# Patient Record
Sex: Female | Born: 1940
Health system: Southern US, Community
[De-identification: ages and names within clinical notes are randomized; demographics above are authoritative.]

## PROBLEM LIST (undated history)

## (undated) DIAGNOSIS — C4490 Unspecified malignant neoplasm of skin, unspecified: Secondary | ICD-10-CM

## (undated) DIAGNOSIS — M899 Disorder of bone, unspecified: Secondary | ICD-10-CM

## (undated) DIAGNOSIS — E669 Obesity, unspecified: Secondary | ICD-10-CM

## (undated) DIAGNOSIS — G459 Transient cerebral ischemic attack, unspecified: Secondary | ICD-10-CM

## (undated) DIAGNOSIS — E785 Hyperlipidemia, unspecified: Secondary | ICD-10-CM

## (undated) DIAGNOSIS — K5730 Diverticulosis of large intestine without perforation or abscess without bleeding: Secondary | ICD-10-CM

## (undated) DIAGNOSIS — K760 Fatty (change of) liver, not elsewhere classified: Secondary | ICD-10-CM

## (undated) DIAGNOSIS — K7689 Other specified diseases of liver: Secondary | ICD-10-CM

## (undated) DIAGNOSIS — F411 Generalized anxiety disorder: Secondary | ICD-10-CM

## (undated) DIAGNOSIS — I639 Cerebral infarction, unspecified: Secondary | ICD-10-CM

## (undated) DIAGNOSIS — R42 Dizziness and giddiness: Secondary | ICD-10-CM

## (undated) DIAGNOSIS — M949 Disorder of cartilage, unspecified: Secondary | ICD-10-CM

## (undated) DIAGNOSIS — M797 Fibromyalgia: Secondary | ICD-10-CM

## (undated) DIAGNOSIS — I2510 Atherosclerotic heart disease of native coronary artery without angina pectoris: Secondary | ICD-10-CM

## (undated) DIAGNOSIS — I10 Essential (primary) hypertension: Secondary | ICD-10-CM

## (undated) HISTORY — DX: Obesity, unspecified: E66.9

## (undated) HISTORY — DX: Hyperlipidemia, unspecified: E78.5

## (undated) HISTORY — DX: Atherosclerotic heart disease of native coronary artery without angina pectoris: I25.10

## (undated) HISTORY — PX: TOTAL ABDOMINAL HYSTERECTOMY W/ BILATERAL SALPINGOOPHORECTOMY: SHX83

## (undated) HISTORY — PX: BACK SURGERY: SHX140

## (undated) HISTORY — DX: Disorder of cartilage, unspecified: M94.9

## (undated) HISTORY — DX: Dizziness and giddiness: R42

## (undated) HISTORY — PX: MOHS SURGERY: SUR867

## (undated) HISTORY — DX: Unspecified malignant neoplasm of skin, unspecified: C44.90

## (undated) HISTORY — DX: Other specified diseases of liver: K76.89

## (undated) HISTORY — DX: Diverticulosis of large intestine without perforation or abscess without bleeding: K57.30

## (undated) HISTORY — DX: Generalized anxiety disorder: F41.1

## (undated) HISTORY — DX: Cerebral infarction, unspecified: I63.9

## (undated) HISTORY — DX: Fatty (change of) liver, not elsewhere classified: K76.0

## (undated) HISTORY — DX: Essential (primary) hypertension: I10

## (undated) HISTORY — DX: Disorder of bone, unspecified: M89.9

## (undated) HISTORY — DX: Fibromyalgia: M79.7

## (undated) HISTORY — DX: Transient cerebral ischemic attack, unspecified: G45.9

## (undated) HISTORY — PX: TONSILLECTOMY AND ADENOIDECTOMY: SUR1326

## (undated) HISTORY — PX: CHOLECYSTECTOMY: SHX55

---

## 1986-02-13 ENCOUNTER — Encounter: Payer: Self-pay | Admitting: Internal Medicine

## 1989-09-25 ENCOUNTER — Encounter: Payer: Self-pay | Admitting: Internal Medicine

## 1999-03-11 ENCOUNTER — Emergency Department (HOSPITAL_COMMUNITY): Admission: EM | Admit: 1999-03-11 | Discharge: 1999-03-11 | Payer: Self-pay | Admitting: Emergency Medicine

## 2003-07-31 ENCOUNTER — Ambulatory Visit (HOSPITAL_COMMUNITY): Admission: RE | Admit: 2003-07-31 | Discharge: 2003-07-31 | Payer: Self-pay | Admitting: *Deleted

## 2003-08-21 ENCOUNTER — Other Ambulatory Visit: Admission: RE | Admit: 2003-08-21 | Discharge: 2003-08-21 | Payer: Self-pay | Admitting: *Deleted

## 2004-06-09 ENCOUNTER — Ambulatory Visit: Payer: Self-pay | Admitting: Pulmonary Disease

## 2005-03-11 ENCOUNTER — Ambulatory Visit: Payer: Self-pay | Admitting: Pulmonary Disease

## 2005-03-30 ENCOUNTER — Ambulatory Visit: Payer: Self-pay | Admitting: Pulmonary Disease

## 2005-06-02 ENCOUNTER — Ambulatory Visit: Payer: Self-pay | Admitting: Pulmonary Disease

## 2006-01-25 ENCOUNTER — Ambulatory Visit: Payer: Self-pay | Admitting: Pulmonary Disease

## 2006-04-07 ENCOUNTER — Encounter: Payer: Self-pay | Admitting: Internal Medicine

## 2006-04-07 ENCOUNTER — Ambulatory Visit: Payer: Self-pay | Admitting: Internal Medicine

## 2006-04-07 ENCOUNTER — Observation Stay (HOSPITAL_COMMUNITY): Admission: EM | Admit: 2006-04-07 | Discharge: 2006-04-09 | Payer: Self-pay | Admitting: *Deleted

## 2006-04-08 ENCOUNTER — Encounter: Payer: Self-pay | Admitting: Vascular Surgery

## 2006-04-10 ENCOUNTER — Ambulatory Visit: Payer: Self-pay

## 2006-04-25 ENCOUNTER — Ambulatory Visit: Payer: Self-pay | Admitting: *Deleted

## 2006-05-03 ENCOUNTER — Ambulatory Visit: Payer: Self-pay | Admitting: Internal Medicine

## 2006-05-08 ENCOUNTER — Ambulatory Visit (HOSPITAL_COMMUNITY): Admission: RE | Admit: 2006-05-08 | Discharge: 2006-05-09 | Payer: Self-pay | Admitting: Internal Medicine

## 2006-05-08 ENCOUNTER — Ambulatory Visit: Payer: Self-pay | Admitting: Internal Medicine

## 2006-05-18 ENCOUNTER — Ambulatory Visit: Payer: Self-pay | Admitting: Internal Medicine

## 2006-06-14 ENCOUNTER — Ambulatory Visit: Payer: Self-pay | Admitting: Cardiology

## 2006-06-14 ENCOUNTER — Observation Stay (HOSPITAL_COMMUNITY): Admission: EM | Admit: 2006-06-14 | Discharge: 2006-06-15 | Payer: Self-pay | Admitting: Emergency Medicine

## 2006-06-21 ENCOUNTER — Ambulatory Visit: Payer: Self-pay | Admitting: Pulmonary Disease

## 2006-07-16 ENCOUNTER — Encounter: Admission: RE | Admit: 2006-07-16 | Discharge: 2006-07-16 | Payer: Self-pay | Admitting: Ophthalmology

## 2006-07-31 ENCOUNTER — Ambulatory Visit: Payer: Self-pay | Admitting: Internal Medicine

## 2006-09-25 ENCOUNTER — Observation Stay (HOSPITAL_COMMUNITY): Admission: EM | Admit: 2006-09-25 | Discharge: 2006-09-27 | Payer: Self-pay | Admitting: Emergency Medicine

## 2006-09-25 ENCOUNTER — Ambulatory Visit: Payer: Self-pay | Admitting: Cardiology

## 2006-09-27 ENCOUNTER — Emergency Department (HOSPITAL_COMMUNITY): Admission: EM | Admit: 2006-09-27 | Discharge: 2006-09-28 | Payer: Self-pay | Admitting: Emergency Medicine

## 2006-09-28 ENCOUNTER — Ambulatory Visit: Payer: Self-pay | Admitting: Pulmonary Disease

## 2006-11-27 ENCOUNTER — Ambulatory Visit: Payer: Self-pay | Admitting: Pulmonary Disease

## 2007-07-30 ENCOUNTER — Ambulatory Visit: Payer: Self-pay | Admitting: Pulmonary Disease

## 2007-07-30 DIAGNOSIS — F411 Generalized anxiety disorder: Secondary | ICD-10-CM | POA: Insufficient documentation

## 2007-07-30 DIAGNOSIS — E785 Hyperlipidemia, unspecified: Secondary | ICD-10-CM | POA: Insufficient documentation

## 2007-09-07 ENCOUNTER — Ambulatory Visit: Payer: Self-pay | Admitting: Pulmonary Disease

## 2007-09-07 DIAGNOSIS — M899 Disorder of bone, unspecified: Secondary | ICD-10-CM | POA: Insufficient documentation

## 2007-09-07 DIAGNOSIS — M949 Disorder of cartilage, unspecified: Secondary | ICD-10-CM

## 2007-09-08 DIAGNOSIS — R55 Syncope and collapse: Secondary | ICD-10-CM | POA: Insufficient documentation

## 2007-09-08 DIAGNOSIS — E669 Obesity, unspecified: Secondary | ICD-10-CM | POA: Insufficient documentation

## 2007-09-08 LAB — CONVERTED CEMR LAB
ALT: 52 units/L — ABNORMAL HIGH (ref 0–35)
Bilirubin, Direct: 0.1 mg/dL (ref 0.0–0.3)
Calcium: 9.3 mg/dL (ref 8.4–10.5)
Eosinophils Absolute: 0.1 10*3/uL (ref 0.0–0.6)
Eosinophils Relative: 2 % (ref 0.0–5.0)
GFR calc Af Amer: 92 mL/min
GFR calc non Af Amer: 76 mL/min
Glucose, Bld: 116 mg/dL — ABNORMAL HIGH (ref 70–99)
HDL: 41.1 mg/dL (ref >39.0)
Lymphocytes Relative: 27.5 % (ref 12.0–46.0)
MCV: 86.1 fL (ref 78.0–100.0)
Monocytes Relative: 7 % (ref 3.0–11.0)
Neutro Abs: 3.3 10*3/uL (ref 1.4–7.7)
Platelets: 211 10*3/uL (ref 150–400)
Potassium: 3.8 meq/L (ref 3.5–5.1)
TSH: 3.01 microintl units/mL (ref 0.35–5.50)
Triglycerides: 74 mg/dL (ref 0–149)
WBC: 5.3 10*3/uL (ref 4.5–10.5)

## 2007-09-10 ENCOUNTER — Encounter: Payer: Self-pay | Admitting: Pulmonary Disease

## 2007-09-10 ENCOUNTER — Ambulatory Visit: Payer: Self-pay | Admitting: Internal Medicine

## 2007-09-18 ENCOUNTER — Telehealth (INDEPENDENT_AMBULATORY_CARE_PROVIDER_SITE_OTHER): Payer: Self-pay | Admitting: *Deleted

## 2007-10-22 ENCOUNTER — Ambulatory Visit: Payer: Self-pay | Admitting: Internal Medicine

## 2007-11-05 ENCOUNTER — Ambulatory Visit: Payer: Self-pay | Admitting: Internal Medicine

## 2007-12-27 ENCOUNTER — Ambulatory Visit: Payer: Self-pay | Admitting: Internal Medicine

## 2008-03-03 ENCOUNTER — Telehealth (INDEPENDENT_AMBULATORY_CARE_PROVIDER_SITE_OTHER): Payer: Self-pay | Admitting: *Deleted

## 2008-03-19 ENCOUNTER — Ambulatory Visit: Payer: Self-pay | Admitting: Pulmonary Disease

## 2008-03-19 ENCOUNTER — Ambulatory Visit (HOSPITAL_COMMUNITY): Admission: RE | Admit: 2008-03-19 | Discharge: 2008-03-19 | Payer: Self-pay | Admitting: Pulmonary Disease

## 2008-03-19 ENCOUNTER — Telehealth: Payer: Self-pay | Admitting: Pulmonary Disease

## 2008-03-19 ENCOUNTER — Encounter: Payer: Self-pay | Admitting: Pulmonary Disease

## 2008-03-19 ENCOUNTER — Ambulatory Visit: Payer: Self-pay | Admitting: Vascular Surgery

## 2008-03-26 ENCOUNTER — Ambulatory Visit: Payer: Self-pay | Admitting: Pulmonary Disease

## 2008-04-28 ENCOUNTER — Ambulatory Visit: Payer: Self-pay | Admitting: Internal Medicine

## 2008-04-28 LAB — CONVERTED CEMR LAB
ALT: 42 units/L — ABNORMAL HIGH (ref 0–35)
Albumin: 3.8 g/dL (ref 3.5–5.2)
BUN: 13 mg/dL (ref 6–23)
Basophils Absolute: 0 10*3/uL (ref 0.0–0.1)
Basophils Relative: 0.3 % (ref 0.0–3.0)
Calcium: 8.9 mg/dL (ref 8.4–10.5)
Cholesterol: 99 mg/dL (ref 0–200)
Creatinine, Ser: 0.7 mg/dL (ref 0.4–1.2)
Eosinophils Absolute: 0.1 10*3/uL (ref 0.0–0.7)
Eosinophils Relative: 1.8 % (ref 0.0–5.0)
GFR calc non Af Amer: 89 mL/min
HCT: 41 % (ref 36.0–46.0)
HDL: 30.3 mg/dL — ABNORMAL LOW (ref >39.0)
Hemoglobin: 14 g/dL (ref 12.0–15.0)
MCHC: 34.2 g/dL (ref 30.0–36.0)
MCV: 85.7 fL (ref 78.0–100.0)
Neutro Abs: 2.9 10*3/uL (ref 1.4–7.7)
RBC: 4.78 M/uL (ref 3.87–5.11)
Total Bilirubin: 0.9 mg/dL (ref 0.3–1.2)
Triglycerides: 74 mg/dL (ref 0–149)
aPTT: 30.1 s — ABNORMAL HIGH (ref 21.7–29.8)

## 2008-04-29 ENCOUNTER — Ambulatory Visit (HOSPITAL_COMMUNITY): Admission: RE | Admit: 2008-04-29 | Discharge: 2008-04-29 | Payer: Self-pay | Admitting: Internal Medicine

## 2008-04-29 ENCOUNTER — Ambulatory Visit: Payer: Self-pay | Admitting: Internal Medicine

## 2008-05-21 ENCOUNTER — Ambulatory Visit: Payer: Self-pay | Admitting: Internal Medicine

## 2008-05-21 ENCOUNTER — Observation Stay (HOSPITAL_COMMUNITY): Admission: AD | Admit: 2008-05-21 | Discharge: 2008-05-22 | Payer: Self-pay | Admitting: Internal Medicine

## 2008-05-21 ENCOUNTER — Ambulatory Visit: Payer: Self-pay

## 2008-05-26 ENCOUNTER — Telehealth: Payer: Self-pay | Admitting: Pulmonary Disease

## 2008-05-30 ENCOUNTER — Telehealth: Payer: Self-pay | Admitting: Pulmonary Disease

## 2008-06-10 ENCOUNTER — Ambulatory Visit: Payer: Self-pay | Admitting: Internal Medicine

## 2008-06-10 ENCOUNTER — Telehealth (INDEPENDENT_AMBULATORY_CARE_PROVIDER_SITE_OTHER): Payer: Self-pay | Admitting: *Deleted

## 2008-06-18 ENCOUNTER — Ambulatory Visit: Payer: Self-pay | Admitting: Pulmonary Disease

## 2008-06-18 ENCOUNTER — Ambulatory Visit: Payer: Self-pay | Admitting: Internal Medicine

## 2008-07-22 ENCOUNTER — Telehealth (INDEPENDENT_AMBULATORY_CARE_PROVIDER_SITE_OTHER): Payer: Self-pay | Admitting: *Deleted

## 2008-07-24 ENCOUNTER — Encounter: Payer: Self-pay | Admitting: Adult Health

## 2008-07-24 ENCOUNTER — Ambulatory Visit: Payer: Self-pay | Admitting: Internal Medicine

## 2008-07-30 ENCOUNTER — Encounter: Payer: Self-pay | Admitting: Pulmonary Disease

## 2008-08-20 ENCOUNTER — Telehealth: Payer: Self-pay | Admitting: Pulmonary Disease

## 2008-11-06 ENCOUNTER — Ambulatory Visit: Payer: Self-pay | Admitting: Pulmonary Disease

## 2008-11-10 ENCOUNTER — Telehealth (INDEPENDENT_AMBULATORY_CARE_PROVIDER_SITE_OTHER): Payer: Self-pay | Admitting: *Deleted

## 2008-11-12 ENCOUNTER — Encounter: Admission: RE | Admit: 2008-11-12 | Discharge: 2008-11-12 | Payer: Self-pay | Admitting: Pulmonary Disease

## 2008-11-26 ENCOUNTER — Encounter (INDEPENDENT_AMBULATORY_CARE_PROVIDER_SITE_OTHER): Payer: Self-pay | Admitting: *Deleted

## 2008-12-15 ENCOUNTER — Ambulatory Visit: Payer: Self-pay | Admitting: Pulmonary Disease

## 2008-12-15 LAB — CONVERTED CEMR LAB
Albumin: 3.9 g/dL (ref 3.5–5.2)
BUN: 12 mg/dL (ref 6–23)
Basophils Absolute: 0 10*3/uL (ref 0.0–0.1)
CO2: 32 meq/L (ref 19–32)
Cholesterol: 141 mg/dL (ref 0–200)
Eosinophils Absolute: 0.1 10*3/uL (ref 0.0–0.7)
GFR calc non Af Amer: 75.85 mL/min (ref >60)
Glucose, Bld: 98 mg/dL (ref 70–99)
HCT: 41.9 % (ref 36.0–46.0)
HDL: 33.7 mg/dL — ABNORMAL LOW (ref >39.00)
Hemoglobin: 14.5 g/dL (ref 12.0–15.0)
Lymphs Abs: 1.3 10*3/uL (ref 0.7–4.0)
MCHC: 34.6 g/dL (ref 30.0–36.0)
Monocytes Absolute: 0.3 10*3/uL (ref 0.1–1.0)
Monocytes Relative: 6.7 % (ref 3.0–12.0)
Neutro Abs: 2.5 10*3/uL (ref 1.4–7.7)
Platelets: 177 10*3/uL (ref 150.0–400.0)
Potassium: 3.9 meq/L (ref 3.5–5.1)
Pro B Natriuretic peptide (BNP): 22 pg/mL (ref 0.0–100.0)
RDW: 12.9 % (ref 11.5–14.6)
Sodium: 144 meq/L (ref 135–145)
TSH: 3.71 microintl units/mL (ref 0.35–5.50)
Total Bilirubin: 1 mg/dL (ref 0.3–1.2)
VLDL: 18 mg/dL (ref 0.0–40.0)

## 2008-12-18 ENCOUNTER — Ambulatory Visit: Payer: Self-pay | Admitting: Internal Medicine

## 2009-01-05 ENCOUNTER — Encounter: Admission: RE | Admit: 2009-01-05 | Discharge: 2009-01-05 | Payer: Self-pay | Admitting: Otolaryngology

## 2009-01-06 LAB — CONVERTED CEMR LAB: Vit D, 25-Hydroxy: 24 ng/mL — ABNORMAL LOW (ref 30–89)

## 2009-02-16 ENCOUNTER — Ambulatory Visit: Payer: Self-pay | Admitting: Pulmonary Disease

## 2009-02-16 LAB — CONVERTED CEMR LAB
Bilirubin Urine: NEGATIVE
Hemoglobin, Urine: NEGATIVE
Nitrite: NEGATIVE
Total Protein, Urine: NEGATIVE mg/dL
Urobilinogen, UA: 0.2 (ref 0.0–1.0)

## 2009-02-17 ENCOUNTER — Encounter: Payer: Self-pay | Admitting: Adult Health

## 2009-02-18 ENCOUNTER — Telehealth: Payer: Self-pay | Admitting: Adult Health

## 2009-02-19 ENCOUNTER — Ambulatory Visit: Payer: Self-pay | Admitting: Adult Health

## 2009-02-19 ENCOUNTER — Ambulatory Visit (HOSPITAL_BASED_OUTPATIENT_CLINIC_OR_DEPARTMENT_OTHER): Admission: RE | Admit: 2009-02-19 | Discharge: 2009-02-19 | Payer: Self-pay | Admitting: Pulmonary Disease

## 2009-02-19 ENCOUNTER — Ambulatory Visit: Payer: Self-pay | Admitting: Diagnostic Radiology

## 2009-02-20 ENCOUNTER — Telehealth: Payer: Self-pay | Admitting: Adult Health

## 2009-02-20 LAB — CONVERTED CEMR LAB
ALT: 56 U/L — ABNORMAL HIGH
AST: 91 U/L — ABNORMAL HIGH
Albumin: 4.3 g/dL
Alkaline Phosphatase: 130 U/L — ABNORMAL HIGH
BUN: 11 mg/dL
Basophils Absolute: 0 K/uL
Basophils Relative: 0 %
Bilirubin, Direct: 0.1 mg/dL
CO2: 26 meq/L
Calcium: 9 mg/dL
Chloride: 107 meq/L
Creatinine, Ser: 0.75 mg/dL
Eosinophils Absolute: 0.1 K/uL
Eosinophils Relative: 2 %
Glucose, Bld: 88 mg/dL
HCT: 43.5 %
Hemoglobin: 14.2 g/dL
Indirect Bilirubin: 0.5 mg/dL
Lymphocytes Relative: 30 %
Lymphs Abs: 1.8 K/uL
MCHC: 32.6 g/dL
MCV: 90.2 fL
Monocytes Absolute: 0.4 K/uL
Monocytes Relative: 7 %
Neutro Abs: 3.5 K/uL
Neutrophils Relative %: 60 %
Platelets: 219 K/uL
Potassium: 4.3 meq/L
RBC: 4.82 M/uL
RDW: 13.6 %
Sed Rate: 22 mm/h
Sodium: 144 meq/L
Total Bilirubin: 0.6 mg/dL
Total Protein: 7 g/dL
WBC: 5.9 10*3/microliter

## 2009-02-24 ENCOUNTER — Ambulatory Visit (HOSPITAL_COMMUNITY): Admission: RE | Admit: 2009-02-24 | Discharge: 2009-02-24 | Payer: Self-pay | Admitting: Pulmonary Disease

## 2009-04-06 ENCOUNTER — Encounter: Payer: Self-pay | Admitting: Pulmonary Disease

## 2009-04-20 ENCOUNTER — Encounter: Payer: Self-pay | Admitting: Adult Health

## 2009-04-20 ENCOUNTER — Ambulatory Visit: Payer: Self-pay

## 2009-04-20 ENCOUNTER — Ambulatory Visit: Payer: Self-pay | Admitting: Pulmonary Disease

## 2009-04-21 ENCOUNTER — Encounter: Payer: Self-pay | Admitting: Adult Health

## 2009-06-17 ENCOUNTER — Encounter: Payer: Self-pay | Admitting: Pulmonary Disease

## 2009-06-17 ENCOUNTER — Ambulatory Visit: Payer: Self-pay | Admitting: Pulmonary Disease

## 2009-06-20 DIAGNOSIS — K7581 Nonalcoholic steatohepatitis (NASH): Secondary | ICD-10-CM | POA: Insufficient documentation

## 2009-06-20 DIAGNOSIS — K5730 Diverticulosis of large intestine without perforation or abscess without bleeding: Secondary | ICD-10-CM | POA: Insufficient documentation

## 2009-06-20 DIAGNOSIS — K7689 Other specified diseases of liver: Secondary | ICD-10-CM

## 2009-06-23 LAB — CONVERTED CEMR LAB
Albumin: 4 g/dL (ref 3.5–5.2)
BUN: 19 mg/dL (ref 6–23)
CO2: 31 meq/L (ref 19–32)
Calcium: 9.5 mg/dL (ref 8.4–10.5)
Cholesterol: 279 mg/dL — ABNORMAL HIGH (ref 0–200)
Creatinine, Ser: 0.8 mg/dL (ref 0.4–1.2)
Direct LDL: 208 mg/dL
Glucose, Bld: 118 mg/dL — ABNORMAL HIGH (ref 70–99)
HDL: 32.9 mg/dL — ABNORMAL LOW (ref >39.00)
Total Protein: 7.5 g/dL (ref 6.0–8.3)
Triglycerides: 178 mg/dL — ABNORMAL HIGH (ref 0.0–149.0)

## 2009-08-12 ENCOUNTER — Telehealth (INDEPENDENT_AMBULATORY_CARE_PROVIDER_SITE_OTHER): Payer: Self-pay | Admitting: *Deleted

## 2009-12-14 ENCOUNTER — Ambulatory Visit: Payer: Self-pay | Admitting: Pulmonary Disease

## 2009-12-16 ENCOUNTER — Ambulatory Visit: Payer: Self-pay | Admitting: Internal Medicine

## 2009-12-16 ENCOUNTER — Encounter: Payer: Self-pay | Admitting: Pulmonary Disease

## 2009-12-30 LAB — CONVERTED CEMR LAB
AST: 119 units/L — ABNORMAL HIGH (ref 0–37)
Alkaline Phosphatase: 120 units/L — ABNORMAL HIGH (ref 39–117)
BUN: 17 mg/dL (ref 6–23)
Basophils Relative: 0.3 % (ref 0.0–3.0)
Bilirubin, Direct: 0.2 mg/dL (ref 0.0–0.3)
Cholesterol: 154 mg/dL (ref 0–200)
Eosinophils Relative: 2.2 % (ref 0.0–5.0)
GFR calc non Af Amer: 70.51 mL/min (ref >60)
HCT: 43.3 % (ref 36.0–46.0)
LDL Cholesterol: 96 mg/dL (ref 0–99)
Lymphs Abs: 1.5 10*3/uL (ref 0.7–4.0)
MCV: 92.8 fL (ref 78.0–100.0)
Monocytes Absolute: 0.3 10*3/uL (ref 0.1–1.0)
Platelets: 186 10*3/uL (ref 150.0–400.0)
Potassium: 4.6 meq/L (ref 3.5–5.1)
Sodium: 142 meq/L (ref 135–145)
Total Bilirubin: 0.9 mg/dL (ref 0.3–1.2)
Total CHOL/HDL Ratio: 4
WBC: 5.1 10*3/uL (ref 4.5–10.5)

## 2009-12-31 ENCOUNTER — Telehealth: Payer: Self-pay | Admitting: Pulmonary Disease

## 2010-01-07 ENCOUNTER — Ambulatory Visit: Payer: Self-pay | Admitting: Cardiology

## 2010-03-24 ENCOUNTER — Encounter: Payer: Self-pay | Admitting: Pulmonary Disease

## 2010-06-15 ENCOUNTER — Ambulatory Visit: Payer: Self-pay | Admitting: Pulmonary Disease

## 2010-06-22 LAB — CONVERTED CEMR LAB
AST: 53 units/L — ABNORMAL HIGH (ref 0–37)
Alkaline Phosphatase: 115 units/L (ref 39–117)
Calcium: 9 mg/dL (ref 8.4–10.5)
Cholesterol: 270 mg/dL — ABNORMAL HIGH (ref 0–200)
GFR calc non Af Amer: 78.92 mL/min (ref >60.00)
HDL: 38.5 mg/dL — ABNORMAL LOW (ref >39.00)
Potassium: 4.3 meq/L (ref 3.5–5.1)
Sodium: 142 meq/L (ref 135–145)
Total Bilirubin: 0.8 mg/dL (ref 0.3–1.2)
Total CHOL/HDL Ratio: 7
VLDL: 29 mg/dL (ref 0.0–40.0)

## 2010-06-24 ENCOUNTER — Ambulatory Visit: Admission: RE | Admit: 2010-06-24 | Discharge: 2010-06-24 | Payer: Self-pay | Source: Home / Self Care

## 2010-06-29 ENCOUNTER — Telehealth (INDEPENDENT_AMBULATORY_CARE_PROVIDER_SITE_OTHER): Payer: Self-pay | Admitting: *Deleted

## 2010-07-12 ENCOUNTER — Encounter: Payer: Self-pay | Admitting: Pulmonary Disease

## 2010-07-20 ENCOUNTER — Other Ambulatory Visit: Payer: Self-pay | Admitting: Pulmonary Disease

## 2010-07-20 ENCOUNTER — Ambulatory Visit: Admission: RE | Admit: 2010-07-20 | Discharge: 2010-07-20 | Payer: Self-pay | Source: Home / Self Care

## 2010-07-20 LAB — HEPATIC FUNCTION PANEL
Albumin: 3.8 g/dL (ref 3.5–5.2)
Total Protein: 6.6 g/dL (ref 6.0–8.3)

## 2010-07-20 LAB — LIPID PANEL
Cholesterol: 210 mg/dL — ABNORMAL HIGH (ref 0–200)
HDL: 37.1 mg/dL — ABNORMAL LOW (ref >39.00)
Triglycerides: 190 mg/dL — ABNORMAL HIGH (ref 0.0–149.0)
VLDL: 38 mg/dL (ref 0.0–40.0)

## 2010-07-20 LAB — LDL CHOLESTEROL, DIRECT: Direct LDL: 152.4 mg/dL

## 2010-07-20 NOTE — Progress Notes (Signed)
Summary: [facility]   Imported By: Sherian Rein 03/01/2010 09:48:16  _____________________________________________________________________  External Attachment:    Type:   Image     Comment:   External Document

## 2010-07-20 NOTE — Miscellaneous (Signed)
Summary: BONE DENSITY  Clinical Lists Changes  Orders: Added new Test order of T-Bone Densitometry (77080) - Signed Added new Test order of T-Lumbar Vertebral Assessment (77082) - Signed 

## 2010-07-20 NOTE — Miscellaneous (Signed)
Summary: Fluv[facility]in/[facility]  [facility]   Imported By: Lester [place] 03/30/2010 08:50:08  _____________________________________________________________________  External Attachment:    Type:   Image     Comment:   External Document

## 2010-07-20 NOTE — Progress Notes (Signed)
Summary: lab work  Phone Note Call from Patient Call back at [employer] Daughter of pt   Caller: Daughter-[name] Call For: Dr. Kriste Basque Summary of Call: [phone] [name]-pt's daughter Called pt to give her appt for lipid clinic of 02/10/10 at 12:00. Daughter got on the phone wanting to know pt's lab information and had questions about her moms diagnois of Fatty Liver Disease and reason for lipid referral. Daughter is requesting a call from nurse.  Initial call taken by: Alfonso Ramus,  December 31, 2009 1:12 PM  Follow-up for Phone Call        called and spoke with Tanya--pts daughter---she had questions about pts lab tests and pt had told her that they were going to do a liver biopsy.  SN did a phonetree message and this is what they pt told her daughter they wanted to do.  SN did not say anything to me about this when he asked me to call pt to discuss the lipid clinic referral.  Tanya is aware that pt is to follow up with GI and the lipid clinic for dosing of her simvastatin 80mg   1/2 tab at bedtime. Randell Loop CMA  December 31, 2009 2:19 PM

## 2010-07-20 NOTE — Assessment & Plan Note (Signed)
Summary: rov 6 months///kp   CC:  6 month ROV & review of mult medical problems....  History of Present Illness: 70 y/o WF here for a follow up visit... she has mult med problems as noted below... Followed for general medical purposes w/ hx HBP, CAD & hx Syncope followed by Tanya, Hyperlipidemia, Obesity, Fatty Liver disease, LBP/ FM/ Osteopenia, & anxiety...   ~  June 17, 2009:  c/o scratchy throat & wants ZPak,  nose bleeds intermittently & we discussed nasal saline lavage, some left heel pain- needs heel cup, warm soaks, etc... tripped in Nov w/ scrape & mild cellulitis Rx'd w/ Keflex, ven dopplers were neg... she stopped her Simva80 in Sep due to ?abd pain & due for f/u FLP today- may need Lipid Clinic referral for Rx.   ~  December 14, 2009:  she reports a good 6 months- no new complaints or concerns... BP controlled on Lasix & she denies CP, palpit, SOB, edema, etc... she notes dizziness if she tilts her head back "it's cardio-depressor dizziness" she says- no syncope... she decided to re-start her Simva80 & never went to the Lipid Clinic> FLP improved on this but LFT's elevated (FLD) & she will need assessment in the clinic + f/u w/ GI...    Current Problems:   HYPERTENSION (ICD-401.9) - off Norvasc & Diltiazem, on LASIX 20mg /d, K55mEqBid...BP=140/80 today and she denies HA, fatigue, visual changes, CP, dyspnea, edema, etc... she notes occas palpitations...   CORONARY ARTERY DISEASE (ICD-414.00) - hx non-obstructive CAD followed by [name]... she denies CP, palpit, ch in SOB, edema, etc...  ~  cath 1996 showed 20-30% lesions in the Circ & RCA, EF=60%...  ~  NuclearStressTest 10/07 showed no scar or ischemia, ?EF=37%?...  ~  2DEcho 10/07 showed no regional wall motion changes, EF=60-65%... valves OK, some DD noted.  ~  repeat cath 12/09 showed similar findings w/ nonobstructive plaque, EF= 65%...  Hx of SYNCOPE (ICD-780.2) - hosp in 2007 for syncope and no etiology  discovered... implanted loop recorder placed for 18 months w/o events, then 2 spells after the recorder was removed!!!... rehosp 4/08 w/ neg studies at that time too... repeat eval 11/09 & nothing found...   ~  CDopplers 10/07 showed mild plaque, 0-39% bilat ICA...  ~  neg TiltTable study 4/08...  ~  6/10: notes occas palpit, dizzy- uses Meclizine Prn... saw Tanya 7/10- rec diet + exerc.  HYPERLIPIDEMIA (ICD-272.4) - on SIMVASTATIN 80mg /d- she takes it on & off> asked to stop Simva80 & f/u w/ Lipid Clinic... we discussed diet + exercise as well...  ~  FLP 3/09 showed TChol 139, TG 74, HDL 41, LDL 83... continue med + diet therapy...   ~  FLP 11/09 showed TChol 99, TG 74, HDL 30, LDL 54  ~  FLP 6/10 on Simva80 showed TChol 141, TG 90, HDL 34, LDL 89... note: LFT's sl elevated...  ~  FLP 12/10 off Simva showed TChol 279, TG 178, HDL 33, LDL 208... rec> refer to Lipid Clinic (she never went).  ~  FLP 6/11 back on Simva80 showed TChol 154, TG 107, HDL 37, LDL 96... needs LC eval to ch med.  OBESITY (ICD-278.00) - We discussed diet + exercise program... she states that [name] says you can gain weight while hardly eating any food & she thinks she isn't eating enough food...   ~  weight 12/09 = 212#  ~  weight 6/10 = 219#  ~  weight 12/10 = 217#  ~  weight 6/11 = 215#  DIVERTICULOSIS OF COLON (ICD-562.10) - she needs f/u eval [name]- ?when was last colonoscopy?  ~  CT Abd 9/10  done for Abd pain- showed divertics & hep steatosis, retained stool & DJD sp...  FATTY LIVER DISEASE (ICD-571.8) - hx elevated LFT's x yrs and eval [name] in past w/ rec to diet/ exerc/ weight reduction...  ~  labs 6/10 showed SGOT= 89, SGPT= 65  ~  labs 12/10 showed SGOT= 65, SGPT= 54  ~  labs 6/11 showed SGOT= 119, SGPT= 76... needs re-eval by GI  LOW BACK PAIN, CHRONIC (ICD-724.2) - s/p lumbar surg in 1989 by [name]...  FIBROMYALGIA (ICD-729.1)  OSTEOPENIA (ICD-733.90) - ?prev BMD from GYN... on Calcium,  MVI, VitD...  ~  BMD here 3/09 showed TScores -0.7 in Spine, and -1.6 in left FemNeck  ~  6/11:  f/u BMD due & we will sched.  DIZZINESS (ICD-780.4) - she takes MECLIZINE 25mg  Bid...  ~  s/p ENT eval by [name] 2/08 felt to be benign positional vertigo, Rx w/ meclizine...   ~  she cancelled a planned neuro evaluation 4/08...  ANXIETY (ICD-300.00) - on XANAX 0.5mg Tid (takes 1AM & 2HS)...  SKIN CANCER - s/o Moh's surg for BCCa left cheek...  HEALTH MAINT--- GYN= prev [name] and needs new Gyn now... mammograms at [name]... had neg FlexSig 1991 DrBrodie and needs routine colonoscopy scheduled... states she uses olive oil on her dry skin per [name]... had Pneumovax 2008, & 2010 Flu shot in Oct.   Preventive Screening-Counseling & Management  Alcohol-Tobacco     Smoking Status: never  Current Medications (verified): 1)  Simply Saline 0.9 % Aers (Saline) .... Spray in Nose Every 1-2 Hours As Directed... 2)  Mucinex Maximum Strength 1200 Mg Xr12h-Tab (Guaifenesin) .... Take 1 Tab By Mouth Two Times A Day W/ Plenty of Fluids.Marland KitchenMarland Kitchen 3)  Bayer Aspirin 325 Mg  Tabs (Aspirin) .... Take 1 Tablet By Mouth Once A Day 4)  Lasix 20 Mg  Tabs (Furosemide) .... Take 1 Tablet By Mouth Once A Day 5)  Klor-Con M20 20 Meq Cr-Tabs (Potassium Chloride Crys Cr) .... Take One Tablet By Mouth Two Times A Day 6)  Zocor 80 Mg  Tabs (Simvastatin) .... Take 1 Tablet By Mouth At Bedtime.Marland KitchenMarland Kitchen 7)  Fish Oil 1000 Mg Caps (Omega-3 Fatty Acids) .... Take 1 Cap By Mouth Once Daily.Marland KitchenMarland Kitchen 8)  Levsin 0.125 Mg Tabs (Hyoscyamine Sulfate) .Marland Kitchen.. 1 Three Times A Day As Needed Abdominal Cramping 9)  Caltrate 600+d Plus 600-400 Mg-Unit Tabs (Calcium Carbonate-Vit D-Min) .... Take 1 Tab By Mouth Once Daily... 10)  Womens Multivitamin Plus  Tabs (Multiple Vitamins-Minerals) .... Take 1 Tab Daily... 11)  Vitamin D 1000 Unit Tabs (Cholecalciferol) .... Take 1 Tablet By Mouth Once A Day 12)  Alprazolam 0.5 Mg  Tabs (Alprazolam) .... Take 1/2 To 1  Tablet By Mouth Three Times A Day As Needed For Nerves... Not To Exceed 3 Tabs Per Day.  Allergies: 1)  ! Sulfa 2)  ! Codeine 3)  ! Cardizem Cd (Diltiazem Hcl Coated Beads)  Past History:  Past Medical History: HYPERTENSION (ICD-401.9) CORONARY ARTERY DISEASE (ICD-414.00) Hx of SYNCOPE (ICD-780.2) HYPERLIPIDEMIA (ICD-272.4) OBESITY (ICD-278.00) DIVERTICULOSIS OF COLON (ICD-562.10) FATTY LIVER DISEASE (ICD-571.8) LOW BACK PAIN, CHRONIC (ICD-724.2) FIBROMYALGIA (ICD-729.1) OSTEOPENIA (ICD-733.90) DIZZINESS (ICD-780.4) ANXIETY (ICD-300.00)  Past Surgical History: S/P T & A S/P hysterectomy & BSO S/P cholecystectomy - 4/91 by Hollace Kinnier S/P back surgery - L5 S1 diskectomy & foraminotomy 2/89 by [name] S/P  hand surgery for Dupytren's contracture S/P Moh's surg for basal cell on left cheek 2/10 DrLeschin  Family History: Reviewed history from 12/17/2008 and no changes required. mother deceased age 64 from stomach cancer father deceased from pneumonia--pt was only 3 when father passed--unsure of his age at the time Brother passed age 93 -Merkel cell carcinoma 1 sister alive hx of breast cancer, stomach cancer, uterine cancer 1 sister alive hx of melanoma  Social History: Reviewed history from 12/17/2008 and no changes required. never smoked never drinks exposed to second hand smoke exercise---walks 1 times per week caffenine use--1 cup per day married x 53 years 4 children  Review of Systems      See HPI       The patient complains of dyspnea on exertion and difficulty walking.  The patient denies anorexia, fever, weight loss, weight gain, vision loss, decreased hearing, hoarseness, chest pain, syncope, peripheral edema, prolonged cough, headaches, hemoptysis, abdominal pain, melena, hematochezia, severe indigestion/heartburn, hematuria, incontinence, muscle weakness, suspicious skin lesions, transient blindness, depression, unusual weight change, abnormal bleeding,  enlarged lymph nodes, and angioedema.    Vital Signs:  Patient profile:   70 year old female Height:      63 inches Weight:      215 pounds BMI:     38.22 Temp:     97.3 degrees F oral Pulse rate:   76 / minute BP sitting:   140 / 80  Vitals Entered By: Kandice Hams CMA (December 14, 2009 10:50 AM) CC: 6 month ROV & review of mult medical problems...   Physical Exam  Additional Exam:   WD, Obese, 70 y/o WF in NAD... GENERAL:  Alert & oriented; pleasant & cooperative... HEENT:  [place]/AT, EOM-wnl,  PERRLA, EACs- wax, TMs-wnl, NOSE-pale, THROAT-clear & wnl. NECK:  Supple w/ fairROM; no JVD; normal carotid impulses w/o bruits; no thyromegaly or nodules palpated; no lymphadenopathy. CHEST:  Clear BS bilaterally without wheezing, rales, or signs of consolidation... HEART:  Regular Rhythm; without murmurs/ rubs/ or gallops heard... ABDOMEN:  Soft & nontender; normal bowel sounds; no organomegaly or masses detected. EXT: without deformities, mild arthritic changes; no varicose veins/ +venous insuffic/ tr edema... NEURO:  CN's intact, no focal neuro deficits... DERM:  scar on left cheek, no lesions noted...    MISC. Report  Procedure date:  12/14/2009  Findings:      BMP (METABOL)   Sodium                    142 mEq/L                   135-145   Potassium                 4.6 mEq/L                   3.5-5.1   Chloride                  105 mEq/L                   96-112   Carbon Dioxide            29 mEq/L                    19-32   Glucose              [H]  103 mg/dL  70-99   BUN                       17 mg/dL                    1-30   Creatinine                0.9 mg/dL                   8.6-5.7   Calcium                   9.2 mg/dL                   8.4-69.6   GFR                       70.51 mL/min                >60  Hepatic/Liver Function Panel (HEPATIC)   Total Bilirubin           0.9 mg/dL                   2.9-5.2   Direct Bilirubin          0.2 mg/dL                    8.4-1.3   Alkaline Phosphatase [H]  120 U/L                     39-117   AST                  [H]  119 U/L                     0-37   ALT                  [H]  76 U/L                      0-35   Total Protein             7.1 g/dL                    2.4-4.0   Albumin                   4.2 g/dL                    1.0-2.7  CBC Platelet w/Diff (CBCD)   White Cell Count          5.1 K/uL                    4.5-10.5   Red Cell Count            4.66 Mil/uL                 3.87-5.11   Hemoglobin           [H]  15.1 g/dL                   25.3-66.4   Hematocrit                43.3 %  36.0-46.0   MCV                       92.8 fl                     78.0-100.0   Platelet Count            186.0 K/uL                  150.0-400.0  Comments:      Lipid Panel (LIPID)   Cholesterol               154 mg/dL                   0-102   Triglycerides             107.0 mg/dL                 7.2-536.6   HDL                  [L]  44.03 mg/dL                 >47.42   LDL Cholesterol           96 mg/dL                    5-95      TSH (TSH)   FastTSH                   3.27 uIU/mL                 0.35-5.50   Impression & Recommendations:  Problem # 1:  HYPERTENSION (ICD-401.9) Controlled on med>  continue same. Her updated medication list for this problem includes:    Lasix 20 Mg Tabs (Furosemide) .Marland Kitchen... Take 1 tablet by mouth once a day  Orders: TLB-BMP (Basic Metabolic Panel-BMET) (80048-METABOL) TLB-Hepatic/Liver Function Pnl (80076-HEPATIC) TLB-CBC Platelet - w/Differential (85025-CBCD) TLB-Lipid Panel (80061-LIPID) TLB-TSH (Thyroid Stimulating Hormone) (84443-TSH)  Problem # 2:  CORONARY ARTERY DISEASE (ICD-414.00) Followed for Cards by Tanya>  stable w/o angina... Her updated medication list for this problem includes:    Bayer Aspirin 325 Mg Tabs (Aspirin) .Marland Kitchen... Take 1 tablet by mouth once a day    Lasix 20 Mg Tabs (Furosemide) .Marland Kitchen... Take 1 tablet by mouth once a  day  Problem # 3:  Hx of SYNCOPE (ICD-780.2) No recurrent episodes but she describes dizzy w/ bending her head back...   Problem # 4:  HYPERLIPIDEMIA (ICD-272.4) Back on simva80 & Chol is improved... elev LFT's from fatty liver dis... needs eval Lipid Clinic for alternative Rx. Her updated medication list for this problem includes:    Zocor 80 Mg Tabs (Simvastatin) .Marland Kitchen... Take 1 tablet by mouth at bedtime...  Problem # 5:  OBESITY (ICD-278.00) We discussed diet exercise get weight down!!!  Problem # 6:  FATTY LIVER DISEASE (ICD-571.8) Her numbers are worse... hasn't lost any weight... needs GI f/u and discussion of prognosis if she doesn't lose weight w/ this condition.  Problem # 7:  ANXIETY (ICD-300.00) Stable on Alpraz... Her updated medication list for this problem includes:    Alprazolam 0.5 Mg Tabs (Alprazolam) .Marland Kitchen... Take 1/2 to 1 tablet by mouth three times a day as needed for nerves... not to exceed 3 tabs per day.  Problem # 8:  OTHER MEDICAL PROBLEMS AS NOTED>>>  Complete Medication  List: 1)  Simply Saline 0.9 % Aers (Saline) .... Spray in nose every 1-2 hours as directed... 2)  Mucinex Maximum Strength 1200 Mg Xr12h-tab (Guaifenesin) .... Take 1 tab by mouth two times a day w/ plenty of fluids.Marland KitchenMarland Kitchen 3)  Bayer Aspirin 325 Mg Tabs (Aspirin) .... Take 1 tablet by mouth once a day 4)  Lasix 20 Mg Tabs (Furosemide) .... Take 1 tablet by mouth once a day 5)  Klor-con M20 20 Meq Cr-tabs (Potassium chloride crys cr) .... Take one tablet by mouth two times a day 6)  Zocor 80 Mg Tabs (Simvastatin) .... Take 1 tablet by mouth at bedtime.Marland KitchenMarland Kitchen 7)  Fish Oil 1000 Mg Caps (Omega-3 fatty acids) .... Take 1 cap by mouth once daily.Marland KitchenMarland Kitchen 8)  Levsin 0.125 Mg Tabs (Hyoscyamine sulfate) .Marland Kitchen.. 1 three times a day as needed abdominal cramping 9)  Caltrate 600+d Plus 600-400 Mg-unit Tabs (Calcium carbonate-vit d-min) .... Take 1 tab by mouth once daily... 10)  Womens Multivitamin Plus Tabs (Multiple  vitamins-minerals) .... Take 1 tab daily... 11)  Vitamin D 1000 Unit Tabs (Cholecalciferol) .... Take 1 tablet by mouth once a day 12)  Alprazolam 0.5 Mg Tabs (Alprazolam) .... Take 1/2 to 1 tablet by mouth three times a day as needed for nerves... not to exceed 3 tabs per day.  Other Orders: Gastroenterology Referral (GI)  Patient Instructions: 1)  Today we updated your med list- see below.... 2)  Continue your current meds the same... 3)  Today we refilled your Xanax per request... 4)  We also did your follow up FASTING blood work... please call the "phone tree" in a few days for your lab results.Marland KitchenMarland Kitchen 5)  We will arrange for a follow up Bone Density test as well- & we will call you w/ these results when avail.Marland KitchenMarland Kitchen 6)  Let's get on track w/ our diet & exercise program... 7)  Call for any problems... 8)  Please schedule a follow-up appointment in 6 months. Prescriptions: ALPRAZOLAM 0.5 MG  TABS (ALPRAZOLAM) take 1/2 to 1 tablet by mouth three times a day as needed for nerves... not to exceed 3 tabs per day.  #100 x 6   Entered and Authorized by:   Michele Mcalpine MD   Signed by:   Michele Mcalpine MD on 12/14/2009   Method used:   Print then Give to Patient   RxID:   [id number]    Orders Added: 1)  Est. Patient Level IV [14782] 2)  TLB-BMP (Basic Metabolic Panel-BMET) [80048-METABOL] 3)  TLB-Hepatic/Liver Function Pnl [80076-HEPATIC] 4)  TLB-CBC Platelet - w/Differential [85025-CBCD] 5)  TLB-Lipid Panel [80061-LIPID] 6)  TLB-TSH (Thyroid Stimulating Hormone) [84443-TSH] 7)  Gastroenterology Referral [GI]   Current Allergies (reviewed today): ! SULFA ! CODEINE ! CARDIZEM CD (DILTIAZEM HCL COATED BEADS)

## 2010-07-20 NOTE — Progress Notes (Signed)
Summary: rx req/ sinus/ congestion  Phone Note Call from Patient Call back at [facility] Phone [phone]   Caller: Patient Call For: nadel Summary of Call: pt c/o drainage in throat/ hoarse x 1 day. temp today is 100. also has chills/ sinus congestion. had stomach virus/ diarrhea/ vomiting 2 wks ago but is over that now. using musinex and ibuprophen. wants rx called in to [facility].  Initial call taken by: Tivis Ringer, CNA,  August 12, 2009 11:29 AM  Follow-up for Phone Call        Pt c/o dry cough, PND, hoarseness, temp of 100.2, chills, nasal and chest cogestion all x 2 days. Pt has been using mucinex and ibuprofen. Please advise. Carron Curie CMA  August 12, 2009 11:35 AM allergies: sulfa, codeine, cardizem  Additional Follow-up for Phone Call Additional follow up Details #1::        per SN---ok for pt to have zpak #1  take as directed and mmw  #4oz  1 tsp gargle and swallow four times daily.  thanks Randell Loop CMA  August 12, 2009 3:31 PM   rx's sent to pt's pharmacy.  pt is aware, encouraged her to call if she does not improve.  pt verbalized her understanding. Boone Master CNA  August 12, 2009 3:43 PM     New/Updated Medications: ZITHROMAX Z-PAK 250 MG TABS (AZITHROMYCIN) take as directed * MAGIC MOUTHWASH swish and swallow 1tsp four times a day Prescriptions: MAGIC MOUTHWASH swish and swallow 1tsp four times a day  #4oz x 0   Entered by:   Boone Master CNA   Authorized by:   Michele Mcalpine MD   Signed by:   Boone Master CNA on 08/12/2009   Method used:   Faxed to ...       [facility]* (retail)       [address] [place], [place]  [address]       Ph: [phone]       Fax: [phone]   RxID:   [id number] Christena Deem Z-PAK 250 MG TABS (AZITHROMYCIN) take as directed  #1 x 0   Entered by:   Boone Master CNA   Authorized by:   Michele Mcalpine MD   Signed by:   Boone Master CNA on 08/12/2009   Method  used:   Electronically to        [facility]* (retail)       [address] [place], [place]  [address]       Ph: [phone]       Fax: [phone]   RxID:   [id number]

## 2010-07-20 NOTE — Progress Notes (Signed)
Summary: [facility]   Imported By: Sherian Rein 03/01/2010 09:46:51  _____________________________________________________________________  External Attachment:    Type:   Image     Comment:   External Document

## 2010-07-20 NOTE — Assessment & Plan Note (Signed)
Summary: new to lipid/hyperlipidemia   Visit Type:  Initial Consult Primary Provider:  Kriste Basque  CC:  dyslipidemia; increased LFTs .  History of Present Illness:  Lipid Clinic Visit      Mrs. Greenstein presents today for initial dyslipidemia visit for elevated LFTs.  Patient is currently taking Fish Oil 1000mg  daily and simvastatin 40mg .  She was previousy on simvastatin 80mg , however her LFTs were elevated in June 2011 and she was decreased to simvastatin 40mg  daily.  She has a history of fluctuations in her LFTs as well as a history of fatty liver disease.  In September 2010, her simvastatin was held due to increased LFTs, and her LDL increased to 208 in December 2010.  She was not taking simvastatin for 3 months at this time and her LFTs did improve.  She denies any muscle pains or aches associated with the simvastatin, however, she also has fibromyalgia.  She is mostly following a heart healthy diet.  For breakfast she typically has cheerios with 2% milk.  She eats a total of three eggs per week and she will have toast with them.  For lunch, she typically eats steamed vegetables or a salad.  For dinner, she typically eats vegetables, including starches.  She does not eat a lot of meat because her husband is a vegetarian.  He is also diabetic, so she tries to maintain a healthy diet for his benefit as well.  She drinks tea and water, and will occassionally have a sprite or ginger ale.  She snacks occassionally and will have a pack of crackers with cheese.  She denies tobacco or alcohol use.  Review of exercise habits reveals that the patient is walking 30 minutes per day 3 times a week. She is going to the beach next week, but has plans to begin participating in the [employer] program after she returns.   Patient states that she has struggled with her weight.  Last year, she lost 22 pounds, but has since gained it back.     Lipid Management Provider  Weston Brass, PharmD and Dillard Cannon, PharmD  candidate  Current Medications (verified): 1)  Simply Saline 0.9 % Aers (Saline) .... Spray in Nose Every 1-2 Hours As Directed... 2)  Bayer Aspirin 325 Mg  Tabs (Aspirin) .... Take 1 Tablet By Mouth Once A Day 3)  Lasix 20 Mg  Tabs (Furosemide) .... Take 1 Tablet By Mouth Once A Day 4)  Klor-Con M20 20 Meq Cr-Tabs (Potassium Chloride Crys Cr) .... Take One Tablet By Mouth Two Times A Day 5)  Zocor 80 Mg  Tabs (Simvastatin) .... Take 1/2  Tablet By Mouth At Bedtime.Marland KitchenMarland Kitchen 6)  Fish Oil 1000 Mg Caps (Omega-3 Fatty Acids) .... Take 1 Cap By Mouth Once Daily.Marland KitchenMarland Kitchen 7)  Caltrate 600+d Plus 600-400 Mg-Unit Tabs (Calcium Carbonate-Vit D-Min) .... Take 1 Tab By Mouth Once Daily.Marland KitchenMarland Kitchen 8)  Womens Multivitamin Plus  Tabs (Multiple Vitamins-Minerals) .... Take 1 Tab Daily.Marland KitchenMarland Kitchen 9)  Alprazolam 0.5 Mg  Tabs (Alprazolam) .... Take 1/2 To 1 Tablet By Mouth Three Times A Day As Needed For Nerves... Not To Exceed 3 Tabs Per Day.  Allergies (verified): 1)  ! Sulfa 2)  ! Codeine 3)  ! Cardizem Cd (Diltiazem Hcl Coated Beads)  Past History:  Past Medical History: Last updated: 12/14/2009 HYPERTENSION (ICD-401.9) CORONARY ARTERY DISEASE (ICD-414.00) Hx of SYNCOPE (ICD-780.2) HYPERLIPIDEMIA (ICD-272.4) OBESITY (ICD-278.00) DIVERTICULOSIS OF COLON (ICD-562.10) FATTY LIVER DISEASE (ICD-571.8) LOW BACK PAIN, CHRONIC (ICD-724.2) FIBROMYALGIA (ICD-729.1) OSTEOPENIA (ICD-733.90) DIZZINESS (  ICD-780.4) ANXIETY (ICD-300.00)  Family History: Last updated: 2010-01-17 mother deceased age 43 from stomach cancer father deceased from pneumonia--pt was only 3 when father passed--unsure of his age at the time Brother passed age 21 -Merkel cell carcinoma 1 sister alive hx of breast cancer, stomach cancer, uterine cancer 1 sister alive hx of melanoma  Family History: mother deceased age 59 from stomach cancer father deceased from pneumonia--pt was only 3 when father passed--unsure of his age at the time Brother passed age 64  -Merkel cell carcinoma 1 sister alive hx of breast cancer, stomach cancer, uterine cancer 1 sister alive hx of melanoma   Vital Signs:  Patient profile:   70 year old female Height:      63 inches Weight:      217 pounds BMI:     38.58 BP sitting:   128 / 80  (right arm)  Impression & Recommendations:  Problem # 1:  HYPERLIPIDEMIA (ICD-272.4) Patient's lipid levels are as follows: TC 154 (at goal<200) TG 107 (at goal<150) HDL 36.5 (below goal>40) LDL 96 (above goal <70)  AST 119 (above goal<37) ALT 76 (above goal<35)  Patient's TC and TG levels are at goal and her LDL is slightly elevated.  She appears to be following a heart healthy diet overall and was encouraged to continue this.  She expressed interest in the [employer] program and is to start this after she returns from the beach in approximately two weeks. Since LFTs are  ~3x WLN, we will hold simvastatin for 1 month and check her liver function panel.  If her LFTs have returned to normal or baseline for the pt, we will try Pravastatin 80mg  daily and re-check her liver function panel after one month of this therapy.  If LFTs are elevated after trial of second statin, will need to consider alternate drug class for treatment.  We will follow up with patient in 1 month to assess liver function panel.     Her updated medication list for this problem includes:    Zocor 80 Mg Tabs (Simvastatin) .Marland Kitchen... Take 1/2  tablet by mouth at bedtime...  Patient Instructions: 1)  Stop Simvastatin 2)  Try to keep eating a low-fat, low-cholesterol diet 3)  Start [employer] exercise program 4)  We will recheck your labs in 1 month 5)  Lab Appt: 02/08/10 at 8:45 am (fasting) 6)  Lipid Clinic Appt: 02/11/10 at 11:00

## 2010-07-22 ENCOUNTER — Ambulatory Visit: Admit: 2010-07-22 | Payer: Self-pay

## 2010-07-22 ENCOUNTER — Encounter: Payer: Self-pay | Admitting: Cardiology

## 2010-07-22 ENCOUNTER — Ambulatory Visit (INDEPENDENT_AMBULATORY_CARE_PROVIDER_SITE_OTHER): Payer: Medicare Other

## 2010-07-22 DIAGNOSIS — Z79899 Other long term (current) drug therapy: Secondary | ICD-10-CM

## 2010-07-22 DIAGNOSIS — E7800 Pure hypercholesterolemia, unspecified: Secondary | ICD-10-CM

## 2010-07-22 NOTE — Assessment & Plan Note (Signed)
Summary: follow-up per Dr Nadel/ewj       [name] presents today for dyslipidemia follow-up.  She was first seen last July after having elevated  LFTs with simvastatin.   We stopped the statin at that time and planned f/u for the next month.  Unfortunately, she did not return to clinic until today.  She has only been taking fish oil 1gm since last visit. She does state that she feels much better since stopping simvastatin.          She is mostly following a heart healthy diet.  For breakfast she typically has cheerios or frosted flakes with 2% milk or oatmeal.  Lunch is sometimes leftovers from the night before or egg sandwiches.  For dinner, she typically eats vegetables, including starches.  She does not eat a lot of meat because her husband is a vegetarian.  He is also diabetic, so she tries to maintain a healthy diet for his benefit as well.  They do eat fish once a week but it is fried flounder.  She snacks occassionally and will have a pack of crackers with cheese.   She has started incorporating more apples and oranges in as snacks.  She denies tobacco or alcohol use.          Review of exercise habits reveals that the patient is walking 3 [place] a [place] the mall.  She tries to do 3 loops every time she goes.  She has also started [employer] program but is not happy with it.  She states they will only let them do exercises with 1 lb weights while sitting in a chair.  She is looking for alternative programs to do with a few of her friends.   Lipid Management Provider  Weston Brass, PharmD   Current Medications (verified): 1)  Bayer Aspirin 325 Mg  Tabs (Aspirin) .... Take 1 Tablet By Mouth Once A Day 2)  Lasix 20 Mg  Tabs (Furosemide) .... Take 1 Tablet By Mouth Once A Day 3)  Klor-Con M20 20 Meq Cr-Tabs (Potassium Chloride Crys Cr) .... Take One Tablet By Mouth Two Times A Day 4)  Fish Oil 1000 Mg Caps (Omega-3 Fatty Acids) .... Take 1 Cap By Mouth Once Daily.Marland KitchenMarland Kitchen 5)  Caltrate 600+d Plus  600-400 Mg-Unit Tabs (Calcium Carbonate-Vit D-Min) .... Take 1 Tab By Mouth Once Daily.Marland KitchenMarland Kitchen 6)  Womens Multivitamin Plus  Tabs (Multiple Vitamins-Minerals) .... Take 1 Tab Daily.Marland KitchenMarland Kitchen 7)  Alprazolam 0.5 Mg  Tabs (Alprazolam) .... Take 1/2 To 1 Tablet By Mouth Three Times A Day As Needed For Nerves... Not To Exceed 3 Tabs Per Day. 8)  Meclizine Hcl 25 Mg Tabs (Meclizine Hcl) .... Take 1/2 Tablet Up To 4 Times A Day As Needed For Dizziness 9)  Crestor 10 Mg Tabs (Rosuvastatin Calcium) .... Take One Tablet By Mouth Daily.  Allergies: 1)  ! Sulfa 2)  ! Codeine 3)  Cardizem Cd (Diltiazem Hcl Coated Beads)   Vital Signs:  Patient profile:   70 year old female Pulse rate:   72 / minute BP sitting:   125 / 85  Impression & Recommendations:  Problem # 1:  HYPERLIPIDEMIA (ICD-272.4) Assessment Deteriorated Pt's cholesterol increased significantly without statin.  TC- 270 (goal<200), TG- 145 (goal<150), HDL-39 (goal>45), and LDL 045 (goal<100).  LFTs did return to normal since d/c simvastatin.  Discussed medication options with pt.  Will try Crestor 10mg  daily.  Pt was instructed to call with any signs of muscle pains or  aches.  Encouraged her to find a new source of exercise that will allow her to exert herself a little more.  Will recheck labs in 1 month.   Her updated medication list for this problem includes:    Crestor 10 Mg Tabs (Rosuvastatin calcium) .Marland Kitchen... Take one tablet by mouth daily.  Patient Instructions: 1)  Start new medication: Crestor 10mg  once daily 2)  Continue all other medications 3)  Continue to eat low-fat diet 4)  Try to work out with heavier weights as much as possible.  5)  Lab Appt 07/19/10 at [facility] office (fasting) 6)  Lipid Clinic Appt: 07/22/10 at 10am

## 2010-07-22 NOTE — Progress Notes (Signed)
Summary: prescription  Phone Note Call from Patient   Caller: Patient Call For: dr. Kriste Basque Summary of Call: Patient saw Dr. Kriste Basque in December and her prescriptions were faxed to [facility] but [facility] will not refill her xanax and she would like the xanax sent to [facility]. She went to pick it up and they stated that we sent the note back stating that the patient just had a refill for 220 but she never received them because [facility] will not send xanax through the mail. Patient would like these called in today if possible. Patient can be reached [phone] Initial call taken by: Vedia Coffer,  June 29, 2010 11:49 AM  Follow-up for Phone Call        Spoke with Velna Hatchet at [facility] and she verified that rx for Xanax was not sent to pt because her Medicare plan does not cover this drug.  Spoke with pt and she is aware that she weill have to pay for Xanax at pharmacy.  Refill was sent to [facility]. Abigail Miyamoto RN  June 29, 2010 12:18 PM     Prescriptions: ALPRAZOLAM 0.5 MG  TABS (ALPRAZOLAM) take 1/2 to 1 tablet by mouth three times a day as needed for nerves... not to exceed 3 tabs per day.  #100 x 5   Entered by:   Abigail Miyamoto RN   Authorized by:   Michele Mcalpine MD   Signed by:   Abigail Miyamoto RN on 06/29/2010   Method used:   Telephoned to ...       [facility]* (retail)       [address] [place], [place]  [address]       Ph: [phone]       Fax: [phone]   RxID:   [id number]

## 2010-07-22 NOTE — Assessment & Plan Note (Signed)
Summary: 6 months/apc   Primary Care Provider:  Kriste Basque  CC:  6 month ROV & review of mult medical problems....  History of Present Illness: 70 y/o [name] here for a follow up visit... she has mult med problems as noted below... Followed for general medical purposes w/ hx HBP, CAD & hx Syncope followed by DrTaylor, Hyperlipidemia, Obesity, Fatty Liver disease, LBP/ FM/ Osteopenia, & anxiety...   ~  June 17, 2009:  c/o scratchy throat & wants ZPak,  nose bleeds intermittently & we discussed nasal saline lavage, some left heel pain- needs heel cup, warm soaks, etc... tripped in Nov w/ scrape & mild cellulitis Rx'd w/ Keflex, ven dopplers were neg... she stopped her Simva80 in Sep due to ?abd pain & due for f/u FLP today- may need Lipid Clinic referral for Rx.   ~  December 14, 2009:  she reports a good 6 months- no new complaints or concerns... BP controlled on Lasix & she denies CP, palpit, SOB, edema, etc... she notes dizziness if she tilts her head back "it's cardio-depressor dizziness" she says- no syncope... she decided to re-start her Simva80 & never went to the Lipid Clinic> FLP improved on this but LFT's elevated (FLD) & she will need assessment in the clinic + f/u w/ GI.Marland Kitchen.   ~  June 15, 2010:  18mo ROV- she seems conflicted> told the nurse she has no energy & tired all the time; tells me she feels good "best in 21yrs"... she stopped her Simva80 on her own & went to [facility] 7/11 but didn't return 8/11 as requested> FLP today shows LDL=212 & she is encouraged to ret to the clinic for help... BP stable & she denies CP, palpit, edema, etc;  requesting diff nasal spray & we will try Omnaris.    Current Problems:   ALLERGIC RHINITIS - she states that saline nasal spray dries her eyes out & wants diff inhaler> rec OMNARIS 2sp Qhs...  HYPERTENSION (ICD-401.9) - off Norvasc & Diltiazem, on LASIX 20mg /d, K38mEqBid...BP=120/72 today and she denies HA, fatigue, visual changes, CP, dyspnea, edema, etc...  she notes occas palpitations...   CORONARY ARTERY DISEASE (ICD-414.00) - hx non-obstructive CAD followed by DrTaylor Antony Haste... she denies CP, palpit, ch in SOB, edema, etc...  ~  cath 1996 showed 20-30% lesions in the Circ & RCA, EF=60%...  ~  NuclearStressTest 10/07 showed no scar or ischemia, ?EF=37%?...  ~  2DEcho 10/07 showed no regional wall motion changes, EF=60-65%... valves OK, some DD noted.  ~  repeat cath 12/09 showed similar findings w/ nonobstructive plaque, EF= 65%...  Hx of SYNCOPE (ICD-780.2) - hosp in 2007 for syncope and no etiology discovered... implanted loop recorder placed for 18 months w/o events, then 2 spells after the recorder was removed!!!... rehosp 4/08 w/ neg studies at that time too... repeat eval 11/09 & nothing found...   ~  CDopplers 10/07 showed mild plaque, 0-39% bilat ICA...  ~  neg TiltTable study 4/08...  ~  6/10: notes occas palpit, dizzy- uses Meclizine Prn... saw DrTaylor 7/10- rec diet + exerc.  HYPERLIPIDEMIA (ICD-272.4) - on SIMVASTATIN 80mg /d- she takes it on & off> asked to stop Simva80 & f/u w/ Lipid Clinic... we discussed diet + exercise as well...  ~  FLP 3/09 showed TChol 139, TG 74, HDL 41, LDL 83... continue med + diet therapy...   ~  FLP 11/09 showed TChol 99, TG 74, HDL 30, LDL 54  ~  FLP 6/10 on Simva80 showed TChol 141, TG  90, HDL 34, LDL 89... note: LFT's sl elevated...  ~  FLP 12/10 off Simva showed TChol 279, TG 178, HDL 33, LDL 208... rec> refer to Lipid Clinic (she never went).  ~  FLP 6/11 back on Simva80 showed TChol 154, TG 107, HDL 37, LDL 96... needs LC eval to ch med.  ~  FLP 12/11 off meds showed TChol 270, TG 145, HDL 39, LDL 213... rec to return to [facility] for on-going care.  OBESITY (ICD-278.00) - We discussed diet + exercise program... she states that DrOz says you can gain weight while hardly eating any food & she thinks she isn't eating enough food...   ~  weight 12/09 = 212#  ~  weight 6/10 = 219#  ~  weight 12/10 = 217#  ~   weight 6/11 = 215#  ~  weight 12/11 = 216#.Marland Kitchen we discussed diet + exercise.  DIVERTICULOSIS OF COLON (ICD-562.10) - she needs f/u eval DrDBrodie- ?when was last colonoscopy?  ~  CT Abd 9/10  done for Abd pain- showed divertics & hep steatosis, retained stool & DJD sp...  FATTY LIVER DISEASE (ICD-571.8) - hx elevated LFT's x yrs and eval DrDBrodie in past w/ rec to diet/ exerc/ weight reduction...  ~  labs 6/10 showed SGOT= 89, SGPT= 65  ~  labs 12/10 showed SGOT= 65, SGPT= 54  ~  labs 6/11 showed SGOT= 119, SGPT= 76... needs re-eval by GI  ~  labs 12/11 showed SGOT= 53, SGPT= 44  LOW BACK PAIN, CHRONIC (ICD-724.2) - s/p lumbar surg in 1989 by DrBotero...  FIBROMYALGIA (ICD-729.1)  OSTEOPENIA (ICD-733.90) - ?prev BMD from GYN... on Calcium, MVI, VitD...  ~  BMD here 3/09 showed TScores -0.7 in Spine, and -1.6 in left FemNeck  ~  6/11:  f/u BMD due & we will sched.  DIZZINESS (ICD-780.4) - she takes MECLIZINE 25mg  Bid...  ~  s/p ENT eval by Gayla Medicus 2/08 felt to be benign positional vertigo, Rx w/ meclizine...   ~  she cancelled a planned neuro evaluation 4/08...  ANXIETY (ICD-300.00) - on XANAX 0.5mg Tid (takes 1AM & 2HS)...  SKIN CANCER - s/o Moh's surg for BCCa left cheek...  HEALTH MAINT--- GYN= prev DrFore and needs new Gyn now... [facility]... had neg FlexSig 1991 DrBrodie and needs routine colonoscopy scheduled... states she uses olive oil on her dry skin per DrOz... had Pneumovax 2008, & 2010 Flu shot in Oct.   Preventive Screening-Counseling & Management  Alcohol-Tobacco     Smoking Status: never  Allergies: 1)  ! Sulfa 2)  ! Codeine 3)  ! Cardizem Cd (Diltiazem Hcl Coated Beads)  Comments:  Nurse/Medical Assistant: The patient's medications and allergies were reviewed with the patient and were updated in the Medication and Allergy Lists.  Past History:  Past Medical History: HYPERTENSION (ICD-401.9) CORONARY ARTERY DISEASE (ICD-414.00) Hx of  SYNCOPE (ICD-780.2) HYPERLIPIDEMIA (ICD-272.4) OBESITY (ICD-278.00) DIVERTICULOSIS OF COLON (ICD-562.10) FATTY LIVER DISEASE (ICD-571.8) LOW BACK PAIN, CHRONIC (ICD-724.2) FIBROMYALGIA (ICD-729.1) OSTEOPENIA (ICD-733.90) DIZZINESS (ICD-780.4) ANXIETY (ICD-300.00)  Past Surgical History: S/P T & A S/P hysterectomy & BSO S/P cholecystectomy - 4/91 by Hollace Kinnier S/P back surgery - L5 S1 diskectomy & foraminotomy 2/89 by DrBotero S/P hand surgery for Dupytren's contracture S/P Moh's surg for basal cell on left cheek 2/10 DrLeschin Loop recorder implanted Loop recorder explanted (nov 2009)  Family History: Reviewed history from 02/26/2010 and no changes required. mother deceased age 10 from stomach cancer father deceased from pneumonia--pt was only 3 when  father passed--unsure of his age at the time Brother passed age 29 -Merkel cell carcinoma 1 sister alive hx of breast cancer, stomach cancer, uterine cancer 1 sister alive hx of melanoma Family History of Diabetes: mother No FH of Colon Cancer:  Social History: Reviewed history from 12/17/2008 and no changes required. never smoked never drinks exposed to second hand smoke exercise---walks 1 times per week caffenine use--1 cup per day married x 53 years 4 children  Review of Systems      See HPI       The patient complains of dyspnea on exertion.  The patient denies anorexia, fever, weight loss, weight gain, vision loss, decreased hearing, hoarseness, chest pain, syncope, peripheral edema, prolonged cough, headaches, hemoptysis, abdominal pain, melena, hematochezia, severe indigestion/heartburn, hematuria, incontinence, muscle weakness, suspicious skin lesions, transient blindness, difficulty walking, depression, unusual weight change, abnormal bleeding, enlarged lymph nodes, and angioedema.    Vital Signs:  Patient profile:   70 year old female Height:      63 inches Weight:      215.38 pounds BMI:     38.29 O2 Sat:       96 % on Room air Temp:     97.1 degrees F oral Pulse rate:   81 / minute BP sitting:   120 / 72  (left arm) Cuff size:   large  Vitals Entered By: Randell Loop CMA (June 15, 2010 10:07 AM)  O2 Sat at Rest %:  96 O2 Flow:  Room air CC: 6 month ROV & review of mult medical problems... Is Patient Diabetic? No Pain Assessment Patient in pain? no      Comments meds updated today with pt   Physical Exam  Additional Exam:   WD, Obese, 70 y/o [name] in NAD... GENERAL:  Alert & oriented; pleasant & cooperative... HEENT:  [place]/AT, EOM-wnl,  PERRLA, EACs- wax, TMs-wnl, NOSE-pale, THROAT-clear & wnl. NECK:  Supple w/ fairROM; no JVD; normal carotid impulses w/o bruits; no thyromegaly or nodules palpated; no lymphadenopathy. CHEST:  Clear BS bilaterally without wheezing, rales, or signs of consolidation... HEART:  Regular Rhythm; without murmurs/ rubs/ or gallops heard... ABDOMEN:  Soft & nontender; normal bowel sounds; no organomegaly or masses detected. EXT: without deformities, mild arthritic changes; no varicose veins/ +venous insuffic/ tr edema... NEURO:  CN's intact, no focal neuro deficits... DERM:  scar on left cheek, no lesions noted...    MISC. Report  Procedure date:  06/15/2010  Findings:      BMP (METABOL)   Sodium                    142 mEq/L                   135-145   Potassium                 4.3 mEq/L                   3.5-5.1   Chloride                  105 mEq/L                   96-112   Carbon Dioxide            30 mEq/L                    19-32   Glucose              [  H]  103 mg/dL                   19-14   BUN                       14 mg/dL                    7-82   Creatinine                0.8 mg/dL                   9.5-6.2   Calcium                   9.0 mg/dL                   1.3-08.6   GFR                       78.92 mL/min                >60.00  Hepatic/Liver Function Panel (HEPATIC)   Total Bilirubin           0.8 mg/dL                   5.7-8.4    Direct Bilirubin          0.2 mg/dL                   6.9-6.2   Alkaline Phosphatase      115 U/L                     39-117   AST                  [H]  53 U/L                      0-37   ALT                  [H]  44 U/L                      0-35   Total Protein             6.7 g/dL                    9.5-2.8   Albumin                   3.8 g/dL                    4.1-3.2  Lipid Panel (LIPID)   Cholesterol          [H]  270 mg/dL                   4-401   Triglycerides             145.0 mg/dL                 0.2-725.3   HDL                  [L]  66.44 mg/dL                 >03.47  Cholesterol LDL - Direct  212.5 mg/dL    Impression & Recommendations:  Problem # 1:  HYPERTENSION (ICD-401.9) BP controlled>  same meds. Her updated medication list for this problem includes:    Lasix 20 Mg Tabs (Furosemide) .Marland Kitchen... Take 1 tablet by mouth once a day  Orders: TLB-BMP (Basic Metabolic Panel-BMET) (80048-METABOL) TLB-Hepatic/Liver Function Pnl (80076-HEPATIC) TLB-Lipid Panel (80061-LIPID)  Problem # 2:  CORONARY ARTERY DISEASE (ICD-414.00) Followed for Le[facility] Cards by DrTaylor>  stable, no angina. Her updated medication list for this problem includes:    Bayer Aspirin 325 Mg Tabs (Aspirin) .Marland Kitchen... Take 1 tablet by mouth once a day    Lasix 20 Mg Tabs (Furosemide) .Marland Kitchen... Take 1 tablet by mouth once a day  Problem # 3:  HYPERLIPIDEMIA (ICD-272.4) She stopped the Simva80 & LDL incr to 12... rec f/u lipid clinic!!! The following medications were removed from the medication list:    Zocor 80 Mg Tabs (Simvastatin) .Marland Kitchen... Take 1/2  tablet by mouth at bedtime...  Problem # 4:  OBESITY (ICD-278.00) We discussed diet + exercise...  Problem # 5:  FATTY LIVER DISEASE (ICD-571.8) Weight reduction is the key... careful w/ statins & f/u in LC.  Problem # 6:  FIBROMYALGIA (ICD-729.1) She has FM physiology... Her updated medication list for this problem includes:    Bayer  Aspirin 325 Mg Tabs (Aspirin) .Marland Kitchen... Take 1 tablet by mouth once a day  Problem # 7:  OTHER MEDICAL PROBLEMS AS NOTED>>>  Complete Medication List: 1)  Bayer Aspirin 325 Mg Tabs (Aspirin) .... Take 1 tablet by mouth once a day 2)  Lasix 20 Mg Tabs (Furosemide) .... Take 1 tablet by mouth once a day 3)  Klor-con M20 20 Meq Cr-tabs (Potassium chloride crys cr) .... Take one tablet by mouth two times a day 4)  Fish Oil 1000 Mg Caps (Omega-3 fatty acids) .... Take 1 cap by mouth once daily.Marland KitchenMarland Kitchen 5)  Caltrate 600+d Plus 600-400 Mg-unit Tabs (Calcium carbonate-vit d-min) .... Take 1 tab by mouth once daily.Marland KitchenMarland Kitchen 6)  Womens Multivitamin Plus Tabs (Multiple vitamins-minerals) .... Take 1 tab daily.Marland KitchenMarland Kitchen 7)  Alprazolam 0.5 Mg Tabs (Alprazolam) .... Take 1/2 to 1 tablet by mouth three times a day as needed for nerves... not to exceed 3 tabs per day.  Patient Instructions: 1)  Today we updated your med list- see below.... 2)  We refilled your meds per request... 3)  Today we did your folow up blood chenistries & Lipids...  please call the "phone tree" in a few days for your lab results.Marland KitchenMarland Kitchen 4)  Let's get on track w/ out low chol/ low fat diet & exercise program... the goalis to lose 10-15 lbs... 5)  Call for any questions.Marland KitchenMarland Kitchen 6)  Please schedule a follow-up appointment in 6 months. Prescriptions: ALPRAZOLAM 0.5 MG  TABS (ALPRAZOLAM) take 1/2 to 1 tablet by mouth three times a day as needed for nerves... not to exceed 3 tabs per day.  #270 x 1   Entered and Authorized by:   Michele Mcalpine MD   Signed by:   Michele Mcalpine MD on 06/15/2010   Method used:   Print then Give to Patient   RxID:   [id number] KLOR-CON M20 20 MEQ CR-TABS (POTASSIUM CHLORIDE CRYS CR) take one tablet by mouth two times a day  #180 x 4   Entered and Authorized by:   Michele Mcalpine MD   Signed by:   Michele Mcalpine MD on 06/15/2010   Method used:   Print then Give  to Patient   RxID:   [id number] LASIX 20 MG  TABS (FUROSEMIDE) Take 1  tablet by mouth once a day  #90 x 4   Entered and Authorized by:   Michele Mcalpine MD   Signed by:   Michele Mcalpine MD on 06/15/2010   Method used:   Print then Give to Patient   RxID:   [id number]    Immunization History:  Influenza Immunization History:    Influenza:  historical (04/12/2010)

## 2010-08-11 NOTE — Assessment & Plan Note (Signed)
Summary: [facility]   Primary Provider:  Kriste Basque   History of Present Illness:   [name] presents today for dyslipidemia follow-up.  At last visit, we restarted her on Crestor 10mg  daily and continued fish oil 1 gm daily.  She does report feeling more sluggish and tired since restarting statin but not to the extent she had with simvastatin.         Her diet is relatively unchanged since last visit.   She has tried to decrease fried fish and snacks.  For breakfast she typically has cheerios or frosted flakes with 2% milk or oatmeal.  Lunch is sometimes leftovers from the night before or egg sandwiches.  For dinner, she typically eats vegetables, including green beans, carrots, cabbage, sweet potatoes, and peas.  She does not eat a lot of meat because her husband is a vegetarian.  He is also diabetic, so she tries to maintain a healthy diet for his benefit as well.  She has started incorporating more apples and oranges in as snacks.  She denies tobacco or alcohol use.  She drinks water most of the time with a little coffee.         Review of exercise habits reveals that the patient is walking 3 times a week at [employer] or the mall.  She and a friend will walk around [place] approximately 5 times.  She has also started [employer] program but is not participating anymore.   Current Medications (verified): 1)  Bayer Aspirin 325 Mg  Tabs (Aspirin) .... Take 1 Tablet By Mouth Once A Day 2)  Lasix 20 Mg  Tabs (Furosemide) .... Take 1 Tablet By Mouth Once A Day 3)  Klor-Con M20 20 Meq Cr-Tabs (Potassium Chloride Crys Cr) .... Take One Tablet By Mouth Two Times A Day 4)  Fish Oil 1000 Mg Caps (Omega-3 Fatty Acids) .... Take 2 Caps By Mouth Once Daily. 5)  Womens Multivitamin Plus  Tabs (Multiple Vitamins-Minerals) .... Take 1 Tab Daily.Marland KitchenMarland Kitchen 6)  Alprazolam 0.5 Mg  Tabs (Alprazolam) .... Take 1/2 To 1 Tablet By Mouth Three Times A Day As Needed For Nerves... Not To Exceed 3 Tabs Per Day. 7)  Meclizine  Hcl 25 Mg Tabs (Meclizine Hcl) .... Take 1/2 Tablet Up To 4 Times A Day As Needed For Dizziness 8)  Crestor 10 Mg Tabs (Rosuvastatin Calcium) .... Take One Tablet By Mouth Every Other Day.  Allergies: 1)  ! Sulfa 2)  ! Codeine 3)  Cardizem Cd (Diltiazem Hcl Coated Beads)  Vital Signs:  Patient profile:   70 year old female Height:      63 inches Weight:      215 pounds   Impression & Recommendations:  Problem # 1:  HYPERLIPIDEMIA (ICD-272.4) Pt's cholesterol improved since restarting statin.  TC- 210 (goal<200), TG- 190 (goal<150), HDL- 37.1 (goal>45), LDL- 454.0 (goal<70).  LFTs are WNL.  Unfortunately pt unable to tolerate statin every day and wants to make a change.  Wil try to decrease Crestor to every other day and increase fish oil, realizing that pt will most likely not reach goal with this combination and may need additional therapy in future.  Will ask pt to continue making healthy lifestyle choices and increase walking.  Will f/u in 1-2 months.   Her updated medication list for this problem includes:    Crestor 10 Mg Tabs (Rosuvastatin calcium) .Marland Kitchen... Take one tablet by mouth every other day.  Patient Instructions: 1)  Change Crestor to 10mg  every  other day 2)  Increase fish oil to 2 gm daily

## 2010-08-16 ENCOUNTER — Other Ambulatory Visit: Payer: Medicare Other

## 2010-08-19 ENCOUNTER — Ambulatory Visit: Payer: Medicare Other

## 2010-10-12 ENCOUNTER — Telehealth: Payer: Self-pay | Admitting: Pulmonary Disease

## 2010-10-12 MED ORDER — PREDNISONE (PAK) 5 MG PO TABS: ORAL_TABLET | ORAL | Status: DC

## 2010-10-12 NOTE — Telephone Encounter (Signed)
Spoke w/ pt and she states she has poison ivy on her right eye lid, right hand, on her chest, on her forehead x last night. Pt states she has been putting benadryl cream on it. Pt states the poison ivy is getting worse. Pt not sure how this has came about. Pt states last time this happened Dr. Kriste Basque gave her prednisone and some type of cream and that helped. Please advise Dr. Kriste Basque recs for pt. Thanks  Allergies  Allergen Reactions  . Codeine     REACTION: nausea  . Diltiazem Hcl     REACTION: pt states it made her dizzy...  . Sulfonamide Derivatives     REACTION: red/splotchy hives    Carver Fila, CMA

## 2010-10-12 NOTE — Telephone Encounter (Signed)
Advised pt of sn recs. Pt advised rx was sent to pharmacy. Pt aware to call back if poison ivy gets worse. Nothing further was needed

## 2010-10-12 NOTE — Telephone Encounter (Signed)
Per SN---please call in pred dosepak  5 mg   6 day pack with no refills, take as directed. thanks

## 2010-11-02 NOTE — Assessment & Plan Note (Signed)
[facility]                                 ON-CALL NOTE   FRAYDA, EGLEY                       MRN:          [id number]  DATE:08/29/2007                            DOB:          07/07/1940    PRIMARY CARDIOLOGIST:  Doylene Canning. Ladona Ridgel, M.D.   I received a call from Jannet Mantis this evening at 6:41 p.m..  I called  Ms. Estorga back.  She told me that she has been having back pain between  her shoulder blades since about noon today.  She had similar back pain 3  days ago that lasted a few hours then went away.  She denies any  associated symptoms like shortness of breath, nausea, vomiting, or  diaphoresis.  Ms. Auvil is asking what she should do.  I have recommend  that she come into the emergency room for evaluation by the ER  physicians.  Of note, she had a cardiac catheterization roughly 18  months ago that showed nonobstructive coronary artery disease.  Ms.  Caputi daughter, Fuller Song, then got on the phone and asked what  they should do.  I reaffirmed that as it is now after hours and she is  been having back pain for 7 hours, that they should come into the ED for  evaluation and pain management.  Ms. Linna Darner asked if we would be coming  down to see the patient in the emergency room, and I advised that it is  not clear to me based on the history that this is cardiac in origin, and  that the ER doctors would evaluated the patient and call us if need be.  They are coming to the emergency room tonight.      Nicolasa Ducking, ANP  Electronically Signed      Doylene Canning. Ladona Ridgel, MD  Electronically Signed   CB/MedQ  DD: 08/29/2007  DT: 08/31/2007  Job #: [id number]

## 2010-11-02 NOTE — Assessment & Plan Note (Signed)
[facility]                         ELECTROPHYSIOLOGY OFFICE NOTE   [name], [name]                       MRN:          [id number]  DATE:04/28/2008                            DOB:          July 31, 1940    [name] returns today for follow up.  She is a pleasant middle-aged  woman with unexplained syncope and hypertension who is status post  insertion of an implantable loop recorder.  She has had no arrhythmias  and no recurrent syncope since she has had her loop recorder.  She did  note that last week she developed a flu-like illness (questionable swine  flu) which is now resolved.  She has no more fevers or chills.   MEDICATIONS:  Her medicines today include:  1. Potassium 20 a day.  2. Amlodipine 5 a day.  3. Lasix 20 a day.  4. Zocor 80 a day.  5. Alprazolam t.i.d. p.r.n.   PHYSICAL EXAMINATION:  GENERAL:  She is a pleasant, well-appearing  middle-aged woman in no distress.  VITAL SIGNS:  Blood pressure 136/90, pulse 76 and regular, respirations  were 18.  The weight was 209 pounds.  NECK:  No jugular venous distention.  LUNGS:  Clear bilaterally to auscultation.  No wheezes, rales or rhonchi  were present.  CARDIOVASCULAR:  Regular rate and rhythm.  Normal S1-S2.  ABDOMEN:  Soft, nontender, nondistended.  There was no organomegaly.  EXTREMITIES:  No edema.   Interrogation of her loop recorder demonstrates no intercurrent  arrhythmias.   IMPRESSION:  1. Syncope.  2. Status post implantable loop recorder.  3. Hypertension.   DISCUSSION:  Ms. Mosher' loop recorder is at [facility].  Will plan on removing  it.  I have discussed this with her.  She wishes to proceed.     Doylene Canning. Ladona Ridgel, MD  Electronically Signed    GWT/MedQ  DD: 04/28/2008  DT: 04/28/2008  Job #: [id number]

## 2010-11-02 NOTE — Assessment & Plan Note (Signed)
[facility]                         ELECTROPHYSIOLOGY OFFICE NOTE   TYLER, ROBIDOUX                       MRN:          [id number]  DATE:12/27/2007                            DOB:          04-23-1941    Ms. Goffredo returns today for followup.  She is a very pleasant middle-aged  woman with a history of unexplained syncope and also with recurrent  dizzy spells who also has hypertension, dyslipidemia, and nonobstructive  coronary disease who returns today for followup.  She is status post  insertion of an implantable loop recorder back in April 2008.  Since  then she has had no recurrent syncope.  She notes that she does have  some weekdays and days where she feels some dizzy and lightheaded, but  has never had any frank syncope.  She denies chest pain.  She denies  shortness of breath.   MEDICATIONS:  1. Potassium 20 twice a day.  2. Amlodipine 5 a day.  3. Lasix 20 a day.  4. Zocor 80 a day.  5. Alprazolam t.i.d.   PHYSICAL EXAMINATION:  GENERAL:  She is a pleasant well-appearing middle-  aged woman in no distress.  VITAL SIGNS:  Blood pressure 138/100, the pulse 80 and regular, and  respirations were 18.  The weight was 211 pounds.  NECK:  No jugular venous distention.  LUNGS:  Clear bilateral auscultation.  No wheezes, rales, or rhonchi are  present.  CARDIOVASCULAR:  Reveals regular rate and rhythm.  Normal S1 and S2.  There are no murmurs, rubs or gallops.  ABDOMEN:  Soft and nontender.  EXTREMITIES:  No edema.   EKG demonstrates sinus rhythm with poor R-wave progression.  There is  left axis.   IMPRESSION:  1. Unexplained syncope.  2. Status post insertion of an implantable loop recorder with no      documented brady or tachy arrhythmia events.  3. Hypertension.   DISCUSSION:  Overall, Ms. Steven is stable.  Her loop recorder remains  working, but there has been no etiology for her syncope noted.  She has  had no brady or tachy  episodes documented.  We will plan on having her come back in 3-4 months for removal of her  loop recorder and she is instructed to call us, if she has recurrent  syncope.     Doylene Canning. Ladona Ridgel, MD  Electronically Signed    GWT/MedQ  DD: 12/27/2007  DT: 12/28/2007  Job #: [id number]

## 2010-11-02 NOTE — Discharge Summary (Signed)
NAMEVERONIQUE, [name]                ACCOUNT NO.:  [id number]   MEDICAL RECORD NO.:  [id number]          PATIENT TYPE:  OIB   LOCATION:  2899                         FACILITY:  [facility]   PHYSICIAN:  Doylene Canning. Ladona Ridgel, MD    DATE OF BIRTH:  Sep 13, 1940   DATE OF ADMISSION:  04/29/2008  DATE OF DISCHARGE:  04/29/2008                               DISCHARGE SUMMARY   FINAL DIAGNOSES:  1. Unexplained syncope.  2. Loop recorder placed with no documented brady or tachy arrhythmia      events.  3. Explantation of loop recorder on April 29, 2008, as it is at end-      of-life.   SECONDARY DIAGNOSES:  1. Hypertension.  2. Some dizziness and lightheadedness but no syncope.  3. Dyslipidemia.  4. Nonobstructive coronary artery disease.  5. Implantable loop recorder implanted in April 2008.   PROCEDURE:  On April 29, 2008, explantation of a loop recorder, Dr.  Lewayne Bunting.   BRIEF HISTORY:  [name] is a 70 year old female.  She has a history of  unexplained syncope.  She had a loop recorder implanted in April 2008.  She has had no recurrent syncope.  She does have some lightheadedness,  some dizziness.  The loop recorder has never recorded any tachy or brady  episodes.  The patient is having her loop recorder explanted secondary  to end-of-life.   Laboratory studies pertinent to this admission drawn on April 28, 2008, white cells 4.8, hemoglobin 14, hematocrit 41, platelets of 216.  Protime 12.2, INR is 1.  Sodium of 143, potassium 3.8, chloride 109,  carbonate 28, glucose 109, BUN is 13, creatinine is 0.7.      Maple Mirza, PA      Doylene Canning. Ladona Ridgel, MD  Electronically Signed    GM/MEDQ  D:  04/29/2008  T:  04/30/2008  Job:  [id number]

## 2010-11-02 NOTE — Assessment & Plan Note (Signed)
[facility]                            CARDIOLOGY OFFICE NOTE   [name], [name]                       MRN:          [id number]  DATE:05/21/2008                            DOB:          02/25/1941    [name] is seen in the office today following a loop recorder  explantation a couple of weeks ago.  Two days after going home she had a  syncopal episode.  This was quite stereotypical.  She was walking down  the hall.  She had very little warning.  She fell to the floor and hurt  her knee.  The recovery phase has a stereotypical presentation,  including significant residual fatigue, being very hot and clammy, being  described as pale.  She was able to get up off the ground in a couple of  minutes without significant residual.  She had had multiple episodes  prior to the loop recorder being implanted.  It was in for 18, had no  recurrences and then had the post-procedural event, as noted.   The other concern is that she developed chest pain last night.  This has  been unresponsive to Rolaids and antacids.  It has radiated into her  neck and into her shoulders and into her back.  It is accompanied by  some shortness of breath.  It typically has been coming in waxing and  waning fashion and lasting about 5 minutes and then abating.  She had a  Cardiolite evaluation in 2007 that was negative.   Her post-procedural time course has also been notable for significant  fatigue and lassitude.  She has not been sleeping well.   Before her explantation, she had a flu-like illness that was thought to  be possibly swine flu.  She, interestingly, got the swine flu vaccine,  notwithstanding.   CURRENT MEDICATIONS INCLUDE:  1. Lasix 20.  2. Amlodipine 5.  3. Potassium 20 b.i.d.  4. Zocor and alprazolam for sleep.   ALLERGIES:  She is allergic to SULFA and CODEINE.   SOCIAL HISTORY:  She is married.   PAST MEDICAL HISTORY:  In addition to the above is  notable for  fibromyalgia and hypertension, obesity, anxiety.   PAST SURGICAL HISTORY:  Notable for cholecystectomy, hysterectomy, back  surgery and right carpal tunnel surgery.   PHYSICAL EXAM:  On examination today, she was an older Caucasian female,  appearing her stated age of 66.  Her weight is 200 pounds and she is about 5 feet 2.  Her pulse was 93.  HEENT:  Demonstrated no xanthomata.  NECK:  Her neck veins were flat.  The carotids were brisk.  BACK:  Without kyphosis or scoliosis.  LUNGS:  Clear.  HEART:  Heart sounds were regular.  The loop explant site was well-  healed without erythema.  ABDOMEN:  Soft.  EXTREMITIES:  No edema.  NEUROLOGICAL:  Grossly normal.   Orthostatics obtained today demonstrated a blood pressure change of 126-  142 with standing, accompanied by a heart rate of 79-93, associated with  some dizziness.   IMPRESSION:  1. Recurrent syncope,  almost certainly neurally mediated by history,      according to the context of what was previously known to be a      normal heart by perfusion and echo imaging.  2. Status post loop implantation and subsequent explantation with      recurrence immediately thereafter.  3. Chest pain with typical and atypical features in a lady whose      cardiac risk factors include:      a.     Hypertension.      b.     Age.      c.     Her lipid status known for HDL of 113 in October of 2007.  4. Anxiety disorder.   With Mrs. Pla' chest pain syndrome, notwithstanding her  electrocardiogram being unchanged, I think that inpatient monitoring to  exclude myocardial infarction is appropriate with likely need for repeat  Myoview scan.   In addition, I think it may be worth trying to find alternative  antihypertensive therapy besides the diuretic, which may aggravate her  tendency towards neurally mediated syncope.     Duke Salvia, MD, [facility]  Electronically Signed    SCK/MedQ  DD: 05/21/2008  DT: 05/21/2008  Job #:  [id number]

## 2010-11-02 NOTE — Cardiovascular Report (Signed)
[name], [name]                ACCOUNT NO.:  [id number]   MEDICAL RECORD NO.:  [id number]          PATIENT TYPE:  INP   LOCATION:  3705                         FACILITY:  [facility]   PHYSICIAN:  Verne Carrow, MDDATE OF BIRTH:  [birth year]   DATE OF PROCEDURE:  05/22/2008  DATE OF DISCHARGE:  05/22/2008                            CARDIAC CATHETERIZATION   PRIMARY CARDIOLOGIST:  Doylene Canning. Ladona Ridgel, MD   PROCEDURE PERFORMED:  1. Left heart catheterization.  2. Selective coronary angiography.  3. Left ventricular angiogram.   OPERATOR:  Verne Carrow, MD   INDICATIONS:  Chest pain.   PROCEDURE IN DETAIL:  The patient was brought to the Cardiac  Catheterization Laboratory after signing informed consent for the  procedure.  The right groin was prepped and draped in the sterile  fashion.  Lidocaine 1% was used for local anesthesia.  A 5-French sheath  was inserted into the right femoral artery without difficulty.  A JL-4  diagnostic catheter was initially used, however, this catheter was  selective into the circumflex system.  I took several pictures  selectively of the circumflex system and then switched out for a JL-3.5  diagnostic catheter which was selective into the left main, but not into  the circumflex.  I then performed an angiography to include the left  anterior descending coronary artery.  A 5-French JR-4 catheter was then  used to selectively engage and inject the right coronary artery.  A 5-  French pigtail catheter was then inserted across the aortic valve into  the left ventricle.  Following the performance of a left ventricular  angiogram, the pigtail catheter was pulled back across the aortic valve  with no significant pressure gradient measured.  The patient tolerated  the procedure well and was taken to the holding area in stable  condition.   ANGIOGRAPHIC FINDINGS:  1. The left main coronary artery is short and has no evidence of      disease.  2.  The left anterior descending is a moderate-sized vessel that      courses to the apex and becomes rather small as it approaches the      apex.  It gives off several diagonal branches that are small in      caliber.  There is plaque disease noted in the mid and distal LAD.      I cannot totally exclude a small myocardial bridging segment in the      mid LAD.  3. The circumflex artery gives off an early obtuse marginal branch      that is free of disease.  There is a moderate-sized second obtuse      marginal branch that has plaque noted in the ostium.  4. The right coronary artery is a small nondominant artery that has      plaque noted in the midportion of the take off of a moderate-sized      right ventricular marginal branch.  5. The left ventricular angiogram shows normal systolic function with      no wall motion abnormalities.  Ejection fraction of 65%.   HEMODYNAMIC  DATA:  LV pressure 133/6, end-diastolic pressure 11, and  central aortic pressure 118/61.   IMPRESSION:  1. Mild nonobstructive coronary artery disease.  2. Normal left ventricular systolic function.   RECOMMENDATIONS:  Medical management and follow up with Dr. Lewayne Bunting  in the office in 1-2 weeks.  This case was reviewed with Dr. Ladona Ridgel  following the procedure.      Verne Carrow, MD  Electronically Signed     CM/MEDQ  D:  05/22/2008  T:  05/23/2008  Job:  [id number]   cc:   Doylene Canning. Ladona Ridgel, MD

## 2010-11-02 NOTE — Assessment & Plan Note (Signed)
[facility]                         ELECTROPHYSIOLOGY OFFICE NOTE   JAMICA, WOODYARD                       MRN:          [id number]  DATE:06/18/2008                            DOB:          04/20/1941    Ms. Champine returns today for followup.  She is a very pleasant middle-aged  woman with unexplained syncope and hypertension and chest pain.  She has  had catheterization demonstrating no obstructive coronary disease.  She  had syncope and wore a loop recorder for almost 2 years and had no  syncope, but has had two episodes since then, the first occurring  several days after her loop was removed.  The most recent one occurred  when she was having some work done on her garage and as she looked up  toward the ceiling she passed out.  She wonders whether there is  something to do with the way her head was moving had something to do  with her passing out.  She has had no episodes since.  She denies other  symptoms.   CURRENT MEDICATIONS:  1. Potassium 20 mEq twice daily.  2. Lasix 20 a day.  3. Zocor 80 a day.  4. Aspirin 325 a day.   PHYSICAL EXAMINATION:  GENERAL:  She is a pleasant middle-aged woman in  no acute distress.  VITAL SIGNS:  Blood pressure 114/70, the pulse 76 and regular, the  respirations were 18, weight was not recorded.  NECK:  No jugular venous distention.  LUNGS:  Clear bilaterally to auscultation.  No wheezes, rales, or  rhonchi are present.  No increased work of breathing.  CARDIOVASCULAR:  Regular rate and rhythm.  Normal S1 and S2.  ABDOMEN:  Soft, nontender.  EXTREMITIES:  No edema.   EKG demonstrates sinus rhythm with normal axis and intervals.   IMPRESSION:  1. Unexplained syncope.  2. Hypertension.  3. Chest pain without obstructive coronary disease.   DISCUSSION:  Ms. Morton' symptoms complex continued to be difficult to  understand.  It is unclear what the etiology of her syncope truly is.  Today, I tried carotid  sinus massage and this  did not result in bradycardia.  We will plan a period of watchful  waiting with Ms. Criswell and see how she does.     Doylene Canning. Ladona Ridgel, MD  Electronically Signed    GWT/MedQ  DD: 06/18/2008  DT: 06/18/2008  Job #: [id number]

## 2010-11-02 NOTE — Op Note (Signed)
[name], [name]                ACCOUNT NO.:  [id number]   MEDICAL RECORD NO.:  [id number]          PATIENT TYPE:  OIB   LOCATION:  2899                         FACILITY:  [facility]   PHYSICIAN:  Doylene Canning. Ladona Ridgel, MD    DATE OF BIRTH:  1941/05/04   DATE OF PROCEDURE:  04/29/2008  DATE OF DISCHARGE:                               OPERATIVE REPORT   PROCEDURE PERFORMED:  Removal of implantable loop recorder.   INDICATIONS:  Status post insertion of implantable loop recorder  secondary to syncope now with device at end-of-life.   INTRODUCTION:  The patient is a 70 year old woman with a history of  unexplained syncope who is admitted to the hospital for removal of  implantable loop recorder.  The patient's loop recorder has reached end-  of-life.   PROCEDURE:  After informed consent was obtained, the patient was taken  to diagnostic EP lab in a fasting state.  After usual preparation and  draping, she was sedated with fentanyl and Versed.  Lidocaine 30 mL was  infiltrated in the subcutaneous tissue.  A 3-cm incision was carried out  over this region.  Electrocautery utilized to dissect down to the loop  recorder insertion site.  The device was removed without difficulty and  with Kelly forceps.  The silk suture was also removed without difficulty  utilizing electrocautery.  Electrocautery was utilized to assure  hemostasis.  Gentamicin irrigation was utilized to irrigate the  incision.  Incision was closed with layer of 2-0 Vicryl followed by  layer of 3-0 Vicryl.  Benzoin was painted on the skin, Steri-Strips were  applied, and pressures was placed, and the patient was returned to her  room in satisfactory condition.   COMPLICATIONS:  There were no immediate procedure complications.   RESULTS:  Demonstrated successful explantation of a [employer]  implantable loop recorder without immediate procedure complications.      Doylene Canning. Ladona Ridgel, MD  Electronically Signed     GWT/MEDQ   D:  04/29/2008  T:  04/29/2008  Job:  [id number]

## 2010-11-02 NOTE — Discharge Summary (Signed)
[name], [name]                ACCOUNT NO.:  [id number]   MEDICAL RECORD NO.:  [id number]          PATIENT TYPE:  INP   LOCATION:  3705                         FACILITY:  [facility]   PHYSICIAN:  Duke Salvia, MD, FACCDATE OF BIRTH:  06-08-1941   DATE OF ADMISSION:  05/21/2008  DATE OF DISCHARGE:  05/22/2008                               DISCHARGE SUMMARY   ALLERGIES:  This patient has allergies to SULFA and CODEINE.   FINAL DIAGNOSES:  1. Admitted with chest pain which started the evening of May 21, 2008.      a.     Pain radiated to neck, shoulders, and back.      b.     The patient had some dyspnea.      c.     The pain waxes and wanes.  2. Troponin I studies 0.01 and 0.01.  3. Left heart catheterization, May 22, 2008:  Ejection fraction      preserved at 65%, no wall motion abnormalities, mild nonobstructive      coronary artery disease.  All of the plaques that are enumerated in      the drawing for the left heart catheterization have no attendant      percentages, they are that mild.  Certainly, this is a non-flow      limiting amount of coronary artery disease.   SECONDARY DIAGNOSES:  1. Echocardiogram, October 2007:  Ejection fraction 60-65%, no left      ventricular wall motion abnormalities, diastolic dysfunction.  2. History of syncope, unclear etiology.      a.     Loop recorder implanted 18 months ago.      b.     Loop explanted at end-of-life, April 29, 2008.  3. The patient had a recurrence of syncope, May 01, 2008 after 2      days after explantation and after 18 months without any episodes.  4. Hypertension.  5. Fibromyalgia.  6. Obesity.  7. Anxiety.  8. Status post cholecystectomy, hysterectomy, and back surgery.  9. Right carpal tunnel release.   PROCEDURES THIS ADMISSION:  Left heart catheterization.  This study as  dictated above showed mild nonobstructive coronary artery disease with  ejection fraction of 65%.  Dr. Clifton James was  the practitioner.  The  patient was discharged the same day.   BRIEF HISTORY:  [name] is a 70 year old female.  She presented to the  office on May 21, 2008.  She had her loop explanted on April 29, 2008.  This was quiescent for any syncope for an 89-month period and  then it expired.  Two days after that, she had a recurrent syncopal  event.  She is also complaining that she is developing chest pain and  that has been unresponsive to Rolaids and antacid.  It radiates into the  neck, shoulders, and back.  It is accompanied by some shortness of  breath.  It is typically waxing and waning.  It lasted about 5 minutes  and then abates.  A Cardiolite study in 2007 was negative.  With [name]' chest pain syndrome, it is possible that inpatient  monitoring to exclude myocardial infarctions appropriate.  The patient  may need a Myoview scan versus catheterization.  In addition, it may be  worse trying to find an alternative antihypertensive therapy besides  diuretic, which may aggravate her tendency towards an early mediated  syncope.   HOSPITAL COURSE:  The patient was admitted from the office to [facility] for telemetry study and also for cardiac enzymes cycling.  Her  troponin I studies have been negative, 0.01 and 0.01.  She has had no  recurrent chest pain while at [facility] nor has she had  syncope.  She was seen by Dr. Ladona Ridgel on May 22, 2008, and he thought  that it would be possibly best for her to have left heart  catheterization.  This was done on the same day, May 22, 2008.  The  study showed mild nonobstructive coronary artery disease with preserved  ejection fraction and no left ventricular wall motion abnormalities.  The patient discharging the same day.  In keeping with Dr. Odessa Fleming  thought about changing medications, Dr. Ladona Ridgel suggested Cardizem 180 mg  daily to start on May 22, 2008 and to hold back on Lasix 20 mg daily  and to  hold back on potassium chloride 20 mEq twice daily.  If she gains  weight during daily weight session, then we recommended that she take an  extra 20 mg daily of Lasix as a rescue and also a 20 mEq tablet of  potassium, but not unless she starts to gain weight.   DISCHARGE MEDICATIONS:  1. Alprazolam 0.5 mg t.i.d.  2. Amlodipine 5 mg daily.  3. Enteric-coated aspirin 325 mg daily.  4. Simvastatin 80 mg daily at bedtime.  5. Cardizem 180 mg daily.  A new medication.  6. Nitroglycerin 0.4 mg 1 tablet under the tongue every 5 minutes x3      doses as needed for chest pain.  7. Lasix, now as rescue 20 mg if the patient gains more than 3-4      pounds in a 36-hour period taken along with potassium chloride and      followed up with a call to [facility] at Dr. Odessa Fleming      office.   LABORATORY STUDIES THIS ADMISSION:  Once again, troponin 0.01 and 0.01.  Cholesterol 119, triglycerides 171, HDL cholesterol 29, and LDL  cholesterol 56.  Her white cells 5.2, hemoglobin 13.8, hematocrit 41.4,  and platelets of 190.  Sodium 142, potassium 4.3, chloride 108,  carbonate 27, BUN is 12, creatinine 0.7, and glucose 103.      [name], [place]      Duke Salvia, MD, [facility]  Electronically Signed    GM/MEDQ  D:  05/22/2008  T:  05/23/2008  Job:  [id number]   cc:   Lonzo Cloud. Kriste Basque, MD

## 2010-11-05 NOTE — Discharge Summary (Signed)
[name], [name]                ACCOUNT NO.:  [id number]   MEDICAL RECORD NO.:  [id number]          PATIENT TYPE:  OIB   LOCATION:  4705                         FACILITY:  [facility]   PHYSICIAN:  Doylene Canning. Ladona Ridgel, MD    DATE OF BIRTH:  [birth year]   DATE OF ADMISSION:  05/08/2006  DATE OF DISCHARGE:  05/09/2006                                 DISCHARGE SUMMARY   ALLERGIES:  THE PATIENT HAS ALLERGIES TO CODEINE AND SULFA.   PRINCIPAL DIAGNOSES:  1. Syncope of unexplained etiology.  2. Discharging day one status post implantation of loop recorder by Dr.      Ladona Ridgel.  3. Post loop  implant.  The patient had excruciating (greater than 10/10)      abdominal/chest space pain.      a.     Not responsive to nitroglycerin x2.      b.     Troponin I study x1 was 0.01.      c.     Morphine gave little help to her pain and dropped her blood       pressure.      d.     The patient received fluid resuscitation one liter of normal       saline IV.      e.     Hypokalemia with potassium 2.7 on May 08, 2006.  The       patient's potassium has been fully repleted discharge.      f.     D-dimer was less than 0.22.      g.     A chest x-ray shows no edema.  No pneumothorax.      h.     Electrocardiogram showed no ST elevations or ST depressions.  4. On telemetry this admission asymptomatic bradycardia with heart rates      in the 40s.  5. Workup prior to this admission from a cardiac standpoint.      a.     CardioNet monitor shows no episodes of bradycardia or       tachycardia.      b.     Carotid duplex ultrasound April 08, 2006; no internal carotid       artery stenosis bilaterally.      c.     Echocardiogram April 07, 2006; ejection fraction of 60-65%,       diastolic dysfunction.  No left ventricular wall motion abnormalities.      d.     Myoview study shows no ischemia.   SECONDARY DIAGNOSES:  1. Obesity.  2. Hypertension.  3. Dyslipidemia.  4. Status post cholecystectomy,  hysterectomy, back surgery, and carpal      tunnel release.   BRIEF HISTORY:  [name] is a 70 year old female.  She presented to  the hospital with syncope after a visit to her hairdresser.  She got up to  walk to another part of the beauty parlor and fell face forward without  warning.  In the emergency room she had a CT of the head and a spinal CT  both  of which were negative.  She then underwent carotid duplex ultrasound  which showed mild focal calcific plaque on the right and mixed plaque on the  left.  Echocardiogram showed normal left ventricular function.  She was sent  home for a stress Myoview and cardiac event monitor.   The Myoview study showed no evidence of ischemia and no significant scar.  Ejection fraction by echocardiogram was 60-65%.  She wore a CardioNet  monitor.  There appears to be SVT at a rate of 70 beats per minute.  She  also had baseline bradycardia.  Dr. Ladona Ridgel reviewed the CardioNet monitor  and especially the area that there was a question of supraventricular  tachycardia.  He suspected that this was in fact artifact based on the  bizarre morphology of the QRS than the irregularity and the noisy baseline.   The etiology of the syncopal symptoms is unclear.  Her abnormal cardiac  monitor represents noise artifact.  The patient is not having much in the  way of palpitations and it is recommended that she undergo implantation of a  loop recorder because of recurrent episodes of unexplained syncope.   HOSPITAL COURSE:  The patient presented May 08, 2006.  Electively she  underwent implantation of a loop recorder by Dr. Ladona Ridgel.  The procedure  itself was unremarkable and the incision is healing nicely without evidence  of hematoma.  However, back in the postoperative period and short stay at  [facility] the patient was having intractable, refractory and excruciating  chest pain which would migrate at times into the abdomen.  She felt weak in   both the arms and legs at times.  This was a waxing and waning kind of  feeling that made her extremities feel as if they were made of jelly and  heavy to left.  She had no radiation of the pain to the neck or arms.  No  diaphoresis or nausea.  Electrocardiogram showed no ST elevations or  depressions.  She was in sinus bradycardia.  She was given two nitroglycerin  at 5-minute intervals.  These help moderate the pain only a very minor  amount.  The patient was placed on oxygen.  Her saturation on oxygen was  98%.  She was given a fluid bolus with IV normal saline of 1000 mL after a  consultation with the doctor today, Dr. Daleen Squibb, and this allowed [place] to give  morphine since with her first a dose of morphine 3 mg her pressure dropped  into the 90s without much relief of her pain.  She was given an additional 4  mg of morphine and this calmed her, but she would still have these episodes  of racking pain.  The D-dimer was obtained.  It was less than 0.22, troponin  I study was obtained x1.  It was 0.01.  A follow-up electrocardiogram showed  no evidence of ST elevation or depression.  She did have a complete blood  count and a serum electrolyte study.  The potassium turned out to be 2.7.  Immediate plans were made to replete her potassium. She was also given 20 mL  of IV Pepcid and plans were made to admit the patient for overnight  observation.   On transmittal to the telemetry floor the patient did have emesis of bilious  fluid x2 and made her feel better.  She actually at the time of transfer to  telemetry had no chest pain or abdominal pain but felt washed out and she  did look pale on examination.  IV potassium replenishment continued through  the afternoon on May 08, 2006 and into the early evening with the  patient in n.p.o. status except for full liquids.  The patient actually did not feel much like eating at any rate.  On the morning of May 09, 2006  the patient's potassium  is 3.3.  More aggressive measures were taken to  replenish the potassium.  She has actually received 100 mEq of potassium  orally.  She received Reglan before each meal and actually at lunchtime had  a large helping.  Complaining somewhat of some diarrhea now, but no further  nausea and no further chest pain.  Her color is also much improved as is her  affect.  She is cheerful and ready to go home after lunch on May 09, 2006.  It is possible that in the setting of severe hypokalemia the patient  could have some esophageal spasm or muscle tetany which caused this  noncardiac chest pain.   MEDICATIONS:  1. She is on Maxzide at home and this has been discontinued.  The patient      states that she is on Maxzide because she has  swelling in the lower      extremities and to help with this.  2. The patient is asked to take Lasix 20 mg only if she sees swelling or      an increase in her weight, 20 mg 1 tablet daily.  For every Lasix she      takes she is to take potassium chloride 20 mEq.  Her other medications      are to continue as they were.  3. Zocor 80 mg daily at bedtime.  4. Norvasc 5 mg daily.  5. Tranxene 7.5 mg 3x daily.  6. Enteric-coated aspirin 325 mg daily.  7. Ibuprofen 2 mg at bedtime.   Once again she is to stop taking Maxzide and to stop taking her home dose of  potassium which is 99 mg per tablet.  She has a follow-up with [facility], [address], to see Dr. Ladona Ridgel Thursday, May 18, 2006 at 12:15.  she will have a B-met taken prior to that admission on that  day.   Laboratory studies on the morning of postprocedure day one after  implantation of loop recorder; potassium was 3.3 and the patient received  100 mEq supplementation and is not taking any diuretic.  Her sodium was 132,  chloride 98, carbonate 28, BUN is 6, creatinine 0.7, glucose is 103.  Complete blood count this admission; hemoglobin 12.6, hematocrit 37.1, white  cells  5.6, platelets 197.  Troponin I study was 0.01 in the setting of chest  pain.  The D-dimer was less than 0.22.      Maple Mirza, PA      Doylene Canning. Ladona Ridgel, MD  Electronically Signed    GM/MEDQ  D:  05/09/2006  T:  05/09/2006  Job:  [id number]   cc:   Doylene Canning. Ladona Ridgel, MD  Lonzo Cloud. Kriste Basque, MD

## 2010-11-05 NOTE — Op Note (Signed)
NAMESHARICKA, [name]                ACCOUNT NO.:  [id number]   MEDICAL RECORD NO.:  [id number]          PATIENT TYPE:  OIB   LOCATION:  2899                         FACILITY:  [facility]   PHYSICIAN:  Doylene Canning. Ladona Ridgel, MD    DATE OF BIRTH:  21-Sep-1940   DATE OF PROCEDURE:  05/08/2006  DATE OF DISCHARGE:                                 OPERATIVE REPORT   PROCEDURE PERFORMED:  Insertion of implantable loop recorder.   INDICATIONS:  Unexplained syncope.   INTRODUCTION:  The patient is a 70 year old woman with a history of  unexplained syncope with preserved LV function and nonobstructive coronary  disease and hypertension who is now referred for insertion of an implantable  loop recorder.  Her cardiac monitor demonstrated no obvious cause for  syncope and no obvious arrhythmias.   PROCEDURE:  After informed consent was obtained, the patient was taken to  the diagnostic EP lab in the fasting state.  After the usual preparation and  draping, 30 mL of lidocaine was infiltrated into the left pectoral region.  A 3 cm incision was carried out over this region.  Electrocautery utilized  to dissect down to the fascial plane.  A subcutaneous pocket was made with  electrocautery.  The electrocautery was utilized to assure hemostasis and  the pocket was irrigated with Kanamycin.  The Medtronic Reveal Plus model  9526 loop recorder, serial number [id number] was placed in the subcutaneous  pocket and secured with two silk sutures.  Additional kanamycin was utilized  to irrigate the pocket and the incision closed with a layer of 2-0 Vicryl,  followed by a layer of 3-0 Vicryl, followed by a layer of 4-0 Vicryl.  Benzoin was painted on the skin.  Steri-Strips were applied and pressure  dressing was placed.  The patient was returned to her room in satisfactory  condition.   COMPLICATIONS:  There were no immediate procedure complications.   RESULTS:  This demonstrates successful implantation of a  Medtronic  implantable loop recorder in a patient with unexplained syncope.      Doylene Canning. Ladona Ridgel, MD  Electronically Signed     GWT/MEDQ  D:  05/08/2006  T:  05/08/2006  Job:  [id number]   cc:   Bevelyn Buckles. Bensimhon, MD

## 2010-11-05 NOTE — Assessment & Plan Note (Signed)
[name]                         ELECTROPHYSIOLOGY OFFICE NOTE   [name], [name]                       MRN:          [id number]  DATE:05/18/2006                            DOB:          [birth year]    [name] returns today for followup.  She is a very pleasant woman with  a history of unexplained syncope who has a history of preserved LV  function by echo, though her EF by stress testing was 37%.  The patient  has hypertension and underwent an implantable loop recorder insertion.  She had chest pain following her procedure and was hospitalized over  night where her cardiac enzymes were negative and her symptoms resolved.  She returns today for followup. She has done well and she denies chest  pain.  She had 2 spells that she described as mild without syncope but  she did not correctly activate her loop recorder with.   On exam today, she is a pleasant, obese, 70 year old woman in no  distress.  Blood pressure was 134/79, the pulse 79 and regular.  The respirations  were 18 and the weight was 196 pounds.  NECK:  No jugular venous distention.  LUNGS:  Clear bilaterally to auscultation.  CARDIOVASCULAR:  Regular rate and rhythm with normal S1 and S2.  EXTREMITIES:  No cyanosis, clubbing, or edema.  The pulses were 2+ and  symmetric.  Her implantable loop recorder insertion site was healing nicely.   IMPRESSION:  1. Unexplained syncope.  2. Hypertension.  3. Obesity.  4. Diabetes.   DISCUSSION:  Overall, [name] is stable.  She has had no recurrent  syncope.  We will plan to see her back in the office in 4-6 months.     Doylene Canning. Ladona Ridgel, MD  Electronically Signed    GWT/MedQ  DD: 05/18/2006  DT: 05/19/2006  Job #: [id number]

## 2010-11-05 NOTE — H&P (Signed)
[name], [name]                ACCOUNT NO.:  [id number]   MEDICAL RECORD NO.:  [id number]          PATIENT TYPE:  OIB   LOCATION:  4705                         FACILITY:  [facility]   PHYSICIAN:  Maple Mirza, PA   DATE OF BIRTH:  May 27, 1941   DATE OF ADMISSION:  05/08/2006  DATE OF DISCHARGE:                                HISTORY & PHYSICAL   PRIMARY CAREGIVER:  Dr. Kriste Basque.   CARDIOLOGIST:  Dr. Lewayne Bunting.   PRESENTING CIRCUMSTANCE:  I'm here for a procedure, but I'm unsure exactly  what it's all about.   HISTORY OF PRESENT ILLNESS:  [name] is a 70 year old female with a history  of syncope at her hairdresser on April 07, 2006.  She has no prior cardiac  history, except she has known hypertension and dyslipidemia.  No significant  trauma was displayed at the time of the incident.  She had no warning that  this was about to occur and had no post-syncope fatigue, confusion, bowel or  bladder incontinence.  Patient was admitted to [facility] that same  day, had no further episodes, the monitor was nondiagnostic for cardiac  etiology.  Patient has had followup with [facility].  Electrocardiogram shows normal sinus rhythm.  She has worn a CardioNet  monitor showing no episodes of bradycardia/tachycardia.  There was some  evidence of artifact.  She had a duplex study April 08, 2006, which showed  no internal carotid artery stenosis.  Echocardiogram October 19th shows an  ejection fraction of 60% to 65% with no left ventricular wall motion  abnormalities.  There was evidence of diastolic dysfunction.  She apparently  had a Cardiolite study, which showed no ischemia.  Apparently, the patient  has had some episodes of syncope in the past.  They have typically no  warning.  She described these as a falling out.  Patient also describes  palpitations, especially when she exerts herself.  She has seen Dr. Ladona Ridgel  on November 14th.  He recommended that she undergo  implantation of a loop  recorder because of recurrent episodes of unexplained syncope.   ALLERGIES:  Patient has ALLERGIES to CODEINE and SULFA.   MEDICATIONS:  Include:  1. Zocor 80 mg daily.  2. Norvasc 5 mg daily.  3. Triamterene/hydrochlorothiazide 37.5/25 daily.  4. Tranxene 7.5 mg t.i.d.  5. Enteric-coated aspirin 325 mg daily.  6. Ibuprofen 200 mg at bedtime.  7. Potassium 99 mg daily.   REVIEWING SYSTEMS:  The patient does not have fevers, chills, night sweats.  She has not had any significant weight loss or gain uncontrollably.  She has  had no epistaxis, hoarseness, or vertigo.  She has no ulcerations or  nonhealing ulcers on the lower extremities or any rashes.  She is not having  chest pain, shortness of breath with exertion, orthopnea, paroxysmal  nocturnal dyspnea.  She does not relate lower extremity edema.  She has a  history of syncope as I have given in the history of present illness above  and palpitations which are occasional and mostly with exertion.  She has  no  urinary problems to speak of.  No gastrointestinal complaints, such as  bleeding internally or gastroesophageal reflux.  She does have anxiety, for  which she takes Tranxene.  No neurologic deficits noted.   EXAMINATION:  GENERAL:  The patient is alert and oriented x3.  VITAL SIGNS:  Temperature 97.0, pulse is 63, respirations 16, blood pressure  129/57, oxygen saturation is 96% on room air.  She is 5 feet 3 inches and  weighs about 165 pounds.  HEENT:  Eyes:  Pupils equal, round, reactive to light.  Extraocular  movements are intact.  NECK:  Supple.  No jugular venous distention.  No carotid bruits.  LUNGS:  Clear to auscultation bilaterally.  HEART:  Regular rate and rhythm.  ABDOMEN:  Mild obesity.  Bowel sounds are present.  Abdominal aorta and  internal organs not palpated.  EXTREMITIES:  Show no evidence of clubbing, cyanosis, or edema.  NEUROLOGIC:  Intact.   IMPRESSION:  Unexplained  syncope.   PLAN:  Implant loop recorder.      Maple Mirza, PA     GM/MEDQ  D:  05/08/2006  T:  05/08/2006  Job:  [id number]

## 2010-11-05 NOTE — Assessment & Plan Note (Signed)
[facility]                                 ON-CALL NOTE   [name], [name]                         MRN:          [id number]  DATE:06/14/2006                            DOB:          09-06-1940    I received a page through the answering service from [name]  concerning her mother, [name], who is a patient of Dr. Lubertha Basque.  The page came in June 14, 2006, at 2032.  I returned the call  promptly and spoke with [name].  She stated her mother is a patient  of Dr. Lubertha Basque and that she is not breathing right and having chest  pain.  They had already called EMS and that EMS had arrived at the house  and the patient was being prepared for transport to [facility] for  further evaluation.  I instructed [name] to notify the paramedics to  have [facility] called when the patient arrived.      Dorian Pod, ACNP  Electronically Signed      Luis Abed, MD, [facility]  Electronically Signed   MB/MedQ  DD: 06/14/2006  DT: 06/14/2006  Job #: [id number]

## 2010-11-05 NOTE — H&P (Signed)
NAMEREMMI, ARMENTEROS                ACCOUNT NO.:  [id number]   MEDICAL RECORD NO.:  [id number]          PATIENT TYPE:  INP   LOCATION:  3741                         FACILITY:  [facility]   PHYSICIAN:  Dorian Pod, ACNP  DATE OF BIRTH:  [birth year]   DATE OF ADMISSION:  06/14/2006  DATE OF DISCHARGE:  06/15/2006                              HISTORY & PHYSICAL   PRIMARY CARDIOLOGIST:  Lewayne Bunting, MD.   PRIMARY CARE:  Alroy Dust, MD.   SUBJECTIVE:  [name] is a 70 year old Caucasian female followed by Dr.  Lewayne Bunting for syncopal episodes.  [name] presents to [facility]  Emergency Room this evening complaining of chest discomfort and  shortness of breath.  [name] states she has felt fine over the last few  days.  She woke up this morning feeling short of breath.  She states it  just felt like it was hard to take a deep breath.  Had no chest  discomfort at that time.  Then this evening around 7:20 she was watching  TV.  She states she had some substernal chest pressure.  She described  it as an ache associated with the pressure of heaviness on her chest  radiating up into her jaw bilaterally.  She states it lasted around 5  minutes; however, she became diaphoretic and nauseated.  She thought she  was going to vomit but did not.  Husband called 911, and I spoke with  EMS.  The paramedic reports that when they arrived she checked a manual  blood pressure on patient and found her to have a systolic of 73.  She  laid the patient down, rechecked blood pressure.  Systolic had increased  to 120.  The patient states the chest discomfort was fleeting during  this time.  She states that she has just felt groggy throughout the  episode of chest discomfort.  She currently is rating her chest pain of  5 on a scale of 1 to 10 with no acute ST or T wave changes on EKG.   PAST MEDICAL HISTORY:  She has had approximately 3 episodes of syncope  over the last 6 months of unexplained etiology.  She  is status post  implantation of a loop recorder by Dr. Ladona Ridgel in November.  Apparently  patient had some episode of chest discomfort during that admission;  however, patient today denies that she had any chest pain then.  Apparently had a previous workup for her syncope, a CardioNet monitor  that showed no episodes of bradycardia or tachycardia, or carotid duplex  ultrasound in October that showed no internal carotid artery stenosis  bilaterally, and an echocardiogram in October that showed a normal EF  diastolic dysfunction, no left ventricular wall motion abnormalities,  and a Myoview study done in our office that apparently showed no  ischemia.   ALLERGIES:  Include CODEINE and SULFA.   MEDICATIONS:  Include:  1. Zocor 80.  2. Norvasc 5.  3. KCL 99 mg daily.  4. Aspirin 325.  5. Lasix 20 mg every other day.  6. Tranxene 7.5 mg  at bedtime.   PAST MEDICAL HISTORY:  Includes:  1. Syncope x3 episodes, status post implantation of loop recorder in      November.  2. Hypertension.  3. Dyslipidemia.  4. Remote history of coronary artery disease.  Patient states she      underwent a cardiac catheterization here in 1996, and was told that      she had nonobstructive disease at that time.  5. Fibromyalgia.  6. Anxiety.  7. Obesity.  8. Status post cholecystectomy, hysterectomy, back surgery, and carpal      tunnel release.   SOCIAL HISTORY:  She lives in [place] with her husband.  She has 4  adult children.  Denies any tobacco, EtOH, drug, or herbal medication  use.  No routine exercise.  Tries to follow a heart healthy diet.   FAMILY HISTORY:  Mother deceased in her 52s secondary to complications  of diabetes, father deceased secondary to TB, and siblings with no known  coronary artery disease.   REVIEW OF SYSTEMS:  Positive for sweats, headache, chest pain, shortness  of breath, dyspnea on exertion, some peripheral edema, and syncopal  episodes as stated in history of  present illness, nausea, and all other  systems negative per patient.   PHYSICAL EXAMINATION:  Patient is in no acute distress.  Pulse is 70.  Respirations 18 with a blood pressure 127/53.  She sating  95% on 2 liters.  Normocephalic, atraumatic.  Pupils equally round and reactive to light.  Sclerae are clear.  NECK:  Supple without lymphadenopathy.  Negative bruit.  Negative JVD.  CARDIOVASCULAR EXAM:  S1, S2.  Regular rate and rhythm.  Pulses are 2+  and equal without bruits.  Lungs are clear to auscultation bilaterally.  ABDOMEN:  Soft, nontender.  Positive bowel sounds.  Lower extremities without clubbing, cyanosis.  She has a trace of edema  in the lower extremities.  Neurologically she is alert and oriented x3.  Cranial nerves II-XII  grossly intact.  Chest x-ray is pending.  EKG showing sinus rhythm with no acute ST or T wave changes.  Lab work is pending.   Dr. Sherryll Burger in to exam and assess patient with complaints of chest  discomfort, questionable etiology at this time.  Also with history of  hypertension, dyslipidemia, and syncope of unknown etiology.   PLAN:  Plan is to admit patient to telemetry.  Cycle cardiac markers.  Continue heparin, nitroglycerin, aspirin.  Patient is already on a  Statin.  Will continue calcium channel blocker.  Hold on beta-blocker at  this time.  Also continue Lasix.  If patient has positive cardiac  markers, will plan on cardiac catheterization.  Also check a D-dimer and  a BMP level.      Dorian Pod, ACNP     MB/MEDQ  D:  06/14/2006  T:  06/15/2006  Job:  [id number]

## 2010-11-05 NOTE — Assessment & Plan Note (Signed)
[facility]                                 ON-CALL NOTE   ADEANA, GRILLIOT                         MRN:          [id number]  DATE:06/30/2006                            DOB:          December 21, 1940    SUBJECTIVE:  Ms. Faerber calls tonight complaining that she is feeling  bad.  She is unable to give me specifics in terms of whether she  hurts, or whether she has aches in any particular place.  She just  states that she feels bad.  Apparently, she recently got out of the  hospital where she had complete cardiac evaluation that she said was  unremarkable.  She has been feeling this way for quite some time, and  has had multiple evaluation with apparently nothing being found by her  history.  Patient denies any fevers, chills, sweats, chest pain, cough,  or increased shortness of breath.   IMPRESSION:  Malaise of unknown etiology.  The patient has had multiple  hospitalizations and workups recently with nothing, apparently, being  found.  However, I have told the patient that if she feels that she has  worsened, and that she must be seen by a physician tonight, that she is  to go to the emergency room immediately.  I have told Ms. Markell that I am  not able to offer a lot of advice to her over the telephone because I  have never seen her before, and obviously this has been going on for  some time, and has already been evaluated.  However, I have reiterated  to her again to go to the emergency room if she feels that she has  worsened from her prior baseline.     Barbaraann Share, MD,FCCP  Electronically Signed    [facility]/MedQ  DD: 06/30/2006  DT: 07/01/2006  Job #: [id number]   cc:   Lonzo Cloud. Kriste Basque, MD

## 2010-11-05 NOTE — H&P (Signed)
[name], [name]                ACCOUNT NO.:  [id number]   MEDICAL RECORD NO.:  [id number]          PATIENT TYPE:  INP   LOCATION:  2029                         FACILITY:  [facility]   PHYSICIAN:  Tanya Friends. Dietrich Pates, MD, FACCDATE OF BIRTH:  [birth year]   DATE OF ADMISSION:  09/25/2006  DATE OF DISCHARGE:                              HISTORY & PHYSICAL   PRIMARY CARE PHYSICIAN:  Lonzo Cloud. Kriste Basque, MD   PRIMARY CARDIOLOGIST:  Doylene Canning. Ladona Ridgel, MD   CHIEF COMPLAINT:  Chest pain/presyncope.   HISTORY OF PRESENT ILLNESS:  [name] is a 70 year old female with a  history of syncope x4.  She was playing cards today and felt sudden  onset of a flushing feeling over her entire body.  She also complained  of a headache she described as a pressure-type pain.  She complained of  nausea and diaphoresis as well.  Her symptoms began at approximately  1:30 p.m.  Her husband insisted her to lie down because she was very  weak.  She woke up with her daughter calling her name and her daughter  calling 9-1-1.  Her other syncopal episodes have not been associated  with any prodrome, and she does not feel that she passed out this time  but just feels that she fell asleep.  She had no palpitations.  She woke  up extremely weak and complained of nausea, shortness of breath and  diaphoresis and at that time also had chest pain at a 5/10.  He was in  her mid chest and radiated through to her back.  She has had chest pain  before and her previous chest pain episode in December2007 was  associated with an overnight admission, cardiac enzymes negative for MI.  Her last ischemic evaluation was a Myoview in November2007 that is  reportedly negative, and she has had an echocardiogram previously and  preserved left ventricular function.Marland Kitchen   PAST MEDICAL HISTORY:  She has a history of hypertension as well as  hyperlipidemia and obesity.  She has no history of diabetes, although  she reports a history of hypoglycemia.   There is no history of tobacco  use and no family history of premature coronary artery disease.  She is  status post cardiac catheterization reportedly in 1996 but no  percutaneous intervention.  The patient also reports a history of mitral  valve prolapse.  She has a long history of palpitations, and PVCs were  seen on a previous loop recorder interrogation.  She has a history of  fibromyalgia.  An adenosine Myoview was performed in November2007  that was without ischemia.   SURGICAL HISTORY:  She is status post cardiac catheterization as well as  tonsillectomy and adenoidectomy, cholecystectomy, hysterectomy, back  surgery and right hand surgery.   ALLERGIES:  She is allergic or intolerant to SULFA and CODEINE.   CURRENT MEDICATIONS:  1. Norvasc 5 mg a day.  2. 80 mg a day.  3. Aspirin 325 mg daily.  4. Lasix 20 mg a day.  5. Potassium 20 mEq a day.  6. Clorazepate 7.5 mg q.h.s.  SOCIAL HISTORY:  She lives in [place] with her husband and is retired  from Warehouse manager work at a [employer].  She has no history of  alcohol, tobacco or drug abuse.   FAMILY HISTORY:  Her mother died in her 61s without any history of  coronary artery disease.  Her father died in his 75s with a history of  tuberculosis and pneumonia after returning from [place] War [place].  He had no  history of coronary artery disease, and she has no siblings with any  history of coronary artery disease.   REVIEW OF SYSTEMS:  She denies any history of recent fevers, chills or  other illnesses.  She has not had any episodes of chest pain since  December.  She has chronic dyspnea on exertion that is not recently  changed.  She did not have palpitations today.  She has no urinary  symptoms.  She has chronic arthralgias because of the fibromyalgia.  She  has no reflux symptoms and no hematemesis, hemoptysis or melena.  Review  of systems is otherwise negative.   PHYSICAL EXAMINATION:  VITAL SIGNS: Temperature is  97.8, blood pressure  114/58, pulse 49, respiratory rate 18, O2 saturation 98% on 2 L.  GENERAL:  She is a well-developed, obese white female in no acute  distress.  HEENT:  Her head is normocephalic and atraumatic with  extraocular movements intact.  Sclerae clear.  Nares without discharge.  NECK:  There is no lymphadenopathy, thyromegaly, bruits or JVD noted.  CV:  Heart is regular in rate and rhythm with an S1 and S2 and no  significant murmur, rub or gallop is noted.  Distal pulses are 2+ in all  four extremities, and no femoral bruits are appreciated.  LUNGS:  Essentially clear to auscultation bilateral.  SKIN:  No rashes or lesions are noted.  ABDOMEN:  Soft and nontender with active bowel sounds.  EXTREMITIES:  There is no cyanosis, clubbing or edema noted.  MUSCULOSKELETAL:  There is no joint deformity or effusions and no spine  or CVA tenderness.  NEUROLOGIC:  She is alert and oriented with cranial nerves II-XII  grossly intact.   Chest x-ray is pending.   EKG is sinus bradycardia, rate 51, with no acute ischemic changes and is  unchanged from EKG dated September2007.   LABORATORY VALUES:  Hemoglobin 13.4, hematocrit 39.7, WBC 8.7, platelets  267.  Sodium 136, potassium 3.2, chloride 103, BUN 22, creatinine 1.1,  glucose 101.   IMPRESSION:  1. Chest pain:  She will be admitted.  Cardiac enzymes will be ruled      out.  Further evaluation and treatment will depend on the results      of her cardiac enzymes.  At this point no catheterization is      planned.  2. Presyncope:  [employer] has been contacted for a loop recorder      interrogation.  Per office notes, previous interrogation showed      only PVCs.  Orthostatic vital signs are scheduled and a tilt table      is scheduled as well.  3. Bradycardia:  Her heart rate has dropped into the 40s but is not      sustained there.  It does sustain in the low 50s.  She is not on     any rate-lowering medication.  This will be  followed.  4. Hypokalemia:  She received 40 mEq of potassium in the emergency      room, and her  home dose has been increased to 40 mEq a day.  5. [name] is otherwise stable and will be continued on her home      medications.      Theodore Demark, PA-C      Tanya Friends. Dietrich Pates, MD, [facility]  Electronically Signed    RB/MEDQ  D:  09/25/2006  T:  09/26/2006  Job:  [id number]   cc:   Lonzo Cloud. Kriste Basque, MD

## 2010-11-05 NOTE — Assessment & Plan Note (Signed)
[place] [facility]                           ELECTROPHYSIOLOGY OFFICE NOTE   [name], [name]                       MRN:          [id number]  DATE:05/03/2006                            DOB:          [birth year]    ELECTROPHYSIOLOGY CONSULTATION   REFERRING PHYSICIAN:  Dr. Corinda Gubler.   DATE OF CONSULTATION:  May 03, 2006.   INDICATIONS FOR CONSULTATION:  Evaluation of patient with history of  syncope.   HISTORY OF PRESENT ILLNESS:  The patient is a very pleasant 70 year old  woman who was hospitalized several weeks ago after going to her hair dresser  and passing out.  She had multiple ecchymotic areas over her face but  apparently had no significant damage.  The patient has preserved left  ventricular function by echocardiogram.  There is a Cardiolite that  demonstrates no obvious ischemia.  The patient was apparently well until  several years ago when she experienced an episode of syncope and  subsequently has had at least three episodes in the last four years.  There  is typically no warning and the patient describes these as falling out.  She does not have palpitations associated with her syncope.  She has been  wearing a cardiac monitor, which has demonstrated a questionable area of  supraventricular tachycardia, though on review of this, I suspect it is, in  fact artifact based on the bizarre morphology of the QRS signals, the  irregularity and the noisy baseline.   MEDICATIONS:  The patient's medications include:  1. Zocor.  2. Norvasc.  3. Triamterene/hydrochlorothiazide.  4. Clorazepate.  5. Aspirin.  6. Ibuprofen.  7. Potassium supplements.   FAMILY HISTORY:  Her family history is negative for premature coronary  disease.  Includes diabetes.  Her father died in his 8's of pneumonia.   SOCIAL HISTORY:  She denies tobacco or ethanal abuse.  She lives in  [place] and is married.  She is a housewife.   PAST SURGICAL HISTORY:   Notable for a cholecystectomy, hysterectomy, back  surgery and carpal tunnel release.   REVIEW OF SYSTEMS:  The patient has very mild problems with anxiety which is  controlled with clorazepate.  She has dyspnea on exertion.  She has very  minimal amounts of lower extremity edema controlled with he diuretic.  The  rest of her systems were reviewed and found to be negative except as noted  in the history of present illness.   PHYSICAL EXAMINATION:  GENERAL APPEARANCE:  She is a pleasant, well-  appearing middle-aged woman in no acute distress.  VITAL SIGNS:  Blood pressure was 144/82. Pulse 64 and regular.  Respirations  were 18.  Weight was 194 pounds.  HEENT:  Normal.  NECK:  Reveals no jugular venous distention.  There is no thyromegaly.  Trachea is midline.  The carotids are 2+ and symmetric.  LUNGS:  Clear bilaterally to auscultation.  There are no wheezes, rales or  rhonchi.  There is no increased work of breathing.  CARDIOVASCULAR:  Exam reveals a regular rate and rhythm with normal S1, S2.  The PMI was not enlarged  nor was it laterally displaced.  ABDOMEN:  Exam is obese, nontender, nondistended.  There is no organomegaly.  The bowel sounds are present.  There is no rebound or guarding.  EXTREMITIES:  Demonstrated no cyanosis, clubbing or edema.  The pulses were  2+ and symmetric.  NEUROLOGICAL:  Alert and oriented x3.  Cranial nerves II-XII are intact.  Strength is 5/5 and symmetric.   CLINICAL DATA:  Electrocardiogram demonstrates sinus rhythm with possible  left atrial enlargement.   IMPRESSION:  1. Recurrent syncope.  2. Probable noise artifact on cardiac monitor.  3. Hypertension.  4. Obesity.   DISCUSSION:  The etiology of Ms. Wulff's symptoms is unclear.  I think that  her abnormal cardiac monitor represents noise artifact.  Because she has had  really not much in the way of palpitations, I have recommended that she  undergo implantation of an implantable loop  recorder because of her current  episodes of unexplained syncope.  I will plan to see her back after this.     Doylene Canning. Ladona Ridgel, MD  Electronically Signed    GWT/MedQ  DD: 05/03/2006  DT: 05/03/2006  Job #: [id number]   cc:   Bevelyn Buckles. Bensimhon, MD

## 2010-11-05 NOTE — Discharge Summary (Signed)
NAMECORNELIOUS, DIVEN                ACCOUNT NO.:  [id number]   MEDICAL RECORD NO.:  [id number]          PATIENT TYPE:  OBV   LOCATION:  2029                         FACILITY:  [facility]   PHYSICIAN:  Doylene Canning. Ladona Ridgel, MD    DATE OF BIRTH:  August 25, 1940   DATE OF ADMISSION:  09/25/2006  DATE OF DISCHARGE:                               DISCHARGE SUMMARY   ADDENDUM DISCHARGE SUMMARY   ALLERGIES:  Patient has allergies to SULFA and CODEINE.   Basically, the patient came in with a potassium of 3.2.  She was on  Lasix 20 mg, potassium chloride 20 mEq.  She was given an extra 20 mEq  or 40 mEq at admission.  Her potassium on the day of discharge is 4.4.  She is going home on potassium chloride 30 mEq daily.  We will request a  BMET at the office of Dr. Ladona Ridgel on Oct 31, 2006.      Maple Mirza, PA      Doylene Canning. Ladona Ridgel, MD  Electronically Signed    GM/MEDQ  D:  09/27/2006  T:  09/27/2006  Job:  [id number]   cc:   Lonzo Cloud. Kriste Basque, MD  Doylene Canning. Ladona Ridgel, MD

## 2010-11-05 NOTE — Op Note (Signed)
[name], [name]                ACCOUNT NO.:  [id number]   MEDICAL RECORD NO.:  [id number]          PATIENT TYPE:  INP   LOCATION:  2029                         FACILITY:  [facility]   PHYSICIAN:  Doylene Canning. Ladona Ridgel, MD    DATE OF BIRTH:  1940/08/14   DATE OF PROCEDURE:  09/26/2006  DATE OF DISCHARGE:                               OPERATIVE REPORT   PROCEDURE PERFORMED:  Head-up tilt table testing.   INDICATIONS:  Unexplained syncope.   I. INTRODUCTION:  The patient is a very pleasant 70 year old woman with  a history of recurrent syncope, who was admitted to hospital with the  same problem.  The patient had underwent implantable loop recorder  insertion several months ago.  She presented again after having episode  of nausea and diaphoresis followed by a period of altered consciousness.  She notes that when she awoke the paramedics had arrived and she is not  sure how long she was unconscious or unresponsive.  She is now referred  for head-up tilt table testing.   II. PROCEDURE:  After informed consent was obtained, the patient was  taken to the diagnostic EP lab in the fasting state.  After the usual  preparation, she was placed in the supine position.  The initial blood  pressure was 140/80.  The pulse was in the high 60s.  After 5 minutes,  she was placed in the 70-degree head-up tilt table position and her  blood pressure decreased slightly over the next 10 minutes down to a low  107/65.  During this time, the heart rate increased up into the mid 70s.  During her head-up tilting, she became diaphoretic and felt hot and  nauseated; however, she maintained her blood pressure and heart rate  with very little fluctuation.  After 45 minutes of tilting, the patient,  whose blood pressure of at that time was still in the 120 range and her  heart rate in the mid 70s, was placed back in supine position, where her  heart rate went back to the 55-beat-per-minute range and there was no  significant change in her blood pressure.  She was subsequently returned  to her room in satisfactory condition.   III. COMPLICATIONS:  There were no immediate procedure complications.   IV. RESULTS:  This demonstrates a negative head-up tilt table test in a  patient with prior unexplained syncope, status post implantable loop  recorder insertion.      Doylene Canning. Ladona Ridgel, MD  Electronically Signed     GWT/MEDQ  D:  09/26/2006  T:  09/27/2006  Job:  [id number]   cc:   Lonzo Cloud. Kriste Basque, MD

## 2010-11-05 NOTE — Assessment & Plan Note (Signed)
[facility]                         ELECTROPHYSIOLOGY OFFICE NOTE   [name], [name]                       MRN:          [id number]  DATE:07/31/2006                            DOB:          [birth year]    [name] returns today for followup.  She is a very pleasant lady with  recurrent unexplained syncope who is status post insertion of an  implantable loop recorder.  She has moderate LV dysfunction by echo.  She has hypertension, obesity, and diabetes.  The patient returns for  followup.  She has had no intercurrent syncope.  She did have some  shortness of breath and activated her loop recorder but this  demonstrated only PVCs.   EXAM:  She is a pleasant, middle-aged woman in no distress.  Blood pressure 128/72.  The pulse 70 and regular.  The respirations were  18 and the weight was 194 pounds.  NECK:  No jugular venous distention.  LUNGS:  Clear bilaterally to auscultation.  CARDIOVASCULAR:  Regular rate and rhythm with a normal S1 and S2.  EXTREMITIES:  No edema.   Interrogation of her loop recorder demonstrates no brady or tachy  episodes.   IMPRESSION:  1. Unexplained syncope.  2. Status post insertion of an implantable loop recorder.   DISCUSSION:  Overall, Ms. Karpowicz is stable.  Her loop recorder is working  normally.  We will plan to see her back in 6 months, sooner should she  have a recurrent syncopal episode.     Doylene Canning. Ladona Ridgel, MD  Electronically Signed    GWT/MedQ  DD: 07/31/2006  DT: 07/31/2006  Job #: [id number]   cc:   Lonzo Cloud. Kriste Basque, MD

## 2010-11-05 NOTE — Discharge Summary (Signed)
[name], [name]                ACCOUNT NO.:  [id number]   MEDICAL RECORD NO.:  [id number]          PATIENT TYPE:  INP   LOCATION:  3741                         FACILITY:  [facility]   PHYSICIAN:  Lonzo Cloud. Kriste Basque, MD     DATE OF BIRTH:  10-06-1940   DATE OF ADMISSION:  06/14/2006  DATE OF DISCHARGE:  06/15/2006                               DISCHARGE SUMMARY   ADDENDUM:  This addendum concerns the patient's potassium level on  admission was 3.2.  The patient says that she takes Lasix 20 mg every  other day and potassium chloride 10 mEq daily and is somewhat concerned  about how she should go forward with this.  I have just reiterated that  she continue the 20 mg daily of the Lasix and 10 mEq of potassium and  that follow-up appointment with Dr. Kriste Basque on January 2 at 10:30, a basic  metabolic panel will be taken to the further image to see if her  potassium is indeed low on this dosage and if she needs to adjust  slightly. She says the Lasix does help a lot in keeping her swelling  down.  The patient overall was fairly anxious and has been hospitalized  for the last 36 hours with atypical chest pain with negative study for  pulmonary embolism, negative troponin I studies, electrocardiogram which  shows sinus rhythm with no ST elevations, and somewhat smoldering course  of chest pain that seems to involve more the upper left chest and  collarbone area. It is controlled with Percocet.  She will go home with  Percocet and hopefully this is a transient atypical chest pain.      Maple Mirza, PA      Scott M. Kriste Basque, MD  Electronically Signed    GM/MEDQ  D:  06/15/2006  T:  06/15/2006  Job:  [id number]   cc:   Lonzo Cloud. Kriste Basque, MD  Doylene Canning. Ladona Ridgel, MD

## 2010-11-05 NOTE — Assessment & Plan Note (Signed)
[facility]                                 ON-CALL NOTE   [name], [name]                       MRN:          [id number]  DATE:09/27/2006                            DOB:          12-20-1940    Primary cardiologist:  Doylene Canning. Ladona Ridgel, MD  Date of telephone contact:  September 27, 2006.   [name] is a 70 year old female who has a history of syncope.  She had  a presyncopal episode and was hospitalized from April 7-9, 2008, for  this.  During this admission she had a tilt table test as well as  orthostatic vital signs, which were all negative.  She was discharged  today.  Tonight she called stating that she was having the same  symptoms.  She stated that she was having weakness and that her speech  was a little slurred, as she felt she was having trouble forming words.  She felt the weakness was bilateral and her family was at the house with  her.  She stated that the symptoms had come on suddenly as they had the  other day and wanted to know what she should do.   I discussed the situation with [name] and her daughter.  I advised  them that the best possible course of action was to call 9-1-1 and come  to the emergency room to be evaluated.  I advised them that although Dr.  Ladona Ridgel had not found any cause from a cardiac standpoint for her  symptoms, she should be evaluated by internal medicine for other causes.  I advised that as she was having symptoms at this time, she should not  drive herself or have her family drive her but call EMS.  They stated  they would comply with this.      Theodore Demark, PA-C  Electronically Signed      Doylene Canning. Ladona Ridgel, MD  Electronically Signed   RB/MedQ  DD: 09/27/2006  DT: 09/28/2006  Job #: [id number]

## 2010-11-05 NOTE — Assessment & Plan Note (Signed)
[facility]                              CARDIOLOGY OFFICE NOTE   [name], [name]                       MRN:          [id number]  DATE:04/25/2006                            DOB:          23-Aug-1940    HISTORY OF PRESENT ILLNESS:  This is a pleasant 70 year old white female who  presented to the hospital with syncope after her hair was washed at her  hairdressers.  She got up to walk away and fell face forward without  warning.  In the ER she had a head CT and a spinal CT, both of which were  negative.  She then underwent carotid Dopplers which showed mild focal  calcific plaque on the right and mild mixed plaque on the left.  Echo showed  normal LV function and she was sent home for a stress Myoview and cardiac  event monitor.   Since she has been home she has not had anymore dizziness or syncope but she  has had episodes of palpitations, fast heart beats and skipping.  She says  sometimes when she is standing her legs get weak and she has to sit down but  it is not associated with her palpitations.  Dobutamine-Cardiolite showed no  significant scar or ischemia, inferior wall was difficult to evaluate due to  bowel artifact, LV function appears better than gated EF,  suggest echo for  further assessment.  EF by echo was 60-65%.  EF on gated was only 37%.   Her event recorder did not work for the first week and they had to send her  a new monitor.  The recordings show that she had an episode that appears to  be SVT at a rate of 270 beats per minute.  She has also had baseline  bradycardia and PVCs and PACs.  After reviewing these strips with Dr.  Gala Romney, he recommends adding low dose Toprol and having her see Dr.  Ladona Ridgel back within a week.   CURRENT MEDICATIONS:  1. Zocor 80 mg daily.  2. Norvasc 5 mg daily.  3. Triamterene/hydrochlorothiazide 37.5/25 daily.  4. Clorazepate 7.5 daily to t.i.d.  5. Aspirin 325 daily.  6. Ibuprofen  q.h.s.  7. Potassium 99 mg daily.   PHYSICAL EXAMINATION:  GENERAL:  This is a pleasant 70 year old white female  in no acute distress.  VITAL SIGNS:  Blood pressure 127/72, pulse 69, weight 194.  NECK:  Without JVD, HJR, bruit or thyroid enlargement.  LUNGS:  Clear anterior, posterior and lateral.  HEART:  Regular rate and rhythm at 70 beats per minute.  Normal S1 and S2.  No murmur, rub, bruit, thrill or heave noted.  ABDOMEN:  Soft without organomegaly, masses, lesions or abnormal tenderness.  EXTREMITIES:  Without cyanosis, clubbing, edema.  She has good distal  pulses.   IMPRESSION:  1. Syncope, rule out secondary to supraventricular tachycardia.  2. Supraventricular tachycardia with rapid ventricular rate.  3. Hypertension.  4. Hyperlipidemia.  5. Remote history of mitral valve prolapse.  6. History of fibromyalgia.  7. History of anxiety.   PLAN:  We have  scheduled her to see Dr. Ladona Ridgel back within the week.  I  cautioned her on driving and have given her a prescription for Toprol 25 mg  once a day.      Tanya Reedy, PA-C  Electronically Signed      Cecil Cranker, MD, [facility]  Electronically Signed   ML/MedQ  DD: 04/25/2006  DT: 04/25/2006  Job #: [id number]

## 2010-11-05 NOTE — Discharge Summary (Signed)
[name], STREAM                ACCOUNT NO.:  [id number]   MEDICAL RECORD NO.:  [id number]          PATIENT TYPE:  INP   LOCATION:  3730                         FACILITY:  [facility]   PHYSICIAN:  Tanya Canning. Ladona Ridgel, MD    DATE OF BIRTH:  July 08, 1940   DATE OF ADMISSION:  04/07/2006  DATE OF DISCHARGE:  04/09/2006                                 DISCHARGE SUMMARY   PRIMARY CARE PHYSICIAN:  [name].   CARDIOLOGIST:  The patient is new to [name].   REASON FOR ADMISSION:  Syncope.   DISCHARGE DIAGNOSES:  1. Syncope, etiology unclear.  2. Treated dyslipidemia.  3. Treated hypertension.  4. Obesity.  5. History of cardiac catheterization in 1996 without critical disease.  6. Good left ventricular function.   HISTORY OF PRESENT ILLNESS:  [name] is a 70 year old female patient  without history of coronary disease, who suffered a syncopal episode on the  date of admission.  She was at her hairdresser and had her hair washed.  She  got up to walk away and fell face forward without warning.  She was brought  to the emergency room for further evaluation.  She had a head CT and spinal  CT; they were both negative.  [name] was asked to see the patient to  further evaluate.   HOSPITAL COURSE:  The patient was admitted for further evaluation and  treatment.  She was monitored on telemetry.  This revealed sinus rhythm.  She had orthostatic vital signs done and these showed no postural changes.  The patient did undergo carotid Dopplers.  This showed mild focal calcific  plaque on the right at the ICA origin, left mild mixed plaque at the ICA  origin, no bilateral ICA stenosis, vertebral artery flow was antegrade.  An  echocardiogram was also performed.  This showed normal LV function.  The  patient was evaluated by Dr. Ladona [name] on the day of discharge.  He felt the  patient was stable enough for discharge home.  She can follow up as an  outpatient for stress Myoview  study as well as a cardiac event monitor.   LABS AND X-RAY DATA:  Echocardiogram done on April 07, 2006 revealing EF  of 60% to 65%, no regional wall motion abnormalities.  LV wall thickness was  at the upper limits of normal.  Mild mitral annular calcifications, small  pericardial effusion circumferential to the heart.   Left hip x-ray without acute abnormality.  Chest x-ray:  No acute  cardiopulmonary disease.  Head CT:  Negative noncontrast head CT.  Spinal  CT:  Degenerative spinal lytic changes.  Negative for fracture or  subluxation.   White count 6800, hemoglobin 13.6, hematocrit 41, platelet count 250,000.  INR of 0.23.  Sodium 139, potassium 3.6, chloride 103, CO2 of 24, glucose  99, BUN 12, creatinine 0.7, calcium 9.2, magnesium 2.2.  Cardiac markers  negative x4.  Total cholesterol 167, triglycerides 54, HDL of 43, LDL of  113.  TSH 2.257.   DISCHARGE MEDICATIONS:  1. Coated aspirin 81 mg daily.  2.  Zocor 80 mg q.h.s.  3. Clorazepate 15 mg half tab at q.h.s., as taken previously.  4. Norvasc 5 mg daily.  5. Dyazide 25/37.5 mg daily.  6. Potassium as taken previously.  7. Ibuprofen as taken previously.   DIET:  Low-fat, low-sodium diet.   ACTIVITY:  The patient is to increase her activity slowly.  She has been  advised to do no driving.  Wound care is not applicable.   FOLLOWUP APPOINTMENTS:  The patient will be set up for a followup stress  Myoview study this week.  She will also be set up for a followup with Dr.  Gala [name] this week.  Their office will contact her with that appointment.  She will be set up with an event monitor as an outpatient as well, and their  office will contact her about this.  She should follow up with [name] as  directed.   Total physician and PA time greater than 30 minutes on this discharge.     ______________________________  Tanya Newcomer, PA-C    ______________________________  Tanya Canning. Ladona Ridgel, MD    SW/MEDQ  D:   04/09/2006  T:  04/09/2006  Job:  [id number]   cc:   Tanya Cloud. Kriste Basque, MD

## 2010-11-05 NOTE — Letter (Signed)
September 28, 2006    C. Lesia Sago, M.D.  [address]  [address]  [place], [place] [address]   RE:  ESSANCE, GATTI  MRN:  [id number]  /  DOB:  [birth year]   Dear Mellody Dance:   Ms. Adriena Manfre is a 70 year old white female whom I follow for general  medical purposes.  She has had a history of recurrent syncope and dizzy  spells.  These have been quite distressing to her.  She has had an  extensive evaluation by our cardiology team and was recently  hospitalized after a spell.  I am including copies of their extensive  discharge summary which outlines the cardiac workup that has been done.  No cardiac source has been found, and they suggested that we send her to  you for a neurologic evaluation.   Ms. Grealish' medical problems include nonobstructive coronary artery  disease and hypertension.  She has hypercholesterolemia and moderate  obesity.  She has fibromyalgia and a history of back pain with surgery.  She has quite a bit of anxiety in her life, especially revolving around  her husband.  She has had previous cholecystectomy and hysterectomy.   CURRENT MEDICATIONS:  1. Xanax 0.5 mg one half to one tablet p.o. t.i.d.  2. Simvastatin 80 mg p.o. nightly.  3. Norvasc 5 mg p.o. daily.  4. K-20, one tablet p.o. b.i.d.  5. Lasix 20 mg p.o. every other day.  6. Ibuprofen p.r.n.  7. Meclizine 25 mg q. 4 hours p.r.n.   Thank you very much for your evaluation, and we look forward to hearing  your comments.    Sincerely,      Lonzo Cloud. Kriste Basque, MD  Electronically Signed    SMN/MedQ  DD: 09/28/2006  DT: 09/28/2006  Job #: [id number]

## 2010-11-05 NOTE — H&P (Signed)
[name], [name]                ACCOUNT NO.:  [id number]   MEDICAL RECORD NO.:  [id number]          PATIENT TYPE:  EMS   LOCATION:  MAJO                         FACILITY:  [facility]   PHYSICIAN:  Bevelyn Buckles. Bensimhon, MDDATE OF BIRTH:  1941-06-06   DATE OF ADMISSION:  04/07/2006  DATE OF DISCHARGE:                                HISTORY & PHYSICAL   PRIMARY CARE PHYSICIAN:  Dr. Alroy Dust.   PRIMARY CARDIOLOGIST:  Is new and will be Dr. Gala Romney.   CHIEF COMPLAINT:  Syncope.   HISTORY OF PRESENT ILLNESS:  [name] is a 70 year old female with no  previous history of coronary artery disease.  She had a catheterization  greater than 10 years ago which, according to old records, had no  significant disease but further details are not available.  Today, she was  at the hairdresser and had just had her hair washed.  She stood up to walk  to the styling chair and had a  syncopal episode falling face forward and  striking her face on the edge of the chair.  She sustained some minor  injuries.  She does not remember falling.  She had no prodrome or aura.  She  denies palpitations, chest pain, or dizziness.  When she woke up, she said  she saw stars.  She feels that she was out for only a few seconds.  She  could move all of her extremities and she had not been incontinent of bowel  or bladder.  She had left lower extremity pain, facial pain, and neck pain.  She had no chest pain at any point in time.  She does have a history of  palpitations but she had no palpitations today.  She gets occasional  tachypalpitations with elevated exertion such as vacuuming or raking.  The  last episode of palpitations was Wednesday when she was carrying some boxes.  The palpitations resolved when she stopped the exertion.   ALLERGIES:  CODEINE AND SULFA.   MEDICATIONS:  1. Clorazepate 15 mg 1/2 tablet nightly.  2. Norvasc 5 mg daily.  3. Dyazide 25/37.5 mg daily.  4. Zocor 80 mg daily.   PAST  MEDICAL HISTORY:  She has a history of hypertension as well as  hyperlipidemia.  She has no history of diabetes.  No history of tobacco use.  No premature history.  No family history of premature coronary artery  disease but she does have a history of obesity.  She has a history of  fibromyalgia and anxiety.   SURGICAL HISTORY:  She is status post cholecystectomy, hysterectomy, back  surgery and right carpal tunnel surgery.   SOCIAL HISTORY:  She is married and [place].  She is a housewife.  She has no history of alcohol, tobacco,  or drug abuse.   FAMILY HISTORY:  Her mother died in her 29s of multiple medical problems  including diabetes.  Her father died in his 61s of pneumonia after World War  II.  She no siblings with coronary artery disease.  She has no family  history of sudden cardiac death or syncope of which she is aware.   REVIEW OF SYSTEMS:  Her anxiety symptoms are well-controlled on the  clorazepate.  She has some chronic dyspnea on exertion.  She has lower  extremity edema which is fairly well controlled by the Dyazide.  The  palpitations are described above.  This is her only episode of syncope.  She  states that she snores badly but denies orthopnea or PND.  She denies  claudication symptoms, coughing, or wheezing.  She has chronic arthralgias  and back pain.  She has a lot of musculoskeletal stiffness right now.  She  denies hematemesis, hemoptysis, or melena.  She has some cold intolerance.  Review of systems is otherwise negative.   PHYSICAL EXAMINATION:  VITAL SIGNS:  Temperature is 97.1, blood pressure  153/80, pulse 70, respiratory rate 20, O2 saturation 99% on room air.  Her  heart rate on recheck is in the 50s and is sinus bradycardia.  GENERAL:  She is a well-developed obese white female in no acute distress.  HEENT:  Her head is normocephalic with some mild ecchymosis over her nose  and lower forehead.  Her  pupils are equal, round, and react to light and  accommodation.  Extraocular movements are intact.  Sclerae are clear.  Nares  without discharge.  NECK:  There is no lymphadenopathy, thyromegaly, bruit, or JVD noted, and  she has no carotid hypersensitivity noted.  CV:  Her heart is regular in  rate and rhythm with an S1 and S2 and no significant murmur, rub, or gallop  is noted.  Her distal pulses are 2+ in all four extremities and no femoral  bruits are appreciated.  LUNGS:  Clear to auscultation bilaterally.  SKIN:  No rashes or lesions are noted today.  ABDOMEN:  Soft and nontender with active bowel sounds and no  hepatosplenomegaly is noted.  EXTREMITIES:  There is no cyanosis, clubbing, or edema noted.  MUSCULOSKELETAL:  There is no joint deformity or effusions and no spine or  CVA tenderness.  NEUROLOGIC:  She is alert and oriented.  Cranial nerves II-XII are grossly  intact.   Chest x-ray shows cardiac enlargement with no acute disease.   Hip x-ray and pelvic x-ray shows no acute disease.   CT of the head shows no acute disease.  She has spondylosis in C5 and C6 but  no fracture, subluxation is noted.   EKG sinus rhythm rate 60 with a normal axis and nonspecific ST changes that  are not acute.  Her QTc is 446.  She has Q waves of questionable  significance in lead 3.  There is no old EKG available.   LABORATORY VALUES:  Her hemoglobin is 13.6, hematocrit 41, WBCs 6.8,  platelets 250.  Sodium 139, potassium 3.6, chloride 103, CO2 24, BUN 12,  creatinine 0.7, glucose 99.  Point of care marker is negative x1.   IMPRESSION:  Syncope:  It was abrupt and of unclear etiology.  It was  possibly secondary to bradycardia or vagal in origin.  It is doubtful that  this is ischemic.  She will be admitted and myocardial infarction will be  ruled out.  She will be monitored carefully on telemetry.  If her cardiac enzymes are negative, echocardiogram is within normal limits, EKG  remains  stable, no significant arrhythmia, and no further episode, it is possible  she could be discharged in the a.m. and follow up as an outpatient for  carotid Dopplers  and a stress Myoview.  She will also need a CardioNet  monitor as an outpatient.  If her cardiac enzymes are elevated or if she has  left ventricular dysfunction or wall motion abnormalities on her  echocardiogram, she will need a cardiac catheterization.  We will check a  TSH, a fasting lipid profile, and orthostatic vital signs as well.   Again, this is Theodore Demark, PA-C dictating for Dr. Nicholes Mango who saw  the patient and determined the plan of care prior to discharge.      Theodore Demark, PA-C      Bevelyn Buckles. Bensimhon, MD  Electronically Signed    RB/MEDQ  D:  04/07/2006  T:  04/09/2006  Job:  [id number]   cc:   Lonzo Cloud. Kriste Basque, MD

## 2010-11-05 NOTE — Discharge Summary (Signed)
[name], [name]                ACCOUNT NO.:  [id number]   MEDICAL RECORD NO.:  [id number]          PATIENT TYPE:  OBV   LOCATION:  2029                         FACILITY:  [facility]   PHYSICIAN:  Gerrit Friends. Dietrich Pates, MD, FACCDATE OF BIRTH:  04-20-1941   DATE OF ADMISSION:  09/25/2006  DATE OF DISCHARGE:  09/27/2006                               DISCHARGE SUMMARY   Greater than 35 minutes for this discharge.   PRIMARY CAREGIVER:  Lonzo Cloud. Kriste Basque, MD.   ALLERGIES:  She has allergies to SULFA and CODEINE.   DISCHARGE DIAGNOSES:  1. Unexplained recurrent syncope.      a.     Loop interrogation this admission, no episode recorded, no       auto-activated events.      b.     Negative tilt study April8,2008.      c.     Prodrome symptoms are flushing, headache, pressure and       nausea.  Her other syncopal episodes have had no prodrome.  2. Troponin I studies 0.01 then 0.01.   SECONDARY DIAGNOSES:  1. Syncope workup in the past.      a.     CardioNet study - no significant arrhythmia.      b.     Carotid ultrasound negative for internal carotid artery       stenosis.      c.     Echocardiogram October2007, normal ejection fraction,       diastolic dysfunction and no left ventricular wall motion       abnormalities.      d.     Loop implant November2007.  2. Hypertension.  3. Dyslipidemia.  4. Obesity.  5. Fibromyalgia.  6. Nonobstructive coronary artery disease by prior cath.  7. Anxiety.  8. Status post cholecystectomy, hysterectomy, back surgery, right      carpal tunnel release.   PROCEDURE:  April08,2008, negative tilt study.   BRIEF HISTORY:  [name] is a 70 year old female.  She is followed by  Dr. Lewayne Bunting for syncopal episodes.  She presents to [facility]  emergency room complaining of chest discomfort and shortness of breath.  She felt that she has been feeling fine over the last few days.  However, when she woke up in the morning, she fell little fatigue.   She  has had a history of syncope x4.   She was playing cards today, April07, 2008, when she felt a sudden  onset of flushing over her entire body.  She also complained of a  headache described as a pressure-type headache.  She complained of  nausea and diaphoresis as well.  Symptom began about 1:30 p.m.  Her  husband insisted she lie down because she was very weak.  She woke up  with her daughter calling her name.  Her daughter called 911.   Her other syncopal episodes have not been associated with any prodrome.  She does not feel that she passed out this time but feels that she fell  asleep.  She had no palpitations.  She woke up extremely  weak.  She  complained of nausea, shortness of breath, diaphoresis and also chest  pain 5/10.  The pain was located in the mid chest radiating through to  the back.  She has had chest pain before and her previous chest pain  episode 2007 was associated with an overnight admission.  Her cardiac  enzymes at that time were negative for myocardial infarction.  Her last  ischemic evaluation was a Myoview in November2007, reportedly  negative.  She has had an echocardiogram previously with preserved left  ventricular function. The patient will be admitted with a diagnosis of  chest pain.  Cardiac enzymes will be cycled.  Further evaluation depends  on results of cardiac enzymes.  At this point no catheterization is  planned.  [employer] has been contacted for a loop recorder  interrogation.  Previous interrogation shows only PVCs per office notes.  Orthostatic vital signs are scheduled and tilt table scheduled as well.  [name] is otherwise stable and will be continued on her home  medications.   HOSPITAL COURSE:  The patient presented with recurrent syncope, although  this had a prodrome and it was somewhat atypical from her prior episodes  of syncope x4.  Her cardiac enzymes were cycled and they were 0.01 then  0.01.  Orthostatic blood pressure  measurements have been taken and show  no evidence of orthostatic intolerance.  The patient had a tilt table  study April08, 2008, this was negative, although the patient did feel  sweaty and nauseated during the procedure and felt actually hard to  breathe.  However, there was no inducible syncope.  The patient is  discharging September 27, 2006, on her home medications.  They are as  follows.   DISCHARGE MEDICATIONS:  1. Enteric-coated aspirin 325 mg daily.  2. Norvasc 5 mg daily.  3. Zocor 80 mg daily at bedtime.  4. Lasix 20 mg daily.  5. K-Dur 40 mEq daily.  6. Clorazepate 7.5 mg at bedtime.   The patient will see Dr. Lewayne Bunting in follow-up, Tuesday May13,  2008, at 11:45.   LABORATORY STUDIES:  Troponin I studies was 0.01, then less than 0.01.  Complete blood count April07, 2008, hemoglobin 13.4, hematocrit 39.7,  white cells 8.7, platelets 267.  Serum electrolytes with sodium 138,  potassium 4.4, chloride 105, carbonate 31, BUN is 13, creatinine 0.83,  glucose is 95.  Protime 12.8, INR 1.  Liver function panel shows  alkaline phosphatase 84, SGOT is 33, SGPT is 32.  The TSH is 3.574,  hemoglobin A1c is 5.5.      Maple Mirza, PA      Gerrit Friends. Dietrich Pates, MD, [facility]  Electronically Signed   GM/MEDQ  D:  09/27/2006  T:  09/27/2006  Job:  [id number]   cc:   Lonzo Cloud. Kriste Basque, MD  Doylene Canning. Ladona Ridgel, MD

## 2010-11-05 NOTE — Discharge Summary (Signed)
NAMELATASHIA, [name]                ACCOUNT NO.:  [id number]   MEDICAL RECORD NO.:  [id number]          PATIENT TYPE:  INP   LOCATION:  3741                         FACILITY:  [facility]   PHYSICIAN:  Luis Abed, MD, FACCDATE OF BIRTH:  [birth year]   DATE OF ADMISSION:  06/14/2006  DATE OF DISCHARGE:  06/15/2006                               DISCHARGE SUMMARY   She has allergies to both CODEINE and SULFA.   PRINCIPAL DIAGNOSES:  1. Admitted June 14, 2006 with chest pain radiating to the jaw.      a.     Pain correlates with decreased blood pressure when blood       pressure gets better pain goes away.      b.     The patient called 9-1-1 after a day of dyspnea but the       deciding factor was an episode of  Flushing, nausea, presyncope.  c.  The patient had a loop recorder implanted November 2007 for syncope  of unclear delineation.  The patient activated the device several times  on June 14, 2006.  d.  Interrogation of loop recorder shows no tachy or brady events.  1. Troponin I studies negative this admission, less than 0.05 and      0.01.  2. D-dimer 0.29 negative finding for pulmonary embolism.      a.     B-type natriuretic peptide 57, no acute evidence either with       BNP or chest x-ray.  3. Exacerbation of congestive heart failure.  4. Potassium 3.2 on admission, replenished at admission to 3.6 from      June 15, 2006.   SECONDARY DIAGNOSES:  1. Hypertension.  2. Fibromyalgia.  3. Dyslipidemia.  4. Catheterization 1996 nonobstructive coronary artery disease.  5. Anxiety.  6. Ejection fraction 60-65% on echocardiogram October 2007.  No left      ventricular wall motion abnormalities, no mitral regurgitation.  No      procedures this admission.   BRIEF HISTORY:  [name] is a 70 year old female who had onset  about 7:20 p.m. June 14, 2006 of chest pain while watching TV.  She  had been short of breath all day, actually woke up short of  breath.  Her  substernal chest pain radiated to the jaw and was seen as a pressure on  the chest that lasted about 5 minutes.  She became diaphoretic and  nauseated.  She called 9-1-1.  When the [facility]  arrived, blood pressure was 73 systolic, chest pain returned, blood  pressure increased to 120 and chest pain had disappeared.  The patient  says she has felt very fatigued, diaphoretic, nauseated and blood  pressure lability.  In the emergency room, she reserved chest pain as a  5/10.  The plan is to admit the patient with possible acute coronary  syndrome.  Enzymes were cycled.  She was placed on IV heparin, IV  nitroglycerin and aspirin.   HOSPITAL COURSE:  The patient admitted to [facility] June 14, 2006 with chest pain which  waxed and waned, seem to correlate with  blood pressure.  Low blood pressure caused increase in chest pain,  higher blood pressure caused resolution of chest pain.  The patient was  placed on IV heparin and nitroglycerin.  She had recurrence of chest  pain in the overnight.  Her nitroglycerin was titrated upward with some  resolution to that.  Her D-dimer was negative for finding of pulmonary  embolism.  Troponin I studies were negative for myocardial ischemia  damage.  Her pain seems to be helped with both by anxiolytic and pain  medication.  The patient has also been started on proton pump inhibitor.  We will continue this at discharge.   She was seen hospital day #2 by Dr. Sherryl Manges.  He recommends she  follow up with Dr. Kriste Basque for an exercise Cardiolite study.  In addition,  she will go home on the following medications:  1. Zocor 80 mg daily at bedtime.  2. Norvasc 5 mg daily.  3. Enteric-coated aspirin 325 mg daily.  4. Lasix 20 mg daily.  5. Potassium chloride 10 mEq daily.  6. Tranxene 7.5 mg daily at bedtime.  7. Optional Prilosec OTC 20 mg tablets 2 tablets daily.  8. Percocet 5/325 1-2 tablets every 4-6 hours  as needed for chest      pain.  9. The patient will also get a prescription for nitroglycerin tablets      0.4 mg sublingually 1 tablet every 5 minutes x3 doses.   Once again loop recorder interrogation with the patient activating the  device showed no tachy or brady events.   LABORATORY STUDIES:  This admission complete blood count hemoglobin  11.7, hematocrit 34.9, white cells 5.3, platelets 176.  Serum  electrolytes after potassium supplementation sodium 140, potassium 3.6,  chloride 107, carbonate 20, BUN is 9, creatinine 0.9, glucose 108, pro  time 13.4, INR 1.0, troponin room I studies again less than 0.05 and  0.01, D-dimer 0.29.  BNP is 57, alkaline phosphatase is 70, SGOT is 24,  SGPT is 19.      Maple Mirza, [place]      Luis Abed, MD, [facility]  Electronically Signed    GM/MEDQ  D:  06/15/2006  T:  06/15/2006  Job:  [id number]   cc:   Doylene Canning. Ladona Ridgel, MD  Lonzo Cloud. Kriste Basque, MD

## 2010-12-10 ENCOUNTER — Encounter: Payer: Self-pay | Admitting: Pulmonary Disease

## 2010-12-13 ENCOUNTER — Ambulatory Visit: Payer: Self-pay | Admitting: Pulmonary Disease

## 2011-01-03 ENCOUNTER — Other Ambulatory Visit: Payer: Self-pay | Admitting: Pulmonary Disease

## 2011-01-06 ENCOUNTER — Other Ambulatory Visit: Payer: Self-pay | Admitting: Pulmonary Disease

## 2011-01-12 ENCOUNTER — Ambulatory Visit: Payer: Self-pay | Admitting: Pulmonary Disease

## 2011-02-02 ENCOUNTER — Ambulatory Visit (INDEPENDENT_AMBULATORY_CARE_PROVIDER_SITE_OTHER): Payer: Medicare Other | Admitting: Pulmonary Disease

## 2011-02-02 ENCOUNTER — Encounter: Payer: Self-pay | Admitting: Pulmonary Disease

## 2011-02-02 DIAGNOSIS — I2510 Atherosclerotic heart disease of native coronary artery without angina pectoris: Secondary | ICD-10-CM

## 2011-02-02 DIAGNOSIS — M545 Low back pain, unspecified: Secondary | ICD-10-CM

## 2011-02-02 DIAGNOSIS — E669 Obesity, unspecified: Secondary | ICD-10-CM

## 2011-02-02 DIAGNOSIS — M949 Disorder of cartilage, unspecified: Secondary | ICD-10-CM

## 2011-02-02 DIAGNOSIS — K7689 Other specified diseases of liver: Secondary | ICD-10-CM

## 2011-02-02 DIAGNOSIS — R55 Syncope and collapse: Secondary | ICD-10-CM

## 2011-02-02 DIAGNOSIS — F411 Generalized anxiety disorder: Secondary | ICD-10-CM

## 2011-02-02 DIAGNOSIS — I10 Essential (primary) hypertension: Secondary | ICD-10-CM

## 2011-02-02 DIAGNOSIS — E785 Hyperlipidemia, unspecified: Secondary | ICD-10-CM

## 2011-02-02 DIAGNOSIS — IMO0001 Reserved for inherently not codable concepts without codable children: Secondary | ICD-10-CM

## 2011-02-02 DIAGNOSIS — K5730 Diverticulosis of large intestine without perforation or abscess without bleeding: Secondary | ICD-10-CM

## 2011-02-02 DIAGNOSIS — M899 Disorder of bone, unspecified: Secondary | ICD-10-CM

## 2011-02-02 MED ORDER — TEMAZEPAM 30 MG PO CAPS: 30.0000 mg | ORAL_CAPSULE | Freq: Every evening | ORAL | Status: AC | PRN

## 2011-02-02 MED ORDER — EZETIMIBE 10 MG PO TABS: 10.0000 mg | ORAL_TABLET | Freq: Every day | ORAL | Status: DC

## 2011-02-02 NOTE — Progress Notes (Signed)
Subjective:    Patient ID: [name], female    DOB: 12/01/1940, 70 y.o.   MRN: [id number]  HPI 70 y/o WF here for a follow up visit... she has mult med problems as noted below... Followed for general medical purposes w/ hx HBP, CAD & hx Syncope followed by DrTaylor, Hyperlipidemia, Obesity, Fatty Liver disease, LBP/ FM/ Osteopenia, & anxiety...  ~  December 14, 2009:  she reports a good 6 months- no new complaints or concerns... BP controlled on Lasix & she denies CP, palpit, SOB, edema, etc... she notes dizziness if she tilts her head back "it's cardio-depressor dizziness" she says- no syncope... she decided to re-start her Simva80 & never went to the [facility]id Clinic> FLP improved on this but LFT's elevated (FLD) & she will need assessment in the clinic + f/u w/ GI.Marland Kitchen.  ~  June 15, 2010:  65mo ROV- she seems conflicted> told the nurse she has no energy & tired all the time; tells me she feels good "best in 48yrs"... she stopped her Simva80 on her own & went to [facility] 7/11 but didn't return 8/11 as requested> FLP today shows LDL=212 & she is encouraged to ret to the clinic for help... BP stable & she denies CP, palpit, edema, etc;  requesting diff nasal spray & we will try Omnaris.  ~  February 02, 2011:  68mo ROV & she continues to do well, feeling good she says w/o new complaints or concerns x heel pain (we discussed warm soaks & stretching exercises) & insomnia (taking 2 Xanax Qhs w/o benefit)- we discussed trial RESTORIL 30mg  Qhs...     She notes BP well controlled; denies CP, palpit, SOB, edema, or cerebral ischemic symptoms; and no further dizziness from her "carotid sinus sensitivity" per DrTaylor;  She did the the Pharmacists in the Lipid Clinic & was tried on Crestor 5mg  but INTOL she says & "they released me back to your care"> we discussed trial of non-statin rx ZETIA 10mg /d along w/ better diet, exercise & wt reduction...   Current Problems:   ALLERGIC RHINITIS - she states that saline nasal  spray dries her eyes out & wants diff inhaler> rec OMNARIS 2sp Qhs...  HYPERTENSION (ICD-401.9) - off Norvasc & Diltiazem, on LASIX 20mg /d, K34mEqBid...BP=136/92 ==> 130/80 after rest today and she denies HA, fatigue, visual changes, CP, dyspnea, edema, etc... she notes occas palpitations...   CORONARY ARTERY DISEASE (ICD-414.00) - hx non-obstructive CAD followed by DrTaylor Antony Haste... she denies CP, palpit, ch in SOB, edema, etc... ~  cath 1996 showed 20-30% lesions in the Circ & RCA, EF=60%... ~  NuclearStressTest 10/07 showed no scar or ischemia, ?EF=37%?... ~  2DEcho 10/07 showed no regional wall motion changes, EF=60-65%... valves OK, some DD noted. ~  repeat cath 12/09 showed similar findings w/ nonobstructive plaque, EF= 65%...  Hx of SYNCOPE (ICD-780.2) - hosp in 2007 for syncope and no etiology discovered... implanted loop recorder placed for 18 months w/o events, then 2 spells after the recorder was removed!!!... rehosp 4/08 w/ neg studies at that time too... repeat eval 11/09 & nothing found... Dx w/ Carotid Sinus Hypersensitivity by DrTaylor... ~  CDopplers 10/07 showed mild plaque, 0-39% bilat ICA... ~  neg TiltTable study 4/08... ~  6/10: notes occas palpit, dizzy- uses Meclizine Prn... saw DrTaylor 7/10- rec diet + exerc. ~  No further syncope or near-syncope by her report...  HYPERLIPIDEMIA (ICD-272.4) - on SIMVASTATIN 80mg /d- she takes it on & off> asked to stop Simva80 & f/u  w/ Lipid Clinic... we discussed diet + exercise as well... ~  FLP 3/09 showed TChol 139, TG 74, HDL 41, LDL 83... continue med + diet therapy...  ~  FLP 11/09 showed TChol 99, TG 74, HDL 30, LDL 54 ~  FLP 6/10 on Simva80 showed TChol 141, TG 90, HDL 34, LDL 89... note: LFT's sl elevated... ~  FLP 12/10 off Simva showed TChol 279, TG 178, HDL 33, LDL 208... rec> refer to Lipid Clinic (she never went). ~  FLP 6/11 back on Simva80 showed TChol 154, TG 107, HDL 37, LDL 96... needs LC eval to ch med. ~  FLP 12/11  off meds showed TChol 270, TG 145, HDL 39, LDL 213... rec to return to [facility] for on-going care. ~  Lipid Clinic tried Crestor 5mg  but she states INTOL & "they released me" according to the pt... ~  8/12:  Try Zetia 10mg /d along w/ better diet, exercise, wt reduction...  OBESITY (ICD-278.00) - We discussed diet + exercise program... she states that DrOz says you can gain weight while hardly eating any food & she thinks she isn't eating enough food...  ~  weight 12/09 = 212# ~  weight 6/10 = 219# ~  weight 12/10 = 217# ~  weight 6/11 = 215# ~  weight 12/11 = 216#.Marland Kitchen we discussed diet + exercise. ~  Weight 8/12 = 213#  DIVERTICULOSIS OF COLON (ICD-562.10) - she needs f/u eval DrDBrodie- ?when was last colonoscopy? ~  CT Abd 9/10  done for Abd pain- showed divertics & hep steatosis, retained stool & DJD sp...  FATTY LIVER DISEASE (ICD-571.8) - hx elevated LFT's x yrs and eval DrDBrodie in past w/ rec to diet/ exerc/ weight reduction... ~  labs 6/10 showed SGOT= 89, SGPT= 65 ~  labs 12/10 showed SGOT= 65, SGPT= 54 ~  labs 6/11 showed SGOT= 119, SGPT= 76... needs re-eval by GI ~  labs 12/11 showed SGOT= 53, SGPT= 44  LOW BACK PAIN, CHRONIC (ICD-724.2) - s/p lumbar surg in 1989 by DrBotero...  FIBROMYALGIA (ICD-729.1)  OSTEOPENIA (ICD-733.90) - ?prev BMD from GYN... on Calcium, MVI, VitD... ~  BMD here 3/09 showed TScores -0.7 in Spine, and -1.6 in left FemNeck ~  6/11:  f/u BMD showed TScores -0.7 in Spine, and -1.1 in right FemNeck  DIZZINESS (ICD-780.4) - she takes MECLIZINE 25mg  Bid... ~  s/p ENT eval by Gayla Medicus 2/08 felt to be benign positional vertigo, Rx w/ meclizine...  ~  she cancelled a planned neuro evaluation 4/08...  ANXIETY (ICD-300.00) - on XANAX 0.5mg Tid but her CC is insomnia so we will give her a trial of RESTORIL 30mg  Qhs...  SKIN CANCER - s/o Moh's surg for BCCa left cheek...  HEALTH MAINT--- GYN= prev DrFore and needs new Gyn now... [facility]... had neg  FlexSig 1991 DrBrodie and needs routine colonoscopy scheduled... states she uses olive oil on her dry skin per DrOz... had Pneumovax 2008, & 2010 Flu shot in Oct.   Current Medications, Allergies, Past Medical History, Past Surgical History, Family History, and Social History were reviewed in [employer] record.   Past Surgical History  Procedure Date  . Tonsillectomy and adenoidectomy   . Total abdominal hysterectomy w/ bilateral salpingoophorectomy   . Cholecystectomy   . Back surgery   . Mohs surgery     Outpatient Encounter Prescriptions as of 02/02/2011  Medication Sig Dispense Refill  . ALPRAZolam (XANAX) 0.5 MG tablet TAKE ONE-HALF TO ONE TABLET BY MOUTH THREE  TIMES DAILY AS NEEDED FOR  NERVES.(MAX OF 3 TABLETS PER DAY)  100 tablet  5  . aspirin 325 MG tablet Take 325 mg by mouth daily.        . Calcium Carbonate-Vitamin D (CALTRATE 600+D) 600-400 MG-UNIT per tablet Take 1 tablet by mouth daily.        . fish oil-omega-3 fatty acids 1000 MG capsule Take 2 g by mouth daily.        . furosemide (LASIX) 20 MG tablet Take 20 mg by mouth daily.        . meclizine (ANTIVERT) 25 MG tablet Take 1/2 up to four times a day for dizziness       . Multiple Vitamin (MULTIVITAMIN) capsule Take 1 capsule by mouth daily.        . potassium chloride (KLOR-CON) 20 MEQ packet Take 20 mEq by mouth 2 (two) times daily.        Marland Kitchen DISCONTD: predniSONE, Pak, (STERAPRED) 5 MG TABS 6 day pack take as directed  21 tablet  0  . DISCONTD: rosuvastatin (CRESTOR) 10 MG tablet Take 10 mg by mouth daily.          Allergies  Allergen Reactions  . Codeine     REACTION: nausea  . Diltiazem Hcl     REACTION: pt states it made her dizzy...  . Sulfonamide Derivatives     REACTION: red/splotchy hives    Review of Systems       See HPI - all other systems neg except as noted...       The patient complains of dyspnea on exertion.  The patient denies anorexia, fever, weight loss, weight  gain, vision loss, decreased hearing, hoarseness, chest pain, syncope, peripheral edema, prolonged cough, headaches, hemoptysis, abdominal pain, melena, hematochezia, severe indigestion/heartburn, hematuria, incontinence, muscle weakness, suspicious skin lesions, transient blindness, difficulty walking, depression, unusual weight change, abnormal bleeding, enlarged lymph nodes, and angioedema.     Objective:   Physical Exam     WD, Obese, 70 y/o WF in NAD... GENERAL:  Alert & oriented; pleasant & cooperative... HEENT:  [place]/AT, EOM-wnl,  PERRLA, EACs- wax, TMs-wnl, NOSE-pale, THROAT-clear & wnl. NECK:  Supple w/ fairROM; no JVD; normal carotid impulses w/o bruits; no thyromegaly or nodules palpated; no lymphadenopathy. CHEST:  Clear BS bilaterally without wheezing, rales, or signs of consolidation... HEART:  Regular Rhythm; without murmurs/ rubs/ or gallops heard... ABDOMEN:  Soft & nontender; normal bowel sounds; no organomegaly or masses detected. EXT: without deformities, mild arthritic changes; no varicose veins/ +venous insuffic/ tr edema... NEURO:  CN's intact, no focal neuro deficits... DERM:  scar on left cheek, no lesions noted...   Assessment & Plan:   HBP>  Controlled on LASIX + diet w/ weight reduction stressed to pt...  CAD>  Followed by DrTaylor for Cards on ASA alone; denies angina etc but she is too sedentary & needs incr exercise etc...  Hx Syncope>  Extensive eval by Cards, DrTaylor- dx w/ carotid sinus hypersensitivity; no recurrent episodes she says; she uses Meclizine prn dizziness...  HYPERLIPID>  A signif problem for her but INTOL to all statins and the Lipid Clinic has "released her" she says; we discussed trial of non-statin rx to check for efficacy: try ZETIA 10mg /d.  OBESITY>  We reviewed the needed diet, exercise & wt reduction programs...  GI> Divertics>  She needs f/u DrDBrodie & consideration of f/u colonoscopy...  Fatty Liver Disease>  Hx elev LFTs and CT  Abd 9/10 w/ hepatic  steatosis; we discussed the need for weight reduction...  ORTHO> LBP, FM, Osteopenia>  Prev lumbar surg from DrBotero, she uses OTC analgesics, calcium, MVI...  Anxiety>  On Xanax as needed & we will give her a trial of RESTORIL 30mg  Qhs for insomnia.Marland KitchenMarland Kitchen

## 2011-02-02 NOTE — Patient Instructions (Signed)
Today we updated your med list in EPIC...  We wrote a new prescription for ZETIA to try one daily for your cholesterol...  We wrote a new prescription for RESTORIL to try at bedtime for sleep...  For your HEEL pain:    Try hot soaks as needed...    Do the stretching exercise several times daily as demonstrated...    You may use Advil, Aleve, Tylenol in  Addition.  Once you have been on the Zetia for several months, plus your excellent low fat diet, call us for a follow up FLP in our lab...  Let's plan another follow up visit in  6 months.Marland KitchenMarland Kitchen

## 2011-02-04 ENCOUNTER — Encounter: Payer: Self-pay | Admitting: Pulmonary Disease

## 2011-02-07 ENCOUNTER — Encounter: Payer: Self-pay | Admitting: Pulmonary Disease

## 2011-02-07 ENCOUNTER — Other Ambulatory Visit: Payer: Self-pay | Admitting: Pulmonary Disease

## 2011-02-07 DIAGNOSIS — K5730 Diverticulosis of large intestine without perforation or abscess without bleeding: Secondary | ICD-10-CM

## 2011-03-25 LAB — CARDIAC PANEL(CRET KIN+CKTOT+MB+TROPI)
CK, MB: 1.3 ng/mL (ref 0.3–4.0)
Relative Index: INVALID (ref 0.0–2.5)
Relative Index: INVALID (ref 0.0–2.5)
Troponin I: 0.01 ng/mL (ref 0.00–0.06)
Troponin I: <0.01 ng/mL (ref 0.00–0.06)

## 2011-03-25 LAB — COMPREHENSIVE METABOLIC PANEL
ALT: 48 U/L — ABNORMAL HIGH (ref 0–35)
AST: 63 U/L — ABNORMAL HIGH (ref 0–37)
Alkaline Phosphatase: 115 U/L (ref 39–117)
CO2: 27 mEq/L (ref 19–32)
Calcium: 9.1 mg/dL (ref 8.4–10.5)
Chloride: 108 mEq/L (ref 96–112)
GFR calc Af Amer: >60 mL/min (ref >60)
GFR calc non Af Amer: >60 mL/min (ref >60)
Glucose, Bld: 103 mg/dL — ABNORMAL HIGH (ref 70–99)
Potassium: 4.3 mEq/L (ref 3.5–5.1)
Sodium: 142 mEq/L (ref 135–145)
Total Bilirubin: 0.9 mg/dL (ref 0.3–1.2)

## 2011-03-25 LAB — LIPID PANEL
Cholesterol: 119 mg/dL (ref 0–200)
HDL: 29 mg/dL — ABNORMAL LOW (ref >39)

## 2011-03-25 LAB — CBC
MCHC: 33.3 g/dL (ref 30.0–36.0)
MCV: 88.1 fL (ref 78.0–100.0)
Platelets: 190 10*3/uL (ref 150–400)
RBC: 4.71 MIL/uL (ref 3.87–5.11)

## 2011-03-25 LAB — MAGNESIUM: Magnesium: 2.3 mg/dL (ref 1.5–2.5)

## 2011-07-05 ENCOUNTER — Encounter: Payer: Self-pay | Admitting: Pulmonary Disease

## 2011-07-05 ENCOUNTER — Other Ambulatory Visit (INDEPENDENT_AMBULATORY_CARE_PROVIDER_SITE_OTHER): Payer: Medicare Other

## 2011-07-05 ENCOUNTER — Other Ambulatory Visit: Payer: Self-pay | Admitting: Pulmonary Disease

## 2011-07-05 ENCOUNTER — Ambulatory Visit (INDEPENDENT_AMBULATORY_CARE_PROVIDER_SITE_OTHER): Payer: Medicare Other | Admitting: Pulmonary Disease

## 2011-07-05 DIAGNOSIS — K7689 Other specified diseases of liver: Secondary | ICD-10-CM

## 2011-07-05 DIAGNOSIS — K5730 Diverticulosis of large intestine without perforation or abscess without bleeding: Secondary | ICD-10-CM

## 2011-07-05 DIAGNOSIS — F411 Generalized anxiety disorder: Secondary | ICD-10-CM

## 2011-07-05 DIAGNOSIS — I10 Essential (primary) hypertension: Secondary | ICD-10-CM

## 2011-07-05 DIAGNOSIS — R55 Syncope and collapse: Secondary | ICD-10-CM

## 2011-07-05 DIAGNOSIS — M949 Disorder of cartilage, unspecified: Secondary | ICD-10-CM

## 2011-07-05 DIAGNOSIS — E785 Hyperlipidemia, unspecified: Secondary | ICD-10-CM

## 2011-07-05 DIAGNOSIS — M545 Low back pain, unspecified: Secondary | ICD-10-CM

## 2011-07-05 DIAGNOSIS — I2510 Atherosclerotic heart disease of native coronary artery without angina pectoris: Secondary | ICD-10-CM

## 2011-07-05 DIAGNOSIS — M899 Disorder of bone, unspecified: Secondary | ICD-10-CM

## 2011-07-05 DIAGNOSIS — E669 Obesity, unspecified: Secondary | ICD-10-CM

## 2011-07-05 LAB — CBC WITH DIFFERENTIAL/PLATELET
Eosinophils Absolute: 0.1 10*3/uL (ref 0.0–0.7)
MCHC: 34.7 g/dL (ref 30.0–36.0)
MCV: 105.8 fl — ABNORMAL HIGH (ref 78.0–100.0)
Monocytes Absolute: 0.2 10*3/uL (ref 0.1–1.0)
Neutrophils Relative %: 57.8 % (ref 43.0–77.0)
Platelets: 143 10*3/uL — ABNORMAL LOW (ref 150.0–400.0)
WBC: 3.6 10*3/uL — ABNORMAL LOW (ref 4.5–10.5)

## 2011-07-05 LAB — HEPATIC FUNCTION PANEL
ALT: 41 U/L — ABNORMAL HIGH (ref 0–35)
AST: 53 U/L — ABNORMAL HIGH (ref 0–37)
Bilirubin, Direct: 0.2 mg/dL (ref 0.0–0.3)
Total Bilirubin: 1 mg/dL (ref 0.3–1.2)
Total Protein: 7.3 g/dL (ref 6.0–8.3)

## 2011-07-05 LAB — LIPID PANEL: VLDL: 29.4 mg/dL (ref 0.0–40.0)

## 2011-07-05 LAB — TSH: TSH: 2.79 u[IU]/mL (ref 0.35–5.50)

## 2011-07-05 LAB — BASIC METABOLIC PANEL
BUN: 14 mg/dL (ref 6–23)
CO2: 28 mEq/L (ref 19–32)
Chloride: 104 mEq/L (ref 96–112)
Potassium: 3.8 mEq/L (ref 3.5–5.1)

## 2011-07-05 MED ORDER — FIRST-DUKES MOUTHWASH MT SUSP: OROMUCOSAL | Status: DC

## 2011-07-05 NOTE — Progress Notes (Signed)
Subjective:    Patient ID: [name], female    DOB: [birth year], 71 y.o.   MRN: [id number]  HPI 71 y/o WF here for a follow up visit... she has mult med problems as noted below... Followed for general medical purposes w/ hx HBP, CAD & hx Syncope followed by DrTaylor, Hyperlipidemia, Obesity, Fatty Liver disease, LBP/ FM/ Osteopenia, & anxiety...  ~  December 14, 2009:  she reports a good 6 months- no new complaints or concerns... BP controlled on Lasix & she denies CP, palpit, SOB, edema, etc... she notes dizziness if she tilts her head back "it's cardio-depressor dizziness" she says- no syncope... she decided to re-start her Simva80 & never went to the [facility] Clinic> FLP improved on this but LFT's elevated (FLD) & she will need assessment in the clinic + f/u w/ GI.Marland Kitchen.  ~  June 15, 2010:  89mo ROV- she seems conflicted> told the nurse she has no energy & tired all the time; tells me she feels good "best in 69yrs"... she stopped her Simva80 on her own & went to [facility] 7/11 but didn't return 8/11 as requested> FLP today shows LDL=212 & she is encouraged to ret to the clinic for help... BP stable & she denies CP, palpit, edema, etc;  requesting diff nasal spray & we will try Omnaris.  ~  February 02, 2011:  88mo ROV & she continues to do well, feeling good she says w/o new complaints or concerns x heel pain (we discussed warm soaks & stretching exercises) & insomnia (taking 2 Xanax Qhs w/o benefit)- we discussed trial RESTORIL 30mg  Qhs...     She notes BP well controlled; denies CP, palpit, SOB, edema, or cerebral ischemic symptoms; and no further dizziness from her "carotid sinus sensitivity" per DrTaylor;  She did the the Pharmacists in the Lipid Clinic & was tried on Crestor 5mg  but INTOL she says & "they released me back to your care"> we discussed trial of non-statin rx ZETIA 10mg /d along w/ better diet, exercise & wt reduction...  ~  July 05, 2011:  58mo ROV & she is c/o dry cough, & a sl sore throat  x1d for which she would like a prescription for MMW; also notes several skin cancers removed by Derm & she has f/u appt soon...    >HBP controlled w/ Lasix20 + K20Bid; BP= 146/92 here now but better at home she says& she is reminded to elim sodium, get on diet & get wt down...     >Cards followed by DrTaylor for nonobstructive CAD, DD, Hx syncope & carotid sinus hypersensitivity per DrTaylor; on ASA325mg /d...    >She has severe hyperlipidemia & has been INTOL to all meds; last tried Zetia but she stopped this too; she notes that the Lipid clinic gave up on her and "they released me" so now she is on diet alone + fishOil "I'll do the best I can", and she notes "It's hereditary per DrTaylor"; see FLP resuts below>>    >Obesity is a constant battle; weight= 210#, 63" tall, BMI=37-38; we reviewed low carb low fat weight reducing diet & exercise program...    >Ortho> FM, LBP, Osteopenia, etc...    >Anxiety on Xanax prn...          Problem List:    ALLERGIC RHINITIS - she states that saline nasal spray dries her eyes out & wants diff inhaler> rec OMNARIS 2sp Qhs...  HYPERTENSION (ICD-401.9) - off Norvasc & Diltiazem, on LASIX 20mg /d, K17mEqBid... ~  8/12:  BP=136/92 =>130/80 after rest today and she denies HA, fatigue, visual changes, CP, dyspnea, edema, etc; she notes occas palpitations...  ~  1/13: BP= 146/92 but she notes better at home & she is reminded to elim sodium, get on diet & get weight down...  CORONARY ARTERY DISEASE (ICD-414.00) - hx non-obstructive CAD followed by DrTaylor Antony Haste... she denies CP, palpit, ch in SOB, edema, etc... ~  cath 1996 showed 20-30% lesions in the Circ & RCA, EF=60%... ~  NuclearStressTest 10/07 showed no scar or ischemia, ?EF=37%?... ~  2DEcho 10/07 showed no regional wall motion changes, EF=60-65%... valves OK, some DD noted. ~  repeat cath 12/09 showed similar findings w/ nonobstructive plaque, EF= 65%...  Hx of SYNCOPE (ICD-780.2) - hosp in 2007 for syncope  and no etiology discovered... implanted loop recorder placed for 18 months w/o events, then 2 spells after the recorder was removed!!!... rehosp 4/08 w/ neg studies at that time too... repeat eval 11/09 & nothing found... Dx w/ Carotid Sinus Hypersensitivity by DrTaylor... ~  CDopplers 10/07 showed mild plaque, 0-39% bilat ICA... ~  neg TiltTable study 4/08... ~  6/10: notes occas palpit, dizzy- uses Meclizine Prn... saw DrTaylor 7/10- rec diet + exerc. ~  No further syncope or near-syncope by her report...  HYPERLIPIDEMIA (ICD-272.4) - she has proven INTOL to all meds & was "released" from the [facility]inic> we discussed diet + exercise again... ~  FLP 3/09 showed TChol 139, TG 74, HDL 41, LDL 83... continue med + diet therapy...  ~  FLP 11/09 showed TChol 99, TG 74, HDL 30, LDL 54 ~  FLP 6/10 on Simva80 showed TChol 141, TG 90, HDL 34, LDL 89... note: LFT's sl elevated... ~  FLP 12/10 off Simva showed TChol 279, TG 178, HDL 33, LDL 208... rec> refer to Lipid Clinic (she never went). ~  FLP 6/11 back on Simva80 showed TChol 154, TG 107, HDL 37, LDL 96... needs LC eval to ch med. ~  FLP 12/11 off meds showed TChol 270, TG 145, HDL 39, LDL 213... rec to return to [facility] for on-going care. ~  Lipid Clinic tried Crestor 5mg  but she states INTOL & "they released me" according to the pt... ~  8/12:  Try Zetia 10mg /d along w/ better diet, exercise, wt reduction => she states INTOL to Zetia as well... ~  1/13: FLP on diet alone showed TChol 234, TG 147, HDL 37, LDL 163... She will continue to work on diet & exercise...  OBESITY (ICD-278.00) - We discussed diet + exercise program... she states that DrOz says you can gain weight while hardly eating any food & she thinks she isn't eating enough food...  ~  weight 12/09 = 212# ~  weight 6/10 = 219# ~  weight 12/10 = 217# ~  weight 6/11 = 215# ~  weight 12/11 = 216#.Marland Kitchen we discussed diet + exercise. ~  Weight 8/12 = 213# ~  Weight 1/13 = 210#  DIVERTICULOSIS  OF COLON (ICD-562.10) - she needs f/u eval DrDBrodie- ?when was last colonoscopy? ~  CT Abd 9/10  done for Abd pain- showed divertics & hep steatosis, retained stool & DJD sp...  FATTY LIVER DISEASE (ICD-571.8) - hx elevated LFT's x yrs and eval DrDBrodie in past w/ rec to diet/ exerc/ weight reduction... ~  labs 6/10 showed SGOT= 89, SGPT= 65 ~  labs 12/10 showed SGOT= 65, SGPT= 54 ~  labs 6/11 showed SGOT= 119, SGPT= 76... needs re-eval by GI ~  labs 12/11  showed SGOT= 53, SGPT= 44 ~  Labs 1/13 showed SGOT= 53, SGPT= 41  LOW BACK PAIN, CHRONIC (ICD-724.2) - s/p lumbar surg in 1989 by DrBotero...  FIBROMYALGIA (ICD-729.1)  OSTEOPENIA (ICD-733.90) - ?prev BMD from GYN... on Calcium, MVI, VitD... ~  BMD here 3/09 showed TScores -0.7 in Spine, and -1.6 in left FemNeck ~  6/11:  f/u BMD showed TScores -0.7 in Spine, and -1.1 in right FemNeck  DIZZINESS (ICD-780.4) - she takes MECLIZINE 25mg  Bid... ~  s/p ENT eval by Gayla Medicus 2/08 felt to be benign positional vertigo, Rx w/ meclizine...  ~  she cancelled a planned neuro evaluation 4/08...  ANXIETY (ICD-300.00) - on XANAX 0.5mg Tid prn... ~  8/12:  She is c/o insomnia & the Xanax doesn't help she said; rec trial Restoril 30mg Qhs prn...  SKIN CANCER - s/o Moh's surg for BCCa left cheek...  HEALTH MAINT--- GYN= prev DrFore and needs new Gyn now... mamm[facility]... had neg FlexSig 1991 DrBrodie and needs routine colonoscopy scheduled... states she uses olive oil on her dry skin per DrOz... had Pneumovax 2008, & 2010 Flu shot in Oct.   Current Medications, Allergies, Past Medical History, Past Surgical History, Family History, and Social History were reviewed in [employer] record.   Past Surgical History  Procedure Date  . Tonsillectomy and adenoidectomy   . Total abdominal hysterectomy w/ bilateral salpingoophorectomy   . Cholecystectomy   . Back surgery   . Mohs surgery     Outpatient Encounter  Prescriptions as of 07/05/2011  Medication Sig Dispense Refill  . ALPRAZolam (XANAX) 0.5 MG tablet TAKE ONE-HALF TO ONE TABLET BY MOUTH THREE TIMES DAILY AS NEEDED FOR  NERVES.(MAX OF 3 TABLETS PER DAY)  100 tablet  5  . aspirin 325 MG tablet Take 325 mg by mouth daily.        . Calcium Carbonate-Vitamin D (CALTRATE 600+D) 600-400 MG-UNIT per tablet Take 1 tablet by mouth daily.        . furosemide (LASIX) 20 MG tablet Take 20 mg by mouth daily.        . Multiple Vitamin (MULTIVITAMIN) capsule Take 1 capsule by mouth daily.        . potassium chloride (KLOR-CON) 20 MEQ packet Take 20 mEq by mouth 2 (two) times daily.        Marland Kitchen ezetimibe (ZETIA) 10 MG tablet Take 1 tablet (10 mg total) by mouth daily.  30 tablet  11  . fish oil-omega-3 fatty acids 1000 MG capsule Take 2 g by mouth daily.        . fluticasone (CUTIVATE) 0.05 % cream as needed.      . meclizine (ANTIVERT) 25 MG tablet Take 1/2 up to four times a day for dizziness       . DISCONTD: potassium chloride SA (K-DUR,KLOR-CON) 20 MEQ tablet         Allergies  Allergen Reactions  . Codeine     REACTION: nausea  . Diltiazem Hcl     REACTION: pt states it made her dizzy...  . Sulfonamide Derivatives     REACTION: red/splotchy hives    Current Medications, Allergies, Past Medical History, Past Surgical History, Family History, and Social History were reviewed in [employer] record.    Review of Systems       See HPI - all other systems neg except as noted...       The patient complains of dyspnea on exertion.  The patient denies  anorexia, fever, weight loss, weight gain, vision loss, decreased hearing, hoarseness, chest pain, syncope, peripheral edema, prolonged cough, headaches, hemoptysis, abdominal pain, melena, hematochezia, severe indigestion/heartburn, hematuria, incontinence, muscle weakness, suspicious skin lesions, transient blindness, difficulty walking, depression, unusual weight change, abnormal  bleeding, enlarged lymph nodes, and angioedema.     Objective:   Physical Exam     WD, Obese, 71 y/o WF in NAD... GENERAL:  Alert & oriented; pleasant & cooperative... HEENT:  [place]/AT, EOM-wnl,  PERRLA, EACs- wax, TMs-wnl, NOSE-pale, THROAT-clear & wnl. NECK:  Supple w/ fairROM; no JVD; normal carotid impulses w/o bruits; no thyromegaly or nodules palpated; no lymphadenopathy. CHEST:  Clear BS bilaterally without wheezing, rales, or signs of consolidation... HEART:  Regular Rhythm; without murmurs/ rubs/ or gallops heard... ABDOMEN:  Soft & nontender; normal bowel sounds; no organomegaly or masses detected. EXT: without deformities, mild arthritic changes; no varicose veins/ +venous insuffic/ tr edema... NEURO:  CN's intact, no focal neuro deficits... DERM:  scar on left cheek, no lesions noted...  RADIOLOGY DATA:  Reviewed in the EPIC EMR & discussed w/ the patient...  LABORATORY DATA:  Reviewed in the EPIC EMR & discussed w/ the patient...   Assessment & Plan:   HBP>  Control is fair on LASIX;  diet w/ weight reduction stressed to pt or we may need to add meds.  CAD>  Followed by DrTaylor for Cards on ASA alone; denies angina etc but she is too sedentary & needs incr exercise etc...  Hx Syncope>  Extensive eval by Cards, DrTaylor- dx w/ carotid sinus hypersensitivity; no recurrent episodes she says; she uses Meclizine prn dizziness...  HYPERLIPID>  A signif problem for her but INTOL to all statins and the Lipid Clinic has "released her" she says; she was also intol to Zetia & she has decided to forgo med rx & concentrate on diet alone...  OBESITY>  We reviewed the needed diet, exercise & wt reduction programs...  GI> Divertics>  She needs f/u DrDBrodie & consideration of f/u colonoscopy...  Fatty Liver Disease>  Hx elev LFTs and CT Abd 9/10 w/ hepatic steatosis; we discussed the need for weight reduction...  ORTHO> LBP, FM, Osteopenia>  Prev lumbar surg from DrBotero, she uses  OTC analgesics, calcium, MVI...  Anxiety>  On Xanax as needed & we will give her a trial of RESTORIL 30mg  Qhs for insomnia...   Patient's Medications  New Prescriptions   DIPHENHYD-HYDROCORT-NYSTATIN (FIRST-DUKES MOUTHWASH) SUSP    1 tsp gargle and swallow four times daily  Previous Medications   ALPRAZOLAM (XANAX) 0.5 MG TABLET    TAKE ONE-HALF TO ONE TABLET BY MOUTH THREE TIMES DAILY AS NEEDED FOR  NERVES.(MAX OF 3 TABLETS PER DAY)   ASPIRIN 325 MG TABLET    Take 325 mg by mouth daily.     CALCIUM CARBONATE-VITAMIN D (CALTRATE 600+D) 600-400 MG-UNIT PER TABLET    Take 1 tablet by mouth daily.     FISH OIL-OMEGA-3 FATTY ACIDS 1000 MG CAPSULE    Take 2 g by mouth daily.     FLUTICASONE (CUTIVATE) 0.05 % CREAM    as needed.   FUROSEMIDE (LASIX) 20 MG TABLET    Take 20 mg by mouth daily.     MECLIZINE (ANTIVERT) 25 MG TABLET    Take 1/2 up to four times a day for dizziness    MULTIPLE VITAMIN (MULTIVITAMIN) CAPSULE    Take 1 capsule by mouth daily.     POTASSIUM CHLORIDE (KLOR-CON) 20 MEQ PACKET  Take 20 mEq by mouth 2 (two) times daily.    Modified Medications   No medications on file  Discontinued Medications   EZETIMIBE (ZETIA) 10 MG TABLET    Take 1 tablet (10 mg total) by mouth daily.   POTASSIUM CHLORIDE SA (K-DUR,KLOR-CON) 20 MEQ TABLET

## 2011-07-05 NOTE — Patient Instructions (Signed)
Today we updated your med list in our EPIC system...    Continue your current medications the same...    We wrote a new prescription for Magic Mouthwash to use every 4-6 H as need for sore throat...  Today we did your follow up fasting blood work...    Please call the PHONE TREE in a few days for your results...    Dial [id number] & when prompted enter your patient number followed by the # symbol...    Your patient number is:  [id number]  Work on diet, exercise & weight reduction...    It would be nice to lose 10-15 lbs...  Call for any questions...  Let's plan a follow up visit in 6 months, sooner if needed for problems.Marland KitchenMarland Kitchen

## 2011-07-06 LAB — VITAMIN D 25 HYDROXY (VIT D DEFICIENCY, FRACTURES): Vit D, 25-Hydroxy: 25 ng/mL — ABNORMAL LOW (ref 30–89)

## 2011-07-10 ENCOUNTER — Encounter: Payer: Self-pay | Admitting: Pulmonary Disease

## 2011-07-13 ENCOUNTER — Telehealth: Payer: Self-pay | Admitting: Pulmonary Disease

## 2011-07-13 ENCOUNTER — Emergency Department (HOSPITAL_COMMUNITY): Payer: Medicare Other

## 2011-07-13 ENCOUNTER — Inpatient Hospital Stay (HOSPITAL_COMMUNITY)
Admission: EM | Admit: 2011-07-13 | Discharge: 2011-07-14 | DRG: 069 | Disposition: A | Discharge status: Home / Self Care | Payer: Medicare Other | Attending: Internal Medicine | Admitting: Internal Medicine

## 2011-07-13 ENCOUNTER — Encounter (HOSPITAL_COMMUNITY): Payer: Self-pay | Admitting: Emergency Medicine

## 2011-07-13 ENCOUNTER — Other Ambulatory Visit: Payer: Self-pay

## 2011-07-13 DIAGNOSIS — I2510 Atherosclerotic heart disease of native coronary artery without angina pectoris: Secondary | ICD-10-CM | POA: Diagnosis present

## 2011-07-13 DIAGNOSIS — IMO0001 Reserved for inherently not codable concepts without codable children: Secondary | ICD-10-CM | POA: Diagnosis present

## 2011-07-13 DIAGNOSIS — D72819 Decreased white blood cell count, unspecified: Secondary | ICD-10-CM | POA: Diagnosis present

## 2011-07-13 DIAGNOSIS — G459 Transient cerebral ischemic attack, unspecified: Principal | ICD-10-CM | POA: Diagnosis present

## 2011-07-13 DIAGNOSIS — Z6836 Body mass index (BMI) 36.0-36.9, adult: Secondary | ICD-10-CM

## 2011-07-13 DIAGNOSIS — Z882 Allergy status to sulfonamides status: Secondary | ICD-10-CM

## 2011-07-13 DIAGNOSIS — E785 Hyperlipidemia, unspecified: Secondary | ICD-10-CM | POA: Diagnosis present

## 2011-07-13 DIAGNOSIS — I10 Essential (primary) hypertension: Secondary | ICD-10-CM | POA: Diagnosis present

## 2011-07-13 DIAGNOSIS — F411 Generalized anxiety disorder: Secondary | ICD-10-CM | POA: Diagnosis present

## 2011-07-13 DIAGNOSIS — D696 Thrombocytopenia, unspecified: Secondary | ICD-10-CM | POA: Diagnosis present

## 2011-07-13 DIAGNOSIS — Z888 Allergy status to other drugs, medicaments and biological substances status: Secondary | ICD-10-CM

## 2011-07-13 DIAGNOSIS — Z79899 Other long term (current) drug therapy: Secondary | ICD-10-CM

## 2011-07-13 DIAGNOSIS — K769 Liver disease, unspecified: Secondary | ICD-10-CM | POA: Diagnosis present

## 2011-07-13 DIAGNOSIS — Z7902 Long term (current) use of antithrombotics/antiplatelets: Secondary | ICD-10-CM

## 2011-07-13 DIAGNOSIS — E669 Obesity, unspecified: Secondary | ICD-10-CM | POA: Diagnosis present

## 2011-07-13 LAB — CBC
HCT: 37.5 % (ref 36.0–46.0)
Hemoglobin: 13 g/dL (ref 12.0–15.0)
MCH: 35.2 pg — ABNORMAL HIGH (ref 26.0–34.0)
MCHC: 34.7 g/dL (ref 30.0–36.0)
MCV: 101.6 fL — ABNORMAL HIGH (ref 78.0–100.0)
Platelets: 120 10*3/uL — ABNORMAL LOW (ref 150–400)
RBC: 3.69 MIL/uL — ABNORMAL LOW (ref 3.87–5.11)
RDW: 15.1 % (ref 11.5–15.5)
WBC: 3.4 10*3/uL — ABNORMAL LOW (ref 4.0–10.5)

## 2011-07-13 LAB — POCT I-STAT TROPONIN I: Troponin i, poc: 0 ng/mL (ref 0.00–0.08)

## 2011-07-13 LAB — BASIC METABOLIC PANEL
BUN: 12 mg/dL (ref 6–23)
CO2: 25 mEq/L (ref 19–32)
Calcium: 9.1 mg/dL (ref 8.4–10.5)
Chloride: 107 mEq/L (ref 96–112)
Creatinine, Ser: 0.68 mg/dL (ref 0.50–1.10)
GFR calc Af Amer: >90 mL/min (ref >90)
GFR calc non Af Amer: 87 mL/min — ABNORMAL LOW (ref >90)
Glucose, Bld: 101 mg/dL — ABNORMAL HIGH (ref 70–99)
Potassium: 3.9 mEq/L (ref 3.5–5.1)
Sodium: 140 mEq/L (ref 135–145)

## 2011-07-13 LAB — URINALYSIS, ROUTINE W REFLEX MICROSCOPIC
Bilirubin Urine: NEGATIVE
Glucose, UA: NEGATIVE mg/dL
Hgb urine dipstick: NEGATIVE
Ketones, ur: NEGATIVE mg/dL
Leukocytes, UA: NEGATIVE
Nitrite: NEGATIVE
Protein, ur: NEGATIVE mg/dL
Specific Gravity, Urine: 1.014 (ref 1.005–1.030)
Urobilinogen, UA: 1 mg/dL (ref 0.0–1.0)
pH: 7.5 (ref 5.0–8.0)

## 2011-07-13 MED ORDER — FUROSEMIDE 20 MG PO TABS
20.0000 mg | ORAL_TABLET | Freq: Every day | ORAL | Status: DC
  Administered 2011-07-13 – 2011-07-14 (×2): 20 mg via ORAL
  Filled 2011-07-13 (×2): qty 1

## 2011-07-13 MED ORDER — MULTIVITAMINS PO CAPS
1.0000 | ORAL_CAPSULE | Freq: Every day | ORAL | Status: DC
  Administered 2011-07-13: 1 via ORAL
  Filled 2011-07-13 (×2): qty 1

## 2011-07-13 MED ORDER — ONDANSETRON HCL 4 MG PO TABS: 4.0000 mg | ORAL_TABLET | Freq: Four times a day (QID) | ORAL | Status: DC | PRN

## 2011-07-13 MED ORDER — ALPRAZOLAM 0.5 MG PO TABS
1.0000 mg | ORAL_TABLET | Freq: Every day | ORAL | Status: DC
Start: 2011-07-13 — End: 2011-07-14
  Administered 2011-07-13: 1 mg via ORAL
  Filled 2011-07-13: qty 2

## 2011-07-13 MED ORDER — ROSUVASTATIN CALCIUM 5 MG PO TABS: 5.0000 mg | ORAL_TABLET | Freq: Every day | ORAL | Status: DC

## 2011-07-13 MED ORDER — CLOPIDOGREL BISULFATE 75 MG PO TABS
75.0000 mg | ORAL_TABLET | Freq: Every day | ORAL | Status: DC
  Administered 2011-07-14: 75 mg via ORAL
  Filled 2011-07-13: qty 1

## 2011-07-13 MED ORDER — CALCIUM CARBONATE-VITAMIN D 500-200 MG-UNIT PO TABS
1.0000 | ORAL_TABLET | Freq: Every day | ORAL | Status: DC
  Administered 2011-07-13 – 2011-07-14 (×2): 1 via ORAL
  Filled 2011-07-13 (×2): qty 1

## 2011-07-13 MED ORDER — ACETAMINOPHEN 650 MG RE SUPP: 650.0000 mg | Freq: Four times a day (QID) | RECTAL | Status: DC | PRN

## 2011-07-13 MED ORDER — ACETAMINOPHEN 325 MG PO TABS
650.0000 mg | ORAL_TABLET | Freq: Four times a day (QID) | ORAL | Status: DC | PRN
  Administered 2011-07-14: 650 mg via ORAL
  Filled 2011-07-13: qty 2

## 2011-07-13 MED ORDER — POTASSIUM CHLORIDE CRYS ER 20 MEQ PO TBCR
20.0000 meq | EXTENDED_RELEASE_TABLET | Freq: Two times a day (BID) | ORAL | Status: DC
  Administered 2011-07-13 – 2011-07-14 (×2): 20 meq via ORAL
  Filled 2011-07-13 (×3): qty 1

## 2011-07-13 MED ORDER — ALPRAZOLAM 0.25 MG PO TABS: 0.2500 mg | ORAL_TABLET | Freq: Three times a day (TID) | ORAL | Status: DC | PRN

## 2011-07-13 MED ORDER — ONDANSETRON HCL 4 MG/2ML IJ SOLN: 4.0000 mg | Freq: Four times a day (QID) | INTRAMUSCULAR | Status: DC | PRN

## 2011-07-13 MED ORDER — FLUTICASONE PROPIONATE 0.05 % EX CREA
1.0000 "application " | TOPICAL_CREAM | CUTANEOUS | Status: DC | PRN
  Filled 2011-07-13: qty 15

## 2011-07-13 NOTE — ED Notes (Addendum)
Headache and paplatations started at 11:15am.  Pt was on phone and felt she couldn't get words out, also forgot what she wanted to say to daughter.  Pt just doesn't feel right and feels confused.  Felt like heart just wasn't beating right.  Family member gave her nitro SL which didn't change pain.  10/10  Per EMS right hand went numb during transport and rt jaw pain which also stopped.  Bandage to left of nose from biopsy yesterday at md office

## 2011-07-13 NOTE — H&P (Addendum)
PCP:  Michele Mcalpine, MD, MD   DOA:  07/13/2011 11:53 AM  Chief Complaint:  Left sided headache, right leg weakness and slurring of speech.  HPI: [name] is a 71 years old Caucasian woman with multiple comorbidities outlined below presented to the ER today with chief complaint of left-sided headache started around 10:30 AM while she was talking to her sister on the phone she also had trouble finding words and then started to have right lower extremity weakness. Patient's symptoms gradually improved while she was in the ER. CT scan of the head showed no acute events and we are asked to admit for further management  Allergies: Allergies  Allergen Reactions  . Codeine     REACTION: nausea  . Diltiazem Hcl     REACTION: pt states it made her dizzy...  . Sulfonamide Derivatives     REACTION: red/splotchy hives    Prior to Admission medications   Medication Sig Start Date End Date Taking? Authorizing Provider  ALPRAZolam Prudy Feeler) 0.5 MG tablet Take 0.25-0.5 mg by mouth 3 (three) times daily as needed. For anxiety   Yes Historical Provider, MD  aspirin 325 MG tablet Take 325 mg by mouth daily.     Yes Historical Provider, MD  Calcium Carbonate-Vitamin D (CALTRATE 600+D) 600-400 MG-UNIT per tablet Take 1 tablet by mouth daily.     Yes Historical Provider, MD  fluticasone (CUTIVATE) 0.05 % cream Apply 1 application topically as needed. Apply to toe 06/28/11  Yes Historical Provider, MD  furosemide (LASIX) 20 MG tablet Take 20 mg by mouth daily.     Yes Historical Provider, MD  Multiple Vitamin (MULTIVITAMIN) capsule Take 1 capsule by mouth daily.     Yes Historical Provider, MD  potassium chloride SA (K-DUR,KLOR-CON) 20 MEQ tablet Take 20 mEq by mouth 2 (two) times daily.   Yes Historical Provider, MD    Past Medical History  Diagnosis Date  . Unspecified essential hypertension   . CAD (coronary artery disease)   . Other and unspecified hyperlipidemia   . Obesity, unspecified   .  Diverticulosis of colon (without mention of hemorrhage)   . Other chronic nonalcoholic liver disease   . Myalgia and myositis, unspecified   . Disorder of bone and cartilage, unspecified   . Dizziness and giddiness   . Anxiety state, unspecified   History of fatty liver disease  Past Surgical History  Procedure Date  . Tonsillectomy and adenoidectomy   . Total abdominal hysterectomy w/ bilateral salpingoophorectomy   . Cholecystectomy   . Back surgery   . Mohs surgery     Social History:  Lives with husband, reports that she has never smoked. . She reports that she does not drink alcohol or use illicit drugs.  Family History  Problem Relation Age of Onset  . Stomach cancer Mother   . Cancer Brother   . Breast cancer Sister   . Diabetes      Review of Systems:  Constitutional: Denies fever, chills, diaphoresis, appetite change and fatigue.   Respiratory: Denies SOB, DOE, cough, chest tightness,  and wheezing.   Cardiovascular: Denies chest pain, palpitations and leg swelling.  Gastrointestinal: Denies nausea, vomiting, abdominal pain, diarrhea, constipation, blood in stool and abdominal distention.  Genitourinary: Denies dysuria, urgency, frequency,  Neurological: Denies dizziness, seizures, syncope, , light-headedness, numbnessC/O  Headaches, difficulty finding words and right lower extremity weakness.  Hematological: Denies adenopathy. Easy bruising, personal or family bleeding history     Physical Exam:  Ceasar Mons  Vitals:   07/13/11 1212 07/13/11 1409 07/13/11 1631 07/13/11 1819  BP: 143/73 123/98 148/67 152/69  Pulse: 75 66 66 69  Temp:  97.8 F (36.6 C)    TempSrc:  Oral    Resp:  18 16 18   SpO2: 95% 98% 96% 97%    Constitutional: Vital signs reviewed.  Patient is a well-developed and well-nourished in no acute distress and cooperative with exam. Alert and oriented x3.  Eyes: PERRL, EOMI, conjunctivae normal, No scleral icterus.  Neck: Supple, Trachea midline  normal ROM, No JVD, mass, thyromegaly, or carotid bruit present.  Cardiovascular: RRR, S1 normal, S2 normal, no MRG, pulses symmetric and intact bilaterally Pulmonary/Chest: CTAB, no wheezes, rales, or rhonchi Abdominal: Soft. Non-tender, non-distended, bowel sounds are normal, no masses, organomegaly, or guarding present.  Ext: no edema and no cyanosis, pulses palpable bilaterally  Hematology: no cervical, inginal, or axillary adenopathy.  Neurological: A&O x3, Strenght is normal and symmetric in upper extremities bilaterally and left lower extremity, right lower extremity  4+/5.cranial nerve II-XII are grossly intact, no focal motor deficit, sensory intact to light touch bilaterally.    Labs on Admission:  Results for orders placed during the hospital encounter of 07/13/11 (from the past 48 hour(s))  BASIC METABOLIC PANEL     Status: Abnormal   Collection Time   07/13/11  1:39 PM      Component Value Range Comment   Sodium 140  135 - 145 (mEq/L)    Potassium 3.9  3.5 - 5.1 (mEq/L)    Chloride 107  96 - 112 (mEq/L)    CO2 25  19 - 32 (mEq/L)    Glucose, Bld 101 (*) 70 - 99 (mg/dL)    BUN 12  6 - 23 (mg/dL)    Creatinine, Ser 1.61  0.50 - 1.10 (mg/dL)    Calcium 9.1  8.4 - 10.5 (mg/dL)    GFR calc non Af Amer 87 (*) >90 (mL/min)    GFR calc Af Amer >90  >90 (mL/min)   CBC     Status: Abnormal   Collection Time   07/13/11  1:39 PM      Component Value Range Comment   WBC 3.4 (*) 4.0 - 10.5 (K/uL)    RBC 3.69 (*) 3.87 - 5.11 (MIL/uL)    Hemoglobin 13.0  12.0 - 15.0 (g/dL)    HCT 09.6  04.5 - 40.9 (%)    MCV 101.6 (*) 78.0 - 100.0 (fL)    MCH 35.2 (*) 26.0 - 34.0 (pg)    MCHC 34.7  30.0 - 36.0 (g/dL)    RDW 81.1  91.4 - 78.2 (%)    Platelets 120 (*) 150 - 400 (K/uL)   POCT I-STAT TROPONIN I     Status: Normal   Collection Time   07/13/11  2:19 PM      Component Value Range Comment   Troponin i, poc 0.00  0.00 - 0.08 (ng/mL)    Comment 3            URINALYSIS, ROUTINE W REFLEX  MICROSCOPIC     Status: Normal   Collection Time   07/13/11  2:24 PM      Component Value Range Comment   Color, Urine YELLOW  YELLOW     APPearance CLEAR  CLEAR     Specific Gravity, Urine 1.014  1.005 - 1.030     pH 7.5  5.0 - 8.0     Glucose, UA NEGATIVE  NEGATIVE (mg/dL)  Hgb urine dipstick NEGATIVE  NEGATIVE     Bilirubin Urine NEGATIVE  NEGATIVE     Ketones, ur NEGATIVE  NEGATIVE (mg/dL)    Protein, ur NEGATIVE  NEGATIVE (mg/dL)    Urobilinogen, UA 1.0  0.0 - 1.0 (mg/dL)    Nitrite NEGATIVE  NEGATIVE     Leukocytes, UA NEGATIVE  NEGATIVE  MICROSCOPIC NOT DONE ON URINES WITH NEGATIVE PROTEIN, BLOOD, LEUKOCYTES, NITRITE, OR GLUCOSE <1000 mg/dL.    Radiological Exams on Admission: Dg Chest 2 View  07/13/2011  *RADIOLOGY REPORT*  Clinical Data: Chest pain, headache.  CHEST - 2 VIEW  Comparison: 09/25/2006  Findings: Heart and mediastinal contours are within normal limits. No focal opacities or effusions.  No acute bony abnormality.  IMPRESSION: No active cardiopulmonary disease.  Original Report Authenticated By: Cyndie Chime, M.D.   Ct Head Wo Contrast  07/13/2011  *RADIOLOGY REPORT*  Clinical Data: Sudden headache, blurred vision  CT HEAD WITHOUT CONTRAST  Technique:  Contiguous axial images were obtained from the base of the skull through the vertex without contrast.  Comparison: 04/07/2006  Findings: No evidence of parenchymal hemorrhage or extra-axial fluid collection. No mass lesion, mass effect, or midline shift.  No CT evidence of acute infarction.  Subcortical white matter and periventricular small vessel ischemic changes.  Cerebral volume is age appropriate.  No ventriculomegaly.  The visualized paranasal sinuses are essentially clear. The mastoid air cells are unopacified.  No evidence of calvarial fracture.  IMPRESSION: No evidence of acute intracranial abnormality.  Small vessel ischemic changes.  Original Report Authenticated By: Charline Bills, M.D.     Assessment/Plan Principal Problem:  *TIA (transient ischemic attack) Active Problems:  HYPERTENSION History of nonobstructive coronary artery disease Hyperlipidemia Chronic leukopenia Thrombocytopenia Plan She'll be admitted to telemetry Appreciate neurology input TIA workup including MRI/MRA brain, 2-D echocardiogram and carotid duplex. Start Plavix as per neurology recommendation Check hemoglobin A1c and lipid panel PT evaluation Noted to have mild leukopenia and thrombocytopenia, etiology unclear possibly secondary to liver disease. Further workup can probably be done as an outpatient. Full code   Time Spent on Admission: 50 minutes  [name] 07/13/2011, 7:21 PM

## 2011-07-13 NOTE — Consult Note (Signed)
Referring Physician: Cleotis Lema     Chief Complaint: Headache, difficulty with speech and right leg weakness  HPI: [name] is an 71 y.o. female who reports that she was talking with her sister on the phone this morning at about 1030 and had acute onset of a left temporal headache.  The patient dose not usually get headaches and soon noted that she knew what she wanted to say but was uable to get the words out.  Then noted RLE weakness.  Did not note any upper extremity abnormalities and noted no sensory problems.  Patient presented to ED at that time and reports that by 2pm her symptoms had resolved.  Patient feels she is back to baseline at this time.  LSN: 1030 am tPA Given: No: Resolution of symptoms mRankin: 0  Past Medical History  Diagnosis Date  . Unspecified essential hypertension   . CAD (coronary artery disease)   . Other and unspecified hyperlipidemia   . Obesity, unspecified   . Diverticulosis of colon (without mention of hemorrhage)   . Other chronic nonalcoholic liver disease   . Myalgia and myositis, unspecified   . Disorder of bone and cartilage, unspecified   . Dizziness and giddiness   . Anxiety state, unspecified     Past Surgical History  Procedure Date  . Tonsillectomy and adenoidectomy   . Total abdominal hysterectomy w/ bilateral salpingoophorectomy   . Cholecystectomy   . Back surgery   . Mohs surgery     Family History  Problem Relation Age of Onset  . Stomach cancer Mother   . Cancer Brother   . Breast cancer Sister   . Diabetes     Social History:  reports that she has never smoked. She does not have any smokeless tobacco history on file. She reports that she does not drink alcohol or use illicit drugs.  Allergies:  Allergies  Allergen Reactions  . Codeine     REACTION: nausea  . Diltiazem Hcl     REACTION: pt states it made her dizzy...  . Sulfonamide Derivatives     REACTION: red/splotchy hives    Medications: I have reviewed the  patient's current medications. Prior to Admission:  Xanax, ASA, Cutivate, Lasix, MVI, K-dur, Vitamin D  ROS: History obtained from the patient  General ROS: negative for - chills, fatigue, fever, night sweats, weight gain or weight loss Psychological ROS: negative for - behavioral disorder, hallucinations, memory difficulties, mood swings or suicidal ideation Ophthalmic ROS: negative for - blurry vision, double vision, eye pain or loss of vision ENT ROS: negative for - epistaxis, nasal discharge, oral lesions, sore throat, tinnitus or vertigo Allergy and Immunology ROS: negative for - hives or itchy/watery eyes Hematological and Lymphatic ROS: negative for - bleeding problems, bruising or swollen lymph nodes Endocrine ROS: hot neck Respiratory ROS: negative for - cough, hemoptysis, shortness of breath or wheezing Cardiovascular ROS: negative for - chest pain, dyspnea on exertion, edema or irregular heartbeat Gastrointestinal ROS: nausea Genito-Urinary ROS: negative for - dysuria, hematuria, incontinence or urinary frequency/urgency Musculoskeletal ROS: negative for - joint swelling or muscular weakness Neurological ROS: as noted in HPI Dermatological ROS: negative for rash and skin lesion changes  Physical Examination: Blood pressure 152/69, pulse 69, temperature 97.8 F (36.6 C), temperature source Oral, resp. rate 18, SpO2 97.00%.  Neurologic Examination: Mental Status: Alert, oriented, thought content appropriate.  Speech fluent without evidence of aphasia.  Able to follow 3 step commands without difficulty. Cranial Nerves: II: visual fields  grossly normal, pupils equal, round, reactive to light and accommodation III,IV, VI: ptosis not present, extra-ocular motions intact bilaterally V,VII: smile symmetric, facial light touch sensation normal bilaterally VIII: hearing normal bilaterally IX,X: gag reflex present XI: trapezius strength/neck flexion strength normal  bilaterally XII: tongue strength normal  Motor: Right : Upper extremity   5/5    Left:     Upper extremity   5/5  Lower extremity   5/5     Lower extremity   5/5 Tone and bulk:normal tone throughout; no atrophy noted Sensory: Pinprick and light touch intact throughout, bilaterally Deep Tendon Reflexes: 1+ and symmetric with absent AJ's bilaterally Plantars: Right: downgoing   Left: downgoing Cerebellar: normal finger-to-nose and normal heel-to-shin test   Results for orders placed during the hospital encounter of 07/13/11 (from the past 48 hour(s))  BASIC METABOLIC PANEL     Status: Abnormal   Collection Time   07/13/11  1:39 PM      Component Value Range Comment   Sodium 140  135 - 145 (mEq/L)    Potassium 3.9  3.5 - 5.1 (mEq/L)    Chloride 107  96 - 112 (mEq/L)    CO2 25  19 - 32 (mEq/L)    Glucose, Bld 101 (*) 70 - 99 (mg/dL)    BUN 12  6 - 23 (mg/dL)    Creatinine, Ser 1.47  0.50 - 1.10 (mg/dL)    Calcium 9.1  8.4 - 10.5 (mg/dL)    GFR calc non Af Amer 87 (*) >90 (mL/min)    GFR calc Af Amer >90  >90 (mL/min)   CBC     Status: Abnormal   Collection Time   07/13/11  1:39 PM      Component Value Range Comment   WBC 3.4 (*) 4.0 - 10.5 (K/uL)    RBC 3.69 (*) 3.87 - 5.11 (MIL/uL)    Hemoglobin 13.0  12.0 - 15.0 (g/dL)    HCT 82.9  56.2 - 13.0 (%)    MCV 101.6 (*) 78.0 - 100.0 (fL)    MCH 35.2 (*) 26.0 - 34.0 (pg)    MCHC 34.7  30.0 - 36.0 (g/dL)    RDW 86.5  78.4 - 69.6 (%)    Platelets 120 (*) 150 - 400 (K/uL)   POCT I-STAT TROPONIN I     Status: Normal   Collection Time   07/13/11  2:19 PM      Component Value Range Comment   Troponin i, poc 0.00  0.00 - 0.08 (ng/mL)    Comment 3            URINALYSIS, ROUTINE W REFLEX MICROSCOPIC     Status: Normal   Collection Time   07/13/11  2:24 PM      Component Value Range Comment   Color, Urine YELLOW  YELLOW     APPearance CLEAR  CLEAR     Specific Gravity, Urine 1.014  1.005 - 1.030     pH 7.5  5.0 - 8.0     Glucose, UA  NEGATIVE  NEGATIVE (mg/dL)    Hgb urine dipstick NEGATIVE  NEGATIVE     Bilirubin Urine NEGATIVE  NEGATIVE     Ketones, ur NEGATIVE  NEGATIVE (mg/dL)    Protein, ur NEGATIVE  NEGATIVE (mg/dL)    Urobilinogen, UA 1.0  0.0 - 1.0 (mg/dL)    Nitrite NEGATIVE  NEGATIVE     Leukocytes, UA NEGATIVE  NEGATIVE  MICROSCOPIC NOT DONE ON URINES WITH NEGATIVE  PROTEIN, BLOOD, LEUKOCYTES, NITRITE, OR GLUCOSE <1000 mg/dL.   Dg Chest 2 View  07/13/2011  *RADIOLOGY REPORT*  Clinical Data: Chest pain, headache.  CHEST - 2 VIEW  Comparison: 09/25/2006  Findings: Heart and mediastinal contours are within normal limits. No focal opacities or effusions.  No acute bony abnormality.  IMPRESSION: No active cardiopulmonary disease.  Original Report Authenticated By: Cyndie Chime, M.D.   Ct Head Wo Contrast  07/13/2011  *RADIOLOGY REPORT*  Clinical Data: Sudden headache, blurred vision  CT HEAD WITHOUT CONTRAST  Technique:  Contiguous axial images were obtained from the base of the skull through the vertex without contrast.  Comparison: 04/07/2006  Findings: No evidence of parenchymal hemorrhage or extra-axial fluid collection. No mass lesion, mass effect, or midline shift.  No CT evidence of acute infarction.  Subcortical white matter and periventricular small vessel ischemic changes.  Cerebral volume is age appropriate.  No ventriculomegaly.  The visualized paranasal sinuses are essentially clear. The mastoid air cells are unopacified.  No evidence of calvarial fracture.  IMPRESSION: No evidence of acute intracranial abnormality.  Small vessel ischemic changes.  Original Report Authenticated By: Charline Bills, M.D.    Assessment: 71 y.o. female presenting with headache, speech abnormalities and RLE weakness.  Patient now back to baseline.  CT shows no acute changes.  Left brain TIA remains on the differential.  Further work up recommended.  Stroke Risk Factors - hypertension, hyperlipidemia and CAD  Plan: 1. HgbA1c,  fasting lipid panel 2. MRI, MRA  of the brain without contrast 3. Echocardiogram 4. Carotid dopplers 5. Prophylactic therapy-Antiplatelet med: Plavix - dose 75mg  daily 6. Risk factor modification 7. Telemetry monitoring   Thana Farr, MD [facility] [phone] 07/13/2011, 6:38 PM

## 2011-07-13 NOTE — ED Notes (Signed)
EKG given to MD and signed off

## 2011-07-13 NOTE — Telephone Encounter (Signed)
I called and spoke with family member and EMS was at the house to take the pt to ER. Carron Curie, CMA

## 2011-07-13 NOTE — ED Notes (Signed)
Report called pt. Transferred to floor via stretcher with tele tech, NAD noted

## 2011-07-13 NOTE — ED Notes (Signed)
Admitting MD at bedside for consult.

## 2011-07-13 NOTE — ED Notes (Signed)
Pt. Alert and oriented, NAD noted, pt. Awaiting bed assignment

## 2011-07-13 NOTE — ED Notes (Signed)
Pt. Finished with meal tray and family at bedside.

## 2011-07-13 NOTE — ED Notes (Signed)
Pt states she was talking on phone and got a terrible headache in her temples.  She was unable to get her thoughts out at that time.  Then was reading a letter to her daughter and was unable to make out the words on the paper. She felt as if she would vomit but never did. Just doesn't feel right in her mind.  Vision was blurred and right leg felt as if it would not hold her weight.  Headache has since reduced to a 2/10 and vision is coming back to normal.  Still feels foggy.  Pt alert and oriented x4.  Answering questions appropriatly.

## 2011-07-13 NOTE — ED Provider Notes (Signed)
History    71 year old female with multiple complaints. Patient was on the phone with her daughter around 11:15 AM today when she felt like she couldn't speak normally. Her speech sounded slurred to her and also per her granddaughter. Also had some difficulty with word finding. Felt like she couldn't focus appropriately. When trying to ambulate just after this she had to be helped. Right leg felt heavy. Currently no complaints aside from a mild headache. Diffuse. Denies trauma. Denies use of blood thinning medications aside from a daily aspirin. Denies history of TIA or CVA. No visual complaints.  CSN: [id number]  Arrival date & time 07/13/11  1152   First MD Initiated Contact with Patient 07/13/11 1203      Chief Complaint  Patient presents with  . Headache    (Consider location/radiation/quality/duration/timing/severity/associated sxs/prior treatment) HPI  Past Medical History  Diagnosis Date  . Unspecified essential hypertension   . CAD (coronary artery disease)   . Other and unspecified hyperlipidemia   . Obesity, unspecified   . Diverticulosis of colon (without mention of hemorrhage)   . Other chronic nonalcoholic liver disease   . Myalgia and myositis, unspecified   . Disorder of bone and cartilage, unspecified   . Dizziness and giddiness   . Anxiety state, unspecified     Past Surgical History  Procedure Date  . Tonsillectomy and adenoidectomy   . Total abdominal hysterectomy w/ bilateral salpingoophorectomy   . Cholecystectomy   . Back surgery   . Mohs surgery     Family History  Problem Relation Age of Onset  . Stomach cancer Mother   . Cancer Brother   . Breast cancer Sister   . Diabetes      History  Substance Use Topics  . Smoking status: Never Smoker   . Smokeless tobacco: Not on file  . Alcohol Use: No    OB History    Grav Para Term Preterm Abortions TAB SAB Ect Mult Living                  Review of Systems   Review of symptoms negative  unless otherwise noted in HPI.   Allergies  Codeine; Diltiazem hcl; and Sulfonamide derivatives  Home Medications   Current Outpatient Rx  Name Route Sig Dispense Refill  . ALPRAZOLAM 0.5 MG PO TABS Oral Take 0.25-0.5 mg by mouth 3 (three) times daily as needed. For anxiety    . ASPIRIN 325 MG PO TABS Oral Take 325 mg by mouth daily.      Marland Kitchen CALCIUM CARBONATE-VITAMIN D 600-400 MG-UNIT PO TABS Oral Take 1 tablet by mouth daily.      Marland Kitchen FLUTICASONE PROPIONATE 0.05 % EX CREA Topical Apply 1 application topically as needed. Apply to toe    . FUROSEMIDE 20 MG PO TABS Oral Take 20 mg by mouth daily.      . MULTIVITAMINS PO CAPS Oral Take 1 capsule by mouth daily.      Marland Kitchen POTASSIUM CHLORIDE CRYS ER 20 MEQ PO TBCR Oral Take 20 mEq by mouth 2 (two) times daily.      BP 123/98  Pulse 66  Temp(Src) 97.8 F (36.6 C) (Oral)  Resp 18  SpO2 98%  Physical Exam  Nursing note and vitals reviewed. Constitutional: She is oriented to person, place, and time. No distress.       Leg in bed. No acute distress. Obese.  HENT:  Head: Normocephalic and atraumatic.  Eyes: Conjunctivae are normal. Right eye exhibits no  discharge. Left eye exhibits no discharge.  Neck: Normal range of motion. Neck supple.  Cardiovascular: Normal rate, regular rhythm and normal heart sounds.  Exam reveals no gallop and no friction rub.   No murmur heard. Pulmonary/Chest: Effort normal and breath sounds normal. No respiratory distress.  Abdominal: Soft. She exhibits no distension. There is no tenderness.  Musculoskeletal: She exhibits no edema and no tenderness.  Lymphadenopathy:    She has no cervical adenopathy.  Neurological: She is alert and oriented to person, place, and time. No cranial nerve deficit. She exhibits normal muscle tone. Coordination normal.       Good finger to nose testing bilaterally. Good heel-to-shin testing bilaterally. Strength 5 out of 5 upper and lower extremities. Sensation is grossly intact to  light touch.  Skin: Skin is warm and dry.  Psychiatric: She has a normal mood and affect. Her behavior is normal. Thought content normal.    ED Course  Procedures (including critical care time)  Labs Reviewed  BASIC METABOLIC PANEL - Abnormal; Notable for the following:    Glucose, Bld 101 (*)    GFR calc non Af Amer 87 (*)    All other components within normal limits  CBC - Abnormal; Notable for the following:    WBC 3.4 (*)    RBC 3.69 (*)    MCV 101.6 (*)    MCH 35.2 (*)    Platelets 120 (*)    All other components within normal limits  URINALYSIS, ROUTINE W REFLEX MICROSCOPIC  POCT I-STAT TROPONIN I  I-STAT TROPONIN I   Dg Chest 2 View  07/13/2011  *RADIOLOGY REPORT*  Clinical Data: Chest pain, headache.  CHEST - 2 VIEW  Comparison: 09/25/2006  Findings: Heart and mediastinal contours are within normal limits. No focal opacities or effusions.  No acute bony abnormality.  IMPRESSION: No active cardiopulmonary disease.  Original Report Authenticated By: Cyndie Chime, M.D.   Ct Head Wo Contrast  07/13/2011  *RADIOLOGY REPORT*  Clinical Data: Sudden headache, blurred vision  CT HEAD WITHOUT CONTRAST  Technique:  Contiguous axial images were obtained from the base of the skull through the vertex without contrast.  Comparison: 04/07/2006  Findings: No evidence of parenchymal hemorrhage or extra-axial fluid collection. No mass lesion, mass effect, or midline shift.  No CT evidence of acute infarction.  Subcortical white matter and periventricular small vessel ischemic changes.  Cerebral volume is age appropriate.  No ventriculomegaly.  The visualized paranasal sinuses are essentially clear. The mastoid air cells are unopacified.  No evidence of calvarial fracture.  IMPRESSION: No evidence of acute intracranial abnormality.  Small vessel ischemic changes.  Original Report Authenticated By: Charline Bills, M.D.     1. TIA (transient ischemic attack)       MDM  71 year old female  with some dysarthria and right lower extremity weakness. Patient has no objective neurological findings but her symptoms are concerning for a TIA. Patient not appropriate for her the emergency room TIA protocol given risk factors including age, hypertension among other things. Discussed with medicine and will admit for further evaluation. Mild leukopenia of uncertain significance. No infectious symptoms. Mild leukopenia was also noticed on review of records.       Raeford Razor, MD 07/13/11 1620

## 2011-07-13 NOTE — ED Notes (Signed)
Pt states her left arm hurts in the bone.  Her right leg feels weak.  Head is still hurting in her left eye.  Pain is 2-3/10 and is deep inside

## 2011-07-13 NOTE — ED Notes (Signed)
Pt denies any discomfort or pain.  Pt reports "soreness" to RLE, reports pain with palpation to R calf.  Pt is alert, oriented with family at bedside.

## 2011-07-14 ENCOUNTER — Inpatient Hospital Stay (HOSPITAL_COMMUNITY): Payer: Medicare Other

## 2011-07-14 DIAGNOSIS — I517 Cardiomegaly: Secondary | ICD-10-CM

## 2011-07-14 DIAGNOSIS — G459 Transient cerebral ischemic attack, unspecified: Secondary | ICD-10-CM

## 2011-07-14 LAB — CBC
MCH: 35.5 pg — ABNORMAL HIGH (ref 26.0–34.0)
MCHC: 34.9 g/dL (ref 30.0–36.0)
MCV: 101.7 fL — ABNORMAL HIGH (ref 78.0–100.0)
Platelets: 124 10*3/uL — ABNORMAL LOW (ref 150–400)

## 2011-07-14 LAB — LIPID PANEL
Cholesterol: 238 mg/dL — ABNORMAL HIGH (ref 0–200)
Total CHOL/HDL Ratio: 8.2 RATIO

## 2011-07-14 LAB — COMPREHENSIVE METABOLIC PANEL
ALT: 33 U/L (ref 0–35)
AST: 41 U/L — ABNORMAL HIGH (ref 0–37)
CO2: 28 mEq/L (ref 19–32)
Chloride: 105 mEq/L (ref 96–112)
GFR calc non Af Amer: 83 mL/min — ABNORMAL LOW (ref >90)
Potassium: 4 mEq/L (ref 3.5–5.1)
Sodium: 140 mEq/L (ref 135–145)
Total Bilirubin: 0.8 mg/dL (ref 0.3–1.2)

## 2011-07-14 LAB — HEMOGLOBIN A1C
Hgb A1c MFr Bld: 5.7 % — ABNORMAL HIGH (ref <5.7)
Mean Plasma Glucose: 126 mg/dL — ABNORMAL HIGH (ref <117)

## 2011-07-14 MED ORDER — CO Q 10 100 MG PO CAPS: 1.0000 | ORAL_CAPSULE | Freq: Every day | ORAL | Status: DC

## 2011-07-14 MED ORDER — ATORVASTATIN CALCIUM 20 MG PO TABS: 20.0000 mg | ORAL_TABLET | Freq: Every day | ORAL | Status: DC

## 2011-07-14 MED ORDER — ADULT MULTIVITAMIN W/MINERALS CH
1.0000 | ORAL_TABLET | Freq: Every day | ORAL | Status: DC
  Administered 2011-07-14: 1 via ORAL

## 2011-07-14 MED ORDER — CLOPIDOGREL BISULFATE 75 MG PO TABS: 75.0000 mg | ORAL_TABLET | Freq: Every day | ORAL | Status: DC

## 2011-07-14 NOTE — Discharge Summary (Signed)
Physician Discharge Summary  Patient ID: [name] MRN: [id number] DOB/AGE: Aug 27, 1940 71 y.o.  Admit date: 07/13/2011 Discharge date: 07/14/2011  Primary Care Physician:  Michele Mcalpine, MD, MD  Discharge Diagnoses:    .TIA (transient ischemic attack) Hyperlipidemia Hypertension CAD Anxiety History of chronic nonalcoholic liver disease   Consults:  Neurology, Dr Pearlean Brownie   Discharge Medications: Medication List  As of 07/14/2011  5:45 PM   STOP taking these medications         aspirin 325 MG tablet         TAKE these medications         ALPRAZolam 0.5 MG tablet   Commonly known as: XANAX   Take 0.25-0.5 mg by mouth 3 (three) times daily as needed. For anxiety      atorvastatin 20 MG tablet   Commonly known as: LIPITOR   Take 1 tablet (20 mg total) by mouth daily.      CALTRATE 600+D 600-400 MG-UNIT per tablet   Generic drug: Calcium Carbonate-Vitamin D   Take 1 tablet by mouth daily.      clopidogrel 75 MG tablet   Commonly known as: PLAVIX   Take 1 tablet (75 mg total) by mouth daily with breakfast.      Co Q 10 100 MG Caps   Take 1 capsule by mouth daily.      fluticasone 0.05 % cream   Commonly known as: CUTIVATE   Apply 1 application topically as needed. Apply to toe      furosemide 20 MG tablet   Commonly known as: LASIX   Take 20 mg by mouth daily.      multivitamin capsule   Take 1 capsule by mouth daily.      potassium chloride SA 20 MEQ tablet   Commonly known as: K-DUR,KLOR-CON   Take 20 mEq by mouth 2 (two) times daily.             Brief H and P: For complete details please refer to admission H and P, but in brief, patient is a 71 year old Caucasian woman  presented to the ER today with chief complaint of left-sided headache started around 10:30 AM while she was talking to her sister on the phone yesterday on the day of admission. she also had trouble finding words and then started to have right lower extremity weakness. Patient's  symptoms gradually improved while she was in the ER. CT scan of the head showed no acute events and patient was admitted for stroke workup   Hospital Course:  Patient is a 71 year old female with sudden onset of right hemiparesis with likely left brain TIA, secondary to unknown etiology, patient was on aspirin 325 mg daily at admission. Patient was admitted for TIA/stroke workup. 1. TIA: Patient was placed on Plavix for secondary stroke prevention. Full stroke workup was initiated, patient underwent MRI of the head which was negative for acute infarct and had mild chronic microvascular ischemic white matter. MRA showed no significant intracranial abnormality. Carotid Doppler showed no significant ICA stenosis bilaterally, patent vertebral arteries. 2-D echo showed EF of 60% with normal wall motion and no regional wall motion abnormalities, no cardiac source of embolism was identified. Patient was followed by stroke service, Dr. Pearlean Brownie. She did not receive TPA secondary to resolution of symptoms. Lipid panel showed elevated cholesterol, triglycerides and LDL. Patient has been intolerant to Zocor, Crestor, Zetia in the past. Low-dose Lipitor was added with Co-Q-10 to less of the side effects.  The patient was evaluated by physical therapy and no PT needs were identified as patient was back to baseline.  Day of Discharge BP 137/84  Pulse 67  Temp(Src) 97.9 F (36.6 C) (Oral)  Resp 18  Ht 5\' 3"  (1.6 m)  Wt 93.8 kg (206 lb 12.7 oz)  BMI 36.63 kg/m2  SpO2 92%  Physical Exam: General: Alert and awake oriented x3 not in any acute distress. HEENT: anicteric sclera, pupils reactive to light and accommodation CVS: S1-S2 clear no murmur rubs or gallops Chest: clear to auscultation bilaterally, no wheezing rales or rhonchi Abdomen: soft nontender, nondistended, normal bowel sounds, no organomegaly Extremities: no cyanosis, clubbing or edema noted bilaterally Neuro: Cranial nerves II-XII intact, no focal  neurological deficits   The results of significant diagnostics from this hospitalization (including imaging, microbiology, ancillary and laboratory) are listed below for reference.    LAB RESULTS: Basic Metabolic Panel:  Lab 07/14/11 1610 07/13/11 1339  NA 140 140  K 4.0 3.9  CL 105 107  CO2 28 25  GLUCOSE 110* 101*  BUN 13 12  CREATININE 0.78 0.68  CALCIUM 9.2 9.1  MG -- --  PHOS -- --   Liver Function Tests:  Lab 07/14/11 0532  AST 41*  ALT 33  ALKPHOS 117  BILITOT 0.8  PROT 6.5  ALBUMIN 3.5   CBC:  Lab 07/14/11 0532 07/13/11 1339  WBC 3.7* 3.4*  NEUTROABS -- --  HGB 12.8 13.0  HCT 36.7 37.5  MCV 101.7* --  PLT 124* 120*    Significant Diagnostic Studies:  Dg Chest 2 View  07/13/2011  *RADIOLOGY REPORT*  Clinical Data: Chest pain, headache.  CHEST - 2 VIEW  Comparison: 09/25/2006  Findings: Heart and mediastinal contours are within normal limits. No focal opacities or effusions.  No acute bony abnormality.  IMPRESSION: No active cardiopulmonary disease.  Original Report Authenticated By: Cyndie Chime, M.D.   Ct Head Wo Contrast  07/13/2011  *RADIOLOGY REPORT*  Clinical Data: Sudden headache, blurred vision  CT HEAD WITHOUT CONTRAST  Technique:  Contiguous axial images were obtained from the base of the skull through the vertex without contrast.  Comparison: 04/07/2006  Findings: No evidence of parenchymal hemorrhage or extra-axial fluid collection. No mass lesion, mass effect, or midline shift.  No CT evidence of acute infarction.  Subcortical white matter and periventricular small vessel ischemic changes.  Cerebral volume is age appropriate.  No ventriculomegaly.  The visualized paranasal sinuses are essentially clear. The mastoid air cells are unopacified.  No evidence of calvarial fracture.  IMPRESSION: No evidence of acute intracranial abnormality.  Small vessel ischemic changes.  Original Report Authenticated By: Charline Bills, M.D.   Mr [facility] Wo  Contrast  07/14/2011  *RADIOLOGY REPORT*  Clinical Data:  Right leg weakness.  Slurred speech.  MRI HEAD WITHOUT CONTRAST MRA HEAD WITHOUT CONTRAST  Technique:  Multiplanar, multiecho pulse sequences of the brain and surrounding structures were obtained without intravenous contrast. Angiographic images of the head were obtained using MRA technique without contrast.  Comparison:  CT 07/13/2011  MRI HEAD  Findings:  Negative for acute infarct.  Scattered hyperintensities in the deep cerebral white matter bilaterally.  This pattern is most compatible with chronic microvascular ischemia of a mild degree.  Brainstem and cerebellum are normal.  Ventricle size is normal.  Negative for hemorrhage or mass lesion. Paranasal sinuses are clear.  IMPRESSION: Mild chronic microvascular ischemia in the white matter.  No acute abnormality.  MRA HEAD  Findings:  Small vertebral basilar system due to fetal origin of the posterior cerebral artery bilaterally.  This is a normal variant.  Both vertebral arteries are patent to the basilar.  PICA, AICA, and superior cerebellar arteries are patent bilaterally.  Hypoplastic distal basilar artery.  Left P1 segment is not visualized.  Small right P1 segment is present.  Internal carotid artery is patent bilaterally through the cavernous segment without significant stenosis.  Anterior and middle cerebral arteries are patent bilaterally without stenosis.  Negative for cerebral aneurysm.  IMPRESSION: No significant intracranial abnormality.  Normal variant anatomy in the circle of Willis.  Original Report Authenticated By: Camelia Phenes, M.D.   Mr Brain Wo Contrast  07/14/2011  *RADIOLOGY REPORT*  Clinical Data:  Right leg weakness.  Slurred speech.  MRI HEAD WITHOUT CONTRAST MRA HEAD WITHOUT CONTRAST  Technique:  Multiplanar, multiecho pulse sequences of the brain and surrounding structures were obtained without intravenous contrast. Angiographic images of the head were obtained using MRA  technique without contrast.  Comparison:  CT 07/13/2011  MRI HEAD  Findings:  Negative for acute infarct.  Scattered hyperintensities in the deep cerebral white matter bilaterally.  This pattern is most compatible with chronic microvascular ischemia of a mild degree.  Brainstem and cerebellum are normal.  Ventricle size is normal.  Negative for hemorrhage or mass lesion. Paranasal sinuses are clear.  IMPRESSION: Mild chronic microvascular ischemia in the white matter.  No acute abnormality.  MRA HEAD  Findings: Small vertebral basilar system due to fetal origin of the posterior cerebral artery bilaterally.  This is a normal variant.  Both vertebral arteries are patent to the basilar.  PICA, AICA, and superior cerebellar arteries are patent bilaterally.  Hypoplastic distal basilar artery.  Left P1 segment is not visualized.  Small right P1 segment is present.  Internal carotid artery is patent bilaterally through the cavernous segment without significant stenosis.  Anterior and middle cerebral arteries are patent bilaterally without stenosis.  Negative for cerebral aneurysm.  IMPRESSION: No significant intracranial abnormality.  Normal variant anatomy in the circle of Willis.  Original Report Authenticated By: Camelia Phenes, M.D.     Disposition and Follow-up: Discharge Orders    Future Appointments: Provider: Department: Dept Phone: Center:   01/04/2012 10:30 AM Michele Mcalpine, MD [facility] [phone] None     Future Orders Please Complete By Expires   Diet - low sodium heart healthy      Increase activity slowly      Discharge instructions      Comments:   Please check with your PCP if you experience any side-effects from Lipitor. Taking with Co-Q 10 should lessen the side effects,       DISPOSITION: Home DIET: Heart healthy ACTIVITY: As tolerated   DISCHARGE FOLLOW-UP Follow-up Information    Follow up with NADEL,SCOTT M, MD. Schedule an appointment as soon as possible for a visit  in 10 days.      Follow up with Gates Rigg, MD. Schedule an appointment as soon as possible for a visit in 6 weeks.   Contact information:   [address], [address] [facility] [place] [address] [phone]       Follow up with [facility] (Office will call you to set up Holter Monitor)    Contact information:   [address], [address] [place] [address] [phone]         Time spent on Discharge: 45 minutes  Signed:  Thessaly Mccullers M.D. [facility] 07/14/2011, 5:45 PM

## 2011-07-14 NOTE — Progress Notes (Signed)
Stroke Team Progress Note  HISTORY [name] is an 71 y.o. female who reports that she was talking with her sister on the phone this morning at about 1030 and had acute onset of a left temporal headache. The patient dose not usually get headaches and soon noted that she knew what she wanted to say but was uable to get the words out. Then noted RLE weakness. Did not note any upper extremity abnormalities and noted no sensory problems. Patient presented to ED at that time and reports that by 2pm her symptoms had resolved. Patient feels she is back to baseline at this time. She did not receive TPA secondary to resolution of symptoms. She was admitted for further evaluation and treatment.  SUBJECTIVE No family is at the bedside. Overall she feels her condition is gradually improving. She had a severe headache after the symptoms yesterday, still present. No history of migraine.  OBJECTIVE Most recent Vital Signs: Temp: 97.8 F (36.6 C) (01/24 1035) Temp src: Oral (01/24 1035) BP: 124/70 mmHg (01/24 1035) Pulse Rate: 54  (01/24 1035) Respiratory Rate: 18 O2 Saturation: 94%  CBG (last 3) No results found for this basename: GLUCAP:3 in the last 72 hours Intake/Output from previous day:  IV Fluid Intake:    Medications    . ALPRAZolam  1 mg Oral QHS  . calcium-vitamin D  1 tablet Oral Daily  . clopidogrel  75 mg Oral Q breakfast  . furosemide  20 mg Oral Daily  . multivitamin  1 capsule Oral Daily  . potassium chloride SA  20 mEq Oral BID  . DISCONTD: rosuvastatin  5 mg Oral q1800  PRN acetaminophen, acetaminophen, ALPRAZolam, fluticasone, ondansetron (ZOFRAN) IV, ondansetron  Diet:  Cardiac thin liquids Activity:  Bathroom privileges DVT Prophylaxis:  SCDs   Significant Diagnostic Studies: CBC    Component Value Date/Time   WBC 3.7* 07/14/2011 0532   RBC 3.61* 07/14/2011 0532   HGB 12.8 07/14/2011 0532   HCT 36.7 07/14/2011 0532   PLT 124* 07/14/2011 0532   MCV 101.7* 07/14/2011  0532   MCH 35.5* 07/14/2011 0532   MCHC 34.9 07/14/2011 0532   RDW 14.9 07/14/2011 0532   LYMPHSABS 1.3 07/05/2011 1257   MONOABS 0.2 07/05/2011 1257   EOSABS 0.1 07/05/2011 1257   BASOSABS 0.0 07/05/2011 1257   CMP    Component Value Date/Time   NA 140 07/14/2011 0532   K 4.0 07/14/2011 0532   CL 105 07/14/2011 0532   CO2 28 07/14/2011 0532   GLUCOSE 110* 07/14/2011 0532   BUN 13 07/14/2011 0532   CREATININE 0.78 07/14/2011 0532   CALCIUM 9.2 07/14/2011 0532   PROT 6.5 07/14/2011 0532   ALBUMIN 3.5 07/14/2011 0532   AST 41* 07/14/2011 0532   ALT 33 07/14/2011 0532   ALKPHOS 117 07/14/2011 0532   BILITOT 0.8 07/14/2011 0532   GFRNONAA 83* 07/14/2011 0532   GFRAA >90 07/14/2011 0532   COAGS Lab Results  Component Value Date   INR 1.0 RATIO 04/28/2008   Lipid Panel    Component Value Date/Time   CHOL 238* 07/14/2011 0532   TRIG 320* 07/14/2011 0532   HDL 29* 07/14/2011 0532   CHOLHDL 8.2 07/14/2011 0532   VLDL 64* 07/14/2011 0532   LDLCALC 145* 07/14/2011 0532   HgbA1C  Lab Results  Component Value Date   HGBA1C 6.0* 07/13/2011   Urine Drug Screen  No results found for this basename: labopia, cocainscrnur, labbenz, amphetmu, thcu, labbarb  Alcohol Level No results found for this basename: eth   CT of the brain   No evidence of acute intracranial abnormality.  Small vessel ischemic changes.   MRI of the brain    Mild chronic microvascular ischemia in the white matter.  No acute abnormality.   MRA of the brain  No significant intracranial abnormality.  Normal variant anatomy in the circle of Willis.  2D Echocardiogram  ordered   Carotid Doppler  No internal carotid artery stenosis bilaterally. Vertebrals with antegrade flow bilaterally.   CXR   No active cardiopulmonary disease.   EKG  normal sinus rhythm, borderline left axis deviation.   Physical Exam   Pleasant middle-aged Caucasian lady currently not in distress. Afebrile. Head is nontraumatic. Neck is supple without bruit.  Cardiac exam the murmur or gallop. Lungs are clear to auscultation. Distal pulses are felt.  Neurological exam Awake  Alert oriented x 3. Normal speech and language.eye movements full without nystagmus. Face symmetric. Tongue midline. Normal strength, tone, reflexes and coordination. Normal sensation. Gait deferred.  ASSESSMENT [name] is a 71 y.o. female with sudden onset right hemiparesis wtih left brain TIA, secondary to unknown etiology. Severe headache following symptoms; no history of migraine. Likely, given symptoms, an embolic source. On aspirin 325 mg orally every day prior to admission, changed to plavix in hospital for secondary stroke prevention.  Fibromyalgia  Dyslipidemia. Has been intolerant to zocor, crestor, zetia in the past due to pain.  Hospital day # 1  TREATMENT/PLAN - Continue clopidogrel 75 mg orally every day for secondary stroke prevention.  - follow up 2D echo - Add lipitor 20 mg daily. Give Co-Q-10 in conjunction with lipitor at discharge to lessen side effects. - Please schedule outpatient telemetry monitoring to assess patient for atrial fibrillation as source of stroke. May be arranged with patient's cardiologist, or cardiologist of choice.    Joaquin Music, ANP-BC, GNP-BC [facility] Pager: [phone] 07/14/2011 10:51 AM  Dr. Delia Heady, Stroke Center Medical Director, has personally reviewed chart, pertinent data, examined the patient and developed the plan of care.

## 2011-07-14 NOTE — Progress Notes (Signed)
VASCULAR LAB PRELIMINARY  PRELIMINARY  PRELIMINARY  PRELIMINARY  No significant ICA stenosis bilaterally. Vertebral arteries are patent with antegrade.      [name], 07/14/2011, 9:46 AM

## 2011-07-14 NOTE — Progress Notes (Signed)
Utilization Review Completed.Katria Botts T12/09/2011   

## 2011-07-14 NOTE — Progress Notes (Signed)
Physical Therapy Evaluation Patient Details Name: [name] MRN: [id number] DOB: Dec 24, 1940 Today's Date: 07/14/2011  Problem List:  Patient Active Problem List  Diagnoses  . HYPERLIPIDEMIA  . OBESITY  . ANXIETY  . HYPERTENSION  . CORONARY ARTERY DISEASE  . DIVERTICULOSIS OF COLON  . FATTY LIVER DISEASE  . LOW BACK PAIN, CHRONIC  . FIBROMYALGIA  . OSTEOPENIA  . SYNCOPE  . DIZZINESS  . TIA (transient ischemic attack)    Past Medical History:  Past Medical History  Diagnosis Date  . Unspecified essential hypertension   . CAD (coronary artery disease)   . Other and unspecified hyperlipidemia   . Obesity, unspecified   . Diverticulosis of colon (without mention of hemorrhage)   . Other chronic nonalcoholic liver disease   . Myalgia and myositis, unspecified   . Disorder of bone and cartilage, unspecified   . Dizziness and giddiness   . Anxiety state, unspecified    Past Surgical History:  Past Surgical History  Procedure Date  . Tonsillectomy and adenoidectomy   . Total abdominal hysterectomy w/ bilateral salpingoophorectomy   . Cholecystectomy   . Back surgery   . Mohs surgery     PT Assessment/Plan/Recommendation PT Assessment Clinical Impression Statement: Pt presents with a medical diagnosis of TIA. Pt is at her baseline level for all functional mobility. Pt is safe to d/c home. Pt was educated on stroke warning signs and risk factors. Will not follow pt acutely. Pt encouraged to ask for assistance if needed PT Recommendation/Assessment: Patent does not need any further PT services No Skilled PT: All education completed;Patient at baseline level of functioning PT Recommendation Follow Up Recommendations: No PT follow up Equipment Recommended: None recommended by PT PT Goals     PT Evaluation Precautions/Restrictions  Precautions Precautions: Fall Restrictions Weight Bearing Restrictions: No Prior Functioning  Home Living Lives With:  Spouse Receives Help From: Family Type of Home: House Home Layout: One level Home Access: Stairs to enter Entrance Stairs-Rails: None Entrance Stairs-Number of Steps: 2 Bathroom Shower/Tub: Tub/shower unit;Walk-in shower;Door;Curtain (can use either) Bathroom Toilet: Handicapped height Bathroom Accessibility: Yes How Accessible: Accessible via walker Home Adaptive Equipment: Walker - rolling;Straight cane;Crutches Prior Function Level of Independence: Independent with basic ADLs;Independent with homemaking with ambulation;Independent with gait;Independent with transfers Able to Take Stairs?: Yes Driving: Yes Vocation: Retired Producer, television/film/video: Awake/alert Overall Cognitive Status: Appears within functional limits for tasks assessed Orientation Level: Oriented X4 Sensation/Coordination Sensation Light Touch: Appears Intact Extremity Assessment RLE Assessment RLE Assessment: Within Functional Limits (MMT 5/5) LLE Assessment LLE Assessment: Within Functional Limits (MMT 5/5) Mobility (including Balance) Bed Mobility Bed Mobility: Yes Supine to Sit: 6: Modified independent (Device/Increase time) Sitting - Scoot to Edge of Bed: 6: Modified independent (Device/Increase time) Transfers Transfers: Yes Sit to Stand: 5: Supervision Sit to Stand Details (indicate cue type and reason): VC for hand placement for safety. Supervision secondary to safety concerns Stand to Sit: 5: Supervision Stand to Sit Details: VC for hand placement.  Ambulation/Gait Ambulation/Gait: Yes Ambulation/Gait Assistance: 4: Min assist (Minguard assist) Ambulation/Gait Assistance Details (indicate cue type and reason): Minguard assist secondary to safety concerns. Supervision for end of ambulation Ambulation Distance (Feet): 150 Feet Assistive device: None Gait Pattern: Within Functional Limits Gait velocity: Normal gait speed Stairs: Yes Stairs Assistance: 5: Supervision Stairs  Assistance Details (indicate cue type and reason): VC for proper sequencing. Pt able to complete without physical assist Stair Management Technique: No rails;Forwards Number of Stairs: 2  Exercise    End of Session PT - End of Session Equipment Utilized During Treatment: Gait belt Activity Tolerance: Patient tolerated treatment well Patient left: in bed;with call bell in reach;with family/visitor present Nurse Communication: Mobility status for transfers;Mobility status for ambulation General Behavior During Session: [facility] for tasks performed Cognition: [facility] for tasks performed  Milana Kidney 07/14/2011, 3:22 PM

## 2011-07-14 NOTE — Progress Notes (Signed)
*  PRELIMINARY RESULTS* Echocardiogram 2D Echocardiogram has been performed.  [name] [facility] 07/14/2011, 11:35 AM

## 2011-07-18 ENCOUNTER — Ambulatory Visit (INDEPENDENT_AMBULATORY_CARE_PROVIDER_SITE_OTHER): Payer: Medicare Other | Admitting: Pulmonary Disease

## 2011-07-18 ENCOUNTER — Encounter: Payer: Self-pay | Admitting: Pulmonary Disease

## 2011-07-18 DIAGNOSIS — F411 Generalized anxiety disorder: Secondary | ICD-10-CM

## 2011-07-18 DIAGNOSIS — E785 Hyperlipidemia, unspecified: Secondary | ICD-10-CM

## 2011-07-18 DIAGNOSIS — R42 Dizziness and giddiness: Secondary | ICD-10-CM

## 2011-07-18 DIAGNOSIS — G459 Transient cerebral ischemic attack, unspecified: Secondary | ICD-10-CM

## 2011-07-18 DIAGNOSIS — I2510 Atherosclerotic heart disease of native coronary artery without angina pectoris: Secondary | ICD-10-CM

## 2011-07-18 DIAGNOSIS — I10 Essential (primary) hypertension: Secondary | ICD-10-CM

## 2011-07-18 DIAGNOSIS — E669 Obesity, unspecified: Secondary | ICD-10-CM

## 2011-07-18 DIAGNOSIS — K7689 Other specified diseases of liver: Secondary | ICD-10-CM

## 2011-07-18 MED ORDER — CLOPIDOGREL BISULFATE 75 MG PO TABS: 75.0000 mg | ORAL_TABLET | Freq: Every day | ORAL | Status: AC

## 2011-07-18 MED ORDER — ATORVASTATIN CALCIUM 20 MG PO TABS: 20.0000 mg | ORAL_TABLET | Freq: Every day | ORAL | Status: DC

## 2011-07-18 NOTE — Patient Instructions (Signed)
Today we updated your med list in our EPIC system...    Continue your current medications the same...  We will call in refills for the Plavix & Lipitor started during the recent hosp...  We will arrange for a 30d Holter monitor to check for any cardiac arrhythmia (esp AFib) that could have been causally related to your TIA.  Be sure to call DrSethi's office for a follow up appt in about 6 weeks, the holter report should be avail by then...  Let's plan a follow up recheck in about 3 months & come FASTING for a follow up Lipid profile.Marland KitchenMarland Kitchen

## 2011-07-18 NOTE — Progress Notes (Signed)
Subjective:    Patient ID: [name], female    DOB: 10-07-1940, 71 y.o.   MRN: [id number]  HPI 71 y/o WF here for a follow up visit... she has mult med problems as noted below... Followed for general medical purposes w/ hx HBP, CAD & hx Syncope followed by DrTaylor, Hyperlipidemia, Obesity, Fatty Liver disease, LBP/ FM/ Osteopenia, & anxiety...  ~  December 14, 2009:  she reports a good 6 months- no new complaints or concerns... BP controlled on Lasix & she denies CP, palpit, SOB, edema, etc... she notes dizziness if she tilts her head back "it's cardio-depressor dizziness" she says- no syncope... she decided to re-start her Simva80 & never went to the Lipid Clinic> FLP improved on this but LFT's elevated (FLD) & she will need assessment in the clinic + f/u w/ GI.Marland Kitchen.  ~  June 15, 2010:  20mo ROV- she seems conflicted> told the nurse she has no energy & tired all the time; tells me she feels good "best in 53yrs"... she stopped her Simva80 on her own & went to [facility] 7/11 but didn't return 8/11 as requested> FLP today shows LDL=212 & she is encouraged to ret to the clinic for help... BP stable & she denies CP, palpit, edema, etc;  requesting diff nasal spray & we will try Omnaris.  ~  February 02, 2011:  71mo ROV & she continues to do well, feeling good she says w/o new complaints or concerns x heel pain (we discussed warm soaks & stretching exercises) & insomnia (taking 2 Xanax Qhs w/o benefit)- we discussed trial RESTORIL 30mg  Qhs...     She notes BP well controlled; denies CP, palpit, SOB, edema, or cerebral ischemic symptoms; and no further dizziness from her "carotid sinus sensitivity" per DrTaylor;  She did the the Pharmacists in the Lipid Clinic & was tried on Crestor 5mg  but INTOL she says & "they released me back to your care"> we discussed trial of non-statin rx ZETIA 10mg /d along w/ better diet, exercise & wt reduction...  ~  July 05, 2011:  71mo ROV & she is c/o dry cough, & a sl sore throat  x1d for which she would like a prescription for MMW; also notes several skin cancers removed by Derm & she has f/u appt soon...    >HBP controlled w/ Lasix20 + K20Bid; BP= 146/92 here now but better at home she says& she is reminded to elim sodium, get on diet & get wt down...     >Cards followed by DrTaylor for nonobstructive CAD, DD, Hx syncope & carotid sinus hypersensitivity per DrTaylor; on ASA325mg /d...    >She has severe hyperlipidemia & has been INTOL to all meds; last tried Zetia but she stopped this too; she notes that the Lipid clinic gave up on her and "they released me" so now she is on diet alone + fishOil "I'll do the best I can", and she notes "It's hereditary per DrTaylor"; see FLP results below>>    >Obesity is a constant battle; weight= 210#, 63" tall, BMI=37-38; we reviewed low carb low fat weight reducing diet & exercise program...    >Ortho> FM, LBP, Osteopenia, etc...    >Anxiety on Xanax prn...  ~  July 18, 2011:  2wk ROv & post hospital visit> Extensive hosp records reviewed> she had a TIA 07/13/11 characterized by sudden left sided HA, blurry vision, right leg weakness, slurring of speech & inability to find words> all signs/ symptoms cleared over a 2-3H period; eval  in ER & short admission by [facility] w/ Neuro/ Stroke Team consult revealed the following>>    >CXR (clear, NAD) & LABS (all essent wnl x FLP as below)...    >CT Brain w/ sm vessel dis, NAD...    >MRI/ MRA Brain: neg for acute infarct, mild chr microvasc ischemia, normal variant anatomy of the circle of willis, no signif intracranial abn...    >CDopplers were neg- no signif ICA stenoses...    >2DEcho revealed mild LVH, norm wall motion w/o regional wall motion abn, EF=60%, no cardiac source of emboli apparent... DrSethi changed her ASA325/d to PLAVIX 75mg /d & wanted her to have an outpt Holter Monitor to r/o PAF (recall Hx below of syncope in 2007 w/ neg 53mo implanted loop recorder); they also started pt on  Lipitor20mg  even though she was supposedly INTOL to all statins + Zetia as noted below;  We will call in 90d supplies of her Plavix & Lipitor, she will continue on the CoQ10 OTC... She has not had any additional or recurrent problems since disch...          Problem List:    ALLERGIC RHINITIS - she states that saline nasal spray dries her eyes out & wants diff inhaler> rec OMNARIS 2sp Qhs...  HYPERTENSION (ICD-401.9) - off Norvasc & Diltiazem, on LASIX 20mg /d, K56mEqBid... ~  8/12: BP=136/92 =>130/80 after rest today and she denies HA, fatigue, visual changes, CP, dyspnea, edema, etc; she notes occas palpitations...  ~  1/13: BP= 146/92 but she notes better at home & she is reminded to elim sodium, get on diet & get weight down... ~  1/13 post-hosp: BP= 132/84 & she feels back to baseline...  CORONARY ARTERY DISEASE (ICD-414.00) - hx non-obstructive CAD followed by DrTaylor Antony Haste... she denies CP, palpit, ch in SOB, edema, etc... ~  cath 1996 showed 20-30% lesions in the Circ & RCA, EF=60%... ~  NuclearStressTest 10/07 showed no scar or ischemia, ?EF=37%?... ~  2DEcho 10/07 showed no regional wall motion changes, EF=60-65%... valves OK, some DD noted. ~  repeat cath 12/09 showed similar findings w/ nonobstructive plaque, EF= 65%... ~  1/13:  2DEcho showed mild LVH, norm wall motion w/o regional wall motion abn, EF=60%, no cardiac source of emboli apparent...  Hx of SYNCOPE (ICD-780.2) - hosp in 2007 for syncope and no etiology discovered... implanted loop recorder placed for 18 months w/o events, then 2 spells after the recorder was removed!!!... rehosp 4/08 w/ neg studies at that time too... repeat eval 11/09 & nothing found... Dx w/ Carotid Sinus Hypersensitivity by DrTaylor... ~  CDopplers 10/07 showed mild plaque, 0-39% bilat ICA... ~  neg TiltTable study 4/08... ~  6/10: notes occas palpit, dizzy- uses Meclizine Prn... saw DrTaylor 7/10- rec diet + exerc. ~  No further syncope or  near-syncope by her report... ~  CDopplers 1/13 showed neg- no signif ICA stenoses...  HYPERLIPIDEMIA (ICD-272.4) - she has proven INTOL to all meds & was "released" from the [facility]id[facility]inic> we discussed diet + exercise again... ~  FLP 3/09 showed TChol 139, TG 74, HDL 41, LDL 83... continue med + diet therapy...  ~  FLP 11/09 showed TChol 99, TG 74, HDL 30, LDL 54 ~  FLP 6/10 on Simva80 showed TChol 141, TG 90, HDL 34, LDL 89... note: LFT's sl elevated... ~  FLP 12/10 off Simva showed TChol 279, TG 178, HDL 33, LDL 208... rec> refer to Lipid Clinic (she never went). ~  FLP 6/11 back on Simva80 showed TChol  154, TG 107, HDL 37, LDL 96... needs LC eval to ch med. ~  FLP 12/11 off meds showed TChol 270, TG 145, HDL 39, LDL 213... rec to return to [facility] for on-going care. ~  Lipid Clinic tried Crestor 5mg  but she states INTOL & "they released me" according to the pt... ~  8/12:  Try Zetia 10mg /d along w/ better diet, exercise, wt reduction => she states INTOL to Zetia as well... ~  1/13: FLP on diet alone showed TChol 234, TG 147, HDL 37, LDL 163... She will continue to work on diet & exercise... ~  1/13 post hosp:  DrSethi started pt on LIPITOR 20mg /d + CoQ10 100mg /d after her TIA "so far so good"  OBESITY (ICD-278.00) - We discussed diet + exercise program... she states that DrOz says you can gain weight while hardly eating any food & she thinks she isn't eating enough food...  ~  weight 12/09 = 212# ~  weight 6/10 = 219# ~  weight 12/10 = 217# ~  weight 6/11 = 215# ~  weight 12/11 = 216#.Marland Kitchen we discussed diet + exercise. ~  Weight 8/12 = 213# ~  Weight 1/13 = 210#  DIVERTICULOSIS OF COLON (ICD-562.10) - she needs f/u eval DrDBrodie- ?when was last colonoscopy? ~  CT Abd 9/10  done for Abd pain- showed divertics & hep steatosis, retained stool & DJD sp...  FATTY LIVER DISEASE (ICD-571.8) - hx elevated LFT's x yrs and eval DrDBrodie in past w/ rec to diet/ exerc/ weight reduction... ~  labs 6/10  showed SGOT= 89, SGPT= 65 ~  labs 12/10 showed SGOT= 65, SGPT= 54 ~  labs 6/11 showed SGOT= 119, SGPT= 76... needs re-eval by GI ~  labs 12/11 showed SGOT= 53, SGPT= 44 ~  Labs 1/13 showed SGOT= 53, SGPT= 41  LOW BACK PAIN, CHRONIC (ICD-724.2) - s/p lumbar surg in 1989 by DrBotero...  FIBROMYALGIA (ICD-729.1)  OSTEOPENIA (ICD-733.90) - ?prev BMD from GYN... on Calcium, MVI, VitD... ~  BMD here 3/09 showed TScores -0.7 in Spine, and -1.6 in left FemNeck ~  6/11:  f/u BMD showed TScores -0.7 in Spine, and -1.1 in right FemNeck  DIZZINESS (ICD-780.4) - she takes MECLIZINE 25mg  Bid... ~  s/p ENT eval by [facility] 2/08 felt to be benign positional vertigo, Rx w/ meclizine...  ~  she cancelled a planned neuro evaluation 4/08...  TIA >> SEE ABOVE in-patient eval 1/13 by [facility] & Stroke team... ASA stopped & pt placed on PLAVIX 75mg /d...  ANXIETY (ICD-300.00) - on XANAX 0.5mg Tid prn... ~  8/12:  She is c/o insomnia & the Xanax doesn't help she said; rec trial Restoril 30mg Qhs prn...  SKIN CANCER - s/o Moh's surg for BCCa left cheek...  HEALTH MAINT--- GYN= prev DrFore and needs new Gyn now... mamm[facility]... had neg FlexSig 1991 DrBrodie and needs routine colonoscopy scheduled... states she uses olive oil on her dry skin per DrOz... had Pneumovax 2008, & 2010 Flu shot in Oct.   Current Medications, Allergies, Past Medical History, Past Surgical History, Family History, and Social History were reviewed in [employer] record.   Past Surgical History  Procedure Date  . Tonsillectomy and adenoidectomy   . Total abdominal hysterectomy w/ bilateral salpingoophorectomy   . Cholecystectomy   . Back surgery   . Mohs surgery     Outpatient Encounter Prescriptions as of 07/18/2011  Medication Sig Dispense Refill  . ALPRAZolam (XANAX) 0.5 MG tablet Take 0.25-0.5 mg by mouth 3 (  three) times daily as needed. For anxiety      . atorvastatin (LIPITOR) 20 MG tablet Take  1 tablet (20 mg total) by mouth daily.  90 tablet  3  . Calcium Carbonate-Vitamin D (CALTRATE 600+D) 600-400 MG-UNIT per tablet Take 1 tablet by mouth daily.        . clopidogrel (PLAVIX) 75 MG tablet Take 1 tablet (75 mg total) by mouth daily with breakfast.  90 tablet  3  . Coenzyme Q10 (CO Q 10) 100 MG CAPS Take 1 capsule by mouth daily.  30 capsule  1  . fluticasone (CUTIVATE) 0.05 % cream Apply 1 application topically as needed. Apply to toe      . furosemide (LASIX) 20 MG tablet Take 20 mg by mouth daily.        . Multiple Vitamin (MULTIVITAMIN) capsule Take 1 capsule by mouth daily.        . potassium chloride SA (K-DUR,KLOR-CON) 20 MEQ tablet Take 20 mEq by mouth 2 (two) times daily.      Marland Kitchen DISCONTD: atorvastatin (LIPITOR) 20 MG tablet Take 1 tablet (20 mg total) by mouth daily.  30 tablet  1  . DISCONTD: clopidogrel (PLAVIX) 75 MG tablet Take 1 tablet (75 mg total) by mouth daily with breakfast.  60 tablet  3    Allergies  Allergen Reactions  . Codeine     REACTION: nausea  . Diltiazem Hcl     REACTION: pt states it made her dizzy...  . Sulfonamide Derivatives     REACTION: red/splotchy hives    Current Medications, Allergies, Past Medical History, Past Surgical History, Family History, and Social History were reviewed in [employer] record.    Review of Systems       See HPI - all other systems neg except as noted...       The patient complains of dyspnea on exertion.  The patient denies anorexia, fever, weight loss, weight gain, vision loss, decreased hearing, hoarseness, chest pain, syncope, peripheral edema, prolonged cough, headaches, hemoptysis, abdominal pain, melena, hematochezia, severe indigestion/heartburn, hematuria, incontinence, muscle weakness, suspicious skin lesions, transient blindness, difficulty walking, depression, unusual weight change, abnormal bleeding, enlarged lymph nodes, and angioedema.     Objective:   Physical Exam      WD, Obese, 71 y/o WF in NAD... GENERAL:  Alert & oriented; pleasant & cooperative... HEENT:  [place]/AT, EOM-wnl,  PERRLA, EACs- wax, TMs-wnl, NOSE-pale, THROAT-clear & wnl. NECK:  Supple w/ fairROM; no JVD; normal carotid impulses w/o bruits; no thyromegaly or nodules palpated; no lymphadenopathy. CHEST:  Clear BS bilaterally without wheezing, rales, or signs of consolidation... HEART:  Regular Rhythm; without murmurs/ rubs/ or gallops heard... ABDOMEN:  Soft & nontender; normal bowel sounds; no organomegaly or masses detected. EXT: without deformities, mild arthritic changes; no varicose veins/ +venous insuffic/ tr edema... NEURO:  CN's intact, no focal neuro deficits... DERM:  scar on left cheek, no lesions noted...  RADIOLOGY DATA:  Reviewed in the EPIC EMR & discussed w/ the patient...  LABORATORY DATA:  Reviewed in the EPIC EMR & discussed w/ the patient...   Assessment & Plan:   TIA>>  See above work up by [facility] & Stroke Team; no source of the TIA found & pt was switched from ASA 325mg /d to PLAVIX 75mg /d; they have requested [place] to set up outpt HOLTER monitor to r/o PAF as source...  HBP>  Control is fair on LASIX;  diet w/ weight reduction stressed to pt or we  may need to add meds.  CAD>  Followed by DrTaylor for Cards now on PLAVIX; denies angina etc but she is too sedentary & needs incr exercise etc...  Hx Syncope>  Extensive eval by Cards, DrTaylor- dx w/ carotid sinus hypersensitivity; no recurrent episodes she says; she uses Meclizine prn dizziness...  HYPERLIPID>  A signif problem for her but INTOL to all statins and the Lipid Clinic has "released her" she says; she was also intol to Zetia & she has decided to forgo med rx & concentrate on diet alone>> then she had the TIA & DrSethi insisted on Lipitor20mg  which she is currently on & tolerating ok...  OBESITY>  We reviewed the needed diet, exercise & wt reduction programs...  GI> Divertics>  She needs f/u DrDBrodie & consideration  of f/u colonoscopy...  Fatty Liver Disease>  Hx elev LFTs and CT Abd 9/10 w/ hepatic steatosis; we discussed the need for weight reduction...  ORTHO> LBP, FM, Osteopenia>  Prev lumbar surg from DrBotero, she uses OTC analgesics, calcium, MVI...  Anxiety>  On Xanax as needed & we will give her a trial of RESTORIL 30mg  Qhs for insomnia...   Patient's Medications  New Prescriptions   No medications on file  Previous Medications   ALPRAZOLAM (XANAX) 0.5 MG TABLET    Take 0.25-0.5 mg by mouth 3 (three) times daily as needed. For anxiety   CALCIUM CARBONATE-VITAMIN D (CALTRATE 600+D) 600-400 MG-UNIT PER TABLET    Take 1 tablet by mouth daily.     COENZYME Q10 (CO Q 10) 100 MG CAPS    Take 1 capsule by mouth daily.   FLUTICASONE (CUTIVATE) 0.05 % CREAM    Apply 1 application topically as needed. Apply to toe   FUROSEMIDE (LASIX) 20 MG TABLET    Take 20 mg by mouth daily.     MULTIPLE VITAMIN (MULTIVITAMIN) CAPSULE    Take 1 capsule by mouth daily.     POTASSIUM CHLORIDE SA (K-DUR,KLOR-CON) 20 MEQ TABLET    Take 20 mEq by mouth 2 (two) times daily.  Modified Medications   Modified Medication Previous Medication   ATORVASTATIN (LIPITOR) 20 MG TABLET atorvastatin (LIPITOR) 20 MG tablet      Take 1 tablet (20 mg total) by mouth daily.    Take 1 tablet (20 mg total) by mouth daily.   CLOPIDOGREL (PLAVIX) 75 MG TABLET clopidogrel (PLAVIX) 75 MG tablet      Take 1 tablet (75 mg total) by mouth daily with breakfast.    Take 1 tablet (75 mg total) by mouth daily with breakfast.  Discontinued Medications   No medications on file

## 2011-07-19 ENCOUNTER — Telehealth: Payer: Self-pay | Admitting: Pulmonary Disease

## 2011-07-19 MED ORDER — PANTOPRAZOLE SODIUM 40 MG PO TBEC: 40.0000 mg | DELAYED_RELEASE_TABLET | Freq: Every day | ORAL | Status: DC

## 2011-07-19 MED ORDER — FAMOTIDINE 20 MG PO TABS: 20.0000 mg | ORAL_TABLET | Freq: Every day | ORAL | Status: DC

## 2011-07-19 NOTE — Telephone Encounter (Signed)
Per SN---we can add on protonix 40mg   1 daily  30 min before a meal daily  And pepcid 20mg   1 tablet by mouth qhs.  Called and spoke with pt and she is aware of SN recs.  She requested that these meds be [facility].

## 2011-07-19 NOTE — Progress Notes (Signed)
Addended by: Marcellus Scott on: 07/19/2011 04:29 PM   Modules accepted: Orders

## 2011-07-19 NOTE — Telephone Encounter (Signed)
Spoke with pt. She states was recently started on new meds- co q10, plavix, and lipitor. She states that last night started having pain "from my esophagus down" she states that the pain reminds her of when she had labor pain before. BM's have been normal " but painful" no nausea, vomiting. She states that she also had episode of painful urination last night. Would like to know if any of these meds could be causing this. Please advise, thanks!

## 2011-07-20 ENCOUNTER — Other Ambulatory Visit: Payer: Self-pay | Admitting: Pulmonary Disease

## 2011-07-20 DIAGNOSIS — G459 Transient cerebral ischemic attack, unspecified: Secondary | ICD-10-CM

## 2011-07-25 ENCOUNTER — Encounter: Payer: Self-pay | Admitting: Adult Health

## 2011-07-25 ENCOUNTER — Ambulatory Visit (INDEPENDENT_AMBULATORY_CARE_PROVIDER_SITE_OTHER): Payer: Medicare Other | Admitting: Adult Health

## 2011-07-25 ENCOUNTER — Encounter: Payer: Self-pay | Admitting: Internal Medicine

## 2011-07-25 ENCOUNTER — Other Ambulatory Visit (INDEPENDENT_AMBULATORY_CARE_PROVIDER_SITE_OTHER): Payer: Medicare Other

## 2011-07-25 DIAGNOSIS — R109 Unspecified abdominal pain: Secondary | ICD-10-CM

## 2011-07-25 LAB — URINALYSIS
Ketones, ur: NEGATIVE
Leukocytes, UA: NEGATIVE
Specific Gravity, Urine: 1.01 (ref 1.000–1.030)
Urobilinogen, UA: 0.2 (ref 0.0–1.0)

## 2011-07-25 LAB — CBC WITH DIFFERENTIAL/PLATELET
Basophils Absolute: 0 10*3/uL (ref 0.0–0.1)
Basophils Relative: 0.5 % (ref 0.0–3.0)
Eosinophils Relative: 2.3 % (ref 0.0–5.0)
HCT: 38.4 % (ref 36.0–46.0)
Hemoglobin: 13 g/dL (ref 12.0–15.0)
Lymphocytes Relative: 34.3 % (ref 12.0–46.0)
Monocytes Relative: 5.9 % (ref 3.0–12.0)
Neutro Abs: 2.2 10*3/uL (ref 1.4–7.7)
RBC: 3.59 Mil/uL — ABNORMAL LOW (ref 3.87–5.11)
WBC: 3.9 10*3/uL — ABNORMAL LOW (ref 4.5–10.5)

## 2011-07-25 LAB — COMPREHENSIVE METABOLIC PANEL
ALT: 31 U/L (ref 0–35)
BUN: 14 mg/dL (ref 6–23)
CO2: 30 mEq/L (ref 19–32)
Calcium: 8.8 mg/dL (ref 8.4–10.5)
Chloride: 104 mEq/L (ref 96–112)
Creatinine, Ser: 0.8 mg/dL (ref 0.4–1.2)
GFR: 77.5 mL/min (ref >60.00)
Glucose, Bld: 78 mg/dL (ref 70–99)

## 2011-07-25 NOTE — Patient Instructions (Addendum)
Advance bland diet as tolerated.  Gas X with meals We are referring you to Dr. Juanda Chance for abdominal pain.  We have set you up for a CT abd/pelvis to evaluate your pain.  Please contact office for sooner follow up if symptoms do not improve or worsen or seek emergency care

## 2011-07-25 NOTE — Progress Notes (Signed)
Subjective:    Patient ID: [name], female    DOB: [birth year], 71 y.o.   MRN: [id number]  HPI 71 y/o WF  -mult med problems as noted below... Followed for general medical purposes w/ hx HBP, CAD & hx Syncope followed by DrTaylor, Hyperlipidemia, Obesity, Fatty Liver disease, LBP/ FM/ Osteopenia, & anxiety...  ~  December 14, 2009:  she reports a good 6 months- no new complaints or concerns... BP controlled on Lasix & she denies CP, palpit, SOB, edema, etc... she notes dizziness if she tilts her head back "it's cardio-depressor dizziness" she says- no syncope... she decided to re-start her Simva80 & never went to the Lipid Clinic> FLP improved on this but LFT's elevated (FLD) & she will need assessment in the clinic + f/u w/ GI.Marland Kitchen.  ~  June 15, 2010:  63mo ROV- she seems conflicted> told the nurse she has no energy & tired all the time; tells me she feels good "best in 66yrs"... she stopped her Simva80 on her own & went to [facility] 7/11 but didn't return 8/11 as requested> FLP today shows LDL=212 & she is encouraged to ret to the clinic for help... BP stable & she denies CP, palpit, edema, etc;  requesting diff nasal spray & we will try Omnaris.  ~  February 02, 2011:  68mo ROV & she continues to do well, feeling good she says w/o new complaints or concerns x heel pain (we discussed warm soaks & stretching exercises) & insomnia (taking 2 Xanax Qhs w/o benefit)- we discussed trial RESTORIL 30mg  Qhs...     She notes BP well controlled; denies CP, palpit, SOB, edema, or cerebral ischemic symptoms; and no further dizziness from her "carotid sinus sensitivity" per DrTaylor;  She did the the Pharmacists in the Lipid Clinic & was tried on Crestor 5mg  but INTOL she says & "they released me back to your care"> we discussed trial of non-statin rx ZETIA 10mg /d along w/ better diet, exercise & wt reduction...  ~  July 05, 2011:  68mo ROV & she is c/o dry cough, & a sl sore throat x1d for which she would like a  prescription for MMW; also notes several skin cancers removed by Derm & she has f/u appt soon...    >HBP controlled w/ Lasix20 + K20Bid; BP= 146/92 here now but better at home she says& she is reminded to elim sodium, get on diet & get wt down...     >Cards followed by DrTaylor for nonobstructive CAD, DD, Hx syncope & carotid sinus hypersensitivity per DrTaylor; on ASA325mg /d...    >She has severe hyperlipidemia & has been INTOL to all meds; last tried Zetia but she stopped this too; she notes that the Lipid clinic gave up on her and "they released me" so now she is on diet alone + fishOil "I'll do the best I can", and she notes "It's hereditary per DrTaylor"; see FLP results below>>    >Obesity is a constant battle; weight= 210#, 63" tall, BMI=37-38; we reviewed low carb low fat weight reducing diet & exercise program...    >Ortho> FM, LBP, Osteopenia, etc...    >Anxiety on Xanax prn...  ~  July 18, 2011:  2wk ROv & post hospital visit> Extensive hosp records reviewed> she had a TIA 07/13/11 characterized by sudden left sided HA, blurry vision, right leg weakness, slurring of speech & inability to find words> all signs/ symptoms cleared over a 2-3H period; eval in ER & short admission by [facility]  w/ Neuro/ Stroke Team consult revealed the following>>    >CXR (clear, NAD) & LABS (all essent wnl x FLP as below)...    >CT Brain w/ sm vessel dis, NAD...    >MRI/ MRA Brain: neg for acute infarct, mild chr microvasc ischemia, normal variant anatomy of the circle of willis, no signif intracranial abn...    >CDopplers were neg- no signif ICA stenoses...    >2DEcho revealed mild LVH, norm wall motion w/o regional wall motion abn, EF=60%, no cardiac source of emboli apparent... DrSethi changed her ASA325/d to PLAVIX 75mg /d & wanted her to have an outpt Holter Monitor to r/o PAF (recall Hx below of syncope in 2007 w/ neg 17mo implanted loop recorder); they also started pt on Lipitor20mg  even though she was  supposedly INTOL to all statins + Zetia as noted below;  We will call in 90d supplies of her Plavix & Lipitor, she will continue on the CoQ10 OTC... She has not had any additional or recurrent problems since disch...  07/25/2011 Acute OV  Complains of 2 weeks of intermittent right lower abdominal pain , worse for last 3 days. Hurts when she moves. Throbbing pain at times. No urinary symptoms. No fever . No association with food. No n/v/d. No bloody stools. Pain is severe at times. Very tender and sore along right lower abdomen.  No new meds or travel. Labs today show clear urine . WBC at 3.9 .            Problem List:    ALLERGIC RHINITIS - she states that saline nasal spray dries her eyes out & wants diff inhaler> rec OMNARIS 2sp Qhs...  HYPERTENSION (ICD-401.9) - off Norvasc & Diltiazem, on LASIX 20mg /d, K86mEqBid... ~  8/12: BP=136/92 =>130/80 after rest today and she denies HA, fatigue, visual changes, CP, dyspnea, edema, etc; she notes occas palpitations...  ~  1/13: BP= 146/92 but she notes better at home & she is reminded to elim sodium, get on diet & get weight down... ~  1/13 post-hosp: BP= 132/84 & she feels back to baseline...  CORONARY ARTERY DISEASE (ICD-414.00) - hx non-obstructive CAD followed by DrTaylor Antony Haste... she denies CP, palpit, ch in SOB, edema, etc... ~  cath 1996 showed 20-30% lesions in the Circ & RCA, EF=60%... ~  NuclearStressTest 10/07 showed no scar or ischemia, ?EF=37%?... ~  2DEcho 10/07 showed no regional wall motion changes, EF=60-65%... valves OK, some DD noted. ~  repeat cath 12/09 showed similar findings w/ nonobstructive plaque, EF= 65%... ~  1/13:  2DEcho showed mild LVH, norm wall motion w/o regional wall motion abn, EF=60%, no cardiac source of emboli apparent...  Hx of SYNCOPE (ICD-780.2) - hosp in 2007 for syncope and no etiology discovered... implanted loop recorder placed for 18 months w/o events, then 2 spells after the recorder was removed!!!...  rehosp 4/08 w/ neg studies at that time too... repeat eval 11/09 & nothing found... Dx w/ Carotid Sinus Hypersensitivity by DrTaylor... ~  CDopplers 10/07 showed mild plaque, 0-39% bilat ICA... ~  neg TiltTable study 4/08... ~  6/10: notes occas palpit, dizzy- uses Meclizine Prn... saw DrTaylor 7/10- rec diet + exerc. ~  No further syncope or near-syncope by her report... ~  CDopplers 1/13 showed neg- no signif ICA stenoses...  HYPERLIPIDEMIA (ICD-272.4) - she has proven INTOL to all meds & was "released" from the LipidClinic> we discussed diet + exercise again... ~  FLP 3/09 showed TChol 139, TG 74, HDL 41, LDL 83... continue med +  diet therapy...  ~  FLP 11/09 showed TChol 99, TG 74, HDL 30, LDL 54 ~  FLP 6/10 on Simva80 showed TChol 141, TG 90, HDL 34, LDL 89... note: LFT's sl elevated... ~  FLP 12/10 off Simva showed TChol 279, TG 178, HDL 33, LDL 208... rec> refer to Lipid Clinic (she never went). ~  FLP 6/11 back on Simva80 showed TChol 154, TG 107, HDL 37, LDL 96... needs LC eval to ch med. ~  FLP 12/11 off meds showed TChol 270, TG 145, HDL 39, LDL 213... rec to [facility] to [facility] for on-going care. ~  Lipid Clinic tried Crestor 5mg  but she states INTOL & "they released me" according to the pt... ~  8/12:  Try Zetia 10mg /d along w/ better diet, exercise, wt reduction => she states INTOL to Zetia as well... ~  1/13: FLP on diet alone showed TChol 234, TG 147, HDL 37, LDL 163... She will continue to work on diet & exercise... ~  1/13 post hosp:  DrSethi started pt on LIPITOR 20mg /d + CoQ10 100mg /d after her TIA "so far so good"  OBESITY (ICD-278.00) - We discussed diet + exercise program... she states that DrOz says you can gain weight while hardly eating any food & she thinks she isn't eating enough food...  ~  weight 12/09 = 212# ~  weight 6/10 = 219# ~  weight 12/10 = 217# ~  weight 6/11 = 215# ~  weight 12/11 = 216#.Marland Kitchen we discussed diet + exercise. ~  Weight 8/12 = 213# ~  Weight 1/13 =  210#  DIVERTICULOSIS OF COLON (ICD-562.10) - she needs f/u eval DrDBrodie- ?when was last colonoscopy? ~  CT Abd 9/10  done for Abd pain- showed divertics & hep steatosis, retained stool & DJD sp...  FATTY LIVER DISEASE (ICD-571.8) - hx elevated LFT's x yrs and eval DrDBrodie in past w/ rec to diet/ exerc/ weight reduction... ~  labs 6/10 showed SGOT= 89, SGPT= 65 ~  labs 12/10 showed SGOT= 65, SGPT= 54 ~  labs 6/11 showed SGOT= 119, SGPT= 76... needs re-eval by GI ~  labs 12/11 showed SGOT= 53, SGPT= 44 ~  Labs 1/13 showed SGOT= 53, SGPT= 41  LOW BACK PAIN, CHRONIC (ICD-724.2) - s/p lumbar surg in 1989 by DrBotero...  FIBROMYALGIA (ICD-729.1)  OSTEOPENIA (ICD-733.90) - ?prev BMD from GYN... on Calcium, MVI, VitD... ~  BMD here 3/09 showed TScores -0.7 in Spine, and -1.6 in left FemNeck ~  6/11:  f/u BMD showed TScores -0.7 in Spine, and -1.1 in right FemNeck  DIZZINESS (ICD-780.4) - she takes MECLIZINE 25mg  Bid... ~  s/p ENT eval by [facility]la [facility]us 2/08 felt to be benign positional vertigo, Rx w/ meclizine...  ~  she cancelled a planned neuro evaluation 4/08...  TIA >> SEE ABOVE in-patient eval 1/13 by [facility] & Stroke team... ASA stopped & pt placed on PLAVIX 75mg /d...  ANXIETY (ICD-300.00) - on XANAX 0.5mg Tid prn... ~  8/12:  She is c/o insomnia & the Xanax doesn't help she said; rec trial Restoril 30mg Qhs prn...  SKIN CANCER - s/o Moh's surg for BCCa left cheek...  HEALTH MAINT--- GYN= prev DrFore and needs new Gyn now... mamm[facility]... had neg FlexSig 1991 DrBrodie and needs routine colonoscopy scheduled... states she uses olive oil on her dry skin per DrOz... had Pneumovax 2008, & 2010 Flu shot in Oct.   Current Medications, Allergies, Past Medical History, Past Surgical History, Family History, and Social History were reviewed in [employer]  electronic medical record.   Past Surgical History  Procedure Date  . Tonsillectomy and adenoidectomy   . Total abdominal  hysterectomy w/ bilateral salpingoophorectomy   . Cholecystectomy   . Back surgery   . Mohs surgery     Outpatient Encounter Prescriptions as of 07/25/2011  Medication Sig Dispense Refill  . ALPRAZolam (XANAX) 0.5 MG tablet Take 0.25-0.5 mg by mouth 3 (three) times daily as needed. For anxiety      . atorvastatin (LIPITOR) 20 MG tablet Take 1 tablet (20 mg total) by mouth daily.  90 tablet  3  . Calcium Carbonate-Vitamin D (CALTRATE 600+D) 600-400 MG-UNIT per tablet Take 1 tablet by mouth daily.        . clopidogrel (PLAVIX) 75 MG tablet Take 1 tablet (75 mg total) by mouth daily with breakfast.  90 tablet  3  . Coenzyme Q10 (CO Q 10) 100 MG CAPS Take 1 capsule by mouth daily.  30 capsule  1  . famotidine (PEPCID) 20 MG tablet Take 1 tablet (20 mg total) by mouth at bedtime.  30 tablet  11  . fluticasone (CUTIVATE) 0.05 % cream Apply 1 application topically as needed. Apply to toe      . furosemide (LASIX) 20 MG tablet Take 20 mg by mouth daily.        . Multiple Vitamin (MULTIVITAMIN) capsule Take 1 capsule by mouth daily.        . pantoprazole (PROTONIX) 40 MG tablet Take 1 tablet (40 mg total) by mouth daily. 30 minutes prior to a meal  30 tablet  11  . potassium chloride SA (K-DUR,KLOR-CON) 20 MEQ tablet Take 20 mEq by mouth 2 (two) times daily.        Allergies  Allergen Reactions  . Codeine     REACTION: nausea  . Diltiazem Hcl     REACTION: pt states it made her dizzy...  . Sulfonamide Derivatives     REACTION: red/splotchy hives    Current Medications, Allergies, Past Medical History, Past Surgical History, Family History, and Social History were reviewed in [employer] record.    Review of Systems      Constitutional:   No  weight loss, night sweats,  Fevers, chills, fatigue, or  lassitude.  HEENT:   No headaches,  Difficulty swallowing,  Tooth/dental problems, or  Sore throat,                No sneezing, itching, ear ache, nasal congestion, post  nasal drip,   CV:  No chest pain,  Orthopnea, PND, swelling in lower extremities, anasarca, dizziness, palpitations, syncope.   GI  No heartburn, indigestion,   nausea, vomiting, diarrhea, change in bowel habits, loss of appetite, bloody stools.   Resp: No shortness of breath with exertion or at rest.  No excess mucus, no productive cough,  No non-productive cough,  No coughing up of blood.  No change in color of mucus.  No wheezing.  No chest wall deformity  Skin: no rash or lesions.  GU: no dysuria, change in color of urine, no urgency or frequency.  No flank pain, no hematuria   MS:  No joint pain or swelling.  No decreased range of motion.  No back pain.  Psych:  No change in mood or affect. No depression or anxiety.  No memory loss.       Objective:   Physical Exam     WD, Obese, 71 y/o WF in NAD... GENERAL:  Alert &  oriented; pleasant & cooperative... HEENT:  [place]/AT, EOM-wnl,  PERRLA, EACs-  TMs-wnl, NOSE-pale, THROAT-clear & wnl. NECK:  Supple w/ fairROM; no JVD; normal carotid impulses w/o bruits; no thyromegaly or nodules palpated; no lymphadenopathy. CHEST:  Clear BS bilaterally without wheezing, rales, or signs of consolidation... HEART:  Regular Rhythm; without murmurs/ rubs/ or gallops heard... ABDOMEN:  Soft   normal bowel sounds; no organomegaly or masses detected., no guarding or rebound, tender along R. Lower quad, neg CVA tenderness.  EXT: without deformities, mild arthritic changes; no varicose veins/ +venous insuffic/ tr edema... NEURO:   no focal neuro deficits... DERM:  no lesions noted...    Assessment & Plan:

## 2011-07-25 NOTE — Assessment & Plan Note (Addendum)
Right lower abdominal pain ? Etiology 2 week hx of intermittent abdominal pain, worse for 3 days  Labs are unrevealing. Exam is positive for +tenderness localized to RLQ Will set up for CT abd/pelvis to r/o appendiciits.  CBC unremarkable -wbc , UA is neg.  Will set up referral to GI to evaluate.

## 2011-07-26 ENCOUNTER — Other Ambulatory Visit: Payer: Medicare Other

## 2011-07-26 ENCOUNTER — Emergency Department (HOSPITAL_COMMUNITY): Payer: Medicare Other

## 2011-07-26 ENCOUNTER — Telehealth: Payer: Self-pay | Admitting: Internal Medicine

## 2011-07-26 ENCOUNTER — Observation Stay (HOSPITAL_COMMUNITY): Payer: Medicare Other

## 2011-07-26 ENCOUNTER — Observation Stay (HOSPITAL_COMMUNITY)
Admission: EM | Admit: 2011-07-26 | Discharge: 2011-07-30 | Disposition: A | Discharge status: Home / Self Care | Payer: Medicare Other | Attending: Internal Medicine | Admitting: Internal Medicine

## 2011-07-26 ENCOUNTER — Encounter (HOSPITAL_COMMUNITY): Payer: Self-pay | Admitting: Emergency Medicine

## 2011-07-26 DIAGNOSIS — M79604 Pain in right leg: Secondary | ICD-10-CM | POA: Diagnosis present

## 2011-07-26 DIAGNOSIS — K5730 Diverticulosis of large intestine without perforation or abscess without bleeding: Secondary | ICD-10-CM | POA: Insufficient documentation

## 2011-07-26 DIAGNOSIS — R279 Unspecified lack of coordination: Secondary | ICD-10-CM | POA: Insufficient documentation

## 2011-07-26 DIAGNOSIS — G459 Transient cerebral ischemic attack, unspecified: Secondary | ICD-10-CM | POA: Insufficient documentation

## 2011-07-26 DIAGNOSIS — M797 Fibromyalgia: Secondary | ICD-10-CM | POA: Diagnosis present

## 2011-07-26 DIAGNOSIS — M48061 Spinal stenosis, lumbar region without neurogenic claudication: Secondary | ICD-10-CM | POA: Insufficient documentation

## 2011-07-26 DIAGNOSIS — I10 Essential (primary) hypertension: Secondary | ICD-10-CM | POA: Insufficient documentation

## 2011-07-26 DIAGNOSIS — Z79899 Other long term (current) drug therapy: Secondary | ICD-10-CM | POA: Insufficient documentation

## 2011-07-26 DIAGNOSIS — E669 Obesity, unspecified: Secondary | ICD-10-CM

## 2011-07-26 DIAGNOSIS — K7689 Other specified diseases of liver: Secondary | ICD-10-CM

## 2011-07-26 DIAGNOSIS — M79606 Pain in leg, unspecified: Secondary | ICD-10-CM

## 2011-07-26 DIAGNOSIS — M79609 Pain in unspecified limb: Secondary | ICD-10-CM | POA: Insufficient documentation

## 2011-07-26 DIAGNOSIS — M25559 Pain in unspecified hip: Secondary | ICD-10-CM | POA: Insufficient documentation

## 2011-07-26 DIAGNOSIS — Z90710 Acquired absence of both cervix and uterus: Secondary | ICD-10-CM | POA: Insufficient documentation

## 2011-07-26 DIAGNOSIS — I2510 Atherosclerotic heart disease of native coronary artery without angina pectoris: Secondary | ICD-10-CM | POA: Insufficient documentation

## 2011-07-26 DIAGNOSIS — F411 Generalized anxiety disorder: Secondary | ICD-10-CM

## 2011-07-26 DIAGNOSIS — R42 Dizziness and giddiness: Secondary | ICD-10-CM

## 2011-07-26 DIAGNOSIS — R55 Syncope and collapse: Secondary | ICD-10-CM

## 2011-07-26 DIAGNOSIS — M899 Disorder of bone, unspecified: Secondary | ICD-10-CM

## 2011-07-26 DIAGNOSIS — R262 Difficulty in walking, not elsewhere classified: Secondary | ICD-10-CM

## 2011-07-26 DIAGNOSIS — R7989 Other specified abnormal findings of blood chemistry: Secondary | ICD-10-CM | POA: Insufficient documentation

## 2011-07-26 DIAGNOSIS — K449 Diaphragmatic hernia without obstruction or gangrene: Secondary | ICD-10-CM | POA: Insufficient documentation

## 2011-07-26 DIAGNOSIS — E785 Hyperlipidemia, unspecified: Secondary | ICD-10-CM | POA: Insufficient documentation

## 2011-07-26 DIAGNOSIS — R1031 Right lower quadrant pain: Secondary | ICD-10-CM | POA: Insufficient documentation

## 2011-07-26 DIAGNOSIS — IMO0001 Reserved for inherently not codable concepts without codable children: Secondary | ICD-10-CM

## 2011-07-26 DIAGNOSIS — K429 Umbilical hernia without obstruction or gangrene: Secondary | ICD-10-CM | POA: Insufficient documentation

## 2011-07-26 DIAGNOSIS — M5126 Other intervertebral disc displacement, lumbar region: Principal | ICD-10-CM | POA: Insufficient documentation

## 2011-07-26 DIAGNOSIS — M545 Low back pain, unspecified: Secondary | ICD-10-CM

## 2011-07-26 DIAGNOSIS — R109 Unspecified abdominal pain: Secondary | ICD-10-CM

## 2011-07-26 LAB — COMPREHENSIVE METABOLIC PANEL
ALT: 25 U/L (ref 0–35)
Alkaline Phosphatase: 116 U/L (ref 39–117)
BUN: 11 mg/dL (ref 6–23)
CO2: 25 mEq/L (ref 19–32)
GFR calc Af Amer: >90 mL/min (ref >90)
GFR calc non Af Amer: 87 mL/min — ABNORMAL LOW (ref >90)
Glucose, Bld: 106 mg/dL — ABNORMAL HIGH (ref 70–99)
Potassium: 3.7 mEq/L (ref 3.5–5.1)
Sodium: 141 mEq/L (ref 135–145)

## 2011-07-26 LAB — URINALYSIS, ROUTINE W REFLEX MICROSCOPIC
Bilirubin Urine: NEGATIVE
Hgb urine dipstick: NEGATIVE
Ketones, ur: NEGATIVE mg/dL
Protein, ur: NEGATIVE mg/dL
Urobilinogen, UA: 1 mg/dL (ref 0.0–1.0)

## 2011-07-26 LAB — DIFFERENTIAL
Eosinophils Relative: 2 % (ref 0–5)
Lymphocytes Relative: 39 % (ref 12–46)
Lymphs Abs: 1.4 10*3/uL (ref 0.7–4.0)
Monocytes Relative: 6 % (ref 3–12)
Neutrophils Relative %: 53 % (ref 43–77)

## 2011-07-26 LAB — CBC
Hemoglobin: 12 g/dL (ref 12.0–15.0)
MCV: 101.5 fL — ABNORMAL HIGH (ref 78.0–100.0)
Platelets: 141 10*3/uL — ABNORMAL LOW (ref 150–400)
RBC: 3.44 MIL/uL — ABNORMAL LOW (ref 3.87–5.11)
WBC: 3.7 10*3/uL — ABNORMAL LOW (ref 4.0–10.5)

## 2011-07-26 LAB — LACTIC ACID, PLASMA: Lactic Acid, Venous: 0.8 mmol/L (ref 0.5–2.2)

## 2011-07-26 MED ORDER — POTASSIUM CHLORIDE CRYS ER 20 MEQ PO TBCR
20.0000 meq | EXTENDED_RELEASE_TABLET | Freq: Two times a day (BID) | ORAL | Status: DC
  Administered 2011-07-26 – 2011-07-30 (×9): 20 meq via ORAL
  Filled 2011-07-26 (×10): qty 1

## 2011-07-26 MED ORDER — SODIUM CHLORIDE 0.9 % IJ SOLN
3.0000 mL | Freq: Two times a day (BID) | INTRAMUSCULAR | Status: DC
Start: 2011-07-26 — End: 2011-07-30
  Administered 2011-07-26 – 2011-07-27 (×4): 3 mL via INTRAVENOUS
  Administered 2011-07-28: 14:00:00 via INTRAVENOUS
  Administered 2011-07-28 – 2011-07-29 (×2): 3 mL via INTRAVENOUS

## 2011-07-26 MED ORDER — CLOPIDOGREL BISULFATE 75 MG PO TABS
75.0000 mg | ORAL_TABLET | Freq: Every day | ORAL | Status: DC
  Administered 2011-07-27 – 2011-07-30 (×3): 75 mg via ORAL
  Filled 2011-07-26 (×4): qty 1

## 2011-07-26 MED ORDER — ACETAMINOPHEN 325 MG PO TABS
650.0000 mg | ORAL_TABLET | Freq: Four times a day (QID) | ORAL | Status: DC | PRN
  Administered 2011-07-28 – 2011-07-29 (×4): 650 mg via ORAL
  Filled 2011-07-26 (×4): qty 2

## 2011-07-26 MED ORDER — DOCUSATE SODIUM 100 MG PO CAPS
100.0000 mg | ORAL_CAPSULE | Freq: Two times a day (BID) | ORAL | Status: DC
Start: 2011-07-26 — End: 2011-07-30
  Administered 2011-07-26 – 2011-07-29 (×8): 100 mg via ORAL
  Filled 2011-07-26 (×10): qty 1

## 2011-07-26 MED ORDER — HYDROMORPHONE HCL PF 1 MG/ML IJ SOLN: 0.5000 mg | INTRAMUSCULAR | Status: DC | PRN

## 2011-07-26 MED ORDER — FLUTICASONE PROPIONATE 0.05 % EX CREA: 1.0000 "application " | TOPICAL_CREAM | CUTANEOUS | Status: DC | PRN

## 2011-07-26 MED ORDER — GADOBENATE DIMEGLUMINE 529 MG/ML IV SOLN
18.0000 mL | Freq: Once | INTRAVENOUS | Status: AC | PRN
  Administered 2011-07-26: 18 mL via INTRAVENOUS

## 2011-07-26 MED ORDER — TRAMADOL HCL 50 MG PO TABS
50.0000 mg | ORAL_TABLET | Freq: Three times a day (TID) | ORAL | Status: DC
  Administered 2011-07-26: 50 mg via ORAL
  Filled 2011-07-26 (×4): qty 1

## 2011-07-26 MED ORDER — CALCIUM CARBONATE-VITAMIN D 500-200 MG-UNIT PO TABS
1.0000 | ORAL_TABLET | Freq: Every day | ORAL | Status: DC
  Administered 2011-07-26 – 2011-07-30 (×5): 1 via ORAL
  Filled 2011-07-26 (×5): qty 1

## 2011-07-26 MED ORDER — FAMOTIDINE 20 MG PO TABS
20.0000 mg | ORAL_TABLET | Freq: Every day | ORAL | Status: DC
  Administered 2011-07-26 – 2011-07-27 (×2): 20 mg via ORAL
  Filled 2011-07-26 (×4): qty 1

## 2011-07-26 MED ORDER — ONDANSETRON HCL 4 MG PO TABS: 4.0000 mg | ORAL_TABLET | Freq: Four times a day (QID) | ORAL | Status: DC | PRN

## 2011-07-26 MED ORDER — ADULT MULTIVITAMIN W/MINERALS CH
1.0000 | ORAL_TABLET | Freq: Every day | ORAL | Status: DC
  Administered 2011-07-26 – 2011-07-30 (×5): 1 via ORAL
  Filled 2011-07-26 (×5): qty 1

## 2011-07-26 MED ORDER — HEPARIN SODIUM (PORCINE) 5000 UNIT/ML IJ SOLN
5000.0000 [IU] | Freq: Three times a day (TID) | INTRAMUSCULAR | Status: DC
  Administered 2011-07-26 – 2011-07-29 (×10): 5000 [IU] via SUBCUTANEOUS
  Filled 2011-07-26 (×15): qty 1

## 2011-07-26 MED ORDER — PANTOPRAZOLE SODIUM 40 MG PO TBEC
40.0000 mg | DELAYED_RELEASE_TABLET | Freq: Every day | ORAL | Status: DC
  Administered 2011-07-26 – 2011-07-30 (×5): 40 mg via ORAL
  Filled 2011-07-26 (×5): qty 1

## 2011-07-26 MED ORDER — MULTIVITAMINS PO CAPS: 1.0000 | ORAL_CAPSULE | Freq: Every day | ORAL | Status: DC

## 2011-07-26 MED ORDER — ALPRAZOLAM 0.25 MG PO TABS
0.2500 mg | ORAL_TABLET | Freq: Three times a day (TID) | ORAL | Status: DC | PRN
  Administered 2011-07-26 – 2011-07-29 (×2): 0.5 mg via ORAL
  Filled 2011-07-26 (×4): qty 2

## 2011-07-26 MED ORDER — IOHEXOL 300 MG/ML  SOLN: 100.0000 mL | Freq: Once | INTRAMUSCULAR | Status: AC | PRN

## 2011-07-26 MED ORDER — ACETAMINOPHEN 650 MG RE SUPP: 650.0000 mg | Freq: Four times a day (QID) | RECTAL | Status: DC | PRN

## 2011-07-26 MED ORDER — FUROSEMIDE 20 MG PO TABS
20.0000 mg | ORAL_TABLET | Freq: Every day | ORAL | Status: DC
  Administered 2011-07-26 – 2011-07-30 (×5): 20 mg via ORAL
  Filled 2011-07-26 (×5): qty 1

## 2011-07-26 MED ORDER — FLUTICASONE PROPIONATE 0.005 % EX OINT
TOPICAL_OINTMENT | CUTANEOUS | Status: DC | PRN
  Filled 2011-07-26: qty 30

## 2011-07-26 MED ORDER — ONDANSETRON HCL 4 MG/2ML IJ SOLN: 4.0000 mg | Freq: Four times a day (QID) | INTRAMUSCULAR | Status: DC | PRN

## 2011-07-26 MED ORDER — SODIUM CHLORIDE 0.9 % IV SOLN: 250.0000 mL | INTRAVENOUS | Status: DC | PRN

## 2011-07-26 MED ORDER — ROSUVASTATIN CALCIUM 20 MG PO TABS
20.0000 mg | ORAL_TABLET | Freq: Every day | ORAL | Status: DC
  Administered 2011-07-26 – 2011-07-29 (×4): 20 mg via ORAL
  Filled 2011-07-26 (×5): qty 1

## 2011-07-26 MED ORDER — CALCIUM CARBONATE-VITAMIN D 600-400 MG-UNIT PO TABS: 1.0000 | ORAL_TABLET | Freq: Every day | ORAL | Status: DC

## 2011-07-26 MED ORDER — SODIUM CHLORIDE 0.9 % IJ SOLN
3.0000 mL | INTRAMUSCULAR | Status: DC | PRN
  Administered 2011-07-26 – 2011-07-29 (×2): 3 mL via INTRAVENOUS

## 2011-07-26 MED ORDER — CO Q 10 100 MG PO CAPS: 1.0000 | ORAL_CAPSULE | Freq: Every day | ORAL | Status: DC

## 2011-07-26 MED ORDER — ONDANSETRON HCL 4 MG/2ML IJ SOLN
INTRAMUSCULAR | Status: AC
  Administered 2011-07-26
  Filled 2011-07-26: qty 2

## 2011-07-26 NOTE — ED Notes (Signed)
Pt transferred to 1309, 3E accompanied by NT with chart and personal belongings, condition stable at time of transfer.

## 2011-07-26 NOTE — Telephone Encounter (Signed)
Bjorn Loser returned my call and patient has been admitted to hospital. She will keep the 08/16/11 appointment.

## 2011-07-26 NOTE — Evaluation (Signed)
Physical Therapy Evaluation Patient Details Name: [name] MRN: [id number] DOB: 01/12/1941 Today's Date: 07/26/2011  Problem List:  Patient Active Problem List  Diagnoses  . HYPERLIPIDEMIA  . OBESITY  . ANXIETY  . HYPERTENSION  . CORONARY ARTERY DISEASE  . DIVERTICULOSIS OF COLON  . FATTY LIVER DISEASE  . LOW BACK PAIN, CHRONIC  . FIBROMYALGIA  . OSTEOPENIA  . SYNCOPE  . DIZZINESS  . TIA (transient ischemic attack)  . Abdominal pain, acute  . Low back pain radiating to right leg  . TIA on medication  . Fibromyalgia  . HTN (hypertension)  . Dyslipidemia  . CAD (coronary artery disease)    Past Medical History:  Past Medical History  Diagnosis Date  . Unspecified essential hypertension   . CAD (coronary artery disease)   . Other and unspecified hyperlipidemia   . Obesity, unspecified   . Diverticulosis of colon (without mention of hemorrhage)   . Other chronic nonalcoholic liver disease   . Myalgia and myositis, unspecified   . Disorder of bone and cartilage, unspecified   . Dizziness and giddiness   . Anxiety state, unspecified    Past Surgical History:  Past Surgical History  Procedure Date  . Tonsillectomy and adenoidectomy   . Total abdominal hysterectomy w/ bilateral salpingoophorectomy   . Cholecystectomy   . Back surgery   . Mohs surgery     PT Assessment/Plan/Recommendation PT Assessment Clinical Impression Statement: Pt presents with minor back pain and pain in R hip which radiates down R leg to foot.  Pt to receive lumbosacral MRI to assess source of pain.  Pt unable to ambulate more than 20' at a time due to increasing pain in R LE.  Pt will benefit from skilled PT in acute venue to address deficits.  PT recommends HHPT for pt at D/C.  Will continue to follow pt and monitor MRI results.  PT Recommendation/Assessment: Patient will need skilled PT in the acute care venue PT Problem List: Decreased strength;Decreased activity tolerance;Decreased  mobility;Decreased balance;Decreased coordination;Decreased knowledge of use of DME;Pain Barriers to Discharge: None PT Therapy Diagnosis : Difficulty walking;Abnormality of gait;Acute pain;Generalized weakness PT Plan PT Frequency: Min 3X/week PT Treatment/Interventions: Gait training;Stair training;Functional mobility training;Therapeutic activities;Therapeutic exercise;Balance training;Patient/family education PT Recommendation Follow Up Recommendations: Home health PT Equipment Recommended: None recommended by PT PT Goals  Acute Rehab PT Goals PT Goal Formulation: With patient Time For Goal Achievement: 2 weeks Pt will go Supine/Side to Sit: with supervision PT Goal: Supine/Side to Sit - Progress: Goal set today Pt will go Sit to Supine/Side: with supervision PT Goal: Sit to Supine/Side - Progress: Goal set today Pt will go Sit to Stand: with supervision PT Goal: Sit to Stand - Progress: Goal set today Pt will go Stand to Sit: with supervision PT Goal: Stand to Sit - Progress: Goal set today Pt will Ambulate: 51 - 150 feet;with supervision;with least restrictive assistive device PT Goal: Ambulate - Progress: Goal set today Pt will Go Up / Down Stairs: 1-2 stairs;with min assist;with least restrictive assistive device PT Goal: Up/Down Stairs - Progress: Goal set today Pt will Perform Home Exercise Program: with supervision, verbal cues required/provided PT Goal: Perform Home Exercise Program - Progress: Goal set today  PT Evaluation Precautions/Restrictions  Precautions Precautions: Fall Required Braces or Orthoses: No Restrictions Weight Bearing Restrictions: No Prior Functioning  Home Living Lives With: Spouse Receives Help From: Family Type of Home: House Home Layout: One level Home Access: Stairs to enter Entrance Stairs-Rails:  None Entrance Stairs-Number of Steps: 1 Bathroom Shower/Tub: Naval architect Equipment: Walker - rolling;Straight cane Prior  Function Level of Independence: Independent with basic ADLs;Independent with gait;Independent with transfers Driving: Yes Vocation: Retired Financial risk analyst Arousal/Alertness: Awake/alert Overall Cognitive Status: Appears within functional limits for tasks assessed Orientation Level: Oriented X4 Sensation/Coordination Sensation Light Touch: Appears Intact Coordination Gross Motor Movements are Fluid and Coordinated: Yes Extremity Assessment RLE Strength RLE Overall Strength Comments: Grossly WFL, however all motions increased pain.  LLE Assessment LLE Assessment: Within Functional Limits Mobility (including Balance) Bed Mobility Bed Mobility: Yes Supine to Sit: 4: Min assist Supine to Sit Details (indicate cue type and reason): Requires some assist for RLE due to increased pain with movement.  cues for technique.  Transfers Transfers: Yes Sit to Stand: 4: Min assist;3: Mod assist;With upper extremity assist;From bed Sit to Stand Details (indicate cue type and reason): Min/Mod assist for forward weight shift and steadying upon standing.  cues for hand placement on bed before standing.  Stand to Sit: 4: Min assist;With upper extremity assist;To chair/3-in-1 Stand to Sit Details: Min/guard for safety with cues for hand placement on chair.  Ambulation/Gait Ambulation/Gait: Yes Ambulation/Gait Assistance: 4: Min assist Ambulation/Gait Assistance Details (indicate cue type and reason): Min/guard for safety with cues for sequencing/technique with RW.   Ambulation Distance (Feet): 20 Feet (then another 8' to restroom from bed. ) Assistive device: Rolling walker Gait Pattern: Step-to pattern;Decreased stance time - right Stairs: No Wheelchair Mobility Wheelchair Mobility: No    Exercise    End of Session PT - End of Session Equipment Utilized During Treatment: Gait belt Activity Tolerance: Patient limited by pain Patient left: in chair;with call bell in reach Nurse  Communication: Mobility status for transfers;Mobility status for ambulation General Behavior During Session: [facility] for tasks performed Cognition: [facility] for tasks performed  Page, Meribeth Mattes 07/26/2011, 4:21 PM

## 2011-07-26 NOTE — Telephone Encounter (Signed)
Left a message for [name] to call me. Looks as if patient has been admitted to hospital.

## 2011-07-26 NOTE — ED Notes (Signed)
Patient transported to CT 

## 2011-07-26 NOTE — ED Notes (Signed)
[name] Daughter. [phone]. Contact if needed

## 2011-07-26 NOTE — ED Notes (Signed)
MD (Dr. Alto Denver) aware of pt and family questioning xray results, in to speak with them at present, will monitor.

## 2011-07-26 NOTE — ED Notes (Signed)
As per EMS pt had abdominal pain that started this am , was worse this afternoon

## 2011-07-26 NOTE — ED Notes (Signed)
[id number]<AV> Expected date:07/25/11<BR> Expected time:11:50 PM<BR> Means of arrival:Ambulance<BR> Comments:<BR> EMS 261 GC - abd pain

## 2011-07-26 NOTE — Progress Notes (Signed)
   CARE MANAGEMENT NOTE 07/26/2011  Patient:  [name]   Account Number:  [id number]  Date Initiated:  07/26/2011  Documentation initiated by:  [facility]  Subjective/Objective Assessment:   ADMITTED W/LWR ABD PAIN.HX: CAD     Action/Plan:   FROM HOME W/SPOUSE   Anticipated DC Date:  07/27/2011   Anticipated DC Plan:  HOME/SELF CARE         Choice offered to / List presented to:             Status of service:  In process, will continue to follow Medicare Important Message given?   (If response is "NO", the following Medicare IM given date fields will be blank) Date Medicare IM given:   Date Additional Medicare IM given:    Discharge Disposition:    Per UR Regulation:  Reviewed for med. necessity/level of care/duration of stay  Comments:  07/26/11 [facility] RN,BSN NCM [phone]

## 2011-07-26 NOTE — Progress Notes (Signed)
PHARMACIST - PHYSICIAN ORDER COMMUNICATION  CONCERNING: P&T Medication Policy on Herbal Medications  DESCRIPTION:  This patient's order for:  Co Enzyme Q10  has been noted.  This product(s) is classified as an "herbal" or natural product. Due to a lack of definitive safety studies or FDA approval, nonstandard manufacturing practices, plus the potential risk of unknown drug-drug interactions while on inpatient medications, the Pharmacy and Therapeutics Committee does not permit the use of "herbal" or natural products of this type within [place].   ACTION TAKEN: The pharmacy department is unable to verify this order at this time and your patient has been informed of this safety policy. Please reevaluate patient's clinical condition at discharge and address if the herbal or natural product(s) should be resumed at that time.   

## 2011-07-26 NOTE — H&P (Signed)
PCP:   Michele Mcalpine, MD, MD  Primary Cardiologist: Corinda Gubler.  Chief Complaint:  Lower abdominal pain, right leg pain and difficulty in ambulation.  HPI: This is a 71 year old female, with known history of obesity, HTN, non-obstructive CAD, per cardiac catheterization of 03/2006, diastolic dysfunction, EF 60%-65%, per 2D Echo 03/2006, anxiety,, fibromyalgia, dyslipidemia, recurrent syncope 2008-2009, of unclear etiology, s/p cholecystectomy, hysterectomy, tonsillectomy/adenoidectomy, and back surgery, who presents with above symptoms. According to patient, who is quite a good historian, she has just been discharged, after hospitalization at [facility], from 07/13/11-07/14/11, for TIA, and has been quite well, since then, till 07/25/11, at about 9:00 PM, when she experienced a pain in her right lower abdomen, while sitting in a chair, watching TV. The pain has remained constant, and on getting up, she experienced right leg pain, radiating down the back of her leg, which has been progressive, making it difficult for her to weight-bear. By 07/23/11-07/24/11, she was no longer able to ambulate, and has been chair-bound/bed-bound, since. She went to her primary MD's office on 07/25/11, was seen by the PA, Tammy, an abdominal CT scan was arranged, and she was sent home, with instructions to go to the ED, should symptoms worsen. At about midnight, she was unable to get up after using the bathroom, so her family brought her to the ED. She has no history of antecedent trauma or fall.  Allergies:   Allergies  Allergen Reactions  . Codeine     REACTION: nausea  . Diltiazem Hcl     REACTION: pt states it made her dizzy...  . Sulfonamide Derivatives     REACTION: red/splotchy hives      Past Medical History  Diagnosis Date  . Unspecified essential hypertension   . CAD (coronary artery disease)   . Other and unspecified hyperlipidemia   . Obesity, unspecified   . Diverticulosis of colon (without mention of hemorrhage)    . Other chronic nonalcoholic liver disease   . Myalgia and myositis, unspecified   . Disorder of bone and cartilage, unspecified   . Dizziness and giddiness   . Anxiety state, unspecified     Past Surgical History  Procedure Date  . Tonsillectomy and adenoidectomy   . Total abdominal hysterectomy w/ bilateral salpingoophorectomy   . Cholecystectomy   . Back surgery   . Mohs surgery     Prior to Admission medications   Medication Sig Start Date End Date Taking? Authorizing Provider  ALPRAZolam Prudy Feeler) 0.5 MG tablet Take 0.25-0.5 mg by mouth 3 (three) times daily as needed. For anxiety   Yes Historical Provider, MD  atorvastatin (LIPITOR) 20 MG tablet Take 1 tablet (20 mg total) by mouth daily. 07/18/11 07/17/12 Yes Michele Mcalpine, MD  Calcium Carbonate-Vitamin D (CALTRATE 600+D) 600-400 MG-UNIT per tablet Take 1 tablet by mouth daily.     Yes Historical Provider, MD  clopidogrel (PLAVIX) 75 MG tablet Take 1 tablet (75 mg total) by mouth daily with breakfast. 07/18/11 07/17/12 Yes Michele Mcalpine, MD  Coenzyme Q10 (CO Q 10) 100 MG CAPS Take 1 capsule by mouth daily. 07/14/11  Yes Ripudeep Jenna Luo, MD  famotidine (PEPCID) 20 MG tablet Take 1 tablet (20 mg total) by mouth at bedtime. 07/19/11 07/18/12 Yes Michele Mcalpine, MD  fluticasone (CUTIVATE) 0.05 % cream Apply 1 application topically as needed. Apply to toe 06/28/11  Yes Historical Provider, MD  furosemide (LASIX) 20 MG tablet Take 20 mg by mouth daily.     Yes  Historical Provider, MD  Multiple Vitamin (MULTIVITAMIN) capsule Take 1 capsule by mouth daily.     Yes Historical Provider, MD  pantoprazole (PROTONIX) 40 MG tablet Take 1 tablet (40 mg total) by mouth daily. 30 minutes prior to a meal 07/19/11 07/18/12 Yes Scott Elayne Snare, MD  potassium chloride SA (K-DUR,KLOR-CON) 20 MEQ tablet Take 20 mEq by mouth 2 (two) times daily.   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  reports  that she has never smoked. She does not have any smokeless tobacco history on file. She reports that she does not drink alcohol or use illicit drugs.  Family History  Problem Relation Age of Onset  . Stomach cancer Mother   . Cancer Brother   . Breast cancer Sister   . Diabetes     Mother died in her 46s, and father died in his 19s, after returning from WW[place], with tuberculosis and pneumonia.  Review of Systems:  As per HPI and chief complaint. Patent denies fatigue, diminished appetite, weight loss, fever, chills, headache, blurred vision, difficulty in speaking, dysphagia, chest pain, cough, shortness of breath, orthopnea, paroxysmal nocturnal dyspnea, nausea, diaphoresis, vomited once in the ED, no , diarrhea, belching, heartburn, hematemesis, melena, dysuria, nocturia, urinary frequency, hematochezia, lower extremity swelling, pain, or redness. The rest of the systems review is negative.  Physical Exam:  General:  Patient does not appear to be in obvious acute distress. Alert, communicative, fully oriented, talking in complete sentences, not short of breath at rest.  HEENT:  No clinical pallor, no jaundice, no conjunctival injection or discharge. Hydration status is fair. NECK:  Supple, JVP not seen, no carotid bruits, no palpable lymphadenopathy, no palpable goiter. CHEST:  Clinically clear to auscultation, no wheezes, no crackles. HEART:  Sounds 1 and 2 heard, normal, regular, no murmurs. ABDOMEN:  Obese, soft, non-tender, no palpable organomegaly, no palpable masses, normal bowel sounds. GENITALIA:  Not examined. LOWER EXTREMITIES:  No pitting edema, palpable peripheral pulses. MUSCULOSKELETAL SYSTEM:  Generalized osteoarthritic changes, has localized right paraspinal muscle tenderness, in the lumbar area. Straight-leg raising test is positive on the right.. CENTRAL NERVOUS SYSTEM:  No focal neurologic deficit on gross examination.  Labs on Admission:  Results for orders placed  during the hospital encounter of 07/26/11 (from the past 48 hour(s))  URINALYSIS, ROUTINE W REFLEX MICROSCOPIC     Status: Abnormal   Collection Time   07/26/11  1:12 AM      Component Value Range Comment   Color, Urine YELLOW  YELLOW     APPearance CLEAR  CLEAR     Specific Gravity, Urine 1.016  1.005 - 1.030     pH 7.0  5.0 - 8.0     Glucose, UA NEGATIVE  NEGATIVE (mg/dL)    Hgb urine dipstick NEGATIVE  NEGATIVE     Bilirubin Urine NEGATIVE  NEGATIVE     Ketones, ur NEGATIVE  NEGATIVE (mg/dL)    Protein, ur NEGATIVE  NEGATIVE (mg/dL)    Urobilinogen, UA 1.0  0.0 - 1.0 (mg/dL)    Nitrite NEGATIVE  NEGATIVE     Leukocytes, UA SMALL (*) NEGATIVE    URINE MICROSCOPIC-ADD ON     Status: Abnormal   Collection Time   07/26/11  1:12 AM      Component Value Range Comment   Squamous Epithelial / LPF MANY (*) RARE     WBC, UA 3-6  <3 (WBC/hpf)  Bacteria, UA FEW (*) RARE    LACTIC ACID, PLASMA     Status: Normal   Collection Time   07/26/11  1:31 AM      Component Value Range Comment   Lactic Acid, Venous 1.0  0.5 - 2.2 (mmol/L)   CBC     Status: Abnormal   Collection Time   07/26/11  1:51 AM      Component Value Range Comment   WBC 3.7 (*) 4.0 - 10.5 (K/uL)    RBC 3.44 (*) 3.87 - 5.11 (MIL/uL)    Hemoglobin 12.0  12.0 - 15.0 (g/dL)    HCT 16.1 (*) 09.6 - 46.0 (%)    MCV 101.5 (*) 78.0 - 100.0 (fL)    MCH 34.9 (*) 26.0 - 34.0 (pg)    MCHC 34.4  30.0 - 36.0 (g/dL)    RDW 04.5  40.9 - 81.1 (%)    Platelets 141 (*) 150 - 400 (K/uL)   DIFFERENTIAL     Status: Normal   Collection Time   07/26/11  1:51 AM      Component Value Range Comment   Neutrophils Relative 53  43 - 77 (%)    Neutro Abs 1.9  1.7 - 7.7 (K/uL)    Lymphocytes Relative 39  12 - 46 (%)    Lymphs Abs 1.4  0.7 - 4.0 (K/uL)    Monocytes Relative 6  3 - 12 (%)    Monocytes Absolute 0.2  0.1 - 1.0 (K/uL)    Eosinophils Relative 2  0 - 5 (%)    Eosinophils Absolute 0.1  0.0 - 0.7 (K/uL)    Basophils Relative 1  0 - 1 (%)     Basophils Absolute 0.0  0.0 - 0.1 (K/uL)   COMPREHENSIVE METABOLIC PANEL     Status: Abnormal   Collection Time   07/26/11  1:51 AM      Component Value Range Comment   Sodium 141  135 - 145 (mEq/L)    Potassium 3.7  3.5 - 5.1 (mEq/L)    Chloride 107  96 - 112 (mEq/L)    CO2 25  19 - 32 (mEq/L)    Glucose, Bld 106 (*) 70 - 99 (mg/dL)    BUN 11  6 - 23 (mg/dL)    Creatinine, Ser 9.14  0.50 - 1.10 (mg/dL)    Calcium 9.1  8.4 - 10.5 (mg/dL)    Total Protein 6.5  6.0 - 8.3 (g/dL)    Albumin 3.5  3.5 - 5.2 (g/dL)    AST 39 (*) 0 - 37 (U/L)    ALT 25  0 - 35 (U/L)    Alkaline Phosphatase 116  39 - 117 (U/L)    Total Bilirubin 0.4  0.3 - 1.2 (mg/dL)    GFR calc non Af Amer 87 (*) >90 (mL/min)    GFR calc Af Amer >90  >90 (mL/min)   LIPASE, BLOOD     Status: Normal   Collection Time   07/26/11  1:51 AM      Component Value Range Comment   Lipase 27  11 - 59 (U/L)   LACTIC ACID, PLASMA     Status: Normal   Collection Time   07/26/11  5:16 AM      Component Value Range Comment   Lactic Acid, Venous 0.8  0.5 - 2.2 (mmol/L)     Radiological Exams on Admission: *RADIOLOGY REPORT*  Clinical Data: Right hip pain  RIGHT HIP - COMPLETE 2+  VIEW  Comparison: 07/26/2011 CT  Findings: No fracture or dislocation. No aggressive osseous  lesion. Contrast opacifies loops of bowel and the bladder.  IMPRESSION:  No acute osseous abnormality.  Original Report Authenticated By: Waneta Martins, M.D.  *RADIOLOGY REPORT*  Clinical Data: Abdominal pain  CT ABDOMEN AND PELVIS WITH CONTRAST  Technique: Multidetector CT imaging of the abdomen and pelvis was  performed following the standard protocol during bolus  administration of intravenous contrast.  Contrast: 100 ml of Omnipaque-300  Comparison: 02/24/2009  Findings: Lung bases are clear. Small hiatal hernia and distal  esophageal wall thickening.  Round cystic density along the inferior margin of the right hepatic  lobe is without significant  interval change. Diffuse low  attenuation suggests fatty infiltration. Unremarkable spleen,  pancreas, adrenal glands. Biliary ductal prominence, with the CBD  measuring up to 1 cm. This is without significant interval change.  Tapers to the level of the ampulla.  Lobular renal contours without focal abnormality. No  hydronephrosis or hydroureter.  Colonic diverticulosis. No CT evidence for diverticulitis. Normal  appendix. No bowel obstruction. No free intraperitoneal air or  fluid. No lymphadenopathy. Tiny fat containing umbilical hernia.  Thin-walled bladder. Absent uterus. No adnexal mass. Trace free  fluid within the pelvis.  There is scattered atherosclerotic calcification of the aorta and  its branches. No aneurysmal dilatation.  Multilevel degenerative changes of the imaged spine. No acute or  aggressive appearing osseous lesion.  IMPRESSION:  No acute CT abnormality identified.  Low attenuation of the liver suggests fatty infiltration.  CBD prominence up to 1 cm. Correlate with LFTs and ERCP or MRCP if  warranted.  Colonic diverticulosis without CT evidence for diverticulitis.  Status post hysterectomy. Trace free fluid within the pelvis is  nonspecific.  Original Report Authenticated By: Waneta Martins, M.D.   Assessment/Plan Principal Problem:  *Low back pain radiating to right leg: Patient clearly has DJD and right radiculopathy, based on mode of presentation, absence of acute pathology on imaging studies, and findings of physical examination, including localized lumbar paraspinal muscle tenderness, and positive right straight-leg raising test. We shall arrange lumbo-sacral MRI, to elucidate anatomy, but meanwhile, manage with analgesics and physiotherapy. Definitive management and appropriate consultation if indicated, will depend on MRI results. Active Problems:  1. TIA on medication: Patient is s/p recent TIA, and has remained asymptomatic since, on anti-platelet  medication, which we shall continue.  2. Fibromyalgia: Not problematic at this time.  3. HTN (hypertension): Controlled on pre-admission anti-hypertensives.  4. Dyslipidemia: On statin treatment.  5. CAD (coronary artery disease): Asymptomatic.   Time Spent on Admission: 45 mins.  Jalayna Josten,CHRISTOPHER 07/26/2011, 10:57 AM    \

## 2011-07-26 NOTE — ED Provider Notes (Signed)
History     CSN: [id number]  Arrival date & time 07/26/11  0003   First MD Initiated Contact with Patient 07/26/11 0041      Chief Complaint  Patient presents with  . Abdominal Pain    (Consider location/radiation/quality/duration/timing/severity/associated sxs/prior treatment) HPI Patient is a 71 year old female who presents today complaining of right lower cord and abdominal pain that began this morning. This is severe enough that she called EMS. She reports that the pain radiates down the front of her right leg and makes it difficult for her to walk. She insists that this is not back pain. The patient has history of prior cholecystectomy but denies other abdominal surgeries. At this time patient's pain is only a 2/10. It is made worse with movement. Her main complaint is that she is unable to walk with this. She is able to move her legs with this. She denies any numbness, tingling, or incontinence. Patient denies any urinary symptoms or vaginal discharge. Her last bowel movement was today. She denies any fevers. There are no other associated or modifying factors. Past Medical History  Diagnosis Date  . Unspecified essential hypertension   . CAD (coronary artery disease)   . Other and unspecified hyperlipidemia   . Obesity, unspecified   . Diverticulosis of colon (without mention of hemorrhage)   . Other chronic nonalcoholic liver disease   . Myalgia and myositis, unspecified   . Disorder of bone and cartilage, unspecified   . Dizziness and giddiness   . Anxiety state, unspecified     Past Surgical History  Procedure Date  . Tonsillectomy and adenoidectomy   . Total abdominal hysterectomy w/ bilateral salpingoophorectomy   . Cholecystectomy   . Back surgery   . Mohs surgery     Family History  Problem Relation Age of Onset  . Stomach cancer Mother   . Cancer Brother   . Breast cancer Sister   . Diabetes      History  Substance Use Topics  . Smoking status: Never  Smoker   . Smokeless tobacco: Not on file  . Alcohol Use: No    OB History    Grav Para Term Preterm Abortions TAB SAB Ect Mult Living                  Review of Systems  Constitutional: Negative.   HENT: Negative.   Eyes: Negative.   Respiratory: Negative.   Cardiovascular: Negative.   Gastrointestinal: Positive for abdominal pain.  Genitourinary: Negative.   Musculoskeletal: Negative.   Skin: Negative.   Neurological: Negative.   Hematological: Negative.   Psychiatric/Behavioral: Negative.   All other systems reviewed and are negative.    Allergies  Codeine; Diltiazem hcl; and Sulfonamide derivatives  Home Medications   Current Outpatient Rx  Name Route Sig Dispense Refill  . ALPRAZOLAM 0.5 MG PO TABS Oral Take 0.25-0.5 mg by mouth 3 (three) times daily as needed. For anxiety    . ATORVASTATIN CALCIUM 20 MG PO TABS Oral Take 1 tablet (20 mg total) by mouth daily. 90 tablet 3  . CALCIUM CARBONATE-VITAMIN D 600-400 MG-UNIT PO TABS Oral Take 1 tablet by mouth daily.      Marland Kitchen CLOPIDOGREL BISULFATE 75 MG PO TABS Oral Take 1 tablet (75 mg total) by mouth daily with breakfast. 90 tablet 3  . CO Q 10 100 MG PO CAPS Oral Take 1 capsule by mouth daily. 30 capsule 1    With Lipitor  . FAMOTIDINE 20 MG PO  TABS Oral Take 1 tablet (20 mg total) by mouth at bedtime. 30 tablet 11  . FLUTICASONE PROPIONATE 0.05 % EX CREA Topical Apply 1 application topically as needed. Apply to toe    . FUROSEMIDE 20 MG PO TABS Oral Take 20 mg by mouth daily.      . MULTIVITAMINS PO CAPS Oral Take 1 capsule by mouth daily.      Marland Kitchen PANTOPRAZOLE SODIUM 40 MG PO TBEC Oral Take 1 tablet (40 mg total) by mouth daily. 30 minutes prior to a meal 30 tablet 11  . POTASSIUM CHLORIDE CRYS ER 20 MEQ PO TBCR Oral Take 20 mEq by mouth 2 (two) times daily.      BP 109/69  Pulse 63  Temp(Src) 97.6 F (36.4 C) (Oral)  Resp 18  Ht 5\' 2"  (1.575 m)  Wt 198 lb (89.812 kg)  BMI 36.21 kg/m2  SpO2 96%  Physical Exam   Nursing note and vitals reviewed. Constitutional: She is oriented to person, place, and time. She appears well-developed and well-nourished. No distress.  HENT:  Head: Normocephalic and atraumatic.  Eyes: Conjunctivae and EOM are normal. Pupils are equal, round, and reactive to light.  Neck: Normal range of motion.  Cardiovascular: Normal rate, regular rhythm, normal heart sounds and intact distal pulses.  Exam reveals no gallop and no friction rub.   No murmur heard. Pulmonary/Chest: Effort normal and breath sounds normal. No respiratory distress. She has no wheezes. She has no rales.  Abdominal: Soft. Bowel sounds are normal. She exhibits no distension. There is tenderness. There is no rebound and no guarding.       Right lower quadrant tenderness  Musculoskeletal: Normal range of motion. She exhibits no edema and no tenderness.  Neurological: She is alert and oriented to person, place, and time. No cranial nerve deficit.       Patient reports being unable to ambulate secondary to pain in her right lower extremity  Skin: Skin is warm and dry. No rash noted.  Psychiatric: She has a normal mood and affect.    ED Course  Procedures (including critical care time)  Labs Reviewed  CBC - Abnormal; Notable for the following:    WBC 3.7 (*)    RBC 3.44 (*)    HCT 34.9 (*)    MCV 101.5 (*)    MCH 34.9 (*)    Platelets 141 (*)    All other components within normal limits  COMPREHENSIVE METABOLIC PANEL - Abnormal; Notable for the following:    Glucose, Bld 106 (*)    AST 39 (*)    GFR calc non Af Amer 87 (*)    All other components within normal limits  URINALYSIS, ROUTINE W REFLEX MICROSCOPIC - Abnormal; Notable for the following:    Leukocytes, UA SMALL (*)    All other components within normal limits  URINE MICROSCOPIC-ADD ON - Abnormal; Notable for the following:    Squamous Epithelial / LPF MANY (*)    Bacteria, UA FEW (*)    All other components within normal limits    DIFFERENTIAL  LIPASE, BLOOD  LACTIC ACID, PLASMA  LACTIC ACID, PLASMA   Dg Hip Complete Right  07/26/2011  *RADIOLOGY REPORT*  Clinical Data: Right hip pain  RIGHT HIP - COMPLETE 2+ VIEW  Comparison: 07/26/2011 CT  Findings: No fracture or dislocation.  No aggressive osseous lesion.  Contrast opacifies loops of bowel and the bladder.  IMPRESSION: No acute osseous abnormality.  Original Report Authenticated By: Greig Castilla  [facility]. [facility], M.D.   Ct Abdomen Pelvis W Contrast  07/26/2011  *RADIOLOGY REPORT*  Clinical Data: Abdominal pain  CT ABDOMEN AND PELVIS WITH CONTRAST  Technique:  Multidetector CT imaging of the abdomen and pelvis was performed following the standard protocol during bolus administration of intravenous contrast.  Contrast:  100 ml of Omnipaque-300  Comparison: 02/24/2009  Findings: Lung bases are clear.  Small hiatal hernia and distal esophageal wall thickening.  Round cystic density along the inferior margin of the right hepatic lobe is without significant interval change.  Diffuse low attenuation suggests fatty infiltration.  Unremarkable spleen, pancreas, adrenal glands.  Biliary ductal prominence, with the CBD measuring up to 1 cm.  This is without significant interval change. Tapers to the level of the ampulla.  Lobular renal contours without focal abnormality.  No hydronephrosis or hydroureter.  Colonic diverticulosis.  No CT evidence for diverticulitis.  Normal appendix.  No bowel obstruction.  No free intraperitoneal air or fluid.  No lymphadenopathy.  Tiny fat containing umbilical hernia.  Thin-walled bladder.  Absent uterus.  No adnexal mass.  Trace free fluid within the pelvis.  There is scattered atherosclerotic calcification of the aorta and its branches. No aneurysmal dilatation.  Multilevel degenerative changes of the imaged spine. No acute or aggressive appearing osseous lesion.  IMPRESSION: No acute CT abnormality identified.  Low attenuation of the liver suggests fatty  infiltration.  CBD prominence up to 1 cm.  Correlate with LFTs and ERCP or MRCP if warranted.  Colonic diverticulosis without CT evidence for diverticulitis.  Status post hysterectomy.  Trace free fluid within the pelvis is nonspecific.  Original Report Authenticated By: Waneta Martins, M.D.     1. Abdominal pain   2. Leg pain   3. Difficulty in walking       MDM  Patient was evaluated by myself. She denied any back pain. Based on location of her symptoms I did perform laboratory workup. This included CBC, renal panel, urinalysis, liver panel, and lipase. All of these were grossly normal. Patient did not 1 any pain medication. She still complained of pain on reassessment. Patient had CT of abdomen and pelvis performed. Trace free fluid was noted with no obvious cause of the patient's pain. On reassessment patient continued to have discomfort but stated that she felt better when she was lying still. She had no pain at that time. Patient attempted a trial of ambulation and was unable to walk and she stated that she had significant symptoms at this time. A repeat lactate was sent to ensure that there been no change in the patient's intra-abdominal status. She also had a plain film of the hip given the strange location of her symptoms. This is negative. Patient did not have any red flag neurologic symptoms. Given that she is unable to ambulate effectively she was admitted to the [facility] hospitalist service.        Cyndra Numbers, MD 07/26/11 [phone]

## 2011-07-26 NOTE — ED Notes (Signed)
Patient transported to CT and returned 

## 2011-07-27 LAB — CBC
Hemoglobin: 12.5 g/dL (ref 12.0–15.0)
MCH: 34.9 pg — ABNORMAL HIGH (ref 26.0–34.0)
MCHC: 34 g/dL (ref 30.0–36.0)
RDW: 14.7 % (ref 11.5–15.5)

## 2011-07-27 LAB — BASIC METABOLIC PANEL
Calcium: 9.1 mg/dL (ref 8.4–10.5)
GFR calc non Af Amer: 64 mL/min — ABNORMAL LOW (ref >90)
Glucose, Bld: 95 mg/dL (ref 70–99)
Sodium: 140 mEq/L (ref 135–145)

## 2011-07-27 LAB — URINE CULTURE: Organism ID, Bacteria: NO GROWTH

## 2011-07-27 MED ORDER — HYDROXYZINE HCL 25 MG PO TABS
25.0000 mg | ORAL_TABLET | Freq: Three times a day (TID) | ORAL | Status: DC | PRN
  Administered 2011-07-27: 25 mg via ORAL
  Filled 2011-07-27: qty 1

## 2011-07-27 MED ORDER — DIPHENHYDRAMINE HCL 25 MG PO CAPS
25.0000 mg | ORAL_CAPSULE | Freq: Four times a day (QID) | ORAL | Status: DC | PRN
  Administered 2011-07-27: 25 mg via ORAL
  Filled 2011-07-27: qty 1

## 2011-07-27 MED ORDER — METHYLPREDNISOLONE SODIUM SUCC 40 MG IJ SOLR
40.0000 mg | Freq: Once | INTRAMUSCULAR | Status: AC
  Administered 2011-07-27: 40 mg via INTRAVENOUS
  Filled 2011-07-27: qty 1

## 2011-07-27 MED ORDER — PREDNISONE 20 MG PO TABS
40.0000 mg | ORAL_TABLET | Freq: Every day | ORAL | Status: DC
  Administered 2011-07-28 – 2011-07-30 (×2): 40 mg via ORAL
  Filled 2011-07-27 (×3): qty 2

## 2011-07-27 NOTE — Evaluation (Signed)
Occupational Therapy Evaluation Patient Details Name: [name] MRN: [id number] DOB: Oct 10, 1940 Today's Date: 07/27/2011  Problem List:  Patient Active Problem List  Diagnoses  . HYPERLIPIDEMIA  . OBESITY  . ANXIETY  . HYPERTENSION  . CORONARY ARTERY DISEASE  . DIVERTICULOSIS OF COLON  . FATTY LIVER DISEASE  . LOW BACK PAIN, CHRONIC  . FIBROMYALGIA  . OSTEOPENIA  . SYNCOPE  . DIZZINESS  . TIA (transient ischemic attack)  . Abdominal pain, acute  . Low back pain radiating to right leg  . TIA on medication  . Fibromyalgia  . HTN (hypertension)  . Dyslipidemia  . CAD (coronary artery disease)    Past Medical History:  Past Medical History  Diagnosis Date  . Unspecified essential hypertension   . CAD (coronary artery disease)   . Other and unspecified hyperlipidemia   . Obesity, unspecified   . Diverticulosis of colon (without mention of hemorrhage)   . Other chronic nonalcoholic liver disease   . Myalgia and myositis, unspecified   . Disorder of bone and cartilage, unspecified   . Dizziness and giddiness   . Anxiety state, unspecified    Past Surgical History:  Past Surgical History  Procedure Date  . Tonsillectomy and adenoidectomy   . Total abdominal hysterectomy w/ bilateral salpingoophorectomy   . Cholecystectomy   . Back surgery   . Mohs surgery     OT Assessment/Plan/Recommendation OT Assessment Clinical Impression Statement: Pt presents w/new onset R flank pain. Adamantly denying pain in back or legs. Skilled OT recommended to maximize I w/BADLs for safe d/c home. OT Recommendation/Assessment: Patient will need skilled OT in the acute care venue OT Problem List: Decreased activity tolerance;Pain OT Therapy Diagnosis : Generalized weakness OT Plan OT Frequency: Min 1X/week OT Treatment/Interventions: Self-care/ADL training;Therapeutic activities;DME and/or AE instruction;Patient/family education OT Recommendation Follow Up Recommendations: No OT  follow up Equipment Recommended: None recommended by OT Individuals Consulted Consulted and Agree with Results and Recommendations: Patient;Family member/caregiver OT Goals Acute Rehab OT Goals OT Goal Formulation: With patient/family ADL Goals Pt Will Perform Grooming: with modified independence;Standing at sink;Other (comment) (X 3 tasks to improve standing activity tolerance.) ADL Goal: Grooming - Progress: Goal set today Pt Will Transfer to Toilet: with modified independence;Regular height toilet;Ambulation ADL Goal: Toilet Transfer - Progress: Goal set today Pt Will Perform Toileting - Clothing Manipulation: with modified independence;Standing ADL Goal: Toileting - Clothing Manipulation - Progress: Goal set today Pt Will Perform Toileting - Hygiene: with modified independence;Sit to stand from 3-in-1/toilet ADL Goal: Toileting - Hygiene - Progress: Goal set today Pt Will Perform Tub/Shower Transfer: Shower transfer;with modified independence;Ambulation ADL Goal: Tub/Shower Transfer - Progress: Goal set today  OT Evaluation Precautions/Restrictions  Precautions Precautions: Fall Required Braces or Orthoses: No Restrictions Weight Bearing Restrictions: No Prior Functioning Home Living Lives With: Spouse Receives Help From: Family Type of Home: House Home Layout: One level Home Access: Stairs to enter Entrance Stairs-Rails: None Entrance Stairs-Number of Steps: 1 Bathroom Shower/Tub: Health visitor: Handicapped height Bathroom Accessibility: Yes How Accessible: Accessible via walker Home Adaptive Equipment: Walker - rolling;Straight cane;Quad cane;Shower chair with back Prior Function Level of Independence: Independent with basic ADLs;Independent with transfers;Independent with homemaking with ambulation;Independent with gait Driving: Yes Vocation: Retired ADL ADL Grooming: Performed;Wash/dry hands;Supervision/safety Where Assessed - Grooming: Standing  at sink Upper Body Bathing: Simulated;Supervision/safety Where Assessed - Upper Body Bathing: Standing at sink Lower Body Bathing: Simulated;Supervision/safety Where Assessed - Lower Body Bathing: Sit to stand from bed Upper Body Dressing:  Simulated;Supervision/safety Where Assessed - Upper Body Dressing: Standing Lower Body Dressing: Performed;Supervision/safety Where Assessed - Lower Body Dressing: Lean right and/or left;Sitting, bed;Unsupported;Sit to stand from bed Toilet Transfer: Performed;Supervision/safety Toilet Transfer Method: Proofreader: Regular height toilet;Grab bars Toileting - Clothing Manipulation: Performed;Supervision/safety Where Assessed - Toileting Clothing Manipulation: Sit to stand from 3-in-1 or toilet Toileting - Hygiene: Performed;Supervision/safety Where Assessed - Toileting Hygiene: Sit on 3-in-1 or toilet Tub/Shower Transfer: Not assessed Tub/Shower Transfer Method: Not assessed Equipment Used: Rolling walker Ambulation Related to ADLs: Pt limited by pain in the R flank area. Used RW to stabilize self when ambulating to the bathroom. Vision/Perception    Cognition Cognition Arousal/Alertness: Awake/alert Overall Cognitive Status: Appears within functional limits for tasks assessed Orientation Level: Oriented X4 Sensation/Coordination   Extremity Assessment RUE Assessment RUE Assessment: Within Functional Limits LUE Assessment LUE Assessment: Within Functional Limits Mobility  Bed Mobility Supine to Sit: 5: Supervision;With rails;HOB flat Transfers Sit to Stand: 5: Supervision;From bed;From toilet;With upper extremity assist Stand to Sit: 5: Supervision;With upper extremity assist;To bed;To toilet Exercises   End of Session OT - End of Session Equipment Utilized During Treatment: Other (comment) (RW) Activity Tolerance: Patient limited by pain Patient left: in chair;with call bell in reach;with family/visitor  present General Behavior During Session: [facility] for tasks performed Cognition: [facility] for tasks performed   [name] A, OTR/L [phone] 07/27/2011, 3:53 PM

## 2011-07-27 NOTE — Progress Notes (Signed)
Subjective:  States she still has pain in her right lower abdomen that radiates down to her right leg. She denies nausea vomiting, chest pain and states she has had no back pain. Objective: Vital signs in last 24 hours: Temp:  [97.7 F (36.5 C)-99 F (37.2 C)] 97.7 F (36.5 C) (02/06 0546) Pulse Rate:  [57-66] 57  (02/06 0546) Resp:  [16-18] 16  (02/06 0546) BP: (111-129)/(67-76) 111/67 mmHg (02/06 0546) SpO2:  [92 %-96 %] 92 % (02/06 0546) Weight:  [89.812 kg (198 lb)] 89.812 kg (198 lb) (02/05 1427) Last BM Date: 07/26/11 Intake/Output from previous day: 02/05 0701 - 02/06 0700 In: 243 [P.O.:240; I.V.:3] Out: 502 [Urine:502] Intake/Output this shift:      General Appearance:    Alert, cooperative, no distress, obese   Lungs:     Clear to auscultation bilaterally, respirations unlabored   Heart:    Regular rate and rhythm, S1 and S2 normal, no murmur, rub   or gallop  Abdomen:     Soft, mild right lower quadrant tenderness, no rebound bowel sounds active all four quadrants,    no masses, no organomegaly  Extremities:   straight leg raise positive at about 30 no cyanosis and no edema  Neurologic:   CNII-XII intact, normal strength, sensation and reflexes    throughout    Weight change: 0 kg (0 lb)  Intake/Output Summary (Last 24 hours) at 07/27/11 1207 Last data filed at 07/26/11 2229  Gross per 24 hour  Intake    243 ml  Output    502 ml  Net   -259 ml    Lab Results:   Basename 07/27/11 0335 07/26/11 0151  NA 140 141  K 4.0 3.7  CL 104 107  CO2 28 25  GLUCOSE 95 106*  BUN 15 11  CREATININE 0.89 0.67  CALCIUM 9.1 9.1    Basename 07/27/11 0335 07/26/11 0151  WBC 3.7* 3.7*  HGB 12.5 12.0  HCT 36.8 34.9*  PLT 129* 141*  MCV 102.8* 101.5*   PT/INR No results found for this basename: LABPROT:2,INR:2 in the last 72 hours ABG No results found for this basename: PHART:2,PCO2:2,PO2:2,HCO3:2 in the last 72 hours  Micro Results: Recent Results (from the  past 240 hour(s))  URINE CULTURE     Status: Normal   Collection Time   07/25/11  2:37 PM      Component Value Range Status Comment   Colony Count NO GROWTH   Final    Organism ID, Bacteria NO GROWTH   Final    Studies/Results: Dg Hip Complete Right  07/26/2011  *RADIOLOGY REPORT*  Clinical Data: Right hip pain  RIGHT HIP - COMPLETE 2+ VIEW  Comparison: 07/26/2011 CT  Findings: No fracture or dislocation.  No aggressive osseous lesion.  Contrast opacifies loops of bowel and the bladder.  IMPRESSION: No acute osseous abnormality.  Original Report Authenticated By: Waneta Martins, M.D.   Mr Lumbar Spine W Wo Contrast  07/26/2011  *RADIOLOGY REPORT*  Clinical Data: 71 year old female status post lumbar surgery 2 years ago.  Low back pain radiating down the right side.  MRI LUMBAR SPINE WITHOUT AND WITH CONTRAST  Technique:  Multiplanar and multiecho pulse sequences of the lumbar spine were obtained without and with intravenous contrast.  Contrast: 18mL MULTIHANCE GADOBENATE DIMEGLUMINE 529 MG/ML IV SOLN  Comparison: CT abdomen pelvis 07/26/2011 and earlier.  Findings: Lumbar segmentation appears to be normal.  Stable visualized abdominal viscera.  There is trace pelvic free fluid  on the left re-identified.  No other inflammatory change in the visible pelvis.  Visualized paraspinal soft tissues are within normal limits. Visualized lower thoracic spinal cord is normal with conus medularis at L1.  Chronic degenerative endplate changes at L5-S1.  Normal vertebral height and alignment.  Mild chronic degenerative endplate changes at T11-T12. No marrow edema or evidence of acute osseous abnormality.  No abnormal intradural enhancement.  Cauda equina nerve roots within normal limits.  T11-T12:  Anterior disc bulge which will not cause neural compromise.  T12-L1:  Anterior disc bulge.  L1-L2:  Negative disc.  Mild to moderate facet hypertrophy.  No stenosis.  L2-L3:  Negative disc.  Moderate facet and ligament  flavum hypertrophy.  No stenosis.  L3-L4:  Negative disc.  Moderate facet and ligament flavum hypertrophy.  No stenosis.  L4-L5:  Circumferential disc bulge.  Superimposed small right paracentral disc protrusion with caudal migration of disc. Moderate to severe facet and ligament flavum hypertrophy.  Mild spinal stenosis.  Mild right lateral recess stenosis, in close proximity to the disc material.  Mild right L4 foraminal stenosis.  L5-S1:  Postoperative changes to the left lamina.  Moderate facet hypertrophy.  Small posteriorly situated synovial cyst on the left which will not cause neural compromise.  Circumferential disc osteophyte complex.  Postoperative changes to the left lateral recess.  No spinal or lateral recess stenosis.  There is severe left L5 foraminal stenosis.  There is minimal to mild right foraminal stenosis.  IMPRESSION: 1.  Multifactorial L4-L5 mild spinal stenosis, right lateral recess and right foraminal stenosis.  Small right paracentral disc protrusion contributes to these findings. 2.  Postoperative changes at L5-S1 on the left.  Chronic-appearing severe left L5 foraminal stenosis. 3.  Moderate lumbar facet degeneration elsewhere. 4.  Stable visualized abdominal and pelvic viscera from the CT earlier today, including nonspecific trace pelvic free fluid on the left.  Original Report Authenticated By: Harley Hallmark, M.D.   Ct Abdomen Pelvis W Contrast  07/26/2011  *RADIOLOGY REPORT*  Clinical Data: Abdominal pain  CT ABDOMEN AND PELVIS WITH CONTRAST  Technique:  Multidetector CT imaging of the abdomen and pelvis was performed following the standard protocol during bolus administration of intravenous contrast.  Contrast:  100 ml of Omnipaque-300  Comparison: 02/24/2009  Findings: Lung bases are clear.  Small hiatal hernia and distal esophageal wall thickening.  Round cystic density along the inferior margin of the right hepatic lobe is without significant interval change.  Diffuse low  attenuation suggests fatty infiltration.  Unremarkable spleen, pancreas, adrenal glands.  Biliary ductal prominence, with the CBD measuring up to 1 cm.  This is without significant interval change. Tapers to the level of the ampulla.  Lobular renal contours without focal abnormality.  No hydronephrosis or hydroureter.  Colonic diverticulosis.  No CT evidence for diverticulitis.  Normal appendix.  No bowel obstruction.  No free intraperitoneal air or fluid.  No lymphadenopathy.  Tiny fat containing umbilical hernia.  Thin-walled bladder.  Absent uterus.  No adnexal mass.  Trace free fluid within the pelvis.  There is scattered atherosclerotic calcification of the aorta and its branches. No aneurysmal dilatation.  Multilevel degenerative changes of the imaged spine. No acute or aggressive appearing osseous lesion.  IMPRESSION: No acute CT abnormality identified.  Low attenuation of the liver suggests fatty infiltration.  CBD prominence up to 1 cm.  Correlate with LFTs and ERCP or MRCP if warranted.  Colonic diverticulosis without CT evidence for diverticulitis.  Status post hysterectomy.  Trace free fluid within the pelvis is nonspecific.  Original Report Authenticated By: Waneta Martins, M.D.   Medications Scheduled Meds:   . calcium-vitamin D  1 tablet Oral Daily  . clopidogrel  75 mg Oral Q breakfast  . docusate sodium  100 mg Oral BID  . famotidine  20 mg Oral QHS  . furosemide  20 mg Oral Daily  . heparin  5,000 Units Subcutaneous Q8H  . methylPREDNISolone (SOLU-MEDROL) injection  40 mg Intravenous Once  . mulitivitamin with minerals  1 tablet Oral Daily  . pantoprazole  40 mg Oral Daily  . potassium chloride SA  20 mEq Oral BID  . predniSONE  40 mg Oral Q breakfast  . rosuvastatin  20 mg Oral q1800  . sodium chloride  3 mL Intravenous Q12H  . DISCONTD: traMADol  50 mg Oral TID   Continuous Infusions:  PRN Meds:.sodium chloride, acetaminophen, acetaminophen, ALPRAZolam, diphenhydrAMINE,  flyticasone, gadobenate dimeglumine, HYDROmorphone, hydrOXYzine, iohexol, ondansetron (ZOFRAN) IV, ondansetron, sodium chloride Assessment/Plan: Patient Active Hospital Problem List:  pain radiating to right leg (07/26/2011)-multifactorial L4-L5 stenosis with small right paracentral disc protrusion per MRI also Chronic-appearing severe left L5 foraminal stenosis.  -Will place patient on a steroid taper, await PT OT eval  -She states that she still has the right lower abdominal pain from admission but a CT scan of the abdomen and pelvis and is negative and she has no leukocytosis or fevers and is tolerating by mouth as well.    TIA on medication (07/26/2011) -Continue Plavix    Fibromyalgia (07/26/2011)   HTN (hypertension) (07/26/2011) -Continue outpatient medications, monitor and treat accordingly on a steroid started as above.    Dyslipidemia (07/26/2011) -Continue Crestor    CAD (coronary artery disease) (07/26/2011) She is chest pain-free, continue outpatient medications     LOS: 1 day   Mycah Formica C 07/27/2011, 12:07 PM

## 2011-07-27 NOTE — Progress Notes (Signed)
Pt continues to complain of itching.  Notified MD.  Received new orders.  Will continue to monitor pt. Daphene Calamity [facility] 07/27/2011 06:45AM

## 2011-07-28 ENCOUNTER — Telehealth: Payer: Self-pay | Admitting: Pulmonary Disease

## 2011-07-28 LAB — BASIC METABOLIC PANEL
BUN: 18 mg/dL (ref 6–23)
Creatinine, Ser: 0.85 mg/dL (ref 0.50–1.10)
GFR calc Af Amer: 79 mL/min — ABNORMAL LOW (ref >90)
GFR calc non Af Amer: 68 mL/min — ABNORMAL LOW (ref >90)
Potassium: 4.5 mEq/L (ref 3.5–5.1)

## 2011-07-28 NOTE — Telephone Encounter (Signed)
Called and spoke with pts daughter and explained to her that since the pt was seen in the ER and admitted by [facility] that they will have to follow the pt in the hospital unless they need a consult for pulmonary.  Selena Batten is aware that SN does get notified with information of the pt while in the hospital but he does not come to the hospital to see the pt since she is under the care of [facility] hopitalist.  [name] voiced her understanding of this.

## 2011-07-28 NOTE — Progress Notes (Signed)
Physical Therapy Treatment Patient Details Name: [name] MRN: [id number] DOB: Dec 14, 1940 Today's Date: 07/28/2011  PT Assessment/Plan  PT - Assessment/Plan Comments on Treatment Session: Pt able to tolerate an increase in ambulation distance with one hand hold assist.  Pt reports she has RW and cane at home but prefers not to have to use them.  Pt hopeful to d/c home today or tomorrow.  Educated pt to perform mobility at home as tolerated and with assistive device if increased pain present for safety. PT Plan: Discharge plan remains appropriate Follow Up Recommendations: Home health PT Equipment Recommended: None recommended by PT PT Goals  Acute Rehab PT Goals PT Goal: Supine/Side to Sit - Progress: Met PT Goal: Sit to Supine/Side - Progress: Met PT Goal: Sit to Stand - Progress: Met PT Goal: Stand to Sit - Progress: Met PT Goal: Ambulate - Progress: Progressing toward goal  PT Treatment Precautions/Restrictions  Precautions Precautions: Fall Required Braces or Orthoses: No Restrictions Weight Bearing Restrictions: No Mobility (including Balance) Bed Mobility Bed Mobility: Yes Supine to Sit: 6: Modified independent (Device/Increase time) Sit to Supine: 6: Modified independent (Device/Increase time) Transfers Transfers: Yes Sit to Stand: 5: Supervision;From bed;With upper extremity assist Stand to Sit: With upper extremity assist;5: Supervision;To bed Ambulation/Gait Ambulation/Gait: Yes Ambulation/Gait Assistance: 4: Min assist Ambulation/Gait Assistance Details (indicate cue type and reason): min/guard, pt preferred 1 HHA as she reports she would like to not need assistive device upon return home, pt reports increased pain in R anterior hip/groin area but reports it's better than last session (pt unable to describe pain, denies stabbing or shooting pain) Ambulation Distance (Feet): 160 Feet Assistive device: 1 person hand held assist Gait Pattern: Step-through  pattern;Antalgic    Exercise    End of Session PT - End of Session Activity Tolerance: Patient tolerated treatment well Patient left: in bed;with call bell in reach;with family/visitor present General Behavior During Session: [facility] for tasks performed Cognition: [facility] for tasks performed  Sircharles Holzheimer,KATHrine E 07/28/2011, 12:28 PM Pager: [phone]

## 2011-07-28 NOTE — Progress Notes (Signed)
Patient's daughter, Evaristo Bury (telephone number [phone]), expressed concern and voiced anger about her mother's care.  She feels like the physicians are not being aggressive enough with her mother's care and that they are not giving the family any answers as to what is happening with her mother.  Dr. Suanne Marker notified of this and she was given the daughter's phone number to call her.  Will follow up for any further assistance/support.  Allayne Butcher [facility]  07/28/2011  4:56 PM

## 2011-07-28 NOTE — Telephone Encounter (Signed)
Daughter calling back about same issue as previous message.

## 2011-07-28 NOTE — Progress Notes (Signed)
Talked to the patient about DCP; patient lives with spouse, continues to drive a car and states that she walks at home without any assisted device. Offered HHC services, patient declines, stated that she does not need it and will be fine. Abelino Derrick RN, BSN, MHA

## 2011-07-28 NOTE — Progress Notes (Signed)
Subjective:  States able to ambulate better using her walker, still with right lower abd pain, no n/v, tolerating po well. Spoke with daughter via phone, she is insistent on an abd [place] on pt Objective: Vital signs in last 24 hours: Temp:  [97.9 F (36.6 C)-98.5 F (36.9 C)] 97.9 F (36.6 C) (02/07 0550) Pulse Rate:  [62-66] 62  (02/07 0550) Resp:  [17-18] 17  (02/07 0550) BP: (104-134)/(51-69) 104/56 mmHg (02/07 0550) SpO2:  [93 %-97 %] 95 % (02/07 0550) Last BM Date: 07/26/11 Intake/Output from previous day: 02/06 0701 - 02/07 0700 In: 480 [P.O.:480] Out: 1300 [Urine:1300] Intake/Output this shift:      General Appearance:    Alert, cooperative, no distress, obese   Lungs:     Clear to auscultation bilaterally, respirations unlabored   Heart:    Regular rate and rhythm, S1 and S2 normal, no murmur, rub   or gallop  Abdomen:     Soft, mild right lower quadrant tenderness, no rebound, bowel sounds active all four quadrants,    no masses, no organomegaly  Extremities:    no edema  Neurologic:   CNII-XII intact, states sensory grossly intact       Weight change:   Intake/Output Summary (Last 24 hours) at 07/28/11 0852 Last data filed at 07/27/11 2100  Gross per 24 hour  Intake    480 ml  Output   1300 ml  Net   -820 ml    Lab Results:   [facility] 07/28/11 0319 07/27/11 0335  NA 137 140  K 4.5 4.0  CL 102 104  CO2 26 28  GLUCOSE 177* 95  BUN 18 15  CREATININE 0.85 0.89  CALCIUM 9.7 9.1    Basename 07/27/11 0335 07/26/11 0151  WBC 3.7* 3.7*  HGB 12.5 12.0  HCT 36.8 34.9*  PLT 129* 141*  MCV 102.8* 101.5*   PT/INR No results found for this basename: LABPROT:2,INR:2 in the last 72 hours ABG No results found for this basename: PHART:2,PCO2:2,PO2:2,HCO3:2 in the last 72 hours  Micro Results: Recent Results (from the past 240 hour(s))  URINE CULTURE     Status: Normal   Collection Time   07/25/11  2:37 PM      Component Value Range Status Comment   Colony  Count NO GROWTH   Final    Organism ID, Bacteria NO GROWTH   Final    Studies/Results: Dg Hip Complete Right  07/26/2011  *RADIOLOGY REPORT*  Clinical Data: Right hip pain  RIGHT HIP - COMPLETE 2+ VIEW  Comparison: 07/26/2011 CT  Findings: No fracture or dislocation.  No aggressive osseous lesion.  Contrast opacifies loops of bowel and the bladder.  IMPRESSION: No acute osseous abnormality.  Original Report Authenticated By: Waneta Martins, M.D.   Mr Lumbar Spine W Wo Contrast  07/26/2011  *RADIOLOGY REPORT*  Clinical Data: 71 year old female status post lumbar surgery 2 years ago.  Low back pain radiating down the right side.  MRI LUMBAR SPINE WITHOUT AND WITH CONTRAST  Technique:  Multiplanar and multiecho pulse sequences of the lumbar spine were obtained without and with intravenous contrast.  Contrast: 18mL MULTIHANCE GADOBENATE DIMEGLUMINE 529 MG/ML IV SOLN  Comparison: CT abdomen pelvis 07/26/2011 and earlier.  Findings: Lumbar segmentation appears to be normal.  Stable visualized abdominal viscera.  There is trace pelvic free fluid on the left re-identified.  No other inflammatory change in the visible pelvis.  Visualized paraspinal soft tissues are within normal limits. Visualized lower thoracic spinal  cord is normal with conus medularis at L1.  Chronic degenerative endplate changes at L5-S1.  Normal vertebral height and alignment.  Mild chronic degenerative endplate changes at T11-T12. No marrow edema or evidence of acute osseous abnormality.  No abnormal intradural enhancement.  Cauda equina nerve roots within normal limits.  T11-T12:  Anterior disc bulge which will not cause neural compromise.  T12-L1:  Anterior disc bulge.  L1-L2:  Negative disc.  Mild to moderate facet hypertrophy.  No stenosis.  L2-L3:  Negative disc.  Moderate facet and ligament flavum hypertrophy.  No stenosis.  L3-L4:  Negative disc.  Moderate facet and ligament flavum hypertrophy.  No stenosis.  L4-L5:  Circumferential  disc bulge.  Superimposed small right paracentral disc protrusion with caudal migration of disc. Moderate to severe facet and ligament flavum hypertrophy.  Mild spinal stenosis.  Mild right lateral recess stenosis, in close proximity to the disc material.  Mild right L4 foraminal stenosis.  L5-S1:  Postoperative changes to the left lamina.  Moderate facet hypertrophy.  Small posteriorly situated synovial cyst on the left which will not cause neural compromise.  Circumferential disc osteophyte complex.  Postoperative changes to the left lateral recess.  No spinal or lateral recess stenosis.  There is severe left L5 foraminal stenosis.  There is minimal to mild right foraminal stenosis.  IMPRESSION: 1.  Multifactorial L4-L5 mild spinal stenosis, right lateral recess and right foraminal stenosis.  Small right paracentral disc protrusion contributes to these findings. 2.  Postoperative changes at L5-S1 on the left.  Chronic-appearing severe left L5 foraminal stenosis. 3.  Moderate lumbar facet degeneration elsewhere. 4.  Stable visualized abdominal and pelvic viscera from the CT earlier today, including nonspecific trace pelvic free fluid on the left.  Original Report Authenticated By: Harley Hallmark, M.D.   Ct Abdomen Pelvis W Contrast  07/26/2011  *RADIOLOGY REPORT*  Clinical Data: Abdominal pain  CT ABDOMEN AND PELVIS WITH CONTRAST  Technique:  Multidetector CT imaging of the abdomen and pelvis was performed following the standard protocol during bolus administration of intravenous contrast.  Contrast:  100 ml of Omnipaque-300  Comparison: 02/24/2009  Findings: Lung bases are clear.  Small hiatal hernia and distal esophageal wall thickening.  Round cystic density along the inferior margin of the right hepatic lobe is without significant interval change.  Diffuse low attenuation suggests fatty infiltration.  Unremarkable spleen, pancreas, adrenal glands.  Biliary ductal prominence, with the CBD measuring up to 1 cm.   This is without significant interval change. Tapers to the level of the ampulla.  Lobular renal contours without focal abnormality.  No hydronephrosis or hydroureter.  Colonic diverticulosis.  No CT evidence for diverticulitis.  Normal appendix.  No bowel obstruction.  No free intraperitoneal air or fluid.  No lymphadenopathy.  Tiny fat containing umbilical hernia.  Thin-walled bladder.  Absent uterus.  No adnexal mass.  Trace free fluid within the pelvis.  There is scattered atherosclerotic calcification of the aorta and its branches. No aneurysmal dilatation.  Multilevel degenerative changes of the imaged spine. No acute or aggressive appearing osseous lesion.  IMPRESSION: No acute CT abnormality identified.  Low attenuation of the liver suggests fatty infiltration.  CBD prominence up to 1 cm.  Correlate with LFTs and ERCP or MRCP if warranted.  Colonic diverticulosis without CT evidence for diverticulitis.  Status post hysterectomy.  Trace free fluid within the pelvis is nonspecific.  Original Report Authenticated By: Waneta Martins, M.D.   Medications Scheduled Meds:    .  calcium-vitamin D  1 tablet Oral Daily  . clopidogrel  75 mg Oral Q breakfast  . docusate sodium  100 mg Oral BID  . famotidine  20 mg Oral QHS  . furosemide  20 mg Oral Daily  . heparin  5,000 Units Subcutaneous Q8H  . methylPREDNISolone (SOLU-MEDROL) injection  40 mg Intravenous Once  . mulitivitamin with minerals  1 tablet Oral Daily  . pantoprazole  40 mg Oral Daily  . potassium chloride SA  20 mEq Oral BID  . predniSONE  40 mg Oral Q breakfast  . rosuvastatin  20 mg Oral q1800  . sodium chloride  3 mL Intravenous Q12H   Continuous Infusions:  PRN Meds:.sodium chloride, acetaminophen, acetaminophen, ALPRAZolam, diphenhydrAMINE, flyticasone, HYDROmorphone, hydrOXYzine, ondansetron (ZOFRAN) IV, ondansetron, sodium chloride Assessment/Plan: Patient Active Hospital Problem List:  pain radiating to right leg  (07/26/2011)-multifactorial L4-L5 stenosis with small right paracentral disc protrusion per MRI also  Pt ambulating better, PT OT - recommended HH but pt declines - continue prednisone, follow and taper -She states that she still has the right lower abdominal pain from admission but a CT scan of the abdomen and pelvis and is negative and she has no leukocytosis or fevers and is tolerating by mouth as well.  -will obtain abd [place] abd follow   TIA on medication (07/26/2011) -Continue Plavix    Fibromyalgia (07/26/2011)   HTN (hypertension) (07/26/2011) -Continue outpatient medications, monitor and treat accordingly on a steroid started as above.    Dyslipidemia (07/26/2011) -Continue Crestor    CAD (coronary artery disease) (07/26/2011) She is chest pain-free, continue outpatient medications     LOS: 2 days   Josian Lanese C 07/28/2011, 8:52 AM

## 2011-07-29 ENCOUNTER — Observation Stay (HOSPITAL_COMMUNITY): Payer: Medicare Other

## 2011-07-29 DIAGNOSIS — R109 Unspecified abdominal pain: Secondary | ICD-10-CM

## 2011-07-29 LAB — CBC
Hemoglobin: 14.1 g/dL (ref 12.0–15.0)
MCHC: 34.3 g/dL (ref 30.0–36.0)
Platelets: 164 10*3/uL (ref 150–400)
RDW: 14.3 % (ref 11.5–15.5)

## 2011-07-29 LAB — LACTIC ACID, PLASMA: Lactic Acid, Venous: 2 mmol/L (ref 0.5–2.2)

## 2011-07-29 MED ORDER — HYDROMORPHONE HCL 2 MG PO TABS
1.0000 mg | ORAL_TABLET | ORAL | Status: DC | PRN
  Administered 2011-07-29: 1 mg via ORAL
  Filled 2011-07-29 (×2): qty 1

## 2011-07-29 NOTE — Progress Notes (Signed)
Subjective:  States feeling better, no further leg pain or weakness. Still with pain in right lower abd area, mostly with walking.  Objective: Vital signs in last 24 hours: Temp:  [98.2 F (36.8 C)-98.4 F (36.9 C)] 98.2 F (36.8 C) (02/08 1416) Pulse Rate:  [55-69] 65  (02/08 1416) Resp:  [18] 18  (02/08 1416) BP: (110-151)/(69-76) 110/69 mmHg (02/08 1416) SpO2:  [96 %-97 %] 96 % (02/08 1416) Last BM Date: 07/29/11 Intake/Output from previous day: 02/07 0701 - 02/08 0700 In: 300 [P.O.:300] Out: -  Intake/Output this shift:      General Appearance:    Alert, cooperative, no distress, obese   Lungs:     Clear to auscultation bilaterally, respirations unlabored   Heart:    Regular rate and rhythm, S1 and S2 normal, no murmur, rub   or gallop  Abdomen:     Soft, mild right lower quadrant tenderness- closer to grooin area, no rebound, bowel sounds active all four quadrants,    no masses, no organomegaly  Extremities:    no edema  Neurologic:   CNII-XII intact, states sensory grossly intact       Weight change:  No intake or output data in the 24 hours ending 07/29/11 1902  Lab Results:   Basename 07/28/11 0319 07/27/11 0335  NA 137 140  K 4.5 4.0  CL 102 104  CO2 26 28  GLUCOSE 177* 95  BUN 18 15  CREATININE 0.85 0.89  CALCIUM 9.7 9.1    Basename 07/29/11 0333 07/27/11 0335  WBC 8.8 3.7*  HGB 14.1 12.5  HCT 41.1 36.8  PLT 164 129*  MCV 101.5* 102.8*   PT/INR No results found for this basename: LABPROT:2,INR:2 in the last 72 hours ABG No results found for this basename: PHART:2,PCO2:2,PO2:2,HCO3:2 in the last 72 hours  Micro Results: Recent Results (from the past 240 hour(s))  URINE CULTURE     Status: Normal   Collection Time   07/25/11  2:37 PM      Component Value Range Status Comment   Colony Count NO GROWTH   Final    Organism ID, Bacteria NO GROWTH   Final    Studies/Results: Dg Hip Complete Right  07/26/2011  *RADIOLOGY REPORT*  Clinical Data:  Right hip pain  RIGHT HIP - COMPLETE 2+ VIEW  Comparison: 07/26/2011 CT  Findings: No fracture or dislocation.  No aggressive osseous lesion.  Contrast opacifies loops of bowel and the bladder.  IMPRESSION: No acute osseous abnormality.  Original Report Authenticated By: Waneta Martins, M.D.   Mr Lumbar Spine W Wo Contrast  07/26/2011  *RADIOLOGY REPORT*  Clinical Data: 71 year old female status post lumbar surgery 2 years ago.  Low back pain radiating down the right side.  MRI LUMBAR SPINE WITHOUT AND WITH CONTRAST  Technique:  Multiplanar and multiecho pulse sequences of the lumbar spine were obtained without and with intravenous contrast.  Contrast: 18mL MULTIHANCE GADOBENATE DIMEGLUMINE 529 MG/ML IV SOLN  Comparison: CT abdomen pelvis 07/26/2011 and earlier.  Findings: Lumbar segmentation appears to be normal.  Stable visualized abdominal viscera.  There is trace pelvic free fluid on the left re-identified.  No other inflammatory change in the visible pelvis.  Visualized paraspinal soft tissues are within normal limits. Visualized lower thoracic spinal cord is normal with conus medularis at L1.  Chronic degenerative endplate changes at L5-S1.  Normal vertebral height and alignment.  Mild chronic degenerative endplate changes at T11-T12. No marrow edema or evidence of acute osseous  abnormality.  No abnormal intradural enhancement.  Cauda equina nerve roots within normal limits.  T11-T12:  Anterior disc bulge which will not cause neural compromise.  T12-L1:  Anterior disc bulge.  L1-L2:  Negative disc.  Mild to moderate facet hypertrophy.  No stenosis.  L2-L3:  Negative disc.  Moderate facet and ligament flavum hypertrophy.  No stenosis.  L3-L4:  Negative disc.  Moderate facet and ligament flavum hypertrophy.  No stenosis.  L4-L5:  Circumferential disc bulge.  Superimposed small right paracentral disc protrusion with caudal migration of disc. Moderate to severe facet and ligament flavum hypertrophy.  Mild  spinal stenosis.  Mild right lateral recess stenosis, in close proximity to the disc material.  Mild right L4 foraminal stenosis.  L5-S1:  Postoperative changes to the left lamina.  Moderate facet hypertrophy.  Small posteriorly situated synovial cyst on the left which will not cause neural compromise.  Circumferential disc osteophyte complex.  Postoperative changes to the left lateral recess.  No spinal or lateral recess stenosis.  There is severe left L5 foraminal stenosis.  There is minimal to mild right foraminal stenosis.  IMPRESSION: 1.  Multifactorial L4-L5 mild spinal stenosis, right lateral recess and right foraminal stenosis.  Small right paracentral disc protrusion contributes to these findings. 2.  Postoperative changes at L5-S1 on the left.  Chronic-appearing severe left L5 foraminal stenosis. 3.  Moderate lumbar facet degeneration elsewhere. 4.  Stable visualized abdominal and pelvic viscera from the CT earlier today, including nonspecific trace pelvic free fluid on the left.  Original Report Authenticated By: Harley Hallmark, M.D.   Ct Abdomen Pelvis W Contrast  07/26/2011  *RADIOLOGY REPORT*  Clinical Data: Abdominal pain  CT ABDOMEN AND PELVIS WITH CONTRAST  Technique:  Multidetector CT imaging of the abdomen and pelvis was performed following the standard protocol during bolus administration of intravenous contrast.  Contrast:  100 ml of Omnipaque-300  Comparison: 02/24/2009  Findings: Lung bases are clear.  Small hiatal hernia and distal esophageal wall thickening.  Round cystic density along the inferior margin of the right hepatic lobe is without significant interval change.  Diffuse low attenuation suggests fatty infiltration.  Unremarkable spleen, pancreas, adrenal glands.  Biliary ductal prominence, with the CBD measuring up to 1 cm.  This is without significant interval change. Tapers to the level of the ampulla.  Lobular renal contours without focal abnormality.  No hydronephrosis or  hydroureter.  Colonic diverticulosis.  No CT evidence for diverticulitis.  Normal appendix.  No bowel obstruction.  No free intraperitoneal air or fluid.  No lymphadenopathy.  Tiny fat containing umbilical hernia.  Thin-walled bladder.  Absent uterus.  No adnexal mass.  Trace free fluid within the pelvis.  There is scattered atherosclerotic calcification of the aorta and its branches. No aneurysmal dilatation.  Multilevel degenerative changes of the imaged spine. No acute or aggressive appearing osseous lesion.  IMPRESSION: No acute CT abnormality identified.  Low attenuation of the liver suggests fatty infiltration.  CBD prominence up to 1 cm.  Correlate with LFTs and ERCP or MRCP if warranted.  Colonic diverticulosis without CT evidence for diverticulitis.  Status post hysterectomy.  Trace free fluid within the pelvis is nonspecific.  Original Report Authenticated By: Waneta Martins, M.D.    ABD [place] - no acute findings  Medications Scheduled Meds:    . calcium-vitamin D  1 tablet Oral Daily  . clopidogrel  75 mg Oral Q breakfast  . docusate sodium  100 mg Oral BID  . furosemide  20 mg Oral Daily  . heparin  5,000 Units Subcutaneous Q8H  . mulitivitamin with minerals  1 tablet Oral Daily  . pantoprazole  40 mg Oral Daily  . potassium chloride SA  20 mEq Oral BID  . predniSONE  40 mg Oral Q breakfast  . rosuvastatin  20 mg Oral q1800  . sodium chloride  3 mL Intravenous Q12H   Continuous Infusions:  PRN Meds:.sodium chloride, acetaminophen, acetaminophen, ALPRAZolam, diphenhydrAMINE, flyticasone, HYDROmorphone, HYDROmorphone, hydrOXYzine, ondansetron (ZOFRAN) IV, ondansetron, sodium chloride Assessment/Plan: Patient Active Hospital Problem List:  pain radiating to right leg (07/26/2011)-multifactorial L4-L5 stenosis with small right paracentral disc protrusion per MRI also  Pt ambulating better, PT OT - recommended HH but pt declines - continue prednisone, follow and taper - clinically  much improved -She states that she still has the right lower abdominal pain from admission but a CT scan of the abdomen and pelvis and is negative and she has no leukocytosis or fevers and is tolerating by mouth as well.  -abd [place] -neg, I have consulted GI for further recs and awaiting GI attending final recs.   TIA on medication (07/26/2011) -Continue Plavix    Fibromyalgia (07/26/2011)   HTN (hypertension) (07/26/2011) -Continue outpatient medications, monitor and treat accordingly on a steroid started as above.    Dyslipidemia (07/26/2011) -Continue Crestor    CAD (coronary artery disease) (07/26/2011) She is chest pain-free, continue outpatient medications     LOS: 3 days   Leah Thornberry C 07/29/2011, 7:01 PM

## 2011-07-29 NOTE — Consult Note (Signed)
[facility] Consultation  Referring Provider:  [facility] Primary Care Physician:  Michele Mcalpine, MD, MD Primary Gastroenterologist:   Einar Pheasant Reason for Consultation:  abdominal pain  HPI: [name] is a 71 y.o. female with mulitiple medical problems on Plavix, admitted with RLQ pain and RLE pain radiating down back of leg. She was having difficulty ambulating secondary to the pain. Patient saw PCP on 07/25/11 for the abdominal pain which was described as intermittent for two weeks but worse over last few days. Her CBC and UA were okay. Contrast CTscan of the abdomen and pelvis was unrevealing. Her CBD was 1cm but that was possibly incidental finding. MRI of lumbar spine showed multifactorial L4-L5 mild spinal stenosis, right lateral recess and right foraminal stenosis. Small right paracentral disc protrusion seen. She was started on Prednisone and leg pain has improved but abdominal pain has not. Pain present only when walking. BMs are normal.    Past Medical History  Diagnosis Date  . Unspecified essential hypertension   . CAD (coronary artery disease)   . Other and unspecified hyperlipidemia   . Obesity, unspecified   . Diverticulosis of colon (without mention of hemorrhage)   . Other chronic nonalcoholic liver disease   . Myalgia and myositis, unspecified   . Disorder of bone and cartilage, unspecified   . Dizziness and giddiness   . Anxiety state, unspecified     Past Surgical History  Procedure Date  . Tonsillectomy and adenoidectomy   . Total abdominal hysterectomy w/ bilateral salpingoophorectomy   . Cholecystectomy   . Back surgery   . Mohs surgery     Prior to Admission medications   Medication Sig Start Date End Date Taking? Authorizing Provider  ALPRAZolam Prudy Feeler) 0.5 MG tablet Take 0.25-0.5 mg by mouth 3 (three) times daily as needed. For anxiety   Yes Historical Provider, MD  atorvastatin (LIPITOR) 20 MG tablet Take 1 tablet (20 mg  total) by mouth daily. 07/18/11 07/17/12 Yes Michele Mcalpine, MD  Calcium Carbonate-Vitamin D (CALTRATE 600+D) 600-400 MG-UNIT per tablet Take 1 tablet by mouth daily.     Yes Historical Provider, MD  clopidogrel (PLAVIX) 75 MG tablet Take 1 tablet (75 mg total) by mouth daily with breakfast. 07/18/11 07/17/12 Yes Michele Mcalpine, MD  Coenzyme Q10 (CO Q 10) 100 MG CAPS Take 1 capsule by mouth daily. 07/14/11  Yes Ripudeep Jenna Luo, MD  famotidine (PEPCID) 20 MG tablet Take 1 tablet (20 mg total) by mouth at bedtime. 07/19/11 07/18/12 Yes Michele Mcalpine, MD  fluticasone (CUTIVATE) 0.05 % cream Apply 1 application topically as needed. Apply to toe 06/28/11  Yes Historical Provider, MD  furosemide (LASIX) 20 MG tablet Take 20 mg by mouth daily.     Yes Historical Provider, MD  Multiple Vitamin (MULTIVITAMIN) capsule Take 1 capsule by mouth daily.     Yes Historical Provider, MD  pantoprazole (PROTONIX) 40 MG tablet Take 1 tablet (40 mg total) by mouth daily. 30 minutes prior to a meal 07/19/11 07/18/12 Yes Scott Elayne Snare, MD  potassium chloride SA (K-DUR,KLOR-CON) 20 MEQ tablet Take 20 mEq by mouth 2 (two) times daily.   Yes Historical Provider, MD    Current Facility-Administered Medications  Medication Dose Route Frequency Provider Last Rate Last Dose  . 0.9 %  sodium chloride infusion  250 mL Intravenous PRN Isidor Holts, MD      . acetaminophen (TYLENOL) tablet 650 mg  650 mg Oral Q6H PRN Isidor Holts, MD  650 mg at 07/29/11 0416   Or  . acetaminophen (TYLENOL) suppository 650 mg  650 mg Rectal Q6H PRN Isidor Holts, MD      . ALPRAZolam Prudy Feeler) tablet 0.25-0.5 mg  0.25-0.5 mg Oral TID PRN Isidor Holts, MD   0.5 mg at 07/26/11 2240  . calcium-vitamin D (OSCAL WITH D) 500-200 MG-UNIT per tablet 1 tablet  1 tablet Oral Daily Isidor Holts, MD   1 tablet at 07/29/11 1100  . clopidogrel (PLAVIX) tablet 75 mg  75 mg Oral Q breakfast Isidor Holts, MD   75 mg at 07/28/11 9604  . diphenhydrAMINE  (BENADRYL) capsule 25 mg  25 mg Oral Q6H PRN Isidor Holts, MD   25 mg at 07/27/11 0332  . docusate sodium (COLACE) capsule 100 mg  100 mg Oral BID Isidor Holts, MD   100 mg at 07/29/11 1100  . flyticasone (CUTIVATE) 0.005 % ointment   Topical PRN Isidor Holts, MD      . furosemide (LASIX) tablet 20 mg  20 mg Oral Daily Isidor Holts, MD   20 mg at 07/29/11 1100  . heparin injection 5,000 Units  5,000 Units Subcutaneous Q8H Isidor Holts, MD   5,000 Units at 07/29/11 1510  . HYDROmorphone (DILAUDID) injection 0.5 mg  0.5 mg Intravenous Q4H PRN Isidor Holts, MD      . HYDROmorphone (DILAUDID) tablet 1 mg  1 mg Oral Q4H PRN Kela Millin, MD   1 mg at 07/29/11 1511  . hydrOXYzine (ATARAX/VISTARIL) tablet 25 mg  25 mg Oral TID PRN Isidor Holts, MD   25 mg at 07/27/11 0815  . mulitivitamin with minerals tablet 1 tablet  1 tablet Oral Daily Isidor Holts, MD   1 tablet at 07/29/11 1100  . ondansetron (ZOFRAN) tablet 4 mg  4 mg Oral Q6H PRN Isidor Holts, MD       Or  . ondansetron (ZOFRAN) injection 4 mg  4 mg Intravenous Q6H PRN Isidor Holts, MD      . pantoprazole (PROTONIX) EC tablet 40 mg  40 mg Oral Daily Isidor Holts, MD   40 mg at 07/29/11 1100  . potassium chloride SA (K-DUR,KLOR-CON) CR tablet 20 mEq  20 mEq Oral BID Isidor Holts, MD   20 mEq at 07/29/11 1103  . predniSONE (DELTASONE) tablet 40 mg  40 mg Oral Q breakfast Kela Millin, MD   40 mg at 07/28/11 0939  . rosuvastatin (CRESTOR) tablet 20 mg  20 mg Oral q1800 Isidor Holts, MD   20 mg at 07/28/11 1809  . sodium chloride 0.9 % injection 3 mL  3 mL Intravenous Q12H Isidor Holts, MD   3 mL at 07/29/11 1510  . sodium chloride 0.9 % injection 3 mL  3 mL Intravenous PRN Isidor Holts, MD   3 mL at 07/29/11 1100  . DISCONTD: famotidine (PEPCID) tablet 20 mg  20 mg Oral QHS Isidor Holts, MD   20 mg at 07/27/11 2204    Allergies as of 07/26/2011 - Review Complete 07/26/2011  Allergen Reaction  Noted  . Codeine    . Diltiazem hcl    . Sulfonamide derivatives      Family History  Problem Relation Age of Onset  . Stomach cancer Mother   . Cancer Brother   . Breast cancer Sister   . Diabetes      History   Social History  . Marital Status: Married    Spouse Name: Viona Gilmore. Menta Sr.    Number  of Children: 4  . Years of Education: N/A   Occupational History  .     Social History Main Topics  . Smoking status: Never Smoker   . Smokeless tobacco: Never Used  . Alcohol Use: No  . Drug Use: No  . Sexually Active: Not on file   Other Topics Concern  . Not on file   Social History Narrative  . No narrative on file    Review of Systems: All systems reviewed and negative except where noted in HPI  PHYSICAL EXAM: Vital signs in last 24 hours: Temp:  [98.2 F (36.8 C)-98.4 F (36.9 C)] 98.2 F (36.8 C) (02/08 1416) Pulse Rate:  [55-69] 65  (02/08 1416) Resp:  [18] 18  (02/08 1416) BP: (110-151)/(69-76) 110/69 mmHg (02/08 1416) SpO2:  [96 %-97 %] 96 % (02/08 1416) Last BM Date: 07/29/11 General:   Pleasant white female in NAD Head:  Normocephalic and atraumatic. Eyes:   No icterus.   Conjunctiva pink. Ears:  Normal auditory acuity. Neck:  Supple; no masses felt Lungs:  Respirations even and unlabored. Lungs clear to auscultation bilaterally.   No wheezes, crackles, or rhonchi.  Heart:  Regular rate and rhythm Abdomen:  Soft, nondistended, nontender. Normal bowel sounds. No appreciable masses or hepatomegaly. Negative Psoas test.  Rectal:  Not performed.  Msk:  Symmetrical without gross deformities. Her LLQ pain reproduced when she stood up and walked (as she moved right leg forward) though right leg lift did not reproduce the pain.  Extremities:  Without edema. Neurologic:  Alert and  oriented x4;  grossly normal neurologically. Skin:  Intact without significant lesions or rashes. Cervical Nodes:  No significant cervical adenopathy. Psych:  Alert and  cooperative. Normal affect.  LAB RESULTS:  Basename 07/29/11 0333 07/27/11 0335  WBC 8.8 3.7*  HGB 14.1 12.5  HCT 41.1 36.8  PLT 164 129*   BMET  Basename 07/28/11 0319 07/27/11 0335  NA 137 140  K 4.5 4.0  CL 102 104  CO2 26 28  GLUCOSE 177* 95  BUN 18 15  CREATININE 0.85 0.89  CALCIUM 9.7 9.1   STUDIES: US Abdomen Complete  07/29/2011  *RADIOLOGY REPORT*  Clinical Data:  Elevated LFTs, abdominal pain  ULTRASOUND ABDOMEN:  Technique:  Sonography of upper abdominal structures was performed. Image quality degraded secondary to body habitus.  Comparison:  None Correlation:  CT abdomen and pelvis 07/26/2011  Gallbladder:  Surgically absent  Common bile duct:  13 mm diameter, prominent for post cholecystectomy.  Liver:  Coarsened increased echogenicity question fatty infiltration versus cirrhosis.  Suboptimal assessment of intrahepatic detail due to poor sound transmission.  No gross abnormality identified, but intrahepatic pathology is not excluded.  IVC:  Grossly normal  Pancreas:  Appears echogenic question fatty replacement. Suboptimally visualized at head and tail.  Spleen:  Normal appearance, 7.7 cm length.  Right kidney:  9.6 cm length. Normal morphology without mass or hydronephrosis.  Left kidney:  9.3 cm length.  Cortical thinning.  No gross mass or hydronephrosis.  Aorta:  Normal caliber  Other:  No free fluid  IMPRESSION: Question fatty infiltration of liver versus cirrhosis. Biliary dilatation, CBD 13 mm diameter, slightly prominent for post cholecystectomy; if patient's LFTs remain elevated, then consider MR imaging using MRCP technique to further evaluate. No other upper abdominal sonographic abnormalities identified. Incomplete pancreatic and intra-hepatic visualization, better assessed on recent CT.  Original Report Authenticated By: Lollie Marrow, M.D.   PREVIOUS ENDOSCOPIES: colonoscopy 2009 Juanda Chance) -  diverticulosis  IMPRESSION / PLAN: 1. Very pleasant 71 year old white  female with acute RLQ pain and also RLE pain which radiates down right leg. Spinal stenosis on MRI of lumbar spine. RLE pain better with steroids. Her RLQ pain is only present when she walks and she was able to reproduce the pain when walking. A right leg lift was didn't reproduce the pain and psoas test was negative. Her pain definitely seem musculoskeletal in nature as it is only present with walking and unrelated to meals or defecation. Of note, patient really wants to go home as soon as possible. A family member is having twins and baby shower is tomorrow.  2. Multiple medical problems as listed in PMH 3. Chronic anti-platelet therapy.  4. CBD dilation, 1cm. She has had a cholecystectomy, still may be a little prominent. No abdominal pain. She has had mild transaminitis but has steatosis on CTscan. This can be evaluated as outpatient.  5. Distal esophageal wall thickening. No dysphagia. This could be a hiatal hernia or esophagitis. Neoplasm less likely. Workup can be done outpatient. Patient is on a daily PPI at home.    Thanks   LOS: 3 days   Willette Cluster  07/29/2011, 4:37 PM

## 2011-07-29 NOTE — Consult Note (Signed)
Patient seen, examined,  and I agree with the above documentation, including the assessment and plan. Agree that right groin pain likely musculoskeletal in nature.   Pt should have GI followup with Dr. Juanda Chance after d/c to discuss mild LFTs elevation and distal esophageal thickening. She is anxious for d/c now, and workup can resume as outpt.

## 2011-07-30 MED ORDER — PREDNISONE (PAK) 10 MG PO TABS: 10.0000 mg | ORAL_TABLET | Freq: Every day | ORAL | Status: AC

## 2011-07-30 NOTE — Progress Notes (Signed)
Pt d/ced home this am. Gave d/c and home med instructions, pt verbalized understanding.

## 2011-07-30 NOTE — Discharge Summary (Signed)
Discharge Note  Name: [name] MRN: [id number] DOB: 1941-02-22 71 y.o.  Date of Admission: 07/26/2011 12:15 AM Date of Discharge: 07/30/2011 Attending Physician: Isidor Holts, MD  Discharge Diagnosis: Principal Problem:  *L4-5 right paracentral disc protrusion (small) with pain radiating to right leg Active Problems:  TIA on medication  Fibromyalgia  HTN (hypertension)  Dyslipidemia  CAD (coronary artery disease)   Discharge Medications: Medication List  As of 07/30/2011 11:28 AM   STOP taking these medications         multivitamin capsule         TAKE these medications         ALPRAZolam 0.5 MG tablet   Commonly known as: XANAX   Take 0.25-0.5 mg by mouth 3 (three) times daily as needed. For anxiety      atorvastatin 20 MG tablet   Commonly known as: LIPITOR   Take 1 tablet (20 mg total) by mouth daily.      CALTRATE 600+D 600-400 MG-UNIT per tablet   Generic drug: Calcium Carbonate-Vitamin D   Take 1 tablet by mouth daily.      clopidogrel 75 MG tablet   Commonly known as: PLAVIX   Take 1 tablet (75 mg total) by mouth daily with breakfast.      Co Q 10 100 MG Caps   Take 1 capsule by mouth daily.      famotidine 20 MG tablet   Commonly known as: PEPCID   Take 1 tablet (20 mg total) by mouth at bedtime.      fluticasone 0.05 % cream   Commonly known as: CUTIVATE   Apply 1 application topically as needed. Apply to toe      furosemide 20 MG tablet   Commonly known as: LASIX   Take 20 mg by mouth daily.      pantoprazole 40 MG tablet   Commonly known as: PROTONIX   Take 1 tablet (40 mg total) by mouth daily. 30 minutes prior to a meal      potassium chloride SA 20 MEQ tablet   Commonly known as: K-DUR,KLOR-CON   Take 20 mEq by mouth 2 (two) times daily.      predniSONE 10 MG tablet   Commonly known as: STERAPRED UNI-PAK   Take 1 tablet (10 mg total) by mouth daily. Take 3 tablets daily for 3 days, then 2 tablets daily for 3 days, then 1 tablet  daily for 3 days and then stop.            Disposition and follow-up:   [name] was discharged from [facility] in improved/stable condition.    Follow-up Appointments: Discharge Orders    Future Appointments: Provider: Department: Dept Phone: Center:   08/16/2011 9:15 AM Hart Carwin, MD [facility] [place] [facility] [phone] [facility]   10/17/2011 11:00 AM Michele Mcalpine, MD [facility] [phone] None   01/04/2012 10:30 AM Michele Mcalpine, MD [facility] [phone] None     Future Orders Please Complete By Expires   Diet - low sodium heart healthy      Increase activity slowly         Consultations: Treatment Team:  Erick Blinks, MD  Procedures Performed:  Dg Chest 2 View  07/13/2011  *RADIOLOGY REPORT*  Clinical Data: Chest pain, headache.  CHEST - 2 VIEW  Comparison: 09/25/2006  Findings: Heart and mediastinal contours are within normal limits. No focal opacities or effusions.  No acute bony abnormality.  IMPRESSION:  No active cardiopulmonary disease.  Original Report Authenticated By: Cyndie Chime, M.D.   Dg Hip Complete Right  07/26/2011  *RADIOLOGY REPORT*  Clinical Data: Right hip pain  RIGHT HIP - COMPLETE 2+ VIEW  Comparison: 07/26/2011 CT  Findings: No fracture or dislocation.  No aggressive osseous lesion.  Contrast opacifies loops of bowel and the bladder.  IMPRESSION: No acute osseous abnormality.  Original Report Authenticated By: Waneta Martins, M.D.   Ct Head Wo Contrast  07/13/2011  *RADIOLOGY REPORT*  Clinical Data: Sudden headache, blurred vision  CT HEAD WITHOUT CONTRAST  Technique:  Contiguous axial images were obtained from the base of the skull through the vertex without contrast.  Comparison: 04/07/2006  Findings: No evidence of parenchymal hemorrhage or extra-axial fluid collection. No mass lesion, mass effect, or midline shift.  No CT evidence of acute infarction.  Subcortical white matter and periventricular small vessel  ischemic changes.  Cerebral volume is age appropriate.  No ventriculomegaly.  The visualized paranasal sinuses are essentially clear. The mastoid air cells are unopacified.  No evidence of calvarial fracture.  IMPRESSION: No evidence of acute intracranial abnormality.  Small vessel ischemic changes.  Original Report Authenticated By: Charline Bills, M.D.   Mr [facility] Wo Contrast  07/14/2011  *RADIOLOGY REPORT*  Clinical Data:  Right leg weakness.  Slurred speech.  MRI HEAD WITHOUT CONTRAST MRA HEAD WITHOUT CONTRAST  Technique:  Multiplanar, multiecho pulse sequences of the brain and surrounding structures were obtained without intravenous contrast. Angiographic images of the head were obtained using MRA technique without contrast.  Comparison:  CT 07/13/2011  MRI HEAD  Findings:  Negative for acute infarct.  Scattered hyperintensities in the deep cerebral white matter bilaterally.  This pattern is most compatible with chronic microvascular ischemia of a mild degree.  Brainstem and cerebellum are normal.  Ventricle size is normal.  Negative for hemorrhage or mass lesion. Paranasal sinuses are clear.  IMPRESSION: Mild chronic microvascular ischemia in the white matter.  No acute abnormality.  MRA HEAD  Findings: Small vertebral basilar system due to fetal origin of the posterior cerebral artery bilaterally.  This is a normal variant.  Both vertebral arteries are patent to the basilar.  PICA, AICA, and superior cerebellar arteries are patent bilaterally.  Hypoplastic distal basilar artery.  Left P1 segment is not visualized.  Small right P1 segment is present.  Internal carotid artery is patent bilaterally through the cavernous segment without significant stenosis.  Anterior and middle cerebral arteries are patent bilaterally without stenosis.  Negative for cerebral aneurysm.  IMPRESSION: No significant intracranial abnormality.  Normal variant anatomy in the circle of Willis.  Original Report Authenticated By:  Camelia Phenes, M.D.   Mr Brain Wo Contrast  07/14/2011  *RADIOLOGY REPORT*  Clinical Data:  Right leg weakness.  Slurred speech.  MRI HEAD WITHOUT CONTRAST MRA HEAD WITHOUT CONTRAST  Technique:  Multiplanar, multiecho pulse sequences of the brain and surrounding structures were obtained without intravenous contrast. Angiographic images of the head were obtained using MRA technique without contrast.  Comparison:  CT 07/13/2011  MRI HEAD  Findings:  Negative for acute infarct.  Scattered hyperintensities in the deep cerebral white matter bilaterally.  This pattern is most compatible with chronic microvascular ischemia of a mild degree.  Brainstem and cerebellum are normal.  Ventricle size is normal.  Negative for hemorrhage or mass lesion. Paranasal sinuses are clear.  IMPRESSION: Mild chronic microvascular ischemia in the white matter.  No acute abnormality.  MRA HEAD  Findings: Small vertebral basilar system due to fetal origin of the posterior cerebral artery bilaterally.  This is a normal variant.  Both vertebral arteries are patent to the basilar.  PICA, AICA, and superior cerebellar arteries are patent bilaterally.  Hypoplastic distal basilar artery.  Left P1 segment is not visualized.  Small right P1 segment is present.  Internal carotid artery is patent bilaterally through the cavernous segment without significant stenosis.  Anterior and middle cerebral arteries are patent bilaterally without stenosis.  Negative for cerebral aneurysm.  IMPRESSION: No significant intracranial abnormality.  Normal variant anatomy in the circle of Willis.  Original Report Authenticated By: Camelia Phenes, M.D.   Mr Lumbar Spine W Wo Contrast  07/26/2011  *RADIOLOGY REPORT*  Clinical Data: 71 year old female status post lumbar surgery 2 years ago.  Low back pain radiating down the right side.  MRI LUMBAR SPINE WITHOUT AND WITH CONTRAST  Technique:  Multiplanar and multiecho pulse sequences of the lumbar spine were obtained  without and with intravenous contrast.  Contrast: 18mL MULTIHANCE GADOBENATE DIMEGLUMINE 529 MG/ML IV SOLN  Comparison: CT abdomen pelvis 07/26/2011 and earlier.  Findings: Lumbar segmentation appears to be normal.  Stable visualized abdominal viscera.  There is trace pelvic free fluid on the left re-identified.  No other inflammatory change in the visible pelvis.  Visualized paraspinal soft tissues are within normal limits. Visualized lower thoracic spinal cord is normal with conus medularis at L1.  Chronic degenerative endplate changes at L5-S1.  Normal vertebral height and alignment.  Mild chronic degenerative endplate changes at T11-T12. No marrow edema or evidence of acute osseous abnormality.  No abnormal intradural enhancement.  Cauda equina nerve roots within normal limits.  T11-T12:  Anterior disc bulge which will not cause neural compromise.  T12-L1:  Anterior disc bulge.  L1-L2:  Negative disc.  Mild to moderate facet hypertrophy.  No stenosis.  L2-L3:  Negative disc.  Moderate facet and ligament flavum hypertrophy.  No stenosis.  L3-L4:  Negative disc.  Moderate facet and ligament flavum hypertrophy.  No stenosis.  L4-L5:  Circumferential disc bulge.  Superimposed small right paracentral disc protrusion with caudal migration of disc. Moderate to severe facet and ligament flavum hypertrophy.  Mild spinal stenosis.  Mild right lateral recess stenosis, in close proximity to the disc material.  Mild right L4 foraminal stenosis.  L5-S1:  Postoperative changes to the left lamina.  Moderate facet hypertrophy.  Small posteriorly situated synovial cyst on the left which will not cause neural compromise.  Circumferential disc osteophyte complex.  Postoperative changes to the left lateral recess.  No spinal or lateral recess stenosis.  There is severe left L5 foraminal stenosis.  There is minimal to mild right foraminal stenosis.  IMPRESSION: 1.  Multifactorial L4-L5 mild spinal stenosis, right lateral recess and  right foraminal stenosis.  Small right paracentral disc protrusion contributes to these findings. 2.  Postoperative changes at L5-S1 on the left.  Chronic-appearing severe left L5 foraminal stenosis. 3.  Moderate lumbar facet degeneration elsewhere. 4.  Stable visualized abdominal and pelvic viscera from the CT earlier today, including nonspecific trace pelvic free fluid on the left.  Original Report Authenticated By: Harley Hallmark, M.D.   US Abdomen Complete  07/29/2011  *RADIOLOGY REPORT*  Clinical Data:  Elevated LFTs, abdominal pain  ULTRASOUND ABDOMEN:  Technique:  Sonography of upper abdominal structures was performed. Image quality degraded secondary to body habitus.  Comparison:  None Correlation:  CT abdomen and pelvis 07/26/2011  Gallbladder:  Surgically  absent  Common bile duct:  13 mm diameter, prominent for post cholecystectomy.  Liver:  Coarsened increased echogenicity question fatty infiltration versus cirrhosis.  Suboptimal assessment of intrahepatic detail due to poor sound transmission.  No gross abnormality identified, but intrahepatic pathology is not excluded.  IVC:  Grossly normal  Pancreas:  Appears echogenic question fatty replacement. Suboptimally visualized at head and tail.  Spleen:  Normal appearance, 7.7 cm length.  Right kidney:  9.6 cm length. Normal morphology without mass or hydronephrosis.  Left kidney:  9.3 cm length.  Cortical thinning.  No gross mass or hydronephrosis.  Aorta:  Normal caliber  Other:  No free fluid  IMPRESSION: Question fatty infiltration of liver versus cirrhosis. Biliary dilatation, CBD 13 mm diameter, slightly prominent for post cholecystectomy; if patient's LFTs remain elevated, then consider MR imaging using MRCP technique to further evaluate. No other upper abdominal sonographic abnormalities identified. Incomplete pancreatic and intra-hepatic visualization, better assessed on recent CT.  Original Report Authenticated By: Lollie Marrow, M.D.   Ct  Abdomen Pelvis W Contrast  07/26/2011  *RADIOLOGY REPORT*  Clinical Data: Abdominal pain  CT ABDOMEN AND PELVIS WITH CONTRAST  Technique:  Multidetector CT imaging of the abdomen and pelvis was performed following the standard protocol during bolus administration of intravenous contrast.  Contrast:  100 ml of Omnipaque-300  Comparison: 02/24/2009  Findings: Lung bases are clear.  Small hiatal hernia and distal esophageal wall thickening.  Round cystic density along the inferior margin of the right hepatic lobe is without significant interval change.  Diffuse low attenuation suggests fatty infiltration.  Unremarkable spleen, pancreas, adrenal glands.  Biliary ductal prominence, with the CBD measuring up to 1 cm.  This is without significant interval change. Tapers to the level of the ampulla.  Lobular renal contours without focal abnormality.  No hydronephrosis or hydroureter.  Colonic diverticulosis.  No CT evidence for diverticulitis.  Normal appendix.  No bowel obstruction.  No free intraperitoneal air or fluid.  No lymphadenopathy.  Tiny fat containing umbilical hernia.  Thin-walled bladder.  Absent uterus.  No adnexal mass.  Trace free fluid within the pelvis.  There is scattered atherosclerotic calcification of the aorta and its branches. No aneurysmal dilatation.  Multilevel degenerative changes of the imaged spine. No acute or aggressive appearing osseous lesion.  IMPRESSION: No acute CT abnormality identified.  Low attenuation of the liver suggests fatty infiltration.  CBD prominence up to 1 cm.  Correlate with LFTs and ERCP or MRCP if warranted.  Colonic diverticulosis without CT evidence for diverticulitis.  Status post hysterectomy.  Trace free fluid within the pelvis is nonspecific.  Original Report Authenticated By: Waneta Martins, M.D.     Admission HPI This is a 71 year old female, with known history of obesity, HTN, non-obstructive CAD, per cardiac catheterization of 03/2006, diastolic  dysfunction, EF 60%-65%, per 2D Echo 03/2006, anxiety,, fibromyalgia, dyslipidemia, recurrent syncope 2008-2009, of unclear etiology, s/p cholecystectomy, hysterectomy, tonsillectomy/adenoidectomy, and back surgery, who presents with above symptoms. According to patient, who is quite a good historian, she has just been discharged, after hospitalization at [facility], from 07/13/11-07/14/11, for TIA, and has been quite well, since then, till 07/25/11, at about 9:00 PM, when she experienced a pain in her right lower abdomen, while sitting in a chair, watching TV. The pain has remained constant, and on getting up, she experienced right leg pain, radiating down the back of her leg, which has been progressive, making it difficult for her to weight-bear. By 07/23/11-07/24/11,  she was no longer able to ambulate, and has been chair-bound/bed-bound, since. She went to her primary MD's office on 07/25/11, was seen by the PA, Tammy, an abdominal CT scan was arranged, and she was sent home, with instructions to go to the ED, should symptoms worsen. At about midnight, she was unable to get up after using the bathroom, so her family brought her to the ED. She has no history of antecedent trauma or fall.  Physical exam General Appearance:  Alert, cooperative, no distress, obese   Lungs:  Clear to auscultation bilaterally, respirations unlabored   Heart:  Regular rate and rhythm, S1 and S2 normal, no murmur, rub  or gallop   Abdomen:  Soft, mild right lower quadrant tenderness- closer to grooin area, no rebound, bowel sounds active all four quadrants,  no masses, no organomegaly   Extremities:  no edema   Neurologic:  CNII-XII intact, states sensory grossly intact      Hospital Course by problem list: *L4-5 right paracentral disc protrusion (small) with pain radiating to right leg Active Problems:  TIA on medication  Fibromyalgia  HTN (hypertension)  Dyslipidemia  CAD (coronary artery disease)  Patient Active Hospital Problem  List: pain radiating to right leg (07/26/2011)-multifactorial L4-L5 stenosis with small right paracentral disc protrusion per MRI  Upon admission the patient had a CT scan of her abdomen and pelvis and she was complaining of  right lower quadrant pain was in addition to pain that was radiating down her leg her. The CT scan of her abdomen and pelvis results as above with no acute findings noted. An MRI was done to further evaluate that the pain that was radiating down her leg, patient insisted that she was not having any back pain. The MRI did show multifactorial L4-L5 stenosis with small right paracentral disc protrusion that could  cause the radiculopathy  that she was having. Following this the patient was started on steroids and her symptoms quickly improved. PT OT was consulted as well and he saw the patient and recommended home health PT OT. Case manager as outpatient to set up home health PT but she declined this services. She is clinically improved at this time and we discharged on a prednisone taper and is to followup with her primary care physician. The patient as mentioned are reported that she was having right lower quadrant pain which on exam was more so in her right groin area, and this pain was persistent. An abdominal ultrasound was obtained to further evaluate and it came back of with him fatty liver infiltration which is the patient indicated she was already aware of as this had been diagnosed in the past, but no other acute findings. Pain control with a narcotics was offered the patient reported that she was intolerant of of the psychotic arm and reported itching with Dilaudid and Ultram that was offered in the hospital. Following the abdominal ultrasound GI was consulted-she is followed by Dr. Juanda Chance. Dr. Rhea Belton with [facility] saw the patient and his impression was that this pain of musculoskeletal as well. On followup this morning and the patient states she feels much better and a wants to go  home. She'll be discharged at this time and is to followup with Dr. are Juanda Chance outpatient arm as directed per GI. -abd [place] -neg, I have consulted GI for further recs and awaiting GI attending final recs. TIA on medication (07/26/2011)  She was maintained on Plavix and is to continue upon discharge.  Fibromyalgia (07/26/2011)  HTN (hypertension) (07/26/2011)  She was maintained on her outpatient medications during this hospital stay.  Dyslipidemia (07/26/2011)  She is to Continue Lipitor upon discharge. CAD (coronary artery disease) (07/26/2011)  She was chest pain-free, and was maintained on her outpatient medications      Discharge Vitals:  BP 128/68  Pulse 58  Temp(Src) 97.8 F (36.6 C) (Oral)  Resp 16  Ht 5\' 3"  (1.6 m)  Wt 89.812 kg (198 lb)  BMI 35.07 kg/m2  SpO2 97%  Discharge Labs: No results found for this or any previous visit (from the past 24 hour(s)).  SignedKela Millin 07/30/2011, 11:28 AM

## 2011-08-01 ENCOUNTER — Encounter: Payer: Self-pay | Admitting: *Deleted

## 2011-08-08 ENCOUNTER — Encounter: Payer: Self-pay | Admitting: *Deleted

## 2011-08-16 ENCOUNTER — Ambulatory Visit: Payer: Medicare Other | Admitting: Internal Medicine

## 2011-08-16 ENCOUNTER — Telehealth: Payer: Self-pay | Admitting: Internal Medicine

## 2011-08-16 NOTE — Telephone Encounter (Signed)
Dr Juanda Chance- Would you like to charge late cancellation fee?

## 2011-08-16 NOTE — Telephone Encounter (Signed)
Please charge late  Cancellation fee.

## 2011-08-19 ENCOUNTER — Other Ambulatory Visit: Payer: Self-pay | Admitting: Pulmonary Disease

## 2011-08-31 ENCOUNTER — Other Ambulatory Visit: Payer: Self-pay | Admitting: Pulmonary Disease

## 2011-08-31 NOTE — Telephone Encounter (Signed)
Electronic refill request for xanax 0.5mg  - 1/2 to 1 tab by mouth tid prn.  Dr Kriste Basque please advise if okay to refill, thanks!  Saw SN 1.28.13, TP 2.4.13 and has upcoming ov with SN 4.29.13.

## 2011-08-31 NOTE — Telephone Encounter (Signed)
Called rx into the pharmacy for refill.

## 2011-09-01 ENCOUNTER — Other Ambulatory Visit: Payer: Self-pay | Admitting: *Deleted

## 2011-09-01 MED ORDER — POTASSIUM CHLORIDE CRYS ER 20 MEQ PO TBCR: 20.0000 meq | EXTENDED_RELEASE_TABLET | Freq: Two times a day (BID) | ORAL | Status: DC

## 2011-09-01 MED ORDER — FUROSEMIDE 20 MG PO TABS: 20.0000 mg | ORAL_TABLET | Freq: Every day | ORAL | Status: DC

## 2011-09-08 ENCOUNTER — Telehealth: Payer: Self-pay | Admitting: Pulmonary Disease

## 2011-09-08 NOTE — Telephone Encounter (Signed)
Pt states she has a new grand baby and the pediatrician has recommended that the whole family get the whooping cough vaccine. SN, pls advise.

## 2011-09-08 NOTE — Telephone Encounter (Signed)
She can have  A TDAP if her last tetanus shot is >  5 years.  I could not find her previous one on file.  She has an ov with Dr. Kriste Basque  Next month can get then or come in for the vaccine -vaccine only visit.

## 2011-09-08 NOTE — Telephone Encounter (Signed)
LMOM for pt TCB 

## 2011-09-09 ENCOUNTER — Other Ambulatory Visit: Payer: Self-pay | Admitting: Pulmonary Disease

## 2011-09-09 MED ORDER — POTASSIUM CHLORIDE CRYS ER 20 MEQ PO TBCR: 20.0000 meq | EXTENDED_RELEASE_TABLET | Freq: Two times a day (BID) | ORAL | Status: DC

## 2011-09-09 NOTE — Telephone Encounter (Signed)
Pt states she will discuss with Dr. Kriste Basque at upcoming appt. Carron Curie, CMA

## 2011-10-17 ENCOUNTER — Ambulatory Visit (INDEPENDENT_AMBULATORY_CARE_PROVIDER_SITE_OTHER): Payer: Medicare Other | Admitting: Pulmonary Disease

## 2011-10-17 ENCOUNTER — Other Ambulatory Visit: Payer: Self-pay | Admitting: Pulmonary Disease

## 2011-10-17 ENCOUNTER — Encounter: Payer: Self-pay | Admitting: Pulmonary Disease

## 2011-10-17 VITALS — BP 122/80 | HR 69 | Temp 97.0°F | Ht 63.0 in | Wt 202.2 lb

## 2011-10-17 DIAGNOSIS — E785 Hyperlipidemia, unspecified: Secondary | ICD-10-CM

## 2011-10-17 DIAGNOSIS — IMO0001 Reserved for inherently not codable concepts without codable children: Secondary | ICD-10-CM

## 2011-10-17 DIAGNOSIS — I10 Essential (primary) hypertension: Secondary | ICD-10-CM

## 2011-10-17 DIAGNOSIS — E669 Obesity, unspecified: Secondary | ICD-10-CM

## 2011-10-17 DIAGNOSIS — Z23 Encounter for immunization: Secondary | ICD-10-CM

## 2011-10-17 DIAGNOSIS — K5730 Diverticulosis of large intestine without perforation or abscess without bleeding: Secondary | ICD-10-CM

## 2011-10-17 DIAGNOSIS — T148XXA Other injury of unspecified body region, initial encounter: Secondary | ICD-10-CM

## 2011-10-17 DIAGNOSIS — K7689 Other specified diseases of liver: Secondary | ICD-10-CM

## 2011-10-17 DIAGNOSIS — I2510 Atherosclerotic heart disease of native coronary artery without angina pectoris: Secondary | ICD-10-CM

## 2011-10-17 DIAGNOSIS — G459 Transient cerebral ischemic attack, unspecified: Secondary | ICD-10-CM

## 2011-10-17 DIAGNOSIS — F411 Generalized anxiety disorder: Secondary | ICD-10-CM

## 2011-10-17 MED ORDER — POTASSIUM CHLORIDE CRYS ER 20 MEQ PO TBCR: 20.0000 meq | EXTENDED_RELEASE_TABLET | Freq: Two times a day (BID) | ORAL | Status: DC

## 2011-10-17 MED ORDER — ALPRAZOLAM 0.5 MG PO TABS: ORAL_TABLET | ORAL | Status: DC

## 2011-10-17 MED ORDER — FUROSEMIDE 20 MG PO TABS: 20.0000 mg | ORAL_TABLET | Freq: Every day | ORAL | Status: DC

## 2011-10-17 NOTE — Progress Notes (Signed)
Subjective:    Patient ID: [name], female    DOB: 1940/12/25, 71 y.o.   MRN: [id number]  HPI 71 y/o WF here for a follow up visit... she has mult med problems as noted below... Followed for general medical purposes w/ hx HBP, CAD & hx Syncope followed by DrTaylor, Hyperlipidemia, Obesity, Fatty Liver disease, LBP/ FM/ Osteopenia, & Anxiety...  ~  December 14, 2009:  she reports a good 6 months- no new complaints or concerns... BP controlled on Lasix & she denies CP, palpit, SOB, edema, etc... she notes dizziness if she tilts her head back "it's cardio-depressor dizziness" she says- no syncope... she decided to re-start her Simva80 & never went to the Lipid Clinic> FLP improved on this but LFT's elevated (FLD) & she will need assessment in the clinic + f/u w/ GI.Marland Kitchen.  ~  June 15, 2010:  583mo ROV- she seems conflicted> told the nurse she has no energy & tired all the time; tells me she feels good "best in 37yrs"... she stopped her Simva80 on her own & went to [facility] 7/11 but didn't return 8/11 as requested> FLP today shows LDL=212 & she is encouraged to ret to the clinic for help... BP stable & she denies CP, palpit, edema, etc;  requesting diff nasal spray & we will try Omnaris.  ~  February 02, 2011:  32mo ROV & she continues to do well, feeling good she says w/o new complaints or concerns x heel pain (we discussed warm soaks & stretching exercises) & insomnia (taking 2 Xanax Qhs w/o benefit)- we discussed trial RESTORIL 30mg  Qhs...     She notes BP well controlled; denies CP, palpit, SOB, edema, or cerebral ischemic symptoms; and no further dizziness from her "carotid sinus sensitivity" per DrTaylor;    She did the the Pharmacists in the Lipid Clinic & was tried on Crestor 5mg  but INTOL she says & "they released me back to your care"> we discussed trial of non-statin rx ZETIA 10mg /d along w/ better diet, exercise & wt reduction...  ~  July 05, 2011:  83mo ROV & she is c/o dry cough, & a sl sore  throat x1d for which she would like a prescription for MMW; also notes several skin cancers removed by Derm & she has f/u appt soon...    >HBP controlled w/ Lasix20 + K20Bid; BP= 146/92 here now but better at home she says& she is reminded to elim sodium, get on diet & get wt down...     >Cards followed by DrTaylor for nonobstructive CAD, DD, Hx syncope & carotid sinus hypersensitivity per DrTaylor; on ASA325mg /d...    >She has severe hyperlipidemia & has been INTOL to all meds; last tried Zetia but she stopped this too; she notes that the Lipid clinic gave up on her and "they released me" so now she is on diet alone + FishOil "I'll do the best I can", and she notes "It's hereditary per DrTaylor"; see FLP results below>>    >Obesity is a constant battle; weight= 210#, 63" tall, BMI=37-38; we reviewed low carb low fat weight reducing diet & exercise program...    >Ortho> FM, LBP, Osteopenia, etc...    >Anxiety on Xanax prn...  ~  July 18, 2011:  2wk ROv & post hospital visit> Extensive hosp records reviewed> she had a TIA 07/13/11 characterized by sudden left sided HA, blurry vision, right leg weakness, slurring of speech & inability to find words> all signs/ symptoms cleared over a 2-3H  period; eval in ER & short admission by [facility] w/ Neuro/ Stroke Team consult revealed the following>>  CXR (clear, NAD) & LABS (all essent wnl x FLP as below)...  CT Brain w/ sm vessel dis, NAD...  MRI/ MRA Brain: neg for acute infarct, mild chr microvasc ischemia, normal variant anatomy of the circle of willis, no signif intracranial abn...  CDopplers were neg- no signif ICA stenoses...  2DEcho revealed mild LVH, norm wall motion w/o regional wall motion abn, EF=60%, no cardiac source of emboli apparent... DrSethi changed her ASA325/d to PLAVIX 75mg /d & wanted her to have an outpt Holter Monitor to r/o PAF (recall Hx below of syncope in 2007 w/ neg 40mo implanted loop recorder); they also started pt on Lipitor20mg   even though she was supposedly INTOL to all statins+Zetia as noted below;  We will call in 90d supplies of her Plavix & Lipitor, she will continue on the CoQ10 OTC... She has not had any additional or recurrent problems since disch...  ~  October 17, 2011:  19mo ROV & post hospital check> she was re-admitted 2/5 - 07/30/11 by [facility] w/ pain in right groin/ upper leg/ inguinal area that was very confusing & of uncertain etiology but she had difficulty bearing wt/ walking;  Abd Sonar showed s/p GB, prominent CBD, increased liver echo texture, NAD;  CT Abd & Pelvis was neg x FLD, Divertics, absent uterus, scat calcif in Ao/ vessels;  XRay of right hip was neg;  MRI Lumbar Spine showed numerous abnormalities including L4-5sp stenosis, right lat recess & foraminal stenosis, sm right paracentral disc protrusion; post op changes on left L5-S1, L5 foraminal stenosis, mod lumbar facet degen;  She was given PREDNISONE w/ improvement according to the DC Summary...    Discharge note & Med Reconciliation carefully reviewed & she was disch on the same meds BUT SHE IS ADAMANT THAT THEY STOPPED HER Atorvastatin, Calcium, Plavix, & Pantoprazole> she has not been taking any of these meds since discharge!  She was supposed to f/u w/ DrDBrodie but she says DrPyrtle said it was not necessary;  I reviewed the Scan & XRay results w/ Lumbar Sp findings & I rec f/u by Ortho to address these issues & the upper leg/ inguinal pain...    She requests TDAP today- OK...  We have requested her to re-start the Plavix 75mg /d, & Atorvastatin 20mg /d that were started by DrSethi during the prev hosp for TIA...  We have further requested that she see her Orthopedist regarding the findings on her MRI Lumbar spine & the pain in her right upper leg/ inguinal area...  Finally she is asked to follow up w/ DrDBrodie as planned for her ?fatty liver dis etc...   Problem List:    << PROBLEM LIST UPDATED 10/17/11 >>   ALLERGIC RHINITIS - she states that saline  nasal spray dries her eyes out & wants diff inhaler> rec OMNARIS 2sp Qhs...  HYPERTENSION (ICD-401.9) - off Norvasc & Diltiazem, on LASIX 20mg /d, K33mEqBid... ~  8/12: BP=136/92 =>130/80 after rest today and she denies HA, fatigue, visual changes, CP, dyspnea, edema, etc; she notes occas palpitations...  ~  1/13: BP= 146/92 but she notes better at home & she is reminded to elim sodium, get on diet & get weight down... ~  1/13 post-hosp: BP= 132/84 & she feels back to baseline... ~  4/13: BP= 122/80 & she is feeling better she says...  CORONARY ARTERY DISEASE (ICD-414.00) - hx non-obstructive CAD followed by DrTaylor Antony Haste... she  denies CP, palpit, ch in SOB, edema, etc... ~  cath 1996 showed 20-30% lesions in the Circ & RCA, EF=60%... ~  NuclearStressTest 10/07 showed no scar or ischemia, ?EF=37%?... ~  2DEcho 10/07 showed no regional wall motion changes, EF=60-65%... valves OK, some DD noted. ~  repeat cath 12/09 showed similar findings w/ nonobstructive plaque, EF= 65%... ~  1/13:  2DEcho showed mild LVH, norm wall motion w/o regional wall motion abn, EF=60%, no cardiac source of emboli apparent...  Hx of SYNCOPE (ICD-780.2) - hosp in 2007 for syncope and no etiology discovered... implanted loop recorder placed for 18 months w/o events, then 2 spells after the recorder was removed!!!... rehosp 4/08 w/ neg studies at that time too... repeat eval 11/09 & nothing found... Dx w/ Carotid Sinus Hypersensitivity by DrTaylor... ~  CDopplers 10/07 showed mild plaque, 0-39% bilat ICA... ~  neg TiltTable study 4/08... ~  6/10: notes occas palpit, dizzy- uses Meclizine Prn... saw DrTaylor 7/10- rec diet + exerc. ~  No further syncope or near-syncope by her report... ~  CDopplers 1/13 showed neg- no signif ICA stenoses...  HYPERLIPIDEMIA (ICD-272.4) - she has proven INTOL to all meds & was "released" from the [facility]id[facility]inic> we discussed diet + exercise again... ~  FLP 3/09 showed TChol 139, TG 74, HDL 41,  LDL 83... continue med + diet therapy...  ~  FLP 11/09 showed TChol 99, TG 74, HDL 30, LDL 54 ~  FLP 6/10 on Simva80 showed TChol 141, TG 90, HDL 34, LDL 89... note: LFT's sl elevated... ~  FLP 12/10 off Simva showed TChol 279, TG 178, HDL 33, LDL 208... rec> refer to Lipid Clinic (she never went). ~  FLP 6/11 back on Simva80 showed TChol 154, TG 107, HDL 37, LDL 96... needs LC eval to ch med. ~  FLP 12/11 off meds showed TChol 270, TG 145, HDL 39, LDL 213... rec to return to [facility] for on-going care. ~  Lipid Clinic tried Crestor 5mg  but she states INTOL & "they released me" according to the pt... ~  8/12:  Try Zetia 10mg /d along w/ better diet, exercise, wt reduction => she states INTOL to Zetia as well... ~  1/13: FLP on diet alone showed TChol 234, TG 147, HDL 37, LDL 163... She will continue to work on diet & exercise... ~  1/13 post hosp:  DrSethi started pt on LIPITOR 20mg /d + CoQ10 100mg /d after her TIA "so far so good" ~  4/13:  Pt stopped the Atorva20 after her 2/13 hosp for right leg/inguinal area pain; she swears she was told to stop it by the doctors but all notes indicate she was getting it in the hosp & discharged on it as well;  She is asked to restart it now...  OBESITY (ICD-278.00) - We discussed diet + exercise program... she states that DrOz says you can gain weight while hardly eating any food & she thinks she isn't eating enough food...  ~  weight 12/09 = 212# ~  weight 6/10 = 219# ~  weight 12/10 = 217# ~  weight 6/11 = 215# ~  weight 12/11 = 216#.Marland Kitchen we discussed diet + exercise. ~  Weight 8/12 = 213# ~  Weight 1/13 = 210# ~  Weight 4/13 = 202#  DIVERTICULOSIS OF COLON (ICD-562.10) - she needs f/u eval DrDBrodie- ?when was last colonoscopy? ~  CT Abd 9/10  done for Abd pain- showed divertics & hep steatosis, retained stool & DJD sp... ~  CT Abd&Pelvis 2/13  showed smHH, diffuse low attenuation of liver suggests fatty infiltration, diverticulosis, scattered atherosclerotic  calcif in Ao & branches, multilevel degen changes in the spine...  FATTY LIVER DISEASE (ICD-571.8) - hx elevated LFT's x yrs and eval DrDBrodie in past w/ rec to diet/ exerc/ weight reduction... ~  labs 6/10 showed SGOT= 89, SGPT= 65 ~  labs 12/10 showed SGOT= 65, SGPT= 54 ~  labs 6/11 showed SGOT= 119, SGPT= 76... needs re-eval by GI ~  labs 12/11 showed SGOT= 53, SGPT= 44 ~  Labs 1/13 showed SGOT= 53, SGPT= 41 ~  Labs 2/13 showed SGOT= 39, SGPT= 25  LOW BACK PAIN, CHRONIC (ICD-724.2) - s/p lumbar surg in 1989 by DrBotero... ~  4/13:  She denies back pain, but has right leg pain in the upper leg/ inguinal area & had difficulty walking & wt bearing... MRI Lumbar Spine 2/13 revealed   FIBROMYALGIA (ICD-729.1)  OSTEOPENIA (ICD-733.90) - ?prev BMD from GYN... on Calcium, MVI, VitD... ~  BMD here 3/09 showed TScores -0.7 in Spine, and -1.6 in left FemNeck ~  6/11:  f/u BMD showed TScores -0.7 in Spine, and -1.1 in right FemNeck  DIZZINESS (ICD-780.4) - she takes MECLIZINE 25mg  Bid... ~  s/p ENT eval by Gayla Medicus 2/08 felt to be benign positional vertigo, Rx w/ meclizine...  ~  she cancelled a planned neuro evaluation 4/08...  TIA >> SEE ABOVE in-patient eval 1/13 by [facility] & Stroke team... ASA stopped & pt placed on PLAVIX 75mg /d... ~  4/13:  Pt reports that she was told during her 2/13 Hosp to stop the Plavix, but she was given this med daily while in the [facility] & it is on the disch med list;  She stopped it at discharge & was off it for 6=weeks w/o ill effects;  She is rec to re-start the PLAVIX 75mg /d for stroke prevention...  ANXIETY (ICD-300.00) - on XANAX 0.5mg Tid prn... ~  8/12:  She is c/o insomnia & the Xanax doesn't help she said; rec trial Restoril 30mg Qhs prn...  SKIN CANCER - s/o Moh's surg for BCCa left cheek...  HEALTH MAINT--- GYN= prev DrFore and needs new Gyn now... [facility]... had neg FlexSig 1991 DrBrodie and needs routine colonoscopy scheduled... states she  uses olive oil on her dry skin per DrOz... had Pneumovax 2008, & 2010 Flu shot in Oct.   Current Medications, Allergies, Past Medical History, Past Surgical History, Family History, and Social History were reviewed in [employer] record.   Past Surgical History  Procedure Date  . Tonsillectomy and adenoidectomy   . Total abdominal hysterectomy w/ bilateral salpingoophorectomy   . Cholecystectomy   . Back surgery   . Mohs surgery     Outpatient Encounter Prescriptions as of 10/17/2011  Medication Sig Dispense Refill  . ALPRAZolam (XANAX) 0.5 MG tablet TAKE ONE-HALF TO ONE TABLET BY MOUTH THREE TIMES DAILY AS NEEDED FOR  NERVES.(MAX OF 3 TABLETS PER DAY)  90 tablet  1  . famotidine (PEPCID) 20 MG tablet Take 1 tablet (20 mg total) by mouth at bedtime.  30 tablet  11  . fluticasone (CUTIVATE) 0.05 % cream Apply 1 application topically as needed. Apply to toe      . furosemide (LASIX) 20 MG tablet Take 1 tablet (20 mg total) by mouth daily.  30 tablet  2  . potassium chloride SA (K-DUR,KLOR-CON) 20 MEQ tablet Take 1 tablet (20 mEq total) by mouth 2 (two) times daily.  180 tablet  1  .  Calcium Carbonate-Vitamin D (CALTRATE 600+D) 600-400 MG-UNIT per tablet Take 1 tablet by mouth daily.         NOTE: Pt erroneously stopped her ATORVASTATIN, CoQ10, & PLAVIX after her last [facility] 2/13... ==> she is asked to restart these meds now...      Allergies  Allergen Reactions  . Codeine     REACTION: nausea  . Diltiazem Hcl     REACTION: pt states it made her dizzy...  . Sulfonamide Derivatives     REACTION: red/splotchy hives  . Tramadol Itching    Current Medications, Allergies, Past Medical History, Past Surgical History, Family History, and Social History were reviewed in [employer] record.    Review of Systems       See HPI - all other systems neg except as noted...       The patient complains of dyspnea on exertion.  The patient denies  anorexia, fever, weight loss, weight gain, vision loss, decreased hearing, hoarseness, chest pain, syncope, peripheral edema, prolonged cough, headaches, hemoptysis, abdominal pain, melena, hematochezia, severe indigestion/heartburn, hematuria, incontinence, muscle weakness, suspicious skin lesions, transient blindness, difficulty walking, depression, unusual weight change, abnormal bleeding, enlarged lymph nodes, and angioedema.     Objective:   Physical Exam     WD, Obese, 71 y/o WF in NAD... GENERAL:  Alert & oriented; pleasant & cooperative... HEENT:  [place]/AT, EOM-wnl,  PERRLA, EACs- wax, TMs-wnl, NOSE-pale, THROAT-clear & wnl. NECK:  Supple w/ fairROM; no JVD; normal carotid impulses w/o bruits; no thyromegaly or nodules palpated; no lymphadenopathy. CHEST:  Clear BS bilaterally without wheezing, rales, or signs of consolidation... HEART:  Regular Rhythm; without murmurs/ rubs/ or gallops heard... ABDOMEN:  Soft & nontender; normal bowel sounds; no organomegaly or masses detected. EXT: without deformities, mild arthritic changes; no varicose veins/ +venous insuffic/ tr edema... NEURO:  CN's intact, no focal neuro deficits... DERM:  scar on left cheek, no lesions noted...  RADIOLOGY DATA:  Reviewed in the EPIC EMR & discussed w/ the patient...  LABORATORY DATA:  Reviewed in the EPIC EMR & discussed w/ the patient...   Assessment & Plan:   TIA>>  See above work up by [facility] & Stroke Team 1/13 Hosp; no source of the TIA found & pt was switched from ASA 325mg /d to PLAVIX 75mg /d...  HBP>  Control is fair on LASIX;  diet w/ weight reduction stressed to pt or we may need to add meds.  CAD>  Followed by DrTaylor for Cards now on PLAVIX; denies angina etc but she is too sedentary & needs incr exercise etc...  Hx Syncope>  Extensive eval by Cards, DrTaylor- dx w/ carotid sinus hypersensitivity; no recurrent episodes she says; she uses Meclizine prn dizziness...  HYPERLIPID>  A signif problem for  her but INTOL to all statins and the Lipid Clinic has "released her" she says; she was also intol to Zetia & she has decided to forgo med rx & concentrate on diet alone>> then she had the TIA & DrSethi insisted on ATORVASTATIN20mg  which she is currently on & tolerating ok...  OBESITY>  We reviewed the needed diet, exercise & wt reduction programs...  GI> Divertics>  She needs f/u DrDBrodie & consideration of f/u colonoscopy...  Fatty Liver Disease>  Hx elev LFTs and CT Abd 9/10 & 2/13 w/ hepatic steatosis; we discussed the need for weight reduction & she will f/u w/ GI...  ORTHO> LBP, FM, Osteopenia>  Prev lumbar surg from DrBotero, she uses OTC analgesics, calcium, MVI.Marland KitchenMarland Kitchen  Due to right leg pain she will need Ortho eval & she will call DrGraves for this purpose...  Anxiety>  On Xanax as needed & we will give her a trial of RESTORIL 30mg  Qhs for insomnia...   Patient's Medications  New Prescriptions   No medications on file ==> SHE WAS ASKED TO RESTART PLAVIX & ATORVASTATIN...  Previous Medications   ATORVASTATIN (LIPITOR) 20 MG TABLET    Take 1 tablet (20 mg total) by mouth daily.   CLOPIDOGREL (PLAVIX) 75 MG TABLET    Take 1 tablet (75 mg total) by mouth daily with breakfast.   FAMOTIDINE (PEPCID) 20 MG TABLET    Take 1 tablet (20 mg total) by mouth at bedtime.   FLUTICASONE (CUTIVATE) 0.05 % CREAM    Apply 1 application topically as needed. Apply to toe   PANTOPRAZOLE (PROTONIX) 40 MG TABLET    Take 1 tablet (40 mg total) by mouth daily. 30 minutes prior to a meal  Modified Medications   Modified Medication Previous Medication   ALPRAZOLAM (XANAX) 0.5 MG TABLET ALPRAZolam (XANAX) 0.5 MG tablet      Take 1/2 to 1 tablet by mouth three times daily as needed for nerves  (MAX OF 3 TABLETS PER DAY)    TAKE ONE-HALF TO ONE TABLET BY MOUTH THREE TIMES DAILY AS NEEDED FOR  NERVES.(MAX OF 3 TABLETS PER DAY)   FUROSEMIDE (LASIX) 20 MG TABLET furosemide (LASIX) 20 MG tablet      Take 1 tablet (20 mg  total) by mouth daily.    Take 1 tablet (20 mg total) by mouth daily.   POTASSIUM CHLORIDE SA (K-DUR,KLOR-CON) 20 MEQ TABLET potassium chloride SA (K-DUR,KLOR-CON) 20 MEQ tablet      Take 1 tablet (20 mEq total) by mouth 2 (two) times daily.    Take 1 tablet (20 mEq total) by mouth 2 (two) times daily.  Discontinued Medications   CALCIUM CARBONATE-VITAMIN D (CALTRATE 600+D) 600-400 MG-UNIT PER TABLET    Take 1 tablet by mouth daily.     COENZYME Q10 (CO Q 10) 100 MG CAPS    Take 1 capsule by mouth daily.

## 2011-10-17 NOTE — Patient Instructions (Signed)
Today we updated your med list in our EPIC system...    Continue your current medications the same for now...  We will arrange for an Orthopedic consultation w/ DrGraves to evaluate the intermittent pain you are getting in the upper part of your right leg near the groin area...  In the meanwhile, I would recommend some warm compresses to the area when it is hurting...    Use the Tylenol/ Aleve, etc as needed as well...  Let's plan another follow up visit in 3 months to monitor your progress.Marland KitchenMarland Kitchen

## 2011-10-19 ENCOUNTER — Other Ambulatory Visit: Payer: Self-pay | Admitting: *Deleted

## 2011-10-19 MED ORDER — ATORVASTATIN CALCIUM 20 MG PO TABS: 20.0000 mg | ORAL_TABLET | Freq: Every day | ORAL | Status: DC

## 2012-01-04 ENCOUNTER — Ambulatory Visit (INDEPENDENT_AMBULATORY_CARE_PROVIDER_SITE_OTHER): Payer: Medicare Other | Admitting: Pulmonary Disease

## 2012-01-04 ENCOUNTER — Encounter: Payer: Self-pay | Admitting: Pulmonary Disease

## 2012-01-04 VITALS — BP 124/82 | HR 74 | Temp 96.9°F | Ht 63.0 in | Wt 202.0 lb

## 2012-01-04 DIAGNOSIS — R55 Syncope and collapse: Secondary | ICD-10-CM

## 2012-01-04 DIAGNOSIS — M899 Disorder of bone, unspecified: Secondary | ICD-10-CM

## 2012-01-04 DIAGNOSIS — K5730 Diverticulosis of large intestine without perforation or abscess without bleeding: Secondary | ICD-10-CM

## 2012-01-04 DIAGNOSIS — I2510 Atherosclerotic heart disease of native coronary artery without angina pectoris: Secondary | ICD-10-CM

## 2012-01-04 DIAGNOSIS — M545 Low back pain, unspecified: Secondary | ICD-10-CM

## 2012-01-04 DIAGNOSIS — E669 Obesity, unspecified: Secondary | ICD-10-CM

## 2012-01-04 DIAGNOSIS — I10 Essential (primary) hypertension: Secondary | ICD-10-CM

## 2012-01-04 DIAGNOSIS — E785 Hyperlipidemia, unspecified: Secondary | ICD-10-CM

## 2012-01-04 DIAGNOSIS — IMO0001 Reserved for inherently not codable concepts without codable children: Secondary | ICD-10-CM

## 2012-01-04 DIAGNOSIS — K7689 Other specified diseases of liver: Secondary | ICD-10-CM

## 2012-01-04 DIAGNOSIS — M949 Disorder of cartilage, unspecified: Secondary | ICD-10-CM

## 2012-01-04 MED ORDER — PANTOPRAZOLE SODIUM 40 MG PO TBEC: 40.0000 mg | DELAYED_RELEASE_TABLET | Freq: Every day | ORAL | Status: DC

## 2012-01-04 MED ORDER — ALPRAZOLAM 0.5 MG PO TABS: ORAL_TABLET | ORAL | Status: DC

## 2012-01-04 NOTE — Progress Notes (Signed)
Subjective:    Patient ID: [name], female    DOB: [birth year], 71 y.o.   MRN: [id number]  HPI 71 y/o WF here for a follow up visit... she has mult med problems as noted below... Followed for general medical purposes w/ hx HBP, CAD & hx Syncope followed by DrTaylor, Hyperlipidemia, Obesity, Fatty Liver disease, LBP/ FM/ Osteopenia, & Anxiety...  ~  December 14, 2009:  she reports a good 6 months- no new complaints or concerns... BP controlled on Lasix & she denies CP, palpit, SOB, edema, etc... she notes dizziness if she tilts her head back "it's cardio-depressor dizziness" she says- no syncope... she decided to re-start her Simva80 & never went to the Lipid Clinic> FLP improved on this but LFT's elevated (FLD) & she will need assessment in the clinic + f/u w/ GI.Marland Kitchen.  ~  June 15, 2010:  10mo ROV- she seems conflicted> told the nurse she has no energy & tired all the time; tells me she feels good "best in 73yrs"... she stopped her Simva80 on her own & went to [facility] 7/11 but didn't return 8/11 as requested> FLP today shows LDL=212 & she is encouraged to ret to the clinic for help... BP stable & she denies CP, palpit, edema, etc;  requesting diff nasal spray & we will try Omnaris.  ~  February 02, 2011:  53mo ROV & she continues to do well, feeling good she says w/o new complaints or concerns x heel pain (we discussed warm soaks & stretching exercises) & insomnia (taking 2 Xanax Qhs w/o benefit)- we discussed trial RESTORIL 30mg  Qhs...     She notes BP well controlled; denies CP, palpit, SOB, edema, or cerebral ischemic symptoms; and no further dizziness from her "carotid sinus sensitivity" per DrTaylor;    She did the the Pharmacists in the Lipid Clinic & was tried on Crestor 5mg  but INTOL she says & "they released me back to your care"> we discussed trial of non-statin rx ZETIA 10mg /d along w/ better diet, exercise & wt reduction...  ~  July 05, 2011:  64mo ROV & she is c/o dry cough, & a sl sore  throat x1d for which she would like a prescription for MMW; also notes several skin cancers removed by Derm & she has f/u appt soon...    >HBP controlled w/ Lasix20 + K20Bid; BP= 146/92 here now but better at home she says& she is reminded to elim sodium, get on diet & get wt down...     >Cards followed by DrTaylor for nonobstructive CAD, DD, Hx syncope & carotid sinus hypersensitivity per DrTaylor; on ASA325mg /d...    >She has severe hyperlipidemia & has been INTOL to all meds; last tried Zetia but she stopped this too; she notes that the Lipid clinic gave up on her and "they released me" so now she is on diet alone + FishOil "I'll do the best I can", and she notes "It's hereditary per DrTaylor"; see FLP results below>>    >Obesity is a constant battle; weight= 210#, 63" tall, BMI=37-38; we reviewed low carb low fat weight reducing diet & exercise program...    >Ortho> FM, LBP, Osteopenia, etc...    >Anxiety on Xanax prn...  ~  July 18, 2011:  2wk ROV & post hospital visit> Extensive hosp records reviewed> she had a TIA 07/13/11 characterized by sudden left sided HA, blurry vision, right leg weakness, slurring of speech & inability to find words> all signs/ symptoms cleared over a 2-3H  period; eval in ER & short admission by [facility] w/ Neuro/ Stroke Team consult revealed the following>>  CXR (clear, NAD) & LABS (all essent wnl x FLP as below)...  CT Brain w/ sm vessel dis, NAD...  MRI/ MRA Brain: neg for acute infarct, mild chr microvasc ischemia, normal variant anatomy of the circle of willis, no signif intracranial abn...  CDopplers were neg- no signif ICA stenoses...  2DEcho revealed mild LVH, norm wall motion w/o regional wall motion abn, EF=60%, no cardiac source of emboli apparent... DrSethi changed her ASA325/d to PLAVIX 75mg /d & wanted her to have an outpt Holter Monitor to r/o PAF (recall Hx below of syncope in 2007 w/ neg 60mo implanted loop recorder); they also started pt on Lipitor20mg   even though she was supposedly INTOL to all statins+Zetia as noted below;  We will call in 90d supplies of her Plavix & Lipitor, she will continue on the CoQ10 OTC... She has not had any additional or recurrent problems since disch...  ~  October 17, 2011:  47mo ROV & post hospital check> she was re-admitted 2/5 - 07/30/11 by [facility] w/ pain in right groin/ upper leg/ inguinal area that was very confusing & of uncertain etiology but she had difficulty bearing wt/ walking;  Abd Sonar showed s/p GB, prominent CBD, increased liver echo texture, NAD;  CT Abd & Pelvis was neg x FLD, Divertics, absent uterus, scat calcif in Ao/ vessels;  XRay of right hip was neg;  MRI Lumbar Spine showed numerous abnormalities including L4-5sp stenosis, right lat recess & foraminal stenosis, sm right paracentral disc protrusion; post op changes on left L5-S1, L5 foraminal stenosis, mod lumbar facet degen;  She was given PREDNISONE w/ improvement according to the DC Summary...    Discharge note & Med Reconciliation carefully reviewed & she was disch on the same meds BUT SHE IS ADAMANT THAT THEY STOPPED HER Atorvastatin, Calcium, Plavix, & Pantoprazole> she has not been taking any of these meds since discharge!  She was supposed to f/u w/ DrDBrodie but she says DrPyrtle said it was not necessary;  I reviewed the Scan & XRay results w/ Lumbar Sp findings & I rec f/u by Ortho to address these issues & the upper leg/ inguinal pain...    She requests TDAP today- OK...  We have requested her to re-start the Plavix 75mg /d, & Atorvastatin 20mg /d that were started by DrSethi during the prev hosp for TIA...  We have further requested that she see her Orthopedist regarding the findings on her MRI Lumbar spine & the pain in her right upper leg/ inguinal area...  Finally she is asked to follow up w/ DrDBrodie as planned for her ?fatty liver dis etc...  ~  January 04, 2012:  47mo ROV & Dorotha says "I'm better" & tells me about caring for her new twin great  grandchildren... In reviewing her meds> she has re-started the Plavix but was taking Omep20 since it was free from her insurance company; I indicated that the Omep may inactivate the Plavix & the safest PPI for her is PROTONIX 40mg /d (re-written for pt)... She has NOT restarted the Lipitor & is trying to control Lipids w/ diet alone... She has not yet returned to see her Orthopedic specialist or her Gastroenterologist either...    We reviewed prob list, meds, xrays and labs> see below>>   Problem List:    ALLERGIC RHINITIS - she states that saline nasal spray dries her eyes out & wants diff inhaler> rec OMNARIS  2sp Qhs...  HYPERTENSION (ICD-401.9) - off Norvasc & Diltiazem, on LASIX 20mg /d, K28mEqBid... ~  8/12: BP=136/92 =>130/80 after rest today and she denies HA, fatigue, visual changes, CP, dyspnea, edema, etc; she notes occas palpitations...  ~  1/13: BP= 146/92 but she notes better at home & she is reminded to elim sodium, get on diet & get weight down... ~  1/13 post-hosp: BP= 132/84 & she feels back to baseline... ~  4/13: BP= 122/80 & she is feeling better she says... ~  7/13: BP= 124/82 & she is stable...  CORONARY ARTERY DISEASE (ICD-414.00) - hx non-obstructive CAD followed by DrTaylor Antony Haste... she denies CP, palpit, ch in SOB, edema, etc... ~  cath 1996 showed 20-30% lesions in the Circ & RCA, EF=60%... ~  NuclearStressTest 10/07 showed no scar or ischemia, ?EF=37%?... ~  2DEcho 10/07 showed no regional wall motion changes, EF=60-65%... valves OK, some DD noted. ~  repeat cath 12/09 showed similar findings w/ nonobstructive plaque, EF= 65%... ~  1/13:  2DEcho showed mild LVH, norm wall motion w/o regional wall motion abn, EF=60%, no cardiac source of emboli apparent...  Hx of SYNCOPE (ICD-780.2) - hosp in 2007 for syncope and no etiology discovered... implanted loop recorder placed for 18 months w/o events, then 2 spells after the recorder was removed!!!... rehosp 4/08 w/ neg  studies at that time too... repeat eval 11/09 & nothing found... Dx w/ Carotid Sinus Hypersensitivity by DrTaylor... ~  CDopplers 10/07 showed mild plaque, 0-39% bilat ICA... ~  neg TiltTable study 4/08... ~  6/10: notes occas palpit, dizzy- uses Meclizine Prn... saw DrTaylor 7/10- rec diet + exerc. ~  No further syncope or near-syncope by her report... ~  CDopplers 1/13 showed neg- no signif ICA stenoses...  HYPERLIPIDEMIA (ICD-272.4) - she has proven INTOL to all meds & was "released" from the Lipid[facility]inic> we discussed diet + exercise again... ~  FLP 3/09 showed TChol 139, TG 74, HDL 41, LDL 83... continue med + diet therapy...  ~  FLP 11/09 showed TChol 99, TG 74, HDL 30, LDL 54 ~  FLP 6/10 on Simva80 showed TChol 141, TG 90, HDL 34, LDL 89... note: LFT's sl elevated... ~  FLP 12/10 off Simva showed TChol 279, TG 178, HDL 33, LDL 208... rec> refer to Lipid Clinic (she never went). ~  FLP 6/11 back on Simva80 showed TChol 154, TG 107, HDL 37, LDL 96... needs LC eval to ch med. ~  FLP 12/11 off meds showed TChol 270, TG 145, HDL 39, LDL 213... rec to return to [facility] for on-going care. ~  Lipid Clinic tried Crestor 5mg  but she states INTOL & "they released me" according to the pt... ~  8/12:  Try Zetia 10mg /d along w/ better diet, exercise, wt reduction => she states INTOL to Zetia as well... ~  1/13: FLP on diet alone showed TChol 234, TG 147, HDL 37, LDL 163... She will continue to work on diet & exercise... ~  1/13 post hosp:  DrSethi started pt on LIPITOR 20mg /d + CoQ10 100mg /d after her TIA "so far so good" ~  4/13:  Pt stopped the Atorva20 after her 2/13 hosp for right leg/inguinal area pain; she swears she was told to stop it by the doctors but all notes indicate she was getting it in the hosp & discharged on it as well;  She is asked to restart it now... ~  7/13:  She never restarted the Lipitor & prefers diet alone...  OBESITY (ICD-278.00) -  We discussed diet + exercise program... she  states that DrOz says you can gain weight while hardly eating any food & she thinks she isn't eating enough food...  ~  weight 12/09 = 212# ~  weight 6/10 = 219# ~  weight 12/10 = 217# ~  weight 6/11 = 215# ~  weight 12/11 = 216#.Marland Kitchen we discussed diet + exercise. ~  Weight 8/12 = 213# ~  Weight 1/13 = 210# ~  Weight 4/13 & 7/13 = 202#  DIVERTICULOSIS OF COLON (ICD-562.10) - she needs f/u eval DrDBrodie- ?when was last colonoscopy? ~  CT Abd 9/10  done for Abd pain- showed divertics & hep steatosis, retained stool & DJD sp... ~  CT Abd&Pelvis 2/13 showed smHH, diffuse low attenuation of liver suggests fatty infiltration, diverticulosis, scattered atherosclerotic calcif in Ao & branches, multilevel degen changes in the spine...  FATTY LIVER DISEASE (ICD-571.8) - hx elevated LFT's x yrs and eval DrDBrodie in past w/ rec to diet/ exerc/ weight reduction... ~  labs 6/10 showed SGOT= 89, SGPT= 65 ~  labs 12/10 showed SGOT= 65, SGPT= 54 ~  labs 6/11 showed SGOT= 119, SGPT= 76... needs re-eval by GI ~  labs 12/11 showed SGOT= 53, SGPT= 44 ~  Labs 1/13 showed SGOT= 53, SGPT= 41 ~  Labs 2/13 showed SGOT= 39, SGPT= 25  LOW BACK PAIN, CHRONIC (ICD-724.2) - s/p lumbar surg in 1989 by DrBotero... ~  4/13:  She denies back pain, but has right leg pain in the upper leg/ inguinal area & had difficulty walking & wt bearing... MRI Lumbar Spine 2/13 revealed   FIBROMYALGIA (ICD-729.1)  OSTEOPENIA (ICD-733.90) - ?prev BMD from GYN... on Calcium, MVI, VitD... ~  BMD here 3/09 showed TScores -0.7 in Spine, and -1.6 in left FemNeck ~  6/11:  f/u BMD showed TScores -0.7 in Spine, and -1.1 in right FemNeck  DIZZINESS (ICD-780.4) - she takes MECLIZINE 25mg  Bid... ~  s/p ENT eval by Gayla Medicus 2/08 felt to be benign positional vertigo, Rx w/ meclizine...  ~  she cancelled a planned neuro evaluation 4/08...  TIA >> SEE ABOVE in-patient eval 1/13 by [facility] & Stroke team... ASA stopped & pt placed on PLAVIX 75mg /d... ~   4/13:  Pt reports that she was told during her 2/13 Hosp to stop the Plavix, but she was given this med daily while in the [facility] & it is on the disch med list;  She stopped it at discharge & was off it for 6=weeks w/o ill effects;  She is rec to re-start the PLAVIX 75mg /d for stroke prevention...  ANXIETY (ICD-300.00) - on XANAX 0.5mg Tid prn... ~  8/12:  She is c/o insomnia & the Xanax doesn't help she said; rec trial Restoril 30mg Qhs prn...  SKIN CANCER - s/o Moh's surg for BCCa left cheek...  HEALTH MAINT--- GYN= prev DrFore and needs new Gyn now... [facility]... had neg FlexSig 1991 DrBrodie and needs routine colonoscopy scheduled... states she uses olive oil on her dry skin per DrOz... had Pneumovax 2008, & 2010 Flu shot in Oct.   Current Medications, Allergies, Past Medical History, Past Surgical History, Family History, and Social History were reviewed in [employer] record.   Past Surgical History  Procedure Date  . Tonsillectomy and adenoidectomy   . Total abdominal hysterectomy w/ bilateral salpingoophorectomy   . Cholecystectomy   . Back surgery   . Mohs surgery     Outpatient Encounter Prescriptions as of 01/04/2012  Medication Sig  Dispense Refill  . ALPRAZolam (XANAX) 0.5 MG tablet Take 1/2 to 1 tablet by mouth three times daily as needed for nerves  (MAX OF 3 TABLETS PER DAY)  90 tablet  5  . clopidogrel (PLAVIX) 75 MG tablet Take 1 tablet (75 mg total) by mouth daily with breakfast.  90 tablet  3  . fluticasone (CUTIVATE) 0.05 % cream Apply 1 application topically as needed. Apply to toe      . furosemide (LASIX) 20 MG tablet Take 1 tablet (20 mg total) by mouth daily.  90 tablet  3  . pantoprazole (PROTONIX) 40 MG tablet Take 1 tablet (40 mg total) by mouth daily. 30 minutes prior to a meal  30 tablet  11  . potassium chloride SA (K-DUR,KLOR-CON) 20 MEQ tablet Take 1 tablet (20 mEq total) by mouth 2 (two) times daily.  180 tablet  3  .  famotidine (PEPCID) 20 MG tablet Take 1 tablet (20 mg total) by mouth at bedtime.  30 tablet  11  . DISCONTD: atorvastatin (LIPITOR) 20 MG tablet Take 1 tablet (20 mg total) by mouth daily.  90 tablet  3    Allergies  Allergen Reactions  . Codeine     REACTION: nausea  . Diltiazem Hcl     REACTION: pt states it made her dizzy...  . Sulfonamide Derivatives     REACTION: red/splotchy hives  . Tramadol Itching    Current Medications, Allergies, Past Medical History, Past Surgical History, Family History, and Social History were reviewed in [employer] record.    Review of Systems       See HPI - all other systems neg except as noted...       The patient complains of dyspnea on exertion.  The patient denies anorexia, fever, weight loss, weight gain, vision loss, decreased hearing, hoarseness, chest pain, syncope, peripheral edema, prolonged cough, headaches, hemoptysis, abdominal pain, melena, hematochezia, severe indigestion/heartburn, hematuria, incontinence, muscle weakness, suspicious skin lesions, transient blindness, difficulty walking, depression, unusual weight change, abnormal bleeding, enlarged lymph nodes, and angioedema.     Objective:   Physical Exam     WD, Obese, 71 y/o WF in NAD... GENERAL:  Alert & oriented; pleasant & cooperative... HEENT:  [place]/AT, EOM-wnl,  PERRLA, EACs- wax, TMs-wnl, NOSE-pale, THROAT-clear & wnl. NECK:  Supple w/ fairROM; no JVD; normal carotid impulses w/o bruits; no thyromegaly or nodules palpated; no lymphadenopathy. CHEST:  Clear BS bilaterally without wheezing, rales, or signs of consolidation... HEART:  Regular Rhythm; without murmurs/ rubs/ or gallops heard... ABDOMEN:  Soft & nontender; normal bowel sounds; no organomegaly or masses detected. EXT: without deformities, mild arthritic changes; no varicose veins/ +venous insuffic/ tr edema... NEURO:  CN's intact, no focal neuro deficits... DERM:  scar on left cheek, no  lesions noted...  RADIOLOGY DATA:  Reviewed in the EPIC EMR & discussed w/ the patient...  LABORATORY DATA:  Reviewed in the EPIC EMR & discussed w/ the patient...   Assessment & Plan:   TIA>>  See above work up by [facility] & Stroke Team 1/13 Hosp; no source of the TIA found & pt was switched from ASA 325mg /d to PLAVIX 75mg /d...  HBP>  Control is fair on LASIX;  diet w/ weight reduction stressed to pt or we may need to add meds.  CAD>  Followed by DrTaylor for Cards now on PLAVIX; denies angina etc but she is too sedentary & needs incr exercise etc...  Hx Syncope>  Extensive eval by Cards,  DrTaylor- dx w/ carotid sinus hypersensitivity; no recurrent episodes she says; she uses Meclizine prn dizziness...  HYPERLIPID>  A signif problem for her but INTOL to all statins and the Lipid Clinic has "released her" she says; she was also intol to Zetia & she has decided to forgo med rx & concentrate on diet alone>> then she had the TIA & DrSethi insisted on ATORVASTATIN20mg  which she is currently on & tolerating ok...  OBESITY>  We reviewed the needed diet, exercise & wt reduction programs...  GI> Divertics>  She needs f/u DrDBrodie & consideration of f/u colonoscopy...  Fatty Liver Disease>  Hx elev LFTs and CT Abd 9/10 & 2/13 w/ hepatic steatosis; we discussed the need for weight reduction & she will f/u w/ GI...  ORTHO> LBP, FM, Osteopenia>  Prev lumbar surg from DrBotero, she uses OTC analgesics, calcium, MVI... Due to right leg pain she will need Ortho eval & she will call DrGraves for this purpose...  Anxiety>  On Xanax as needed & we will give her a trial of RESTORIL 30mg  Qhs for insomnia...   Patient's Medications  New Prescriptions   No medications on file  Previous Medications   CLOPIDOGREL (PLAVIX) 75 MG TABLET    Take 1 tablet (75 mg total) by mouth daily with breakfast.   FAMOTIDINE (PEPCID) 20 MG TABLET    Take 1 tablet (20 mg total) by mouth at bedtime.   FLUTICASONE (CUTIVATE)  0.05 % CREAM    Apply 1 application topically as needed. Apply to toe   FUROSEMIDE (LASIX) 20 MG TABLET    Take 1 tablet (20 mg total) by mouth daily.   POTASSIUM CHLORIDE SA (K-DUR,KLOR-CON) 20 MEQ TABLET    Take 1 tablet (20 mEq total) by mouth 2 (two) times daily.  Modified Medications   Modified Medication Previous Medication   ALPRAZOLAM (XANAX) 0.5 MG TABLET ALPRAZolam (XANAX) 0.5 MG tablet      Take 1/2 to 1 tablet by mouth three times daily as needed for nerves  (MAX OF 3 TABLETS PER DAY)    Take 1/2 to 1 tablet by mouth three times daily as needed for nerves  (MAX OF 3 TABLETS PER DAY)   PANTOPRAZOLE (PROTONIX) 40 MG TABLET pantoprazole (PROTONIX) 40 MG tablet      Take 1 tablet (40 mg total) by mouth daily. 30 minutes prior to a meal    Take 1 tablet (40 mg total) by mouth daily. 30 minutes prior to a meal  Discontinued Medications   ATORVASTATIN (LIPITOR) 20 MG TABLET    Take 1 tablet (20 mg total) by mouth daily.

## 2012-01-04 NOTE — Patient Instructions (Addendum)
Today we updated your med list in our EPIC system...    Continue your current medications the same...  We wrote a new prescription for the PROTONIX (Pantoprazole) 40mg  one daily take 30 min prior to a meal daily...     This is the best acid suppressor for your system due to the Plavix you are taking...  Call for any questions...  Let's plan a follw up visit w/ FASTING blood work in 6 months.Marland KitchenMarland Kitchen

## 2012-03-29 ENCOUNTER — Ambulatory Visit (INDEPENDENT_AMBULATORY_CARE_PROVIDER_SITE_OTHER): Payer: Medicare Other

## 2012-03-29 DIAGNOSIS — Z23 Encounter for immunization: Secondary | ICD-10-CM

## 2012-03-30 DIAGNOSIS — Z23 Encounter for immunization: Secondary | ICD-10-CM

## 2012-04-09 ENCOUNTER — Telehealth: Payer: Self-pay | Admitting: Pulmonary Disease

## 2012-04-09 MED ORDER — MAGIC MOUTHWASH: ORAL | Status: DC

## 2012-04-09 MED ORDER — AZELASTINE-FLUTICASONE 137-50 MCG/ACT NA SUSP: 1.0000 | Freq: Every day | NASAL | Status: DC

## 2012-04-09 MED ORDER — AZITHROMYCIN 250 MG PO TABS: ORAL_TABLET | ORAL | Status: DC

## 2012-04-09 MED ORDER — AZELASTINE-FLUTICASONE 137-50 MCG/ACT NA SUSP: 1.0000 | Freq: Two times a day (BID) | NASAL | Status: DC

## 2012-04-09 NOTE — Telephone Encounter (Signed)
I spoke with pt and she c/o sinus congestion, PND, sore throat,sweats (unknown fever). Slight sinus pressure/HA x yesterday. She is requesting to have something called in for this. Please advise SN thanks  Allergies  Allergen Reactions  . Codeine     REACTION: nausea  . Diltiazem Hcl     REACTION: pt states it made her dizzy...  . Sulfonamide Derivatives     REACTION: red/splotchy hives  . Tramadol Itching

## 2012-04-09 NOTE — Telephone Encounter (Signed)
(  continued) Pharmacy stated that if SN would like MMW prescribed, then just send Rx stating MMW standard.  Antionette Fairy

## 2012-04-09 NOTE — Telephone Encounter (Signed)
Called pharmacist and notified MMW is standard Please advise alternative to dymista, her ins does not cover this, thanks Allergies  Allergen Reactions  . Codeine     REACTION: nausea  . Diltiazem Hcl     REACTION: pt states it made her dizzy...  . Sulfonamide Derivatives     REACTION: red/splotchy hives  . Tramadol Itching

## 2012-04-09 NOTE — Telephone Encounter (Signed)
rx sent and pt is aware. Jennifer Castillo, CMA  

## 2012-04-09 NOTE — Telephone Encounter (Signed)
Pt is aware dymista is not covered by insurance and will be changed to asteline. She is aware of directions. Rx has been sent. Nothing further was needed

## 2012-04-09 NOTE — Telephone Encounter (Signed)
Per SN---  1.  dymista  1-2 sprays each nostril bid 2.  zpak #1  Take as directed 3.  For the sore throat---MMW  #4oz  1 tsp gargle and swallow qid 4.  Use mucinex 600 mg  otc  1-2 bid with plenty of fluids. thanks

## 2012-04-09 NOTE — Telephone Encounter (Signed)
Per SN---yes MMW is standard and change the dymista to asteline 1-2 sprays each nostril bid.  thanks

## 2012-06-07 ENCOUNTER — Other Ambulatory Visit: Payer: Self-pay | Admitting: Pulmonary Disease

## 2012-06-07 DIAGNOSIS — Z1231 Encounter for screening mammogram for malignant neoplasm of breast: Secondary | ICD-10-CM

## 2012-06-08 ENCOUNTER — Ambulatory Visit: Payer: Medicare Other

## 2012-07-06 ENCOUNTER — Ambulatory Visit (INDEPENDENT_AMBULATORY_CARE_PROVIDER_SITE_OTHER): Payer: Medicare Other | Admitting: Pulmonary Disease

## 2012-07-06 ENCOUNTER — Encounter: Payer: Self-pay | Admitting: Pulmonary Disease

## 2012-07-06 ENCOUNTER — Other Ambulatory Visit (INDEPENDENT_AMBULATORY_CARE_PROVIDER_SITE_OTHER): Payer: Medicare Other

## 2012-07-06 VITALS — BP 126/72 | HR 75 | Temp 97.6°F | Ht 63.0 in | Wt 198.6 lb

## 2012-07-06 DIAGNOSIS — F411 Generalized anxiety disorder: Secondary | ICD-10-CM

## 2012-07-06 DIAGNOSIS — M949 Disorder of cartilage, unspecified: Secondary | ICD-10-CM

## 2012-07-06 DIAGNOSIS — K5730 Diverticulosis of large intestine without perforation or abscess without bleeding: Secondary | ICD-10-CM

## 2012-07-06 DIAGNOSIS — M79604 Pain in right leg: Secondary | ICD-10-CM

## 2012-07-06 DIAGNOSIS — E669 Obesity, unspecified: Secondary | ICD-10-CM

## 2012-07-06 DIAGNOSIS — M545 Low back pain: Secondary | ICD-10-CM

## 2012-07-06 DIAGNOSIS — IMO0001 Reserved for inherently not codable concepts without codable children: Secondary | ICD-10-CM

## 2012-07-06 DIAGNOSIS — G459 Transient cerebral ischemic attack, unspecified: Secondary | ICD-10-CM

## 2012-07-06 DIAGNOSIS — M797 Fibromyalgia: Secondary | ICD-10-CM

## 2012-07-06 DIAGNOSIS — E785 Hyperlipidemia, unspecified: Secondary | ICD-10-CM

## 2012-07-06 DIAGNOSIS — M899 Disorder of bone, unspecified: Secondary | ICD-10-CM

## 2012-07-06 DIAGNOSIS — J309 Allergic rhinitis, unspecified: Secondary | ICD-10-CM

## 2012-07-06 DIAGNOSIS — I10 Essential (primary) hypertension: Secondary | ICD-10-CM

## 2012-07-06 DIAGNOSIS — K7689 Other specified diseases of liver: Secondary | ICD-10-CM

## 2012-07-06 DIAGNOSIS — I2510 Atherosclerotic heart disease of native coronary artery without angina pectoris: Secondary | ICD-10-CM

## 2012-07-06 LAB — BASIC METABOLIC PANEL
BUN: 15 mg/dL (ref 6–23)
CO2: 29 mEq/L (ref 19–32)
Chloride: 106 mEq/L (ref 96–112)
Creatinine, Ser: 0.8 mg/dL (ref 0.4–1.2)
Glucose, Bld: 109 mg/dL — ABNORMAL HIGH (ref 70–99)
Potassium: 3.9 mEq/L (ref 3.5–5.1)

## 2012-07-06 LAB — HEMOGLOBIN A1C: Hgb A1c MFr Bld: 5.1 % (ref 4.6–6.5)

## 2012-07-06 LAB — CBC WITH DIFFERENTIAL/PLATELET
Basophils Relative: 0.3 % (ref 0.0–3.0)
Eosinophils Absolute: 0 10*3/uL (ref 0.0–0.7)
Eosinophils Relative: 1.7 % (ref 0.0–5.0)
Lymphocytes Relative: 34.9 % (ref 12.0–46.0)
Monocytes Absolute: 0.1 10*3/uL (ref 0.1–1.0)
Neutrophils Relative %: 58.1 % (ref 43.0–77.0)
Platelets: 142 10*3/uL — ABNORMAL LOW (ref 150.0–400.0)
RBC: 2.85 Mil/uL — ABNORMAL LOW (ref 3.87–5.11)
WBC: 2.9 10*3/uL — ABNORMAL LOW (ref 4.5–10.5)

## 2012-07-06 LAB — HEPATIC FUNCTION PANEL
ALT: 28 U/L (ref 0–35)
AST: 32 U/L (ref 0–37)
Total Protein: 7.3 g/dL (ref 6.0–8.3)

## 2012-07-06 LAB — TSH: TSH: 2.63 u[IU]/mL (ref 0.35–5.50)

## 2012-07-06 LAB — LIPID PANEL: Cholesterol: 201 mg/dL — ABNORMAL HIGH (ref 0–200)

## 2012-07-06 MED ORDER — FLUTICASONE PROPIONATE 0.05 % EX CREA: 1.0000 "application " | TOPICAL_CREAM | CUTANEOUS | Status: DC | PRN

## 2012-07-06 NOTE — Patient Instructions (Addendum)
Today we updated your med list in our EPIC system...    Continue your current medications the same...    We refilled your meds per request...  Today we did your follow up FASTING blood work...    We will contact you w/ the results when avail...  Call for any questions...  Let's plan a brief f/u visit in 6 months.Marland KitchenMarland Kitchen

## 2012-07-07 ENCOUNTER — Encounter: Payer: Self-pay | Admitting: Pulmonary Disease

## 2012-07-07 LAB — VITAMIN D 25 HYDROXY (VIT D DEFICIENCY, FRACTURES): Vit D, 25-Hydroxy: 21 ng/mL — ABNORMAL LOW (ref 30–89)

## 2012-07-07 NOTE — Progress Notes (Signed)
Subjective:    Patient ID: [name], female    DOB: 1941/02/22, 72 y.o.   MRN: [id number]  HPI 72 y/o WF here for a follow up visit... she has mult med problems as noted below... Followed for general medical purposes w/ hx HBP, CAD & hx Syncope followed by DrTaylor, Hyperlipidemia, Obesity, Fatty Liver disease, LBP/ FM/ Osteopenia, & Anxiety...  ~  July 05, 2011:  767mo ROV & she is c/o dry cough, & a sl sore throat x1d for which she would like a prescription for MMW; also notes several skin cancers removed by Derm & she has f/u appt soon...    >HBP controlled w/ Lasix20 + K20Bid; BP= 146/92 here now but better at home she says& she is reminded to elim sodium, get on diet & get wt down...     >Cards followed by DrTaylor for nonobstructive CAD, DD, Hx syncope & carotid sinus hypersensitivity per DrTaylor; on ASA325mg /d...    >She has severe hyperlipidemia & has been INTOL to all meds; last tried Zetia but she stopped this too; she notes that the Lipid clinic gave up on her and "they released me" so now she is on diet alone + FishOil "I'll do the best I can", and she notes "It's hereditary per DrTaylor"; see FLP results below>>    >Obesity is a constant battle; weight= 210#, 63" tall, BMI=37-38; we reviewed low carb low fat weight reducing diet & exercise program...    >Ortho> FM, LBP, Osteopenia, etc...    >Anxiety on Xanax prn...  ~  July 18, 2011:  2wk ROV & post hospital visit> Extensive hosp records reviewed> she had a TIA 07/13/11 characterized by sudden left sided HA, blurry vision, right leg weakness, slurring of speech & inability to find words> all signs/ symptoms cleared over a 2-3H period; eval in ER & short admission by [facility] w/ Neuro/ Stroke Team consult revealed the following>>  CXR (clear, NAD) & LABS (all essent wnl x FLP as below)...  CT Brain w/ sm vessel dis, NAD...  MRI/ MRA Brain: neg for acute infarct, mild chr microvasc ischemia, normal variant anatomy of the circle  of willis, no signif intracranial abn...  CDopplers were neg- no signif ICA stenoses...  2DEcho revealed mild LVH, norm wall motion w/o regional wall motion abn, EF=60%, no cardiac source of emboli apparent... DrSethi changed her ASA325/d to PLAVIX 75mg /d & wanted her to have an outpt Holter Monitor to r/o PAF (recall Hx below of syncope in 2007 w/ neg 24mo implanted loop recorder); they also started pt on Lipitor20mg  even though she was supposedly INTOL to all statins+Zetia as noted below;  We will call in 90d supplies of her Plavix & Lipitor, she will continue on the CoQ10 OTC... She has not had any additional or recurrent problems since disch...  ~  October 17, 2011:  67mo ROV & post hospital check> she was re-admitted 2/5 - 07/30/11 by [facility] w/ pain in right groin/ upper leg/ inguinal area that was very confusing & of uncertain etiology but she had difficulty bearing wt/ walking;  Abd Sonar showed s/p GB, prominent CBD, increased liver echo texture, NAD;  CT Abd & Pelvis was neg x FLD, Divertics, absent uterus, scat calcif in Ao/ vessels;  XRay of right hip was neg;  MRI Lumbar Spine showed numerous abnormalities including L4-5sp stenosis, right lat recess & foraminal stenosis, sm right paracentral disc protrusion; post op changes on left L5-S1, L5 foraminal stenosis, mod lumbar facet degen;  She was given PREDNISONE w/ improvement according to the DC Summary...    Discharge note & Med Reconciliation carefully reviewed & she was disch on the same meds BUT SHE IS ADAMANT THAT THEY STOPPED HER Atorvastatin, Calcium, Plavix, & Pantoprazole> she has not been taking any of these meds since discharge!  She was supposed to f/u w/ DrDBrodie but she says DrPyrtle said it was not necessary;  I reviewed the Scan & XRay results w/ Lumbar Sp findings & I rec f/u by Ortho to address these issues & the upper leg/ inguinal pain...    She requests TDAP today- OK...  We have requested her to re-start the Plavix 75mg /d, &  Atorvastatin 20mg /d that were started by DrSethi during the prev hosp for TIA...  We have further requested that she see her Orthopedist regarding the findings on her MRI Lumbar spine & the pain in her right upper leg/ inguinal area...  Finally she is asked to follow up w/ DrDBrodie as planned for her ?fatty liver dis etc...  ~  January 04, 2012:  68mo ROV & [name] says "I'm better" & tells me about caring for her new twin great grandchildren... In reviewing her meds> she has re-started the Plavix but was taking Omep20 since it was free from her insurance company; I indicated that the Omep may inactivate the Plavix & the safest PPI for her is PROTONIX 40mg /d (re-written for pt)... She has NOT restarted the Lipitor & is trying to control Lipids w/ diet alone... She has not yet returned to see her Orthopedic specialist or her Gastroenterologist either...    We reviewed prob list, meds, xrays and labs> see below>>  ~  July 06, 2012:  31mo ROV & [name] indicates that she is doing well- no new complaints or concerns; she credits her love & care for her twin grandchildren for her good health & positive attitude...  We reviewed the following medical problems during today's office visit >>      AR> on Dymista & OTC Antihist prn; she had tube placed right ear by DrCNewman- imroved by her hx...    HBP> on Lasix20 + K20; BP= 126/72 & she denies CP, palpit, dizzy, SOB, edema, etc...    CAD> on Plavix75; she denies angina, palpit, etc; last seen by Cards 2009 w/ cath showing mild non-obstructive dis...    Syncope> she had loop recorder 4/08 to11/09 w/o tachy or brady events & no recurrent symptoms; then she had another syncopal spell within days of recorder explantation; DrTaylor diagnosed w/ carotis sinus hypersensitivity...    CHOL> on diet alone; intol to all meds, prev followed in [facility]id Clinic until released; FLP on diet alone showed TChol 201, TG 89, HDL 39, LDL 130 (Improved)...    Obese> weight= 199# which is  down 3# in last 31mo; we reviewed diet, exercise, wt reduction strategies...    GI- GERD, Divertics> on Protonix40; she denies abd pain, n/v, d/c, blood seen...    Hx FLD> LFTs now wnl & improved from 2012; Abd Sonar 2/13 w/ fatty infiltration, s/p GB, some biliary dilatation; CT Abd 2/13 w/ fatty liver, prominent CBD, divertics, s/p GB & hyst, atherosclerotic calcif & multilevel degen changes...    Ortho- LBP, FM> s/p LLam in 1989 by DrBotero; she notes she is able to lift & carry her grandchildren & doing satis...     Osteopenia> on Ca+, MVI, VitD- but VitD level still low at 21 & we will Rx w. VitD 50K weekly; last  BMD 6/11 was improved...    Hx TIA> on Plavix75; she denies cerebral ischemic symptoms...    Anxiety> on Alpraz0.5 prn; she is less nervous & resting better since caring for her twin grandchildren...    PANCYTOPENIA> labs 1/14 showed Hg=11.7, MCV=121, WBC=2.9, Plat=142K; last labs 2/13 were all wnl (MCV=102); she is to ret for Anemia eval=> pending.  We reviewed prob list, meds, xrays and labs> see below for updates >> she had the 2013 Flu vaccine 10/13... LABS 1/14:  FLP- not at goals on diet alone (intol all meds);  Chems- ok w/ BS=109 A1c=5.1;  CBC- mild pancytopenia w/ MCV=121;  TSH=2.63;  VitD=21...       Problem List:    ALLERGIC RHINITIS - she states that saline nasal spray dries her eyes out & wants diff inhaler> rec OMNARIS 2sp Qhs... ~  1/14> on Dymista & OTC Antihist prn; she had tube placed right ear by DrCNewman- imroved by her hx...   HYPERTENSION (ICD-401.9) - off Norvasc & Diltiazem, on LASIX 20mg /d, K17mEqBid... ~  8/12: BP=136/92 =>130/80 after rest today and she denies HA, fatigue, visual changes, CP, dyspnea, edema, etc; she notes occas palpitations...  ~  1/13: BP= 146/92 but she notes better at home & she is reminded to elim sodium, get on diet & get weight down... ~  1/13 post-hosp: BP= 132/84 & she feels back to baseline... ~  4/13: BP= 122/80 & she is  feeling better she says... ~  7/13: BP= 124/82 & she is stable... ~  1/14: on Lasix20 + K20; BP= 126/72 & she denies CP, palpit, dizzy, SOB, edema, etc...  CORONARY ARTERY DISEASE (ICD-414.00) - hx non-obstructive CAD followed by DrTaylor Antony Haste... she denies CP, palpit, ch in SOB, edema, etc... ~  cath 1996 showed 20-30% lesions in the Circ & RCA, EF=60%... ~  NuclearStressTest 10/07 showed no scar or ischemia, ?EF=37%?... ~  2DEcho 10/07 showed no regional wall motion changes, EF=60-65%... valves OK, some DD noted. ~  repeat cath 12/09 showed similar findings w/ nonobstructive plaque, EF= 65%... ~  1/13:  2DEcho showed mild LVH, norm wall motion w/o regional wall motion abn, EF=60%, no cardiac source of emboli apparent...  Hx of SYNCOPE (ICD-780.2) - hosp in 2007 for syncope and no etiology discovered... implanted loop recorder placed for 18 months (4/08 to11/09) w/o events, then 2 spells within a few days after the recorder was removed!!!... rehosp 4/08 w/ neg studies at that time too... repeat eval 11/09 & nothing found... Dx w/ Carotid Sinus Hypersensitivity by DrTaylor... ~  CDopplers 10/07 showed mild plaque, 0-39% bilat ICA... ~  neg TiltTable study 4/08... ~  6/10: notes occas palpit, dizzy- uses Meclizine Prn... saw DrTaylor 7/10- rec diet + exerc. ~  No further syncope or near-syncope by her report> she notes dizziness if she tilts her head back "it's cardio-depressor dizziness" she says- no syncope... ~  CDopplers 1/13 showed neg- no signif ICA stenoses...  HYPERLIPIDEMIA (ICD-272.4) - she has proven INTOL to all meds & was "released" from the [facility]> we discussed diet + exercise again... ~  FLP 3/09 showed TChol 139, TG 74, HDL 41, LDL 83... continue med + diet therapy...  ~  FLP 11/09 showed TChol 99, TG 74, HDL 30, LDL 54 ~  FLP 6/10 on Simva80 showed TChol 141, TG 90, HDL 34, LDL 89... note: LFT's sl elevated... ~  FLP 12/10 off Simva showed TChol 279, TG 178, HDL 33, LDL  208... rec> refer to Lipid Clinic (  she never went). ~  FLP 6/11 back on Simva80 showed TChol 154, TG 107, HDL 37, LDL 96... needs LC eval to ch med. ~  FLP 12/11 off meds showed TChol 270, TG 145, HDL 39, LDL 213... rec to return to [facility] for on-going care. ~  Lipid Clinic tried Crestor 5mg  but she states INTOL & "they released me" according to the pt... ~  8/12:  Try Zetia 10mg /d along w/ better diet, exercise, wt reduction => she states INTOL to Zetia as well... ~  1/13: FLP on diet alone showed TChol 234, TG 147, HDL 37, LDL 163... She will continue to work on diet & exercise... ~  1/13 post hosp:  DrSethi started pt on LIPITOR 20mg /d + CoQ10 100mg /d after her TIA "so far so good" ~  4/13:  Pt stopped the Atorva20 after her 2/13 hosp for right leg/inguinal area pain; she swears she was told to stop it by the doctors but all notes indicate she was getting it in the hosp & discharged on it as well;  She is asked to restart it now... ~  7/13:  She never restarted the Lipitor & prefers diet alone... ~  FLP 1/14 on diet alone showed TChol 201, TG 89, HDL 39, LDL 130   OBESITY (ICD-278.00) - We discussed diet + exercise program... she states that DrOz says you can gain weight while hardly eating any food & she thinks she isn't eating enough food...  ~  weight 12/09 = 212# ~  weight 6/10 = 219# ~  weight 12/10 = 217# ~  weight 6/11 = 215# ~  weight 12/11 = 216#.Marland Kitchen we discussed diet + exercise. ~  Weight 8/12 = 213# ~  Weight 1/13 = 210# ~  Weight 4/13 & 7/13 = 202# ~  Weight 1/14 = 199#  DIVERTICULOSIS OF COLON (ICD-562.10) - she needs f/u eval DrDBrodie- ?when was last colonoscopy? ~  CT Abd 9/10  done for Abd pain- showed divertics & hep steatosis, retained stool & DJD sp... ~  CT Abd&Pelvis 2/13 showed smHH, diffuse low attenuation of liver suggests fatty infiltration, diverticulosis, scattered atherosclerotic calcif in Ao & branches, multilevel degen changes in the spine...  FATTY LIVER DISEASE  (ICD-571.8) - hx elevated LFT's x yrs and eval DrDBrodie in past w/ rec to diet/ exerc/ weight reduction... ~  labs 6/10 showed SGOT= 89, SGPT= 65 ~  labs 12/10 showed SGOT= 65, SGPT= 54 ~  labs 6/11 showed SGOT= 119, SGPT= 76... needs re-eval by GI ~  labs 12/11 showed SGOT= 53, SGPT= 44 ~  Labs 1/13 showed SGOT= 53, SGPT= 41 ~  Labs 2/13 showed SGOT= 39, SGPT= 25 ~  Labs 1/14 showed SOT= 32, SGPT= 28  LOW BACK PAIN, CHRONIC (ICD-724.2) - s/p lumbar surg in 1989 by DrBotero... ~  4/13:  She denies back pain, but has right leg pain in the upper leg/ inguinal area & had difficulty walking & wt bearing... MRI Lumbar Spine 2/13 revealed   FIBROMYALGIA (ICD-729.1)  OSTEOPENIA (ICD-733.90) - ?prev BMD from GYN... on Calcium, MVI, VitD... ~  BMD here 3/09 showed TScores -0.7 in Spine, and -1.6 in left FemNeck ~  6/11:  f/u BMD showed TScores -0.7 in Spine, and -1.1 in right FemNeck  DIZZINESS (ICD-780.4) - she takes MECLIZINE 25mg  Bid... ~  s/p ENT eval by Gayla Medicus 2/08 felt to be benign positional vertigo, Rx w/ meclizine...  ~  she cancelled a planned neuro evaluation 4/08...  TIA >> SEE  ABOVE in-patient eval 1/13 by [facility] & Stroke team... ASA stopped & pt placed on PLAVIX 75mg /d... ~  CDopplers 1/13 showed tortuous carotids w/ heterogen plaque in bulbs, no signif stenosis, vertebrals antegrade... ~  4/13:  Pt reports that she was told during her 2/13 Hosp to stop the Plavix, but she was given this med daily while in the [facility] & it is on the disch med list;  She stopped it at discharge & was off it for 6=weeks w/o ill effects;  She is rec to re-start the PLAVIX 75mg /d for stroke prevention... ~  1/14: on Plavix75; she denies cerebral ischemic symptoms...   ANXIETY (ICD-300.00) - on XANAX 0.5mg Tid prn... ~  8/12:  She is c/o insomnia & the Xanax doesn't help she said; rec trial Restoril 30mg Qhs prn...  SKIN CANCER - s/o Moh's surg for BCCa left cheek...  HEALTH MAINT--- GYN= prev DrFore and  needs new Gyn now... [facility]... had neg FlexSig 1991 DrBrodie and needs routine colonoscopy scheduled... states she uses olive oil on her dry skin per DrOz... had Pneumovax 2008, & she gets the yearly seasonal Flu vaccine...   Past Surgical History  Procedure Date  . Tonsillectomy and adenoidectomy   . Total abdominal hysterectomy w/ bilateral salpingoophorectomy   . Cholecystectomy   . Back surgery   . Mohs surgery     Outpatient Encounter Prescriptions as of 07/06/2012  Medication Sig Dispense Refill  . ALPRAZolam (XANAX) 0.5 MG tablet Take 1/2 to 1 tablet by mouth three times daily as needed for nerves  (MAX OF 3 TABLETS PER DAY)  90 tablet  5  . Alum & Mag Hydroxide-Simeth (MAGIC MOUTHWASH) SOLN Take 1 teaspoon gargle and swallow four times a day  120 mL  0  . Azelastine-Fluticasone 137-50 MCG/ACT SUSP Place 1-2 sprays into the nose daily.  23 g  5  . clopidogrel (PLAVIX) 75 MG tablet Take 1 tablet (75 mg total) by mouth daily with breakfast.  90 tablet  3  . fluticasone (CUTIVATE) 0.05 % cream Apply 1 application topically as needed. Apply to toe  30 g  5  . furosemide (LASIX) 20 MG tablet Take 1 tablet (20 mg total) by mouth daily.  90 tablet  3  . pantoprazole (PROTONIX) 40 MG tablet Take 1 tablet (40 mg total) by mouth daily. 30 minutes prior to a meal  90 tablet  3  . potassium chloride SA (K-DUR,KLOR-CON) 20 MEQ tablet Take 1 tablet (20 mEq total) by mouth 2 (two) times daily.  180 tablet  3  . [DISCONTINUED] fluticasone (CUTIVATE) 0.05 % cream Apply 1 application topically as needed. Apply to toe      . [DISCONTINUED] azithromycin (ZITHROMAX) 250 MG tablet As directed  6 each  0  . [DISCONTINUED] famotidine (PEPCID) 20 MG tablet Take 1 tablet (20 mg total) by mouth at bedtime.  30 tablet  11    Allergies  Allergen Reactions  . Codeine     REACTION: nausea  . Diltiazem Hcl     REACTION: pt states it made her dizzy...  . Sulfonamide Derivatives      REACTION: red/splotchy hives  . Tramadol Itching    Current Medications, Allergies, Past Medical History, Past Surgical History, Family History, and Social History were reviewed in [employer] record.    Review of Systems       See HPI - all other systems neg except as noted...       The patient  complains of dyspnea on exertion.  The patient denies anorexia, fever, weight loss, weight gain, vision loss, decreased hearing, hoarseness, chest pain, syncope, peripheral edema, prolonged cough, headaches, hemoptysis, abdominal pain, melena, hematochezia, severe indigestion/heartburn, hematuria, incontinence, muscle weakness, suspicious skin lesions, transient blindness, difficulty walking, depression, unusual weight change, abnormal bleeding, enlarged lymph nodes, and angioedema.     Objective:   Physical Exam     WD, Obese, 72 y/o WF in NAD... GENERAL:  Alert & oriented; pleasant & cooperative... HEENT:  [place]/AT, EOM-wnl,  PERRLA, EACs- wax, TMs-wnl, NOSE-pale, THROAT-clear & wnl. NECK:  Supple w/ fairROM; no JVD; normal carotid impulses w/o bruits; no thyromegaly or nodules palpated; no lymphadenopathy. CHEST:  Clear BS bilaterally without wheezing, rales, or signs of consolidation... HEART:  Regular Rhythm; without murmurs/ rubs/ or gallops heard... ABDOMEN:  Soft & nontender; normal bowel sounds; no organomegaly or masses detected. EXT: without deformities, mild arthritic changes; no varicose veins/ +venous insuffic/ tr edema... NEURO:  CN's intact, no focal neuro deficits... DERM:  scar on left cheek, no lesions noted...  RADIOLOGY DATA:  Reviewed in the EPIC EMR & discussed w/ the patient...  LABORATORY DATA:  Reviewed in the EPIC EMR & discussed w/ the patient...   Assessment & Plan:    HBP>  Control is fair on LASIX;  diet w/ weight reduction stressed to pt or we may need to add meds.  CAD>  Followed by DrTaylor for Cards now on PLAVIX; denies angina etc  but she is too sedentary & needs incr exercise etc...  Hx Syncope>  Extensive eval by Cards, DrTaylor- dx w/ carotid sinus hypersensitivity; no recurrent episodes she says; she uses Meclizine prn dizziness...  HYPERLIPID>  A signif problem for her but INTOL to all statins and the Lipid Clinic has "released her" she says; she was also intol to Zetia & she has decided to forgo med rx & concentrate on diet alone>> then she had the TIA & DrSethi insisted on ATORVASTATIN20mg  but she eventually stopped this too;  Now on diet alone 7 numbers are improved...  OBESITY>  We reviewed the needed diet, exercise & wt reduction programs...  GI> Divertics>  She needs f/u DrDBrodie & consideration of f/u colonoscopy...  Fatty Liver Disease>  Hx elev LFTs and CT Abd 9/10 & 2/13 w/ hepatic steatosis; we discussed the need for weight reduction & she will f/u w/ GI...  ORTHO> LBP, FM, Osteopenia>  Prev lumbar surg from DrBotero, she uses OTC analgesics, calcium, MVI... Due to right leg pain she will need Ortho eval & she will call DrGraves for this purpose...  Anxiety>  On Xanax as needed & we will give her a trial of RESTORIL 30mg  Qhs for insomnia...  PANCYTOPENIA> Labs 1/14 w/ new pancytopenia and MCV=121; she is to ret for additional labs- pending....   Patient's Medications  New Prescriptions   No medications on file  Previous Medications   ALPRAZOLAM (XANAX) 0.5 MG TABLET    Take 1/2 to 1 tablet by mouth three times daily as needed for nerves  (MAX OF 3 TABLETS PER DAY)   ALUM & MAG HYDROXIDE-SIMETH (MAGIC MOUTHWASH) SOLN    Take 1 teaspoon gargle and swallow four times a day   AZELASTINE-FLUTICASONE 137-50 MCG/ACT SUSP    Place 1-2 sprays into the nose daily.   CLOPIDOGREL (PLAVIX) 75 MG TABLET    Take 1 tablet (75 mg total) by mouth daily with breakfast.   FUROSEMIDE (LASIX) 20 MG TABLET    Take  1 tablet (20 mg total) by mouth daily.   PANTOPRAZOLE (PROTONIX) 40 MG TABLET    Take 1 tablet (40 mg total)  by mouth daily. 30 minutes prior to a meal   POTASSIUM CHLORIDE SA (K-DUR,KLOR-CON) 20 MEQ TABLET    Take 1 tablet (20 mEq total) by mouth 2 (two) times daily.  Modified Medications   Modified Medication Previous Medication   FLUTICASONE (CUTIVATE) 0.05 % CREAM fluticasone (CUTIVATE) 0.05 % cream      Apply 1 application topically as needed. Apply to toe    Apply 1 application topically as needed. Apply to toe  Discontinued Medications   AZITHROMYCIN (ZITHROMAX) 250 MG TABLET    As directed   FAMOTIDINE (PEPCID) 20 MG TABLET    Take 1 tablet (20 mg total) by mouth at bedtime.

## 2012-07-13 ENCOUNTER — Other Ambulatory Visit: Payer: Self-pay | Admitting: Pulmonary Disease

## 2012-07-13 DIAGNOSIS — D649 Anemia, unspecified: Secondary | ICD-10-CM

## 2012-07-13 MED ORDER — VITAMIN D (ERGOCALCIFEROL) 1.25 MG (50000 UNIT) PO CAPS: 50000.0000 [IU] | ORAL_CAPSULE | ORAL | Status: DC

## 2012-07-13 NOTE — Telephone Encounter (Signed)
Pt is aware of rx sent in to the pharmacy for the vit d 50000 once weekly and pt is aware of lab in the computer and pt will come by for these one day next week.

## 2012-08-01 ENCOUNTER — Telehealth: Payer: Self-pay | Admitting: Pulmonary Disease

## 2012-08-01 NOTE — Telephone Encounter (Signed)
Per SN---ok to call in zpak #1  Take as directed, get mucinex 600 mg   2 po bid, increase fluids, and use nasal saline spray.  thanks

## 2012-08-01 NOTE — Telephone Encounter (Signed)
I spoke with pt and she c/o bilateral ear pain, chest congestion, nasal congestion, PND, dry cough x couple days. No f/c/s/n/v. She is taking tylenol. She is requesting to have something called in for her. Please advise SN thanks Last OV 07/06/12 Allergies  Allergen Reactions  . Codeine     REACTION: nausea  . Diltiazem Hcl     REACTION: pt states it made her dizzy...  . Sulfonamide Derivatives     REACTION: red/splotchy hives  . Tramadol Itching

## 2012-08-06 NOTE — Telephone Encounter (Signed)
LMTCB x 1 for the pt. Does the pt still need?

## 2012-08-07 NOTE — Telephone Encounter (Signed)
Called, spoke with pt's husband.  Was advised pt is not at home right now.  He will ask her to call office back.

## 2012-08-08 ENCOUNTER — Telehealth: Payer: Self-pay | Admitting: Pulmonary Disease

## 2012-08-08 ENCOUNTER — Other Ambulatory Visit: Payer: Self-pay | Admitting: Pulmonary Disease

## 2012-08-08 MED ORDER — AZITHROMYCIN 250 MG PO TABS: ORAL_TABLET | ORAL | Status: DC

## 2012-08-08 NOTE — Telephone Encounter (Signed)
I spoke with pt and is aware of SN recs. I have sent in ABX to the pharmacy as pt is still sick. Nothing further was needed

## 2012-08-08 NOTE — Telephone Encounter (Signed)
Pt returned call.  She is still sick.  Coughing up green mucus and running fever.  [name]

## 2012-08-08 NOTE — Telephone Encounter (Signed)
Made in error.  Message already in epic. Leanora Ivanoff

## 2012-08-16 ENCOUNTER — Telehealth: Payer: Self-pay | Admitting: Pulmonary Disease

## 2012-08-16 NOTE — Telephone Encounter (Signed)
LMTCB x 1 

## 2012-08-16 NOTE — Telephone Encounter (Signed)
The pt is still congested and coughing after finishing abx. She has been scheduled to see TP on Fri., 08/17/12 @ 2:30.

## 2012-08-17 ENCOUNTER — Encounter: Payer: Self-pay | Admitting: Adult Health

## 2012-08-17 ENCOUNTER — Ambulatory Visit (INDEPENDENT_AMBULATORY_CARE_PROVIDER_SITE_OTHER): Payer: Medicare Other | Admitting: Adult Health

## 2012-08-17 VITALS — BP 132/68 | HR 75 | Temp 97.9°F | Ht 62.5 in | Wt 195.8 lb

## 2012-08-17 DIAGNOSIS — J209 Acute bronchitis, unspecified: Secondary | ICD-10-CM

## 2012-08-17 MED ORDER — PREDNISONE 10 MG PO TABS: ORAL_TABLET | ORAL | Status: DC

## 2012-08-17 MED ORDER — CEFDINIR 300 MG PO CAPS: 300.0000 mg | ORAL_CAPSULE | Freq: Two times a day (BID) | ORAL | Status: DC

## 2012-08-17 MED ORDER — BENZONATATE 200 MG PO CAPS: 200.0000 mg | ORAL_CAPSULE | Freq: Three times a day (TID) | ORAL | Status: DC | PRN

## 2012-08-17 NOTE — Patient Instructions (Addendum)
Omnicef 300mg  Twice daily  For 7 days  Mucinex DM Twice daily  As needed  Cough/congestion  Prednisone taper over next week.  Tessalon Three times a day  As needed  Cough  Please contact office for sooner follow up if symptoms do not improve or worsen or seek emergency care

## 2012-08-17 NOTE — Assessment & Plan Note (Signed)
Flare   Plan  Omnicef 300mg  Twice daily  For 7 days  Mucinex DM Twice daily  As needed  Cough/congestion  Prednisone taper over next week.  Tessalon Three times a day  As needed  Cough  Please contact office for sooner follow up if symptoms do not improve or worsen or seek emergency care

## 2012-08-17 NOTE — Progress Notes (Signed)
Subjective:    Patient ID: [name], female    DOB: 1941/04/07, 72 y.o.   MRN: [id number]  HPI 72 y/o WF here for a follow up visit... she has mult med problems as noted below... Followed for general medical purposes w/ hx HBP, CAD & hx Syncope followed by DrTaylor, Hyperlipidemia, Obesity, Fatty Liver disease, LBP/ FM/ Osteopenia, & Anxiety...  ~  July 05, 2011:  12mo ROV & she is c/o dry cough, & a sl sore throat x1d for which she would like a prescription for MMW; also notes several skin cancers removed by Derm & she has f/u appt soon...    >HBP controlled w/ Lasix20 + K20Bid; BP= 146/92 here now but better at home she says& she is reminded to elim sodium, get on diet & get wt down...     >Cards followed by DrTaylor for nonobstructive CAD, DD, Hx syncope & carotid sinus hypersensitivity per DrTaylor; on ASA325mg /d...    >She has severe hyperlipidemia & has been INTOL to all meds; last tried Zetia but she stopped this too; she notes that the Lipid clinic gave up on her and "they released me" so now she is on diet alone + FishOil "I'll do the best I can", and she notes "It's hereditary per DrTaylor"; see FLP results below>>    >Obesity is a constant battle; weight= 210#, 63" tall, BMI=37-38; we reviewed low carb low fat weight reducing diet & exercise program...    >Ortho> FM, LBP, Osteopenia, etc...    >Anxiety on Xanax prn...  ~  July 18, 2011:  2wk ROV & post hospital visit> Extensive hosp records reviewed> she had a TIA 07/13/11 characterized by sudden left sided HA, blurry vision, right leg weakness, slurring of speech & inability to find words> all signs/ symptoms cleared over a 2-3H period; eval in ER & short admission by [facility] w/ Neuro/ Stroke Team consult revealed the following>>  CXR (clear, NAD) & LABS (all essent wnl x FLP as below)...  CT Brain w/ sm vessel dis, NAD...  MRI/ MRA Brain: neg for acute infarct, mild chr microvasc ischemia, normal variant anatomy of the circle  of willis, no signif intracranial abn...  CDopplers were neg- no signif ICA stenoses...  2DEcho revealed mild LVH, norm wall motion w/o regional wall motion abn, EF=60%, no cardiac source of emboli apparent... DrSethi changed her ASA325/d to PLAVIX 75mg /d & wanted her to have an outpt Holter Monitor to r/o PAF (recall Hx below of syncope in 2007 w/ neg 66mo implanted loop recorder); they also started pt on Lipitor20mg  even though she was supposedly INTOL to all statins+Zetia as noted below;  We will call in 90d supplies of her Plavix & Lipitor, she will continue on the CoQ10 OTC... She has not had any additional or recurrent problems since disch...  ~  October 17, 2011:  20mo ROV & post hospital check> she was re-admitted 2/5 - 07/30/11 by [facility] w/ pain in right groin/ upper leg/ inguinal area that was very confusing & of uncertain etiology but she had difficulty bearing wt/ walking;  Abd Sonar showed s/p GB, prominent CBD, increased liver echo texture, NAD;  CT Abd & Pelvis was neg x FLD, Divertics, absent uterus, scat calcif in Ao/ vessels;  XRay of right hip was neg;  MRI Lumbar Spine showed numerous abnormalities including L4-5sp stenosis, right lat recess & foraminal stenosis, sm right paracentral disc protrusion; post op changes on left L5-S1, L5 foraminal stenosis, mod lumbar facet degen;  She was given PREDNISONE w/ improvement according to the DC Summary...    Discharge note & Med Reconciliation carefully reviewed & she was disch on the same meds BUT SHE IS ADAMANT THAT THEY STOPPED HER Atorvastatin, Calcium, Plavix, & Pantoprazole> she has not been taking any of these meds since discharge!  She was supposed to f/u w/ DrDBrodie but she says DrPyrtle said it was not necessary;  I reviewed the Scan & XRay results w/ Lumbar Sp findings & I rec f/u by Ortho to address these issues & the upper leg/ inguinal pain...    She requests TDAP today- OK...  We have requested her to re-start the Plavix 75mg /d, &  Atorvastatin 20mg /d that were started by DrSethi during the prev hosp for TIA...  We have further requested that she see her Orthopedist regarding the findings on her MRI Lumbar spine & the pain in her right upper leg/ inguinal area...  Finally she is asked to follow up w/ DrDBrodie as planned for her ?fatty liver dis etc...  ~  January 04, 2012:  77mo ROV & [name] says "I'm better" & tells me about caring for her new twin great grandchildren... In reviewing her meds> she has re-started the Plavix but was taking Omep20 since it was free from her insurance company; I indicated that the Omep may inactivate the Plavix & the safest PPI for her is PROTONIX 40mg /d (re-written for pt)... She has NOT restarted the Lipitor & is trying to control Lipids w/ diet alone... She has not yet returned to see her Orthopedic specialist or her Gastroenterologist either...    We reviewed prob list, meds, xrays and labs> see below>>  ~  July 06, 2012:  40mo ROV & [name] indicates that she is doing well- no new complaints or concerns; she credits her love & care for her twin grandchildren for her good health & positive attitude...  We reviewed the following medical problems during today's office visit >>      AR> on Dymista & OTC Antihist prn; she had tube placed right ear by DrCNewman- imroved by her hx...    HBP> on Lasix20 + K20; BP= 126/72 & she denies CP, palpit, dizzy, SOB, edema, etc...    CAD> on Plavix75; she denies angina, palpit, etc; last seen by Cards 2009 w/ cath showing mild non-obstructive dis...    Syncope> she had loop recorder 4/08 to11/09 w/o tachy or brady events & no recurrent symptoms; then she had another syncopal spell within days of recorder explantation; DrTaylor diagnosed w/ carotis sinus hypersensitivity...    CHOL> on diet alone; intol to all meds, prev followed in [facility]id Clinic until released; FLP on diet alone showed TChol 201, TG 89, HDL 39, LDL 130 (Improved)...    Obese> weight= 199# which is  down 3# in last 40mo; we reviewed diet, exercise, wt reduction strategies...    GI- GERD, Divertics> on Protonix40; she denies abd pain, n/v, d/c, blood seen...    Hx FLD> LFTs now wnl & improved from 2012; Abd Sonar 2/13 w/ fatty infiltration, s/p GB, some biliary dilatation; CT Abd 2/13 w/ fatty liver, prominent CBD, divertics, s/p GB & hyst, atherosclerotic calcif & multilevel degen changes...    Ortho- LBP, FM> s/p LLam in 1989 by DrBotero; she notes she is able to lift & carry her grandchildren & doing satis...     Osteopenia> on Ca+, MVI, VitD- but VitD level still low at 21 & we will Rx w. VitD 50K weekly; last  BMD 6/11 was improved...    Hx TIA> on Plavix75; she denies cerebral ischemic symptoms...    Anxiety> on Alpraz0.5 prn; she is less nervous & resting better since caring for her twin grandchildren...    PANCYTOPENIA> labs 1/14 showed Hg=11.7, MCV=121, WBC=2.9, Plat=142K; last labs 2/13 were all wnl (MCV=102); she is to ret for Anemia eval=> pending.  We reviewed prob list, meds, xrays and labs> see below for updates >> she had the 2013 Flu vaccine 10/13... LABS 1/14:  FLP- not at goals on diet alone (intol all meds);  Chems- ok w/ BS=109 A1c=5.1;  CBC- mild pancytopenia w/ MCV=121;  TSH=2.63;  VitD=21...     08/17/2012 Acute OV  Complains of prod cough with yellow/clear mucus, wheezing, increased SOB, tightness in chest x4 weeks - finished zpak 4days ago.  No hemotpysis, chest pain , edema or fever.  Mucinex not helping.    Problem List:    ALLERGIC RHINITIS - she states that saline nasal spray dries her eyes out & wants diff inhaler> rec OMNARIS 2sp Qhs... ~  1/14> on Dymista & OTC Antihist prn; she had tube placed right ear by DrCNewman- imroved by her hx...   HYPERTENSION (ICD-401.9) - off Norvasc & Diltiazem, on LASIX 20mg /d, K49mEqBid... ~  8/12: BP=136/92 =>130/80 after rest today and she denies HA, fatigue, visual changes, CP, dyspnea, edema, etc; she notes occas  palpitations...  ~  1/13: BP= 146/92 but she notes better at home & she is reminded to elim sodium, get on diet & get weight down... ~  1/13 post-hosp: BP= 132/84 & she feels back to baseline... ~  4/13: BP= 122/80 & she is feeling better she says... ~  7/13: BP= 124/82 & she is stable... ~  1/14: on Lasix20 + K20; BP= 126/72 & she denies CP, palpit, dizzy, SOB, edema, etc...  CORONARY ARTERY DISEASE (ICD-414.00) - hx non-obstructive CAD followed by DrTaylor Antony Haste... she denies CP, palpit, ch in SOB, edema, etc... ~  cath 1996 showed 20-30% lesions in the Circ & RCA, EF=60%... ~  NuclearStressTest 10/07 showed no scar or ischemia, ?EF=37%?... ~  2DEcho 10/07 showed no regional wall motion changes, EF=60-65%... valves OK, some DD noted. ~  repeat cath 12/09 showed similar findings w/ nonobstructive plaque, EF= 65%... ~  1/13:  2DEcho showed mild LVH, norm wall motion w/o regional wall motion abn, EF=60%, no cardiac source of emboli apparent...  Hx of SYNCOPE (ICD-780.2) - hosp in 2007 for syncope and no etiology discovered... implanted loop recorder placed for 18 months (4/08 to11/09) w/o events, then 2 spells within a few days after the recorder was removed!!!... rehosp 4/08 w/ neg studies at that time too... repeat eval 11/09 & nothing found... Dx w/ Carotid Sinus Hypersensitivity by DrTaylor... ~  CDopplers 10/07 showed mild plaque, 0-39% bilat ICA... ~  neg TiltTable study 4/08... ~  6/10: notes occas palpit, dizzy- uses Meclizine Prn... saw DrTaylor 7/10- rec diet + exerc. ~  No further syncope or near-syncope by her report> she notes dizziness if she tilts her head back "it's cardio-depressor dizziness" she says- no syncope... ~  CDopplers 1/13 showed neg- no signif ICA stenoses...  HYPERLIPIDEMIA (ICD-272.4) - she has proven INTOL to all meds & was "released" from the [facility]> we discussed diet + exercise again... ~  FLP 3/09 showed TChol 139, TG 74, HDL 41, LDL 83... continue med +  diet therapy...  ~  FLP 11/09 showed TChol 99, TG 74, HDL 30, LDL 54 ~  FLP 6/10 on Simva80 showed TChol 141, TG 90, HDL 34, LDL 89... note: LFT's sl elevated... ~  FLP 12/10 off Simva showed TChol 279, TG 178, HDL 33, LDL 208... rec> refer to Lipid Clinic (she never went). ~  FLP 6/11 back on Simva80 showed TChol 154, TG 107, HDL 37, LDL 96... needs LC eval to ch med. ~  FLP 12/11 off meds showed TChol 270, TG 145, HDL 39, LDL 213... rec to return to [facility] for on-going care. ~  Lipid Clinic tried Crestor 5mg  but she states INTOL & "they released me" according to the pt... ~  8/12:  Try Zetia 10mg /d along w/ better diet, exercise, wt reduction => she states INTOL to Zetia as well... ~  1/13: FLP on diet alone showed TChol 234, TG 147, HDL 37, LDL 163... She will continue to work on diet & exercise... ~  1/13 post hosp:  DrSethi started pt on LIPITOR 20mg /d + CoQ10 100mg /d after her TIA "so far so good" ~  4/13:  Pt stopped the Atorva20 after her 2/13 hosp for right leg/inguinal area pain; she swears she was told to stop it by the doctors but all notes indicate she was getting it in the hosp & discharged on it as well;  She is asked to restart it now... ~  7/13:  She never restarted the Lipitor & prefers diet alone... ~  FLP 1/14 on diet alone showed TChol 201, TG 89, HDL 39, LDL 130   OBESITY (ICD-278.00) - We discussed diet + exercise program... she states that DrOz says you can gain weight while hardly eating any food & she thinks she isn't eating enough food...  ~  weight 12/09 = 212# ~  weight 6/10 = 219# ~  weight 12/10 = 217# ~  weight 6/11 = 215# ~  weight 12/11 = 216#.Marland Kitchen we discussed diet + exercise. ~  Weight 8/12 = 213# ~  Weight 1/13 = 210# ~  Weight 4/13 & 7/13 = 202# ~  Weight 1/14 = 199#  DIVERTICULOSIS OF COLON (ICD-562.10) - she needs f/u eval DrDBrodie- ?when was last colonoscopy? ~  CT Abd 9/10  done for Abd pain- showed divertics & hep steatosis, retained stool & DJD sp... ~   CT Abd&Pelvis 2/13 showed smHH, diffuse low attenuation of liver suggests fatty infiltration, diverticulosis, scattered atherosclerotic calcif in Ao & branches, multilevel degen changes in the spine...  FATTY LIVER DISEASE (ICD-571.8) - hx elevated LFT's x yrs and eval DrDBrodie in past w/ rec to diet/ exerc/ weight reduction... ~  labs 6/10 showed SGOT= 89, SGPT= 65 ~  labs 12/10 showed SGOT= 65, SGPT= 54 ~  labs 6/11 showed SGOT= 119, SGPT= 76... needs re-eval by GI ~  labs 12/11 showed SGOT= 53, SGPT= 44 ~  Labs 1/13 showed SGOT= 53, SGPT= 41 ~  Labs 2/13 showed SGOT= 39, SGPT= 25 ~  Labs 1/14 showed SOT= 32, SGPT= 28  LOW BACK PAIN, CHRONIC (ICD-724.2) - s/p lumbar surg in 1989 by DrBotero... ~  4/13:  She denies back pain, but has right leg pain in the upper leg/ inguinal area & had difficulty walking & wt bearing... MRI Lumbar Spine 2/13 revealed   FIBROMYALGIA (ICD-729.1)  OSTEOPENIA (ICD-733.90) - ?prev BMD from GYN... on Calcium, MVI, VitD... ~  BMD here 3/09 showed TScores -0.7 in Spine, and -1.6 in left FemNeck ~  6/11:  f/u BMD showed TScores -0.7 in Spine, and -1.1 in right FemNeck  DIZZINESS (ICD-780.4) -  she takes MECLIZINE 25mg  Bid... ~  s/p ENT eval by Gayla Medicus 2/08 felt to be benign positional vertigo, Rx w/ meclizine...  ~  she cancelled a planned neuro evaluation 4/08...  TIA >> SEE ABOVE in-patient eval 1/13 by [facility] & Stroke team... ASA stopped & pt placed on PLAVIX 75mg /d... ~  CDopplers 1/13 showed tortuous carotids w/ heterogen plaque in bulbs, no signif stenosis, vertebrals antegrade... ~  4/13:  Pt reports that she was told during her 2/13 Hosp to stop the Plavix, but she was given this med daily while in the [facility] & it is on the disch med list;  She stopped it at discharge & was off it for 6=weeks w/o ill effects;  She is rec to re-start the PLAVIX 75mg /d for stroke prevention... ~  1/14: on Plavix75; she denies cerebral ischemic symptoms...   ANXIETY (ICD-300.00)  - on XANAX 0.5mg Tid prn... ~  8/12:  She is c/o insomnia & the Xanax doesn't help she said; rec trial Restoril 30mg Qhs prn...  SKIN CANCER - s/o Moh's surg for BCCa left cheek...  HEALTH MAINT--- GYN= prev DrFore and needs new Gyn now... [facility]... had neg FlexSig 1991 DrBrodie and needs routine colonoscopy scheduled... states she uses olive oil on her dry skin per DrOz... had Pneumovax 2008, & she gets the yearly seasonal Flu vaccine...   Past Surgical History  Procedure Laterality Date  . Tonsillectomy and adenoidectomy    . Total abdominal hysterectomy w/ bilateral salpingoophorectomy    . Cholecystectomy    . Back surgery    . Mohs surgery      Outpatient Encounter Prescriptions as of 08/17/2012  Medication Sig Dispense Refill  . ALPRAZolam (XANAX) 0.5 MG tablet TAKE ONE-HALF TO ONE TABLET BY MOUTH THREE TIMES DAILY AS NEEDED FOR  NERVES  (MAX  OF  3  TABLETS  PER  DAY))  90 tablet  4  . Alum & Mag Hydroxide-Simeth (MAGIC MOUTHWASH) SOLN Take 1 teaspoon gargle and swallow four times a day  120 mL  0  . Azelastine-Fluticasone 137-50 MCG/ACT SUSP Place 1-2 sprays into the nose daily.  23 g  5  . fluticasone (CUTIVATE) 0.05 % cream Apply 1 application topically as needed. Apply to toe  30 g  5  . furosemide (LASIX) 20 MG tablet Take 1 tablet (20 mg total) by mouth daily.  90 tablet  3  . pantoprazole (PROTONIX) 40 MG tablet Take 1 tablet (40 mg total) by mouth daily. 30 minutes prior to a meal  90 tablet  3  . potassium chloride SA (K-DUR,KLOR-CON) 20 MEQ tablet Take 1 tablet (20 mEq total) by mouth 2 (two) times daily.  180 tablet  3  . Vitamin D, Ergocalciferol, (DRISDOL) 50000 UNITS CAPS Take 1 capsule (50,000 Units total) by mouth every 7 (seven) days.  12 capsule  3  . [DISCONTINUED] azithromycin (ZITHROMAX) 250 MG tablet Take as directed  6 tablet  0   No facility-administered encounter medications on file as of 08/17/2012.    Allergies  Allergen Reactions  .  Codeine     REACTION: nausea  . Diltiazem Hcl     REACTION: pt states it made her dizzy...  . Sulfonamide Derivatives     REACTION: red/splotchy hives  . Tramadol Itching    Current Medications, Allergies, Past Medical History, Past Surgical History, Family History, and Social History were reviewed in [employer] record.    Review of Systems  See HPI - all other systems neg except as noted...          The patient denies anorexia, fever, weight loss, weight gain, vision loss, decreased hearing, hoarseness, chest pain, syncope, peripheral edema,  headaches, hemoptysis, abdominal pain, melena, hematochezia, severe indigestion/heartburn, hematuria, incontinence, muscle weakness, suspicious skin lesions, transient blindness, difficulty walking, depression, unusual weight change, abnormal bleeding, enlarged lymph nodes, and angioedema.     Objective:   Physical Exam     WD, Obese, 72 y/o WF in NAD... GENERAL:  Alert & oriented; pleasant & cooperative... HEENT:  [place]/AT TMs-wnl, NOSE-pale, THROAT-clear & wnl. NECK:  Supple w/ fairROM; no JVD; normal carotid impulses w/o bruits; no thyromegaly or nodules palpated; no lymphadenopathy. CHEST:  Few rhonchi  without wheezing, rales, or signs of consolidation... HEART:  Regular Rhythm; without murmurs/ rubs/ or gallops heard... ABDOMEN:  Soft & nontender; normal bowel sounds; no organomegaly or masses detected. EXT: without deformities, mild arthritic changes; no varicose veins/ +venous insuffic/ tr edema... NEURO:  , no focal neuro deficits...  ..    Assessment & Plan:

## 2012-08-22 ENCOUNTER — Telehealth: Payer: Self-pay | Admitting: Pulmonary Disease

## 2012-08-22 ENCOUNTER — Other Ambulatory Visit: Payer: Self-pay | Admitting: Pulmonary Disease

## 2012-08-22 MED ORDER — PROMETHAZINE HCL 25 MG RE SUPP: 25.0000 mg | Freq: Four times a day (QID) | RECTAL | Status: DC | PRN

## 2012-08-22 MED ORDER — CLOPIDOGREL BISULFATE 75 MG PO TABS: 75.0000 mg | ORAL_TABLET | Freq: Every day | ORAL | Status: DC

## 2012-08-22 NOTE — Telephone Encounter (Signed)
Spoke with Daughter pt has had vomiting /diarrahea non stop x1 day.unable to keep anything Down requesting something be called in . Allergies  Allergen Reactions  . Codeine     REACTION: nausea  . Diltiazem Hcl     REACTION: pt states it made her dizzy...  . Sulfonamide Derivatives     REACTION: red/splotchy hives  . Tramadol Itching   Dr Kriste Basque please advise  thank you

## 2012-08-22 NOTE — Telephone Encounter (Signed)
Per SN---  Call in phenergan supp  25 mg  #12  1 inserted every 4 hours prn nausea and vomiting, clear liquids, use immodium otc 1 po every 4 hours as needed for watery diarrhea.  Called and lmomtcb for kim to call me back to make her aware.

## 2012-08-23 NOTE — Telephone Encounter (Signed)
Called, spoke with Selena Batten.  Informed her of below per SN. She verbalized understanding of this and stated pt is feeling better today.  She voiced no further questions or concerns at this time.

## 2012-08-27 ENCOUNTER — Other Ambulatory Visit: Payer: Self-pay | Admitting: Pulmonary Disease

## 2012-11-15 ENCOUNTER — Telehealth: Payer: Self-pay | Admitting: Pulmonary Disease

## 2012-11-15 MED ORDER — METHYLPREDNISOLONE 4 MG PO KIT: PACK | ORAL | Status: DC

## 2012-11-15 NOTE — Telephone Encounter (Signed)
Per SN---ok to call in medrol dosepak  #1  Take as directed with no refills.  Called and lmom to make the pt aware that this medication has been sent to her pharmacy.

## 2012-11-15 NOTE — Telephone Encounter (Signed)
Pt is returning triage's call.  Holly D Pryor ° °

## 2012-11-15 NOTE — Telephone Encounter (Signed)
I spoke with pt and she stated she has poison ivy all over her body/ she has used calamine lotion and another cream and it is not working/helping. She is requesting to have something called in for this. Please advise SN thanks Last OV 08/17/12 Pending 12/25/12 Allergies  Allergen Reactions  . Codeine     REACTION: nausea  . Diltiazem Hcl     REACTION: pt states it made her dizzy...  . Sulfonamide Derivatives     REACTION: red/splotchy hives  . Tramadol Itching

## 2012-11-15 NOTE — Telephone Encounter (Signed)
LMTCB

## 2012-12-27 ENCOUNTER — Telehealth: Payer: Self-pay | Admitting: Pulmonary Disease

## 2012-12-27 ENCOUNTER — Other Ambulatory Visit: Payer: Self-pay | Admitting: Pulmonary Disease

## 2012-12-27 NOTE — Telephone Encounter (Signed)
ATC patient, no answer Phone rand several times and automated msg came on that said "please enter your access code" [facility]

## 2012-12-28 MED ORDER — FUROSEMIDE 20 MG PO TABS: 20.0000 mg | ORAL_TABLET | Freq: Every day | ORAL | Status: DC

## 2012-12-28 NOTE — Telephone Encounter (Signed)
Spoke with patient, requesting refill on furosemide Last sent in #90 w 3 refills 10/17/11 to [facility] Patient requesting Rx be sent to [facility] Rx sent, patient aware and nothing further needed at this time

## 2013-01-04 ENCOUNTER — Ambulatory Visit (INDEPENDENT_AMBULATORY_CARE_PROVIDER_SITE_OTHER): Payer: Medicare Other | Admitting: Pulmonary Disease

## 2013-01-04 ENCOUNTER — Encounter: Payer: Self-pay | Admitting: Pulmonary Disease

## 2013-01-04 VITALS — BP 130/74 | HR 69 | Temp 97.5°F | Ht 68.0 in | Wt 195.2 lb

## 2013-01-04 DIAGNOSIS — F411 Generalized anxiety disorder: Secondary | ICD-10-CM

## 2013-01-04 DIAGNOSIS — I10 Essential (primary) hypertension: Secondary | ICD-10-CM

## 2013-01-04 DIAGNOSIS — M899 Disorder of bone, unspecified: Secondary | ICD-10-CM

## 2013-01-04 DIAGNOSIS — R55 Syncope and collapse: Secondary | ICD-10-CM

## 2013-01-04 DIAGNOSIS — M545 Low back pain, unspecified: Secondary | ICD-10-CM

## 2013-01-04 DIAGNOSIS — K5730 Diverticulosis of large intestine without perforation or abscess without bleeding: Secondary | ICD-10-CM

## 2013-01-04 DIAGNOSIS — M79604 Pain in right leg: Secondary | ICD-10-CM

## 2013-01-04 DIAGNOSIS — E559 Vitamin D deficiency, unspecified: Secondary | ICD-10-CM | POA: Insufficient documentation

## 2013-01-04 DIAGNOSIS — M797 Fibromyalgia: Secondary | ICD-10-CM

## 2013-01-04 DIAGNOSIS — K7689 Other specified diseases of liver: Secondary | ICD-10-CM

## 2013-01-04 DIAGNOSIS — D649 Anemia, unspecified: Secondary | ICD-10-CM

## 2013-01-04 DIAGNOSIS — E669 Obesity, unspecified: Secondary | ICD-10-CM

## 2013-01-04 DIAGNOSIS — E785 Hyperlipidemia, unspecified: Secondary | ICD-10-CM

## 2013-01-04 DIAGNOSIS — I2510 Atherosclerotic heart disease of native coronary artery without angina pectoris: Secondary | ICD-10-CM

## 2013-01-04 DIAGNOSIS — IMO0001 Reserved for inherently not codable concepts without codable children: Secondary | ICD-10-CM

## 2013-01-04 MED ORDER — POTASSIUM CHLORIDE CRYS ER 20 MEQ PO TBCR: 20.0000 meq | EXTENDED_RELEASE_TABLET | Freq: Two times a day (BID) | ORAL | Status: DC

## 2013-01-04 MED ORDER — FLUTICASONE PROPIONATE 0.05 % EX CREA: 1.0000 "application " | TOPICAL_CREAM | CUTANEOUS | Status: DC | PRN

## 2013-01-04 MED ORDER — VITAMIN D (ERGOCALCIFEROL) 1.25 MG (50000 UNIT) PO CAPS: 50000.0000 [IU] | ORAL_CAPSULE | ORAL | Status: DC

## 2013-01-04 MED ORDER — FUROSEMIDE 20 MG PO TABS: 20.0000 mg | ORAL_TABLET | Freq: Every day | ORAL | Status: DC

## 2013-01-04 MED ORDER — CLOPIDOGREL BISULFATE 75 MG PO TABS: 75.0000 mg | ORAL_TABLET | Freq: Every day | ORAL | Status: DC

## 2013-01-04 MED ORDER — AZELASTINE-FLUTICASONE 137-50 MCG/ACT NA SUSP: 1.0000 | Freq: Every day | NASAL | Status: DC

## 2013-01-04 MED ORDER — ALPRAZOLAM 0.5 MG PO TABS: ORAL_TABLET | ORAL | Status: DC

## 2013-01-04 NOTE — Progress Notes (Addendum)
Subjective:    Patient ID: [name], female    DOB: 05-26-1941, 72 y.o.   MRN: [id number]  HPI 72 y/o WF here for a follow up visit... she has mult med problems as noted below... Followed for general medical purposes w/ hx HBP, CAD & hx Syncope followed by DrTaylor, Hyperlipidemia, Obesity, Fatty Liver disease, LBP/ FM/ Osteopenia, & Anxiety...  ~  July 05, 2011:  122mo ROV & she is c/o dry cough, & a sl sore throat x1d for which she would like a prescription for MMW; also notes several skin cancers removed by Derm & she has f/u appt soon...    >HBP controlled w/ Lasix20 + K20Bid; BP= 146/92 here now but better at home she says& she is reminded to elim sodium, get on diet & get wt down...     >Cards followed by DrTaylor for nonobstructive CAD, DD, Hx syncope & carotid sinus hypersensitivity per DrTaylor; on ASA325mg /d...    >She has severe hyperlipidemia & has been INTOL to all meds; last tried Zetia but she stopped this too; she notes that the Lipid clinic gave up on her and "they released me" so now she is on diet alone + FishOil "I'll do the best I can", and she notes "It's hereditary per DrTaylor"; see FLP results below>>    >Obesity is a constant battle; weight= 210#, 63" tall, BMI=37-38; we reviewed low carb low fat weight reducing diet & exercise program...    >Ortho> FM, LBP, Osteopenia, etc...    >Anxiety on Xanax prn...  ~  July 18, 2011:  2wk ROV & post hospital visit> Extensive hosp records reviewed> she had a TIA 07/13/11 characterized by sudden left sided HA, blurry vision, right leg weakness, slurring of speech & inability to find words> all signs/ symptoms cleared over a 2-3H period; eval in ER & short admission by [facility] w/ Neuro/ Stroke Team consult revealed the following>>  CXR (clear, NAD) & LABS (all essent wnl x FLP as below)...  CT Brain w/ sm vessel dis, NAD...  MRI/ MRA Brain: neg for acute infarct, mild chr microvasc ischemia, normal variant anatomy of the circle  of willis, no signif intracranial abn...  CDopplers were neg- no signif ICA stenoses...  2DEcho revealed mild LVH, norm wall motion w/o regional wall motion abn, EF=60%, no cardiac source of emboli apparent... DrSethi changed her ASA325/d to PLAVIX 75mg /d & wanted her to have an outpt Holter Monitor to r/o PAF (recall Hx below of syncope in 2007 w/ neg 80mo implanted loop recorder); they also started pt on Lipitor20mg  even though she was supposedly INTOL to all statins+Zetia as noted below;  We will call in 90d supplies of her Plavix & Lipitor, she will continue on the CoQ10 OTC... She has not had any additional or recurrent problems since disch...  ~  October 17, 2011:  22mo ROV & post hospital check> she was re-admitted 2/5 - 07/30/11 by [facility] w/ pain in right groin/ upper leg/ inguinal area that was very confusing & of uncertain etiology but she had difficulty bearing wt/ walking;  Abd Sonar showed s/p GB, prominent CBD, increased liver echo texture, NAD;  CT Abd & Pelvis was neg x FLD, Divertics, absent uterus, scat calcif in Ao/ vessels;  XRay of right hip was neg;  MRI Lumbar Spine showed numerous abnormalities including L4-5sp stenosis, right lat recess & foraminal stenosis, sm right paracentral disc protrusion; post op changes on left L5-S1, L5 foraminal stenosis, mod lumbar facet degen;  She was given PREDNISONE w/ improvement according to the DC Summary...    Discharge note & Med Reconciliation carefully reviewed & she was disch on the same meds BUT SHE IS ADAMANT THAT THEY STOPPED HER Atorvastatin, Calcium, Plavix, & Pantoprazole> she has not been taking any of these meds since discharge!  She was supposed to f/u w/ DrDBrodie but she says DrPyrtle said it was not necessary;  I reviewed the Scan & XRay results w/ Lumbar Sp findings & I rec f/u by Ortho to address these issues & the upper leg/ inguinal pain...    She requests TDAP today- OK...  We have requested her to re-start the Plavix 75mg /d, &  Atorvastatin 20mg /d that were started by DrSethi during the prev hosp for TIA...  We have further requested that she see her Orthopedist regarding the findings on her MRI Lumbar spine & the pain in her right upper leg/ inguinal area...  Finally she is asked to follow up w/ DrDBrodie as planned for her ?fatty liver dis etc...  ~  January 04, 2012:  824mo ROV & [name] says "I'm better" & tells me about caring for her new twin great grandchildren... In reviewing her meds> she has re-started the Plavix but was taking Omep20 since it was free from her insurance company; I indicated that the Omep may inactivate the Plavix & the safest PPI for her is PROTONIX 40mg /d (re-written for pt)... She has NOT restarted the Lipitor & is trying to control Lipids w/ diet alone... She has not yet returned to see her Orthopedic specialist or her Gastroenterologist either...    We reviewed prob list, meds, xrays and labs> see below>>  ~  July 06, 2012:  24mo ROV & [name] indicates that she is doing well- no new complaints or concerns; she credits her love & care for her twin grandchildren for her good health & positive attitude...  We reviewed the following medical problems during today's office visit >>     AR> on Dymista & OTC Antihist prn; she had tube placed right ear by DrCNewman- imroved by her hx...    HBP> on Lasix20 + K20; BP= 126/72 & she denies CP, palpit, dizzy, SOB, edema, etc...    CAD> on Plavix75; she denies angina, palpit, etc; last seen by Cards 2009 w/ cath showing mild non-obstructive dis...    Syncope> she had loop recorder 4/08 to11/09 w/o tachy or brady events & no recurrent symptoms; then she had another syncopal spell within days of recorder explantation; DrTaylor diagnosed w/ carotis sinus hypersensitivity...    CHOL> on diet alone; intol to all meds, prev followed in [facility]id Clinic until released; FLP on diet alone showed TChol 201, TG 89, HDL 39, LDL 130 (Improved)...    Obese> weight= 199# which is  down 3# in last 24mo; we reviewed diet, exercise, wt reduction strategies...    GI- GERD, Divertics> on Protonix40; she denies abd pain, n/v, d/c, blood seen...    Hx FLD> LFTs now wnl & improved from 2012; Abd Sonar 2/13 w/ fatty infiltration, s/p GB, some biliary dilatation; CT Abd 2/13 w/ fatty liver, prominent CBD, divertics, s/p GB & hyst, atherosclerotic calcif & multilevel degen changes...    Ortho- LBP, FM> s/p LLam in 1989 by DrBotero; she notes she is able to lift & carry her grandchildren & doing satis...     Osteopenia> on Ca+, MVI, VitD- but VitD level still low at 21 & we will Rx w. VitD 50K weekly; last BMD  6/11 was improved...    Hx TIA> on Plavix75; she denies cerebral ischemic symptoms...    Anxiety> on Alpraz0.5 prn; she is less nervous & resting better since caring for her twin grandchildren...    PANCYTOPENIA> labs 1/14 showed Hg=11.7, MCV=121, WBC=2.9, Plat=142K; last labs 2/13 were all wnl (MCV=102); she is to ret for Anemia eval=> pending. We reviewed prob list, meds, xrays and labs> see below for updates >> she had the 2013 Flu vaccine 10/13... LABS 1/14:  FLP- not at goals on diet alone (intol all meds);  Chems- ok w/ BS=109 A1c=5.1;  CBC- mild pancytopenia w/ MCV=121;  TSH=2.63;  VitD=21...     ~  January 04, 2013:  43mo ROV & [name] reports a good interval, loves caring for her twin great grandchildren, no new complaints or concerns... NOTE> she never returned for macrocytic Anemia blood studies after last OV...    Her breathing is good & she denies CP, palpit, SOB, edema; BP= 120/78 on herLasix; she denies cerebral ischemic symptoms on her Plavix75...    She notes some VI & LE bruising & we reviewed no salt, elevation, support hose as needed...    She remains on diet alone (intol all meds) for her Lipids; weight= 196# which is down 2# & we reviewed diet, exercise, wt reduction strategies...    GI is well regulated w/ ProtonixBid plus her diet...    She denies any active ortho  issues & just using OTC meds prn...    She never ret to our lab for additional hematology labs after the 1/14 OV=> needs to get these now! We reviewed prob list, meds, xrays and labs> see below for updates >>  LABS 7/14:  CBC- Hg= 10.9, MCV= 123, B12 level= 27=> Rec to start B12 shots immediately & stay on them for life...    Problem List:    ALLERGIC RHINITIS - she states that saline nasal spray dries her eyes out & wants diff inhaler> rec OMNARIS 2sp Qhs... ~  1/14> on Dymista & OTC Antihist prn; she had tube placed right ear by DrCNewman- imroved by her hx...   HYPERTENSION (ICD-401.9) - off Norvasc & Diltiazem, on LASIX 20mg /d, K11mEqBid... ~  8/12: BP=136/92 =>130/80 after rest today and she denies HA, fatigue, visual changes, CP, dyspnea, edema, etc; she notes occas palpitations...  ~  1/13: BP= 146/92 but she notes better at home & she is reminded to elim sodium, get on diet & get weight down... ~  1/13 post-hosp: BP= 132/84 & she feels back to baseline... ~  4/13: BP= 122/80 & she is feeling better she says... ~  7/13: BP= 124/82 & she is stable... ~  1/14: on Lasix20 + K20; BP= 126/72 & she denies CP, palpit, dizzy, SOB, edema, etc... ~  7/14:  On Lasix20 + K20Bid; BP= 120/78 & she is doing well... Her main exercise is "chasing the twins"...  CORONARY ARTERY DISEASE (ICD-414.00) - hx non-obstructive CAD followed by DrTaylor Antony Haste... she denies CP, palpit, ch in SOB, edema, etc... ~  cath 1996 showed 20-30% lesions in the Circ & RCA, EF=60%... ~  NuclearStressTest 10/07 showed no scar or ischemia, ?EF=37%?... ~  2DEcho 10/07 showed no regional wall motion changes, EF=60-65%... valves OK, some DD noted. ~  repeat cath 12/09 showed similar findings w/ nonobstructive plaque, EF= 65%... ~  1/13:  2DEcho showed mild LVH, norm wall motion w/o regional wall motion abn, EF=60%, no cardiac source of emboli apparent...  Hx of SYNCOPE (ICD-780.2) -  hosp in 2007 for syncope and no etiology  discovered... implanted loop recorder placed for 18 months (4/08 to11/09) w/o events, then 2 spells within a few days after the recorder was removed!!!... rehosp 4/08 w/ neg studies at that time too... repeat eval 11/09 & nothing found... Dx w/ Carotid Sinus Hypersensitivity by DrTaylor... ~  CDopplers 10/07 showed mild plaque, 0-39% bilat ICA... ~  neg TiltTable study 4/08... ~  6/10: notes occas palpit, dizzy- uses Meclizine Prn... saw DrTaylor 7/10- rec diet + exerc. ~  No further syncope or near-syncope by her report> she notes dizziness if she tilts her head back "it's cardio-depressor dizziness" she says- no syncope... ~  CDopplers 1/13 showed neg- no signif ICA stenoses...  HYPERLIPIDEMIA (ICD-272.4) - she has proven INTOL to all meds & was "released" from the [facility]> we discussed diet + exercise again... ~  FLP 3/09 showed TChol 139, TG 74, HDL 41, LDL 83... continue med + diet therapy...  ~  FLP 11/09 showed TChol 99, TG 74, HDL 30, LDL 54 ~  FLP 6/10 on Simva80 showed TChol 141, TG 90, HDL 34, LDL 89... note: LFT's sl elevated... ~  FLP 12/10 off Simva showed TChol 279, TG 178, HDL 33, LDL 208... rec> refer to Lipid Clinic (she never went). ~  FLP 6/11 back on Simva80 showed TChol 154, TG 107, HDL 37, LDL 96... needs LC eval to ch med. ~  FLP 12/11 off meds showed TChol 270, TG 145, HDL 39, LDL 213... rec to return to [facility] for on-going care. ~  Lipid Clinic tried Crestor 5mg  but she states INTOL & "they released me" according to the pt... ~  8/12:  Try Zetia 10mg /d along w/ better diet, exercise, wt reduction => she states INTOL to Zetia as well... ~  1/13: FLP on diet alone showed TChol 234, TG 147, HDL 37, LDL 163... She will continue to work on diet & exercise... ~  1/13 post hosp:  DrSethi started pt on LIPITOR 20mg /d + CoQ10 100mg /d after her TIA "so far so good" ~  4/13:  Pt stopped the Atorva20 after her 2/13 hosp for right leg/inguinal area pain; she swears she was told to stop  it by the doctors but all notes indicate she was getting it in the hosp & discharged on it as well;  She is asked to restart it now... ~  7/13:  She never restarted the Lipitor & prefers diet alone... ~  FLP 1/14 on diet alone showed TChol 201, TG 89, HDL 39, LDL 130   OBESITY (ICD-278.00) - We discussed diet + exercise program... she states that DrOz says you can gain weight while hardly eating any food & she thinks she isn't eating enough food...  ~  weight 12/09 = 212# ~  weight 6/10 = 219# ~  weight 12/10 = 217# ~  weight 6/11 = 215# ~  weight 12/11 = 216#.Marland Kitchen we discussed diet + exercise. ~  Weight 8/12 = 213# ~  Weight 1/13 = 210# ~  Weight 4/13 & 7/13 = 202# ~  Weight 1/14 = 199# ~  Weight 7/14 = 196#  DIVERTICULOSIS OF COLON (ICD-562.10) - she needs f/u eval DrDBrodie- ?when was last colonoscopy? ~  CT Abd 9/10  done for Abd pain- showed divertics & hep steatosis, retained stool & DJD sp... ~  CT Abd&Pelvis 2/13 showed smHH, diffuse low attenuation of liver suggests fatty infiltration, diverticulosis, scattered atherosclerotic calcif in Ao & branches, multilevel degen changes in  the spine...  FATTY LIVER DISEASE (ICD-571.8) - hx elevated LFT's x yrs and eval DrDBrodie in past w/ rec to diet/ exerc/ weight reduction... ~  labs 6/10 showed SGOT= 89, SGPT= 65 ~  labs 12/10 showed SGOT= 65, SGPT= 54 ~  labs 6/11 showed SGOT= 119, SGPT= 76... needs re-eval by GI ~  labs 12/11 showed SGOT= 53, SGPT= 44 ~  Labs 1/13 showed SGOT= 53, SGPT= 41 ~  Labs 2/13 showed SGOT= 39, SGPT= 25 ~  Labs 1/14 showed SOT= 32, SGPT= 28  LOW BACK PAIN, CHRONIC (ICD-724.2) - s/p lumbar surg in 1989 by DrBotero... ~  4/13:  She denies back pain, but has right leg pain in the upper leg/ inguinal area & had difficulty walking & wt bearing... MRI Lumbar Spine 2/13 revealed   FIBROMYALGIA (ICD-729.1)  OSTEOPENIA (ICD-733.90) - ?prev BMD from GYN... on Calcium, MVI, VitD... ~  BMD here 3/09 showed TScores  -0.7 in Spine, and -1.6 in left FemNeck ~  6/11:  f/u BMD showed TScores -0.7 in Spine, and -1.1 in right FemNeck  DIZZINESS (ICD-780.4) - she takes MECLIZINE 25mg  Bid... ~  s/p ENT eval by Gayla Medicus 2/08 felt to be benign positional vertigo, Rx w/ meclizine...  ~  she cancelled a planned neuro evaluation 4/08...  TIA >> SEE ABOVE in-patient eval 1/13 by [facility] & Stroke team... ASA stopped & pt placed on PLAVIX 75mg /d... ~  CDopplers 1/13 showed tortuous carotids w/ heterogen plaque in bulbs, no signif stenosis, vertebrals antegrade... ~  4/13:  Pt reports that she was told during her 2/13 Hosp to stop the Plavix, but she was given this med daily while in the [facility] & it is on the disch med list;  She stopped it at discharge & was off it for 6=weeks w/o ill effects;  She is rec to re-start the PLAVIX 75mg /d for stroke prevention... ~  1/14: on Plavix75; she denies cerebral ischemic symptoms...   ANXIETY (ICD-300.00) - on XANAX 0.5mg Tid prn... ~  8/12:  She is c/o insomnia & the Xanax doesn't help she said; rec trial Restoril 30mg Qhs prn...  SKIN CANCER - s/o Moh's surg for BCCa left cheek...  PANCYTOPENIA >>  VITAMIN B12 DEFICIENCY w/ PA >>  ~  labs 1/14 showed Hg=11.7, MCV=121, WBC=2.9, Plat=142K; last labs 2/13 were all wnl (MCV=102); she is to ret for Anemia eval=> pending (she never returned) ~  7/14: pt notes some incr bruising & needs to get the f/u Hematology labs=> Hg=10.9, MCV=123, Plat=121K, B12 level =27; she is to start B12 shots & stay on these for life...  HEALTH MAINT--- GYN= prev DrFore and needs new Gyn now... [facility]... had neg FlexSig 1991 DrBrodie and needs routine colonoscopy scheduled... states she uses olive oil on her dry skin per DrOz... had Pneumovax 2008, & she gets the yearly seasonal Flu vaccine...   Past Surgical History  Procedure Laterality Date  . Tonsillectomy and adenoidectomy    . Total abdominal hysterectomy w/ bilateral salpingoophorectomy     . Cholecystectomy    . Back surgery    . Mohs surgery      Outpatient Encounter Prescriptions as of 01/04/2013  Medication Sig Dispense Refill  . ALPRAZolam (XANAX) 0.5 MG tablet TAKE ONE-HALF TO ONE TABLET BY MOUTH THREE TIMES DAILY AS NEEDED FOR  NERVES  (MAX  OF  3  TABLETS  PER  DAY))  90 tablet  4  . Alum & Mag Hydroxide-Simeth (MAGIC MOUTHWASH) SOLN Take 1 teaspoon  gargle and swallow four times a day  120 mL  0  . Azelastine-Fluticasone 137-50 MCG/ACT SUSP Place 1-2 sprays into the nose daily.  23 g  5  . benzonatate (TESSALON) 200 MG capsule Take 1 capsule (200 mg total) by mouth 3 (three) times daily as needed for cough.  30 capsule  1  . clopidogrel (PLAVIX) 75 MG tablet Take 1 tablet (75 mg total) by mouth daily.  90 tablet  3  . fluticasone (CUTIVATE) 0.05 % cream Apply 1 application topically as needed. Apply to toe  30 g  5  . furosemide (LASIX) 20 MG tablet Take 1 tablet (20 mg total) by mouth daily.  90 tablet  3  . potassium chloride SA (K-DUR,KLOR-CON) 20 MEQ tablet Take 1 tablet (20 mEq total) by mouth 2 (two) times daily.  180 tablet  3  . Vitamin D, Ergocalciferol, (DRISDOL) 50000 UNITS CAPS Take 1 capsule (50,000 Units total) by mouth every 7 (seven) days.  12 capsule  3  . [DISCONTINUED] cefdinir (OMNICEF) 300 MG capsule Take 1 capsule (300 mg total) by mouth 2 (two) times daily.  14 capsule  0  . [DISCONTINUED] furosemide (LASIX) 20 MG tablet TAKE ONE TABLET BY MOUTH EVERY DAY  30 tablet  1  . [DISCONTINUED] methylPREDNISolone (MEDROL, PAK,) 4 MG tablet follow package directions  21 tablet  0  . [DISCONTINUED] pantoprazole (PROTONIX) 40 MG tablet Take 1 tablet (40 mg total) by mouth daily. 30 minutes prior to a meal  90 tablet  3  . [DISCONTINUED] predniSONE (DELTASONE) 10 MG tablet 4 tabs for 2 days, then 3 tabs for 2 days, 2 tabs for 2 days, then 1 tab for 2 days, then stop  20 tablet  0  . [DISCONTINUED] promethazine (PHENERGAN) 25 MG suppository Place 1 suppository  (25 mg total) rectally every 6 (six) hours as needed for nausea.  12 each  0   No facility-administered encounter medications on file as of 01/04/2013.    Allergies  Allergen Reactions  . Codeine     REACTION: nausea  . Diltiazem Hcl     REACTION: pt states it made her dizzy...  . Sulfonamide Derivatives     REACTION: red/splotchy hives  . Tramadol Itching    Current Medications, Allergies, Past Medical History, Past Surgical History, Family History, and Social History were reviewed in [employer] record.    Review of Systems       See HPI - all other systems neg except as noted...       The patient complains of dyspnea on exertion.  The patient denies anorexia, fever, weight loss, weight gain, vision loss, decreased hearing, hoarseness, chest pain, syncope, peripheral edema, prolonged cough, headaches, hemoptysis, abdominal pain, melena, hematochezia, severe indigestion/heartburn, hematuria, incontinence, muscle weakness, suspicious skin lesions, transient blindness, difficulty walking, depression, unusual weight change, abnormal bleeding, enlarged lymph nodes, and angioedema.     Objective:   Physical Exam     WD, Obese, 72 y/o WF in NAD... GENERAL:  Alert & oriented; pleasant & cooperative... HEENT:  [place]/AT, EOM-wnl,  PERRLA, EACs- wax, TMs-wnl, NOSE-pale, THROAT-clear & wnl. NECK:  Supple w/ fairROM; no JVD; normal carotid impulses w/o bruits; no thyromegaly or nodules palpated; no lymphadenopathy. CHEST:  Clear BS bilaterally without wheezing, rales, or signs of consolidation... HEART:  Regular Rhythm; without murmurs/ rubs/ or gallops heard... ABDOMEN:  Soft & nontender; normal bowel sounds; no organomegaly or masses detected. EXT: without deformities, mild arthritic changes; no  varicose veins/ +venous insuffic/ tr edema/ min bruising/ no petechiae... NEURO:  CN's intact, no focal neuro deficits... DERM:  scar on left cheek, no lesions  noted...  RADIOLOGY DATA:  Reviewed in the EPIC EMR & discussed w/ the patient...  LABORATORY DATA:  Reviewed in the EPIC EMR & discussed w/ the patient...   Assessment & Plan:    HBP>  Control is good on LASIX;  diet w/ weight reduction stressed to pt or we may need to add meds.  CAD>  Followed by DrTaylor for Cards now on PLAVIX; denies angina etc but she is too sedentary & needs incr exercise etc...  Hx Syncope>  Extensive eval by Cards, DrTaylor- dx w/ carotid sinus hypersensitivity; no recurrent episodes she says; she uses Meclizine prn dizziness...  HYPERLIPID>  A signif problem for her but INTOL to all statins and the Lipid Clinic has "released her" she says; she was also intol to Zetia & she has decided to forgo med rx & concentrate on diet alone>> then she had the TIA & DrSethi insisted on ATORVASTATIN20mg  but she eventually stopped this too;  Now on diet alone & numbers are improved...  OBESITY>  We reviewed the needed diet, exercise & wt reduction programs...  GI> Divertics>  She needs f/u DrDBrodie & consideration of f/u colonoscopy...  Fatty Liver Disease>  Hx elev LFTs and CT Abd 9/10 & 2/13 w/ hepatic steatosis; we discussed the need for weight reduction & she will f/u w/ GI...  ORTHO> LBP, FM, Osteopenia>  Prev lumbar surg from DrBotero, she uses OTC analgesics, calcium, MVI... Due to right leg pain she will need Ortho eval & she will call DrGraves for this purpose...  Anxiety>  On Xanax as needed...  PANCYTOPENIA, Vit B12 Defic/ PA> Labs 1/14 w/ new pancytopenia and MCV=121; she was to ret for additional labs but never did- Labs 7/14 show B12=27 c/w severe PA & she will start B12 shots immediately...   Patient's Medications  New Prescriptions   No medications on file  Previous Medications   ALUM & MAG HYDROXIDE-SIMETH (MAGIC MOUTHWASH) SOLN    Take 1 teaspoon gargle and swallow four times a day   BENZONATATE (TESSALON) 200 MG CAPSULE    Take 1 capsule (200 mg  total) by mouth 3 (three) times daily as needed for cough.  Modified Medications   Modified Medication Previous Medication   ALPRAZOLAM (XANAX) 0.5 MG TABLET ALPRAZolam (XANAX) 0.5 MG tablet      TAKE ONE-HALF TO ONE TABLET BY MOUTH THREE TIMES DAILY AS NEEDED FOR  NERVES  (MAX  OF  3  TABLETS  PER  DAY))    TAKE ONE-HALF TO ONE TABLET BY MOUTH THREE TIMES DAILY AS NEEDED FOR  NERVES  (MAX  OF  3  TABLETS  PER  DAY))   AZELASTINE-FLUTICASONE 137-50 MCG/ACT SUSP Azelastine-Fluticasone 137-50 MCG/ACT SUSP      Place 1-2 sprays into the nose daily.    Place 1-2 sprays into the nose daily.   CLOPIDOGREL (PLAVIX) 75 MG TABLET clopidogrel (PLAVIX) 75 MG tablet      Take 1 tablet (75 mg total) by mouth daily.    Take 1 tablet (75 mg total) by mouth daily.   FLUTICASONE (CUTIVATE) 0.05 % CREAM fluticasone (CUTIVATE) 0.05 % cream      Apply 1 application topically as needed. Apply to toe    Apply 1 application topically as needed. Apply to toe   FUROSEMIDE (LASIX) 20 MG TABLET furosemide (LASIX)  20 MG tablet      Take 1 tablet (20 mg total) by mouth daily.    Take 1 tablet (20 mg total) by mouth daily.   POTASSIUM CHLORIDE SA (K-DUR,KLOR-CON) 20 MEQ TABLET potassium chloride SA (K-DUR,KLOR-CON) 20 MEQ tablet      Take 1 tablet (20 mEq total) by mouth 2 (two) times daily.    Take 1 tablet (20 mEq total) by mouth 2 (two) times daily.   VITAMIN D, ERGOCALCIFEROL, (DRISDOL) 50000 UNITS CAPS Vitamin D, Ergocalciferol, (DRISDOL) 50000 UNITS CAPS      Take 1 capsule (50,000 Units total) by mouth every 7 (seven) days.    Take 1 capsule (50,000 Units total) by mouth every 7 (seven) days.  Discontinued Medications   CEFDINIR (OMNICEF) 300 MG CAPSULE    Take 1 capsule (300 mg total) by mouth 2 (two) times daily.   FUROSEMIDE (LASIX) 20 MG TABLET    TAKE ONE TABLET BY MOUTH EVERY DAY   METHYLPREDNISOLONE (MEDROL, PAK,) 4 MG TABLET    follow package directions   PANTOPRAZOLE (PROTONIX) 40 MG TABLET    Take 1 tablet  (40 mg total) by mouth daily. 30 minutes prior to a meal   PREDNISONE (DELTASONE) 10 MG TABLET    4 tabs for 2 days, then 3 tabs for 2 days, 2 tabs for 2 days, then 1 tab for 2 days, then stop   PROMETHAZINE (PHENERGAN) 25 MG SUPPOSITORY    Place 1 suppository (25 mg total) rectally every 6 (six) hours as needed for nausea.

## 2013-01-04 NOTE — Patient Instructions (Addendum)
Today we updated your med list in our EPIC system...    Continue your current medications the same...    We refilled your meds per request...  Keep up the good work w/ DIET & EXERCISE...  Call for any questions...  Let's plan a follow up visit in 70mo w/ FASTING blood work at that time.Marland KitchenMarland Kitchen

## 2013-01-11 ENCOUNTER — Ambulatory Visit (INDEPENDENT_AMBULATORY_CARE_PROVIDER_SITE_OTHER): Payer: Medicare Other

## 2013-01-11 DIAGNOSIS — D649 Anemia, unspecified: Secondary | ICD-10-CM

## 2013-01-11 LAB — CBC WITH DIFFERENTIAL/PLATELET
Basophils Absolute: 0 10*3/uL (ref 0.0–0.1)
Eosinophils Absolute: 0.1 10*3/uL (ref 0.0–0.7)
Lymphocytes Relative: 38.1 % (ref 12.0–46.0)
MCHC: 34 g/dL (ref 30.0–36.0)
Neutrophils Relative %: 55.1 % (ref 43.0–77.0)
Platelets: 121 10*3/uL — ABNORMAL LOW (ref 150.0–400.0)
RDW: 17.9 % — ABNORMAL HIGH (ref 11.5–14.6)

## 2013-01-11 LAB — IBC PANEL
Iron: 125 ug/dL (ref 42–145)
Transferrin: 226 mg/dL (ref 212.0–360.0)

## 2013-01-12 LAB — RETICULOCYTES: RBC.: 2.62 MIL/uL — ABNORMAL LOW (ref 3.87–5.11)

## 2013-01-15 LAB — IGG, IGA, IGM
IgA: 60 mg/dL — ABNORMAL LOW (ref 69–380)
IgM, Serum: 192 mg/dL (ref 52–322)

## 2013-01-15 LAB — PROTEIN ELECTROPHORESIS, SERUM, WITH REFLEX
Alpha-2-Globulin: 7.6 % (ref 7.1–11.8)
Beta 2: 2.9 % — ABNORMAL LOW (ref 3.2–6.5)
Beta Globulin: 6.4 % (ref 4.7–7.2)
Gamma Globulin: 12.8 % (ref 11.1–18.8)
Total Protein, Serum Electrophoresis: 6.5 g/dL (ref 6.0–8.3)

## 2013-01-16 ENCOUNTER — Ambulatory Visit (INDEPENDENT_AMBULATORY_CARE_PROVIDER_SITE_OTHER): Payer: Medicare Other

## 2013-01-16 DIAGNOSIS — D649 Anemia, unspecified: Secondary | ICD-10-CM

## 2013-01-17 MED ORDER — CYANOCOBALAMIN 1000 MCG/ML IJ SOLN
1000.0000 ug | Freq: Once | INTRAMUSCULAR | Status: AC
  Administered 2013-01-17: 1000 ug via INTRAMUSCULAR

## 2013-01-31 ENCOUNTER — Ambulatory Visit: Payer: Medicare Other

## 2013-02-01 ENCOUNTER — Ambulatory Visit (INDEPENDENT_AMBULATORY_CARE_PROVIDER_SITE_OTHER): Payer: Medicare Other

## 2013-02-01 DIAGNOSIS — J309 Allergic rhinitis, unspecified: Secondary | ICD-10-CM

## 2013-02-01 DIAGNOSIS — D649 Anemia, unspecified: Secondary | ICD-10-CM

## 2013-02-08 ENCOUNTER — Ambulatory Visit: Payer: Medicare Other

## 2013-02-12 MED ORDER — CYANOCOBALAMIN 1000 MCG/ML IJ SOLN
1000.0000 ug | Freq: Once | INTRAMUSCULAR | Status: AC
  Administered 2013-02-12: 1000 ug via INTRAMUSCULAR

## 2013-02-15 ENCOUNTER — Telehealth: Payer: Self-pay | Admitting: Pulmonary Disease

## 2013-02-15 NOTE — Telephone Encounter (Signed)
Called and spoke with pt and she stated that early this morning she started with RLQ pain and the pain runs up into her shoulder blade.  She stated that it hurts to put pressure on her belly, and she has had some loose stools today with nausea but no vomitting and no fever.  She stated that the pain is worse with movement.  Per SN---she should go to the ER to be evaluated for these symptoms with the long weekend.  Pt voiced her understanding and nothing further is needed.

## 2013-02-15 NOTE — Telephone Encounter (Signed)
Pt returned call. [name]  

## 2013-02-15 NOTE — Telephone Encounter (Signed)
ATC PT NA WCB 

## 2013-02-28 ENCOUNTER — Telehealth: Payer: Self-pay | Admitting: Pulmonary Disease

## 2013-02-28 MED ORDER — AMOXICILLIN-POT CLAVULANATE 875-125 MG PO TABS: 1.0000 | ORAL_TABLET | Freq: Two times a day (BID) | ORAL | Status: DC

## 2013-02-28 NOTE — Telephone Encounter (Signed)
Per SN---  augmentin 875 mg  #14  1 po bid Align once daily otc Nasal saline spray every 1-2 hours mucinex otc 600 mg  1-2 bid

## 2013-02-28 NOTE — Telephone Encounter (Signed)
Called, spoke with pt.  Informed her of below recs per Dr. Kriste Basque.  She is aware abx sent to [facility] and is to call back if symptoms do not improve or worsen and seek emergency care if needed.  She verbalized understanding of all instructions and voiced no further questions or concerns at this time.

## 2013-02-28 NOTE — Telephone Encounter (Signed)
Called, spoke with pt - C/o nasal congestion with green mucus, coughing a little with white to a small amount of green mucus, sinus pressure, HA, chest tightness, PND, and sore throat.  No increased SOB, wheezing, f/c/s.  Symptoms started last night.  Has not taken any meds yet for symptoms - requesting rx to be called in.  Dr. Kriste Basque, pls advise.  Thank you.  Last OV with SN: 01/04/13; asked to f/u in 6 months  [facility]  Allergies verified with pt:  Allergies  Allergen Reactions  . Codeine     REACTION: nausea  . Diltiazem Hcl     REACTION: pt states it made her dizzy...  . Sulfonamide Derivatives     REACTION: red/splotchy hives  . Tramadol Itching

## 2013-04-09 ENCOUNTER — Telehealth: Payer: Self-pay | Admitting: Pulmonary Disease

## 2013-04-09 MED ORDER — LEVOFLOXACIN 500 MG PO TABS: 500.0000 mg | ORAL_TABLET | Freq: Every day | ORAL | Status: DC

## 2013-04-09 NOTE — Telephone Encounter (Signed)
Called, spoke with pt -  C/o prod cough with small amount of white to green mucus, blowing green mucus from nose, sinus pressure, slight HA, and PND.  Symptoms have been going on x1 month off and on and flared up again yesterday.  No increased SOB, wheezing, chest tightness, chest pain, or f/c/s.Marland Kitchen  Pt took mucinex 600 mg yesterday.  Requesting rx.  Dr. Kriste Basque, pls advise.  Thank you.  Last OV with SN: 01/04/13  [facility]  Allergies verified with pt: Allergies  Allergen Reactions  . Codeine     REACTION: nausea  . Diltiazem Hcl     REACTION: pt states it made her dizzy...  . Sulfonamide Derivatives     REACTION: red/splotchy hives  . Tramadol Itching

## 2013-04-09 NOTE — Telephone Encounter (Signed)
Spoke with the pt and notified of recs per SN She verbalized understanding Rx was sent to pharm

## 2013-04-09 NOTE — Telephone Encounter (Signed)
Per SN---  levaquin 500 mg #7  1 daily otc align once daily Nasal saline spray every 1-2 hours as needed.

## 2013-04-18 ENCOUNTER — Ambulatory Visit (INDEPENDENT_AMBULATORY_CARE_PROVIDER_SITE_OTHER): Payer: Medicare Other

## 2013-04-18 DIAGNOSIS — E538 Deficiency of other specified B group vitamins: Secondary | ICD-10-CM

## 2013-04-18 DIAGNOSIS — Z23 Encounter for immunization: Secondary | ICD-10-CM

## 2013-04-18 MED ORDER — CYANOCOBALAMIN 1000 MCG/ML IJ SOLN
1000.0000 ug | Freq: Once | INTRAMUSCULAR | Status: AC
  Administered 2013-04-18: 1000 ug via INTRAMUSCULAR

## 2013-05-08 ENCOUNTER — Telehealth: Payer: Self-pay | Admitting: Pulmonary Disease

## 2013-05-08 NOTE — Telephone Encounter (Signed)
Noted. Will forward this message to Marliss Czar so that SN will be aware of this.

## 2013-07-10 ENCOUNTER — Ambulatory Visit (INDEPENDENT_AMBULATORY_CARE_PROVIDER_SITE_OTHER): Payer: Medicare Other | Admitting: Pulmonary Disease

## 2013-07-10 ENCOUNTER — Other Ambulatory Visit (INDEPENDENT_AMBULATORY_CARE_PROVIDER_SITE_OTHER): Payer: Medicare Other

## 2013-07-10 ENCOUNTER — Encounter: Payer: Self-pay | Admitting: Pulmonary Disease

## 2013-07-10 VITALS — BP 118/70 | HR 74 | Temp 97.6°F | Ht 68.0 in | Wt 188.2 lb

## 2013-07-10 DIAGNOSIS — K5730 Diverticulosis of large intestine without perforation or abscess without bleeding: Secondary | ICD-10-CM

## 2013-07-10 DIAGNOSIS — E785 Hyperlipidemia, unspecified: Secondary | ICD-10-CM

## 2013-07-10 DIAGNOSIS — M949 Disorder of cartilage, unspecified: Secondary | ICD-10-CM

## 2013-07-10 DIAGNOSIS — M797 Fibromyalgia: Secondary | ICD-10-CM

## 2013-07-10 DIAGNOSIS — D649 Anemia, unspecified: Secondary | ICD-10-CM | POA: Insufficient documentation

## 2013-07-10 DIAGNOSIS — R55 Syncope and collapse: Secondary | ICD-10-CM

## 2013-07-10 DIAGNOSIS — I10 Essential (primary) hypertension: Secondary | ICD-10-CM

## 2013-07-10 DIAGNOSIS — E559 Vitamin D deficiency, unspecified: Secondary | ICD-10-CM

## 2013-07-10 DIAGNOSIS — K7689 Other specified diseases of liver: Secondary | ICD-10-CM

## 2013-07-10 DIAGNOSIS — F411 Generalized anxiety disorder: Secondary | ICD-10-CM

## 2013-07-10 DIAGNOSIS — I2510 Atherosclerotic heart disease of native coronary artery without angina pectoris: Secondary | ICD-10-CM

## 2013-07-10 DIAGNOSIS — M545 Low back pain, unspecified: Secondary | ICD-10-CM

## 2013-07-10 DIAGNOSIS — D518 Other vitamin B12 deficiency anemias: Secondary | ICD-10-CM

## 2013-07-10 DIAGNOSIS — F419 Anxiety disorder, unspecified: Secondary | ICD-10-CM

## 2013-07-10 DIAGNOSIS — D519 Vitamin B12 deficiency anemia, unspecified: Secondary | ICD-10-CM | POA: Insufficient documentation

## 2013-07-10 DIAGNOSIS — M899 Disorder of bone, unspecified: Secondary | ICD-10-CM

## 2013-07-10 DIAGNOSIS — E669 Obesity, unspecified: Secondary | ICD-10-CM

## 2013-07-10 DIAGNOSIS — M79604 Pain in right leg: Secondary | ICD-10-CM

## 2013-07-10 LAB — HEPATIC FUNCTION PANEL
ALT: 24 U/L (ref 0–35)
AST: 30 U/L (ref 0–37)
Albumin: 4.4 g/dL (ref 3.5–5.2)
Alkaline Phosphatase: 154 U/L — ABNORMAL HIGH (ref 39–117)
Bilirubin, Direct: 0.2 mg/dL (ref 0.0–0.3)
Total Bilirubin: 1.2 mg/dL (ref 0.3–1.2)
Total Protein: 7.8 g/dL (ref 6.0–8.3)

## 2013-07-10 LAB — CBC WITH DIFFERENTIAL/PLATELET
Basophils Absolute: 0 10*3/uL (ref 0.0–0.1)
Basophils Relative: 0.3 % (ref 0.0–3.0)
Eosinophils Absolute: 0.1 10*3/uL (ref 0.0–0.7)
Eosinophils Relative: 3 % (ref 0.0–5.0)
HEMATOCRIT: 44.6 % (ref 36.0–46.0)
HEMOGLOBIN: 14.7 g/dL (ref 12.0–15.0)
LYMPHS ABS: 1 10*3/uL (ref 0.7–4.0)
Lymphocytes Relative: 21.2 % (ref 12.0–46.0)
MCHC: 32.9 g/dL (ref 30.0–36.0)
MCV: 87.2 fl (ref 78.0–100.0)
MONOS PCT: 7.1 % (ref 3.0–12.0)
Monocytes Absolute: 0.3 10*3/uL (ref 0.1–1.0)
NEUTROS ABS: 3.3 10*3/uL (ref 1.4–7.7)
Neutrophils Relative %: 68.4 % (ref 43.0–77.0)
Platelets: 170 10*3/uL (ref 150.0–400.0)
RBC: 5.12 Mil/uL — ABNORMAL HIGH (ref 3.87–5.11)
RDW: 16 % — ABNORMAL HIGH (ref 11.5–14.6)
WBC: 4.8 10*3/uL (ref 4.5–10.5)

## 2013-07-10 LAB — BASIC METABOLIC PANEL
BUN: 16 mg/dL (ref 6–23)
CALCIUM: 9.5 mg/dL (ref 8.4–10.5)
CO2: 27 mEq/L (ref 19–32)
CREATININE: 0.9 mg/dL (ref 0.4–1.2)
Chloride: 104 mEq/L (ref 96–112)
GFR: 67.95 mL/min (ref >60.00)
GLUCOSE: 98 mg/dL (ref 70–99)
Potassium: 4.1 mEq/L (ref 3.5–5.1)
SODIUM: 139 meq/L (ref 135–145)

## 2013-07-10 LAB — LIPID PANEL
CHOL/HDL RATIO: 5
CHOLESTEROL: 235 mg/dL — AB (ref 0–200)
HDL: 44.3 mg/dL (ref >39.00)
Triglycerides: 114 mg/dL (ref 0.0–149.0)
VLDL: 22.8 mg/dL (ref 0.0–40.0)

## 2013-07-10 LAB — LDL CHOLESTEROL, DIRECT: LDL DIRECT: 172.9 mg/dL

## 2013-07-10 LAB — TSH: TSH: 3.36 u[IU]/mL (ref 0.35–5.50)

## 2013-07-10 MED ORDER — CLOPIDOGREL BISULFATE 75 MG PO TABS: 75.0000 mg | ORAL_TABLET | Freq: Every day | ORAL | Status: DC

## 2013-07-10 MED ORDER — ALPRAZOLAM 0.5 MG PO TABS: ORAL_TABLET | ORAL | Status: DC

## 2013-07-10 MED ORDER — AZELASTINE-FLUTICASONE 137-50 MCG/ACT NA SUSP: 2.0000 | Freq: Every day | NASAL | Status: DC

## 2013-07-10 MED ORDER — POTASSIUM CHLORIDE CRYS ER 20 MEQ PO TBCR: 20.0000 meq | EXTENDED_RELEASE_TABLET | Freq: Two times a day (BID) | ORAL | Status: DC

## 2013-07-10 MED ORDER — CYANOCOBALAMIN 1000 MCG/ML IJ SOLN
1000.0000 ug | Freq: Once | INTRAMUSCULAR | Status: AC
  Administered 2013-07-10: 1000 ug via INTRAMUSCULAR

## 2013-07-10 MED ORDER — CLOTRIMAZOLE-BETAMETHASONE 1-0.05 % EX CREA
TOPICAL_CREAM | CUTANEOUS | Status: DC
Start: 2013-07-10 — End: 2013-09-04

## 2013-07-10 MED ORDER — FUROSEMIDE 20 MG PO TABS: 20.0000 mg | ORAL_TABLET | Freq: Every day | ORAL | Status: DC

## 2013-07-10 NOTE — Patient Instructions (Signed)
Today we updated your med list in our EPIC system...    Continue your current medications the same...  We changed the Cutivate cream to Lotrisone (generic) to save $$$...  We will set you up for B12 shots EVERY MONTH in our office...  Today we did your follow up FASTING blood work...    We will contact you w/ the results when available...   Call for any questions...  Let's plan a follow up visit in 6mo, sooner if needed for problems.Marland KitchenMarland Kitchen

## 2013-07-10 NOTE — Progress Notes (Signed)
Subjective:    Patient ID: [name], female    DOB: July 30, 1940, 73 y.o.   MRN: [id number]  HPI 73 y/o WF here for a follow up visit... she has mult med problems as noted below... Followed for general medical purposes w/ hx HBP, CAD & hx Syncope followed by DrTaylor, Hyperlipidemia, Obesity, Fatty Liver disease, LBP/ FM/ Osteopenia, & Anxiety...  ~  July 18, 2011:  2wk ROV & post hospital visit> Extensive hosp records reviewed> she had a TIA 07/13/11 characterized by sudden left sided HA, blurry vision, right leg weakness, slurring of speech & inability to find words> all signs/ symptoms cleared over a 2-3H period; eval in ER & short admission by [facility] w/ Neuro/ Stroke Team consult revealed the following>>  CXR (clear, NAD) & LABS (all essent wnl x FLP as below)...  CT Brain w/ sm vessel dis, NAD...  MRI/ MRA Brain: neg for acute infarct, mild chr microvasc ischemia, normal variant anatomy of the circle of willis, no signif intracranial abn...  CDopplers were neg- no signif ICA stenoses...  2DEcho revealed mild LVH, norm wall motion w/o regional wall motion abn, EF=60%, no cardiac source of emboli apparent... DrSethi changed her ASA325/d to PLAVIX $RemoveB'75mg'jLNkbCtC$ /d & wanted her to have an outpt Holter Monitor to r/o PAF (recall Hx below of syncope in 2007 w/ neg 81mo implanted loop recorder); they also started pt on Lipitor$RemoveBefo'20mg'shrhKpcATVl$  even though she was supposedly INTOL to all statins+Zetia as noted below;  We will call in 90d supplies of her Plavix & Lipitor, she will continue on the CoQ10 OTC... She has not had any additional or recurrent problems since disch...  ~  October 17, 2011:  30mo ROV & post hospital check> she was re-admitted 2/5 - 07/30/11 by [facility] w/ pain in right groin/ upper leg/ inguinal area that was very confusing & of uncertain etiology but she had difficulty bearing wt/ walking;  Abd Sonar showed s/p GB, prominent CBD, increased liver echo texture, NAD;  CT Abd & Pelvis was neg x FLD,  Divertics, absent uterus, scat calcif in Ao/ vessels;  XRay of right hip was neg;  MRI Lumbar Spine showed numerous abnormalities including L4-5sp stenosis, right lat recess & foraminal stenosis, sm right paracentral disc protrusion; post op changes on left L5-S1, L5 foraminal stenosis, mod lumbar facet degen;  She was given PREDNISONE w/ improvement according to the DC Summary...    Discharge note & Med Reconciliation carefully reviewed & she was disch on the same meds BUT SHE IS ADAMANT THAT THEY STOPPED HER Atorvastatin, Calcium, Plavix, & Pantoprazole> she has not been taking any of these meds since discharge!  She was supposed to f/u w/ DrDBrodie but she says DrPyrtle said it was not necessary;  I reviewed the Scan & XRay results w/ Lumbar Sp findings & I rec f/u by Ortho to address these issues & the upper leg/ inguinal pain...    She requests TDAP today- OK...  We have requested her to re-start the Plavix $RemoveBe'75mg'GoQVRpmFY$ /d, & Atorvastatin $RemoveBefore'20mg'ijzZAiqXUKguZ$ /d that were started by DrSethi during the prev hosp for TIA...  We have further requested that she see her Orthopedist regarding the findings on her MRI Lumbar spine & the pain in her right upper leg/ inguinal area...  Finally she is asked to follow up w/ DrDBrodie as planned for her ?fatty liver dis etc...  ~  January 04, 2012:  55mo ROV & Brinley says "I'm better" & tells me about caring for her new twin  great grandchildren... In reviewing her meds> she has re-started the Plavix but was taking Omep20 since it was free from her insurance company; I indicated that the Omep may inactivate the Plavix & the safest PPI for her is PROTONIX 45m/d (re-written for pt)... She has NOT restarted the Lipitor & is trying to control Lipids w/ diet alone... She has not yet returned to see her Orthopedic specialist or her Gastroenterologist either...    We reviewed prob list, meds, xrays and labs> see below>>  ~  July 06, 2012:  6522moOV & Niobe indicates that she is doing well- no new  complaints or concerns; she credits her love & care for her twin grandchildren for her good health & positive attitude...  We reviewed the following medical problems during today's office visit >>     AR> on Dymista & OTC Antihist prn; she had tube placed right ear by DrCNewman- imroved by her hx...    HBP> on Lasix20 + K20; BP= 126/72 & she denies CP, palpit, dizzy, SOB, edema, etc...    CAD> on Plavix75; she denies angina, palpit, etc; last seen by Cards 2009 w/ cath showing mild non-obstructive dis...    Syncope> she had loop recorder 4/08 to11/09 w/o tachy or brady events & no recurrent symptoms; then she had another syncopal spell within days of recorder explantation; DrTaylor diagnosed w/ carotis sinus hypersensitivity...    CHOL> on diet alone; intol to all meds, prev followed in [facility] until released; FLP on diet alone showed TChol 201, TG 89, HDL 39, LDL 130 (Improved)...    Obese> weight= 199# which is down 3# in last 22m32moe reviewed diet, exercise, wt reduction strategies...    GI- GERD, Divertics> on Protonix40; she denies abd pain, n/v, d/c, blood seen...    Hx FLD> LFTs now wnl & improved from 2012; Abd Sonar 2/13 w/ fatty infiltration, s/p GB, some biliary dilatation; CT Abd 2/13 w/ fatty liver, prominent CBD, divertics, s/p GB & hyst, atherosclerotic calcif & multilevel degen changes...    Ortho- LBP, FM> s/p LLam in 1989 by DrBotero; she notes she is able to lift & carry her grandchildren & doing satis...     Osteopenia> on Ca+, MVI, VitD- but VitD level still low at 21 & we will Rx w. VitD 50K weekly; last BMD 6/11 was improved...    Hx TIA> on Plavix75; she denies cerebral ischemic symptoms...    Anxiety> on Alpraz0.5 prn; she is less nervous & resting better since caring for her twin grandchildren...    PANCYTOPENIA> labs 1/14 showed Hg=11.7, MCV=121, WBC=2.9, Plat=142K; last labs 2/13 were all wnl (MCV=102); she is to ret for Anemia eval=> pending. We reviewed prob list,  meds, xrays and labs> see below for updates >> she had the 2013 Flu vaccine 10/13...  LABS 1/14:  FLP- not at goals on diet alone (intol all meds);  Chems- ok w/ BS=109 A1c=5.1;  CBC- mild pancytopenia w/ MCV=121;  TSH=2.63;  VitD=21...     ~  January 04, 2013:  22mo79mo & Tamsen reports a good interval, loves caring for her twin great grandchildren, no new complaints or concerns... NOTE> she never returned for macrocytic Anemia blood studies after last OV...    Her breathing is good & she denies CP, palpit, SOB, edema; BP= 120/78 on herLasix; she denies cerebral ischemic symptoms on her Plavix75...    She notes some VI & LE bruising & we reviewed no salt, elevation, support hose as needed...Marland KitchenMarland Kitchen  She remains on diet alone (intol all meds) for her Lipids; weight= 196# which is down 2# & we reviewed diet, exercise, wt reduction strategies...    GI is well regulated w/ ProtonixBid plus her diet...    She denies any active ortho issues & just using OTC meds prn...    She never ret to our lab for additional hematology labs after the 1/14 OV=> needs to get these now! We reviewed prob list, meds, xrays and labs> see below for updates >>   LABS 7/14:  CBC- Hg= 10.9, MCV= 123, B12 level= 27=> Rec to start B12 shots immediately & stay on them for life...  ~  July 10, 2013:  32mo ROV & Rida reports that after her last OV she did weekly B12 shots for 6 weeks then stopped, none since then;  Her CBC shows Hg=14.7 & her MCV=87 (improved from 123) & I explained to her that she will need B12 shots 1083mcg/month for life...  She is feeling well, states that her breathing is good, BP= 118/70, and she denies CP/ palpit/ SOB/ edema/ etc...     Her weight is down to 188# (down 7#) but her FLP shows TChol 235, TG 114, HDL 44, LDL 173; she understands that ths is way off & a signif risk factor for her but she refuses meds! We reviewed diet, exercise, wt reduction strategies...     Her Vit D level remains very low at 23 as  shew nevere started the OTC VitD supplements prev recommended; we decided on VitD 50K weekly...  We reviewed prob list, meds, xrays and labs> see below for updates >>   LABS 1/15:  FLP- not at goals on diet alone w/ LDL=173;  Chems- wnl x AlkPhos=154;  CBC- wnl;  TSH=3.36;  VitD=23... REC to get on B12 shots 1041mcg/mo for life, and start VitD 50K weekly by mouth...    Problem List:    ALLERGIC RHINITIS - she states that saline nasal spray dries her eyes out & wants diff inhaler> rec OMNARIS 2sp Qhs... ~  1/14> on Dymista & OTC Antihist prn; she had tube placed right ear by DrCNewman- imroved by her hx...   HYPERTENSION (ICD-401.9) - off Norvasc & Diltiazem, on LASIX $Remove'20mg'DDAQcNI$ /d, K71mEqBid... ~  8/12: BP=136/92 =>130/80 after rest today and she denies HA, fatigue, visual changes, CP, dyspnea, edema, etc; she notes occas palpitations...  ~  1/13: BP= 146/92 but she notes better at home & she is reminded to elim sodium, get on diet & get weight down... ~  1/13 post-hosp: BP= 132/84 & she feels back to baseline... ~  4/13: BP= 122/80 & she is feeling better she says... ~  7/13: BP= 124/82 & she is stable... ~  1/14: on Lasix20 + K20; BP= 126/72 & she denies CP, palpit, dizzy, SOB, edema, etc... ~  7/14:  On Lasix20 + K20Bid; BP= 120/78 & she is doing well... Her main exercise is "chasing the twins"... ~  1/15:  On Lasix20 + K20Bid; BP= 118/70  CORONARY ARTERY DISEASE (ICD-414.00) - hx non-obstructive CAD followed by DrTaylor Harriett Sine... she denies CP, palpit, ch in SOB, edema, etc... ~  cath 1996 showed 20-30% lesions in the Circ & RCA, EF=60%... ~  NuclearStressTest 10/07 showed no scar or ischemia, ?EF=37%?... ~  2DEcho 10/07 showed no regional wall motion changes, EF=60-65%... valves OK, some DD noted. ~  repeat cath 12/09 showed similar findings w/ nonobstructive plaque, EF= 65%... ~  1/13:  2DEcho showed mild LVH,  norm wall motion w/o regional wall motion abn, EF=60%, no cardiac source of emboli  apparent...  Hx of SYNCOPE (ICD-780.2) - hosp in 2007 for syncope and no etiology discovered... implanted loop recorder placed for 18 months (4/08 to11/09) w/o events, then 2 spells within a few days after the recorder was removed!!!... rehosp 4/08 w/ neg studies at that time too... repeat eval 11/09 & nothing found... Dx w/ Carotid Sinus Hypersensitivity by DrTaylor... ~  CDopplers 10/07 showed mild plaque, 0-39% bilat ICA... ~  neg TiltTable study 4/08... ~  6/10: notes occas palpit, dizzy- uses Meclizine Prn... saw DrTaylor 7/10- rec diet + exerc. ~  No further syncope or near-syncope by her report> she notes dizziness if she tilts her head back "it's cardio-depressor dizziness" she says- no syncope... ~  CDopplers 1/13 showed neg- no signif ICA stenoses...  HYPERLIPIDEMIA (ICD-272.4) - she has proven INTOL to all meds & was "[facility]" from the [facility]> we discussed diet + exercise again... ~  [place] 3/09 showed TChol 139, TG 74, HDL 41, LDL 83... continue med + diet therapy...  ~  [place] 11/09 showed TChol 99, TG 74, HDL 30, LDL 54 ~  FLP 6/10 on Simva80 showed TChol 141, TG 90, HDL 34, LDL 89... note: LFT's sl elevated... ~  FLP 12/10 off Simva showed TChol 279, TG 178, HDL 33, LDL 208... rec> refer to [facility] (she never went). ~  [place] 6/11 back on Simva80 showed TChol 154, TG 107, HDL 37, LDL 96... needs LC eval to ch med. ~  [place] 12/11 off meds showed TChol 270, TG 145, HDL 39, LDL 213... rec to [facility] for on-going care. ~  [facility]id Clinic tried Crestor 65m but she states INTOL & "they released me" according to the pt... ~  8/12:  Try Zetia 153md along w/ better diet, exercise, wt reduction => she states INTOL to Zetia as well... ~  1/13: FLP on diet alone showed TChol 234, TG 147, HDL 37, LDL 163... She will continue to work on diet & exercise... ~  1/13 post hosp:  DrSethi started pt on LIPITOR 2035m + CoQ10 100m38mafter her TIA "so far so good" ~  4/13:  Pt stopped the Atorva20  after her 2/13 hosp for right leg/inguinal area pain; she swears she was told to stop it by the doctors but all notes indicate she was getting it in the hosp & discharged on it as well;  She is asked to restart it now... ~  7/13:  She never restarted the Lipitor & prefers diet alone... ~  FLP 1/14 on diet alone showed TChol 201, TG 89, HDL 39, LDL 130  ~  FLP 1/15 on diet alone showed TChol 235, TG 114, HDL 44, LDL 173... SHE REFUSES MEDS, we reviewed diet & exercise...  OBESITY (ICD-278.00) - We discussed diet + exercise program... she states that DrOz says you can gain weight while hardly eating any food & she thinks she isn't eating enough food...  ~  weight 12/09 = 212# ~  weight 6/10 = 219# ~  weight 12/10 = 217# ~  weight 6/11 = 215# ~  weight 12/11 = 216#.. weMarland Kitchendiscussed diet + exercise. ~  Weight 8/12 = 213# ~  Weight 1/13 = 210# ~  Weight 4/13 & 7/13 = 202# ~  Weight 1/14 = 199# ~  Weight 7/14 = 196# ~  Weight 1/15 = 188#  DIVERTICULOSIS OF COLON (ICD-562.10) - she needs f/u eval DrDBrodie- ?  when was last colonoscopy? ~  CT Abd 9/10  done for Abd pain- showed divertics & hep steatosis, retained stool & DJD sp... ~  CT Abd&Pelvis 2/13 showed [place], diffuse low attenuation of liver suggests fatty infiltration, diverticulosis, scattered atherosclerotic calcif in Ao & branches, multilevel degen changes in the spine...  FATTY LIVER DISEASE (ICD-571.8) - hx elevated LFT's x yrs and eval DrDBrodie in past w/ rec to diet/ exerc/ weight reduction... ~  labs 6/10 showed SGOT= 89, SGPT= 65 ~  labs 12/10 showed SGOT= 65, SGPT= 54 ~  labs 6/11 showed SGOT= 119, SGPT= 76... needs re-eval by GI ~  labs 12/11 showed SGOT= 53, SGPT= 44 ~  Labs 1/13 showed SGOT= 53, SGPT= 41 ~  Labs 2/13 showed SGOT= 39, SGPT= 25 ~  Labs 1/14 showed SGOT= 32, SGPT= 28  ~  Labs 1/15 showed GOT/GPT= wnl & AlkPhos=154  LOW BACK PAIN, CHRONIC (ICD-724.2) - s/p lumbar surg in 1989 by DrBotero... ~  4/13:  She denies  back pain, but has right leg pain in the upper leg/ inguinal area & had difficulty walking & wt bearing... MRI Lumbar Spine 2/13 revealed   FIBROMYALGIA (ICD-729.1)  OSTEOPENIA (ICD-733.90) - ?prev BMD from GYN... on Calcium, MVI, VitD... VIT D DEFICIENCY >> ~  BMD here 3/09 showed TScores -0.7 in Spine, and -1.6 in left FemNeck ~  6/11:  f/u BMD showed TScores -0.7 in Spine, and -1.1 in right FemNeck ~  1/15: she states she never started the OTC VitD supplements as prev rec; Labs 1/15 showed VitD level = 23; Rec to start VitD 50K weekly...  DIZZINESS (ICD-780.4) - she takes MECLIZINE 56m Bid... ~  s/p ENT eval by DBarbaraann Faster2/08 felt to be benign positional vertigo, Rx w/ meclizine...  ~  she cancelled a planned neuro evaluation 4/08...  TIA >> [facility]... ASA stopped & pt placed on PLAVIX 7110md... ~  CDopplers 1/13 showed tortuous carotids w/ heterogen plaque in bulbs, no signif stenosis, vertebrals antegrade... ~  4/13:  Pt reports that she was told during her 2/13 Hosp to stop the Plavix, but she [place] this [place] [facility] [place];  She stopped it at discharge & was off it for 6=weeks w/o ill effects;  She is rec to re-start the PLAVIX 752m for stroke prevention... ~  1/14: on Plavix75; she denies cerebral ischemic symptoms...   ANXIETY (ICD-300.00) - on XANAX 0.5mg37m prn... ~  8/12:  She is c/o insomnia & the Xanax doesn't help she said; rec trial Restoril 30mg76mprn...  SKIN CANCER - s/o Moh's surg for BCCa left cheek...  PANCYTOPENIA >>  VITAMIN B12 DEFICIENCY w/ PA >>  ~  labs 1/14 showed Hg=11.7, MCV=121, WBC=2.9, Plat=142K; last labs 2/13 were all wnl (MCV=102); she is to ret for Anemia eval=> pending (she never returned) ~  7/14: pt notes some incr bruising & needs to get the f/u Hematology labs=> Hg=10.9, MCV=123, Plat=121K, B12 level =27; she is to start B12 shots & stay on these for life... ~   1/15: pt took B12 shots weekly for 6 weeks then stopped; Labs 1/15 showed Hg=14.7 & her MCV=87 & I explained to her that she will need B12 shots 1000mcg38mth for life...  HEALTH MAINT--- GYN= prev DrFore and needs new Gyn now... [facility]rodie and needs routine colonoscopy scheduled... states she uses  olive oil on her dry skin per DrOz... had Pneumovax 2008, & she gets the yearly seasonal Flu vaccine...   Past Surgical History  Procedure Laterality Date  . Tonsillectomy and adenoidectomy    . Total abdominal hysterectomy w/ bilateral salpingoophorectomy    . Cholecystectomy    . Back surgery    . Mohs surgery      Outpatient Encounter Prescriptions as of 07/10/2013  Medication Sig  . ALPRAZolam (XANAX) 0.5 MG tablet TAKE ONE-HALF TO ONE TABLET BY MOUTH THREE TIMES DAILY AS NEEDED FOR  NERVES  (MAX  OF  3  TABLETS  PER  DAY))  . Alum & Mag Hydroxide-Simeth (MAGIC MOUTHWASH) SOLN Take 1 teaspoon gargle and swallow four times a day  . amoxicillin-clavulanate (AUGMENTIN) 875-125 MG per tablet Take 1 tablet by mouth 2 (two) times daily.  . Azelastine-Fluticasone 137-50 MCG/ACT SUSP Place 1-2 sprays into the nose daily.  . benzonatate (TESSALON) 200 MG capsule Take 1 capsule (200 mg total) by mouth 3 (three) times daily as needed for cough.  . clopidogrel (PLAVIX) 75 MG tablet Take 1 tablet (75 mg total) by mouth daily.  . fluticasone (CUTIVATE) 0.05 % cream Apply 1 application topically as needed. Apply to toe  . furosemide (LASIX) 20 MG tablet Take 1 tablet (20 mg total) by mouth daily.  Marland Kitchen levofloxacin (LEVAQUIN) 500 MG tablet Take 1 tablet (500 mg total) by mouth daily.  . potassium chloride SA (K-DUR,KLOR-CON) 20 MEQ tablet Take 1 tablet (20 mEq total) by mouth 2 (two) times daily.  . Vitamin D, Ergocalciferol, (DRISDOL) 50000 UNITS CAPS Take 1 capsule (50,000 Units total) by mouth every 7 (seven) days.    Allergies  Allergen Reactions  .  Codeine     REACTION: nausea  . Diltiazem Hcl     REACTION: pt states it made her dizzy...  . Sulfonamide Derivatives     REACTION: red/splotchy hives  . Tramadol Itching    Current Medications, Allergies, Past Medical History, Past Surgical History, Family History, and Social History were reviewed in [employer] record.    Review of Systems       See HPI - all other systems neg except as noted...       The patient complains of dyspnea on exertion.  The patient denies anorexia, fever, weight loss, weight gain, vision loss, decreased hearing, hoarseness, chest pain, syncope, peripheral edema, prolonged cough, headaches, hemoptysis, abdominal pain, melena, hematochezia, severe indigestion/heartburn, hematuria, incontinence, muscle weakness, suspicious skin lesions, transient blindness, difficulty walking, depression, unusual weight change, abnormal bleeding, enlarged lymph nodes, and angioedema.     Objective:   Physical Exam     WD, Obese, 73 y/o WF in NAD... GENERAL:  Alert & oriented; pleasant & cooperative... HEENT:  [place]/AT, EOM-wnl,  PERRLA, EACs- wax, TMs-wnl, NOSE-pale, THROAT-clear & wnl. NECK:  Supple w/ fairROM; no JVD; normal carotid impulses w/o bruits; no thyromegaly or nodules palpated; no lymphadenopathy. CHEST:  Clear BS bilaterally without wheezing, rales, or signs of consolidation... HEART:  Regular Rhythm; without murmurs/ rubs/ or gallops heard... ABDOMEN:  Soft & nontender; normal bowel sounds; no organomegaly or masses detected. EXT: without deformities, mild arthritic changes; no varicose veins/ +venous insuffic/ tr edema/ min bruising/ no petechiae... NEURO:  CN's intact, no focal neuro deficits... DERM:  scar on left cheek, no lesions noted...  RADIOLOGY DATA:  Reviewed in the EPIC EMR & discussed w/ the patient...  LABORATORY DATA:  Reviewed in the EPIC EMR & discussed w/  the patient...   Assessment & Plan:    HBP>  Control is good  on LASIX;  diet w/ weight reduction stressed to pt...  CAD>  Followed by DrTaylor for Cards on PLAVIX; denies angina etc but she is too sedentary & needs incr exercise etc...  Hx Syncope>  Extensive eval by Cards, DrTaylor- dx w/ carotid sinus hypersensitivity; no recurrent episodes she says; she uses Meclizine prn dizziness...  HYPERLIPID>  A signif problem for her but INTOL to all statins and the Lipid Clinic has "released her" she says; she was also intol to Zetia & she has decided to forgo med rx & concentrate on diet alone>> then she had the TIA & DrSethi insisted on ATORVASTATIN48m but she eventually stopped this too;  Now on diet alone & numbers remain deranged but she3 is adamant that she will NOT take meds for this problem...  OBESITY>  We reviewed the needed diet, exercise & wt reduction programs...  GI> Divertics>  She needs f/u DrDBrodie & consideration of f/u colonoscopy...  Fatty Liver Disease>  Hx elev LFTs and CT Abd 9/10 & 2/13 w/ hepatic steatosis; we discussed the need for weight reduction...  ORTHO> LBP, FM, Osteopenia>  Prev lumbar surg from DrBotero, she uses OTC analgesics, calcium, MVI... Due to right leg pain she will need Ortho eval & she will call DrGraves for this purpose...  Vit D Deficiency>  She never took the OTC Vit D supplements prev rec; so we will start VitD 50K weekly...  Anxiety>  On Xanax as needed...  PANCYTOPENIA, Vit B12 Defic/ PA> Labs 1/14 w/ new pancytopenia and MCV=121; she was to ret for additional labs but never did- Labs 7/14 show B12=27 c/w severe PA & we started B12 shots but she only took them for 6 week; nonetheless her Hg is norm & MCV improved to 87- Rec to take B12 shots 1006m/mo for life!!!

## 2013-07-11 LAB — VITAMIN D 25 HYDROXY (VIT D DEFICIENCY, FRACTURES): Vit D, 25-Hydroxy: 23 ng/mL — ABNORMAL LOW (ref 30–89)

## 2013-07-17 ENCOUNTER — Telehealth: Payer: Self-pay | Admitting: Pulmonary Disease

## 2013-07-17 NOTE — Telephone Encounter (Signed)
Called and spoke with alicia from [employer].  Given the dx code for the fluticasone.  No other meds were listed in the pts chart that she has tried in the past.  They will review this information and make a decision about this medication and they will call back to make [place] aware and they will contact the pt and mail out letters.

## 2013-07-25 ENCOUNTER — Other Ambulatory Visit: Payer: Self-pay | Admitting: Pulmonary Disease

## 2013-08-05 ENCOUNTER — Telehealth: Payer: Self-pay | Admitting: Pulmonary Disease

## 2013-08-05 NOTE — Telephone Encounter (Signed)
Patient calling back.  She did find xanax rx. Nothing else needed.

## 2013-08-12 ENCOUNTER — Ambulatory Visit: Payer: Medicare Other

## 2013-09-04 ENCOUNTER — Observation Stay (HOSPITAL_COMMUNITY)
Admission: EM | Admit: 2013-09-04 | Discharge: 2013-09-05 | Disposition: A | Discharge status: Home / Self Care | Payer: Medicare Other | Attending: Internal Medicine | Admitting: Internal Medicine

## 2013-09-04 ENCOUNTER — Encounter (HOSPITAL_COMMUNITY): Payer: Self-pay | Admitting: Emergency Medicine

## 2013-09-04 ENCOUNTER — Emergency Department (HOSPITAL_COMMUNITY): Payer: Medicare Other

## 2013-09-04 DIAGNOSIS — E785 Hyperlipidemia, unspecified: Secondary | ICD-10-CM | POA: Diagnosis present

## 2013-09-04 DIAGNOSIS — I10 Essential (primary) hypertension: Secondary | ICD-10-CM | POA: Diagnosis present

## 2013-09-04 DIAGNOSIS — D649 Anemia, unspecified: Secondary | ICD-10-CM

## 2013-09-04 DIAGNOSIS — R079 Chest pain, unspecified: Secondary | ICD-10-CM | POA: Diagnosis present

## 2013-09-04 DIAGNOSIS — R55 Syncope and collapse: Secondary | ICD-10-CM

## 2013-09-04 DIAGNOSIS — K7689 Other specified diseases of liver: Secondary | ICD-10-CM

## 2013-09-04 DIAGNOSIS — Z882 Allergy status to sulfonamides status: Secondary | ICD-10-CM | POA: Insufficient documentation

## 2013-09-04 DIAGNOSIS — E559 Vitamin D deficiency, unspecified: Secondary | ICD-10-CM

## 2013-09-04 DIAGNOSIS — Z888 Allergy status to other drugs, medicaments and biological substances status: Secondary | ICD-10-CM | POA: Insufficient documentation

## 2013-09-04 DIAGNOSIS — Z7902 Long term (current) use of antithrombotics/antiplatelets: Secondary | ICD-10-CM | POA: Insufficient documentation

## 2013-09-04 DIAGNOSIS — Z8719 Personal history of other diseases of the digestive system: Secondary | ICD-10-CM | POA: Insufficient documentation

## 2013-09-04 DIAGNOSIS — R110 Nausea: Secondary | ICD-10-CM | POA: Insufficient documentation

## 2013-09-04 DIAGNOSIS — F411 Generalized anxiety disorder: Secondary | ICD-10-CM

## 2013-09-04 DIAGNOSIS — D519 Vitamin B12 deficiency anemia, unspecified: Secondary | ICD-10-CM

## 2013-09-04 DIAGNOSIS — K5730 Diverticulosis of large intestine without perforation or abscess without bleeding: Secondary | ICD-10-CM

## 2013-09-04 DIAGNOSIS — Z8673 Personal history of transient ischemic attack (TIA), and cerebral infarction without residual deficits: Secondary | ICD-10-CM | POA: Insufficient documentation

## 2013-09-04 DIAGNOSIS — M899 Disorder of bone, unspecified: Secondary | ICD-10-CM | POA: Insufficient documentation

## 2013-09-04 DIAGNOSIS — M79604 Pain in right leg: Secondary | ICD-10-CM

## 2013-09-04 DIAGNOSIS — M949 Disorder of cartilage, unspecified: Secondary | ICD-10-CM

## 2013-09-04 DIAGNOSIS — Z79899 Other long term (current) drug therapy: Secondary | ICD-10-CM | POA: Insufficient documentation

## 2013-09-04 DIAGNOSIS — M545 Low back pain: Secondary | ICD-10-CM

## 2013-09-04 DIAGNOSIS — I2510 Atherosclerotic heart disease of native coronary artery without angina pectoris: Secondary | ICD-10-CM | POA: Diagnosis present

## 2013-09-04 DIAGNOSIS — Z885 Allergy status to narcotic agent status: Secondary | ICD-10-CM | POA: Insufficient documentation

## 2013-09-04 DIAGNOSIS — G459 Transient cerebral ischemic attack, unspecified: Secondary | ICD-10-CM

## 2013-09-04 DIAGNOSIS — R071 Chest pain on breathing: Principal | ICD-10-CM | POA: Insufficient documentation

## 2013-09-04 DIAGNOSIS — J309 Allergic rhinitis, unspecified: Secondary | ICD-10-CM

## 2013-09-04 DIAGNOSIS — Z85828 Personal history of other malignant neoplasm of skin: Secondary | ICD-10-CM | POA: Insufficient documentation

## 2013-09-04 DIAGNOSIS — IMO0001 Reserved for inherently not codable concepts without codable children: Secondary | ICD-10-CM

## 2013-09-04 DIAGNOSIS — M797 Fibromyalgia: Secondary | ICD-10-CM

## 2013-09-04 DIAGNOSIS — Z9109 Other allergy status, other than to drugs and biological substances: Secondary | ICD-10-CM | POA: Insufficient documentation

## 2013-09-04 DIAGNOSIS — E669 Obesity, unspecified: Secondary | ICD-10-CM

## 2013-09-04 LAB — CBC
HCT: 45.2 % (ref 36.0–46.0)
Hemoglobin: 15.4 g/dL — ABNORMAL HIGH (ref 12.0–15.0)
MCH: 29.7 pg (ref 26.0–34.0)
MCHC: 34.1 g/dL (ref 30.0–36.0)
MCV: 87.3 fL (ref 78.0–100.0)
Platelets: 173 10*3/uL (ref 150–400)
RBC: 5.18 MIL/uL — ABNORMAL HIGH (ref 3.87–5.11)
RDW: 13.2 % (ref 11.5–15.5)
WBC: 4.9 10*3/uL (ref 4.0–10.5)

## 2013-09-04 LAB — I-STAT TROPONIN, ED: TROPONIN I, POC: 0 ng/mL (ref 0.00–0.08)

## 2013-09-04 LAB — BASIC METABOLIC PANEL
BUN: 10 mg/dL (ref 6–23)
CHLORIDE: 99 meq/L (ref 96–112)
CO2: 28 meq/L (ref 19–32)
Calcium: 9.9 mg/dL (ref 8.4–10.5)
Creatinine, Ser: 0.75 mg/dL (ref 0.50–1.10)
GFR calc Af Amer: >90 mL/min (ref >90)
GFR calc non Af Amer: 83 mL/min — ABNORMAL LOW (ref >90)
Glucose, Bld: 101 mg/dL — ABNORMAL HIGH (ref 70–99)
POTASSIUM: 4.5 meq/L (ref 3.7–5.3)
Sodium: 140 mEq/L (ref 137–147)

## 2013-09-04 LAB — TROPONIN I

## 2013-09-04 MED ORDER — POTASSIUM CHLORIDE CRYS ER 20 MEQ PO TBCR
20.0000 meq | EXTENDED_RELEASE_TABLET | Freq: Two times a day (BID) | ORAL | Status: DC
  Administered 2013-09-04 – 2013-09-05 (×2): 20 meq via ORAL
  Filled 2013-09-04 (×3): qty 1

## 2013-09-04 MED ORDER — CLOPIDOGREL BISULFATE 75 MG PO TABS
75.0000 mg | ORAL_TABLET | Freq: Every day | ORAL | Status: DC
  Administered 2013-09-05: 75 mg via ORAL
  Filled 2013-09-04: qty 1

## 2013-09-04 MED ORDER — FUROSEMIDE 20 MG PO TABS
20.0000 mg | ORAL_TABLET | Freq: Every day | ORAL | Status: DC
  Filled 2013-09-04: qty 1

## 2013-09-04 MED ORDER — NITROGLYCERIN 2 % TD OINT
0.5000 [in_us] | TOPICAL_OINTMENT | Freq: Four times a day (QID) | TRANSDERMAL | Status: DC
  Administered 2013-09-04: 0.5 [in_us] via TOPICAL
  Filled 2013-09-04: qty 1
  Filled 2013-09-04 (×9): qty 30

## 2013-09-04 MED ORDER — ALPRAZOLAM 0.5 MG PO TABS
0.5000 mg | ORAL_TABLET | Freq: Three times a day (TID) | ORAL | Status: DC | PRN
  Administered 2013-09-05: 0.5 mg via ORAL
  Filled 2013-09-04: qty 1

## 2013-09-04 MED ORDER — MORPHINE SULFATE 4 MG/ML IJ SOLN
4.0000 mg | Freq: Once | INTRAMUSCULAR | Status: AC
  Administered 2013-09-04: 4 mg via INTRAVENOUS
  Filled 2013-09-04: qty 1

## 2013-09-04 MED ORDER — ONDANSETRON HCL 4 MG/2ML IJ SOLN
4.0000 mg | Freq: Once | INTRAMUSCULAR | Status: AC
  Administered 2013-09-04: 4 mg via INTRAVENOUS
  Filled 2013-09-04: qty 2

## 2013-09-04 MED ORDER — MORPHINE SULFATE 2 MG/ML IJ SOLN
2.0000 mg | Freq: Once | INTRAMUSCULAR | Status: AC
  Administered 2013-09-04: 2 mg via INTRAVENOUS
  Filled 2013-09-04: qty 1

## 2013-09-04 MED ORDER — MORPHINE SULFATE 4 MG/ML IJ SOLN: 4.0000 mg | Freq: Once | INTRAMUSCULAR | Status: DC

## 2013-09-04 NOTE — ED Notes (Signed)
Pt in c/o chest pain since 3pm today, states pain is to center of chest and radiates into her back and bilateral sides of her neck, also nausea with this, took 325mg  ASA prior to arrival

## 2013-09-04 NOTE — ED Notes (Signed)
Called report to 3W. Patient currently having 4/10 active CP. Accepting RN, Everlene Farrier, asked RN to wait 15 minutes and reassess CP before giving report on patient. Charge notified.

## 2013-09-04 NOTE — ED Provider Notes (Signed)
CSN: [id number]     Arrival date & time 09/04/13  1828 History   First MD Initiated Contact with Patient 09/04/13 1909     Chief Complaint  Patient presents with  . Chest Pain     (Consider location/radiation/quality/duration/timing/severity/associated sxs/prior Treatment) HPI Patient presents with chest pain.  Pain began in the hours prior to arrival, while the patient was at rest. Sometimes the pain has been persistent, is anterior, sore, nonradiating, severe. There is mild associated nausea, but no vomiting, no syncope. There is no dyspnea. Patient was in her usual state of health prior to the onset of symptoms. Since onset no clear alleviating or just factors, no pleuritic or exertional changes. Patient has a history of CAD, atrial fibrillation for which she takes Plavix.  Past Medical History  Diagnosis Date  . Unspecified essential hypertension   . CAD (coronary artery disease)   . Other and unspecified hyperlipidemia   . Obesity, unspecified   . Diverticulosis of colon (without mention of hemorrhage)   . Other chronic nonalcoholic liver disease   . Fibromyalgia   . Disorder of bone and cartilage, unspecified   . Dizziness and giddiness   . Anxiety state, unspecified   . Fatty liver   . Skin cancer   . TIA (transient ischemic attack)    Past Surgical History  Procedure Laterality Date  . Tonsillectomy and adenoidectomy    . Total abdominal hysterectomy w/ bilateral salpingoophorectomy    . Cholecystectomy    . Back surgery    . Mohs surgery     Family History  Problem Relation Age of Onset  . Stomach cancer Mother   . Cancer Brother   . Breast cancer Sister   . Diabetes     History  Substance Use Topics  . Smoking status: Never Smoker   . Smokeless tobacco: Never Used  . Alcohol Use: No   OB History   Grav Para Term Preterm Abortions TAB SAB Ect Mult Living                 Review of Systems  Constitutional:       Per HPI, otherwise negative  HENT:        Per HPI, otherwise negative  Respiratory:       Per HPI, otherwise negative  Cardiovascular:       Per HPI, otherwise negative  Gastrointestinal: Negative for vomiting.  Endocrine:       Negative aside from HPI  Genitourinary:       Neg aside from HPI   Musculoskeletal:       Per HPI, otherwise negative  Skin: Negative.   Neurological: Negative for syncope.      Allergies  Diltiazem hcl; Codeine; Sulfonamide derivatives; Tape; and Tramadol  Home Medications   Current Outpatient Rx  Name  Route  Sig  Dispense  Refill  . ALPRAZolam (XANAX) 0.5 MG tablet   Oral   Take 0.5 mg by mouth 3 (three) times daily as needed for anxiety.         . clopidogrel (PLAVIX) 75 MG tablet   Oral   Take 1 tablet (75 mg total) by mouth daily.   90 tablet   3   . clotrimazole-betamethasone (LOTRISONE) cream   Topical   Apply 1 application topically 2 (two) times daily.         . furosemide (LASIX) 20 MG tablet   Oral   Take 1 tablet (20 mg total) by mouth daily.  90 tablet   3   . potassium chloride SA (K-DUR,KLOR-CON) 20 MEQ tablet   Oral   Take 1 tablet (20 mEq total) by mouth 2 (two) times daily.   180 tablet   3   . Vitamin D, Ergocalciferol, (DRISDOL) 50000 UNITS CAPS capsule   Oral   Take 50,000 Units by mouth every Sunday.          BP 154/85  Pulse 74  Temp(Src) 97.6 F (36.4 C) (Oral)  Resp 12  Wt 186 lb (84.369 kg)  SpO2 99% Physical Exam  Nursing note and vitals reviewed. Constitutional: She is oriented to person, place, and time. She appears well-developed and well-nourished. No distress.  HENT:  Head: Normocephalic and atraumatic.  Eyes: Conjunctivae and EOM are normal.  Cardiovascular: Normal rate and regular rhythm.   Pulmonary/Chest: Effort normal and breath sounds normal. No stridor. No respiratory distress.  Abdominal: She exhibits no distension.  Musculoskeletal: She exhibits no edema.  Neurological: She is alert and oriented to person,  place, and time. No cranial nerve deficit.  Skin: Skin is warm and dry.  Psychiatric: She has a normal mood and affect.    ED Course  Procedures (including critical care time) Labs Review Labs Reviewed  CBC - Abnormal; Notable for the following:    RBC 5.18 (*)    Hemoglobin 15.4 (*)    All other components within normal limits  BASIC METABOLIC PANEL - Abnormal; Notable for the following:    Glucose, Bld 101 (*)    GFR calc non Af Amer 83 (*)    All other components within normal limits  I-STAT TROPOININ, ED   Imaging Review Dg Chest 2 View  09/04/2013   CLINICAL DATA:  Chest and back pain  EXAM: CHEST  2 VIEW  COMPARISON:  07/13/2011  FINDINGS: The heart size and mediastinal contours are within normal limits. Both lungs are clear. The visualized skeletal structures are unremarkable.  IMPRESSION: No active cardiopulmonary disease.   Electronically Signed   By: Kerby Moors M.D.   On: 09/04/2013 19:58     EKG Interpretation   Date/Time:  Wednesday September 04 2013 18:34:13 EDT Ventricular Rate:  76 PR Interval:  176 QRS Duration: 94 QT Interval:  424 QTC Calculation: 477 R Axis:   -59 Text Interpretation:  Normal sinus rhythm Low voltage QRS Incomplete right  bundle branch block Left anterior fascicular block Cannot rule out  Anterior infarct , age undetermined Abnormal ECG Sinus rhythm Non-specific  intra-ventricular conduction delay Left axis deviation No significant  change since last tracing Abnormal ekg Confirmed by Carmin Muskrat  MD  ([id number]) on 09/04/2013 8:08:31 PM      o2- 99%ra, nml  9:27 PM Pain substantially improved.  No new complaints.  All results d/w the patient and her family.   MDM   Final diagnoses:  Chest pain    Patient presents with chest pain.  Notably, the patient is on Plavix already, and has initially reassuring results, both labs, EKG, chest x-ray. Patient does, however, have mild persistent chest pain. She is otherwise in no  distress, hemodynamically stable.  She was admitted for further evaluation and management.    Carmin Muskrat, MD 09/04/13 2312

## 2013-09-04 NOTE — H&P (Signed)
[facility] History and Physical  [name] YIR:[id number] DOB: [birth year] DOA: 09/04/2013  Referring physician: EDP PCP: Noralee Space, MD  Specialists:   Chief Complaint:  Chest Pain  HPI: [name] is a 73 y.o. female with a history of CAD, who presents to the ED with complaints of Substernal Chest pain radiating into her back and neck and jaws since 3 pm while she was waiting in the  [place] office with her husband for his appointment. .   She described the pain as a constant heaviness that has been associated SOB and nausea, but no diaphoresis.  She rated the pain as 8/10 at the worst.   She took Aspirin prior to arrival to the ED.  In the ED , she was evaluated and had a negative initial workup and was referred for a cardiac rule out.        Review of Systems:  Constitutional: No Weight Loss, No Weight Gain, Night Sweats, Fevers, Chills, Fatigue, or Generalized Weakness HEENT: No Headaches, Difficulty Swallowing,Tooth/Dental Problems,Sore Throat,  No Sneezing, Rhinitis, Ear Ache, Nasal Congestion, or Post Nasal Drip,  Cardio-vascular:  +Chest pain, Orthopnea, PND, Edema in lower extremities, Anasarca, Dizziness, Palpitations  Resp: +Dyspnea, No DOE, No Productive Cough, No Non-Productive Cough, No Hemoptysis, No Change in Color of Mucus,  No Wheezing.    GI: No Heartburn, Indigestion, Abdominal Pain, +Nausea, Vomiting, Diarrhea, Change in Bowel Habits,  Loss of Appetite  GU: No Dysuria, Change in Color of Urine, No Urgency or Frequency.  No flank pain.  Musculoskeletal: No Joint Pain or Swelling.  No Decreased Range of Motion. No Back Pain.  Neurologic: No Syncope, No Seizures, Muscle Weakness, Paresthesia, Vision Disturbance or Loss, No Diplopia, No Vertigo, No Difficulty Walking,  Skin: No Rash or Lesions. Psych: No Change in Mood or Affect. No Depression or Anxiety. No Memory loss. No Confusion or Hallucinations   Past Medical History  Diagnosis Date  .  Unspecified essential hypertension   . CAD (coronary artery disease)   . Other and unspecified hyperlipidemia   . Obesity, unspecified   . Diverticulosis of colon (without mention of hemorrhage)   . Other chronic nonalcoholic liver disease   . Fibromyalgia   . Disorder of bone and cartilage, unspecified   . Dizziness and giddiness   . Anxiety state, unspecified   . Fatty liver   . Skin cancer   . TIA (transient ischemic attack)       Past Surgical History  Procedure Laterality Date  . Tonsillectomy and adenoidectomy    . Total abdominal hysterectomy w/ bilateral salpingoophorectomy    . Cholecystectomy    . Back surgery    . Mohs surgery         Prior to Admission medications   Medication Sig Start Date End Date Taking? Authorizing Provider  ALPRAZolam Duanne Moron) 0.5 MG tablet Take 0.5 mg by mouth 3 (three) times daily as needed for anxiety.   Yes Historical Provider, MD  clopidogrel (PLAVIX) 75 MG tablet Take 1 tablet (75 mg total) by mouth daily. 07/10/13  Yes Noralee Space, MD  clotrimazole-betamethasone (LOTRISONE) cream Apply 1 application topically 2 (two) times daily.   Yes Historical Provider, MD  furosemide (LASIX) 20 MG tablet Take 1 tablet (20 mg total) by mouth daily. 07/10/13  Yes Noralee Space, MD  potassium chloride SA (K-DUR,KLOR-CON) 20 MEQ tablet Take 1 tablet (20 mEq total) by mouth 2 (two) times daily. 07/10/13  Yes Noralee Space, MD  Vitamin D, Ergocalciferol, (DRISDOL) 50000 UNITS CAPS capsule Take 50,000 Units by mouth every Sunday.   Yes Historical Provider, MD      Allergies  Allergen Reactions  . Diltiazem Hcl Other (See Comments)    REACTION: pt states it made her dizzy...  . Codeine Nausea Only  . Sulfonamide Derivatives Hives, Itching and Rash  . Tape Itching  . Tramadol Itching     Social History:  reports that she has never smoked. She has never used smokeless tobacco. She reports that she does not drink alcohol or use illicit drugs.      Family History  Problem Relation Age of Onset  . Stomach cancer Mother   . Cancer Brother   . Breast cancer Sister   . Diabetes         Physical Exam:  GEN:  Pleasant Obese  73 y.o. Caucasian female  examined  and in no acute distress; cooperative with exam Filed Vitals:   09/04/13 2115 09/04/13 2157 09/04/13 2200 09/04/13 2230  BP: 103/38 112/50  112/53  Pulse: 58 51  53  Temp:      TempSrc:      Resp: 13 9 18 18   Weight:      SpO2: 89% 96%  98%   Blood pressure 112/53, pulse 53, temperature 98.7 F (37.1 C), temperature source Oral, resp. rate 18, weight 84.369 kg (186 lb), SpO2 98.00%. PSYCH: She is alert and oriented x4; does not appear anxious does not appear depressed; affect is normal HEENT: Normocephalic and Atraumatic, Mucous membranes pink; PERRLA; EOM intact; Fundi:  Benign;  No scleral icterus, Nares: Patent, Oropharynx: Clear, Fair Dentition, Neck:  FROM, no cervical lymphadenopathy nor thyromegaly or carotid bruit; no JVD; Breasts:: Not examined CHEST WALL: No tenderness CHEST: Normal respiration, clear to auscultation bilaterally HEART: Regular rate and rhythm; no murmurs rubs or gallops BACK: No kyphosis or scoliosis; no CVA tenderness ABDOMEN: Positive Bowel Sounds, Scaphoid, Obese, soft non-tender; no masses, no organomegaly. Rectal Exam: Not done EXTREMITIES: No bone or joint deformity; age-appropriate arthropathy of the hands and knees; no cyanosis, clubbing or edema; no ulcerations. Genitalia: not examined PULSES: 2+ and symmetric SKIN: Normal hydration no rash or ulceration CNS:   Vascular: pulses palpable throughout    Labs on Admission:  Basic Metabolic Panel:  Recent Labs Lab 09/04/13 1835  NA 140  K 4.5  CL 99  CO2 28  GLUCOSE 101*  BUN 10  CREATININE 0.75  CALCIUM 9.9   Liver Function Tests: No results found for this basename: AST, ALT, ALKPHOS, BILITOT, PROT, ALBUMIN,  in the last 168 hours No results found for this basename:  LIPASE, AMYLASE,  in the last 168 hours No results found for this basename: AMMONIA,  in the last 168 hours CBC:  Recent Labs Lab 09/04/13 1835  WBC 4.9  HGB 15.4*  HCT 45.2  MCV 87.3  PLT 173   Cardiac Enzymes: No results found for this basename: CKTOTAL, CKMB, CKMBINDEX, TROPONINI,  in the last 168 hours  BNP (last 3 results) No results found for this basename: PROBNP,  in the last 8760 hours CBG: No results found for this basename: GLUCAP,  in the last 168 hours  Radiological Exams on Admission: Dg Chest 2 View  09/04/2013   CLINICAL DATA:  Chest and back pain  EXAM: CHEST  2 VIEW  COMPARISON:  07/13/2011  FINDINGS: The heart size and mediastinal contours are within normal limits. Both lungs are clear. The visualized skeletal structures  are unremarkable.  IMPRESSION: No active cardiopulmonary disease.   Electronically Signed   By: Kerby Moors M.D.   On: 09/04/2013 19:58     EKG: Independently reviewed. Sinus at rate 60 No acute S-T changes   Assessment/Plan:   73 y.o. female with  Principal Problem:   Chest pain Active Problems:   CAD (coronary artery disease)   HYPERLIPIDEMIA   HTN (hypertension)    1.   Chest Pain-   Admit to Observation Telemetry Bed for Cardiac Monitoring, Nitropaste O2,a dn ASA Rx,  Bradycardic so no Beta Blocker RX,  Cycle Troponins.     2.   CAD- HX on Plavix. Rx.    3.   HTN-  On Lasix w ith KCL Rx.   BPs low normal at this time monitor.    4.   Hyperlipidemia- Check Lipids in AM.     5. DVT prophylaxis wit Lovenox.        Code Status:   FULL CODE Family Communication:    Daughter at Bedside Disposition Plan:    Inpatient  Time spent:  [facility] Pager [phone]  If 7PM-7AM, please contact night-coverage [email or web address] Password [email or web address] 09/04/2013, 10:41 PM

## 2013-09-05 ENCOUNTER — Observation Stay (HOSPITAL_COMMUNITY): Payer: Medicare Other

## 2013-09-05 ENCOUNTER — Encounter (HOSPITAL_COMMUNITY): Payer: Self-pay | Admitting: Anesthesiology

## 2013-09-05 DIAGNOSIS — R079 Chest pain, unspecified: Secondary | ICD-10-CM

## 2013-09-05 DIAGNOSIS — F411 Generalized anxiety disorder: Secondary | ICD-10-CM

## 2013-09-05 DIAGNOSIS — J309 Allergic rhinitis, unspecified: Secondary | ICD-10-CM

## 2013-09-05 DIAGNOSIS — I2510 Atherosclerotic heart disease of native coronary artery without angina pectoris: Secondary | ICD-10-CM

## 2013-09-05 DIAGNOSIS — D518 Other vitamin B12 deficiency anemias: Secondary | ICD-10-CM

## 2013-09-05 LAB — CBC
HEMATOCRIT: 41.8 % (ref 36.0–46.0)
Hemoglobin: 14.1 g/dL (ref 12.0–15.0)
MCH: 29.7 pg (ref 26.0–34.0)
MCHC: 33.7 g/dL (ref 30.0–36.0)
MCV: 88.2 fL (ref 78.0–100.0)
PLATELETS: 163 10*3/uL (ref 150–400)
RBC: 4.74 MIL/uL (ref 3.87–5.11)
RDW: 13.3 % (ref 11.5–15.5)
WBC: 4.5 10*3/uL (ref 4.0–10.5)

## 2013-09-05 LAB — BASIC METABOLIC PANEL
BUN: 12 mg/dL (ref 6–23)
CALCIUM: 9.2 mg/dL (ref 8.4–10.5)
CHLORIDE: 101 meq/L (ref 96–112)
CO2: 26 mEq/L (ref 19–32)
Creatinine, Ser: 0.74 mg/dL (ref 0.50–1.10)
GFR calc Af Amer: >90 mL/min (ref >90)
GFR calc non Af Amer: 83 mL/min — ABNORMAL LOW (ref >90)
GLUCOSE: 101 mg/dL — AB (ref 70–99)
Potassium: 4.5 mEq/L (ref 3.7–5.3)
Sodium: 140 mEq/L (ref 137–147)

## 2013-09-05 LAB — LIPID PANEL
Cholesterol: 213 mg/dL — ABNORMAL HIGH (ref 0–200)
HDL: 48 mg/dL (ref >39)
LDL Cholesterol: 150 mg/dL — ABNORMAL HIGH (ref 0–99)
Total CHOL/HDL Ratio: 4.4 RATIO
Triglycerides: 73 mg/dL (ref <150)
VLDL: 15 mg/dL (ref 0–40)

## 2013-09-05 LAB — TROPONIN I

## 2013-09-05 MED ORDER — SIMVASTATIN 20 MG PO TABS
20.0000 mg | ORAL_TABLET | Freq: Every day | ORAL | Status: DC
  Filled 2013-09-05: qty 1

## 2013-09-05 MED ORDER — ASPIRIN EC 325 MG PO TBEC
325.0000 mg | DELAYED_RELEASE_TABLET | Freq: Every day | ORAL | Status: DC
  Administered 2013-09-05: 325 mg via ORAL
  Filled 2013-09-05: qty 1

## 2013-09-05 MED ORDER — SIMVASTATIN 20 MG PO TABS: 20.0000 mg | ORAL_TABLET | Freq: Every day | ORAL | Status: DC

## 2013-09-05 MED ORDER — PROMETHAZINE HCL 25 MG/ML IJ SOLN
12.5000 mg | Freq: Four times a day (QID) | INTRAMUSCULAR | Status: DC | PRN
  Administered 2013-09-05: 12.5 mg via INTRAVENOUS
  Filled 2013-09-05: qty 1

## 2013-09-05 MED ORDER — OXYCODONE HCL 5 MG PO TABS: 5.0000 mg | ORAL_TABLET | ORAL | Status: DC | PRN

## 2013-09-05 MED ORDER — SODIUM CHLORIDE 0.9 % IJ SOLN
3.0000 mL | Freq: Two times a day (BID) | INTRAMUSCULAR | Status: DC
  Administered 2013-09-05: 3 mL via INTRAVENOUS

## 2013-09-05 MED ORDER — NITROGLYCERIN 0.4 MG SL SUBL: 0.4000 mg | SUBLINGUAL_TABLET | SUBLINGUAL | Status: DC | PRN

## 2013-09-05 MED ORDER — PANTOPRAZOLE SODIUM 40 MG PO TBEC: 40.0000 mg | DELAYED_RELEASE_TABLET | Freq: Every day | ORAL | Status: DC

## 2013-09-05 MED ORDER — ALUM & MAG HYDROXIDE-SIMETH 200-200-20 MG/5ML PO SUSP: 30.0000 mL | Freq: Four times a day (QID) | ORAL | Status: DC | PRN

## 2013-09-05 MED ORDER — HYDROMORPHONE HCL PF 1 MG/ML IJ SOLN: 0.5000 mg | INTRAMUSCULAR | Status: DC | PRN

## 2013-09-05 MED ORDER — SODIUM CHLORIDE 0.9 % IV SOLN: INTRAVENOUS | Status: DC

## 2013-09-05 MED ORDER — ACETAMINOPHEN 325 MG PO TABS: 650.0000 mg | ORAL_TABLET | Freq: Four times a day (QID) | ORAL | Status: DC | PRN

## 2013-09-05 MED ORDER — TECHNETIUM TC 99M SESTAMIBI GENERIC - CARDIOLITE
30.0000 | Freq: Once | INTRAVENOUS | Status: AC | PRN
  Administered 2013-09-05: 30 via INTRAVENOUS

## 2013-09-05 MED ORDER — REGADENOSON 0.4 MG/5ML IV SOLN
INTRAVENOUS | Status: AC
  Administered 2013-09-05: 0.4 mg via INTRAVENOUS
  Filled 2013-09-05: qty 5

## 2013-09-05 MED ORDER — OMEGA-3-ACID ETHYL ESTERS 1 G PO CAPS: 1.0000 g | ORAL_CAPSULE | Freq: Two times a day (BID) | ORAL | Status: DC

## 2013-09-05 MED ORDER — REGADENOSON 0.4 MG/5ML IV SOLN
0.4000 mg | Freq: Once | INTRAVENOUS | Status: AC
  Administered 2013-09-05: 0.4 mg via INTRAVENOUS
  Filled 2013-09-05: qty 5

## 2013-09-05 MED ORDER — ONDANSETRON HCL 4 MG/2ML IJ SOLN
4.0000 mg | Freq: Four times a day (QID) | INTRAMUSCULAR | Status: DC | PRN
  Administered 2013-09-05: 4 mg via INTRAVENOUS
  Filled 2013-09-05: qty 2

## 2013-09-05 MED ORDER — ENOXAPARIN SODIUM 40 MG/0.4ML ~~LOC~~ SOLN
40.0000 mg | SUBCUTANEOUS | Status: DC
  Filled 2013-09-05: qty 0.4

## 2013-09-05 MED ORDER — ONDANSETRON HCL 4 MG PO TABS: 4.0000 mg | ORAL_TABLET | Freq: Four times a day (QID) | ORAL | Status: DC | PRN

## 2013-09-05 MED ORDER — MORPHINE SULFATE 4 MG/ML IJ SOLN: 6.0000 mg | INTRAMUSCULAR | Status: DC | PRN

## 2013-09-05 MED ORDER — TECHNETIUM TC 99M SESTAMIBI GENERIC - CARDIOLITE
10.0000 | Freq: Once | INTRAVENOUS | Status: AC | PRN
  Administered 2013-09-05: 10 via INTRAVENOUS

## 2013-09-05 MED ORDER — OMEGA-3-ACID ETHYL ESTERS 1 G PO CAPS
1.0000 g | ORAL_CAPSULE | Freq: Two times a day (BID) | ORAL | Status: DC
  Administered 2013-09-05: 1 g via ORAL
  Filled 2013-09-05 (×2): qty 1

## 2013-09-05 MED ORDER — ACETAMINOPHEN 650 MG RE SUPP: 650.0000 mg | Freq: Four times a day (QID) | RECTAL | Status: DC | PRN

## 2013-09-05 NOTE — Discharge Summary (Signed)
Physician Discharge Summary  Patient ID: [name] MRN: [id number] DOB/AGE: 73/11/1940 73 y.o.  Admit date: 09/04/2013 Discharge date: 09/05/2013  Primary Care Physician:  Noralee Space, MD  Discharge Diagnoses:    . Chest pain . CAD (coronary artery disease) . HTN (hypertension) . HYPERLIPIDEMIA  Consults:  Cardiology, Dr Radford Pax     Allergies:   Allergies  Allergen Reactions  . Diltiazem Hcl Other (See Comments)    REACTION: pt states it made her dizzy...  . Codeine Nausea Only  . Sulfonamide Derivatives Hives, Itching and Rash  . Tape Itching  . Tramadol Itching     Discharge Medications:   Medication List         ALPRAZolam 0.5 MG tablet  Commonly known as:  XANAX  Take 0.5 mg by mouth 3 (three) times daily as needed for anxiety.     clopidogrel 75 MG tablet  Commonly known as:  PLAVIX  Take 1 tablet (75 mg total) by mouth daily.     clotrimazole-betamethasone cream  Commonly known as:  LOTRISONE  Apply 1 application topically 2 (two) times daily.     furosemide 20 MG tablet  Commonly known as:  LASIX  Take 1 tablet (20 mg total) by mouth daily.     nitroGLYCERIN 0.4 MG SL tablet  Commonly known as:  NITROSTAT  Place 1 tablet (0.4 mg total) under the tongue every 5 (five) minutes as needed for chest pain.     omega-3 acid ethyl esters 1 G capsule  Commonly known as:  LOVAZA  Take 1 capsule (1 g total) by mouth 2 (two) times daily.     pantoprazole 40 MG tablet  Commonly known as:  PROTONIX  Take 1 tablet (40 mg total) by mouth daily.     potassium chloride SA 20 MEQ tablet  Commonly known as:  K-DUR,KLOR-CON  Take 1 tablet (20 mEq total) by mouth 2 (two) times daily.     Vitamin D (Ergocalciferol) 50000 UNITS Caps capsule  Commonly known as:  DRISDOL  Take 50,000 Units by mouth every Sunday.         Brief H and P: For complete details please refer to admission H and P, but in briefCynthia E [name] is a 73 y.o. female with a history  of CAD, who presents to the ED with complaints of Substernal Chest pain radiating into her back and neck and jaws since 3 pm while she was waiting in the [place] office with her husband for his appointment. . She described the pain as a constant heaviness that has been associated SOB and nausea, but no diaphoresis. She rated the pain as 8/10 at the worst. She took Aspirin prior to arrival to the ED. In the ED , she was evaluated and had a negative initial workup and was referred for a cardiac rule out.   Hospital Course:  Chest pain: Resolved cardiac risk factors include hypertension, dyslipidemia, obesity. She had a cath in 2009 which was nonobstructive, mild plaque. 3 sets of cardiac enzymes negative. EKG showed no acute ST-T wave changes suggestive of ischemia . Cardiology was consulted and recommended nuclear medicine stress test. Stress test showed EF of 68%, no ischemia.  HYPERLIPIDEMIA - Uncontrolled, cholesterol 213, LDL 150  - Reviewed previous records, per her cardiologist, Dr. Ron Parker note, she was placed on statins but unfortunately started having transaminitis after simvastatin. Then she was recommended Crestor 10 mg, Zetia but she was not able to tolerate it at all.  Per  office notes by her PCP, she has intolerance to all the statins. However 1/13, when she had TIA, patient was placed on Lipitor 20 mg daily with CoQ10 100mg /d. In 7/13, she did not want to be on any statins and preferred diet alone.  Recommended patient to exercise, weight control, diet control and started on Lovaza.   HTN (hypertension)- stable   CAD (coronary artery disease)- chest pain now resolved, as #1   History of TIA: Continue Plavix        Day of Discharge BP 108/45  Pulse 72  Temp(Src) 98 F (36.7 C) (Oral)  Resp 16  Ht 5\' 2"  (1.575 m)  Wt 81.602 kg (179 lb 14.4 oz)  BMI 32.90 kg/m2  SpO2 96%  Physical Exam: General: Alert and awake oriented x3 not in any acute distress. HEENT: anicteric sclera,  pupils reactive to light and accommodation CVS: S1-S2 clear no murmur rubs or gallops Chest: clear to auscultation bilaterally, no wheezing rales or rhonchi Abdomen: soft nontender, nondistended, normal bowel sounds Extremities: no cyanosis, clubbing or edema noted bilaterally Neuro: Cranial nerves II-XII intact, no focal neurological deficits   The results of significant diagnostics from this hospitalization (including imaging, microbiology, ancillary and laboratory) are listed below for reference.    LAB RESULTS: Basic Metabolic Panel:  Recent Labs Lab 09/04/13 1835 09/05/13 0744  NA 140 140  K 4.5 4.5  CL 99 101  CO2 28 26  GLUCOSE 101* 101*  BUN 10 12  CREATININE 0.75 0.74  CALCIUM 9.9 9.2    CBC:  Recent Labs Lab 09/04/13 1835 09/05/13 0744  WBC 4.9 4.5  HGB 15.4* 14.1  HCT 45.2 41.8  MCV 87.3 88.2  PLT 173 163   Cardiac Enzymes:  Recent Labs Lab 09/04/13 2252 09/05/13 0744  TROPONINI <0.30 <0.30   BNP: No components found with this basename: POCBNP,  CBG: No results found for this basename: GLUCAP,  in the last 168 hours  Significant Diagnostic Studies:  Dg Chest 2 View  09/04/2013   CLINICAL DATA:  Chest and back pain  EXAM: CHEST  2 VIEW  COMPARISON:  07/13/2011  FINDINGS: The heart size and mediastinal contours are within normal limits. Both lungs are clear. The visualized skeletal structures are unremarkable.  IMPRESSION: No active cardiopulmonary disease.   Electronically Signed   By: Kerby Moors M.D.   On: 09/04/2013 19:58   Nuclear medicine stress test FINDINGS:  No ischemia. Normal radiotracer uptake at both rest and stress.  Normal EF 68%, no wall motion abnormalities.  IMPRESSION:  Low risk nuclear stress test. No ischemia, normal EF, 68%     Disposition and Follow-up:      Future Appointments Provider Department Dept Phone   01/07/2014 10:00 AM Noralee Space, MD [facility] [phone]   01/07/2014 10:30 AM Janith Lima, MD [facility] -[phone]       DISPOSITION: Home  DIET: Heart healthy diet    DISCHARGE FOLLOW-UP Follow-up Information   Follow up with NADEL,SCOTT M, MD. Schedule an appointment as soon as possible for a visit in 2 weeks. (for hospital follow-up)    Specialty:  Pulmonary Disease   Contact information:   [place] [id number] [phone]       Time spent on Discharge: 35 mins  Signed:   Zailyn Thoennes M.D. [facility] 09/05/2013, 2:15 PM Pager: [phone]

## 2013-09-05 NOTE — Progress Notes (Signed)
DC orders received.  Patient stable with no S/S of distress.  Medication and discharge information reviewed with patient and patient's family.  Patient DC home. [employer], Ardeth Sportsman

## 2013-09-05 NOTE — Progress Notes (Signed)
Lexiscan CL performed 

## 2013-09-05 NOTE — Consult Note (Signed)
Admit date: 09/04/2013 Referring Physician  Dr. Tana Coast Primary Physician  Dr. Lenna Gilford Primary Cardiologist  Dr. Ron Parker Reason for Consultation:  Chest pain   HPI: [name] is a 73 y.o. female with a history of nonobstructive ASCAD, who presents to the ED with complaints of Substernal Chest pain radiating into her back and neck and jaws since 3 pm while she was waiting in the [place] office with her husband for his appointment. . She described the pain as a constant heaviness that has been associated SOB and nausea, but no diaphoresis. She rated the pain as 8/10 at the worst. She took took her husband home and called her MD and was told to take 4 baby Aspirin.   In the ED , she was evaluated and had a negative initial workup and was referred for a cardiac rule out.  She was given NTG paste which resolved her CP.  She has not had any reoccurrence of her CP. Cardiology is now asked to consult for chest pain workup.  Her last cardiac workup was 05/2008 at which time a cath showed essentially normal coronary arteries with mild plaque in the mid and distal LAD and mid nondominant RCA.  She also has a history of syncope in the past with workup by EP including a Loop recorder for 2 years with no syncope until right after her explant and it was felt that she had neurally mediated syncope.        PMH:   Past Medical History  Diagnosis Date  . Essential hypertension   . CAD (coronary artery disease) - cath 05/2008 with very mild plaque in mid LAD/RCA with no stenosis   . Hyperlipidemia   . Obesity, unspecified   . Diverticulosis of colon (without mention of hemorrhage)   . Other chronic nonalcoholic liver disease   . Fibromyalgia   . Disorder of bone and cartilage, unspecified   . Dizziness and giddiness with syncope and loop recorder with no arrhythmias   . Anxiety state, unspecified   . Fatty liver   . Skin cancer   . TIA (transient ischemic attack)      PSH:   Past Surgical History  Procedure  Laterality Date  . Tonsillectomy and adenoidectomy    . Total abdominal hysterectomy w/ bilateral salpingoophorectomy    . Cholecystectomy    . Back surgery    . Mohs surgery      Allergies:  Diltiazem hcl; Codeine; Sulfonamide derivatives; Tape; and Tramadol Prior to Admit Meds:   Prescriptions prior to admission  Medication Sig Dispense Refill  . ALPRAZolam (XANAX) 0.5 MG tablet Take 0.5 mg by mouth 3 (three) times daily as needed for anxiety.      . clopidogrel (PLAVIX) 75 MG tablet Take 1 tablet (75 mg total) by mouth daily.  90 tablet  3  . clotrimazole-betamethasone (LOTRISONE) cream Apply 1 application topically 2 (two) times daily.      . furosemide (LASIX) 20 MG tablet Take 1 tablet (20 mg total) by mouth daily.  90 tablet  3  . potassium chloride SA (K-DUR,KLOR-CON) 20 MEQ tablet Take 1 tablet (20 mEq total) by mouth 2 (two) times daily.  180 tablet  3  . Vitamin D, Ergocalciferol, (DRISDOL) 50000 UNITS CAPS capsule Take 50,000 Units by mouth every Sunday.       Fam HX:    Family History  Problem Relation Age of Onset  . Stomach cancer Mother   . Cancer Brother   . Breast  cancer Sister   . Diabetes     Social HX:    History   Social History  . Marital Status: Married    Spouse Name: Thompson Grayer. Carico Sr.    Number of Children: 4  . Years of Education: N/A   Occupational History  .     Social History Main Topics  . Smoking status: Never Smoker   . Smokeless tobacco: Never Used  . Alcohol Use: No  . Drug Use: No  . Sexual Activity: Not on file   Other Topics Concern  . Not on file   Social History Narrative  . No narrative on file     ROS:  All 11 ROS were addressed and are negative except what is stated in the HPI  Physical Exam: Blood pressure 97/47, pulse 53, temperature 97.7 F (36.5 C), temperature source Oral, resp. rate 18, height 5\' 2"  (1.575 m), weight 179 lb 14.4 oz (81.602 kg), SpO2 95.00%.    General: Well developed, well nourished, in no  acute distress Head: Eyes PERRLA, No xanthomas.   Normal cephalic and atramatic  Lungs:   Clear bilaterally to auscultation and percussion. Heart:   HRRR S1 S2 Pulses are 2+ & equal.            No carotid bruit. No JVD.  No abdominal bruits. No femoral bruits. Abdomen: Bowel sounds are positive, abdomen soft and non-tender without masses  Extremities:   No clubbing, cyanosis or edema.  DP +1 Neuro: Alert and oriented X 3. Psych:  Good affect, responds appropriately    Labs:   Lab Results  Component Value Date   WBC 4.9 09/04/2013   HGB 15.4* 09/04/2013   HCT 45.2 09/04/2013   MCV 87.3 09/04/2013   PLT 173 09/04/2013    Recent Labs Lab 09/04/13 1835  NA 140  K 4.5  CL 99  CO2 28  BUN 10  CREATININE 0.75  CALCIUM 9.9  GLUCOSE 101*   No results found for this basename: PTT   Lab Results  Component Value Date   INR 1.0 RATIO 04/28/2008   Lab Results  Component Value Date   CKTOTAL 49 05/21/2008   CKMB 1.3 05/21/2008   TROPONINI <0.30 09/04/2013     Lab Results  Component Value Date   CHOL 235* 07/10/2013   CHOL 201* 07/06/2012   CHOL 238* 07/14/2011   Lab Results  Component Value Date   HDL 44.30 07/10/2013   HDL 39.20 07/06/2012   HDL 29* 07/14/2011   Lab Results  Component Value Date   LDLCALC 145* 07/14/2011   LDLCALC 96 12/14/2009   LDLCALC 89 12/15/2008   Lab Results  Component Value Date   TRIG 114.0 07/10/2013   TRIG 89.0 07/06/2012   TRIG 320* 07/14/2011   Lab Results  Component Value Date   CHOLHDL 5 07/10/2013   CHOLHDL 5 07/06/2012   CHOLHDL 8.2 07/14/2011   Lab Results  Component Value Date   LDLDIRECT 172.9 07/10/2013   LDLDIRECT 129.9 07/06/2012   LDLDIRECT 162.8 07/05/2011      Radiology:  Dg Chest 2 View  09/04/2013   CLINICAL DATA:  Chest and back pain  EXAM: CHEST  2 VIEW  COMPARISON:  07/13/2011  FINDINGS: The heart size and mediastinal contours are within normal limits. Both lungs are clear. The visualized skeletal structures are unremarkable.   IMPRESSION: No active cardiopulmonary disease.   Electronically Signed   By: Kerby Moors M.D.   On: 09/04/2013  19:58    EKG:  NSR, LAFB, IRBBB  ASSESSMENT:  1.  Chest pain with cardiac risk factors including obesity, HTN and dyslipidemia.  She had a cath in 2009 showing some mild plaque but no stenosis.  She has rule out by cardiac enzymes and EKG is nonischemic.  2. HTN 3. Dyslipidemia 4.  Remote history of syncope with history of loop recorder  PLAN:   1.  Lexiscan myoview to rule out ischemia  Sueanne Margarita, MD  09/05/2013  8:09 AM

## 2013-09-05 NOTE — Progress Notes (Signed)
Patient ID: [name]  female  VVO:[id number]    DOB: [birth year]    DOA: 09/04/2013  PCP: Noralee Space, MD  Assessment/Plan: Principal Problem:   Chest pain: Resolved cardiac risk factors include hypertension, dyslipidemia, obesity. She had a cath in 2009 which was nonobstructive, mild plaque - 2 sets of cardiac enzymes negative, EKG showed no acute ST-T wave changes suggestive of ischemia - Cardiology consulted, nuclear medicine stress test today  Active Problems:   HYPERLIPIDEMIA - Uncontrolled, cholesterol 213, LDL 150 - Reviewed previous records, per her cardiologist, Dr. Ron Parker note, she was placed on statins but unfortunately started having transaminitis after simvastatin -She was recommended Crestor 10 mg daily but she was not able to tolerate it at all, Patient was recommended to try Zetia but was unable to tolerate it.  - Per office notes by her PCP, she has intolerance to all the statins. However 1/13, when she had TIA, patient was placed on Lipitor 20 mg daily with CoQ10 100mg /d. In 7/13, she did not want to be on any statin and preferred diet alone. - Recommended patient to exercise, weight control, diet control and started on LOvaza      HTN (hypertension)- stable    CAD (coronary artery disease)- chest pain now resolved, as #1  History of TIA: Continue Plavix - If patient has positive stress test, may need to place on aspirin 81 mg daily    DVT Prophylaxis:  Code Status:  Family Communication: Discussed with the daughter at the bedside  Disposition: Hopefully DC home today if stress test is negative. Patient was and followup with her PCP  Consultants:  Cardiology   Procedures:   stress test   Antibiotics:  none    Subjective: Chest pain resolved, no nausea, vomiting, abdominal pain   Objective: Weight change:   Intake/Output Summary (Last 24 hours) at 09/05/13 0953 Last data filed at 09/04/13 2310  Gross per 24 hour  Intake      0 ml    Output    110 ml  Net   -110 ml   Blood pressure 97/47, pulse 53, temperature 97.7 F (36.5 C), temperature source Oral, resp. rate 18, height 5\' 2"  (1.575 m), weight 81.602 kg (179 lb 14.4 oz), SpO2 95.00%.  Physical Exam: General: Alert and awake, oriented x3, not in any acute distress. CVS: S1-S2 clear, no murmur rubs or gallops Chest: clear to auscultation bilaterally, no wheezing, rales or rhonchi Abdomen: soft nontender, nondistended, normal bowel sounds  Extremities: no cyanosis, clubbing or edema noted bilaterally Neuro: Cranial nerves II-XII intact, no focal neurological deficits  Lab Results: Basic Metabolic Panel:  Recent Labs Lab 09/04/13 1835 09/05/13 0744  NA 140 140  K 4.5 4.5  CL 99 101  CO2 28 26  GLUCOSE 101* 101*  BUN 10 12  CREATININE 0.75 0.74  CALCIUM 9.9 9.2   Liver Function Tests: No results found for this basename: AST, ALT, ALKPHOS, BILITOT, PROT, ALBUMIN,  in the last 168 hours No results found for this basename: LIPASE, AMYLASE,  in the last 168 hours No results found for this basename: AMMONIA,  in the last 168 hours CBC:  Recent Labs Lab 09/04/13 1835 09/05/13 0744  WBC 4.9 4.5  HGB 15.4* 14.1  HCT 45.2 41.8  MCV 87.3 88.2  PLT 173 163   Cardiac Enzymes:  Recent Labs Lab 09/04/13 2252 09/05/13 0744  TROPONINI <0.30 <0.30   BNP: No components found with this basename: POCBNP,  CBG: No  results found for this basename: GLUCAP,  in the last 168 hours   Micro Results: No results found for this or any previous visit (from the past 240 hour(s)).  Studies/Results: Dg Chest 2 View  09/04/2013   CLINICAL DATA:  Chest and back pain  EXAM: CHEST  2 VIEW  COMPARISON:  07/13/2011  FINDINGS: The heart size and mediastinal contours are within normal limits. Both lungs are clear. The visualized skeletal structures are unremarkable.  IMPRESSION: No active cardiopulmonary disease.   Electronically Signed   By: Kerby Moors M.D.   On:  09/04/2013 19:58    Medications: Scheduled Meds: . aspirin EC  325 mg Oral Daily  . clopidogrel  75 mg Oral Daily  . enoxaparin (LOVENOX) injection  40 mg Subcutaneous Q24H  .  morphine injection  4 mg Intravenous Once  . nitroGLYCERIN  0.5 inch Topical 4 times per day  . potassium chloride SA  20 mEq Oral BID  . sodium chloride  3 mL Intravenous Q12H      LOS: 1 day   Serria Sloma M.D. [facility] 09/05/2013, 9:53 AM Pager: [phone]  If 7PM-7AM, please contact night-coverage [email or web address] Password [email or web address]

## 2013-10-07 ENCOUNTER — Other Ambulatory Visit: Payer: Self-pay | Admitting: Pulmonary Disease

## 2013-10-15 ENCOUNTER — Telehealth: Payer: Self-pay | Admitting: Pulmonary Disease

## 2013-10-15 NOTE — Telephone Encounter (Signed)
lmtcb x1 All 3 rx's were sent to [facility] in 06/2013 with 3 additional refills. Pt should contact pharmacy for refills.

## 2013-10-16 NOTE — Telephone Encounter (Signed)
I spoke with patient-she is aware that she should have refills on [facility]. Pt states she called for her refills and was told that they did not have refills on file. I called [facility] and spoke with Judeen Hammans who stated patient did have refills and they are shipping to her home address on file. Pt is aware that this could take up to 8 days to process and mail to her. Nothing more needed at this time.

## 2013-11-27 ENCOUNTER — Other Ambulatory Visit: Payer: Self-pay | Admitting: Otolaryngology

## 2013-11-27 DIAGNOSIS — H0520 Unspecified exophthalmos: Secondary | ICD-10-CM

## 2013-11-29 ENCOUNTER — Ambulatory Visit
Admission: RE | Admit: 2013-11-29 | Discharge: 2013-11-29 | Disposition: A | Discharge status: Home / Self Care | Payer: Medicare Other | Source: Ambulatory Visit | Attending: Otolaryngology | Admitting: Otolaryngology

## 2013-11-29 DIAGNOSIS — H0520 Unspecified exophthalmos: Secondary | ICD-10-CM

## 2013-12-03 ENCOUNTER — Other Ambulatory Visit: Payer: Self-pay | Admitting: Ophthalmology

## 2013-12-03 DIAGNOSIS — H0520 Unspecified exophthalmos: Secondary | ICD-10-CM

## 2013-12-05 ENCOUNTER — Ambulatory Visit
Admission: RE | Admit: 2013-12-05 | Discharge: 2013-12-05 | Disposition: A | Discharge status: Home / Self Care | Payer: Medicare Other | Source: Ambulatory Visit | Attending: Ophthalmology | Admitting: Ophthalmology

## 2013-12-05 DIAGNOSIS — H0520 Unspecified exophthalmos: Secondary | ICD-10-CM

## 2013-12-05 MED ORDER — GADOBENATE DIMEGLUMINE 529 MG/ML IV SOLN
16.0000 mL | Freq: Once | INTRAVENOUS | Status: AC | PRN
  Administered 2013-12-05: 16 mL via INTRAVENOUS

## 2013-12-09 ENCOUNTER — Telehealth: Payer: Self-pay | Admitting: Pulmonary Disease

## 2013-12-09 NOTE — Telephone Encounter (Signed)
Spoke with the pt and notified last TSH was checked on 07/10/13 and it was WNL She verbalized understanding and nothing further needed

## 2013-12-12 ENCOUNTER — Other Ambulatory Visit: Payer: Medicare Other

## 2014-01-06 ENCOUNTER — Telehealth: Payer: Self-pay | Admitting: Pulmonary Disease

## 2014-01-06 NOTE — Telephone Encounter (Signed)
Pt has upcoming appt with Dr Ronnald Ramp - New PCP - 01/13/14 Cancelled appt with SN per pt request. Nothing further needed.

## 2014-01-07 ENCOUNTER — Ambulatory Visit: Payer: Medicare Other | Admitting: Internal Medicine

## 2014-01-07 ENCOUNTER — Ambulatory Visit: Payer: Medicare Other | Admitting: Pulmonary Disease

## 2014-01-13 ENCOUNTER — Other Ambulatory Visit (INDEPENDENT_AMBULATORY_CARE_PROVIDER_SITE_OTHER): Payer: Medicare Other

## 2014-01-13 ENCOUNTER — Other Ambulatory Visit: Payer: Self-pay | Admitting: Internal Medicine

## 2014-01-13 ENCOUNTER — Ambulatory Visit (INDEPENDENT_AMBULATORY_CARE_PROVIDER_SITE_OTHER): Payer: Medicare Other | Admitting: Internal Medicine

## 2014-01-13 ENCOUNTER — Encounter: Payer: Self-pay | Admitting: Internal Medicine

## 2014-01-13 VITALS — BP 110/70 | HR 74 | Temp 98.1°F | Resp 16 | Ht 63.0 in | Wt 177.0 lb

## 2014-01-13 DIAGNOSIS — M899 Disorder of bone, unspecified: Secondary | ICD-10-CM

## 2014-01-13 DIAGNOSIS — I2510 Atherosclerotic heart disease of native coronary artery without angina pectoris: Secondary | ICD-10-CM

## 2014-01-13 DIAGNOSIS — D519 Vitamin B12 deficiency anemia, unspecified: Secondary | ICD-10-CM

## 2014-01-13 DIAGNOSIS — E559 Vitamin D deficiency, unspecified: Secondary | ICD-10-CM

## 2014-01-13 DIAGNOSIS — I2584 Coronary atherosclerosis due to calcified coronary lesion: Secondary | ICD-10-CM

## 2014-01-13 DIAGNOSIS — E785 Hyperlipidemia, unspecified: Secondary | ICD-10-CM

## 2014-01-13 DIAGNOSIS — F411 Generalized anxiety disorder: Secondary | ICD-10-CM

## 2014-01-13 DIAGNOSIS — M949 Disorder of cartilage, unspecified: Secondary | ICD-10-CM

## 2014-01-13 DIAGNOSIS — E669 Obesity, unspecified: Secondary | ICD-10-CM

## 2014-01-13 DIAGNOSIS — K7689 Other specified diseases of liver: Secondary | ICD-10-CM

## 2014-01-13 DIAGNOSIS — R197 Diarrhea, unspecified: Secondary | ICD-10-CM | POA: Insufficient documentation

## 2014-01-13 DIAGNOSIS — I10 Essential (primary) hypertension: Secondary | ICD-10-CM

## 2014-01-13 DIAGNOSIS — D518 Other vitamin B12 deficiency anemias: Secondary | ICD-10-CM

## 2014-01-13 LAB — CBC WITH DIFFERENTIAL/PLATELET
Basophils Absolute: 0 10*3/uL (ref 0.0–0.1)
Basophils Relative: 0.2 % (ref 0.0–3.0)
EOS ABS: 0.1 10*3/uL (ref 0.0–0.7)
Eosinophils Relative: 1.5 % (ref 0.0–5.0)
HEMATOCRIT: 42.3 % (ref 36.0–46.0)
HEMOGLOBIN: 14.4 g/dL (ref 12.0–15.0)
LYMPHS PCT: 15.9 % (ref 12.0–46.0)
Lymphs Abs: 0.7 10*3/uL (ref 0.7–4.0)
MCHC: 34.1 g/dL (ref 30.0–36.0)
MCV: 89.6 fl (ref 78.0–100.0)
Monocytes Absolute: 0.4 10*3/uL (ref 0.1–1.0)
Monocytes Relative: 8.7 % (ref 3.0–12.0)
NEUTROS ABS: 3.3 10*3/uL (ref 1.4–7.7)
Neutrophils Relative %: 73.7 % (ref 43.0–77.0)
Platelets: 156 10*3/uL (ref 150.0–400.0)
RBC: 4.73 Mil/uL (ref 3.87–5.11)
RDW: 14.9 % (ref 11.5–15.5)
WBC: 4.4 10*3/uL (ref 4.0–10.5)

## 2014-01-13 LAB — COMPREHENSIVE METABOLIC PANEL
ALT: 16 U/L (ref 0–35)
AST: 22 U/L (ref 0–37)
Albumin: 4 g/dL (ref 3.5–5.2)
Alkaline Phosphatase: 123 U/L — ABNORMAL HIGH (ref 39–117)
BUN: 16 mg/dL (ref 6–23)
CALCIUM: 9.2 mg/dL (ref 8.4–10.5)
CHLORIDE: 105 meq/L (ref 96–112)
CO2: 28 meq/L (ref 19–32)
Creatinine, Ser: 0.8 mg/dL (ref 0.4–1.2)
GFR: 76.96 mL/min (ref >60.00)
Glucose, Bld: 99 mg/dL (ref 70–99)
Potassium: 3.8 mEq/L (ref 3.5–5.1)
Sodium: 140 mEq/L (ref 135–145)
Total Bilirubin: 1.3 mg/dL — ABNORMAL HIGH (ref 0.2–1.2)
Total Protein: 6.9 g/dL (ref 6.0–8.3)

## 2014-01-13 LAB — VITAMIN B12: VITAMIN B 12: 92 pg/mL — AB (ref 211–911)

## 2014-01-13 LAB — VITAMIN D 25 HYDROXY (VIT D DEFICIENCY, FRACTURES): VITD: 41.68 ng/mL (ref 30.00–100.00)

## 2014-01-13 LAB — SEDIMENTATION RATE: Sed Rate: 17 mm/hr (ref 0–22)

## 2014-01-13 MED ORDER — CLOPIDOGREL BISULFATE 75 MG PO TABS: 75.0000 mg | ORAL_TABLET | Freq: Every day | ORAL | Status: DC

## 2014-01-13 MED ORDER — METRONIDAZOLE 500 MG PO TABS: 500.0000 mg | ORAL_TABLET | Freq: Three times a day (TID) | ORAL | Status: DC

## 2014-01-13 MED ORDER — FUROSEMIDE 20 MG PO TABS: 20.0000 mg | ORAL_TABLET | Freq: Every day | ORAL | Status: DC

## 2014-01-13 MED ORDER — VITAMIN D (ERGOCALCIFEROL) 1.25 MG (50000 UNIT) PO CAPS: 50000.0000 [IU] | ORAL_CAPSULE | ORAL | Status: DC

## 2014-01-13 MED ORDER — NITROGLYCERIN 0.4 MG SL SUBL: 0.4000 mg | SUBLINGUAL_TABLET | SUBLINGUAL | Status: DC | PRN

## 2014-01-13 MED ORDER — PROMETHAZINE HCL 12.5 MG PO TABS: 12.5000 mg | ORAL_TABLET | Freq: Three times a day (TID) | ORAL | Status: DC | PRN

## 2014-01-13 MED ORDER — POTASSIUM CHLORIDE CRYS ER 20 MEQ PO TBCR: 20.0000 meq | EXTENDED_RELEASE_TABLET | Freq: Two times a day (BID) | ORAL | Status: DC

## 2014-01-13 MED ORDER — ALPRAZOLAM 0.5 MG PO TABS: 0.5000 mg | ORAL_TABLET | Freq: Three times a day (TID) | ORAL | Status: DC | PRN

## 2014-01-13 NOTE — Progress Notes (Signed)
Pre visit review using our clinic review tool, if applicable. No additional management support is needed unless otherwise documented below in the visit note. 

## 2014-01-13 NOTE — Progress Notes (Signed)
Subjective:    Patient ID: [name], female    DOB: November 28, 1940, 73 y.o.   MRN: [id number]  Diarrhea  This is a new problem. The current episode started in the past 7 days. The problem occurs more than 10 times per day. The problem has been gradually worsening. The stool consistency is described as mucous. The patient states that diarrhea does not awaken her from sleep. Associated symptoms include vomiting. Pertinent negatives include no abdominal pain, arthralgias, bloating, chills, coughing, fever, headaches, increased  flatus, myalgias, sweats, URI or weight loss. Risk factors include recent antibiotic use. She has tried nothing for the symptoms. The treatment provided no relief.      Review of Systems  Constitutional: Negative.  Negative for fever, chills, weight loss, diaphoresis, activity change, appetite change, fatigue and unexpected weight change.  HENT: Negative.   Eyes: Negative.   Respiratory: Negative.  Negative for cough, choking, chest tightness, shortness of breath, wheezing and stridor.   Cardiovascular: Negative.  Negative for chest pain, palpitations and leg swelling.  Gastrointestinal: Positive for nausea, vomiting and diarrhea. Negative for abdominal pain, constipation, blood in stool, abdominal distention, anal bleeding, rectal pain, bloating and flatus.  Endocrine: Negative.   Genitourinary: Negative.   Musculoskeletal: Negative.  Negative for arthralgias, back pain, joint swelling, myalgias and neck stiffness.  Skin: Negative.  Negative for rash.  Allergic/Immunologic: Negative.   Neurological: Negative.  Negative for dizziness, tremors, syncope, weakness, light-headedness, numbness and headaches.  Hematological: Negative.  Negative for adenopathy. Does not bruise/bleed easily.  Psychiatric/Behavioral: Negative.        Objective:   Physical Exam  Vitals reviewed. Constitutional: She is oriented to person, place, and time. She appears well-developed and  well-nourished.  Non-toxic appearance. She does not have a sickly appearance. She does not appear ill. No distress.  HENT:  Head: Normocephalic and atraumatic.  Mouth/Throat: Oropharynx is clear and moist. No oropharyngeal exudate.  Eyes: Conjunctivae are normal. Right eye exhibits no discharge. Left eye exhibits no discharge. No scleral icterus.  Neck: Normal range of motion. Neck supple. No JVD present. No tracheal deviation present. No thyromegaly present.  Cardiovascular: Normal rate, regular rhythm, normal heart sounds and intact distal pulses.  Exam reveals no gallop and no friction rub.   No murmur heard. Pulmonary/Chest: Effort normal and breath sounds normal. No stridor. No respiratory distress. She has no wheezes. She has no rales. She exhibits no tenderness.  Abdominal: Soft. Normal appearance. She exhibits no shifting dullness, no distension, no pulsatile liver, no fluid wave, no abdominal bruit, no ascites, no pulsatile midline mass and no mass. Bowel sounds are increased. There is no hepatosplenomegaly, splenomegaly or hepatomegaly. There is no tenderness. There is no rigidity, no rebound, no guarding, no CVA tenderness, no tenderness at McBurney's point and negative Murphy's sign. No hernia. Hernia confirmed negative in the ventral area.  Musculoskeletal: Normal range of motion. She exhibits no edema and no tenderness.  Lymphadenopathy:    She has no cervical adenopathy.  Neurological: She is oriented to person, place, and time.  Skin: Skin is warm and dry. No rash noted. She is not diaphoretic. No erythema. No pallor.  Psychiatric: She has a normal mood and affect. Her behavior is normal. Judgment and thought content normal.     Lab Results  Component Value Date   WBC 4.5 09/05/2013   HGB 14.1 09/05/2013   HCT 41.8 09/05/2013   PLT 163 09/05/2013   GLUCOSE 101* 09/05/2013   CHOL 213*  09/05/2013   TRIG 73 09/05/2013   HDL 48 09/05/2013   LDLDIRECT 172.9 07/10/2013   LDLCALC 150*  09/05/2013   ALT 24 07/10/2013   AST 30 07/10/2013   NA 140 09/05/2013   K 4.5 09/05/2013   CL 101 09/05/2013   CREATININE 0.74 09/05/2013   BUN 12 09/05/2013   CO2 26 09/05/2013   TSH 3.36 07/10/2013   INR 1.0 RATIO 04/28/2008   HGBA1C 5.1 07/06/2012       Assessment & Plan:

## 2014-01-13 NOTE — Patient Instructions (Signed)

## 2014-01-14 ENCOUNTER — Ambulatory Visit (INDEPENDENT_AMBULATORY_CARE_PROVIDER_SITE_OTHER): Payer: Medicare Other | Admitting: *Deleted

## 2014-01-14 DIAGNOSIS — D519 Vitamin B12 deficiency anemia, unspecified: Secondary | ICD-10-CM

## 2014-01-14 DIAGNOSIS — D518 Other vitamin B12 deficiency anemias: Secondary | ICD-10-CM

## 2014-01-14 LAB — FECAL LACTOFERRIN, QUANT: LACTOFERRIN: POSITIVE

## 2014-01-14 LAB — C. DIFFICILE GDH AND TOXIN A/B
C. difficile GDH: NOT DETECTED
C. difficile Toxin A/B: NOT DETECTED

## 2014-01-14 LAB — FECAL OCCULT BLOOD, IMMUNOCHEMICAL: Fecal Occult Bld: POSITIVE — AB

## 2014-01-14 MED ORDER — CYANOCOBALAMIN 1000 MCG/ML IJ SOLN
1000.0000 ug | Freq: Once | INTRAMUSCULAR | Status: AC
  Administered 2014-01-14: 1000 ug via INTRAMUSCULAR

## 2014-01-14 NOTE — Assessment & Plan Note (Signed)
B12 level is low, will start B12 injections

## 2014-01-14 NOTE — Assessment & Plan Note (Signed)
Her BP is well controlled 

## 2014-01-14 NOTE — Assessment & Plan Note (Signed)
I am concerned that she may have C diff infection so have started flagyl, will also try phenergan for symptom relief Will check her CBC and CMP to look for signs of systemic illness and will check stool for C diff infection

## 2014-01-14 NOTE — Assessment & Plan Note (Signed)
Cont vit D supplement

## 2014-01-16 ENCOUNTER — Encounter: Payer: Self-pay | Admitting: Internal Medicine

## 2014-01-17 ENCOUNTER — Telehealth: Payer: Self-pay | Admitting: *Deleted

## 2014-01-17 MED ORDER — LOPERAMIDE HCL 2 MG PO TABS: 2.0000 mg | ORAL_TABLET | Freq: Four times a day (QID) | ORAL | Status: DC | PRN

## 2014-01-17 NOTE — Telephone Encounter (Signed)
Left msg on triage stating saw md on Monday she is still having (R) side pain onlong with diarrhea. Requesting advisement from md.../lmb

## 2014-01-17 NOTE — Telephone Encounter (Signed)
Her labs looked good, a letter was sent There was some evidence of blood and infection in her stool Start ImodiumAD, RX was sent to her pharmacy

## 2014-01-17 NOTE — Telephone Encounter (Signed)
Notified pt with md response. Pt states she has appt on Monday to see you. Wanting to know can md rx something for diarrhea & also want results back from labs that was done...Johny Chess

## 2014-01-17 NOTE — Telephone Encounter (Signed)
She did not have side pain when I saw her, she needs to be rechecked as soon as possible

## 2014-01-17 NOTE — Telephone Encounter (Signed)
Notified pt with md response.../lmb 

## 2014-01-20 ENCOUNTER — Encounter: Payer: Self-pay | Admitting: Internal Medicine

## 2014-01-20 ENCOUNTER — Ambulatory Visit (INDEPENDENT_AMBULATORY_CARE_PROVIDER_SITE_OTHER): Payer: Medicare Other | Admitting: Internal Medicine

## 2014-01-20 VITALS — BP 124/78 | HR 76 | Temp 97.7°F | Resp 16 | Ht 63.0 in | Wt 180.0 lb

## 2014-01-20 DIAGNOSIS — D519 Vitamin B12 deficiency anemia, unspecified: Secondary | ICD-10-CM

## 2014-01-20 DIAGNOSIS — K589 Irritable bowel syndrome without diarrhea: Secondary | ICD-10-CM | POA: Insufficient documentation

## 2014-01-20 DIAGNOSIS — R197 Diarrhea, unspecified: Secondary | ICD-10-CM

## 2014-01-20 DIAGNOSIS — D518 Other vitamin B12 deficiency anemias: Secondary | ICD-10-CM

## 2014-01-20 MED ORDER — CYANOCOBALAMIN 1000 MCG/ML IJ SOLN
1000.0000 ug | Freq: Once | INTRAMUSCULAR | Status: AC
  Administered 2014-01-20: 1000 ug via INTRAMUSCULAR

## 2014-01-20 MED ORDER — GLYCOPYRROLATE 2 MG PO TABS: 2.0000 mg | ORAL_TABLET | Freq: Three times a day (TID) | ORAL | Status: DC

## 2014-01-20 NOTE — Assessment & Plan Note (Signed)
She will cont receiving B12 injections

## 2014-01-20 NOTE — Progress Notes (Signed)
Pre visit review using our clinic review tool, if applicable. No additional management support is needed unless otherwise documented below in the visit note. 

## 2014-01-20 NOTE — Patient Instructions (Signed)
Irritable Bowel Syndrome Irritable bowel syndrome (IBS) is caused by a disturbance of normal bowel function and is a common digestive disorder. You may also hear this condition called spastic colon, mucous colitis, and irritable colon. There is no cure for IBS. However, symptoms often gradually improve or disappear with a good diet, stress management, and medicine. This condition usually appears in late adolescence or early adulthood. Women develop it twice as often as men. CAUSES  After food has been digested and absorbed in the small intestine, waste material is moved into the large intestine, or colon. In the colon, water and salts are absorbed from the undigested products coming from the small intestine. The remaining residue, or fecal material, is held for elimination. Under normal circumstances, gentle, rhythmic contractions of the bowel walls push the fecal material along the colon toward the rectum. In IBS, however, these contractions are irregular and poorly coordinated. The fecal material is either retained too long, resulting in constipation, or expelled too soon, producing diarrhea. SIGNS AND SYMPTOMS  The most common symptom of IBS is abdominal pain. It is often in the lower left side of the abdomen, but it may occur anywhere in the abdomen. The pain comes from spasms of the bowel muscles happening too much and from the buildup of gas and fecal material in the colon. This pain:  Can range from sharp abdominal cramps to a dull, continuous ache.  Often worsens soon after eating.  Is often relieved by having a bowel movement or passing gas. Abdominal pain is usually accompanied by constipation, but it may also produce diarrhea. The diarrhea often occurs right after a meal or upon waking up in the morning. The stools are often soft, watery, and flecked with mucus. Other symptoms of IBS include:  Bloating.  Loss of appetite.  Heartburn.  Backache.  Dull pain in the arms or  shoulders.  Nausea.  Burping.  Vomiting.  Gas. IBS may also cause symptoms that are unrelated to the digestive system, such as:  Fatigue.  Headaches.  Anxiety.  Shortness of breath.  Trouble concentrating.  Dizziness. These symptoms tend to come and go. DIAGNOSIS  The symptoms of IBS may seem like symptoms of other, more serious digestive disorders. Your health care provider may want to perform tests to exclude these disorders.  TREATMENT Many medicines are available to help correct bowel function or relieve bowel spasms and abdominal pain. Among the medicines available are:  Laxatives for severe constipation and to help restore normal bowel habits.  Specific antidiarrheal medicines to treat severe or lasting diarrhea.  Antispasmodic agents to relieve intestinal cramps. Your health care provider may also decide to treat you with a mild tranquilizer or sedative during unusually stressful periods in your life. Your health care provider may also prescribe antidepressant medicine. The use of this medicine has been shown to reduce pain and other symptoms of IBS. Remember that if any medicine is prescribed for you, you should take it exactly as directed. Make sure your health care provider knows how well it worked for you. HOME CARE INSTRUCTIONS   Take all medicines as directed by your health care provider.  Avoid foods that are high in fat or oils, such as heavy cream, butter, frankfurters, sausage, and other fatty meats.  Avoid foods that make you go to the bathroom, such as fruit, fruit juice, and dairy products.  Cut out carbonated drinks, chewing gum, and "gassy" foods such as beans and cabbage. This may help relieve bloating and burping.    Eat foods with bran, and drink plenty of liquids with the bran foods. This helps relieve constipation.  Keep track of what foods seem to bring on your symptoms.  Avoid emotionally charged situations or circumstances that produce  anxiety.  Start or continue exercising.  Get plenty of rest and sleep. Document Released: 06/06/2005 Document Revised: 06/11/2013 Document Reviewed: 01/25/2008 [employer] Patient Information 2015 [employer]. This information is not intended to replace advice given to you by your health care provider. Make sure you discuss any questions you have with your health care provider.  

## 2014-01-20 NOTE — Progress Notes (Signed)
Subjective:    Patient ID: [name], female    DOB: 04/05/1941, 73 y.o.   MRN: [id number]  Abdominal Pain This is a recurrent problem. The current episode started more than 1 year ago. The onset quality is gradual. The problem occurs intermittently. The problem has been unchanged. The pain is located in the RLQ. The pain is at a severity of 1/10. The pain is mild. Quality: Feels like a "ping", feels hot  The abdominal pain does not radiate. Associated symptoms include diarrhea and flatus. Pertinent negatives include no anorexia, arthralgias, belching, constipation, dysuria, fever, frequency, headaches, hematochezia, hematuria, melena, myalgias, nausea, vomiting or weight loss. Nothing aggravates the pain. The pain is relieved by nothing. She has tried nothing for the symptoms. The treatment provided no relief. Prior diagnostic workup includes CT scan, GI consult and lower endoscopy. Her past medical history is significant for irritable bowel syndrome. There is no history of abdominal surgery, colon cancer, Crohn's disease, gallstones, GERD, pancreatitis, PUD or ulcerative colitis.      Review of Systems  Constitutional: Negative.  Negative for fever, chills, weight loss, diaphoresis, activity change, appetite change, fatigue and unexpected weight change.  HENT: Negative.  Negative for trouble swallowing.   Eyes: Negative.   Respiratory: Negative.  Negative for cough, choking, chest tightness, shortness of breath, wheezing and stridor.   Cardiovascular: Negative.  Negative for chest pain, palpitations and leg swelling.  Gastrointestinal: Positive for abdominal pain, diarrhea and flatus. Negative for nausea, vomiting, constipation, blood in stool, melena, hematochezia, abdominal distention, anal bleeding, rectal pain and anorexia.  Endocrine: Negative.   Genitourinary: Negative.  Negative for dysuria, frequency and hematuria.  Musculoskeletal: Negative.  Negative for arthralgias and  myalgias.  Skin: Negative.   Allergic/Immunologic: Negative.   Neurological: Negative.  Negative for dizziness, tremors, weakness, light-headedness, numbness and headaches.  Hematological: Negative.  Negative for adenopathy. Does not bruise/bleed easily.  Psychiatric/Behavioral: Negative.        Objective:   Physical Exam  Vitals reviewed. Constitutional: She is oriented to person, place, and time. She appears well-developed and well-nourished.  Non-toxic appearance. She does not have a sickly appearance. She does not appear ill. No distress.  HENT:  Head: Normocephalic and atraumatic.  Mouth/Throat: Oropharynx is clear and moist. No oropharyngeal exudate.  Eyes: Conjunctivae are normal. Right eye exhibits no discharge. Left eye exhibits no discharge. No scleral icterus.  Neck: Normal range of motion. Neck supple. No JVD present. No tracheal deviation present. No thyromegaly present.  Cardiovascular: Normal rate, regular rhythm, normal heart sounds and intact distal pulses.  Exam reveals no gallop and no friction rub.   No murmur heard. Pulmonary/Chest: Effort normal and breath sounds normal. No stridor. No respiratory distress. She has no wheezes. She has no rales. She exhibits no tenderness.  Abdominal: Soft. Normal appearance and bowel sounds are normal. She exhibits no shifting dullness, no distension, no pulsatile liver, no fluid wave, no abdominal bruit, no ascites, no pulsatile midline mass and no mass. There is no hepatosplenomegaly, splenomegaly or hepatomegaly. There is no tenderness. There is no rigidity, no rebound, no guarding, no CVA tenderness, no tenderness at McBurney's point and negative Murphy's sign. No hernia. Hernia confirmed negative in the ventral area.  Musculoskeletal: Normal range of motion. She exhibits no edema and no tenderness.  Lymphadenopathy:    She has no cervical adenopathy.  Neurological: She is oriented to person, place, and time.  Skin: Skin is warm  and dry. No rash noted. She  is not diaphoretic. No erythema. No pallor.    Lab Results  Component Value Date   WBC 4.4 01/13/2014   HGB 14.4 01/13/2014   HCT 42.3 01/13/2014   PLT 156.0 01/13/2014   GLUCOSE 99 01/13/2014   CHOL 213* 09/05/2013   TRIG 73 09/05/2013   HDL 48 09/05/2013   LDLDIRECT 172.9 07/10/2013   LDLCALC 150* 09/05/2013   ALT 16 01/13/2014   AST 22 01/13/2014   NA 140 01/13/2014   K 3.8 01/13/2014   CL 105 01/13/2014   CREATININE 0.8 01/13/2014   BUN 16 01/13/2014   CO2 28 01/13/2014   TSH 3.36 07/10/2013   INR 1.0 RATIO 04/28/2008   HGBA1C 5.1 07/06/2012        Assessment & Plan:

## 2014-01-20 NOTE — Assessment & Plan Note (Signed)
Will treat with imodium and robinul

## 2014-01-20 NOTE — Assessment & Plan Note (Signed)
This is rapidly improving

## 2014-02-19 ENCOUNTER — Ambulatory Visit (INDEPENDENT_AMBULATORY_CARE_PROVIDER_SITE_OTHER): Payer: Medicare Other | Admitting: Internal Medicine

## 2014-02-19 ENCOUNTER — Other Ambulatory Visit (INDEPENDENT_AMBULATORY_CARE_PROVIDER_SITE_OTHER): Payer: Medicare Other

## 2014-02-19 ENCOUNTER — Encounter: Payer: Self-pay | Admitting: Internal Medicine

## 2014-02-19 VITALS — BP 120/80 | HR 65 | Temp 97.8°F | Resp 16 | Ht 63.0 in | Wt 180.0 lb

## 2014-02-19 DIAGNOSIS — D518 Other vitamin B12 deficiency anemias: Secondary | ICD-10-CM

## 2014-02-19 DIAGNOSIS — Z23 Encounter for immunization: Secondary | ICD-10-CM

## 2014-02-19 DIAGNOSIS — D519 Vitamin B12 deficiency anemia, unspecified: Secondary | ICD-10-CM

## 2014-02-19 DIAGNOSIS — R197 Diarrhea, unspecified: Secondary | ICD-10-CM

## 2014-02-19 DIAGNOSIS — E538 Deficiency of other specified B group vitamins: Secondary | ICD-10-CM

## 2014-02-19 DIAGNOSIS — K589 Irritable bowel syndrome without diarrhea: Secondary | ICD-10-CM

## 2014-02-19 LAB — CBC WITH DIFFERENTIAL/PLATELET
BASOS ABS: 0 10*3/uL (ref 0.0–0.1)
Basophils Relative: 0.3 % (ref 0.0–3.0)
EOS ABS: 0.1 10*3/uL (ref 0.0–0.7)
Eosinophils Relative: 1.2 % (ref 0.0–5.0)
HEMATOCRIT: 42.5 % (ref 36.0–46.0)
Hemoglobin: 14.3 g/dL (ref 12.0–15.0)
Lymphocytes Relative: 19.2 % (ref 12.0–46.0)
Lymphs Abs: 0.9 10*3/uL (ref 0.7–4.0)
MCHC: 33.7 g/dL (ref 30.0–36.0)
MCV: 89.2 fl (ref 78.0–100.0)
Monocytes Absolute: 0.3 10*3/uL (ref 0.1–1.0)
Monocytes Relative: 5.6 % (ref 3.0–12.0)
NEUTROS ABS: 3.6 10*3/uL (ref 1.4–7.7)
Neutrophils Relative %: 73.7 % (ref 43.0–77.0)
Platelets: 169 10*3/uL (ref 150.0–400.0)
RBC: 4.76 Mil/uL (ref 3.87–5.11)
RDW: 13.9 % (ref 11.5–15.5)
WBC: 4.9 10*3/uL (ref 4.0–10.5)

## 2014-02-19 LAB — SEDIMENTATION RATE: Sed Rate: 13 mm/hr (ref 0–22)

## 2014-02-19 MED ORDER — CYANOCOBALAMIN 1000 MCG/ML IJ SOLN
1000.0000 ug | Freq: Once | INTRAMUSCULAR | Status: AC
  Administered 2014-02-19: 1000 ug via INTRAMUSCULAR

## 2014-02-19 NOTE — Progress Notes (Signed)
Subjective:    Patient ID: [name], female    DOB: Aug 19, 1940, 73 y.o.   MRN: [id number]  Diarrhea  This is a recurrent problem. The current episode started more than 1 month ago. The problem occurs 2 to 4 times per day. The problem has been unchanged. The stool consistency is described as watery. The patient states that diarrhea does not awaken her from sleep. Pertinent negatives include no arthralgias, bloating, chills, coughing, fever, headaches, increased  flatus, myalgias, sweats, URI, vomiting or weight loss. Nothing aggravates the symptoms. Risk factors include recent antibiotic use. She has tried anti-motility drug for the symptoms. The treatment provided mild relief. Her past medical history is significant for irritable bowel syndrome. There is no history of bowel resection, inflammatory bowel disease, malabsorption, a recent abdominal surgery or short gut syndrome.      Review of Systems  Constitutional: Negative.  Negative for fever, chills, weight loss, diaphoresis, appetite change and fatigue.  HENT: Negative.   Eyes: Negative.   Respiratory: Negative.  Negative for cough, choking, chest tightness, shortness of breath and stridor.   Cardiovascular: Negative.  Negative for chest pain, palpitations and leg swelling.  Gastrointestinal: Positive for diarrhea. Negative for nausea, vomiting, constipation, blood in stool, anal bleeding, rectal pain, bloating and flatus.  Endocrine: Negative.   Genitourinary: Negative.   Musculoskeletal: Negative.  Negative for arthralgias, back pain, joint swelling, myalgias and neck pain.  Skin: Negative.  Negative for rash.  Allergic/Immunologic: Negative.   Neurological: Negative.  Negative for dizziness, tremors, weakness, light-headedness, numbness and headaches.  Hematological: Negative.  Negative for adenopathy. Does not bruise/bleed easily.  Psychiatric/Behavioral: Negative.        Objective:   Physical Exam  Vitals  reviewed. Constitutional: She is oriented to person, place, and time. She appears well-developed and well-nourished.  Non-toxic appearance. She does not have a sickly appearance. She does not appear ill. No distress.  HENT:  Head: Normocephalic and atraumatic.  Mouth/Throat: Oropharynx is clear and moist. No oropharyngeal exudate.  Eyes: Conjunctivae are normal. Right eye exhibits no discharge. Left eye exhibits no discharge. No scleral icterus.  Neck: Normal range of motion. Neck supple. No JVD present. No tracheal deviation present. No thyromegaly present.  Cardiovascular: Normal rate, regular rhythm, normal heart sounds and intact distal pulses.  Exam reveals no gallop and no friction rub.   No murmur heard. Pulmonary/Chest: Effort normal and breath sounds normal. No stridor. No respiratory distress. She has no wheezes. She has no rales. She exhibits no tenderness.  Abdominal: Soft. Bowel sounds are normal. She exhibits no distension and no mass. There is no tenderness. There is no rebound and no guarding.  Musculoskeletal: Normal range of motion. She exhibits no edema and no tenderness.  Lymphadenopathy:    She has no cervical adenopathy.  Neurological: She is oriented to person, place, and time.  Skin: Skin is warm and dry. No rash noted. She is not diaphoretic. No erythema. No pallor.  Psychiatric: She has a normal mood and affect. Her behavior is normal. Judgment and thought content normal.     Lab Results  Component Value Date   WBC 4.4 01/13/2014   HGB 14.4 01/13/2014   HCT 42.3 01/13/2014   PLT 156.0 01/13/2014   GLUCOSE 99 01/13/2014   CHOL 213* 09/05/2013   TRIG 73 09/05/2013   HDL 48 09/05/2013   LDLDIRECT 172.9 07/10/2013   LDLCALC 150* 09/05/2013   ALT 16 01/13/2014   AST 22 01/13/2014  NA 140 01/13/2014   K 3.8 01/13/2014   CL 105 01/13/2014   CREATININE 0.8 01/13/2014   BUN 16 01/13/2014   CO2 28 01/13/2014   TSH 3.36 07/10/2013   INR 1.0 RATIO 04/28/2008   HGBA1C 5.1  07/06/2012       Assessment & Plan:

## 2014-02-19 NOTE — Patient Instructions (Signed)
Chronic Diarrhea  Diarrhea is frequent loose and watery bowel movements. It can cause you to feel weak and dehydrated. Dehydration can cause you to become tired and thirsty and to have a dry mouth, decreased urination, and dark yellow urine. Diarrhea is a sign of another problem, most often an infection that will not last long. In most cases, diarrhea lasts 2-3 days. Diarrhea that lasts longer than 4 weeks is called long-lasting (chronic) diarrhea. It is important to treat your diarrhea as directed by your health care provider to lessen or prevent future episodes of diarrhea.   CAUSES   There are many causes of chronic diarrhea. The following are some possible causes:   · Gastrointestinal infections caused by viruses, bacteria, or parasites.    · Food poisoning or food allergies.    · Certain medicines, such as antibiotics, chemotherapy, and laxatives.    · Artificial sweeteners and fructose.    · Digestive disorders, such as celiac disease and inflammatory bowel diseases.    · Irritable bowel syndrome.  · Some disorders of the pancreas.  · Disorders of the thyroid.  · Reduced blood flow to the intestines.  · Cancer.  Sometimes the cause of chronic diarrhea is unknown.  RISK FACTORS  · Having a severely weakened immune system, such as from HIV or AIDS.    · Taking certain types of cancer-fighting drugs (such as with chemotherapy) or other medicines.    · Having had a recent organ transplant.    · Having a portion of the stomach or small bowel removed.    · Traveling to countries where food and water supplies are often contaminated.    SYMPTOMS   In addition to frequent, loose stools, diarrhea may cause:   · Cramping.    · Abdominal pain.    · Nausea.    · Fever.  · Fatigue.  · Urgent need to use the bathroom.  · Loss of bowel control.  DIAGNOSIS   Your health care provider must take a careful history and perform a physical exam. Tests given are based on your symptoms and history. Tests may include:   · Blood or  stool tests. Three or more stool samples may be examined. Stool cultures may be used to test for bacteria or parasites.    · X-rays.    · A procedure in which a thin tube is inserted into the mouth or rectum (endoscopy). This allows the health care provider to look inside the intestine.    TREATMENT   · Treatment is aimed at correcting the cause of the diarrhea when possible.  · Diarrhea caused by an infection can often be treated with antibiotic medicines.  · Diarrhea not caused by an infection may require you to take long-term medicine or have surgery. Specific treatment should be discussed with your health care provider.  · If the cause cannot be determined, treatment aims to relieve symptoms and prevent dehydration. Serious health problems can occur if you do not maintain proper fluid levels. Treatment may include:  ¨ Taking an oral rehydration solution (ORS).  ¨ Not drinking beverages that contain caffeine (such as tea, coffee, and soft drinks).  ¨ Not drinking alcohol.  ¨ Maintaining well-balanced nutrition to help you recover faster.  HOME CARE INSTRUCTIONS   · Drink enough fluids to keep urine clear or pale yellow. Drink 1 cup (8 oz) of fluid for each diarrhea episode. Avoid fluids that contain simple sugars, fruit juices, whole milk products, and sodas. Hydrate with an ORS. You may purchase the ORS or prepare it at home by mixing the   following ingredients together:  ¨  - tsp (1.7-3  mL) table salt.  ¨ ¾ tsp (3 ¾ mL) baking soda.  ¨  tsp (1.7 mL) salt substitute containing potassium chloride.  ¨ 1 tbsp (20 mL) sugar.  ¨ 4.2 c (1 L) of water.    · Certain foods and beverages may increase the speed at which food moves through the gastrointestinal (GI) tract. These foods and beverages should be avoided. They include:  ¨ Caffeinated and alcoholic beverages.  ¨ High-fiber foods, such as raw fruits and vegetables, nuts, seeds, and whole grain breads and cereals.  ¨ Foods and beverages sweetened with sugar  alcohols, such as xylitol, sorbitol, and mannitol.    · Some foods may be well tolerated and may help thicken stool. These include:  ¨ Starchy foods, such as rice, toast, pasta, low-sugar cereal, oatmeal, grits, baked potatoes, crackers, and bagels.  ¨ Bananas.  ¨ Applesauce.  · Add probiotic-rich foods to help increase healthy bacteria in the GI tract. These include yogurt and fermented milk products.  · Wash your hands well after each diarrhea episode.  · Only take over-the-counter or prescription medicines as directed by your health care provider.  · Take a warm bath to relieve any burning or pain from frequent diarrhea episodes.  SEEK MEDICAL CARE IF:   · You are not urinating as often.  · Your urine is a dark color.  · You become very tired or dizzy.  · You have severe pain in the abdomen or rectum.  · Your have blood or pus in your stools.  · Your stools look black and tarry.  SEEK IMMEDIATE MEDICAL CARE IF:   · You are unable to keep fluids down.  · You have persistent vomiting.  · You have blood in your stool.  · Your stools are black and tarry.  · You do not urinate in 6-8 hours, or there is only a small amount of very dark urine.  · You have abdominal pain that increases or localizes.  · You have weakness, dizziness, confusion, or lightheadedness.  · You have a severe headache.  · Your diarrhea gets worse or does not get better.  · You have a fever or persistent symptoms for more than 2-3 days.  · You have a fever and your symptoms suddenly get worse.  MAKE SURE YOU:   · Understand these instructions.  · Will watch your condition.  · Will get help right away if you are not doing well or get worse.  Document Released: 08/27/2003 Document Revised: 06/11/2013 Document Reviewed: 11/29/2012  ExitCare® Patient Information ©2015 [employer]. This information is not intended to replace advice given to you by your health care provider. Make sure you discuss any questions you have with your health care  provider.

## 2014-02-19 NOTE — Progress Notes (Signed)
Pre visit review using our clinic review tool, if applicable. No additional management support is needed unless otherwise documented below in the visit note. 

## 2014-02-20 LAB — TISSUE TRANSGLUTAMINASE, IGA: Tissue Transglutaminase Ab, IgA: 4 U/mL (ref <20)

## 2014-02-20 LAB — GLIADIN ANTIBODIES, SERUM
Gliadin IgA: 2.7 U/mL (ref <20)
Gliadin IgG: 7.8 U/mL (ref <20)

## 2014-02-20 NOTE — Assessment & Plan Note (Signed)
Use imodium as nseded

## 2014-02-20 NOTE — Assessment & Plan Note (Signed)
Will screen for celiac disease

## 2014-02-20 NOTE — Assessment & Plan Note (Signed)
Her diarrhea persists though it is some better I will check her stool for infection, a CBC to look for evidence of C. Diff infection , and a celiac panel She likely has IBS-D so I have encouraged her to use an anti-motility agent as needed If this does not resolve soon will send to GI to consider testing for collagenous colitis

## 2014-02-21 ENCOUNTER — Other Ambulatory Visit: Payer: Medicare Other

## 2014-02-21 ENCOUNTER — Other Ambulatory Visit: Payer: Self-pay | Admitting: Internal Medicine

## 2014-02-21 DIAGNOSIS — R197 Diarrhea, unspecified: Secondary | ICD-10-CM

## 2014-02-21 LAB — RETICULIN ANTIBODIES, IGA W TITER: Reticulin Ab, IgA: NEGATIVE

## 2014-02-22 LAB — FECAL LACTOFERRIN, QUANT: Lactoferrin: NEGATIVE

## 2014-02-22 LAB — C. DIFFICILE GDH AND TOXIN A/B
C. DIFFICILE GDH: NOT DETECTED
C. difficile Toxin A/B: NOT DETECTED

## 2014-02-25 LAB — GIARDIA/CRYPTOSPORIDIUM (EIA)
CRYPTOSPORIDIUM SCREEN (EIA) (SOL): NEGATIVE
Giardia Screen (EIA): NEGATIVE

## 2014-03-26 ENCOUNTER — Ambulatory Visit: Payer: Medicare Other | Admitting: Internal Medicine

## 2014-03-26 DIAGNOSIS — Z0289 Encounter for other administrative examinations: Secondary | ICD-10-CM

## 2014-04-21 ENCOUNTER — Encounter: Payer: Self-pay | Admitting: Internal Medicine

## 2014-04-21 ENCOUNTER — Ambulatory Visit (INDEPENDENT_AMBULATORY_CARE_PROVIDER_SITE_OTHER): Payer: Medicare Other | Admitting: Internal Medicine

## 2014-04-21 VITALS — BP 150/84 | HR 66 | Temp 97.7°F | Resp 12 | Ht 62.0 in | Wt 178.2 lb

## 2014-04-21 DIAGNOSIS — J069 Acute upper respiratory infection, unspecified: Secondary | ICD-10-CM

## 2014-04-21 MED ORDER — METHYLPREDNISOLONE 4 MG PO KIT: PACK | ORAL | Status: DC

## 2014-04-21 MED ORDER — PROMETHAZINE-DM 6.25-15 MG/5ML PO SYRP: 5.0000 mL | ORAL_SOLUTION | Freq: Four times a day (QID) | ORAL | Status: DC | PRN

## 2014-04-21 NOTE — Progress Notes (Signed)
Subjective:    Patient ID: [name], female    DOB: May 12, 1941, 73 y.o.   MRN: [id number]  Cough This is a new problem. Episode onset: for 2-3 days. The problem has been unchanged. The problem occurs every few hours. The cough is non-productive. Associated symptoms include nasal congestion, postnasal drip and a sore throat. Pertinent negatives include no chest pain, chills, ear congestion, ear pain, fever, headaches, heartburn, hemoptysis, myalgias, rash, rhinorrhea, shortness of breath, sweats, weight loss or wheezing. She has tried nothing for the symptoms. The treatment provided no relief. There is no history of asthma, bronchiectasis, bronchitis, COPD, emphysema, environmental allergies or pneumonia.      Review of Systems  Constitutional: Negative.  Negative for fever, chills, weight loss, diaphoresis, appetite change and fatigue.  HENT: Positive for postnasal drip, sore throat and voice change. Negative for ear pain, rhinorrhea, sinus pressure and trouble swallowing.   Eyes: Negative.   Respiratory: Positive for cough. Negative for apnea, hemoptysis, choking, chest tightness, shortness of breath, wheezing and stridor.   Cardiovascular: Negative.  Negative for chest pain, palpitations and leg swelling.  Gastrointestinal: Negative.  Negative for heartburn and abdominal pain.  Endocrine: Negative.   Genitourinary: Negative.   Musculoskeletal: Negative.  Negative for myalgias.  Skin: Negative.  Negative for rash.  Allergic/Immunologic: Negative.  Negative for environmental allergies.  Neurological: Negative.  Negative for headaches.  Hematological: Negative.  Negative for adenopathy. Does not bruise/bleed easily.  Psychiatric/Behavioral: Negative.        Objective:   Physical Exam  Constitutional: She is oriented to person, place, and time. She appears well-developed and well-nourished.  Non-toxic appearance. She does not have a sickly appearance. She does not appear ill. No  distress.  She has mild laryngitis  HENT:  Head: Normocephalic and atraumatic.  Mouth/Throat: Oropharynx is clear and moist. No oropharyngeal exudate.  Eyes: Conjunctivae are normal. Right eye exhibits no discharge. Left eye exhibits no discharge. No scleral icterus.  Neck: Normal range of motion. Neck supple. No JVD present. No tracheal deviation present. No thyromegaly present.  Cardiovascular: Normal rate, regular rhythm and intact distal pulses.  Exam reveals no gallop and no friction rub.   No murmur heard. Pulmonary/Chest: Effort normal and breath sounds normal. No stridor. No respiratory distress. She has no wheezes. She has no rales. She exhibits no tenderness.  Abdominal: Soft. Bowel sounds are normal. She exhibits no distension and no mass. There is no tenderness. There is no rebound and no guarding.  Musculoskeletal: Normal range of motion. She exhibits no edema or tenderness.  Lymphadenopathy:    She has no cervical adenopathy.  Neurological: She is oriented to person, place, and time.  Skin: Skin is warm and dry. No rash noted. She is not diaphoretic. No erythema. No pallor.  Vitals reviewed.    Lab Results  Component Value Date   WBC 4.9 02/19/2014   HGB 14.3 02/19/2014   HCT 42.5 02/19/2014   PLT 169.0 02/19/2014   GLUCOSE 99 01/13/2014   CHOL 213* 09/05/2013   TRIG 73 09/05/2013   HDL 48 09/05/2013   LDLDIRECT 172.9 07/10/2013   LDLCALC 150* 09/05/2013   ALT 16 01/13/2014   AST 22 01/13/2014   NA 140 01/13/2014   K 3.8 01/13/2014   CL 105 01/13/2014   CREATININE 0.8 01/13/2014   BUN 16 01/13/2014   CO2 28 01/13/2014   TSH 3.36 07/10/2013   INR 1.0 RATIO 04/28/2008   HGBA1C 5.1 07/06/2012  Assessment & Plan:

## 2014-04-21 NOTE — Progress Notes (Signed)
Pre visit review using our clinic review tool, if applicable. No additional management support is needed unless otherwise documented below in the visit note. 

## 2014-04-21 NOTE — Patient Instructions (Signed)
Upper Respiratory Infection, Adult An upper respiratory infection (URI) is also known as the common cold. It is often caused by a type of germ (virus). Colds are easily spread (contagious). You can pass it to others by kissing, coughing, sneezing, or drinking out of the same glass. Usually, you get better in 1 or 2 weeks.  HOME CARE   Only take medicine as told by your doctor.  Use a warm mist humidifier or breathe in steam from a hot shower.  Drink enough water and fluids to keep your pee (urine) clear or pale yellow.  Get plenty of rest.  Return to work when your temperature is back to normal or as told by your doctor. You may use a face mask and wash your hands to stop your cold from spreading. GET HELP RIGHT AWAY IF:   After the first few days, you feel you are getting worse.  You have questions about your medicine.  You have chills, shortness of breath, or brown or red spit (mucus).  You have yellow or brown snot (nasal discharge) or pain in the face, especially when you bend forward.  You have a fever, puffy (swollen) neck, pain when you swallow, or white spots in the back of your throat.  You have a bad headache, ear pain, sinus pain, or chest pain.  You have a high-pitched whistling sound when you breathe in and out (wheezing).  You have a lasting cough or cough up blood.  You have sore muscles or a stiff neck. MAKE SURE YOU:   Understand these instructions.  Will watch your condition.  Will get help right away if you are not doing well or get worse. Document Released: 11/23/2007 Document Revised: 08/29/2011 Document Reviewed: 09/11/2013 ExitCare Patient Information 2015 [employer]. This information is not intended to replace advice given to you by your health care provider. Make sure you discuss any questions you have with your health care provider.  

## 2014-04-21 NOTE — Assessment & Plan Note (Signed)
Will try a medrol dose pak for the laryngitis She will take phenergan-dm for the cough This is viral so antibiotics are not indicated

## 2014-04-23 ENCOUNTER — Telehealth: Payer: Self-pay | Admitting: Internal Medicine

## 2014-04-23 MED ORDER — AZITHROMYCIN 500 MG PO TABS: 500.0000 mg | ORAL_TABLET | Freq: Every day | ORAL | Status: DC

## 2014-04-23 NOTE — Telephone Encounter (Signed)
Patient notified

## 2014-04-23 NOTE — Telephone Encounter (Signed)
Pt saw Dr Ronnald Ramp 11/2 and was told if not better to call and he would prescribe more medication. Currently taking prednisone & cough meds but not helping. Please advise  [employer] [facility] [employer] / Pharmacy

## 2014-04-23 NOTE — Telephone Encounter (Signed)
done

## 2014-07-08 ENCOUNTER — Other Ambulatory Visit: Payer: Self-pay | Admitting: Internal Medicine

## 2014-07-15 ENCOUNTER — Telehealth: Payer: Self-pay | Admitting: *Deleted

## 2014-07-15 NOTE — Telephone Encounter (Signed)
Left msg on triage stating pharmacy has been trying get refill on her alprazolam x's week. Still haven't received call back. Called [facility] spoke with Deanna verified of rx was sent back on 1/19. DeAnna stated they did received and med is waiting for pick-up. Called pt inform her med ready for pick-up...Johny Chess

## 2014-11-08 ENCOUNTER — Other Ambulatory Visit: Payer: Self-pay | Admitting: Internal Medicine

## 2014-12-02 ENCOUNTER — Other Ambulatory Visit (INDEPENDENT_AMBULATORY_CARE_PROVIDER_SITE_OTHER): Payer: Medicare Other

## 2014-12-02 ENCOUNTER — Ambulatory Visit (INDEPENDENT_AMBULATORY_CARE_PROVIDER_SITE_OTHER): Payer: Medicare Other | Admitting: Internal Medicine

## 2014-12-02 VITALS — BP 128/76 | HR 76 | Temp 98.6°F | Resp 16 | Ht 62.0 in | Wt 171.0 lb

## 2014-12-02 DIAGNOSIS — F411 Generalized anxiety disorder: Secondary | ICD-10-CM | POA: Diagnosis not present

## 2014-12-02 DIAGNOSIS — I2510 Atherosclerotic heart disease of native coronary artery without angina pectoris: Secondary | ICD-10-CM | POA: Diagnosis not present

## 2014-12-02 DIAGNOSIS — I10 Essential (primary) hypertension: Secondary | ICD-10-CM

## 2014-12-02 DIAGNOSIS — E785 Hyperlipidemia, unspecified: Secondary | ICD-10-CM | POA: Diagnosis not present

## 2014-12-02 LAB — LIPID PANEL
Cholesterol: 233 mg/dL — ABNORMAL HIGH (ref 0–200)
HDL: 31.7 mg/dL — ABNORMAL LOW (ref >39.00)
NonHDL: 201.3
Total CHOL/HDL Ratio: 7
Triglycerides: 291 mg/dL — ABNORMAL HIGH (ref 0.0–149.0)
VLDL: 58.2 mg/dL — ABNORMAL HIGH (ref 0.0–40.0)

## 2014-12-02 LAB — LDL CHOLESTEROL, DIRECT: Direct LDL: 159 mg/dL

## 2014-12-02 LAB — COMPREHENSIVE METABOLIC PANEL
ALK PHOS: 152 U/L — AB (ref 39–117)
ALT: 20 U/L (ref 0–35)
AST: 26 U/L (ref 0–37)
Albumin: 4.7 g/dL (ref 3.5–5.2)
BUN: 21 mg/dL (ref 6–23)
CO2: 30 mEq/L (ref 19–32)
CREATININE: 0.81 mg/dL (ref 0.40–1.20)
Calcium: 9.9 mg/dL (ref 8.4–10.5)
Chloride: 102 mEq/L (ref 96–112)
GFR: 73.5 mL/min (ref >60.00)
Glucose, Bld: 92 mg/dL (ref 70–99)
Potassium: 4.3 mEq/L (ref 3.5–5.1)
Sodium: 137 mEq/L (ref 135–145)
Total Bilirubin: 0.8 mg/dL (ref 0.2–1.2)
Total Protein: 7.5 g/dL (ref 6.0–8.3)

## 2014-12-02 LAB — CBC WITH DIFFERENTIAL/PLATELET
BASOS PCT: 0.5 % (ref 0.0–3.0)
Basophils Absolute: 0 10*3/uL (ref 0.0–0.1)
EOS ABS: 0.1 10*3/uL (ref 0.0–0.7)
Eosinophils Relative: 1.5 % (ref 0.0–5.0)
HCT: 45.4 % (ref 36.0–46.0)
HEMOGLOBIN: 15.1 g/dL — AB (ref 12.0–15.0)
LYMPHS ABS: 0.9 10*3/uL (ref 0.7–4.0)
LYMPHS PCT: 17 % (ref 12.0–46.0)
MCHC: 33.3 g/dL (ref 30.0–36.0)
MCV: 86.2 fl (ref 78.0–100.0)
MONOS PCT: 5.3 % (ref 3.0–12.0)
Monocytes Absolute: 0.3 10*3/uL (ref 0.1–1.0)
NEUTROS ABS: 4.1 10*3/uL (ref 1.4–7.7)
Neutrophils Relative %: 75.7 % (ref 43.0–77.0)
PLATELETS: 178 10*3/uL (ref 150.0–400.0)
RBC: 5.27 Mil/uL — ABNORMAL HIGH (ref 3.87–5.11)
RDW: 13.2 % (ref 11.5–15.5)
WBC: 5.5 10*3/uL (ref 4.0–10.5)

## 2014-12-02 LAB — TSH: TSH: 2.64 u[IU]/mL (ref 0.35–4.50)

## 2014-12-02 MED ORDER — FUROSEMIDE 20 MG PO TABS: 20.0000 mg | ORAL_TABLET | Freq: Every day | ORAL | Status: DC

## 2014-12-02 MED ORDER — ALPRAZOLAM 0.5 MG PO TABS: 0.5000 mg | ORAL_TABLET | Freq: Three times a day (TID) | ORAL | Status: DC | PRN

## 2014-12-02 MED ORDER — POTASSIUM CHLORIDE CRYS ER 20 MEQ PO TBCR: 20.0000 meq | EXTENDED_RELEASE_TABLET | Freq: Two times a day (BID) | ORAL | Status: DC

## 2014-12-02 NOTE — Progress Notes (Signed)
Subjective:  Patient ID: [name], female    DOB: 06/11/1941  Age: 74 y.o. MRN: [id number]  CC: Hypertension and Hyperlipidemia   HPI [name] presents for follow up, she reports a lot stress due to her husband's diagnosis of dementia and she requests a refill on xanax, she offers no other complaints.  Outpatient Prescriptions Prior to Visit  Medication Sig Dispense Refill  . Brinzolamide-Brimonidine (SIMBRINZA) 1-0.2 % SUSP Apply to eye 2 (two) times daily.    . clopidogrel (PLAVIX) 75 MG tablet Take 1 tablet (75 mg total) by mouth daily. 90 tablet 3  . loperamide (IMODIUM A-D) 2 MG tablet Take 1 tablet (2 mg total) by mouth 4 (four) times daily as needed for diarrhea or loose stools. 30 tablet 0  . nitroGLYCERIN (NITROSTAT) 0.4 MG SL tablet Place 1 tablet (0.4 mg total) under the tongue every 5 (five) minutes as needed for chest pain. 60 tablet 5  . Vitamin D, Ergocalciferol, (DRISDOL) 50000 UNITS CAPS capsule Take 1 capsule (50,000 Units total) by mouth every Sunday. 12 capsule 3  . ALPRAZolam (XANAX) 0.5 MG tablet TAKE ONE TABLET BY MOUTH THREE TIMES DAILY AS NEEDED FOR ANXIETY 90 tablet 1  . furosemide (LASIX) 20 MG tablet Take 1 tablet (20 mg total) by mouth daily. 90 tablet 3  . methylPREDNISolone (MEDROL, PAK,) 4 MG tablet follow package directions 21 tablet 0  . potassium chloride SA (K-DUR,KLOR-CON) 20 MEQ tablet Take 1 tablet (20 mEq total) by mouth 2 (two) times daily. 180 tablet 3  . azithromycin (ZITHROMAX) 500 MG tablet Take 1 tablet (500 mg total) by mouth daily. 3 tablet 0  . promethazine-dextromethorphan (PROMETHAZINE-DM) 6.25-15 MG/5ML syrup Take 5 mLs by mouth 4 (four) times daily as needed for cough. 118 mL 0   No facility-administered medications prior to visit.    ROS Review of Systems  Constitutional: Negative.  Negative for fever, chills, diaphoresis, appetite change and fatigue.  HENT: Negative.   Eyes: Negative.   Respiratory: Negative.   Negative for cough, choking, chest tightness, shortness of breath and stridor.   Cardiovascular: Negative.  Negative for chest pain, palpitations and leg swelling.  Gastrointestinal: Negative.  Negative for nausea, vomiting, abdominal pain, diarrhea, constipation and blood in stool.  Endocrine: Negative.   Genitourinary: Negative.  Negative for difficulty urinating.  Musculoskeletal: Negative.  Negative for myalgias, back pain and joint swelling.  Skin: Negative.   Allergic/Immunologic: Negative.   Neurological: Negative.  Negative for dizziness, tremors, syncope and light-headedness.  Hematological: Negative.  Negative for adenopathy. Does not bruise/bleed easily.  Psychiatric/Behavioral: Positive for sleep disturbance. Negative for suicidal ideas, hallucinations, behavioral problems, confusion, self-injury, dysphoric mood, decreased concentration and agitation. The patient is nervous/anxious. The patient is not hyperactive.     Objective:  BP 128/76 mmHg  Pulse 76  Temp(Src) 98.6 F (37 C) (Oral)  Resp 16  Ht 5\' 2"  (1.575 m)  Wt 171 lb (77.565 kg)  BMI 31.27 kg/m2  SpO2 96%  BP Readings from Last 3 Encounters:  12/02/14 128/76  04/21/14 150/84  02/19/14 120/80    Wt Readings from Last 3 Encounters:  12/02/14 171 lb (77.565 kg)  04/21/14 178 lb 3.2 oz (80.831 kg)  02/19/14 180 lb (81.647 kg)    Physical Exam  Constitutional: She is oriented to person, place, and time. She appears well-developed and well-nourished. No distress.  HENT:  Head: Normocephalic and atraumatic.  Mouth/Throat: Oropharynx is clear and moist. No oropharyngeal exudate.  Eyes:  Conjunctivae are normal. Right eye exhibits no discharge. Left eye exhibits no discharge. No scleral icterus.  Neck: Normal range of motion. Neck supple. No JVD present. No tracheal deviation present. No thyromegaly present.  Cardiovascular: Normal rate, regular rhythm, normal heart sounds and intact distal pulses.  Exam reveals  no gallop and no friction rub.   No murmur heard. Pulmonary/Chest: Effort normal and breath sounds normal. No stridor. No respiratory distress. She has no wheezes. She has no rales. She exhibits no tenderness.  Abdominal: Soft. Bowel sounds are normal. She exhibits no distension and no mass. There is no tenderness. There is no rebound and no guarding.  Musculoskeletal: Normal range of motion. She exhibits no edema or tenderness.  Lymphadenopathy:    She has no cervical adenopathy.  Neurological: She is oriented to person, place, and time.  Skin: Skin is warm and dry. No rash noted. She is not diaphoretic. No erythema. No pallor.  Psychiatric: She has a normal mood and affect. Her behavior is normal. Judgment and thought content normal.  Vitals reviewed.   Lab Results  Component Value Date   WBC 5.5 12/02/2014   HGB 15.1* 12/02/2014   HCT 45.4 12/02/2014   PLT 178.0 12/02/2014   GLUCOSE 92 12/02/2014   CHOL 233* 12/02/2014   TRIG 291.0* 12/02/2014   HDL 31.70* 12/02/2014   LDLDIRECT 159.0 12/02/2014   LDLCALC 150* 09/05/2013   ALT 20 12/02/2014   AST 26 12/02/2014   NA 137 12/02/2014   K 4.3 12/02/2014   CL 102 12/02/2014   CREATININE 0.81 12/02/2014   BUN 21 12/02/2014   CO2 30 12/02/2014   TSH 2.64 12/02/2014   INR 1.0 RATIO 04/28/2008   HGBA1C 5.1 07/06/2012    Mr Orbits Wo/w Cm  12/05/2013   CLINICAL DATA:  Pain left eye for 2 months.  BUN and creatinine were obtained on site at [facility] at [address]Results: BUN 8.0 mg/dL, Creatinine 0.8 mg/dL.  EXAM: MRI ORBIT WITHOUT AND WITH CONTRAST  TECHNIQUE: Multiplanar, multiecho pulse sequences of the orbits and surrounding structures were obtained including fat saturation techniques without and with intravenous contrast administration.  CONTRAST:  35mL MULTIHANCE GADOBENATE DIMEGLUMINE 529 MG/ML IV SOLN  COMPARISON:  11/29/2013 CT orbits.  07/14/2011 MR brain.  FINDINGS: MRI ORBITS FINDINGS  Examination is  limited by dental artifact which is more prominent on the left and most significant involving the T2 weighted and post contrast enhanced T1 weighted fat-suppressed sequences. Taking this limitation into account, orbital structures appear symmetric and normal bilaterally. Globes appear intact. Extra-ocular muscles unremarkable. Retrobulbar fat without fatty infiltration. No lacrimal gland abnormality detected. No primary optic nerve lesion is detected. There is slight tilt of the left aspect of the optic chiasm without obvious adjacent mass or aneurysm.  Fetal type contribution to the posterior cerebral artery bilaterally. No obvious aneurysm. If this were of high clinical concern then MR angiogram can be performed for further delineation.  Similar normal appearance of the cavernous sinus. No pituitary lesion identified.  Limited imaging of intracranial structures reveals small vessel disease type changes.  IMPRESSION:  Dental artifact limits evaluation. No cause of patient's left orbital pain detected. Please see above discussion.   Electronically Signed   By: Chauncey Cruel M.D.   On: 12/05/2013 14:17    Assessment & Plan:   Ryna was seen today for hypertension and hyperlipidemia.  Diagnoses and all orders for this visit:  Essential hypertension - her BP is well controlled,  lytes and renal function are stable Orders: -     furosemide (LASIX) 20 MG tablet; Take 1 tablet (20 mg total) by mouth daily. -     potassium chloride SA (K-DUR,KLOR-CON) 20 MEQ tablet; Take 1 tablet (20 mEq total) by mouth 2 (two) times daily. -     Comprehensive metabolic panel; Future -     CBC with Differential/Platelet; Future -     TSH; Future  Coronary artery disease involving native coronary artery of native heart without angina pectoris - she has no s/s related to this, she is not willing to f/up with cardiology at this time Orders: -     Lipid panel; Future  GAD (generalized anxiety disorder) Orders: -      ALPRAZolam (XANAX) 0.5 MG tablet; Take 1 tablet (0.5 mg total) by mouth 3 (three) times daily as needed. for anxiety  Hyperlipidemia with target LDL less than 100 - her LDL is not at goal, she refuses to take a statin Orders: -     Lipid panel; Future -     Comprehensive metabolic panel; Future -     TSH; Future   I have discontinued Ms. Raybourn's methylPREDNISolone, promethazine-dextromethorphan, and azithromycin. I have also changed her ALPRAZolam. Additionally, I am having her maintain her Brinzolamide-Brimonidine, clopidogrel, nitroGLYCERIN, Vitamin D (Ergocalciferol), loperamide, furosemide, and potassium chloride SA.  Meds ordered this encounter  Medications  . furosemide (LASIX) 20 MG tablet    Sig: Take 1 tablet (20 mg total) by mouth daily.    Dispense:  90 tablet    Refill:  3  . potassium chloride SA (K-DUR,KLOR-CON) 20 MEQ tablet    Sig: Take 1 tablet (20 mEq total) by mouth 2 (two) times daily.    Dispense:  180 tablet    Refill:  3  . ALPRAZolam (XANAX) 0.5 MG tablet    Sig: Take 1 tablet (0.5 mg total) by mouth 3 (three) times daily as needed. for anxiety    Dispense:  90 tablet    Refill:  2     Follow-up: Return in about 4 months (around 04/03/2015).  Scarlette Calico, MD

## 2014-12-02 NOTE — Progress Notes (Signed)
Pre visit review using our clinic review tool, if applicable. No additional management support is needed unless otherwise documented below in the visit note. 

## 2014-12-02 NOTE — Patient Instructions (Signed)

## 2014-12-03 ENCOUNTER — Telehealth: Payer: Self-pay | Admitting: Geriatric Medicine

## 2014-12-03 ENCOUNTER — Encounter: Payer: Self-pay | Admitting: Internal Medicine

## 2014-12-03 DIAGNOSIS — Z1231 Encounter for screening mammogram for malignant neoplasm of breast: Secondary | ICD-10-CM

## 2014-12-03 NOTE — Telephone Encounter (Signed)
Patient has not had a mammogram. She would like to have one. I will put orders in and patient said she will call and schedule it.

## 2014-12-30 ENCOUNTER — Telehealth: Payer: Self-pay | Admitting: Internal Medicine

## 2015-01-02 ENCOUNTER — Other Ambulatory Visit: Payer: Self-pay | Admitting: Internal Medicine

## 2015-01-02 DIAGNOSIS — Z1231 Encounter for screening mammogram for malignant neoplasm of breast: Secondary | ICD-10-CM

## 2015-01-22 ENCOUNTER — Ambulatory Visit
Admission: RE | Admit: 2015-01-22 | Discharge: 2015-01-22 | Disposition: A | Discharge status: Home / Self Care | Payer: Medicare Other | Source: Ambulatory Visit | Attending: Internal Medicine | Admitting: Internal Medicine

## 2015-01-22 DIAGNOSIS — Z1231 Encounter for screening mammogram for malignant neoplasm of breast: Secondary | ICD-10-CM

## 2015-01-23 LAB — HM MAMMOGRAPHY: HM MAMMO: NORMAL

## 2015-01-30 ENCOUNTER — Telehealth: Payer: Self-pay | Admitting: *Deleted

## 2015-01-30 DIAGNOSIS — I10 Essential (primary) hypertension: Secondary | ICD-10-CM

## 2015-01-30 MED ORDER — FUROSEMIDE 20 MG PO TABS: 20.0000 mg | ORAL_TABLET | Freq: Every day | ORAL | Status: DC

## 2015-01-30 NOTE — Telephone Encounter (Signed)
Left msg on triage stating she hasn't received her lasix from mail order. Needing a rx call into local pharmacy. Called pt verified pharmacy inform will send to [facility]...Johny Chess

## 2015-02-05 ENCOUNTER — Encounter: Payer: Self-pay | Admitting: Internal Medicine

## 2015-03-31 ENCOUNTER — Encounter: Payer: Self-pay | Admitting: Internal Medicine

## 2015-03-31 ENCOUNTER — Ambulatory Visit (INDEPENDENT_AMBULATORY_CARE_PROVIDER_SITE_OTHER): Payer: Medicare Other | Admitting: Internal Medicine

## 2015-03-31 VITALS — BP 122/80 | HR 87 | Temp 98.7°F | Resp 18 | Wt 174.0 lb

## 2015-03-31 DIAGNOSIS — J069 Acute upper respiratory infection, unspecified: Secondary | ICD-10-CM | POA: Diagnosis not present

## 2015-03-31 DIAGNOSIS — Z23 Encounter for immunization: Secondary | ICD-10-CM

## 2015-03-31 NOTE — Progress Notes (Signed)
Pre visit review using our clinic review tool, if applicable. No additional management support is needed unless otherwise documented below in the visit note. 

## 2015-03-31 NOTE — Progress Notes (Signed)
Subjective:    Patient ID: [name], female    DOB: 09/01/1940, 74 y.o.   MRN: [id number]  HPI She is here today for cold symptoms. Her symptoms started two days ago.  She has a cough, headache, ear pain, sore throat.  This morning she was coughing up green, thick phlegm and had green mucus out of her nose.  She denies fever and chills. Her greatgrandkids had strep about 10 days ago, but have completed antibiotics.  She has not taken any meds for her symtpoms.  Her old pcp used to give her prednisone and a zpak whenever she had these symptoms.  She denies smoking in the past and has never had asthma.  She has had bronchitis in the past.  Medications and allergies reviewed with patient and updated if appropriate.  Patient Active Problem List   Diagnosis Date Noted  . GAD (generalized anxiety disorder) 12/02/2014  . IBS (irritable bowel syndrome) 01/20/2014  . B12 deficiency anemia 07/10/2013  . Unspecified vitamin D deficiency 01/04/2013  . HTN (hypertension) 07/26/2011  . CAD (coronary artery disease) 07/26/2011  . FATTY LIVER DISEASE 06/20/2009  . OBESITY 09/08/2007  . OSTEOPENIA 09/07/2007  . Hyperlipidemia with target LDL less than 100 07/30/2007    Past Medical History  Diagnosis Date  . Unspecified essential hypertension   . CAD (coronary artery disease)   . Other and unspecified hyperlipidemia   . Obesity, unspecified   . Diverticulosis of colon (without mention of hemorrhage)   . Other chronic nonalcoholic liver disease   . Fibromyalgia   . Disorder of bone and cartilage, unspecified   . Dizziness and giddiness   . Anxiety state, unspecified   . Fatty liver   . Skin cancer   . TIA (transient ischemic attack)     Past Surgical History  Procedure Laterality Date  . Tonsillectomy and adenoidectomy    . Total abdominal hysterectomy w/ bilateral salpingoophorectomy    . Cholecystectomy    . Back surgery    . Mohs surgery      Social History   Social  History  . Marital Status: Married    Spouse Name: Thompson Grayer. Arnott Sr.  . Number of Children: 4  . Years of Education: N/A   Occupational History  .     Social History Main Topics  . Smoking status: Never Smoker   . Smokeless tobacco: Never Used  . Alcohol Use: No  . Drug Use: No  . Sexual Activity: Not Currently   Other Topics Concern  . None   Social History Narrative    Review of Systems  Constitutional: Negative for fever, chills and appetite change.  HENT: Positive for congestion, ear pain, sinus pressure and sore throat.   Respiratory: Positive for cough. Negative for shortness of breath and wheezing.   Cardiovascular: Negative for chest pain.  Gastrointestinal: Positive for diarrhea (IBS,chronic). Negative for nausea, vomiting and abdominal pain.  Musculoskeletal: Positive for myalgias.  Neurological: Positive for headaches. Negative for dizziness and light-headedness.       Objective:   Filed Vitals:   03/31/15 1304  BP: 122/80  Pulse: 87  Temp: 98.7 F (37.1 C)  Resp: 18   Filed Weights   03/31/15 1304  Weight: 174 lb (78.926 kg)   Body mass index is 31.82 kg/(m^2).   Physical Exam  Constitutional: She appears well-developed and well-nourished. No distress.  HENT:  Head: Normocephalic and atraumatic.  Right Ear: External ear normal.  Left  Ear: External ear normal.  Mouth/Throat: Oropharynx is clear and moist. No oropharyngeal exudate.  B/l tubes in TM - appear intact and not obstructed  Eyes: Conjunctivae are normal.  Neck: Neck supple. No tracheal deviation present. No thyromegaly present.  Cardiovascular: Normal rate, regular rhythm and normal heart sounds.   No murmur heard. Pulmonary/Chest: Effort normal and breath sounds normal. No respiratory distress. She has no wheezes.  Lymphadenopathy:    She has no cervical adenopathy.        Assessment & Plan:   URI It has only been a few days and this may be a viral infection Start saline  nasal sprays, mucinex and advil Increase rest and fluids If no improvement or symptoms worsen she will call and I will likely send in a antibiotic

## 2015-03-31 NOTE — Patient Instructions (Addendum)
Start saline nasal spray, mucinex, advil/tylenol Increase rest and fluids  If your symptoms worsen or do not improve call and we will consider an antibiotic  Upper Respiratory Infection, Adult Most upper respiratory infections (URIs) are a viral infection of the air passages leading to the lungs. A URI affects the nose, throat, and upper air passages. The most common type of URI is nasopharyngitis and is typically referred to as "the common cold." URIs run their course and usually go away on their own. Most of the time, a URI does not require medical attention, but sometimes a bacterial infection in the upper airways can follow a viral infection. This is called a secondary infection. Sinus and middle ear infections are common types of secondary upper respiratory infections. Bacterial pneumonia can also complicate a URI. A URI can worsen asthma and chronic obstructive pulmonary disease (COPD). Sometimes, these complications can require emergency medical care and may be life threatening.  CAUSES Almost all URIs are caused by viruses. A virus is a type of germ and can spread from one person to another.  RISKS FACTORS You may be at risk for a URI if:   You smoke.   You have chronic heart or lung disease.  You have a weakened defense (immune) system.   You are very young or very old.   You have nasal allergies or asthma.  You work in crowded or poorly ventilated areas.  You work in health care facilities or schools. SIGNS AND SYMPTOMS  Symptoms typically develop 2-3 days after you come in contact with a cold virus. Most viral URIs last 7-10 days. However, viral URIs from the influenza virus (flu virus) can last 14-18 days and are typically more severe. Symptoms may include:   Runny or stuffy (congested) nose.   Sneezing.   Cough.   Sore throat.   Headache.   Fatigue.   Fever.   Loss of appetite.   Pain in your forehead, behind your eyes, and over your cheekbones  (sinus pain).  Muscle aches.  DIAGNOSIS  Your health care provider may diagnose a URI by:  Physical exam.  Tests to check that your symptoms are not due to another condition such as:  Strep throat.  Sinusitis.  Pneumonia.  Asthma. TREATMENT  A URI goes away on its own with time. It cannot be cured with medicines, but medicines may be prescribed or recommended to relieve symptoms. Medicines may help:  Reduce your fever.  Reduce your cough.  Relieve nasal congestion. HOME CARE INSTRUCTIONS   Take medicines only as directed by your health care provider.   Gargle warm saltwater or take cough drops to comfort your throat as directed by your health care provider.  Use a warm mist humidifier or inhale steam from a shower to increase air moisture. This may make it easier to breathe.  Drink enough fluid to keep your urine clear or pale yellow.   Eat soups and other clear broths and maintain good nutrition.   Rest as needed.   Return to work when your temperature has returned to normal or as your health care provider advises. You may need to stay home longer to avoid infecting others. You can also use a face mask and careful hand washing to prevent spread of the virus.  Increase the usage of your inhaler if you have asthma.   Do not use any tobacco products, including cigarettes, chewing tobacco, or electronic cigarettes. If you need help quitting, ask your health care provider. PREVENTION  The best way to protect yourself from getting a cold is to practice good hygiene.   Avoid oral or hand contact with people with cold symptoms.   Wash your hands often if contact occurs.  There is no clear evidence that vitamin C, vitamin E, echinacea, or exercise reduces the chance of developing a cold. However, it is always recommended to get plenty of rest, exercise, and practice good nutrition.  SEEK MEDICAL CARE IF:   You are getting worse rather than better.   Your  symptoms are not controlled by medicine.   You have chills.  You have worsening shortness of breath.  You have brown or red mucus.  You have yellow or brown nasal discharge.  You have pain in your face, especially when you bend forward.  You have a fever.  You have swollen neck glands.  You have pain while swallowing.  You have white areas in the back of your throat. SEEK IMMEDIATE MEDICAL CARE IF:   You have severe or persistent:  Headache.  Ear pain.  Sinus pain.  Chest pain.  You have chronic lung disease and any of the following:  Wheezing.  Prolonged cough.  Coughing up blood.  A change in your usual mucus.  You have a stiff neck.  You have changes in your:  Vision.  Hearing.  Thinking.  Mood. MAKE SURE YOU:   Understand these instructions.  Will watch your condition.  Will get help right away if you are not doing well or get worse.   This information is not intended to replace advice given to you by your health care provider. Make sure you discuss any questions you have with your health care provider.   Document Released: 11/30/2000 Document Revised: 10/21/2014 Document Reviewed: 09/11/2013 Elsevier Interactive Patient Education [employer].

## 2015-04-10 ENCOUNTER — Telehealth: Payer: Self-pay | Admitting: Internal Medicine

## 2015-04-10 NOTE — Telephone Encounter (Signed)
Pt call in said that she is not any better, wants to know if anything can be called in for her.  It is now in her chest.

## 2015-04-13 MED ORDER — AZITHROMYCIN 250 MG PO TABS
ORAL_TABLET | ORAL | Status: DC
Start: 2015-04-13 — End: 2015-11-10

## 2015-04-13 NOTE — Telephone Encounter (Signed)
rx sent to pharmacy

## 2015-04-13 NOTE — Telephone Encounter (Signed)
Please advise 

## 2015-04-16 ENCOUNTER — Telehealth: Payer: Self-pay | Admitting: Pulmonary Disease

## 2015-04-16 NOTE — Telephone Encounter (Signed)
Spoke with the pt  She c/o cough and chest and chest congestion for over a month now  Requesting SN to call her in something  I advised that she will need new pulm cons appt since already est with new PCP and we can not legally prescribe anything since is has been over a yr since ov  I have scheduled her for consult with MW for tomorrow at 1:30 pm

## 2015-04-17 ENCOUNTER — Institutional Professional Consult (permissible substitution): Payer: Medicare Other | Admitting: Internal Medicine

## 2015-04-28 ENCOUNTER — Other Ambulatory Visit: Payer: Self-pay | Admitting: Internal Medicine

## 2015-05-07 ENCOUNTER — Other Ambulatory Visit: Payer: Self-pay | Admitting: Internal Medicine

## 2015-05-11 ENCOUNTER — Telehealth: Payer: Self-pay | Admitting: Internal Medicine

## 2015-05-11 NOTE — Telephone Encounter (Signed)
Pt states [name] on [employer] has not received her prescription for ALPRAZolam (XANAX) 0.5 MG tablet [id number] Can you please send it again

## 2015-05-11 NOTE — Telephone Encounter (Signed)
Patient called and said pharmacy had not received her prescription yet. I called the medication in for her to pick up.

## 2015-06-03 ENCOUNTER — Telehealth: Payer: Self-pay

## 2015-06-03 NOTE — Telephone Encounter (Signed)
Call to schedule AWV and will have to call back; Per spouse she is not at home.

## 2015-06-04 NOTE — Telephone Encounter (Signed)
2nd outreach for AWV; LVM to call the practice and schedule ;

## 2015-11-03 ENCOUNTER — Other Ambulatory Visit: Payer: Self-pay | Admitting: Internal Medicine

## 2015-11-10 ENCOUNTER — Encounter: Payer: Self-pay | Admitting: Internal Medicine

## 2015-11-10 ENCOUNTER — Ambulatory Visit (INDEPENDENT_AMBULATORY_CARE_PROVIDER_SITE_OTHER): Payer: Medicare Other | Admitting: Internal Medicine

## 2015-11-10 VITALS — BP 122/76 | HR 74 | Temp 98.6°F | Resp 20 | Wt 177.0 lb

## 2015-11-10 DIAGNOSIS — R059 Cough, unspecified: Secondary | ICD-10-CM

## 2015-11-10 DIAGNOSIS — R062 Wheezing: Secondary | ICD-10-CM

## 2015-11-10 DIAGNOSIS — I10 Essential (primary) hypertension: Secondary | ICD-10-CM

## 2015-11-10 DIAGNOSIS — R05 Cough: Secondary | ICD-10-CM

## 2015-11-10 MED ORDER — BENZONATATE 100 MG PO CAPS: ORAL_CAPSULE | ORAL | Status: DC

## 2015-11-10 MED ORDER — PREDNISONE 10 MG PO TABS
ORAL_TABLET | ORAL | Status: DC
Start: 2015-11-10 — End: 2015-11-25

## 2015-11-10 MED ORDER — LEVOFLOXACIN 500 MG PO TABS: 500.0000 mg | ORAL_TABLET | Freq: Every day | ORAL | Status: DC

## 2015-11-10 NOTE — Progress Notes (Signed)
Pre visit review using our clinic review tool, if applicable. No additional management support is needed unless otherwise documented below in the visit note. 

## 2015-11-10 NOTE — Progress Notes (Signed)
Subjective:    Patient ID: [name], female    DOB: May 16, 1941, 75 y.o.   MRN: [id number]  HPI  Here with acute onset mild to mod 2-3 days ST, HA, general weakness and malaise, with prod cough greenish sputum, but Pt denies chest pain, increased sob or doe, wheezing, orthopnea, PND, increased LE swelling, palpitations, dizziness or syncope, except for increased sob and wheezing last PM,  Pt denies new neurological symptoms such as new headache, or facial or extremity weakness or numbnessd   Pt denies polydipsia, polyuria.  Denies worsening depressive symptoms, suicidal ideation, or panic, though has ongoing anxiety Past Medical History  Diagnosis Date  . Unspecified essential hypertension   . CAD (coronary artery disease)   . Other and unspecified hyperlipidemia   . Obesity, unspecified   . Diverticulosis of colon (without mention of hemorrhage)   . Other chronic nonalcoholic liver disease   . Fibromyalgia   . Disorder of bone and cartilage, unspecified   . Dizziness and giddiness   . Anxiety state, unspecified   . Fatty liver   . Skin cancer   . TIA (transient ischemic attack)    Past Surgical History  Procedure Laterality Date  . Tonsillectomy and adenoidectomy    . Total abdominal hysterectomy w/ bilateral salpingoophorectomy    . Cholecystectomy    . Back surgery    . Mohs surgery      reports that she has never smoked. She has never used smokeless tobacco. She reports that she does not drink alcohol or use illicit drugs. family history includes Breast cancer in her sister; Cancer in her brother; Stomach cancer in her mother. Allergies  Allergen Reactions  . Diltiazem Hcl Other (See Comments)    REACTION: pt states it made her dizzy...  . Statins     Muscle aches  . Zetia [Ezetimibe]     "it made me feel bad"  . Codeine Nausea Only  . Sulfonamide Derivatives Hives, Itching and Rash  . Tape Itching  . Tramadol Itching   Current Outpatient Prescriptions on File  Prior to Visit  Medication Sig Dispense Refill  . ALPRAZolam (XANAX) 0.5 MG tablet TAKE ONE TABLET BY MOUTH THREE TIMES DAILY AS NEEDED 90 tablet 2  . Brinzolamide-Brimonidine (SIMBRINZA) 1-0.2 % SUSP Apply to eye 2 (two) times daily.    . furosemide (LASIX) 20 MG tablet Take 1 tablet (20 mg total) by mouth daily. Physical due in June must see md for refills 30 tablet 0  . loperamide (IMODIUM A-D) 2 MG tablet Take 1 tablet (2 mg total) by mouth 4 (four) times daily as needed for diarrhea or loose stools. 30 tablet 0  . nitroGLYCERIN (NITROSTAT) 0.4 MG SL tablet Place 1 tablet (0.4 mg total) under the tongue every 5 (five) minutes as needed for chest pain. 60 tablet 5  . potassium chloride SA (K-DUR,KLOR-CON) 20 MEQ tablet Take 1 tablet (20 mEq total) by mouth 2 (two) times daily. 180 tablet 3   No current facility-administered medications on file prior to visit.   Review of Systems  Constitutional: Negative for unusual diaphoresis or night sweats HENT: Negative for ear swelling or discharge Eyes: Negative for worsening visual haziness  Respiratory: Negative for choking and stridor.   Gastrointestinal: Negative for distension or worsening eructation Genitourinary: Negative for retention or change in urine volume.  Musculoskeletal: Negative for other MSK pain or swelling Skin: Negative for color change and worsening wound Neurological: Negative for tremors and numbness other  than noted  Psychiatric/Behavioral: Negative for decreased concentration or agitation other than above       Objective:   Physical Exam VS noted,  Constitutional: Pt appears in no apparent distress HENT: Head: NCAT.  Right Ear: External ear normal.  Left Ear: External ear normal.  Bilat tm's with mild erythema.  Max sinus areas non tender.  Pharynx with mild erythema, no exudate Eyes: . Pupils are equal, round, and reactive to light. Conjunctivae and EOM are normal Neck: Normal range of motion. Neck supple.    Cardiovascular: Normal rate and regular rhythm.   Pulmonary/Chest: Effort normal and breath sounds with few scattered bilat wheezing and few LLL rales.  Neurological: Pt is alert. Not confused , motor grossly intact Skin: Skin is warm. No rash, no LE edema Psychiatric: Pt behavior is normal. No agitation. 1-2+ nervous     Assessment & Plan:

## 2015-11-10 NOTE — Patient Instructions (Signed)
You had the antibiotic, and the steroid shot today  Please take all new medication as prescribed  - the antibiotic (levaquin), cough pills (tessalon) and prednisone as directed  Please continue all other medications as before, and refills have been done if requested.  Please have the pharmacy call with any other refills you may need.  Please keep your appointments with your specialists as you may have planned  Please go to the XRAY Department in the Basement (go straight as you get off the elevator) for the x-ray testing tomorrow  You will be contacted by phone if any changes need to be made immediately.  Otherwise, you will receive a letter about your results with an explanation, but please check with MyChart first.  Please remember to sign up for MyChart if you have not done so, as this will be important to you in the future with finding out test results, communicating by private email, and scheduling acute appointments online when needed.

## 2015-11-11 ENCOUNTER — Ambulatory Visit (INDEPENDENT_AMBULATORY_CARE_PROVIDER_SITE_OTHER)
Admission: RE | Admit: 2015-11-11 | Discharge: 2015-11-11 | Disposition: A | Discharge status: Home / Self Care | Payer: Medicare Other | Source: Ambulatory Visit | Attending: Internal Medicine | Admitting: Internal Medicine

## 2015-11-11 ENCOUNTER — Encounter: Payer: Self-pay | Admitting: Internal Medicine

## 2015-11-11 DIAGNOSIS — R05 Cough: Secondary | ICD-10-CM | POA: Diagnosis not present

## 2015-11-11 DIAGNOSIS — R059 Cough, unspecified: Secondary | ICD-10-CM

## 2015-11-16 DIAGNOSIS — R059 Cough, unspecified: Secondary | ICD-10-CM | POA: Insufficient documentation

## 2015-11-16 DIAGNOSIS — R05 Cough: Secondary | ICD-10-CM | POA: Insufficient documentation

## 2015-11-16 DIAGNOSIS — R062 Wheezing: Secondary | ICD-10-CM | POA: Insufficient documentation

## 2015-11-16 NOTE — Assessment & Plan Note (Signed)
Mild to mod, c/w bronchitis vs pna, for antibx course,  Cough med prn, to f/u any worsening symptoms or concerns

## 2015-11-16 NOTE — Assessment & Plan Note (Signed)
stable overall by history and exam, recent data reviewed with pt, and pt to continue medical treatment as before,  to f/u any worsening symptoms or concerns BP Readings from Last 3 Encounters:  11/10/15 122/76  03/31/15 122/80  12/02/14 128/76

## 2015-11-16 NOTE — Assessment & Plan Note (Signed)
Mild to mod, for depomedrol IM, predpac asd,,  to f/u any worsening symptoms or concerns 

## 2015-11-25 ENCOUNTER — Ambulatory Visit (INDEPENDENT_AMBULATORY_CARE_PROVIDER_SITE_OTHER)
Admission: RE | Admit: 2015-11-25 | Discharge: 2015-11-25 | Disposition: A | Discharge status: Home / Self Care | Payer: Medicare Other | Source: Ambulatory Visit | Attending: Internal Medicine | Admitting: Internal Medicine

## 2015-11-25 ENCOUNTER — Ambulatory Visit (INDEPENDENT_AMBULATORY_CARE_PROVIDER_SITE_OTHER): Payer: Medicare Other | Admitting: Internal Medicine

## 2015-11-25 ENCOUNTER — Other Ambulatory Visit (INDEPENDENT_AMBULATORY_CARE_PROVIDER_SITE_OTHER): Payer: Medicare Other

## 2015-11-25 ENCOUNTER — Encounter: Payer: Self-pay | Admitting: Internal Medicine

## 2015-11-25 VITALS — BP 136/84 | HR 59 | Temp 98.2°F | Resp 20 | Wt 177.0 lb

## 2015-11-25 DIAGNOSIS — I2510 Atherosclerotic heart disease of native coronary artery without angina pectoris: Secondary | ICD-10-CM

## 2015-11-25 DIAGNOSIS — K589 Irritable bowel syndrome without diarrhea: Secondary | ICD-10-CM

## 2015-11-25 DIAGNOSIS — R10813 Right lower quadrant abdominal tenderness: Secondary | ICD-10-CM

## 2015-11-25 DIAGNOSIS — E785 Hyperlipidemia, unspecified: Secondary | ICD-10-CM

## 2015-11-25 DIAGNOSIS — Z0000 Encounter for general adult medical examination without abnormal findings: Secondary | ICD-10-CM | POA: Diagnosis not present

## 2015-11-25 DIAGNOSIS — I10 Essential (primary) hypertension: Secondary | ICD-10-CM | POA: Diagnosis not present

## 2015-11-25 DIAGNOSIS — F411 Generalized anxiety disorder: Secondary | ICD-10-CM

## 2015-11-25 LAB — COMPREHENSIVE METABOLIC PANEL
ALK PHOS: 148 U/L — AB (ref 39–117)
ALT: 22 U/L (ref 0–35)
AST: 21 U/L (ref 0–37)
Albumin: 4 g/dL (ref 3.5–5.2)
BILIRUBIN TOTAL: 0.8 mg/dL (ref 0.2–1.2)
BUN: 19 mg/dL (ref 6–23)
CALCIUM: 9.2 mg/dL (ref 8.4–10.5)
CO2: 30 mEq/L (ref 19–32)
CREATININE: 0.77 mg/dL (ref 0.40–1.20)
Chloride: 105 mEq/L (ref 96–112)
GFR: 77.72 mL/min (ref >60.00)
Glucose, Bld: 111 mg/dL — ABNORMAL HIGH (ref 70–99)
Potassium: 4.5 mEq/L (ref 3.5–5.1)
Sodium: 141 mEq/L (ref 135–145)
TOTAL PROTEIN: 6.9 g/dL (ref 6.0–8.3)

## 2015-11-25 LAB — CBC WITH DIFFERENTIAL/PLATELET
BASOS PCT: 0.3 % (ref 0.0–3.0)
Basophils Absolute: 0 10*3/uL (ref 0.0–0.1)
Eosinophils Absolute: 0.1 10*3/uL (ref 0.0–0.7)
Eosinophils Relative: 1.7 % (ref 0.0–5.0)
HEMATOCRIT: 43.7 % (ref 36.0–46.0)
Hemoglobin: 14.6 g/dL (ref 12.0–15.0)
LYMPHS ABS: 0.8 10*3/uL (ref 0.7–4.0)
LYMPHS PCT: 11.8 % — AB (ref 12.0–46.0)
MCHC: 33.3 g/dL (ref 30.0–36.0)
MCV: 86.8 fl (ref 78.0–100.0)
MONOS PCT: 6.3 % (ref 3.0–12.0)
Monocytes Absolute: 0.4 10*3/uL (ref 0.1–1.0)
NEUTROS ABS: 5.3 10*3/uL (ref 1.4–7.7)
NEUTROS PCT: 79.9 % — AB (ref 43.0–77.0)
PLATELETS: 164 10*3/uL (ref 150.0–400.0)
RBC: 5.03 Mil/uL (ref 3.87–5.11)
RDW: 14.6 % (ref 11.5–15.5)
WBC: 6.6 10*3/uL (ref 4.0–10.5)

## 2015-11-25 LAB — URINALYSIS, ROUTINE W REFLEX MICROSCOPIC
Bilirubin Urine: NEGATIVE
Hgb urine dipstick: NEGATIVE
Ketones, ur: NEGATIVE
Leukocytes, UA: NEGATIVE
Nitrite: NEGATIVE
RBC / HPF: NONE SEEN
Specific Gravity, Urine: 1.01
Total Protein, Urine: NEGATIVE
Urine Glucose: NEGATIVE
Urobilinogen, UA: 0.2
WBC, UA: NONE SEEN
pH: 5.5 (ref 5.0–8.0)

## 2015-11-25 LAB — LIPID PANEL
CHOLESTEROL: 287 mg/dL — AB (ref 0–200)
HDL: 55.9 mg/dL (ref >39.00)
LDL Cholesterol: 211 mg/dL — ABNORMAL HIGH (ref 0–99)
NonHDL: 230.96
TRIGLYCERIDES: 101 mg/dL (ref 0.0–149.0)
Total CHOL/HDL Ratio: 5
VLDL: 20.2 mg/dL (ref 0.0–40.0)

## 2015-11-25 LAB — AMYLASE: Amylase: 34 U/L (ref 27–131)

## 2015-11-25 LAB — TSH: TSH: 3.84 u[IU]/mL (ref 0.35–4.50)

## 2015-11-25 MED ORDER — POTASSIUM CHLORIDE CRYS ER 20 MEQ PO TBCR: 20.0000 meq | EXTENDED_RELEASE_TABLET | Freq: Two times a day (BID) | ORAL | Status: DC

## 2015-11-25 MED ORDER — ALPRAZOLAM 0.5 MG PO TABS: 0.5000 mg | ORAL_TABLET | Freq: Three times a day (TID) | ORAL | Status: DC | PRN

## 2015-11-25 MED ORDER — ELUXADOLINE 75 MG PO TABS
1.0000 | ORAL_TABLET | Freq: Two times a day (BID) | ORAL | Status: DC
Start: 2015-11-25 — End: 2015-12-01

## 2015-11-25 NOTE — Progress Notes (Signed)
Subjective:  Patient ID: [name], female    DOB: 09/07/1940  Age: 75 y.o. MRN: [id number]  CC: Abdominal Pain and Annual Exam   HPI [name] presents for a CPX.  She complains of chronic, recurrent episodes of RLQ abdominal pain. This has been occurring for about 5 or 10 years. She describes being admitted to the hospital about 5 years ago for a workup that was unrevealing. She complains that the pain is intermittent/achy and is associated with frequent and urgent bowel movements. She has been told before that she has irritable bowel syndrome. It has been treated with the occasional dose of Imodium A-D. She has about 4-5 urgent and loose bowel movements a day. She is status post hysterectomy, cholecystectomy, and oophorectomy.  She was recently seen for a cough. She thinks that she was told that she has pneumonia and took a course of levofloxacin and prednisone. The cough has resolved and she feels much better but she is concerned that she needs to have another chest x-ray performed.   Past Medical History  Diagnosis Date  . Unspecified essential hypertension   . CAD (coronary artery disease)   . Other and unspecified hyperlipidemia   . Obesity, unspecified   . Diverticulosis of colon (without mention of hemorrhage)   . Other chronic nonalcoholic liver disease   . Fibromyalgia   . Disorder of bone and cartilage, unspecified   . Dizziness and giddiness   . Anxiety state, unspecified   . Fatty liver   . Skin cancer   . TIA (transient ischemic attack)    Past Surgical History  Procedure Laterality Date  . Tonsillectomy and adenoidectomy    . Total abdominal hysterectomy w/ bilateral salpingoophorectomy    . Cholecystectomy    . Back surgery    . Mohs surgery      reports that she has never smoked. She has never used smokeless tobacco. She reports that she does not drink alcohol or use illicit drugs. family history includes Breast cancer in her sister; Cancer in her  brother; Stomach cancer in her mother. Allergies  Allergen Reactions  . Diltiazem Hcl Other (See Comments)    REACTION: pt states it made her dizzy...  . Statins     Muscle aches  . Zetia [Ezetimibe]     "it made me feel bad"  . Codeine Nausea Only  . Sulfonamide Derivatives Hives, Itching and Rash  . Tape Itching  . Tramadol Itching    Outpatient Prescriptions Prior to Visit  Medication Sig Dispense Refill  . Brinzolamide-Brimonidine (SIMBRINZA) 1-0.2 % SUSP Apply to eye 2 (two) times daily.    . furosemide (LASIX) 20 MG tablet Take 1 tablet (20 mg total) by mouth daily. Physical due in June must see md for refills 30 tablet 0  . nitroGLYCERIN (NITROSTAT) 0.4 MG SL tablet Place 1 tablet (0.4 mg total) under the tongue every 5 (five) minutes as needed for chest pain. 60 tablet 5  . ALPRAZolam (XANAX) 0.5 MG tablet TAKE ONE TABLET BY MOUTH THREE TIMES DAILY AS NEEDED 90 tablet 2  . potassium chloride SA (K-DUR,KLOR-CON) 20 MEQ tablet Take 1 tablet (20 mEq total) by mouth 2 (two) times daily. 180 tablet 3  . benzonatate (TESSALON PERLES) 100 MG capsule 1-2 tab by mouth every 8 hrs as needed for cough (Patient not taking: Reported on 11/25/2015) 50 capsule 0  . levofloxacin (LEVAQUIN) 500 MG tablet Take 1 tablet (500 mg total) by mouth daily. (Patient  not taking: Reported on 11/25/2015) 10 tablet 0  . loperamide (IMODIUM A-D) 2 MG tablet Take 1 tablet (2 mg total) by mouth 4 (four) times daily as needed for diarrhea or loose stools. (Patient not taking: Reported on 11/25/2015) 30 tablet 0  . predniSONE (DELTASONE) 10 MG tablet 3 tabs by mouth per day for 3 days,2tabs per day for 3 days,1tab per day for 3 days (Patient not taking: Reported on 11/25/2015) 18 tablet 0   No facility-administered medications prior to visit.    ROS Review of Systems  Constitutional: Negative.  Negative for fever, chills, diaphoresis, activity change, appetite change, fatigue and unexpected weight change.  HENT:  Negative.   Eyes: Negative.   Respiratory: Negative.  Negative for cough, choking, chest tightness, shortness of breath and stridor.   Cardiovascular: Negative.  Negative for chest pain, palpitations and leg swelling.  Gastrointestinal: Positive for abdominal pain and diarrhea. Negative for nausea, vomiting, constipation and blood in stool.  Endocrine: Negative.   Genitourinary: Negative.  Negative for dysuria, urgency, hematuria, flank pain, decreased urine volume and difficulty urinating.  Musculoskeletal: Negative.  Negative for myalgias, back pain and arthralgias.  Skin: Negative.  Negative for color change, pallor and rash.  Allergic/Immunologic: Negative.   Neurological: Negative.  Negative for dizziness, tremors, syncope, light-headedness, numbness and headaches.  Hematological: Negative.  Negative for adenopathy. Does not bruise/bleed easily.  Psychiatric/Behavioral: Negative for suicidal ideas, hallucinations, behavioral problems, confusion, sleep disturbance, self-injury, dysphoric mood and decreased concentration. The patient is nervous/anxious.     Objective:  BP 136/84 mmHg  Pulse 59  Temp(Src) 98.2 F (36.8 C) (Oral)  Resp 20  Wt 177 lb (80.287 kg)  SpO2 97%  BP Readings from Last 3 Encounters:  11/25/15 136/84  11/10/15 122/76  03/31/15 122/80    Wt Readings from Last 3 Encounters:  11/25/15 177 lb (80.287 kg)  11/10/15 177 lb (80.287 kg)  03/31/15 174 lb (78.926 kg)    Physical Exam  Constitutional: She is oriented to person, place, and time. She appears well-developed and well-nourished.  Non-toxic appearance. She does not have a sickly appearance. She does not appear ill. No distress.  HENT:  Mouth/Throat: Oropharynx is clear and moist.  Eyes: Conjunctivae are normal. Right eye exhibits no discharge. Left eye exhibits no discharge. No scleral icterus.  Neck: Normal range of motion. Neck supple. No JVD present. No tracheal deviation present. No thyromegaly  present.  Cardiovascular: Normal rate, regular rhythm, normal heart sounds and intact distal pulses.  Exam reveals no gallop and no friction rub.   No murmur heard. Pulmonary/Chest: Effort normal and breath sounds normal. No stridor. No respiratory distress. She has no wheezes. She has no rales. She exhibits no tenderness.  Abdominal: Soft. Bowel sounds are normal. She exhibits no distension and no mass. There is no tenderness. There is no rebound and no guarding.  Musculoskeletal: Normal range of motion. She exhibits no edema or tenderness.  Lymphadenopathy:    She has no cervical adenopathy.  Neurological: She is oriented to person, place, and time.  Skin: Skin is warm and dry. No rash noted. She is not diaphoretic. No erythema. No pallor.  Psychiatric: She has a normal mood and affect. Her behavior is normal. Judgment and thought content normal.  Vitals reviewed.   Lab Results  Component Value Date   WBC 6.6 11/25/2015   HGB 14.6 11/25/2015   HCT 43.7 11/25/2015   PLT 164.0 11/25/2015   GLUCOSE 111* 11/25/2015   CHOL 287*  11/25/2015   TRIG 101.0 11/25/2015   HDL 55.90 11/25/2015   LDLDIRECT 159.0 12/02/2014   LDLCALC 211* 11/25/2015   ALT 22 11/25/2015   AST 21 11/25/2015   NA 141 11/25/2015   K 4.5 11/25/2015   CL 105 11/25/2015   CREATININE 0.77 11/25/2015   BUN 19 11/25/2015   CO2 30 11/25/2015   TSH 3.84 11/25/2015   INR 1.0 RATIO 04/28/2008   HGBA1C 5.1 07/06/2012    Dg Chest 2 View  11/11/2015  CLINICAL DATA:  Four days of fever associated with cough; previous episodes of pneumonia ; history of coronary artery disease; nonsmoker. EXAM: CHEST  2 VIEW COMPARISON:  PA and lateral chest x-ray of Nov 04, 2013 FINDINGS: The lungs are adequately inflated. The interstitial markings are mildly increased bilaterally. There is no alveolar infiltrate. There is no pleural effusion. The heart and pulmonary vascularity are normal. The mediastinum is normal in width. The bony thorax  exhibits no acute abnormality. IMPRESSION: New mild interstitial prominence bilaterally that suggests pneumonitis or bronchitis. There is no alveolar pneumonia. Followup PA and lateral chest X-ray is recommended in 3-4 weeks following trial of antibiotic therapy to ensure resolution and exclude underlying malignancy. Electronically Signed   By: David  Martinique M.D.   On: 11/11/2015 08:58   Dg Chest 2 View  11/11/2015  CLINICAL DATA:  Four days of fever associated with cough; previous episodes of pneumonia ; history of coronary artery disease; nonsmoker. EXAM: CHEST  2 VIEW COMPARISON:  PA and lateral chest x-ray of Nov 04, 2013 FINDINGS: The lungs are adequately inflated. The interstitial markings are mildly increased bilaterally. There is no alveolar infiltrate. There is no pleural effusion. The heart and pulmonary vascularity are normal. The mediastinum is normal in width. The bony thorax exhibits no acute abnormality. IMPRESSION: New mild interstitial prominence bilaterally that suggests pneumonitis or bronchitis. There is no alveolar pneumonia. Followup PA and lateral chest X-ray is recommended in 3-4 weeks following trial of antibiotic therapy to ensure resolution and exclude underlying malignancy. Electronically Signed   By: David  Martinique M.D.   On: 11/11/2015 08:58   Dg Abd Acute W/chest  11/25/2015  CLINICAL DATA:  Right lower quadrant pain since yesterday EXAM: DG ABDOMEN ACUTE W/ 1V CHEST COMPARISON:  Chest x-ray Nov 11, 2015 FINDINGS: There is no evidence of dilated bowel loops or free intraperitoneal air. No radiopaque calculi or other significant radiographic abnormality is seen. Heart size and mediastinal contours are within normal limits. There is no focal infiltrate, pulmonary edema, or pleural effusion. Mild scar is identified in the left lung base. Prior cholecystectomy clips are noted. There is scoliosis of spine. IMPRESSION: Negative abdominal radiographs.  No acute cardiopulmonary disease.  Electronically Signed   By: Abelardo Diesel M.D.   On: 11/25/2015 14:42   Assessment & Plan:   Mariadelosang was seen today for abdominal pain and annual exam.  Diagnoses and all orders for this visit:  Coronary artery disease involving native coronary artery of native heart without angina pectoris- she's had no recent episodes of chest pain or shortness of breath, she will not take a statin or Zetia, will continue to monitor her other risk factors with blood pressure control -     Lipid panel; Future  Essential hypertension- her blood pressure is well-controlled, electrolytes and renal function are stable. -     Comprehensive metabolic panel; Future -     CBC with Differential/Platelet; Future -     Urinalysis, Routine w reflex  microscopic (not at [facility]); Future -     potassium chloride SA (K-DUR,KLOR-CON) 20 MEQ tablet; Take 1 tablet (20 mEq total) by mouth 2 (two) times daily.  Hyperlipidemia with target LDL less than 100- her LDL is very high but she really relates today that she is not willing to take a statin or Zetia, I will refer to cardiology to see if she is a candidate for PCS K9 inhibitor. -     Lipid panel; Future -     TSH; Future  Abdominal tenderness, RLQ (right lower quadrant)- plains films of the abdomen are normal, her labs are unremarkable, this is consistent with irritable bowel syndrome. -     Amylase; Future -     Urinalysis, Routine w reflex microscopic (not at [facility]); Future -     DG Abd Acute W/Chest; Future  IBS (irritable bowel syndrome)- I think she would benefit from trying Viberzi, I gave her samples and a prescription. -     Eluxadoline (VIBERZI) 75 MG TABS; Take 1 tablet by mouth 2 (two) times daily.  GAD (generalized anxiety disorder) -     ALPRAZolam (XANAX) 0.5 MG tablet; Take 1 tablet (0.5 mg total) by mouth 3 (three) times daily as needed.   I have discontinued Ms. Eroh's loperamide, levofloxacin, benzonatate, and predniSONE. I have also changed her  ALPRAZolam. Additionally, I am having her start on Eluxadoline. Lastly, I am having her maintain her Brinzolamide-Brimonidine, nitroGLYCERIN, furosemide, and potassium chloride SA.  Meds ordered this encounter  Medications  . Eluxadoline (VIBERZI) 75 MG TABS    Sig: Take 1 tablet by mouth 2 (two) times daily.    Dispense:  60 tablet    Refill:  5  . potassium chloride SA (K-DUR,KLOR-CON) 20 MEQ tablet    Sig: Take 1 tablet (20 mEq total) by mouth 2 (two) times daily.    Dispense:  180 tablet    Refill:  1  . ALPRAZolam (XANAX) 0.5 MG tablet    Sig: Take 1 tablet (0.5 mg total) by mouth 3 (three) times daily as needed.    Dispense:  90 tablet    Refill:  2   See AVS for instructions about healthy living and anticipatory guidance.  Follow-up: Return in about 3 weeks (around 12/16/2015).  Scarlette Calico, MD

## 2015-11-25 NOTE — Patient Instructions (Signed)

## 2015-11-29 DIAGNOSIS — Z0000 Encounter for general adult medical examination without abnormal findings: Secondary | ICD-10-CM | POA: Insufficient documentation

## 2015-11-29 NOTE — Assessment & Plan Note (Signed)

## 2015-12-01 ENCOUNTER — Encounter: Payer: Self-pay | Admitting: Cardiology

## 2015-12-01 ENCOUNTER — Ambulatory Visit (INDEPENDENT_AMBULATORY_CARE_PROVIDER_SITE_OTHER): Payer: Medicare Other | Admitting: Cardiology

## 2015-12-01 VITALS — BP 142/66 | HR 76 | Ht 63.0 in | Wt 172.4 lb

## 2015-12-01 DIAGNOSIS — I10 Essential (primary) hypertension: Secondary | ICD-10-CM

## 2015-12-01 NOTE — Progress Notes (Signed)
Cardiology Office Note   Date:  12/01/2015   ID:  [name], DOB [birth year], MRN [id number]  PCP:  Tanya Calico, MD  Cardiologist:  Cashawn Yanko Meredith Leeds, MD    Chief Complaint  Patient presents with  . New Patient (Initial Visit)     History of Present Illness: [name] is a 75 y.o. female who presents today for cardiology evaluation.   She has a history of hypertension, coronary disease status post cath in 1996 showing 20-30% lesion in the circumflex and RCA, history of syncope with no etiology discovered, hyperlipidemia intolerant to statins and zetia. She presents today for workup of her hyperlipidemia. She says that she had tried multiple medications. She felt bad when she was on the zetia and has refused to take. She says that she has had muscle aches on multiple doses of statins. She initially was tolerating Zocor, but had taken herself off of it as she said that it made her gain weight.   Today, she denies symptoms of palpitations, chest pain, shortness of breath, orthopnea, PND, lower extremity edema, claudication, dizziness, presyncope, syncope, bleeding, or neurologic sequela. The patient is tolerating medications without difficulties and is otherwise without complaint today.    Past Medical History  Diagnosis Date  . Unspecified essential hypertension   . CAD (coronary artery disease)   . Other and unspecified hyperlipidemia   . Obesity, unspecified   . Diverticulosis of colon (without mention of hemorrhage)   . Other chronic nonalcoholic liver disease   . Fibromyalgia   . Disorder of bone and cartilage, unspecified   . Dizziness and giddiness   . Anxiety state, unspecified   . Fatty liver   . Skin cancer   . TIA (transient ischemic attack)    Past Surgical History  Procedure Laterality Date  . Tonsillectomy and adenoidectomy    . Total abdominal hysterectomy w/ bilateral salpingoophorectomy    . Cholecystectomy    . Back surgery    . Mohs surgery         Current Outpatient Prescriptions  Medication Sig Dispense Refill  . ALPRAZolam (XANAX) 0.5 MG tablet Take 1 tablet (0.5 mg total) by mouth 3 (three) times daily as needed. 90 tablet 2  . Brimonidine Tartrate (ALPHAGAN P OP) Apply 1 drop to eye 2 (two) times daily.    . furosemide (LASIX) 20 MG tablet Take 1 tablet (20 mg total) by mouth daily. Physical due in June must see md for refills 30 tablet 0  . nitroGLYCERIN (NITROSTAT) 0.4 MG SL tablet Place 1 tablet (0.4 mg total) under the tongue every 5 (five) minutes as needed for chest pain. 60 tablet 5  . potassium chloride SA (K-DUR,KLOR-CON) 20 MEQ tablet Take 1 tablet (20 mEq total) by mouth 2 (two) times daily. 180 tablet 1   No current facility-administered medications for this visit.    Allergies:   Diltiazem hcl; Statins; Zetia; Codeine; Sulfonamide derivatives; Tape; and Tramadol   Social History:  The patient  reports that she has never smoked. She has never used smokeless tobacco. She reports that she does not drink alcohol or use illicit drugs.   Family History:  The patient's family history includes Breast cancer in her sister and sister; Cancer in her brother; Stomach cancer in her mother.    ROS:  Please see the history of present illness.   Otherwise, review of systems is positive for abdominal pain, diarrhea, snoring, back pain, easy bruising.   All other systems  are reviewed and negative.    PHYSICAL EXAM: VS:  BP 142/66 mmHg  Pulse 76  Ht 5\' 3"  (1.6 m)  Wt 172 lb 6.4 oz (78.2 kg)  BMI 30.55 kg/m2 , BMI Body mass index is 30.55 kg/(m^2). GEN: Well nourished, well developed, in no acute distress HEENT: normal Neck: no JVD, carotid bruits, or masses Cardiac: RRR; no murmurs, rubs, or gallops,no edema  Respiratory:  clear to auscultation bilaterally, normal work of breathing GI: soft, nontender, nondistended, + BS MS: no deformity or atrophy Skin: warm and dry Neuro:  Strength and sensation are intact Psych:  euthymic mood, full affect  EKG:  EKG is ordered today. The ekg ordered today shows sinus rhythm, rate 76, PRWP  Recent Labs: 11/25/2015: ALT 22; BUN 19; Creatinine, Ser 0.77; Hemoglobin 14.6; Platelets 164.0; Potassium 4.5; Sodium 141; TSH 3.84    Lipid Panel     Component Value Date/Time   CHOL 287* 11/25/2015 1030   TRIG 101.0 11/25/2015 1030   HDL 55.90 11/25/2015 1030   CHOLHDL 5 11/25/2015 1030   VLDL 20.2 11/25/2015 1030   LDLCALC 211* 11/25/2015 1030   LDLDIRECT 159.0 12/02/2014 1702     Wt Readings from Last 3 Encounters:  12/01/15 172 lb 6.4 oz (78.2 kg)  11/25/15 177 lb (80.287 kg)  11/10/15 177 lb (80.287 kg)      Other studies Reviewed: Additional studies/ records that were reviewed today include: TTE 2013 - Left ventricle: The cavity size was normal. Wall thickness was increased in a pattern of mild LVH. The estimated ejection fraction was 60%. Wall motion was normal; there were no regional wall motion abnormalities.   ASSESSMENT AND PLAN:  1.  CAD: refuses to take statin or zetia. We'll have her follow-up in lipid clinic for potential PVCS K9 inhibitor. She has had her lipids checked recently that showed an LDL which was elevated at 211.  2. Hypertension: Elevated today. We'll have her check home blood pressures and report back.  3. Hyperlipidemia: Referral to lipid clinic today.    Current medicines are reviewed at length with the patient today.   The patient does not have concerns regarding her medicines.  The following changes were made today:  none  Labs/ tests ordered today include:  No orders of the defined types were placed in this encounter.     Disposition:   FU with [facility] 1 years  Signed, Shanira Tine Meredith Leeds, MD  12/01/2015 3:11 PM     [address] [place] [address] [phone] (office) [phone] (fax)

## 2015-12-01 NOTE — Patient Instructions (Addendum)
Medication Instructions:  Your physician recommends that you continue on your current medications as directed. Please refer to the Current Medication list given to you today.  Labwork: None ordered  Testing/Procedures: None ordered  Follow-Up: Your physician recommends that you schedule a follow-up appointment with [place] physician wants you to follow-up in: one year with a general cardiologist. You will receive a reminder letter in the mail two months in advance. If you don't receive a letter, please call our office to schedule the follow-up appointment.  If you need a refill on your cardiac medications before your next appointment, please call your pharmacy.  Thank you for choosing [facility]!!

## 2015-12-05 ENCOUNTER — Other Ambulatory Visit: Payer: Self-pay | Admitting: Internal Medicine

## 2015-12-07 NOTE — Telephone Encounter (Signed)
Faxed script back to [facility]...[name]

## 2015-12-16 ENCOUNTER — Ambulatory Visit: Payer: Medicare Other | Admitting: Internal Medicine

## 2015-12-16 ENCOUNTER — Encounter: Payer: Medicare Other | Admitting: Pharmacist

## 2015-12-16 NOTE — Progress Notes (Signed)
Patient ID: [name]                 DOB: 05-07-1941                    MRN: [id number]     HPI: [name] is a 75 y.o. female patient referred to lipid clinic by Dr. Curt Bears.  She has a history of CAD, TIA, HTN and HLD.  She was referred due to intolerance to statins and Zetia.  She has tried Lipitor, Crestor, and simvastatin.  She was tolerating simvastatin but stopped because she was afraid it made her gain weight.   Current Medications: none Intolerances: Lipitor 20mg  (06/2011-12/2011), Zetia 10mg  (01/2011-06/2011), Crestor 10mg  (unknown start-01/2011), Crestor 5mg  (date unknown), Crestor 20mg  (date unknown), simvastatin 20mg  (08/2013) Risk Factors: CAD, HTN, TIA LDL goal: <70 mg/dL  Diet:   Exercise:   Family History:   Social History:   Labs: 11/25/15- TC 287, TG 101, HDL 56, LDL 221, LFTS normal (on no therapy)  Past Medical History  Diagnosis Date  . Unspecified essential hypertension   . CAD (coronary artery disease)   . Other and unspecified hyperlipidemia   . Obesity, unspecified   . Diverticulosis of colon (without mention of hemorrhage)   . Other chronic nonalcoholic liver disease   . Fibromyalgia   . Disorder of bone and cartilage, unspecified   . Dizziness and giddiness   . Anxiety state, unspecified   . Fatty liver   . Skin cancer   . TIA (transient ischemic attack)     Current Outpatient Prescriptions on File Prior to Visit  Medication Sig Dispense Refill  . ALPRAZolam (XANAX) 0.5 MG tablet TAKE ONE TABLET BY MOUTH THREE TIMES DAILY AS NEEDED 90 tablet 2  . Brimonidine Tartrate (ALPHAGAN P OP) Apply 1 drop to eye 2 (two) times daily.    . furosemide (LASIX) 20 MG tablet Take 1 tablet (20 mg total) by mouth daily. Physical due in June must see md for refills 30 tablet 0  . nitroGLYCERIN (NITROSTAT) 0.4 MG SL tablet Place 1 tablet (0.4 mg total) under the tongue every 5 (five) minutes as needed for chest pain. 60 tablet 5  . potassium chloride SA  (K-DUR,KLOR-CON) 20 MEQ tablet Take 1 tablet (20 mEq total) by mouth 2 (two) times daily. 180 tablet 1   No current facility-administered medications on file prior to visit.    Allergies  Allergen Reactions  . Diltiazem Hcl Other (See Comments)    REACTION: pt states it made her dizzy...  . Statins     Muscle aches  . Zetia [Ezetimibe]     "it made me feel bad"  . Codeine Nausea Only  . Sulfonamide Derivatives Hives, Itching and Rash  . Tape Itching  . Tramadol Itching    Assessment/Plan:      This encounter was created in error - please disregard.

## 2016-01-28 ENCOUNTER — Other Ambulatory Visit: Payer: Self-pay | Admitting: *Deleted

## 2016-01-28 DIAGNOSIS — I10 Essential (primary) hypertension: Secondary | ICD-10-CM

## 2016-01-28 MED ORDER — POTASSIUM CHLORIDE CRYS ER 20 MEQ PO TBCR: 20.0000 meq | EXTENDED_RELEASE_TABLET | Freq: Two times a day (BID) | ORAL | 1 refills | Status: DC

## 2016-03-02 ENCOUNTER — Telehealth: Payer: Self-pay | Admitting: Internal Medicine

## 2016-03-02 NOTE — Telephone Encounter (Signed)
Patient Name: [name]  DOB: 02/24/1941    Initial Comment Caller states last night she had a headache, jaw pain, they called 911. She wasn't transported. She isn't having any symptoms right now   Nurse Assessment  Nurse: Raphael Gibney, RN, Vanita Ingles Date/Time Eilene Ghazi Time): 03/02/2016 2:58:27 PM  Confirm and document reason for call. If symptomatic, describe symptoms. You must click the next button to save text entered. ---Caller states she called 911 because she had a headache, jaw pain. She was vomiting. BP was high. She was advised to go to the ER but did not want to go. No vomiting, no jaw pain and no headache today. She is drinking and eating ok. BP 157/78 now.  Has the patient traveled out of the country within the last 30 days? ---Not Applicable  Does the patient have any new or worsening symptoms? ---Yes  Will a triage be completed? ---Yes  Related visit to physician within the last 2 weeks? ---No  Does the PT have any chronic conditions? (i.e. diabetes, asthma, etc.) ---No  Is this a behavioral health or substance abuse call? ---No     Guidelines    Guideline Title Affirmed Question Affirmed Notes  High Blood Pressure Wants doctor to measure BP    Final Disposition User   See PCP within 2 Maudry Diego, RN, Vanita Ingles    Comments  pt states she already has an appt on 03/04/16 at 9:30 am with Dr. Scarlette Calico.   Disagree/Comply: Comply

## 2016-03-04 ENCOUNTER — Ambulatory Visit (INDEPENDENT_AMBULATORY_CARE_PROVIDER_SITE_OTHER): Payer: Medicare Other | Admitting: Internal Medicine

## 2016-03-04 ENCOUNTER — Encounter: Payer: Self-pay | Admitting: Internal Medicine

## 2016-03-04 VITALS — BP 140/82 | HR 82 | Temp 98.3°F | Resp 20 | Wt 177.0 lb

## 2016-03-04 DIAGNOSIS — Z23 Encounter for immunization: Secondary | ICD-10-CM

## 2016-03-04 DIAGNOSIS — F411 Generalized anxiety disorder: Secondary | ICD-10-CM

## 2016-03-04 DIAGNOSIS — R519 Headache, unspecified: Secondary | ICD-10-CM

## 2016-03-04 DIAGNOSIS — I10 Essential (primary) hypertension: Secondary | ICD-10-CM | POA: Diagnosis not present

## 2016-03-04 DIAGNOSIS — R51 Headache: Secondary | ICD-10-CM | POA: Diagnosis not present

## 2016-03-04 NOTE — Patient Instructions (Addendum)
You had the flu shot today  Please return in 2 weeks for a Nurse Visit appt in 2 wks for the Prevnar 13 pneumonia shot  Please continue all other medications as before, and refills have been done if requested.  Please have the pharmacy call with any other refills you may need.  Please keep your appointments with your specialists as you may have planned

## 2016-03-04 NOTE — Progress Notes (Signed)
Subjective:    Patient ID: [name], female    DOB: [birth year], 75 y.o.   MRN: [id number]   HPI Here after episode recentlyt, lives with husband, but daughters called EMS the evening of sept 12 due to sudden onset severe right side HA x 3 hrs with radiation to the right jaw, and whole body shaking, not assoc with throbbing, photophobia or phonophobia but did have n/v x3, also took childrens ibuprofen liquid, and essentially resolved after 3 hrs, and able to go to sleep after, no HA since then. Pt denies new neurological symptoms such as new facial or extremity weakness or numbness  ECG done per EMS - no acute changes per pt report.  And BP was elevated, and advised to go to ER but she declined, This was first chance to come in today. No further HA's.   Pt denies fever, wt loss, night sweats, loss of appetite, or other constitutional symptoms Pt denies chest pain, increased sob or doe, wheezing, orthopnea, PND, increased LE swelling, palpitations, dizziness or syncope.   Pt denies polydipsia, polyuria, and sugar per EMS normal.  BP back to "normal" yesterday, 124/58.  In retrospect, event was triggered over a lie about her that hurt a grandchild feeling, all caused per pt by her middle daughter who has always been a "troublemaker"  Due for flu shots and prevnar 13 Past Medical History:  Diagnosis Date  . Anxiety state, unspecified   . CAD (coronary artery disease)   . Disorder of bone and cartilage, unspecified   . Diverticulosis of colon (without mention of hemorrhage)   . Dizziness and giddiness   . Fatty liver   . Fibromyalgia   . Obesity, unspecified   . Other and unspecified hyperlipidemia   . Other chronic nonalcoholic liver disease   . Skin cancer   . TIA (transient ischemic attack)   . Unspecified essential hypertension    Past Surgical History:  Procedure Laterality Date  . BACK SURGERY    . CHOLECYSTECTOMY    . MOHS SURGERY    . TONSILLECTOMY AND ADENOIDECTOMY    . TOTAL  ABDOMINAL HYSTERECTOMY W/ BILATERAL SALPINGOOPHORECTOMY      reports that she has never smoked. She has never used smokeless tobacco. She reports that she does not drink alcohol or use drugs. family history includes Breast cancer in her sister and sister; Cancer in her brother; Stomach cancer in her mother. Allergies  Allergen Reactions  . Diltiazem Hcl Other (See Comments)    REACTION: pt states it made her dizzy...  . Statins     Muscle aches  . Zetia [Ezetimibe]     "it made me feel bad"  . Codeine Nausea Only  . Sulfonamide Derivatives Hives, Itching and Rash  . Tape Itching  . Tramadol Itching   Current Outpatient Prescriptions on File Prior to Visit  Medication Sig Dispense Refill  . ALPRAZolam (XANAX) 0.5 MG tablet TAKE ONE TABLET BY MOUTH THREE TIMES DAILY AS NEEDED 90 tablet 2  . Brimonidine Tartrate (ALPHAGAN P OP) Apply 1 drop to eye 2 (two) times daily.    . furosemide (LASIX) 20 MG tablet Take 1 tablet (20 mg total) by mouth daily. Physical due in June must see md for refills 30 tablet 0  . nitroGLYCERIN (NITROSTAT) 0.4 MG SL tablet Place 1 tablet (0.4 mg total) under the tongue every 5 (five) minutes as needed for chest pain. 60 tablet 5  . potassium chloride SA (K-DUR,KLOR-CON) 20 MEQ tablet  Take 1 tablet (20 mEq total) by mouth 2 (two) times daily. 180 tablet 1   No current facility-administered medications on file prior to visit.    Review of Systems  Constitutional: Negative for unusual diaphoresis or night sweats HENT: Negative for ear swelling or discharge Eyes: Negative for worsening visual haziness  Respiratory: Negative for choking and stridor.   Gastrointestinal: Negative for distension or worsening eructation Genitourinary: Negative for retention or change in urine volume.  Musculoskeletal: Negative for other MSK pain or swelling Skin: Negative for color change and worsening wound Neurological: Negative for tremors and numbness other than noted    Psychiatric/Behavioral: Negative for decreased concentration or agitation other than above       Objective:   Physical Exam BP 140/82   Pulse 82   Temp 98.3 F (36.8 C) (Oral)   Resp 20   Wt 177 lb (80.3 kg)   SpO2 96%   BMI 31.35 kg/m  VS noted,  Constitutional: Pt appears in no apparent distress HENT: Head: NCAT.  Right Ear: External ear normal.  Left Ear: External ear normal.  Eyes: . Pupils are equal, round, and reactive to light. Conjunctivae and EOM are normal Neck: Normal range of motion. Neck supple.  Cardiovascular: Normal rate and regular rhythm.   Pulmonary/Chest: Effort normal and breath sounds without rales or wheezing.  Abd:  Soft, NT, ND, + BS Neurological: Pt is alert. Not confused , motor 5/5 intact, cn 2-12 intact Skin: Skin is warm. No rash, no LE edema Psychiatric: Pt behavior is normal. No agitation.   MRI HEAD WITHOUT CONTRAST MRA HEAD WITHOUT CONTRAST IMPRESSION: 07/14/2011 No significant intracranial abnormality.  Normal variant anatomy in the circle of Willis.  3=-9-15 stress test - neg for ischemia  Lab Results  Component Value Date   WBC 6.6 11/25/2015   HGB 14.6 11/25/2015   HCT 43.7 11/25/2015   PLT 164.0 11/25/2015   GLUCOSE 111 (H) 11/25/2015   CHOL 287 (H) 11/25/2015   TRIG 101.0 11/25/2015   HDL 55.90 11/25/2015   LDLDIRECT 159.0 12/02/2014   LDLCALC 211 (H) 11/25/2015   ALT 22 11/25/2015   AST 21 11/25/2015   NA 141 11/25/2015   K 4.5 11/25/2015   CL 105 11/25/2015   CREATININE 0.77 11/25/2015   BUN 19 11/25/2015   CO2 30 11/25/2015   TSH 3.84 11/25/2015   INR 1.0 RATIO 04/28/2008   HGBA1C 5.1 07/06/2012        Assessment & Plan:

## 2016-03-04 NOTE — Progress Notes (Signed)
Pre visit review using our clinic review tool, if applicable. No additional management support is needed unless otherwise documented below in the visit note. 

## 2016-03-05 DIAGNOSIS — R519 Headache, unspecified: Secondary | ICD-10-CM | POA: Insufficient documentation

## 2016-03-05 DIAGNOSIS — R51 Headache: Secondary | ICD-10-CM

## 2016-03-05 NOTE — Assessment & Plan Note (Signed)
stable overall by history and exam, recent data reviewed with pt, and pt to continue medical treatment as before,  to f/u any worsening symptoms or concerns BP Readings from Last 3 Encounters:  03/04/16 140/82  12/01/15 (!) 142/66  11/25/15 136/84

## 2016-03-05 NOTE — Assessment & Plan Note (Signed)
With acute worsening recent with HA, cont xanax prn, declines family counseling as she says her daughter would not come

## 2016-03-05 NOTE — Assessment & Plan Note (Signed)
Severer episode likely migraine acute secondary to acute emotional stressor, exam benign, no further headache, will cont to follow  Note:  Total time for pt hx, exam, review of record with pt in the room, determination of diagnoses and plan for further eval and tx is > 40 min, with over 50% spent in coordination and counseling of patient

## 2016-03-18 ENCOUNTER — Ambulatory Visit: Payer: Medicare Other

## 2016-03-18 ENCOUNTER — Ambulatory Visit (INDEPENDENT_AMBULATORY_CARE_PROVIDER_SITE_OTHER): Payer: Medicare Other

## 2016-03-18 DIAGNOSIS — Z23 Encounter for immunization: Secondary | ICD-10-CM | POA: Diagnosis not present

## 2016-04-23 ENCOUNTER — Other Ambulatory Visit: Payer: Self-pay | Admitting: Internal Medicine

## 2016-05-04 ENCOUNTER — Ambulatory Visit (INDEPENDENT_AMBULATORY_CARE_PROVIDER_SITE_OTHER): Payer: Medicare Other | Admitting: Internal Medicine

## 2016-05-04 ENCOUNTER — Encounter: Payer: Self-pay | Admitting: Internal Medicine

## 2016-05-04 VITALS — BP 130/88 | HR 75 | Temp 98.1°F | Resp 16 | Ht 63.0 in | Wt 178.2 lb

## 2016-05-04 DIAGNOSIS — D510 Vitamin B12 deficiency anemia due to intrinsic factor deficiency: Secondary | ICD-10-CM | POA: Diagnosis not present

## 2016-05-04 DIAGNOSIS — J988 Other specified respiratory disorders: Secondary | ICD-10-CM | POA: Diagnosis not present

## 2016-05-04 MED ORDER — PROMETHAZINE-DM 6.25-15 MG/5ML PO SYRP: 5.0000 mL | ORAL_SOLUTION | Freq: Four times a day (QID) | ORAL | 0 refills | Status: DC | PRN

## 2016-05-04 MED ORDER — AZITHROMYCIN 500 MG PO TABS: 500.0000 mg | ORAL_TABLET | Freq: Every day | ORAL | 0 refills | Status: AC

## 2016-05-04 MED ORDER — CYANOCOBALAMIN 500 MCG/0.1ML NA SOLN: 0.1000 mL | NASAL | 11 refills | Status: DC

## 2016-05-04 MED ORDER — AZITHROMYCIN 500 MG PO TABS: 500.0000 mg | ORAL_TABLET | Freq: Every day | ORAL | 0 refills | Status: DC

## 2016-05-04 NOTE — Progress Notes (Signed)
Subjective:  Patient ID: [name], female    DOB: 1940/10/04  Age: 75 y.o. MRN: [id number]  CC: URI   HPI [name] presents for a 2 day history of cough productive of green phlegm and laryngitis. The cough is so severe that it hurts.  Outpatient Medications Prior to Visit  Medication Sig Dispense Refill  . ALPRAZolam (XANAX) 0.5 MG tablet TAKE ONE TABLET BY MOUTH THREE TIMES DAILY AS NEEDED 90 tablet 2  . Brimonidine Tartrate (ALPHAGAN P OP) Apply 1 drop to eye 2 (two) times daily.    . furosemide (LASIX) 20 MG tablet TAKE ONE TABLET BY MOUTH ONCE DAILY 30 tablet 1  . potassium chloride SA (K-DUR,KLOR-CON) 20 MEQ tablet Take 1 tablet (20 mEq total) by mouth 2 (two) times daily. 180 tablet 1  . nitroGLYCERIN (NITROSTAT) 0.4 MG SL tablet Place 1 tablet (0.4 mg total) under the tongue every 5 (five) minutes as needed for chest pain. (Patient not taking: Reported on 05/04/2016) 60 tablet 5   No facility-administered medications prior to visit.     ROS Review of Systems  Constitutional: Negative for chills, fatigue and fever.  HENT: Positive for voice change. Negative for sore throat and trouble swallowing.   Eyes: Negative.   Respiratory: Positive for cough. Negative for choking, chest tightness, shortness of breath, wheezing and stridor.   Cardiovascular: Negative.  Negative for chest pain, palpitations and leg swelling.  Gastrointestinal: Negative.  Negative for abdominal pain, diarrhea, nausea and vomiting.  Endocrine: Negative.   Genitourinary: Negative.  Negative for difficulty urinating.  Musculoskeletal: Negative.  Negative for arthralgias, back pain, myalgias and neck pain.  Skin: Negative.  Negative for color change and rash.  Neurological: Negative.   Hematological: Negative.  Negative for adenopathy. Does not bruise/bleed easily.  Psychiatric/Behavioral: Negative.     Objective:  BP 130/88 (BP Location: Left Arm, Patient Position: Sitting, Cuff Size: Large)    Pulse 75   Temp 98.1 F (36.7 C) (Oral)   Resp 16   Ht 5\' 3"  (1.6 m)   Wt 178 lb 4 oz (80.9 kg)   SpO2 98%   BMI 31.58 kg/m   BP Readings from Last 3 Encounters:  05/04/16 130/88  03/04/16 140/82  12/01/15 (!) 142/66    Wt Readings from Last 3 Encounters:  05/04/16 178 lb 4 oz (80.9 kg)  03/04/16 177 lb (80.3 kg)  12/01/15 172 lb 6.4 oz (78.2 kg)    Physical Exam  Constitutional: She is oriented to person, place, and time. No distress.  HENT:  Mouth/Throat: Oropharynx is clear and moist. No oropharyngeal exudate.  Eyes: Conjunctivae are normal. Right eye exhibits no discharge. Left eye exhibits no discharge. No scleral icterus.  Neck: Normal range of motion. Neck supple. No JVD present. No tracheal deviation present. No thyromegaly present.  Cardiovascular: Normal rate, regular rhythm, normal heart sounds and intact distal pulses.  Exam reveals no gallop and no friction rub.   No murmur heard. Pulmonary/Chest: Effort normal and breath sounds normal. No stridor. No respiratory distress. She has no wheezes. She has no rales. She exhibits no tenderness.  Abdominal: Soft. Bowel sounds are normal. She exhibits no distension and no mass. There is no tenderness. There is no rebound and no guarding.  Musculoskeletal: Normal range of motion. She exhibits no edema, tenderness or deformity.  Lymphadenopathy:    She has no cervical adenopathy.  Neurological: She is oriented to person, place, and time.  Skin: Skin is warm  and dry. No rash noted. She is not diaphoretic. No erythema. No pallor.  Vitals reviewed.   Lab Results  Component Value Date   WBC 6.6 11/25/2015   HGB 14.6 11/25/2015   HCT 43.7 11/25/2015   PLT 164.0 11/25/2015   GLUCOSE 111 (H) 11/25/2015   CHOL 287 (H) 11/25/2015   TRIG 101.0 11/25/2015   HDL 55.90 11/25/2015   LDLDIRECT 159.0 12/02/2014   LDLCALC 211 (H) 11/25/2015   ALT 22 11/25/2015   AST 21 11/25/2015   NA 141 11/25/2015   K 4.5 11/25/2015   CL  105 11/25/2015   CREATININE 0.77 11/25/2015   BUN 19 11/25/2015   CO2 30 11/25/2015   TSH 3.84 11/25/2015   INR 1.0 RATIO 04/28/2008   HGBA1C 5.1 07/06/2012    Dg Abd Acute W/chest  Result Date: 11/25/2015 CLINICAL DATA:  Right lower quadrant pain since yesterday EXAM: DG ABDOMEN ACUTE W/ 1V CHEST COMPARISON:  Chest x-ray Nov 11, 2015 FINDINGS: There is no evidence of dilated bowel loops or free intraperitoneal air. No radiopaque calculi or other significant radiographic abnormality is seen. Heart size and mediastinal contours are within normal limits. There is no focal infiltrate, pulmonary edema, or pleural effusion. Mild scar is identified in the left lung base. Prior cholecystectomy clips are noted. There is scoliosis of spine. IMPRESSION: Negative abdominal radiographs.  No acute cardiopulmonary disease. Electronically Signed   By: Abelardo Diesel M.D.   On: 11/25/2015 14:42    Assessment & Plan:   [name] was seen today for uri.  Diagnoses and all orders for this visit:  RTI (respiratory tract infection)- will treat the infection with Zithromax and will control the cough with Phenergan DM. -     Discontinue: promethazine-dextromethorphan (PROMETHAZINE-DM) 6.25-15 MG/5ML syrup; Take 5 mLs by mouth 4 (four) times daily as needed for cough. -     Discontinue: azithromycin (ZITHROMAX) 500 MG tablet; Take 1 tablet (500 mg total) by mouth daily. -     promethazine-dextromethorphan (PROMETHAZINE-DM) 6.25-15 MG/5ML syrup; Take 5 mLs by mouth 4 (four) times daily as needed for cough. -     azithromycin (ZITHROMAX) 500 MG tablet; Take 1 tablet (500 mg total) by mouth daily.  Vitamin B12 deficiency anemia due to intrinsic factor deficiency -     Cyanocobalamin 500 MCG/0.1ML SOLN; Place 0.1 mLs (500 mcg total) into the nose once a week.   I am having [name] start on Cyanocobalamin. I am also having her maintain her nitroGLYCERIN, Brimonidine Tartrate (ALPHAGAN P OP), ALPRAZolam, potassium  chloride SA, furosemide, promethazine-dextromethorphan, and azithromycin.  Meds ordered this encounter  Medications  . DISCONTD: promethazine-dextromethorphan (PROMETHAZINE-DM) 6.25-15 MG/5ML syrup    Sig: Take 5 mLs by mouth 4 (four) times daily as needed for cough.    Dispense:  118 mL    Refill:  0  . DISCONTD: azithromycin (ZITHROMAX) 500 MG tablet    Sig: Take 1 tablet (500 mg total) by mouth daily.    Dispense:  3 tablet    Refill:  0  . promethazine-dextromethorphan (PROMETHAZINE-DM) 6.25-15 MG/5ML syrup    Sig: Take 5 mLs by mouth 4 (four) times daily as needed for cough.    Dispense:  118 mL    Refill:  0  . azithromycin (ZITHROMAX) 500 MG tablet    Sig: Take 1 tablet (500 mg total) by mouth daily.    Dispense:  3 tablet    Refill:  0  . Cyanocobalamin 500 MCG/0.1ML SOLN  Sig: Place 0.1 mLs (500 mcg total) into the nose once a week.    Dispense:  2.3 mL    Refill:  11     Follow-up: Return in about 3 weeks (around 05/25/2016).  Scarlette Calico, MD

## 2016-05-04 NOTE — Patient Instructions (Signed)
Upper Respiratory Infection, Adult Most upper respiratory infections (URIs) are caused by a virus. A URI affects the nose, throat, and upper air passages. The most common type of URI is often called "the common cold." Follow these instructions at home:  Take medicines only as told by your doctor.  Gargle warm saltwater or take cough drops to comfort your throat as told by your doctor.  Use a warm mist humidifier or inhale steam from a shower to increase air moisture. This may make it easier to breathe.  Drink enough fluid to keep your pee (urine) clear or pale yellow.  Eat soups and other clear broths.  Have a healthy diet.  Rest as needed.  Go back to work when your fever is gone or your doctor says it is okay.  You may need to stay home longer to avoid giving your URI to others.  You can also wear a face mask and wash your hands often to prevent spread of the virus.  Use your inhaler more if you have asthma.  Do not use any tobacco products, including cigarettes, chewing tobacco, or electronic cigarettes. If you need help quitting, ask your doctor. Contact a doctor if:  You are getting worse, not better.  Your symptoms are not helped by medicine.  You have chills.  You are getting more short of breath.  You have brown or red mucus.  You have yellow or brown discharge from your nose.  You have pain in your face, especially when you bend forward.  You have a fever.  You have puffy (swollen) neck glands.  You have pain while swallowing.  You have white areas in the back of your throat. Get help right away if:  You have very bad or constant:  Headache.  Ear pain.  Pain in your forehead, behind your eyes, and over your cheekbones (sinus pain).  Chest pain.  You have long-lasting (chronic) lung disease and any of the following:  Wheezing.  Long-lasting cough.  Coughing up blood.  A change in your usual mucus.  You have a stiff neck.  You have  changes in your:  Vision.  Hearing.  Thinking.  Mood. This information is not intended to replace advice given to you by your health care provider. Make sure you discuss any questions you have with your health care provider. Document Released: 11/23/2007 Document Revised: 02/07/2016 Document Reviewed: 09/11/2013 Elsevier Interactive Patient Education  2017 [employer]  

## 2016-06-15 ENCOUNTER — Other Ambulatory Visit: Payer: Self-pay | Admitting: Internal Medicine

## 2016-06-15 NOTE — Telephone Encounter (Signed)
Rec'd call pt requesting status on refill inform MD out of office this week. Will have to forward to another MD for approval. Pls advise if ok,,,./lmb

## 2016-06-15 NOTE — Telephone Encounter (Signed)
[place] controlled substance database checked.  Ok to fill medication.  Fill with no refills.

## 2016-06-16 ENCOUNTER — Other Ambulatory Visit: Payer: Self-pay | Admitting: Internal Medicine

## 2016-06-16 NOTE — Telephone Encounter (Signed)
Per chart Dr. Ronnald Ramp approved yesterday 06/15/16 called his approval in...Johny Chess

## 2016-06-16 NOTE — Telephone Encounter (Signed)
Printed rx shredded.

## 2016-06-16 NOTE — Telephone Encounter (Signed)
Called refill into [facility] had to leave on pharmacy vm.../lmb  

## 2016-06-21 ENCOUNTER — Encounter: Payer: Self-pay | Admitting: Internal Medicine

## 2016-06-21 ENCOUNTER — Ambulatory Visit (INDEPENDENT_AMBULATORY_CARE_PROVIDER_SITE_OTHER): Payer: Medicare Other | Admitting: Internal Medicine

## 2016-06-21 ENCOUNTER — Ambulatory Visit (INDEPENDENT_AMBULATORY_CARE_PROVIDER_SITE_OTHER)
Admission: RE | Admit: 2016-06-21 | Discharge: 2016-06-21 | Disposition: A | Discharge status: Home / Self Care | Payer: Medicare Other | Source: Ambulatory Visit | Attending: Internal Medicine | Admitting: Internal Medicine

## 2016-06-21 VITALS — BP 148/80 | HR 84 | Temp 98.2°F | Resp 16 | Ht 63.0 in | Wt 175.5 lb

## 2016-06-21 DIAGNOSIS — R05 Cough: Secondary | ICD-10-CM

## 2016-06-21 DIAGNOSIS — R059 Cough, unspecified: Secondary | ICD-10-CM

## 2016-06-21 DIAGNOSIS — J988 Other specified respiratory disorders: Secondary | ICD-10-CM | POA: Diagnosis not present

## 2016-06-21 DIAGNOSIS — J410 Simple chronic bronchitis: Secondary | ICD-10-CM | POA: Diagnosis not present

## 2016-06-21 DIAGNOSIS — J301 Allergic rhinitis due to pollen: Secondary | ICD-10-CM | POA: Diagnosis not present

## 2016-06-21 MED ORDER — METHYLPREDNISOLONE ACETATE 80 MG/ML IJ SUSP
120.0000 mg | Freq: Once | INTRAMUSCULAR | Status: AC
  Administered 2016-06-21: 120 mg via INTRAMUSCULAR

## 2016-06-21 MED ORDER — TIOTROPIUM BROMIDE MONOHYDRATE 1.25 MCG/ACT IN AERS: 2.0000 | INHALATION_SPRAY | Freq: Every day | RESPIRATORY_TRACT | 11 refills | Status: DC

## 2016-06-21 MED ORDER — PROMETHAZINE-DM 6.25-15 MG/5ML PO SYRP: 5.0000 mL | ORAL_SOLUTION | Freq: Four times a day (QID) | ORAL | 0 refills | Status: DC | PRN

## 2016-06-21 MED ORDER — CEFDINIR 300 MG PO CAPS: 300.0000 mg | ORAL_CAPSULE | Freq: Two times a day (BID) | ORAL | 1 refills | Status: AC

## 2016-06-21 NOTE — Progress Notes (Signed)
Subjective:  Patient ID: [name], female    DOB: 04-27-1941  Age: 76 y.o. MRN: [id number]  CC: URI; Cough; and Allergic Rhinitis    HPI [name] presents for a 5 day hx of Nonproductive cough with low-grade fever, chills, shortness of breath, and a couple episodes of wheezing. She has also had a sore throat and earaches. She actually tells me her symptoms have been persistent and intermittent over the last 4-6 weeks. She had Tylenol and a leftover prescription for cough medicine at home that she was taking to control her symptoms.  Outpatient Medications Prior to Visit  Medication Sig Dispense Refill  . ALPRAZolam (XANAX) 0.5 MG tablet TAKE ONE TABLET BY MOUTH THREE TIMES DAILY AS NEEDED 90 tablet 2  . Brimonidine Tartrate (ALPHAGAN P OP) Apply 1 drop to eye 2 (two) times daily.    . Cyanocobalamin 500 MCG/0.1ML SOLN Place 0.1 mLs (500 mcg total) into the nose once a week. 2.3 mL 11  . furosemide (LASIX) 20 MG tablet TAKE ONE TABLET BY MOUTH ONCE DAILY 30 tablet 1  . potassium chloride SA (K-DUR,KLOR-CON) 20 MEQ tablet Take 1 tablet (20 mEq total) by mouth 2 (two) times daily. 180 tablet 1  . nitroGLYCERIN (NITROSTAT) 0.4 MG SL tablet Place 1 tablet (0.4 mg total) under the tongue every 5 (five) minutes as needed for chest pain. (Patient not taking: Reported on 05/04/2016) 60 tablet 5  . promethazine-dextromethorphan (PROMETHAZINE-DM) 6.25-15 MG/5ML syrup Take 5 mLs by mouth 4 (four) times daily as needed for cough. 118 mL 0   No facility-administered medications prior to visit.     ROS Review of Systems  Constitutional: Positive for chills and fever. Negative for appetite change, diaphoresis and fatigue.  HENT: Positive for congestion, ear pain, postnasal drip, rhinorrhea and sore throat. Negative for ear discharge, facial swelling, hearing loss, sinus pain, sinus pressure, trouble swallowing and voice change.   Eyes: Negative.   Respiratory: Positive for cough, shortness  of breath and wheezing. Negative for chest tightness and stridor.   Cardiovascular: Negative.  Negative for chest pain, palpitations and leg swelling.  Gastrointestinal: Negative.  Negative for abdominal pain, constipation, diarrhea, nausea and vomiting.  Endocrine: Negative.   Genitourinary: Negative.  Negative for difficulty urinating and dysuria.  Musculoskeletal: Negative for back pain and myalgias.  Skin: Negative.  Negative for rash.  Allergic/Immunologic: Negative.   Neurological: Negative.  Negative for dizziness and weakness.  Hematological: Negative.  Negative for adenopathy. Does not bruise/bleed easily.  Psychiatric/Behavioral: Negative.     Objective:  BP (!) 148/80 (BP Location: Left Arm, Patient Position: Sitting, Cuff Size: Large)   Pulse 84   Temp 98.2 F (36.8 C) (Oral)   Resp 16   Ht 5\' 3"  (1.6 m)   Wt 175 lb 8 oz (79.6 kg)   SpO2 96%   BMI 31.09 kg/m   BP Readings from Last 3 Encounters:  06/21/16 (!) 148/80  05/04/16 130/88  03/04/16 140/82    Wt Readings from Last 3 Encounters:  06/21/16 175 lb 8 oz (79.6 kg)  05/04/16 178 lb 4 oz (80.9 kg)  03/04/16 177 lb (80.3 kg)    Physical Exam  Constitutional: She is oriented to person, place, and time.  Non-toxic appearance. She does not have a sickly appearance. She does not appear ill. No distress.  HENT:  Right Ear: Hearing, tympanic membrane, external ear and ear canal normal.  Left Ear: Hearing, tympanic membrane, external ear and ear canal  normal.  Nose: No rhinorrhea. Right sinus exhibits no maxillary sinus tenderness and no frontal sinus tenderness. Left sinus exhibits no maxillary sinus tenderness and no frontal sinus tenderness.  Mouth/Throat: Mucous membranes are normal. Mucous membranes are not pale, not dry and not cyanotic. Posterior oropharyngeal erythema present. No oropharyngeal exudate or posterior oropharyngeal edema.  Eyes: Conjunctivae are normal. Right eye exhibits no discharge. Left eye  exhibits no discharge. No scleral icterus.  Neck: Normal range of motion. Neck supple. No JVD present. No tracheal deviation present. No thyromegaly present.  Cardiovascular: Normal rate, regular rhythm, normal heart sounds and intact distal pulses.  Exam reveals no gallop and no friction rub.   No murmur heard. Pulmonary/Chest: Effort normal. No stridor. No respiratory distress. She has no wheezes. She has no rales. She exhibits no tenderness.  Abdominal: Soft. Bowel sounds are normal. She exhibits no distension and no mass. There is no tenderness. There is no rebound and no guarding.  Musculoskeletal: Normal range of motion. She exhibits no edema, tenderness or deformity.  Lymphadenopathy:    She has no cervical adenopathy.  Neurological: She is oriented to person, place, and time.  Skin: Skin is warm and dry. No rash noted. She is not diaphoretic. No erythema. No pallor.  Vitals reviewed.   Lab Results  Component Value Date   WBC 6.6 11/25/2015   HGB 14.6 11/25/2015   HCT 43.7 11/25/2015   PLT 164.0 11/25/2015   GLUCOSE 111 (H) 11/25/2015   CHOL 287 (H) 11/25/2015   TRIG 101.0 11/25/2015   HDL 55.90 11/25/2015   LDLDIRECT 159.0 12/02/2014   LDLCALC 211 (H) 11/25/2015   ALT 22 11/25/2015   AST 21 11/25/2015   NA 141 11/25/2015   K 4.5 11/25/2015   CL 105 11/25/2015   CREATININE 0.77 11/25/2015   BUN 19 11/25/2015   CO2 30 11/25/2015   TSH 3.84 11/25/2015   INR 1.0 RATIO 04/28/2008   HGBA1C 5.1 07/06/2012    Dg Abd Acute W/chest  Result Date: 11/25/2015 CLINICAL DATA:  Right lower quadrant pain since yesterday EXAM: DG ABDOMEN ACUTE W/ 1V CHEST COMPARISON:  Chest x-ray Nov 11, 2015 FINDINGS: There is no evidence of dilated bowel loops or free intraperitoneal air. No radiopaque calculi or other significant radiographic abnormality is seen. Heart size and mediastinal contours are within normal limits. There is no focal infiltrate, pulmonary edema, or pleural effusion. Mild scar  is identified in the left lung base. Prior cholecystectomy clips are noted. There is scoliosis of spine. IMPRESSION: Negative abdominal radiographs.  No acute cardiopulmonary disease. Electronically Signed   By: Abelardo Diesel M.D.   On: 11/25/2015 14:42    Assessment & Plan:   [name] was seen today for uri, cough and allergic rhinitis .  Diagnoses and all orders for this visit:  Cough- her chest x-ray is remarkable only for chronic bronchitis. Will treat as such. -     Discontinue: promethazine-dextromethorphan (PROMETHAZINE-DM) 6.25-15 MG/5ML syrup; Take 5 mLs by mouth 4 (four) times daily as needed for cough. -     DG Chest 2 View; Future -     promethazine-dextromethorphan (PROMETHAZINE-DM) 6.25-15 MG/5ML syrup; Take 5 mLs by mouth 4 (four) times daily as needed for cough.  Allergic rhinitis due to pollen, unspecified chronicity, unspecified seasonality- she is having an exacerbation of symptoms so I gave her an injection of Depo-Medrol. -     Discontinue: promethazine-dextromethorphan (PROMETHAZINE-DM) 6.25-15 MG/5ML syrup; Take 5 mLs by mouth 4 (four) times daily as  needed for cough. -     promethazine-dextromethorphan (PROMETHAZINE-DM) 6.25-15 MG/5ML syrup; Take 5 mLs by mouth 4 (four) times daily as needed for cough.  RTI (respiratory tract infection) -     Discontinue: promethazine-dextromethorphan (PROMETHAZINE-DM) 6.25-15 MG/5ML syrup; Take 5 mLs by mouth 4 (four) times daily as needed for cough. -     cefdinir (OMNICEF) 300 MG capsule; Take 1 capsule (300 mg total) by mouth 2 (two) times daily. -     promethazine-dextromethorphan (PROMETHAZINE-DM) 6.25-15 MG/5ML syrup; Take 5 mLs by mouth 4 (four) times daily as needed for cough.  Simple chronic bronchitis ([place])- will start a LAMA for this -     Tiotropium Bromide Monohydrate (SPIRIVA RESPIMAT) 1.25 MCG/ACT AERS; Inhale 2 puffs into the lungs daily.   I have discontinued Ms. Woodle's nitroGLYCERIN. I am also having her start on  cefdinir and Tiotropium Bromide Monohydrate. Additionally, I am having her maintain her Brimonidine Tartrate (ALPHAGAN P OP), potassium chloride SA, furosemide, Cyanocobalamin, ALPRAZolam, and promethazine-dextromethorphan. We administered methylPREDNISolone acetate.  Meds ordered this encounter  Medications  . DISCONTD: promethazine-dextromethorphan (PROMETHAZINE-DM) 6.25-15 MG/5ML syrup    Sig: Take 5 mLs by mouth 4 (four) times daily as needed for cough.    Dispense:  118 mL    Refill:  0  . cefdinir (OMNICEF) 300 MG capsule    Sig: Take 1 capsule (300 mg total) by mouth 2 (two) times daily.    Dispense:  20 capsule    Refill:  1  . promethazine-dextromethorphan (PROMETHAZINE-DM) 6.25-15 MG/5ML syrup    Sig: Take 5 mLs by mouth 4 (four) times daily as needed for cough.    Dispense:  118 mL    Refill:  0  . Tiotropium Bromide Monohydrate (SPIRIVA RESPIMAT) 1.25 MCG/ACT AERS    Sig: Inhale 2 puffs into the lungs daily.    Dispense:  4 g    Refill:  11  . methylPREDNISolone acetate (DEPO-MEDROL) injection 120 mg     Follow-up: Return if symptoms worsen or fail to improve.  Scarlette Calico, MD

## 2016-06-21 NOTE — Patient Instructions (Signed)
Cough, Adult Coughing is a reflex that clears your throat and your airways. Coughing helps to heal and protect your lungs. It is normal to cough occasionally, but a cough that happens with other symptoms or lasts a long time may be a sign of a condition that needs treatment. A cough may last only 2-3 weeks (acute), or it may last longer than 8 weeks (chronic). What are the causes? Coughing is commonly caused by:  Breathing in substances that irritate your lungs.  A viral or bacterial respiratory infection.  Allergies.  Asthma.  Postnasal drip.  Smoking.  Acid backing up from the stomach into the esophagus (gastroesophageal reflux).  Certain medicines.  Chronic lung problems, including COPD (or rarely, lung cancer).  Other medical conditions such as heart failure.  Follow these instructions at home: Pay attention to any changes in your symptoms. Take these actions to help with your discomfort:  Take medicines only as told by your health care provider. ? If you were prescribed an antibiotic medicine, take it as told by your health care provider. Do not stop taking the antibiotic even if you start to feel better. ? Talk with your health care provider before you take a cough suppressant medicine.  Drink enough fluid to keep your urine clear or pale yellow.  If the air is dry, use a cold steam vaporizer or humidifier in your bedroom or your home to help loosen secretions.  Avoid anything that causes you to cough at work or at home.  If your cough is worse at night, try sleeping in a semi-upright position.  Avoid cigarette smoke. If you smoke, quit smoking. If you need help quitting, ask your health care provider.  Avoid caffeine.  Avoid alcohol.  Rest as needed.  Contact a health care provider if:  You have new symptoms.  You cough up pus.  Your cough does not get better after 2-3 weeks, or your cough gets worse.  You cannot control your cough with suppressant  medicines and you are losing sleep.  You develop pain that is getting worse or pain that is not controlled with pain medicines.  You have a fever.  You have unexplained weight loss.  You have night sweats. Get help right away if:  You cough up blood.  You have difficulty breathing.  Your heartbeat is very fast. This information is not intended to replace advice given to you by your health care provider. Make sure you discuss any questions you have with your health care provider. Document Released: 12/03/2010 Document Revised: 11/12/2015 Document Reviewed: 08/13/2014 Elsevier Interactive Patient Education  2017 [employer]  

## 2016-06-21 NOTE — Telephone Encounter (Signed)
rx faxed to [facility]

## 2016-06-21 NOTE — Progress Notes (Signed)
Pre visit review using our clinic review tool, if applicable. No additional management support is needed unless otherwise documented below in the visit note. 

## 2016-06-22 ENCOUNTER — Other Ambulatory Visit: Payer: Self-pay | Admitting: Internal Medicine

## 2016-06-22 DIAGNOSIS — R05 Cough: Secondary | ICD-10-CM

## 2016-06-22 DIAGNOSIS — R059 Cough, unspecified: Secondary | ICD-10-CM

## 2016-06-22 DIAGNOSIS — J988 Other specified respiratory disorders: Secondary | ICD-10-CM

## 2016-06-22 DIAGNOSIS — J301 Allergic rhinitis due to pollen: Secondary | ICD-10-CM

## 2016-07-04 ENCOUNTER — Other Ambulatory Visit: Payer: Self-pay | Admitting: Internal Medicine

## 2016-10-06 ENCOUNTER — Telehealth: Payer: Self-pay | Admitting: Internal Medicine

## 2016-10-06 DIAGNOSIS — I10 Essential (primary) hypertension: Secondary | ICD-10-CM

## 2016-10-06 MED ORDER — POTASSIUM CHLORIDE CRYS ER 20 MEQ PO TBCR: 20.0000 meq | EXTENDED_RELEASE_TABLET | Freq: Two times a day (BID) | ORAL | 0 refills | Status: DC

## 2016-10-06 NOTE — Telephone Encounter (Signed)
Pt needs refill on potassium chloride SA (K-DUR,KLOR-CON) 20 MEQ tablet to be sent in to [facility].

## 2016-10-06 NOTE — Telephone Encounter (Signed)
Sent refill to [facility]...Johny Chess

## 2016-10-11 ENCOUNTER — Encounter: Payer: Self-pay | Admitting: Internal Medicine

## 2016-10-11 ENCOUNTER — Ambulatory Visit (INDEPENDENT_AMBULATORY_CARE_PROVIDER_SITE_OTHER)
Admission: RE | Admit: 2016-10-11 | Discharge: 2016-10-11 | Disposition: A | Discharge status: Home / Self Care | Payer: Medicare Other | Source: Ambulatory Visit | Attending: Internal Medicine | Admitting: Internal Medicine

## 2016-10-11 ENCOUNTER — Other Ambulatory Visit: Payer: Medicare Other

## 2016-10-11 ENCOUNTER — Other Ambulatory Visit (INDEPENDENT_AMBULATORY_CARE_PROVIDER_SITE_OTHER): Payer: Medicare Other

## 2016-10-11 ENCOUNTER — Ambulatory Visit (INDEPENDENT_AMBULATORY_CARE_PROVIDER_SITE_OTHER): Payer: Medicare Other | Admitting: Internal Medicine

## 2016-10-11 VITALS — BP 112/60 | HR 57 | Temp 97.7°F | Ht 63.0 in | Wt 180.0 lb

## 2016-10-11 DIAGNOSIS — M7661 Achilles tendinitis, right leg: Secondary | ICD-10-CM | POA: Insufficient documentation

## 2016-10-11 DIAGNOSIS — F411 Generalized anxiety disorder: Secondary | ICD-10-CM | POA: Diagnosis not present

## 2016-10-11 DIAGNOSIS — M79671 Pain in right foot: Secondary | ICD-10-CM

## 2016-10-11 DIAGNOSIS — D518 Other vitamin B12 deficiency anemias: Secondary | ICD-10-CM

## 2016-10-11 DIAGNOSIS — I10 Essential (primary) hypertension: Secondary | ICD-10-CM

## 2016-10-11 LAB — CBC WITH DIFFERENTIAL/PLATELET
BASOS PCT: 0.8 % (ref 0.0–3.0)
Basophils Absolute: 0 10*3/uL (ref 0.0–0.1)
EOS PCT: 1.8 % (ref 0.0–5.0)
Eosinophils Absolute: 0.1 10*3/uL (ref 0.0–0.7)
HCT: 41.9 % (ref 36.0–46.0)
Hemoglobin: 14.1 g/dL (ref 12.0–15.0)
LYMPHS ABS: 1 10*3/uL (ref 0.7–4.0)
Lymphocytes Relative: 19.4 % (ref 12.0–46.0)
MCHC: 33.5 g/dL (ref 30.0–36.0)
MCV: 85.9 fl (ref 78.0–100.0)
MONO ABS: 0.3 10*3/uL (ref 0.1–1.0)
MONOS PCT: 5.1 % (ref 3.0–12.0)
NEUTROS ABS: 3.7 10*3/uL (ref 1.4–7.7)
NEUTROS PCT: 72.9 % (ref 43.0–77.0)
PLATELETS: 142 10*3/uL — AB (ref 150.0–400.0)
RBC: 4.88 Mil/uL (ref 3.87–5.11)
RDW: 14 % (ref 11.5–15.5)
WBC: 5 10*3/uL (ref 4.0–10.5)

## 2016-10-11 LAB — SEDIMENTATION RATE: SED RATE: 7 mm/h (ref 0–30)

## 2016-10-11 MED ORDER — METHYLPREDNISOLONE ACETATE 80 MG/ML IJ SUSP
120.0000 mg | Freq: Once | INTRAMUSCULAR | Status: AC
  Administered 2016-10-11: 120 mg via INTRAMUSCULAR

## 2016-10-11 MED ORDER — DICLOFENAC SODIUM 1 % TD GEL: 2.0000 g | Freq: Four times a day (QID) | TRANSDERMAL | 1 refills | Status: DC

## 2016-10-11 NOTE — Progress Notes (Signed)
Subjective:  Patient ID: [name], female    DOB: 18-Mar-1941  Age: 76 y.o. MRN: [id number]  CC: Foot Pain   HPI [name] presents for a 1 week hx of pain and swelling over the upper back side of her right heel. She denies an injury or trauma. She is taking advil and tylenol for sx relief.  Outpatient Medications Prior to Visit  Medication Sig Dispense Refill  . Brimonidine Tartrate (ALPHAGAN P OP) Apply 1 drop to eye 2 (two) times daily.    . Cyanocobalamin 500 MCG/0.1ML SOLN Place 0.1 mLs (500 mcg total) into the nose once a week. 2.3 mL 11  . furosemide (LASIX) 20 MG tablet TAKE ONE TABLET BY MOUTH ONCE DAILY 30 tablet 3  . Tiotropium Bromide Monohydrate (SPIRIVA RESPIMAT) 1.25 MCG/ACT AERS Inhale 2 puffs into the lungs daily. 4 g 11  . ALPRAZolam (XANAX) 0.5 MG tablet TAKE ONE TABLET BY MOUTH THREE TIMES DAILY AS NEEDED 90 tablet 2  . potassium chloride SA (K-DUR,KLOR-CON) 20 MEQ tablet Take 1 tablet (20 mEq total) by mouth 2 (two) times daily. Yearly physical w/labs due in August 180 tablet 0  . promethazine-dextromethorphan (PROMETHAZINE-DM) 6.25-15 MG/5ML syrup TAKE FIVE ML (CC) BY MOUTH 4 TIMES DAILY AS NEEDED FOR COUGH 118 mL 1   No facility-administered medications prior to visit.     ROS Review of Systems  Constitutional: Negative for chills, fatigue and fever.  HENT: Negative.  Negative for trouble swallowing.   Eyes: Negative.   Respiratory: Negative.   Endocrine: Negative.   Genitourinary: Negative for difficulty urinating.  Musculoskeletal: Positive for arthralgias (right heel only). Negative for back pain.  Allergic/Immunologic: Negative.   Neurological: Negative.  Negative for dizziness and weakness.  Hematological: Negative.  Negative for adenopathy. Does not bruise/bleed easily.  Psychiatric/Behavioral: Negative for behavioral problems, decreased concentration, dysphoric mood and sleep disturbance. The patient is nervous/anxious.   All other systems  reviewed and are negative.   Objective:  BP 112/60 (BP Location: Left Arm, Patient Position: Sitting, Cuff Size: Large)   Pulse (!) 57   Temp 97.7 F (36.5 C) (Oral)   Ht 5\' 3"  (1.6 m)   Wt 180 lb (81.6 kg)   SpO2 99%   BMI 31.89 kg/m   BP Readings from Last 3 Encounters:  10/11/16 112/60  06/21/16 (!) 148/80  05/04/16 130/88    Wt Readings from Last 3 Encounters:  10/11/16 180 lb (81.6 kg)  06/21/16 175 lb 8 oz (79.6 kg)  05/04/16 178 lb 4 oz (80.9 kg)    Physical Exam  Constitutional: She is oriented to person, place, and time. No distress.  HENT:  Mouth/Throat: Oropharynx is clear and moist. No oropharyngeal exudate.  Eyes: Conjunctivae are normal. Right eye exhibits no discharge. Left eye exhibits no discharge. No scleral icterus.  Neck: Normal range of motion. Neck supple. No JVD present. No tracheal deviation present. No thyromegaly present.  Cardiovascular: Normal rate, regular rhythm, normal heart sounds and intact distal pulses.  Exam reveals no gallop and no friction rub.   No murmur heard. Pulmonary/Chest: Effort normal and breath sounds normal. No stridor. No respiratory distress. She has no wheezes. She has no rales. She exhibits no tenderness.  Abdominal: Soft. Bowel sounds are normal. She exhibits no distension and no mass. There is no tenderness. There is no rebound and no guarding.  Musculoskeletal:       Right foot: There is bony tenderness, swelling and deformity. There is normal  range of motion, no tenderness and no crepitus.       Feet:  Lymphadenopathy:    She has no cervical adenopathy.  Neurological: She is alert and oriented to person, place, and time.  Skin: Skin is warm and dry. She is not diaphoretic.  Psychiatric: She has a normal mood and affect. Her behavior is normal. Judgment and thought content normal.  Vitals reviewed.   Lab Results  Component Value Date   WBC 5.0 10/11/2016   HGB 14.1 10/11/2016   HCT 41.9 10/11/2016   PLT 142.0  (L) 10/11/2016   GLUCOSE 87 10/11/2016   CHOL 287 (H) 11/25/2015   TRIG 101.0 11/25/2015   HDL 55.90 11/25/2015   LDLDIRECT 159.0 12/02/2014   LDLCALC 211 (H) 11/25/2015   ALT 22 11/25/2015   AST 21 11/25/2015   NA 142 10/11/2016   K 3.2 (L) 10/11/2016   CL 105 10/11/2016   CREATININE 0.83 10/11/2016   BUN 15 10/11/2016   CO2 27 10/11/2016   TSH 3.84 11/25/2015   INR 1.0 RATIO 04/28/2008   HGBA1C 5.1 07/06/2012    Dg Chest 2 View  Result Date: 06/21/2016 CLINICAL DATA:  Cough, chest congestion, shortness of breath for past 3-4 days. History of coronary artery disease and allergic rhinitis. Nonsmoker. EXAM: CHEST  2 VIEW COMPARISON:  Chest x-ray of November 25, 2015 FINDINGS: The lungs are adequately inflated. The interstitial markings are coarse though stable. There is no alveolar infiltrate. There is no pleural effusion. The heart and pulmonary vascularity are normal. The mediastinum is normal in width. The trachea is midline. The bony thorax exhibits no acute abnormality. IMPRESSION: Chronic bronchitic changes, stable. No pneumonia, CHF, nor other acute cardiopulmonary abnormality. Electronically Signed   By: David  Martinique M.D.   On: 06/21/2016 10:10    Assessment & Plan:   Preciosa was seen today for foot pain.  Diagnoses and all orders for this visit:  Other vitamin B12 deficiency anemia- improvement noted -     Cancel: CBC with Differential/Platelet; Future -     CBC with Differential/Platelet; Future  Essential hypertension- her BP is well controlled -     Cancel: Basic metabolic panel; Future -     Basic metabolic panel; Future  Achilles bursitis of right lower extremity- Will treat with topical Voltaren and and an injection of methylprednisolone, I've also asked her to see orthopedics to see if the bursa would benefit from being injected with steroids. -     DG Os [place]cis Right; Future -     diclofenac sodium (VOLTAREN) 1 % GEL; Apply 2 g topically 4 (four) times daily. -      Sedimentation rate; Future -     Uric acid; Future -     methylPREDNISolone acetate (DEPO-MEDROL) injection 120 mg; Inject 1.5 mLs (120 mg total) into the muscle once. -     Ambulatory referral to Orthopedic Surgery  Pain of right heel- labs are negative for any concerns of infection or gouty arthropathy, the plain film is positive for bone spur, best her to see orthopedics to see if this can be surgically treated -     DG Os [place]cis Right; Future -     diclofenac sodium (VOLTAREN) 1 % GEL; Apply 2 g topically 4 (four) times daily. -     Sedimentation rate; Future -     Uric acid; Future  GAD (generalized anxiety disorder) -     ALPRAZolam (XANAX) 0.5 MG tablet; Take 1 tablet (0.5  mg total) by mouth 3 (three) times daily as needed.   I have discontinued Ms. Donoso's promethazine-dextromethorphan. I have also changed her ALPRAZolam. Additionally, I am having her start on diclofenac sodium. Lastly, I am having her maintain her Brimonidine Tartrate (ALPHAGAN P OP), Cyanocobalamin, Tiotropium Bromide Monohydrate, and furosemide. We administered methylPREDNISolone acetate.  Meds ordered this encounter  Medications  . diclofenac sodium (VOLTAREN) 1 % GEL    Sig: Apply 2 g topically 4 (four) times daily.    Dispense:  100 g    Refill:  1  . methylPREDNISolone acetate (DEPO-MEDROL) injection 120 mg  . ALPRAZolam (XANAX) 0.5 MG tablet    Sig: Take 1 tablet (0.5 mg total) by mouth 3 (three) times daily as needed.    Dispense:  90 tablet    Refill:  2     Follow-up: Return in about 3 weeks (around 11/01/2016).  Scarlette Calico, MD

## 2016-10-11 NOTE — Patient Instructions (Signed)
Achilles Tendinitis  Achilles tendinitis is inflammation of the tough, cord-like band that attaches the lower leg muscles to the heel bone (Achilles tendon). This is usually caused by overusing the tendon and the ankle joint.  Achilles tendinitis usually gets better over time with treatment and caring for yourself at home. It can take weeks or months to heal completely.  What are the causes?  This condition may be caused by:  · A sudden increase in exercise or activity, such as running.  · Doing the same exercises or activities (such as jumping) over and over.  · Not warming up calf muscles before exercising.  · Exercising in shoes that are worn out or not made for exercise.  · Having arthritis or a bone growth (spur) on the back of the heel bone. This can rub against the tendon and hurt it.  · Age-related wear and tear. Tendons become less flexible with age and more likely to be injured.    What are the signs or symptoms?  Common symptoms of this condition include:  · Pain in the Achilles tendon or in the back of the leg, just above the heel. The pain usually gets worse with exercise.  · Stiffness or soreness in the back of the leg, especially in the morning.  · Swelling of the skin over the Achilles tendon.  · Thickening of the tendon.  · Bone spurs at the bottom of the Achilles tendon, near the heel.  · Trouble standing on tiptoe.    How is this diagnosed?  This condition is diagnosed based on your symptoms and a physical exam. You may have tests, including:  · X-rays.  · MRI.    How is this treated?  The goal of treatment is to relieve symptoms and help your injury heal. Treatment may include:  · Decreasing or stopping activities that caused the tendinitis. This may mean switching to low-impact exercises like biking or swimming.  · Icing the injured area.  · Doing physical therapy, including strengthening and stretching exercises.  · NSAIDs to help relieve pain and swelling.  · Using supportive shoes, wraps,  heel lifts, or a walking boot (air cast).  · Surgery. This may be done if your symptoms do not improve after 6 months.  · Using high-energy shock wave impulses to stimulate the healing process (extracorporeal shock wave therapy). This is rare.  · Injection of medicines to help relieve inflammation (corticosteroids). This is rare.    Follow these instructions at home:  If you have an air cast:   · Wear the cast as told by your health care provider. Remove it only as told by your health care provider.  · Loosen the cast if your toes tingle, become numb, or turn cold and blue.  Activity   · Gradually return to your normal activities once your health care provider approves. Do not do activities that cause pain.  ? Consider doing low-impact exercises, like cycling or swimming.  · If you have an air cast, ask your health care provider when it is safe for you to drive.  · If physical therapy was prescribed, do exercises as told by your health care provider or physical therapist.  Managing pain, stiffness, and swelling   · Raise (elevate) your foot above the level of your heart while you are sitting or lying down.  · Move your toes often to avoid stiffness and to lessen swelling.  · If directed, put ice on the injured area:  ?   Put ice in a plastic bag.  ? Place a towel between your skin and the bag.  ? Leave the ice on for 20 minutes, 2-3 times a day  General instructions   · If directed, wrap your foot with an elastic bandage or other wrap. This can help keep your tendon from moving too much while it heals. Your health care provider will show you how to wrap your foot correctly.  · Wear supportive shoes or heel lifts only as told by your health care provider.  · Take over-the-counter and prescription medicines only as told by your health care provider.  · Keep all follow-up visits as told by your health care provider. This is important.  Contact a health care provider if:  · You have symptoms that gets worse.  · You have  pain that does not get better with medicine.  · You develop new, unexplained symptoms.  · You develop warmth and swelling in your foot.  · You have a fever.  Get help right away if:  · You have a sudden popping sound or sensation in your Achilles tendon followed by severe pain.  · You cannot move your toes or foot.  · You cannot put any weight on your foot.  Summary  · Achilles tendinitis is inflammation of the tough, cord-like band that attaches the lower leg muscles to the heel bone (Achilles tendon).  · This condition is usually caused by overusing the tendon and the ankle joint. It can also be caused by arthritis or normal aging.  · The most common symptoms of this condition include pain, swelling, or stiffness in the Achilles tendon or in the back of the leg.  · This condition is usually treated with rest, NSAIDs, and physical therapy.  This information is not intended to replace advice given to you by your health care provider. Make sure you discuss any questions you have with your health care provider.  Document Released: 03/16/2005 Document Revised: 04/25/2016 Document Reviewed: 04/25/2016  Elsevier Interactive Patient Education © 2017 [employer]

## 2016-10-12 ENCOUNTER — Encounter: Payer: Self-pay | Admitting: Internal Medicine

## 2016-10-12 ENCOUNTER — Other Ambulatory Visit: Payer: Self-pay | Admitting: Internal Medicine

## 2016-10-12 DIAGNOSIS — I10 Essential (primary) hypertension: Secondary | ICD-10-CM

## 2016-10-12 LAB — BASIC METABOLIC PANEL
BUN: 15 mg/dL (ref 6–23)
CHLORIDE: 105 meq/L (ref 96–112)
CO2: 27 meq/L (ref 19–32)
Calcium: 9.2 mg/dL (ref 8.4–10.5)
Creatinine, Ser: 0.83 mg/dL (ref 0.40–1.20)
GFR: 71.1 mL/min (ref >60.00)
GLUCOSE: 87 mg/dL (ref 70–99)
POTASSIUM: 3.2 meq/L — AB (ref 3.5–5.1)
SODIUM: 142 meq/L (ref 135–145)

## 2016-10-12 LAB — URIC ACID: URIC ACID, SERUM: 4.3 mg/dL (ref 2.4–7.0)

## 2016-10-13 MED ORDER — ALPRAZOLAM 0.5 MG PO TABS: 0.5000 mg | ORAL_TABLET | Freq: Three times a day (TID) | ORAL | 2 refills | Status: DC | PRN

## 2016-10-13 MED ORDER — POTASSIUM CHLORIDE CRYS ER 20 MEQ PO TBCR: 20.0000 meq | EXTENDED_RELEASE_TABLET | Freq: Two times a day (BID) | ORAL | 1 refills | Status: DC

## 2016-10-24 ENCOUNTER — Encounter: Payer: Self-pay | Admitting: Internal Medicine

## 2016-10-24 ENCOUNTER — Ambulatory Visit (INDEPENDENT_AMBULATORY_CARE_PROVIDER_SITE_OTHER): Payer: Medicare Other | Admitting: Internal Medicine

## 2016-10-24 VITALS — BP 124/80 | HR 62 | Temp 98.2°F | Resp 16 | Wt 175.0 lb

## 2016-10-24 DIAGNOSIS — L853 Xerosis cutis: Secondary | ICD-10-CM

## 2016-10-24 DIAGNOSIS — J029 Acute pharyngitis, unspecified: Secondary | ICD-10-CM | POA: Diagnosis not present

## 2016-10-24 MED ORDER — AMOXICILLIN-POT CLAVULANATE 875-125 MG PO TABS: 1.0000 | ORAL_TABLET | Freq: Two times a day (BID) | ORAL | 0 refills | Status: DC

## 2016-10-24 MED ORDER — TRIAMCINOLONE ACETONIDE 0.5 % EX OINT: 1.0000 "application " | TOPICAL_OINTMENT | Freq: Two times a day (BID) | CUTANEOUS | 0 refills | Status: DC

## 2016-10-24 NOTE — Assessment & Plan Note (Signed)
Concerning for strep Will start augmentin x 10 days Continue advil/tylenol for pain Rest, fluids Call if no improvement

## 2016-10-24 NOTE — Patient Instructions (Addendum)
Take the antibiotic as directed. Take ibuprofen and tylenol for the pain.    Use the steroid cream on your thumb - do not use for more than 2 weeks.

## 2016-10-24 NOTE — Progress Notes (Signed)
Subjective:    Patient ID: [name], female    DOB: 1941-02-02, 76 y.o.   MRN: [id number]  HPI She is here for an acute visit for cold symptoms.   Her symptoms started yesterday.  She is experiencing sore throat, difficulty swallowing, runny nose, sweats last night, ear pain, minimal cough and headaches.  She denies congestion, sinus pain and SOB.    Her grandson has strep throat.   She has tried taking advil with some relief of the sore throat.  She has been able to swallow her pills.  She has a break in her skin in her right thumb.  She has been applying vaseline at night and it helps but has not cured it.    Medications and allergies reviewed with patient and updated if appropriate.  Patient Active Problem List   Diagnosis Date Noted  . Achilles bursitis of right lower extremity 10/11/2016  . Pain of right heel 10/11/2016  . Allergic rhinitis due to pollen 06/21/2016  . Simple chronic bronchitis ([place]) 06/21/2016  . Routine general medical examination at a health care facility 11/29/2015  . GAD (generalized anxiety disorder) 12/02/2014  . IBS (irritable bowel syndrome) 01/20/2014  . B12 deficiency anemia 07/10/2013  . Unspecified vitamin D deficiency 01/04/2013  . HTN (hypertension) 07/26/2011  . CAD (coronary artery disease) 07/26/2011  . FATTY LIVER DISEASE 06/20/2009  . OBESITY 09/08/2007  . OSTEOPENIA 09/07/2007  . Hyperlipidemia with target LDL less than 100 07/30/2007    Current Outpatient Prescriptions on File Prior to Visit  Medication Sig Dispense Refill  . ALPRAZolam (XANAX) 0.5 MG tablet Take 1 tablet (0.5 mg total) by mouth 3 (three) times daily as needed. 90 tablet 2  . Brimonidine Tartrate (ALPHAGAN P OP) Apply 1 drop to eye 2 (two) times daily.    . Cyanocobalamin 500 MCG/0.1ML SOLN Place 0.1 mLs (500 mcg total) into the nose once a week. 2.3 mL 11  . diclofenac sodium (VOLTAREN) 1 % GEL Apply 2 g topically 4 (four) times daily. 100 g 1  .  furosemide (LASIX) 20 MG tablet TAKE ONE TABLET BY MOUTH ONCE DAILY 30 tablet 3  . potassium chloride SA (K-DUR,KLOR-CON) 20 MEQ tablet Take 1 tablet (20 mEq total) by mouth 2 (two) times daily. 180 tablet 1  . Tiotropium Bromide Monohydrate (SPIRIVA RESPIMAT) 1.25 MCG/ACT AERS Inhale 2 puffs into the lungs daily. 4 g 11   No current facility-administered medications on file prior to visit.     Past Medical History:  Diagnosis Date  . Anxiety state, unspecified   . CAD (coronary artery disease)   . Disorder of bone and cartilage, unspecified   . Diverticulosis of colon (without mention of hemorrhage)   . Dizziness and giddiness   . Fatty liver   . Fibromyalgia   . Obesity, unspecified   . Other and unspecified hyperlipidemia   . Other chronic nonalcoholic liver disease   . Skin cancer   . TIA (transient ischemic attack)   . Unspecified essential hypertension     Past Surgical History:  Procedure Laterality Date  . BACK SURGERY    . CHOLECYSTECTOMY    . MOHS SURGERY    . TONSILLECTOMY AND ADENOIDECTOMY    . TOTAL ABDOMINAL HYSTERECTOMY W/ BILATERAL SALPINGOOPHORECTOMY      Social History   Social History  . Marital status: Married    Spouse name: Thompson Grayer. Diclemente Sr.  . Number of children: 4  . Years of education:  N/A   Occupational History  .  Retired   Social History Main Topics  . Smoking status: Never Smoker  . Smokeless tobacco: Never Used  . Alcohol use No  . Drug use: No  . Sexual activity: Not Currently   Other Topics Concern  . Not on file   Social History Narrative  . No narrative on file    Family History  Problem Relation Age of Onset  . Stomach cancer Mother   . Cancer Brother   . Breast cancer Sister   . Breast cancer Sister     Review of Systems  Constitutional: Positive for appetite change and diaphoresis. Negative for chills and fever.  HENT: Positive for ear pain, rhinorrhea, sore throat and trouble swallowing. Negative for  congestion, sinus pain and sinus pressure.   Respiratory: Positive for cough (mild). Negative for shortness of breath and wheezing.   Gastrointestinal: Negative for diarrhea and nausea.  Musculoskeletal: Negative for myalgias.  Neurological: Positive for headaches.       Objective:   Vitals:   10/24/16 1123  BP: 124/80  Pulse: 62  Resp: 16  Temp: 98.2 F (36.8 C)   Filed Weights   10/24/16 1123  Weight: 175 lb (79.4 kg)   Body mass index is 31 kg/m.  Wt Readings from Last 3 Encounters:  10/24/16 175 lb (79.4 kg)  10/11/16 180 lb (81.6 kg)  06/21/16 175 lb 8 oz (79.6 kg)     Physical Exam GENERAL APPEARANCE: Appears stated age, well appearing, NAD EYES: conjunctiva clear, no icterus HEENT: bilateral tympanic membranes and ear canals normal, oropharynx with moderate erythema, no thyromegaly, trachea midline, cervical lymphadenopathy LUNGS: Clear to auscultation without wheeze or crackles, unlabored breathing, good air entry bilaterally HEART: Normal S1,S2 without murmurs SKIN:  Dryness right thumb with break in skin - no erythema or swelling EXTREMITIES: Without clubbing, cyanosis, or edema        Assessment & Plan:   See Problem List for Assessment and Plan of chronic medical problems.

## 2016-10-24 NOTE — Progress Notes (Signed)
Pre visit review using our clinic review tool, if applicable. No additional management support is needed unless otherwise documented below in the visit note. 

## 2016-11-28 ENCOUNTER — Other Ambulatory Visit: Payer: Self-pay | Admitting: Internal Medicine

## 2016-12-12 ENCOUNTER — Telehealth: Payer: Self-pay | Admitting: Internal Medicine

## 2016-12-12 NOTE — Telephone Encounter (Signed)
FYI: Patient states that spouse has been sick.  Has had cancer and just had gallbladder taken out.  Patient was requesting something for possible depression and something that she would not gain weight on.  I informed patient she would need to be seen for this.  I have scheduled patient for 6/26 at 9am.

## 2016-12-13 ENCOUNTER — Ambulatory Visit (INDEPENDENT_AMBULATORY_CARE_PROVIDER_SITE_OTHER): Payer: Medicare Other | Admitting: Internal Medicine

## 2016-12-13 ENCOUNTER — Encounter: Payer: Self-pay | Admitting: Internal Medicine

## 2016-12-13 ENCOUNTER — Other Ambulatory Visit (INDEPENDENT_AMBULATORY_CARE_PROVIDER_SITE_OTHER): Payer: Medicare Other

## 2016-12-13 VITALS — BP 124/72 | HR 57 | Temp 97.6°F | Resp 16 | Ht 63.0 in | Wt 173.8 lb

## 2016-12-13 DIAGNOSIS — E785 Hyperlipidemia, unspecified: Secondary | ICD-10-CM

## 2016-12-13 DIAGNOSIS — F329 Major depressive disorder, single episode, unspecified: Secondary | ICD-10-CM

## 2016-12-13 DIAGNOSIS — F32A Depression, unspecified: Secondary | ICD-10-CM

## 2016-12-13 DIAGNOSIS — I2510 Atherosclerotic heart disease of native coronary artery without angina pectoris: Secondary | ICD-10-CM

## 2016-12-13 DIAGNOSIS — I10 Essential (primary) hypertension: Secondary | ICD-10-CM

## 2016-12-13 DIAGNOSIS — F450 Somatization disorder: Secondary | ICD-10-CM | POA: Insufficient documentation

## 2016-12-13 LAB — BASIC METABOLIC PANEL
BUN: 16 mg/dL (ref 6–23)
CHLORIDE: 108 meq/L (ref 96–112)
CO2: 30 mEq/L (ref 19–32)
Calcium: 9.1 mg/dL (ref 8.4–10.5)
Creatinine, Ser: 0.76 mg/dL (ref 0.40–1.20)
GFR: 78.67 mL/min (ref >60.00)
Glucose, Bld: 105 mg/dL — ABNORMAL HIGH (ref 70–99)
POTASSIUM: 4 meq/L (ref 3.5–5.1)
SODIUM: 142 meq/L (ref 135–145)

## 2016-12-13 LAB — LIPID PANEL
CHOLESTEROL: 204 mg/dL — AB (ref 0–200)
HDL: 40.8 mg/dL (ref >39.00)
LDL CALC: 138 mg/dL — AB (ref 0–99)
NonHDL: 163.51
Total CHOL/HDL Ratio: 5
Triglycerides: 130 mg/dL (ref 0.0–149.0)
VLDL: 26 mg/dL (ref 0.0–40.0)

## 2016-12-13 LAB — TSH: TSH: 3.65 u[IU]/mL (ref 0.35–4.50)

## 2016-12-13 MED ORDER — DULOXETINE HCL 30 MG PO CPEP: 30.0000 mg | ORAL_CAPSULE | Freq: Every day | ORAL | 1 refills | Status: DC

## 2016-12-13 NOTE — Patient Instructions (Signed)
Major Depressive Disorder, Adult Major depressive disorder (MDD) is a mental health condition. It may also be called clinical depression or unipolar depression. MDD usually causes feelings of sadness, hopelessness, or helplessness. MDD can also cause physical symptoms. It can interfere with work, school, relationships, and other everyday activities. MDD may be mild, moderate, or severe. It may occur once (single episode major depressive disorder) or it may occur multiple times (recurrent major depressive disorder). What are the causes? The exact cause of this condition is not known. MDD is most likely caused by a combination of things, which may include:  Genetic factors. These are traits that are passed along from parent to child.  Individual factors. Your personality, your behavior, and the way you handle your thoughts and feelings may contribute to MDD. This includes personality traits and behaviors learned from others.  Physical factors, such as: ? Differences in the part of your brain that controls emotion. This part of your brain may be different than it is in people who do not have MDD. ? Long-term (chronic) medical or psychiatric illnesses.  Social factors. Traumatic experiences or major life changes may play a role in the development of MDD.  What increases the risk? This condition is more likely to develop in women. The following factors may also make you more likely to develop MDD:  A family history of depression.  Troubled family relationships.  Abnormally low levels of certain brain chemicals.  Traumatic events in childhood, especially abuse or the loss of a parent.  Being under a lot of stress, or long-term stress, especially from upsetting life experiences or losses.  A history of: ? Chronic physical illness. ? Other mental health disorders. ? Substance abuse.  Poor living conditions.  Experiencing social exclusion or discrimination on a regular basis.  What are  the signs or symptoms? The main symptoms of MDD typically include:  Constant depressed or irritable mood.  Loss of interest in things and activities.  MDD symptoms may also include:  Sleeping or eating too much or too little.  Unexplained weight change.  Fatigue or low energy.  Feelings of worthlessness or guilt.  Difficulty thinking clearly or making decisions.  Thoughts of suicide or of harming others.  Physical agitation or weakness.  Isolation.  Severe cases of MDD may also occur with other symptoms, such as:  Delusions or hallucinations, in which you imagine things that are not real (psychotic depression).  Low-level depression that lasts at least a year (chronic depression or persistent depressive disorder).  Extreme sadness and hopelessness (melancholic depression).  Trouble speaking and moving (catatonic depression).  How is this diagnosed? This condition may be diagnosed based on:  Your symptoms.  Your medical history, including your mental health history. This may involve tests to evaluate your mental health. You may be asked questions about your lifestyle, including any drug and alcohol use, and how long you have had symptoms of MDD.  A physical exam.  Blood tests to rule out other conditions.  You must have a depressed mood and at least four other MDD symptoms most of the day, nearly every day in the same 2-week timeframe before your health care provider can confirm a diagnosis of MDD. How is this treated? This condition is usually treated by mental health professionals, such as psychologists, psychiatrists, and clinical social workers. You may need more than one type of treatment. Treatment may include:  Psychotherapy. This is also called talk therapy or counseling. Types of psychotherapy include: ? Cognitive behavioral   therapy (CBT). This type of therapy teaches you to recognize unhealthy feelings, thoughts, and behaviors, and replace them with  positive thoughts and actions. ? Interpersonal therapy (IPT). This helps you to improve the way you relate to and communicate with others. ? Family therapy. This treatment includes members of your family.  Medicine to treat anxiety and depression, or to help you control certain emotions and behaviors.  Lifestyle changes, such as: ? Limiting alcohol and drug use. ? Exercising regularly. ? Getting plenty of sleep. ? Making healthy eating choices. ? Spending more time outdoors.  Treatments involving stimulation of the brain can be used in situations with extremely severe symptoms, or when medicine or other therapies do not work over time. These treatments include electroconvulsive therapy, transcranial magnetic stimulation, and vagal nerve stimulation. Follow these instructions at home: Activity  Return to your normal activities as told by your health care provider.  Exercise regularly and spend time outdoors as told by your health care provider. General instructions  Take over-the-counter and prescription medicines only as told by your health care provider.  Do not drink alcohol. If you drink alcohol, limit your alcohol intake to no more than 1 drink a day for nonpregnant women and 2 drinks a day for men. One drink equals 12 oz of beer, 5 oz of wine, or 1 oz of hard liquor. Alcohol can affect any antidepressant medicines you are taking. Talk to your health care provider about your alcohol use.  Eat a healthy diet and get plenty of sleep.  Find activities that you enjoy doing, and make time to do them.  Consider joining a support group. Your health care provider may be able to recommend a support group.  Keep all follow-up visits as told by your health care provider. This is important. Where to find more information: National Alliance on Mental Illness  www.nami.org  U.[place]. National Institute of Mental Health  www.nimh.nih.gov  National Suicide Prevention  Lifeline  1-800-273-TALK (8255). This is free, 24-hour help.  Contact a health care provider if:  Your symptoms get worse.  You develop new symptoms. Get help right away if:  You self-harm.  You have serious thoughts about hurting yourself or others.  You see, hear, taste, smell, or feel things that are not present (hallucinate). This information is not intended to replace advice given to you by your health care provider. Make sure you discuss any questions you have with your health care provider. Document Released: 10/01/2012 Document Revised: 02/11/2016 Document Reviewed: 12/16/2015 Elsevier Interactive Patient Education  2017 [employer]  

## 2016-12-13 NOTE — Progress Notes (Signed)
Subjective:  Patient ID: [name], female    DOB: 12/19/1940  Age: 76 y.o. MRN: [id number]  CC: Hypertension and Depression   HPI [name] presents for f/up - She is taking care of her husband who has colon cancer and it is taking a toll on her. Over the last few months she has developed fatigue, anhedonia, crying spells, insomnia with frequent awakenings, mild intermittent headache, loss of appetite, weight loss, and a few episodes of dizziness. She denies chest pain, shortness of breath, DOE, palpitations, or edema. She denies SI or HI. She tells me she's never been treated for depression before.  Outpatient Medications Prior to Visit  Medication Sig Dispense Refill  . ALPRAZolam (XANAX) 0.5 MG tablet Take 1 tablet (0.5 mg total) by mouth 3 (three) times daily as needed. 90 tablet 2  . Brimonidine Tartrate (ALPHAGAN P OP) Apply 1 drop to eye 2 (two) times daily.    . Cyanocobalamin 500 MCG/0.1ML SOLN Place 0.1 mLs (500 mcg total) into the nose once a week. 2.3 mL 11  . diclofenac sodium (VOLTAREN) 1 % GEL Apply 2 g topically 4 (four) times daily. 100 g 1  . furosemide (LASIX) 20 MG tablet TAKE ONE TABLET BY MOUTH ONCE DAILY 30 tablet 3  . meloxicam (MOBIC) 15 MG tablet Take 15 mg by mouth daily.    . potassium chloride SA (K-DUR,KLOR-CON) 20 MEQ tablet Take 1 tablet (20 mEq total) by mouth 2 (two) times daily. 180 tablet 1  . amoxicillin-clavulanate (AUGMENTIN) 875-125 MG tablet Take 1 tablet by mouth 2 (two) times daily. 20 tablet 0  . Tiotropium Bromide Monohydrate (SPIRIVA RESPIMAT) 1.25 MCG/ACT AERS Inhale 2 puffs into the lungs daily. 4 g 11  . triamcinolone ointment (KENALOG) 0.5 % Apply 1 application topically 2 (two) times daily. Do not use for longer than 2 weeks 15 g 0   No facility-administered medications prior to visit.     ROS Review of Systems  Constitutional: Positive for appetite change, fatigue and unexpected weight change. Negative for activity change and  diaphoresis.  HENT: Negative.  Negative for trouble swallowing.   Eyes: Negative for visual disturbance.  Respiratory: Negative.  Negative for cough, chest tightness, shortness of breath and wheezing.   Cardiovascular: Negative for chest pain, palpitations and leg swelling.  Gastrointestinal: Negative for abdominal pain, blood in stool, constipation, diarrhea, nausea and vomiting.  Genitourinary: Negative.   Musculoskeletal: Negative.  Negative for back pain, myalgias and neck pain.  Skin: Negative.  Negative for color change and rash.  Neurological: Positive for dizziness and headaches. Negative for tremors, weakness and light-headedness.  Hematological: Negative for adenopathy. Does not bruise/bleed easily.  Psychiatric/Behavioral: Positive for dysphoric mood and sleep disturbance. Negative for agitation, behavioral problems, confusion, decreased concentration, hallucinations, self-injury and suicidal ideas. The patient is nervous/anxious. The patient is not hyperactive.     Objective:  BP 124/72 (BP Location: Left Arm, Patient Position: Sitting, Cuff Size: Normal)   Pulse (!) 57   Temp 97.6 F (36.4 C) (Oral)   Resp 16   Ht 5\' 3"  (1.6 m)   Wt 173 lb 12 oz (78.8 kg)   SpO2 99%   BMI 30.78 kg/m   BP Readings from Last 3 Encounters:  12/13/16 124/72  10/24/16 124/80  10/11/16 112/60    Wt Readings from Last 3 Encounters:  12/13/16 173 lb 12 oz (78.8 kg)  10/24/16 175 lb (79.4 kg)  10/11/16 180 lb (81.6 kg)  Physical Exam  Constitutional: She is oriented to person, place, and time. No distress.  HENT:  Mouth/Throat: Oropharynx is clear and moist. No oropharyngeal exudate.  Eyes: Conjunctivae are normal. Right eye exhibits no discharge. Left eye exhibits no discharge. No scleral icterus.  Neck: Normal range of motion. Neck supple. No JVD present. No thyromegaly present.  Cardiovascular: Normal rate and regular rhythm.  Exam reveals no gallop and no friction rub.   No  murmur heard. Pulmonary/Chest: Effort normal and breath sounds normal. No respiratory distress. She has no wheezes. She has no rales. She exhibits no tenderness.  Abdominal: Soft. Bowel sounds are normal. She exhibits no distension and no mass. There is no tenderness. There is no rebound and no guarding.  Musculoskeletal: Normal range of motion. She exhibits no edema, tenderness or deformity.  Lymphadenopathy:    She has no cervical adenopathy.  Neurological: She is alert and oriented to person, place, and time.  Skin: Skin is warm and dry. No rash noted. She is not diaphoretic. No erythema. No pallor.  Psychiatric: Judgment normal. Her mood appears anxious. Her affect is not angry. Her speech is not rapid and/or pressured, not delayed and not tangential. She is not agitated, not slowed and not withdrawn. Cognition and memory are normal. She exhibits a depressed mood. She expresses no homicidal and no suicidal ideation. She expresses no suicidal plans and no homicidal plans.  She is anxious,sad, and tearful today  Vitals reviewed.   Lab Results  Component Value Date   WBC 5.0 10/11/2016   HGB 14.1 10/11/2016   HCT 41.9 10/11/2016   PLT 142.0 (L) 10/11/2016   GLUCOSE 105 (H) 12/13/2016   CHOL 204 (H) 12/13/2016   TRIG 130.0 12/13/2016   HDL 40.80 12/13/2016   LDLDIRECT 159.0 12/02/2014   LDLCALC 138 (H) 12/13/2016   ALT 22 11/25/2015   AST 21 11/25/2015   NA 142 12/13/2016   K 4.0 12/13/2016   CL 108 12/13/2016   CREATININE 0.76 12/13/2016   BUN 16 12/13/2016   CO2 30 12/13/2016   TSH 3.65 12/13/2016   INR 1.0 RATIO 04/28/2008   HGBA1C 5.1 07/06/2012    Dg Os Calcis Right  Result Date: 10/12/2016 CLINICAL DATA:  Swelling of the right heel with pain over the last 5 days, no injury EXAM: RIGHT OS CALCIS - 2+ VIEW COMPARISON:  None. FINDINGS: There are degenerative spurs emanating from the plantar aspect as well as the insertion of the Achilles tendon. No acute fracture is seen.  Alignment is normal. The subtalar joint space is unremarkable. IMPRESSION: Degenerative spurs from the insertion of the Achilles tendon and from the plantar aspect of calcaneus. No acute abnormality. Electronically Signed   By: Ivar Drape M.D.   On: 10/12/2016 08:30    Assessment & Plan:   [name] was seen today for hypertension and depression.  Diagnoses and all orders for this visit:  Essential hypertension- her blood pressure is well-controlled, electrolytes and renal function are normal. -     EKG 12-Lead -     Basic metabolic panel; Future  Depression with somatization- will start Cymbalta at the 30 mg dose, anticipate increasing the dose over the next few weeks and months. -     DULoxetine (CYMBALTA) 30 MG capsule; Take 1 capsule (30 mg total) by mouth daily.  Coronary artery disease involving native coronary artery of native heart without angina pectoris- I was not able to do an EKG on her today since we did not  have a functioning EKG machine in the building, I don't think any of her symptoms are related to angina, she will let me know if she develops any chest pain or shortness of breath. -     EKG 12-Lead -     Lipid panel; Future  Hyperlipidemia with target LDL less than 100- she has not achieved her LDL goal, she is not willing to take a statin for CV risk reduction. -     Lipid panel; Future -     TSH; Future   I have discontinued Ms. Vest's Tiotropium Bromide Monohydrate, amoxicillin-clavulanate, and triamcinolone ointment. I am also having her start on DULoxetine. Additionally, I am having her maintain her Brimonidine Tartrate (ALPHAGAN P OP), Cyanocobalamin, diclofenac sodium, potassium chloride SA, ALPRAZolam, meloxicam, and furosemide.  Meds ordered this encounter  Medications  . DULoxetine (CYMBALTA) 30 MG capsule    Sig: Take 1 capsule (30 mg total) by mouth daily.    Dispense:  30 capsule    Refill:  1     Follow-up: Return in about 4 weeks (around  01/10/2017).  Scarlette Calico, MD

## 2016-12-14 ENCOUNTER — Telehealth: Payer: Self-pay | Admitting: Internal Medicine

## 2016-12-14 NOTE — Telephone Encounter (Signed)
LVM for pt to call back as soon as possible.   

## 2016-12-14 NOTE — Telephone Encounter (Signed)
Pt called in and said that the new med that was given to her have diarrhea and not feel well. She is having some side effects and needs to know what she should do?

## 2016-12-15 NOTE — Telephone Encounter (Signed)
Pt called back today to let you know that the medication is working much better today.

## 2016-12-23 ENCOUNTER — Telehealth: Payer: Self-pay

## 2016-12-23 DIAGNOSIS — I10 Essential (primary) hypertension: Secondary | ICD-10-CM

## 2016-12-23 MED ORDER — POTASSIUM CHLORIDE CRYS ER 20 MEQ PO TBCR: 20.0000 meq | EXTENDED_RELEASE_TABLET | Freq: Two times a day (BID) | ORAL | 1 refills | Status: DC

## 2016-12-23 NOTE — Telephone Encounter (Signed)
[facility] ([facility]) rq rf of the potassium.   Erx sent for 90 day supply.

## 2017-01-10 ENCOUNTER — Ambulatory Visit: Payer: Medicare Other | Admitting: Internal Medicine

## 2017-01-16 ENCOUNTER — Ambulatory Visit: Payer: Medicare Other | Admitting: Internal Medicine

## 2017-02-16 IMAGING — DX DG ABDOMEN ACUTE W/ 1V CHEST
3 series · 3 of 3 positions shown · non-contrast
Comparison: Chest x-ray November 11, 2015

CLINICAL DATA: Right lower quadrant pain since yesterday

EXAM:
DG ABDOMEN ACUTE W/ 1V CHEST

[chest pa]
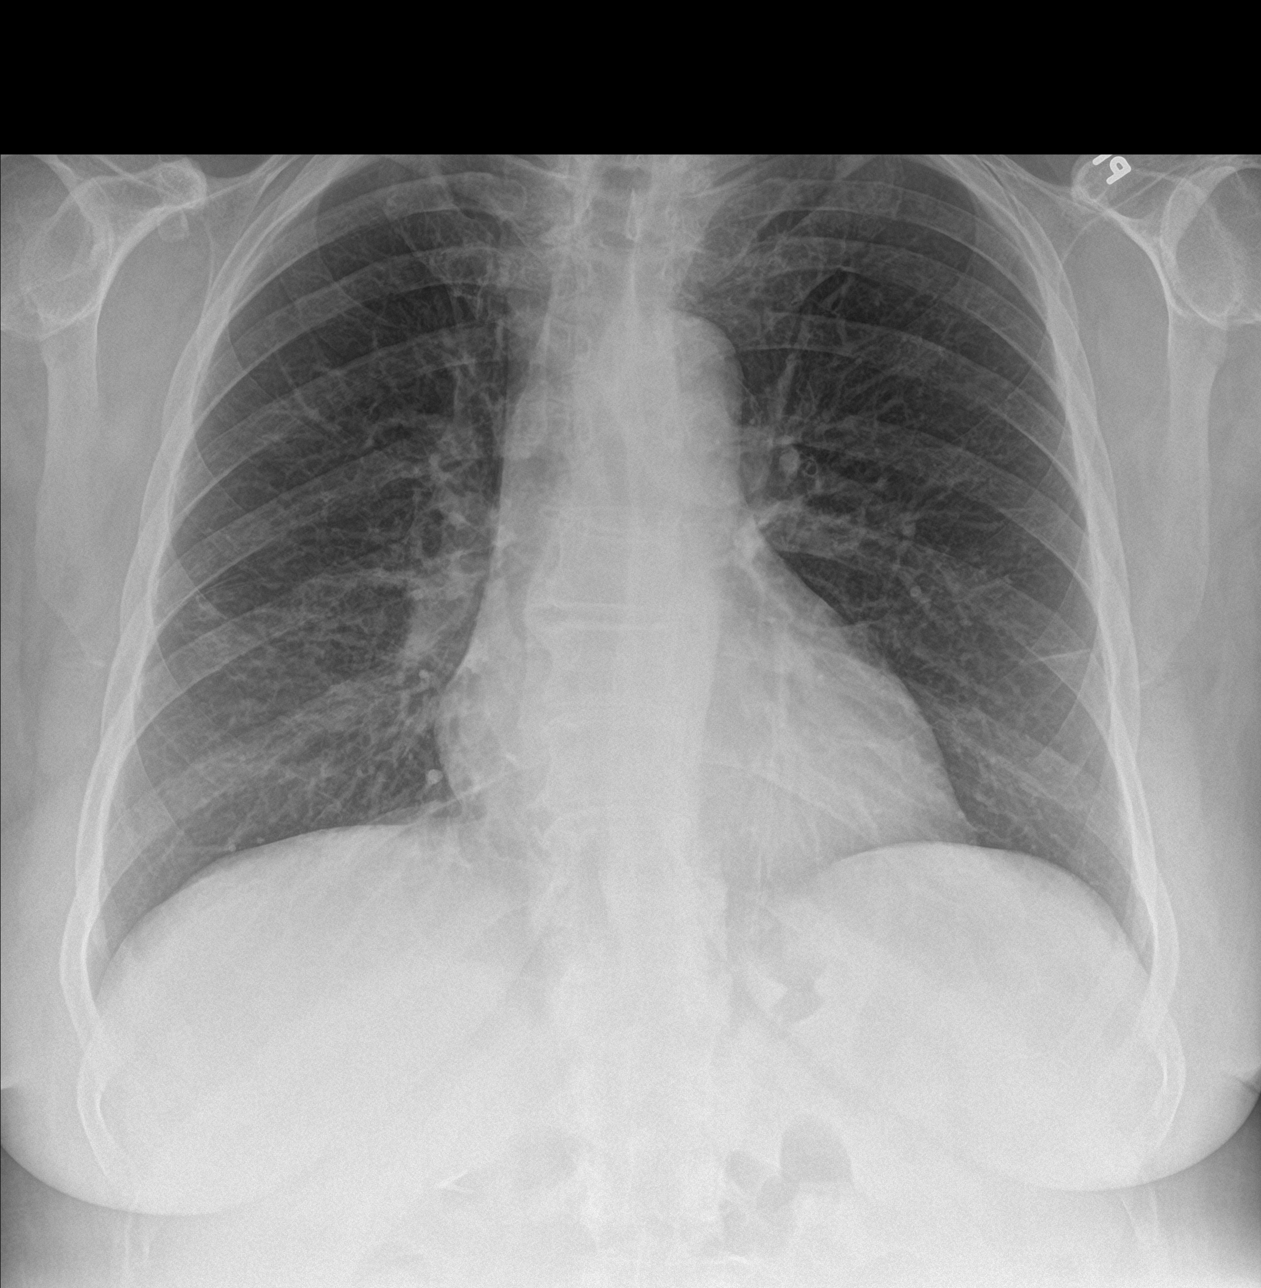

[abdomen erect]
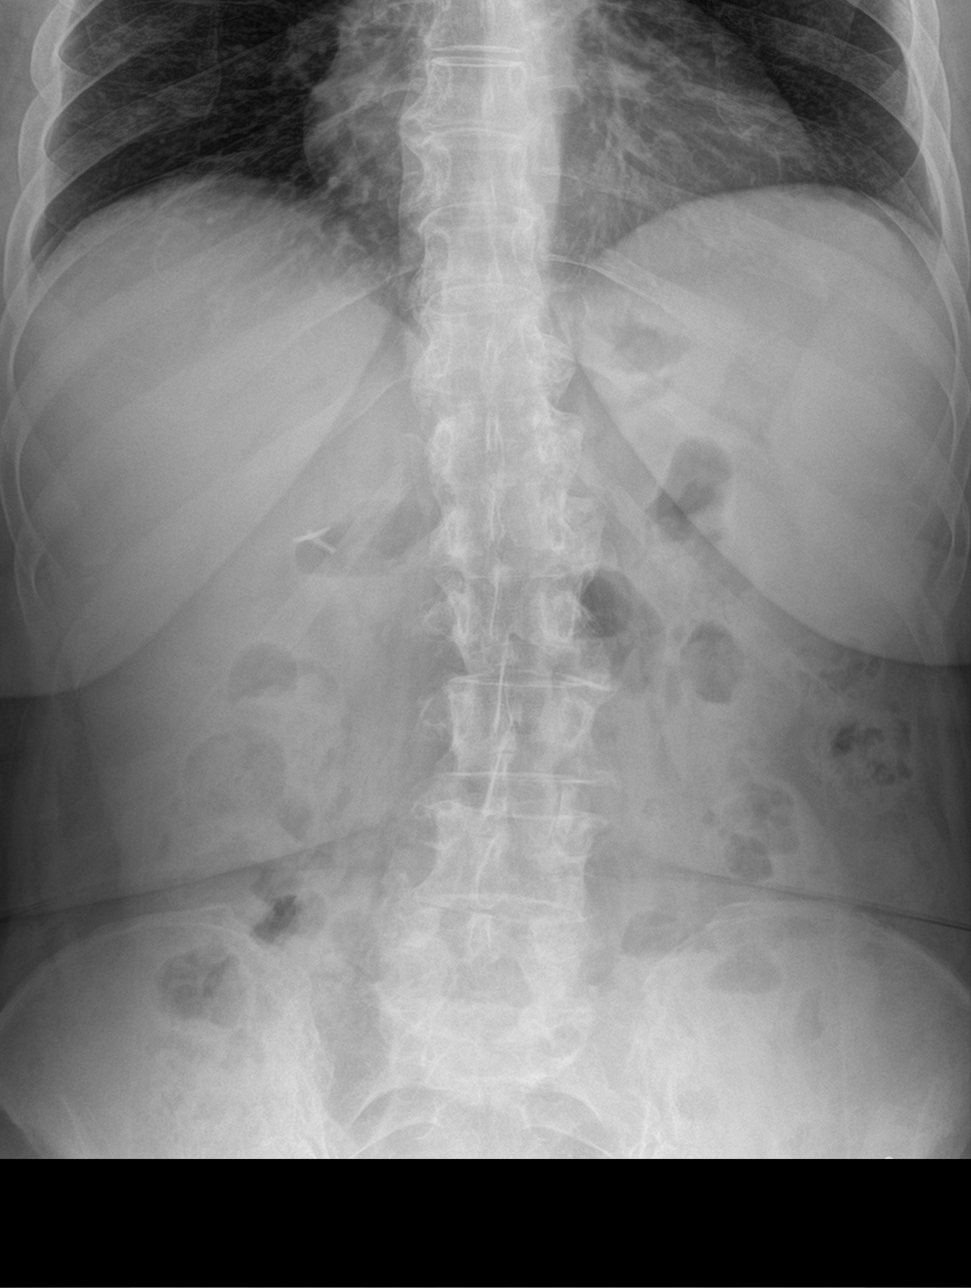

[abdomen supine]
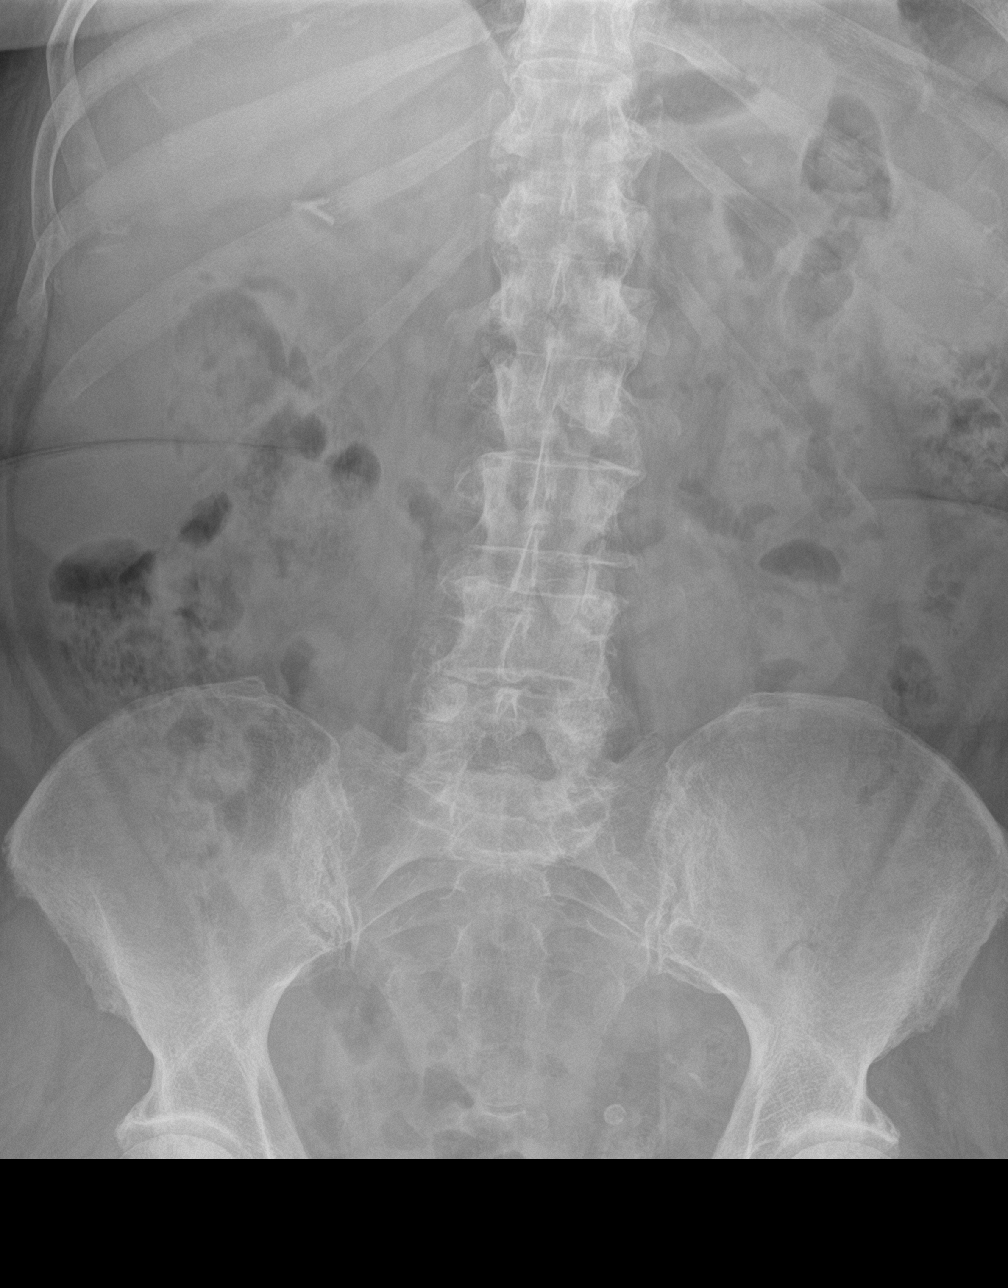

[3 of 3 positions shown; findings below may reference images not displayed]

FINDINGS: There is no evidence of dilated bowel loops or free intraperitoneal
air. No radiopaque calculi or other significant radiographic
abnormality is seen. Heart size and mediastinal contours are within
normal limits. There is no focal infiltrate, pulmonary edema, or
pleural effusion. Mild scar is identified in the left lung base.
Prior cholecystectomy clips are noted. There is scoliosis of spine.
IMPRESSION: Negative abdominal radiographs.  No acute cardiopulmonary disease.

## 2017-02-28 ENCOUNTER — Observation Stay (HOSPITAL_COMMUNITY)
Admission: EM | Admit: 2017-02-28 | Discharge: 2017-03-01 | Disposition: A | Discharge status: Home / Self Care | Payer: Medicare Other | Attending: Internal Medicine | Admitting: Internal Medicine

## 2017-02-28 ENCOUNTER — Ambulatory Visit (INDEPENDENT_AMBULATORY_CARE_PROVIDER_SITE_OTHER): Payer: Medicare Other | Admitting: Family Medicine

## 2017-02-28 ENCOUNTER — Encounter: Payer: Self-pay | Admitting: Family Medicine

## 2017-02-28 ENCOUNTER — Emergency Department (HOSPITAL_COMMUNITY): Payer: Medicare Other

## 2017-02-28 ENCOUNTER — Encounter (HOSPITAL_COMMUNITY): Payer: Self-pay | Admitting: Emergency Medicine

## 2017-02-28 VITALS — BP 136/80 | HR 61 | Temp 98.3°F | Resp 16 | Ht 63.0 in | Wt 176.0 lb

## 2017-02-28 DIAGNOSIS — G44209 Tension-type headache, unspecified, not intractable: Secondary | ICD-10-CM

## 2017-02-28 DIAGNOSIS — G459 Transient cerebral ischemic attack, unspecified: Principal | ICD-10-CM | POA: Diagnosis present

## 2017-02-28 DIAGNOSIS — Z882 Allergy status to sulfonamides status: Secondary | ICD-10-CM | POA: Diagnosis not present

## 2017-02-28 DIAGNOSIS — E669 Obesity, unspecified: Secondary | ICD-10-CM | POA: Insufficient documentation

## 2017-02-28 DIAGNOSIS — F32A Depression, unspecified: Secondary | ICD-10-CM

## 2017-02-28 DIAGNOSIS — R51 Headache: Secondary | ICD-10-CM

## 2017-02-28 DIAGNOSIS — I2510 Atherosclerotic heart disease of native coronary artery without angina pectoris: Secondary | ICD-10-CM | POA: Insufficient documentation

## 2017-02-28 DIAGNOSIS — E785 Hyperlipidemia, unspecified: Secondary | ICD-10-CM | POA: Diagnosis not present

## 2017-02-28 DIAGNOSIS — R519 Headache, unspecified: Secondary | ICD-10-CM

## 2017-02-28 DIAGNOSIS — M797 Fibromyalgia: Secondary | ICD-10-CM | POA: Diagnosis not present

## 2017-02-28 DIAGNOSIS — K760 Fatty (change of) liver, not elsewhere classified: Secondary | ICD-10-CM | POA: Insufficient documentation

## 2017-02-28 DIAGNOSIS — I700 Atherosclerosis of aorta: Secondary | ICD-10-CM | POA: Diagnosis not present

## 2017-02-28 DIAGNOSIS — I10 Essential (primary) hypertension: Secondary | ICD-10-CM | POA: Diagnosis not present

## 2017-02-28 DIAGNOSIS — R4701 Aphasia: Secondary | ICD-10-CM | POA: Diagnosis not present

## 2017-02-28 DIAGNOSIS — Z6831 Body mass index (BMI) 31.0-31.9, adult: Secondary | ICD-10-CM | POA: Diagnosis not present

## 2017-02-28 DIAGNOSIS — F411 Generalized anxiety disorder: Secondary | ICD-10-CM | POA: Insufficient documentation

## 2017-02-28 DIAGNOSIS — D519 Vitamin B12 deficiency anemia, unspecified: Secondary | ICD-10-CM | POA: Insufficient documentation

## 2017-02-28 DIAGNOSIS — F450 Somatization disorder: Secondary | ICD-10-CM | POA: Diagnosis not present

## 2017-02-28 DIAGNOSIS — R471 Dysarthria and anarthria: Secondary | ICD-10-CM | POA: Diagnosis present

## 2017-02-28 DIAGNOSIS — F329 Major depressive disorder, single episode, unspecified: Secondary | ICD-10-CM | POA: Diagnosis not present

## 2017-02-28 LAB — DIFFERENTIAL
BASOS PCT: 0 %
Basophils Absolute: 0 10*3/uL (ref 0.0–0.1)
EOS ABS: 0 10*3/uL (ref 0.0–0.7)
EOS PCT: 1 %
Lymphocytes Relative: 11 %
Lymphs Abs: 0.6 10*3/uL — ABNORMAL LOW (ref 0.7–4.0)
MONO ABS: 0.2 10*3/uL (ref 0.1–1.0)
Monocytes Relative: 5 %
NEUTROS PCT: 84 %
Neutro Abs: 4.5 10*3/uL (ref 1.7–7.7)

## 2017-02-28 LAB — COMPREHENSIVE METABOLIC PANEL
ALBUMIN: 4.1 g/dL (ref 3.5–5.0)
ALT: 23 U/L (ref 14–54)
ANION GAP: 7 (ref 5–15)
AST: 30 U/L (ref 15–41)
Alkaline Phosphatase: 159 U/L — ABNORMAL HIGH (ref 38–126)
BUN: 18 mg/dL (ref 6–20)
CO2: 24 mmol/L (ref 22–32)
Calcium: 9 mg/dL (ref 8.9–10.3)
Chloride: 107 mmol/L (ref 101–111)
Creatinine, Ser: 0.78 mg/dL (ref 0.44–1.00)
GFR calc non Af Amer: >60 mL/min (ref >60)
GLUCOSE: 109 mg/dL — AB (ref 65–99)
POTASSIUM: 3.9 mmol/L (ref 3.5–5.1)
SODIUM: 138 mmol/L (ref 135–145)
TOTAL PROTEIN: 6.6 g/dL (ref 6.5–8.1)
Total Bilirubin: 1 mg/dL (ref 0.3–1.2)

## 2017-02-28 LAB — I-STAT CHEM 8, ED
BUN: 19 mg/dL (ref 6–20)
CHLORIDE: 109 mmol/L (ref 101–111)
Calcium, Ion: 0.94 mmol/L — ABNORMAL LOW (ref 1.15–1.40)
Creatinine, Ser: 0.7 mg/dL (ref 0.44–1.00)
Glucose, Bld: 103 mg/dL — ABNORMAL HIGH (ref 65–99)
HEMATOCRIT: 40 % (ref 36.0–46.0)
Hemoglobin: 13.6 g/dL (ref 12.0–15.0)
Potassium: 4.2 mmol/L (ref 3.5–5.1)
SODIUM: 138 mmol/L (ref 135–145)
TCO2: 22 mmol/L (ref 22–32)

## 2017-02-28 LAB — PROTIME-INR
INR: 0.98
PROTHROMBIN TIME: 12.9 s (ref 11.4–15.2)

## 2017-02-28 LAB — CBC
HCT: 41.2 % (ref 36.0–46.0)
Hemoglobin: 14.2 g/dL (ref 12.0–15.0)
MCH: 29.8 pg (ref 26.0–34.0)
MCHC: 34.5 g/dL (ref 30.0–36.0)
MCV: 86.4 fL (ref 78.0–100.0)
PLATELETS: 115 10*3/uL — AB (ref 150–400)
RBC: 4.77 MIL/uL (ref 3.87–5.11)
RDW: 13.6 % (ref 11.5–15.5)
WBC: 5.4 10*3/uL (ref 4.0–10.5)

## 2017-02-28 LAB — I-STAT TROPONIN, ED: Troponin i, poc: 0 ng/mL (ref 0.00–0.08)

## 2017-02-28 LAB — APTT: APTT: 31 s (ref 24–36)

## 2017-02-28 MED ORDER — FUROSEMIDE 20 MG PO TABS
20.0000 mg | ORAL_TABLET | Freq: Every day | ORAL | Status: DC
  Administered 2017-02-28 – 2017-03-01 (×2): 20 mg via ORAL
  Filled 2017-02-28 (×2): qty 1

## 2017-02-28 MED ORDER — IOPAMIDOL (ISOVUE-370) INJECTION 76%
INTRAVENOUS | Status: AC
  Administered 2017-02-28: 100 mL via INTRAVENOUS
  Filled 2017-02-28: qty 100

## 2017-02-28 MED ORDER — POTASSIUM CHLORIDE CRYS ER 20 MEQ PO TBCR
20.0000 meq | EXTENDED_RELEASE_TABLET | Freq: Two times a day (BID) | ORAL | Status: DC
  Administered 2017-02-28: 20 meq via ORAL
  Filled 2017-02-28: qty 1

## 2017-02-28 MED ORDER — PROCHLORPERAZINE EDISYLATE 5 MG/ML IJ SOLN
10.0000 mg | Freq: Once | INTRAMUSCULAR | Status: AC
  Administered 2017-02-28: 10 mg via INTRAVENOUS
  Filled 2017-02-28: qty 2

## 2017-02-28 MED ORDER — ALPRAZOLAM 0.5 MG PO TABS: 0.5000 mg | ORAL_TABLET | Freq: Three times a day (TID) | ORAL | Status: DC | PRN

## 2017-02-28 MED ORDER — SODIUM CHLORIDE 0.9 % IV SOLN
INTRAVENOUS | Status: DC
  Administered 2017-02-28: 21:00:00 via INTRAVENOUS

## 2017-02-28 MED ORDER — ACETAMINOPHEN 160 MG/5ML PO SOLN: 650.0000 mg | ORAL | Status: DC | PRN

## 2017-02-28 MED ORDER — DIPHENHYDRAMINE HCL 50 MG/ML IJ SOLN
25.0000 mg | Freq: Once | INTRAMUSCULAR | Status: AC
  Administered 2017-02-28: 25 mg via INTRAVENOUS
  Filled 2017-02-28: qty 1

## 2017-02-28 MED ORDER — ACETAMINOPHEN 325 MG PO TABS
650.0000 mg | ORAL_TABLET | ORAL | Status: DC | PRN
  Administered 2017-02-28: 650 mg via ORAL
  Filled 2017-02-28: qty 2

## 2017-02-28 MED ORDER — ASPIRIN EC 81 MG PO TBEC
81.0000 mg | DELAYED_RELEASE_TABLET | Freq: Every day | ORAL | Status: DC
  Administered 2017-03-01: 81 mg via ORAL
  Filled 2017-02-28: qty 1

## 2017-02-28 MED ORDER — DORZOLAMIDE HCL 2 % OP SOLN
1.0000 [drp] | Freq: Three times a day (TID) | OPHTHALMIC | Status: DC
  Administered 2017-02-28 – 2017-03-01 (×2): 1 [drp] via OPHTHALMIC
  Filled 2017-02-28: qty 10

## 2017-02-28 MED ORDER — ACETAMINOPHEN 650 MG RE SUPP: 650.0000 mg | RECTAL | Status: DC | PRN

## 2017-02-28 MED ORDER — KETOROLAC TROMETHAMINE 30 MG/ML IJ SOLN
30.0000 mg | Freq: Once | INTRAMUSCULAR | Status: AC
  Administered 2017-02-28: 30 mg via INTRAMUSCULAR

## 2017-02-28 MED ORDER — BRIMONIDINE TARTRATE 0.15 % OP SOLN
1.0000 [drp] | Freq: Three times a day (TID) | OPHTHALMIC | Status: DC
  Administered 2017-02-28 – 2017-03-01 (×2): 1 [drp] via OPHTHALMIC
  Filled 2017-02-28: qty 5

## 2017-02-28 MED ORDER — STROKE: EARLY STAGES OF RECOVERY BOOK
Freq: Once | Status: AC
  Administered 2017-02-28: 22:00:00
  Filled 2017-02-28: qty 1

## 2017-02-28 MED ORDER — IOPAMIDOL (ISOVUE-370) INJECTION 76%
100.0000 mL | Freq: Once | INTRAVENOUS | Status: AC | PRN
Start: 2017-02-28 — End: 2017-02-28
  Administered 2017-02-28: 100 mL via INTRAVENOUS

## 2017-02-28 MED ORDER — SENNOSIDES-DOCUSATE SODIUM 8.6-50 MG PO TABS: 1.0000 | ORAL_TABLET | Freq: Every evening | ORAL | Status: DC | PRN

## 2017-02-28 NOTE — Patient Instructions (Signed)
Thank you for coming in,   Please follow up with [place] if your headache doesn't improve.   Please seek immediate care if you start demonstrating any of the signs that we spoke of.   Please take tylenol for your headache going forward.    Please feel free to call with any questions or concerns at any time, at [phone]. --Dr. Raeford Razor

## 2017-02-28 NOTE — H&P (Signed)
History and Physical  [name]:[id number] DOB: 26-May-1941 DOA: 02/28/2017  Referring physician: Dr. Billy Fischer, ER physician  PCP: Janith Lima, MD  Outpatient Specialists: none Patient coming from: home & is able to ambulate without assistance  Chief Complaint: difficulty talking and headache   HPI: [name] is a 76 y.o. female with medical history significant of CAD,hyperlipidemia and anxiety who was in her usual state of health and then yesterday afternoon, had an episode where she developed splitting 10/10 headache that was generalized as well as trouble with word finding and numbness and tingling of the fingers in her right hand.  Headache was nonfocal. After less than a minute, patient found herself able to talk again without any issues andher numbness/tingling sensation went away as well. Her headache persisted although decreased somewhat in nature. She initially resisted coming into the hospital, but was convinced by her family to come into the emergency room today. ED Course: in the emergency room, CT scan of the head was unremarkable. Other initial lab work was unremarkable as well. It was suspected the patient had a TIA. She was seen by neurology and an MRI was done which noted no evidence of acute infarct. Neurology recommended CT angiogram of the head and neck which was also unremarkable for significant stenosis on either side. Hospitalist will call for further evaluation and admission for further stroke workup.  Review of Systems: Patient seen after arrival to floor . Pt complains of a mild generalized nonfocal headache, currently about a 3/10   Pt denies any vision changes, current trouble talking, dysphagia, chest pain, palpitations, shortness of breath, wheeze, cough, abdominal pain, hematuria, dysuria, constipation, diarrhea, focal extremity numbness weakness or pain .  Review of systems are otherwise negative   Past Medical History:  Diagnosis Date  .  Anxiety state, unspecified   . CAD (coronary artery disease)   . Disorder of bone and cartilage, unspecified   . Diverticulosis of colon (without mention of hemorrhage)   . Dizziness and giddiness   . Fatty liver   . Fibromyalgia   . Obesity, unspecified   . Other and unspecified hyperlipidemia   . Other chronic nonalcoholic liver disease   . Skin cancer   . TIA (transient ischemic attack)   . Unspecified essential hypertension    Past Surgical History:  Procedure Laterality Date  . BACK SURGERY    . CHOLECYSTECTOMY    . MOHS SURGERY    . TONSILLECTOMY AND ADENOIDECTOMY    . TOTAL ABDOMINAL HYSTERECTOMY W/ BILATERAL SALPINGOOPHORECTOMY      Social History:  reports that she has never smoked. She has never used smokeless tobacco. She reports that she does not drink alcohol or use drugs. patient lives at home with her husband and is normally quite active. She does not require any assistive devices to ambulate   Allergies  Allergen Reactions  . Diltiazem Hcl Other (See Comments)    REACTION: pt states it made her dizzy...  . Statins     Muscle aches  . Zetia [Ezetimibe]     "it made me feel bad"  . Codeine Nausea Only  . Sulfonamide Derivatives Hives, Itching and Rash  . Tape Itching  . Tramadol Itching    Family History  Problem Relation Age of Onset  . Stomach cancer Mother   . Cancer Brother   . Breast cancer Sister   . Breast cancer Sister       Prior to Admission medications   Medication  Sig Start Date End Date Taking? Authorizing Provider  ALPRAZolam Duanne Moron) 0.5 MG tablet Take 1 tablet (0.5 mg total) by mouth 3 (three) times daily as needed. 10/13/16  Yes Janith Lima, MD  brimonidine (ALPHAGAN) 0.15 % ophthalmic solution Place 1 drop into both eyes 3 (three) times daily. 01/20/17  Yes [provider]  Cyanocobalamin (B-12 COMPLIANCE INJECTION) 1000 MCG/ML KIT Inject 1,000 mcg as directed every 30 (thirty) days.   Yes [provider]    dorzolamide (TRUSOPT) 2 % ophthalmic solution Place 1 drop into both eyes 3 (three) times daily. 02/06/17  Yes [provider]  furosemide (LASIX) 20 MG tablet TAKE ONE TABLET BY MOUTH ONCE DAILY 11/28/16  Yes Janith Lima, MD  ibuprofen (ADVIL,MOTRIN) 200 MG tablet Take 400 mg by mouth every 6 (six) hours as needed for headache.   Yes [provider]  potassium chloride SA (K-DUR,KLOR-CON) 20 MEQ tablet Take 1 tablet (20 mEq total) by mouth 2 (two) times daily. 12/23/16  Yes Janith Lima, MD    Physical Exam: BP (!) 154/60 (BP Location: Right Arm)   Pulse (!) 56   Temp 98.4 F (36.9 C) (Oral)   Resp 19   Ht 5' 3"  (1.6 m)   Wt 81.4 kg (179 lb 7.3 oz)   SpO2 100%   BMI 31.79 kg/m   General:  Alert and oriented 3, no acute distress  Eyes: sclera nonicteric, extraocular movements are intact, cranial nerves II through XII are intact  ENT: normocephalic, atraumatic, mucous members are moist  Neck: supple, mild JVD on left side. No carotid bruits  Cardiovascular: regular rate and rhythm, S1-S2  Respiratory: clear to auscultation bilaterally  Abdomen: soft, nontender, nondistended, positive bowel sounds  Skin: no skin breaks, tears or lesions  Musculoskeletal: no clubbing or cyanosis or edema. Grip, flexion and extension for upper and lower extremities is symmetric and 5/5 Psychiatric: patient is appropriate, no evidence of psychoses  Neurologic: no focal deficits. Normal finger to nose bilaterally. Intact sensation. Downgoing toes.         Labs on Admission:  Basic Metabolic Panel:  Recent Labs Lab 02/28/17 1051 02/28/17 1103  NA 138 138  K 3.9 4.2  CL 107 109  CO2 24  --   GLUCOSE 109* 103*  BUN 18 19  CREATININE 0.78 0.70  CALCIUM 9.0  --    Liver Function Tests:  Recent Labs Lab 02/28/17 1051  AST 30  ALT 23  ALKPHOS 159*  BILITOT 1.0  PROT 6.6  ALBUMIN 4.1   No results for input(s): LIPASE, AMYLASE in the last 168 hours. No results for  input(s): AMMONIA in the last 168 hours. CBC:  Recent Labs Lab 02/28/17 1051 02/28/17 1103  WBC 5.4  --   NEUTROABS 4.5  --   HGB 14.2 13.6  HCT 41.2 40.0  MCV 86.4  --   PLT 115*  --    Cardiac Enzymes: No results for input(s): CKTOTAL, CKMB, CKMBINDEX, TROPONINI in the last 168 hours.  BNP (last 3 results) No results for input(s): BNP in the last 8760 hours.  ProBNP (last 3 results) No results for input(s): PROBNP in the last 8760 hours.  CBG: No results for input(s): GLUCAP in the last 168 hours.  Radiological Exams on Admission: Ct Angio Head W Or Wo Contrast  Result Date: 02/28/2017 CLINICAL DATA:  Headache and aphasia. EXAM: CT ANGIOGRAPHY HEAD AND NECK TECHNIQUE: Multidetector CT imaging of the head and neck was performed  using the standard protocol during bolus administration of intravenous contrast. Multiplanar CT image reconstructions and MIPs were obtained to evaluate the vascular anatomy. Carotid stenosis measurements (when applicable) are obtained utilizing NASCET criteria, using the distal internal carotid diameter as the denominator. CONTRAST:  100 mL Isovue 370 COMPARISON:  Head CT and MRI earlier today.  Head MRA 07/14/2011. FINDINGS: CTA NECK FINDINGS Aortic arch: Standard 3 vessel aortic arch with mild atherosclerotic plaque. Patent brachiocephalic and subclavian arteries. Eccentric calcified plaque in the proximal left subclavian artery resulting in less than 50% narrowing. Right carotid system: Patent without evidence of dissection or stenosis. Mild calcified plaque at the carotid bifurcation. Left carotid system: Patent without evidence of dissection, stenosis, or significant atherosclerosis. Vertebral arteries: Patent without definite evidence of stenosis or dissection. The proximal left V1 segment is partially obscured by streak artifact from adjacent venous contrast. Skeleton: Mild multilevel cervical disc and facet degeneration. Other neck: No mass or lymph node  enlargement. Upper chest: Motion artifact in the lung apices. Punctate calcified granuloma in the right lung apex. Review of the MIP images confirms the above findings CTA HEAD FINDINGS Anterior circulation: The internal carotid arteries are patent from skullbase to carotid termini. There is mild-to-moderate calcified atherosclerosis involving the carotid siphons without significant stenosis. The ACAs and MCAs are patent without evidence of proximal branch occlusion or significant proximal stenosis. The right ACA is dominant. No aneurysm. Posterior circulation: The intracranial vertebral arteries are widely patent to the basilar. Patent PICA, AICA, and SCA origins are visualized bilaterally. The basilar artery is somewhat small in caliber diffusely on a developmental basis without focal stenosis. There are fetal type origins of the PCAs with extremely hypoplastic P1 segments bilaterally. No significant PCA stenosis is seen. No aneurysm. Venous sinuses: Patent. Anatomic variants: Fetal origins of the PCAs.  Hypoplastic left ACA. Delayed phase: No abnormal enhancement. Review of the MIP images confirms the above findings IMPRESSION: 1. Mild intracranial and cervical carotid artery atherosclerosis without large vessel occlusion or significant stenosis. 2.  Aortic Atherosclerosis (ICD10-I70.0). Electronically Signed   By: Logan Bores M.D.   On: 02/28/2017 16:08   Ct Head Wo Contrast  Result Date: 02/28/2017 CLINICAL DATA:  Headache and aphasia. EXAM: CT HEAD WITHOUT CONTRAST TECHNIQUE: Contiguous axial images were obtained from the base of the skull through the vertex without intravenous contrast. COMPARISON:  MRI orbits dated December 05, 2013. MRI brain dated July 14, 2011. FINDINGS: Brain: No evidence of acute infarction, hemorrhage, hydrocephalus, extra-axial collection or mass lesion/mass effect. Mild periventricular white matter and corona radiata hypodensities favor chronic ischemic microvascular white matter  disease. Vascular: No hyperdense vessel. Atherosclerotic vascular calcification of the carotid siphons. Skull: Normal. Negative for fracture or focal lesion. Sinuses/Orbits: The bilateral paranasal sinuses and mastoid air cells are clear. The orbits are unremarkable. Other: None. IMPRESSION: 1.  No acute intracranial abnormality. Electronically Signed   By: Titus Dubin M.D.   On: 02/28/2017 11:17   Ct Angio Neck W And/or Wo Contrast  Result Date: 02/28/2017 CLINICAL DATA:  Headache and aphasia. EXAM: CT ANGIOGRAPHY HEAD AND NECK TECHNIQUE: Multidetector CT imaging of the head and neck was performed using the standard protocol during bolus administration of intravenous contrast. Multiplanar CT image reconstructions and MIPs were obtained to evaluate the vascular anatomy. Carotid stenosis measurements (when applicable) are obtained utilizing NASCET criteria, using the distal internal carotid diameter as the denominator. CONTRAST:  100 mL Isovue 370 COMPARISON:  Head CT and MRI earlier today.  Head MRA  07/14/2011. FINDINGS: CTA NECK FINDINGS Aortic arch: Standard 3 vessel aortic arch with mild atherosclerotic plaque. Patent brachiocephalic and subclavian arteries. Eccentric calcified plaque in the proximal left subclavian artery resulting in less than 50% narrowing. Right carotid system: Patent without evidence of dissection or stenosis. Mild calcified plaque at the carotid bifurcation. Left carotid system: Patent without evidence of dissection, stenosis, or significant atherosclerosis. Vertebral arteries: Patent without definite evidence of stenosis or dissection. The proximal left V1 segment is partially obscured by streak artifact from adjacent venous contrast. Skeleton: Mild multilevel cervical disc and facet degeneration. Other neck: No mass or lymph node enlargement. Upper chest: Motion artifact in the lung apices. Punctate calcified granuloma in the right lung apex. Review of the MIP images confirms the  above findings CTA HEAD FINDINGS Anterior circulation: The internal carotid arteries are patent from skullbase to carotid termini. There is mild-to-moderate calcified atherosclerosis involving the carotid siphons without significant stenosis. The ACAs and MCAs are patent without evidence of proximal branch occlusion or significant proximal stenosis. The right ACA is dominant. No aneurysm. Posterior circulation: The intracranial vertebral arteries are widely patent to the basilar. Patent PICA, AICA, and SCA origins are visualized bilaterally. The basilar artery is somewhat small in caliber diffusely on a developmental basis without focal stenosis. There are fetal type origins of the PCAs with extremely hypoplastic P1 segments bilaterally. No significant PCA stenosis is seen. No aneurysm. Venous sinuses: Patent. Anatomic variants: Fetal origins of the PCAs.  Hypoplastic left ACA. Delayed phase: No abnormal enhancement. Review of the MIP images confirms the above findings IMPRESSION: 1. Mild intracranial and cervical carotid artery atherosclerosis without large vessel occlusion or significant stenosis. 2.  Aortic Atherosclerosis (ICD10-I70.0). Electronically Signed   By: Logan Bores M.D.   On: 02/28/2017 16:08   Mr Brain Wo Contrast  Result Date: 02/28/2017 CLINICAL DATA:  Headache and aphasia. EXAM: MRI HEAD WITHOUT CONTRAST TECHNIQUE: Multiplanar, multiecho pulse sequences of the brain and surrounding structures were obtained without intravenous contrast. COMPARISON:  Head CT 02/28/2017. MRI orbits 12/05/2013. MRI brain 07/14/2011. FINDINGS: Brain: There is no evidence of acute infarct, intracranial hemorrhage, mass, midline shift, or extra-axial fluid collection. The ventricles and sulci are normal. Small foci of T2 hyperintensity scattered throughout the subcortical and deep cerebral white matter bilaterally have slightly progressed from 2013 and are nonspecific but compatible with mild chronic small vessel  ischemic disease. Vascular: Major intracranial vascular flow voids are preserved. Skull and upper cervical spine: Unremarkable bone marrow signal. Sinuses/Orbits: Unremarkable orbits. Paranasal sinuses and mastoid air cells are clear. Other: None. IMPRESSION: 1. No acute intracranial abnormality. 2. Mild chronic small vessel ischemic disease, slightly progressed from 2013. Electronically Signed   By: Logan Bores M.D.   On: 02/28/2017 12:05    EKG: Independently reviewed. Sinus rhythm mildly prolonged PR interval   Assessment/Plan Present on Admission: . TIA (transient ischemic attack)causing dysarthric speech: Stable. Have started daily aspirin. Waiting for echocardiogram. Suspect small vessel disease. Patient with no previous history of diabetes. Mild hypertension. She doesn't history of hyperlipidemia however has not been able tolerate statin. She may end up if her numbers are elevated on a low-dose statin to see if she can tolerate it despite myalgias. Cleared by nursing screening for swallowing. Also needs PT eval. Neurology will follow-up tomorrow . HTN (hypertension): As above . CAD (coronary artery disease): Stable . Hyperlipidemia with target LDL less than 100: As above . OBESITY: Patient is criteria with BMI greater than 30 . B12 deficiency anemia:  Continue supplemental B-12 . GAD (generalized anxiety disorder): Continue when necessary Xanax   Principal Problem:   TIA (transient ischemic attack) Active Problems:   Hyperlipidemia with target LDL less than 100   OBESITY   HTN (hypertension)   CAD (coronary artery disease)   B12 deficiency anemia   GAD (generalized anxiety disorder)   Dysarthria   DVT prophylaxis: SCDs   Code Status: full code as confirmed by patient   Family Communication: husband and multiple children at the bedside   Disposition Plan: anticipate discharge tomorrow following completion of TIA workup   Consults called: neurology   Admission status: given  anticipation of discharge tomorrow, place under observation     Annita Brod MD [facility] Pager [phone]  If 7PM-7AM, please contact night-coverage [email or web address] Password [facility]  02/28/2017, 8:48 PM

## 2017-02-28 NOTE — ED Notes (Signed)
Call [facility] and they state they already been call by Santiago Glad and it will be a while before they can get here.

## 2017-02-28 NOTE — ED Notes (Signed)
Labs attempted to be drawn from IV site w/o success.  Pt had to be stuck 2 other times but not enough blood was obtained.  Pt states this is common and she has to be stuck 4-5 times sometimes to get blood.

## 2017-02-28 NOTE — ED Triage Notes (Signed)
Patient was sent from DR Raeford Razor at [facility] for MRI to rule out stroke due to patient having headache and aphasia since yesterday at 5pm. Patient has no neuro deficits at this time in triage.

## 2017-02-28 NOTE — ED Provider Notes (Signed)
[facility] DEPT Provider Note   CSN: [id number] Arrival date & time: 02/28/17  0950     History   Chief Complaint Chief Complaint  Patient presents with  . sent from PCP to rule out stroke    HPI [name] is a 76 y.o. female.  HPI   76 year old female with a history of coronary artery disease, hypertension, hyperlipidemia, prior TIA presents with concern for episode yesterday of difficulty finding words, right hand numbness, followed by severe headache. Reports her symptoms started at 5:30 PM. Describes the headache as the worst headache she's ever had, throbbing in nature, located frontally. Reports that the difficulty finding words, and right hand numbness only lasted less than 10 minutes, and she has not had other neurologic symptoms since then. Denies visual changes, difficulty walking, other numbness, weakness, facial droop. Reports she hasn't had similar symptoms to this, but 5 years ago had an admission for headache, difficulty speaking and right leg weakness for which she was admitted for TIA. Does report she is under stress with sick husband.  Past Medical History:  Diagnosis Date  . Anxiety state, unspecified   . CAD (coronary artery disease)   . Disorder of bone and cartilage, unspecified   . Diverticulosis of colon (without mention of hemorrhage)   . Dizziness and giddiness   . Fatty liver   . Fibromyalgia   . Obesity, unspecified   . Other and unspecified hyperlipidemia   . Other chronic nonalcoholic liver disease   . Skin cancer   . TIA (transient ischemic attack)   . Unspecified essential hypertension     Patient Active Problem List   Diagnosis Date Noted  . TIA (transient ischemic attack) 02/28/2017  . Depression with somatization 12/13/2016  . Allergic rhinitis due to pollen 06/21/2016  . Simple chronic bronchitis ([place]) 06/21/2016  . Headache 03/05/2016  . Routine general medical examination at a health care facility 11/29/2015  . GAD  (generalized anxiety disorder) 12/02/2014  . IBS (irritable bowel syndrome) 01/20/2014  . B12 deficiency anemia 07/10/2013  . Unspecified vitamin D deficiency 01/04/2013  . HTN (hypertension) 07/26/2011  . CAD (coronary artery disease) 07/26/2011  . FATTY LIVER DISEASE 06/20/2009  . OBESITY 09/08/2007  . OSTEOPENIA 09/07/2007  . Hyperlipidemia with target LDL less than 100 07/30/2007    Past Surgical History:  Procedure Laterality Date  . BACK SURGERY    . CHOLECYSTECTOMY    . MOHS SURGERY    . TONSILLECTOMY AND ADENOIDECTOMY    . TOTAL ABDOMINAL HYSTERECTOMY W/ BILATERAL SALPINGOOPHORECTOMY      OB History    No data available       Home Medications    Prior to Admission medications   Medication Sig Start Date End Date Taking? Authorizing Provider  ALPRAZolam Duanne Moron) 0.5 MG tablet Take 1 tablet (0.5 mg total) by mouth 3 (three) times daily as needed. 10/13/16  Yes Janith Lima, MD  brimonidine (ALPHAGAN) 0.15 % ophthalmic solution Place 1 drop into both eyes 3 (three) times daily. 01/20/17  Yes [provider]  Cyanocobalamin (B-12 COMPLIANCE INJECTION) 1000 MCG/ML KIT Inject 1,000 mcg as directed every 30 (thirty) days.   Yes [provider]  dorzolamide (TRUSOPT) 2 % ophthalmic solution Place 1 drop into both eyes 3 (three) times daily. 02/06/17  Yes [provider]  furosemide (LASIX) 20 MG tablet TAKE ONE TABLET BY MOUTH ONCE DAILY 11/28/16  Yes Janith Lima, MD  ibuprofen (ADVIL,MOTRIN) 200 MG tablet Take 400 mg  by mouth every 6 (six) hours as needed for headache.   Yes [provider]  potassium chloride SA (K-DUR,KLOR-CON) 20 MEQ tablet Take 1 tablet (20 mEq total) by mouth 2 (two) times daily. 12/23/16  Yes Janith Lima, MD    Family History Family History  Problem Relation Age of Onset  . Stomach cancer Mother   . Cancer Brother   . Breast cancer Sister   . Breast cancer Sister     Social History Social History    Substance Use Topics  . Smoking status: Never Smoker  . Smokeless tobacco: Never Used  . Alcohol use No     Allergies   Diltiazem hcl; Statins; Zetia [ezetimibe]; Codeine; Sulfonamide derivatives; Tape; and Tramadol   Review of Systems Review of Systems  Constitutional: Negative for fever.  HENT: Negative for sore throat.   Eyes: Negative for visual disturbance.  Respiratory: Negative for cough and shortness of breath.   Cardiovascular: Negative for chest pain.  Gastrointestinal: Positive for nausea. Negative for abdominal pain and vomiting.  Genitourinary: Negative for difficulty urinating.  Musculoskeletal: Negative for back pain and neck pain.  Skin: Negative for rash.  Neurological: Positive for speech difficulty, numbness and headaches. Negative for dizziness, syncope, facial asymmetry and weakness.     Physical Exam Updated Vital Signs BP (!) 154/60 (BP Location: Right Arm)   Pulse (!) 56   Temp 98.4 F (36.9 C) (Oral)   Resp 19   Ht 5' 3"  (1.6 m)   Wt 81.4 kg (179 lb 7.3 oz)   SpO2 100%   BMI 31.79 kg/m   Physical Exam  Constitutional: She is oriented to person, place, and time. She appears well-developed and well-nourished. No distress.  HENT:  Head: Normocephalic and atraumatic.  Eyes: Conjunctivae and EOM are normal.  Neck: Normal range of motion.  Cardiovascular: Normal rate, regular rhythm, normal heart sounds and intact distal pulses.  Exam reveals no gallop and no friction rub.   No murmur heard. Pulmonary/Chest: Effort normal and breath sounds normal. No respiratory distress. She has no wheezes. She has no rales.  Abdominal: Soft. She exhibits no distension. There is no tenderness. There is no guarding.  Musculoskeletal: She exhibits no edema or tenderness.  Neurological: She is alert and oriented to person, place, and time. She has normal strength. No cranial nerve deficit or sensory deficit. Coordination and gait normal. GCS eye subscore is 4. GCS  verbal subscore is 5. GCS motor subscore is 6.  Skin: Skin is warm and dry. No rash noted. She is not diaphoretic. No erythema.  Nursing note and vitals reviewed.    ED Treatments / Results  Labs (all labs ordered are listed, but only abnormal results are displayed) Labs Reviewed  CBC - Abnormal; Notable for the following:       Result Value   Platelets 115 (*)    All other components within normal limits  DIFFERENTIAL - Abnormal; Notable for the following:    Lymphs Abs 0.6 (*)    All other components within normal limits  COMPREHENSIVE METABOLIC PANEL - Abnormal; Notable for the following:    Glucose, Bld 109 (*)    Alkaline Phosphatase 159 (*)    All other components within normal limits  I-STAT CHEM 8, ED - Abnormal; Notable for the following:    Glucose, Bld 103 (*)    Calcium, Ion 0.94 (*)    All other components within normal limits  PROTIME-INR  APTT  HEMOGLOBIN A1C  LIPID PANEL  I-STAT TROPONIN, ED    EKG  EKG Interpretation  Date/Time:  Tuesday February 28 2017 10:27:14 EDT Ventricular Rate:  60 PR Interval:    QRS Duration: 106 QT Interval:  449 QTC Calculation: 449 R Axis:   -39 Text Interpretation:  Sinus rhythm Prolonged PR interval Inferior infarct, old Lateral leads are also involved Since prior ECG, rhythm no longer apperas paced Confirmed by Gareth Morgan [phone]) on 02/28/2017 10:52:39 AM       Radiology Ct Angio Head W Or Wo Contrast  Result Date: 02/28/2017 CLINICAL DATA:  Headache and aphasia. EXAM: CT ANGIOGRAPHY HEAD AND NECK TECHNIQUE: Multidetector CT imaging of the head and neck was performed using the standard protocol during bolus administration of intravenous contrast. Multiplanar CT image reconstructions and MIPs were obtained to evaluate the vascular anatomy. Carotid stenosis measurements (when applicable) are obtained utilizing NASCET criteria, using the distal internal carotid diameter as the denominator. CONTRAST:  100 mL Isovue  370 COMPARISON:  Head CT and MRI earlier today.  Head MRA 07/14/2011. FINDINGS: CTA NECK FINDINGS Aortic arch: Standard 3 vessel aortic arch with mild atherosclerotic plaque. Patent brachiocephalic and subclavian arteries. Eccentric calcified plaque in the proximal left subclavian artery resulting in less than 50% narrowing. Right carotid system: Patent without evidence of dissection or stenosis. Mild calcified plaque at the carotid bifurcation. Left carotid system: Patent without evidence of dissection, stenosis, or significant atherosclerosis. Vertebral arteries: Patent without definite evidence of stenosis or dissection. The proximal left V1 segment is partially obscured by streak artifact from adjacent venous contrast. Skeleton: Mild multilevel cervical disc and facet degeneration. Other neck: No mass or lymph node enlargement. Upper chest: Motion artifact in the lung apices. Punctate calcified granuloma in the right lung apex. Review of the MIP images confirms the above findings CTA HEAD FINDINGS Anterior circulation: The internal carotid arteries are patent from skullbase to carotid termini. There is mild-to-moderate calcified atherosclerosis involving the carotid siphons without significant stenosis. The ACAs and MCAs are patent without evidence of proximal branch occlusion or significant proximal stenosis. The right ACA is dominant. No aneurysm. Posterior circulation: The intracranial vertebral arteries are widely patent to the basilar. Patent PICA, AICA, and SCA origins are visualized bilaterally. The basilar artery is somewhat small in caliber diffusely on a developmental basis without focal stenosis. There are fetal type origins of the PCAs with extremely hypoplastic P1 segments bilaterally. No significant PCA stenosis is seen. No aneurysm. Venous sinuses: Patent. Anatomic variants: Fetal origins of the PCAs.  Hypoplastic left ACA. Delayed phase: No abnormal enhancement. Review of the MIP images confirms  the above findings IMPRESSION: 1. Mild intracranial and cervical carotid artery atherosclerosis without large vessel occlusion or significant stenosis. 2.  Aortic Atherosclerosis (ICD10-I70.0). Electronically Signed   By: Logan Bores M.D.   On: 02/28/2017 16:08   Ct Head Wo Contrast  Result Date: 02/28/2017 CLINICAL DATA:  Headache and aphasia. EXAM: CT HEAD WITHOUT CONTRAST TECHNIQUE: Contiguous axial images were obtained from the base of the skull through the vertex without intravenous contrast. COMPARISON:  MRI orbits dated December 05, 2013. MRI brain dated July 14, 2011. FINDINGS: Brain: No evidence of acute infarction, hemorrhage, hydrocephalus, extra-axial collection or mass lesion/mass effect. Mild periventricular white matter and corona radiata hypodensities favor chronic ischemic microvascular white matter disease. Vascular: No hyperdense vessel. Atherosclerotic vascular calcification of the carotid siphons. Skull: Normal. Negative for fracture or focal lesion. Sinuses/Orbits: The bilateral paranasal sinuses and mastoid air cells are clear. The orbits  are unremarkable. Other: None. IMPRESSION: 1.  No acute intracranial abnormality. Electronically Signed   By: Titus Dubin M.D.   On: 02/28/2017 11:17   Ct Angio Neck W And/or Wo Contrast  Result Date: 02/28/2017 CLINICAL DATA:  Headache and aphasia. EXAM: CT ANGIOGRAPHY HEAD AND NECK TECHNIQUE: Multidetector CT imaging of the head and neck was performed using the standard protocol during bolus administration of intravenous contrast. Multiplanar CT image reconstructions and MIPs were obtained to evaluate the vascular anatomy. Carotid stenosis measurements (when applicable) are obtained utilizing NASCET criteria, using the distal internal carotid diameter as the denominator. CONTRAST:  100 mL Isovue 370 COMPARISON:  Head CT and MRI earlier today.  Head MRA 07/14/2011. FINDINGS: CTA NECK FINDINGS Aortic arch: Standard 3 vessel aortic arch with mild  atherosclerotic plaque. Patent brachiocephalic and subclavian arteries. Eccentric calcified plaque in the proximal left subclavian artery resulting in less than 50% narrowing. Right carotid system: Patent without evidence of dissection or stenosis. Mild calcified plaque at the carotid bifurcation. Left carotid system: Patent without evidence of dissection, stenosis, or significant atherosclerosis. Vertebral arteries: Patent without definite evidence of stenosis or dissection. The proximal left V1 segment is partially obscured by streak artifact from adjacent venous contrast. Skeleton: Mild multilevel cervical disc and facet degeneration. Other neck: No mass or lymph node enlargement. Upper chest: Motion artifact in the lung apices. Punctate calcified granuloma in the right lung apex. Review of the MIP images confirms the above findings CTA HEAD FINDINGS Anterior circulation: The internal carotid arteries are patent from skullbase to carotid termini. There is mild-to-moderate calcified atherosclerosis involving the carotid siphons without significant stenosis. The ACAs and MCAs are patent without evidence of proximal branch occlusion or significant proximal stenosis. The right ACA is dominant. No aneurysm. Posterior circulation: The intracranial vertebral arteries are widely patent to the basilar. Patent PICA, AICA, and SCA origins are visualized bilaterally. The basilar artery is somewhat small in caliber diffusely on a developmental basis without focal stenosis. There are fetal type origins of the PCAs with extremely hypoplastic P1 segments bilaterally. No significant PCA stenosis is seen. No aneurysm. Venous sinuses: Patent. Anatomic variants: Fetal origins of the PCAs.  Hypoplastic left ACA. Delayed phase: No abnormal enhancement. Review of the MIP images confirms the above findings IMPRESSION: 1. Mild intracranial and cervical carotid artery atherosclerosis without large vessel occlusion or significant stenosis.  2.  Aortic Atherosclerosis (ICD10-I70.0). Electronically Signed   By: Logan Bores M.D.   On: 02/28/2017 16:08   Mr Brain Wo Contrast  Result Date: 02/28/2017 CLINICAL DATA:  Headache and aphasia. EXAM: MRI HEAD WITHOUT CONTRAST TECHNIQUE: Multiplanar, multiecho pulse sequences of the brain and surrounding structures were obtained without intravenous contrast. COMPARISON:  Head CT 02/28/2017. MRI orbits 12/05/2013. MRI brain 07/14/2011. FINDINGS: Brain: There is no evidence of acute infarct, intracranial hemorrhage, mass, midline shift, or extra-axial fluid collection. The ventricles and sulci are normal. Small foci of T2 hyperintensity scattered throughout the subcortical and deep cerebral white matter bilaterally have slightly progressed from 2013 and are nonspecific but compatible with mild chronic small vessel ischemic disease. Vascular: Major intracranial vascular flow voids are preserved. Skull and upper cervical spine: Unremarkable bone marrow signal. Sinuses/Orbits: Unremarkable orbits. Paranasal sinuses and mastoid air cells are clear. Other: None. IMPRESSION: 1. No acute intracranial abnormality. 2. Mild chronic small vessel ischemic disease, slightly progressed from 2013. Electronically Signed   By: Logan Bores M.D.   On: 02/28/2017 12:05    Procedures Procedures (including critical care time)  Medications Ordered in ED Medications   stroke: mapping our early stages of recovery book (not administered)  0.9 %  sodium chloride infusion (not administered)  acetaminophen (TYLENOL) tablet 650 mg (not administered)    Or  acetaminophen (TYLENOL) solution 650 mg (not administered)    Or  acetaminophen (TYLENOL) suppository 650 mg (not administered)  senna-docusate (Senokot-S) tablet 1 tablet (not administered)  ALPRAZolam (XANAX) tablet 0.5 mg (not administered)  brimonidine (ALPHAGAN) 0.15 % ophthalmic solution 1 drop (not administered)  dorzolamide (TRUSOPT) 2 % ophthalmic solution 1  drop (not administered)  furosemide (LASIX) tablet 20 mg (not administered)  potassium chloride SA (K-DUR,KLOR-CON) CR tablet 20 mEq (not administered)  prochlorperazine (COMPAZINE) injection 10 mg (10 mg Intravenous Given 02/28/17 1303)  diphenhydrAMINE (BENADRYL) injection 25 mg (25 mg Intravenous Given 02/28/17 1305)  iopamidol (ISOVUE-370) 76 % injection 100 mL (100 mLs Intravenous Contrast Given 02/28/17 1505)     Initial Impression / Assessment and Plan / ED Course  I have reviewed the triage vital signs and the nursing notes.  Pertinent labs & imaging results that were available during my care of the patient were reviewed by me and considered in my medical decision making (see chart for details).     76 year old female with a history of coronary artery disease, hypertension, hyperlipidemia, prior TIA presents with concern for episode yesterday of difficulty finding words, right hand numbness, followed by severe headache. CT head without acute abnormalities. MR negative.  Discussed possibility for LP given sudden onset severe headache, however in setting of negative MRI, similar symptoms in 2013 have low suspicion for [facility].  Consulted neurology with concern for TIA.  Patient initially except for transfer to [facility] emergency department, however neurology and decided to evaluate patient at [facility]. She was invited at [facility], CT angio head and neck showed no acute findings or blockage. Hospitalist consulted for admission for TIA.  Final Clinical Impressions(s) / ED Diagnoses   Final diagnoses:  Transient cerebral ischemia, unspecified type  Nonintractable headache, unspecified chronicity pattern, unspecified headache type    New Prescriptions Current Discharge Medication List       Gareth Morgan, MD 02/28/17 1916

## 2017-02-28 NOTE — ED Notes (Signed)
Pt currently at CT scan and will be taken to MRI right after according to the MRI tech.

## 2017-02-28 NOTE — Progress Notes (Signed)
[name] - 76 y.o. female MRN [id number]  Date of birth: February 10, 1941  SUBJECTIVE:  Including CC & ROS.  Chief Complaint  Patient presents with  . Headache    headache since yesterday afternoon, constant, OTC ibuprofen used with no relief, described as the worst headache she has ever had     [name] is a 76 year old female is presenting with acute symptoms of headache. She reports this is the worst headache she has ever had. She reports the pain is generalized. She denies any trauma to her head. She is not currently taking any anticoagulants. She is not having any excessive blood pressure problems. She denies any recent illness or sick contacts. She denies any travel. She denies any allergies. She has not had any sinus congestion. She feels like the pain is above her neck with no radiation. She does have a history of glaucoma and was seen by her eye doctor last month. She reports that her pressures were better. She denies any blurry vision or double vision. She has taken ibuprofen for the headache but it has not helped. She does not take Tylenol at all.   She hasn't taken her depression/anxiety medications in over two months. She denies that being a trigger or a problem.   Review of her chart from 07/13/11 shows a CT head with no evidence of acute intracranial abnormality. This was ordered due to blurry vision and sudden headache.  She was also seen on 03/04/16 for headache which was thought to be secondary to an acute emotional stressor.   Review of Systems  Constitutional: Negative for fever.  HENT: Negative for sinus pressure.   Eyes: Negative for visual disturbance.  Respiratory: Negative for cough.   Cardiovascular: Negative for chest pain.  Musculoskeletal: Negative for gait problem.  Skin: Negative for color change.  Neurological: Positive for speech difficulty and headaches.  Hematological: Negative for adenopathy.  Psychiatric/Behavioral: Negative for confusion.  otherwise  negative   HISTORY: Past Medical, Surgical, Social, and Family History Reviewed & Updated per EMR.   Pertinent Historical Findings include:  Past Medical History:  Diagnosis Date  . Anxiety state, unspecified   . CAD (coronary artery disease)   . Disorder of bone and cartilage, unspecified   . Diverticulosis of colon (without mention of hemorrhage)   . Dizziness and giddiness   . Fatty liver   . Fibromyalgia   . Obesity, unspecified   . Other and unspecified hyperlipidemia   . Other chronic nonalcoholic liver disease   . Skin cancer   . TIA (transient ischemic attack)   . Unspecified essential hypertension     Past Surgical History:  Procedure Laterality Date  . BACK SURGERY    . CHOLECYSTECTOMY    . MOHS SURGERY    . TONSILLECTOMY AND ADENOIDECTOMY    . TOTAL ABDOMINAL HYSTERECTOMY W/ BILATERAL SALPINGOOPHORECTOMY      Allergies  Allergen Reactions  . Diltiazem Hcl Other (See Comments)    REACTION: pt states it made her dizzy...  . Statins     Muscle aches  . Zetia [Ezetimibe]     "it made me feel bad"  . Codeine Nausea Only  . Sulfonamide Derivatives Hives, Itching and Rash  . Tape Itching  . Tramadol Itching    Family History  Problem Relation Age of Onset  . Stomach cancer Mother   . Cancer Brother   . Breast cancer Sister   . Breast cancer Sister      Social  History   Social History  . Marital status: Married    Spouse name: Thompson Grayer. Enyeart Sr.  . Number of children: 4  . Years of education: N/A   Occupational History  .  Retired   Social History Main Topics  . Smoking status: Never Smoker  . Smokeless tobacco: Never Used  . Alcohol use No  . Drug use: No  . Sexual activity: Not Currently   Other Topics Concern  . Not on file   Social History Narrative  . No narrative on file     PHYSICAL EXAM:  VS: BP 136/80 (BP Location: Left Arm, Patient Position: Sitting, Cuff Size: Large)   Pulse 61   Temp 98.3 F (36.8 C) (Oral)   Resp 16    Ht 5\' 3"  (1.6 m)   Wt 176 lb (79.8 kg)   SpO2 96%   BMI 31.18 kg/m  Physical Exam Gen: NAD, alert, cooperative with exam,  ENT: normal lips, normal nasal mucosa, right tympanic membrane was normal, left tympanic membrane with tube in the membrane. Normal nasal turbinates, oropharynx clear, no cervical adenopathy Eye: normal EOM, normal conjunctiva and lids, PERRL CV:  no edema, +2 pedal pulses, S1-S2, regular rate and rhythm   Resp: no accessory muscle use, non-labored, clear to auscultation bilaterally Skin: no rashes, no areas of induration  Neuro: normal tone, normal sensation to touch, cranial nerves II through XII intact, normal finger to nose testing, normal Babinski sign, normal heel to shin testing  Psych:  normal insight, alert and oriented MSK: Normal gait, normal strength, normal neck range of motion    After the Toradol 30 mg was provided she reported having complaints of aphasia yesterday afternoon, around 5pm, then the headache followed this. She reports she was trying to tell her grandson something and was unable to get it out.   ASSESSMENT & PLAN:   Headache The headache is acute in nature. It is worrisome that she had some symptoms of aphasia that preceded this headache. She has had a history of headaches in the past but she reports this is the worse. She denies any recent illnesses. Exam was normal. - toradol was provided  - advised to go to the ER after she reported symptoms of aphasia in order to receive an MRI/MRA.   Depression with somatization She has not taken her depression medications in over 2 months. She reports this is not a problem. She denies depression being a source of her symptoms today. - Can follow-up for a PHQ-9 and a GAD- 7

## 2017-02-28 NOTE — Assessment & Plan Note (Signed)
She has not taken her depression medications in over 2 months. She reports this is not a problem. She denies depression being a source of her symptoms today. - Can follow-up for a PHQ-9 and a GAD- 7

## 2017-02-28 NOTE — Consult Note (Addendum)
Requesting Physician: Dr. Billy Fischer    Chief Complaint: TIA  History obtained from:  Patient     HPI:                                                                                                                                         [name] is an 76 y.o. female who noted yesterday a sudden onset of inability to express herself at approximately 4:00 PM. Apparently she is on the phone with her grandson and wanted to discuss something but could not get her words out. She states this lasted for approximately 5 minutes and then she noticed that her right hand had a tingling sensation that lasted for approximately 2 minutes. After this she noted a headache that was a 7/10 and was full of cranial but not throbbing or over a ocular region. She denies any type of an aura such as scotoma, zigzag lines, flashing lights, kaleidoscope images. Currently she still has a 5/10 headache but no other symptoms. MRI was obtained and did not show any acute stroke. Patient denies ever having a migraine in her history.  Patient states that she takes 2 0.5 mg xanax nightly but has not missed any doses  Date last known well: Date: 02/27/2017 Time last known well: Time: 16:00 tPA Given: No: symptoms resolved NIH stroke scale was 0 Modified Rankin: Rankin Score=0    Past Medical History:  Diagnosis Date  . Anxiety state, unspecified   . CAD (coronary artery disease)   . Disorder of bone and cartilage, unspecified   . Diverticulosis of colon (without mention of hemorrhage)   . Dizziness and giddiness   . Fatty liver   . Fibromyalgia   . Obesity, unspecified   . Other and unspecified hyperlipidemia   . Other chronic nonalcoholic liver disease   . Skin cancer   . TIA (transient ischemic attack)   . Unspecified essential hypertension     Past Surgical History:  Procedure Laterality Date  . BACK SURGERY    . CHOLECYSTECTOMY    . MOHS SURGERY    . TONSILLECTOMY AND ADENOIDECTOMY    . TOTAL  ABDOMINAL HYSTERECTOMY W/ BILATERAL SALPINGOOPHORECTOMY      Family History  Problem Relation Age of Onset  . Stomach cancer Mother   . Cancer Brother   . Breast cancer Sister   . Breast cancer Sister    Social History:  reports that she has never smoked. She has never used smokeless tobacco. She reports that she does not drink alcohol or use drugs.  Allergies:  Allergies  Allergen Reactions  . Diltiazem Hcl Other (See Comments)    REACTION: pt states it made her dizzy...  . Statins     Muscle aches  . Zetia [Ezetimibe]     "it made me feel bad"  . Codeine Nausea Only  . Sulfonamide Derivatives Hives, Itching and Rash  .  Tape Itching  . Tramadol Itching    Medications:                                                                                                                           No current facility-administered medications for this encounter.    Current Outpatient Prescriptions  Medication Sig Dispense Refill  . ALPRAZolam (XANAX) 0.5 MG tablet Take 1 tablet (0.5 mg total) by mouth 3 (three) times daily as needed. 90 tablet 2  . brimonidine (ALPHAGAN) 0.15 % ophthalmic solution Place 1 drop into both eyes 3 (three) times daily.  3  . Cyanocobalamin (B-12 COMPLIANCE INJECTION) 1000 MCG/ML KIT Inject 1,000 mcg as directed every 30 (thirty) days.    . dorzolamide (TRUSOPT) 2 % ophthalmic solution Place 1 drop into both eyes 3 (three) times daily.  1  . furosemide (LASIX) 20 MG tablet TAKE ONE TABLET BY MOUTH ONCE DAILY 30 tablet 3  . ibuprofen (ADVIL,MOTRIN) 200 MG tablet Take 400 mg by mouth every 6 (six) hours as needed for headache.    . potassium chloride SA (K-DUR,KLOR-CON) 20 MEQ tablet Take 1 tablet (20 mEq total) by mouth 2 (two) times daily. 180 tablet 1  . Cyanocobalamin 500 MCG/0.1ML SOLN Place 0.1 mLs (500 mcg total) into the nose once a week. (Patient not taking: Reported on 02/28/2017) 2.3 mL 11  . diclofenac sodium (VOLTAREN) 1 % GEL Apply 2 g topically 4  (four) times daily. (Patient not taking: Reported on 02/28/2017) 100 g 1  . DULoxetine (CYMBALTA) 30 MG capsule Take 1 capsule (30 mg total) by mouth daily. (Patient not taking: Reported on 02/28/2017) 30 capsule 1     ROS:                                                                                                                                       History obtained from the patient  General ROS: negative for - chills, fatigue, fever, night sweats, weight gain or weight loss Psychological ROS: negative for - behavioral disorder, hallucinations, memory difficulties, mood swings or suicidal ideation Ophthalmic ROS: negative for - blurry vision, double vision, eye pain or loss of vision ENT ROS: negative for - epistaxis, nasal discharge, oral lesions, sore throat, tinnitus or vertigo Allergy and Immunology ROS: negative for - hives or itchy/watery eyes Hematological and Lymphatic ROS: negative for - bleeding problems,  bruising or swollen lymph nodes Endocrine ROS: negative for - galactorrhea, hair pattern changes, polydipsia/polyuria or temperature intolerance Respiratory ROS: negative for - cough, hemoptysis, shortness of breath or wheezing Cardiovascular ROS: negative for - chest pain, dyspnea on exertion, edema or irregular heartbeat Gastrointestinal ROS: negative for - abdominal pain, diarrhea, hematemesis, nausea/vomiting or stool incontinence Genito-Urinary ROS: negative for - dysuria, hematuria, incontinence or urinary frequency/urgency Musculoskeletal ROS: negative for - joint swelling or muscular weakness Neurological ROS: as noted in HPI Dermatological ROS: negative for rash and skin lesion changes  Neurologic Examination:                                                                                                      Blood pressure (!) 153/60, pulse (!) 53, temperature 97.6 F (36.4 C), resp. rate 17, height 5' 3"  (1.6 m), weight 79.8 kg (176 lb), SpO2 100 %.  HEENT-   Normocephalic, no lesions, without obvious abnormality.  Normal external eye and conjunctiva.  Normal TM's bilaterally.  Normal auditory canals and external ears. Normal external nose, mucus membranes and septum.  Normal pharynx. Cardiovascular- S1, S2 normal, pulses palpable throughout   Lungs- chest clear, no wheezing, rales, normal symmetric air entry Abdomen- normal findings: bowel sounds normal Extremities- no edema Lymph-no adenopathy palpable Musculoskeletal-no joint tenderness, deformity or swelling Skin-warm and dry, no hyperpigmentation, vitiligo, or suspicious lesions  Neurological Examination Mental Status: Alert, oriented, thought content appropriate.  Speech fluent without evidence of aphasia.  Able to follow 3 step commands without difficulty. Cranial Nerves: II:  Visual fields grossly normal,  III,IV, VI: ptosis not present, extra-ocular motions intact bilaterally, pupils equal, round, reactive to light and accommodation V,VII: smile symmetric, facial light touch sensation normal bilaterally VIII: hearing normal bilaterally IX,X: uvula rises symmetrically XI: bilateral shoulder shrug XII: midline tongue extension Motor: Right : Upper extremity   5/5    Left:     Upper extremity   5/5  Lower extremity   5/5     Lower extremity   5/5 Tone and bulk:normal tone throughout; no atrophy noted Sensory: Pinprick and light touch intact throughout, bilaterally Deep Tendon Reflexes: 2+ and symmetric throughout Plantars: Right: downgoing   Left: downgoing Cerebellar: normal finger-to-nose, normal rapid alternating movements and normal heel-to-shin test Gait: not tested   Lab Results: Basic Metabolic Panel:  Recent Labs Lab 02/28/17 1051 02/28/17 1103  NA 138 138  K 3.9 4.2  CL 107 109  CO2 24  --   GLUCOSE 109* 103*  BUN 18 19  CREATININE 0.78 0.70  CALCIUM 9.0  --     Liver Function Tests:  Recent Labs Lab 02/28/17 1051  AST 30  ALT 23  ALKPHOS 159*   BILITOT 1.0  PROT 6.6  ALBUMIN 4.1   No results for input(s): LIPASE, AMYLASE in the last 168 hours. No results for input(s): AMMONIA in the last 168 hours.  CBC:  Recent Labs Lab 02/28/17 1051 02/28/17 1103  WBC 5.4  --   NEUTROABS 4.5  --   HGB 14.2 13.6  HCT 41.2 40.0  MCV 86.4  --   PLT 115*  --     Cardiac Enzymes: No results for input(s): CKTOTAL, CKMB, CKMBINDEX, TROPONINI in the last 168 hours.  Lipid Panel: No results for input(s): CHOL, TRIG, HDL, CHOLHDL, VLDL, LDLCALC in the last 168 hours.  CBG: No results for input(s): GLUCAP in the last 168 hours.  Microbiology: Results for orders placed or performed in visit on 02/21/14  Giardia/cryptosporidium (EIA)     Status: None   Collection Time: 02/21/14 10:20 AM  Result Value Ref Range Status   Giardia Screen (EIA) NEGATIVE  Final   Cryptosporidium Screen (EIA) NEGATIVE  Final    Coagulation Studies:  Recent Labs  02/28/17 1049  LABPROT 12.9  INR 0.98    Imaging: Ct Head Wo Contrast  Result Date: 02/28/2017 CLINICAL DATA:  Headache and aphasia. EXAM: CT HEAD WITHOUT CONTRAST TECHNIQUE: Contiguous axial images were obtained from the base of the skull through the vertex without intravenous contrast. COMPARISON:  MRI orbits dated December 05, 2013. MRI brain dated July 14, 2011. FINDINGS: Brain: No evidence of acute infarction, hemorrhage, hydrocephalus, extra-axial collection or mass lesion/mass effect. Mild periventricular white matter and corona radiata hypodensities favor chronic ischemic microvascular white matter disease. Vascular: No hyperdense vessel. Atherosclerotic vascular calcification of the carotid siphons. Skull: Normal. Negative for fracture or focal lesion. Sinuses/Orbits: The bilateral paranasal sinuses and mastoid air cells are clear. The orbits are unremarkable. Other: None. IMPRESSION: 1.  No acute intracranial abnormality. Electronically Signed   By: Titus Dubin M.D.   On: 02/28/2017  11:17   Mr Brain Wo Contrast  Result Date: 02/28/2017 CLINICAL DATA:  Headache and aphasia. EXAM: MRI HEAD WITHOUT CONTRAST TECHNIQUE: Multiplanar, multiecho pulse sequences of the brain and surrounding structures were obtained without intravenous contrast. COMPARISON:  Head CT 02/28/2017. MRI orbits 12/05/2013. MRI brain 07/14/2011. FINDINGS: Brain: There is no evidence of acute infarct, intracranial hemorrhage, mass, midline shift, or extra-axial fluid collection. The ventricles and sulci are normal. Small foci of T2 hyperintensity scattered throughout the subcortical and deep cerebral white matter bilaterally have slightly progressed from 2013 and are nonspecific but compatible with mild chronic small vessel ischemic disease. Vascular: Major intracranial vascular flow voids are preserved. Skull and upper cervical spine: Unremarkable bone marrow signal. Sinuses/Orbits: Unremarkable orbits. Paranasal sinuses and mastoid air cells are clear. Other: None. IMPRESSION: 1. No acute intracranial abnormality. 2. Mild chronic small vessel ischemic disease, slightly progressed from 2013. Electronically Signed   By: Logan Bores M.D.   On: 02/28/2017 12:05     Assessment and plan discussed with with attending physician and they are in agreement.    Etta Quill PA-C [facility] [phone]  02/28/2017, 2:28 PM   Assessment: 76 y.o. female presenting to the emergency department after having transient expressive aphasia and right hand tingling that lasted no more than 5 minutes. Currently she is complaining of a headache which is a 5/10 headache and holo-cranial and not throbbing. At this time most likely diagnosis is TIA. Complicated migraine headache is unlikely given her age and no history of migraines.   Patient will be admitted by the hospitalists and then transferred to [facility] for further stroke evaluation. While she is at [place] long and waiting she will obtain a CTA of head and  neck  Stroke Risk Factors - none  Recommend 1. HgbA1c, fasting lipid panel 2. MRI of the brain without contrast 3. PT consult, OT consult, Speech consult 4. Echocardiogram 5. 40 mg of Atorvastatin  would generally be recommended following TIA. However, given her history of fatty liver and elevated liver enzymes, benefits may be outweighed by risks.  6. Prophylactic therapy-Antiplatelet med: Aspirin 81 mg daily 7. Risk factor modification 8. Telemetry monitoring 9. Frequent neuro checks 10 NPO until passes stroke swallow screen 11. CTA of head and neck 12 please page stroke NP  Or  PA  Or MD from 8am -4 pm  as this patient from this time will be  followed by the stroke.   You can look them up on [email or web address]  Password [email or web address]   Electronically signed: Dr. Kerney Elbe

## 2017-02-28 NOTE — ED Notes (Signed)
Call report to [place], [place] at [phone] at [phone]

## 2017-02-28 NOTE — Assessment & Plan Note (Signed)
The headache is acute in nature. It is worrisome that she had some symptoms of aphasia that preceded this headache. She has had a history of headaches in the past but she reports this is the worse. She denies any recent illnesses. Exam was normal. - toradol was provided  - advised to go to the ER after she reported symptoms of aphasia in order to receive an MRI/MRA.

## 2017-02-28 NOTE — Progress Notes (Signed)
Verbal order received for Q4 neuro checks from Dr. Myna Hidalgo.

## 2017-02-28 NOTE — Progress Notes (Addendum)
ICU RN asked to perform NIH stroke scale for pt, by primary RN. ICU RN performed NIH assessment with primary RN in the room.

## 2017-03-01 ENCOUNTER — Observation Stay (HOSPITAL_BASED_OUTPATIENT_CLINIC_OR_DEPARTMENT_OTHER): Payer: Medicare Other

## 2017-03-01 DIAGNOSIS — I351 Nonrheumatic aortic (valve) insufficiency: Secondary | ICD-10-CM

## 2017-03-01 DIAGNOSIS — G459 Transient cerebral ischemic attack, unspecified: Secondary | ICD-10-CM | POA: Diagnosis not present

## 2017-03-01 LAB — LIPID PANEL
CHOL/HDL RATIO: 7.7 ratio
Cholesterol: 215 mg/dL — ABNORMAL HIGH (ref 0–200)
HDL: 28 mg/dL — AB (ref >40)
LDL CALC: 155 mg/dL — AB (ref 0–99)
Triglycerides: 160 mg/dL — ABNORMAL HIGH (ref <150)
VLDL: 32 mg/dL (ref 0–40)

## 2017-03-01 LAB — HEMOGLOBIN A1C
Hgb A1c MFr Bld: 4.9 % (ref 4.8–5.6)
Mean Plasma Glucose: 93.93 mg/dL

## 2017-03-01 LAB — ECHOCARDIOGRAM COMPLETE
Height: 63 in
WEIGHTICAEL: 2871.27 [oz_av]

## 2017-03-01 MED ORDER — PRAVASTATIN SODIUM 20 MG PO TABS
20.0000 mg | ORAL_TABLET | Freq: Every day | ORAL | Status: DC
  Administered 2017-03-01: 20 mg via ORAL
  Filled 2017-03-01: qty 1

## 2017-03-01 MED ORDER — PRAVASTATIN SODIUM 20 MG PO TABS: 20.0000 mg | ORAL_TABLET | Freq: Every day | ORAL | 0 refills | Status: DC

## 2017-03-01 MED ORDER — ASPIRIN 81 MG PO TBEC: 81.0000 mg | DELAYED_RELEASE_TABLET | Freq: Every day | ORAL | 0 refills | Status: DC

## 2017-03-01 NOTE — Progress Notes (Signed)
OT Cancellation Note  Patient Details Name: [name] MRN: [id number] DOB: Oct 21, 1940   Cancelled Treatment:    Reason Eval/Treat Not Completed: Other (comment)  Noted pt at baseline  Per PT eval . Will sign off   [place], Thereasa Parkin 03/01/2017, 12:56 PM

## 2017-03-01 NOTE — Care Management Obs Status (Signed)
[place] NOTIFICATION   Patient Details  Name: KIVA NORLAND MRN: [id number] Date of Birth: 06-26-1940   Medicare Observation Status Notification Given:  Yes    MahabirJuliann Pulse, RN 03/01/2017, 1:19 PM

## 2017-03-01 NOTE — Evaluation (Signed)
Physical Therapy One Time Evaluation Patient Details Name: [name] MRN: [id number] DOB: [birth year] Today's Date: 03/01/2017   History of Present Illness  76 y.o. female with medical history significant of CAD,hyperlipidemia and anxiety admitted with headache, suspected TIA  Clinical Impression  Patient evaluated by Physical Therapy with no further acute PT needs identified. All education has been completed and the patient has no further questions.  Pt denies any current symptoms and overall mobility appears at baseline. See below for any follow-up Physical Therapy or equipment needs. PT is signing off. Thank you for this referral.     Follow Up Recommendations No PT follow up    Equipment Recommendations  None recommended by PT    Recommendations for Other Services       Precautions / Restrictions Precautions Precautions: None      Mobility  Bed Mobility Overal bed mobility: Modified Independent                Transfers Overall transfer level: Needs assistance Equipment used: None Transfers: Sit to/from Stand Sit to Stand: Supervision            Ambulation/Gait Ambulation/Gait assistance: Supervision;Min guard Ambulation Distance (Feet): 400 Feet Assistive device: None Gait Pattern/deviations: WFL(Within Functional Limits)     General Gait Details: pt without any LOB or unsteady gait, no symptoms reported  Stairs            Wheelchair Mobility    Modified Rankin (Stroke Patients Only)       Balance Overall balance assessment: No apparent balance deficits (not formally assessed) (denies any falls)                                           Pertinent Vitals/Pain Pain Assessment: No/denies pain    Home Living Family/patient expects to be discharged to:: Private residence Living Arrangements: Spouse/significant other   Type of Home: House       Home Layout: One level Home Equipment: Environmental consultant - 2 wheels;Cane -  single point      Prior Function Level of Independence: Independent               Hand Dominance        Extremity/Trunk Assessment        Lower Extremity Assessment Lower Extremity Assessment: Overall WFL for tasks assessed    Cervical / Trunk Assessment Cervical / Trunk Assessment: Normal  Communication   Communication: No difficulties  Cognition Arousal/Alertness: Awake/alert Behavior During Therapy: WFL for tasks assessed/performed Overall Cognitive Status: Within Functional Limits for tasks assessed                                        General Comments      Exercises     Assessment/Plan    PT Assessment Patent does not need any further PT services  PT Problem List         PT Treatment Interventions      PT Goals (Current goals can be found in the Care Plan section)  Acute Rehab PT Goals PT Goal Formulation: All assessment and education complete, DC therapy    Frequency     Barriers to discharge        Co-evaluation  AM-PAC PT "6 Clicks" Daily Activity  Outcome Measure Difficulty turning over in bed (including adjusting bedclothes, sheets and blankets)?: None Difficulty moving from lying on back to sitting on the side of the bed? : None Difficulty sitting down on and standing up from a chair with arms ([name].g., wheelchair, bedside commode, etc,.)?: None Help needed moving to and from a bed to chair (including a wheelchair)?: None Help needed walking in hospital room?: A Little Help needed climbing 3-5 steps with a railing? : A Little 6 Click Score: 22    End of Session Equipment Utilized During Treatment: Gait belt Activity Tolerance: Patient tolerated treatment well Patient left: with call bell/phone within reach;in chair Nurse Communication: Mobility status PT Visit Diagnosis: Difficulty in walking, not elsewhere classified (R26.2)    Time: 4854-6270 PT Time Calculation (min) (ACUTE ONLY): 10  min   Charges:   PT Evaluation $PT Eval Low Complexity: 1 Low     PT G Codes:   PT G-Codes **NOT FOR INPATIENT CLASS** Functional Assessment Tool Used: AM-PAC 6 Clicks Basic Mobility;Clinical judgement Functional Limitation: Mobility: Walking and moving around Mobility: Walking and Moving Around Current Status (J5009): At least 1 percent but less than 20 percent impaired, limited or restricted Mobility: Walking and Moving Around Goal Status [phone]): 0 percent impaired, limited or restricted Mobility: Walking and Moving Around Discharge Status [phone]): 0 percent impaired, limited or restricted    [name], PT, DPT 03/01/2017 Pager: [phone]  [name] 03/01/2017, 12:20 PM

## 2017-03-01 NOTE — Progress Notes (Signed)
  Echocardiogram 2D Echocardiogram has been performed.  [name] 03/01/2017, 12:16 PM

## 2017-03-01 NOTE — Discharge Summary (Signed)
Physician Discharge Summary  [name] WGN:[id number] DOB: 01/04/1941 DOA: 02/28/2017  PCP: Janith Lima, MD  Admit date: 02/28/2017 Discharge date: 03/01/2017  Admitted From: Home  Disposition:  Home   Recommendations for Outpatient Follow-up:  1. Follow up with PCP in 1-2 weeks 2. Please obtain BMP/CBC in one week 3. Needs further risk factors modification for TIA>  4. Follow up with cardiologist for evaluation of loop recorder or Holter.     Discharge Condition: Stable.  CODE STATUS: Full code.  Diet recommendation: Heart Healthy  Brief/Interim Summary: [name] is a 76 y.o. female with medical history significant of CAD,hyperlipidemia and anxiety who was in her usual state of health and then yesterday afternoon, had an episode where she developed splitting 10/10 headache that was generalized as well as trouble with word finding and numbness and tingling of the fingers in her right hand.  Headache was nonfocal. After less than a minute, patient found herself able to talk again without any issues andher numbness/tingling sensation went away as well. Her headache persisted although decreased somewhat in nature. She initially resisted coming into the hospital, but was convinced by her family to come into the emergency room today. ED Course: in the emergency room, CT scan of the head was unremarkable. Other initial lab work was unremarkable as well. It was suspected the patient had a TIA. She was seen by neurology and an MRI was done which noted no evidence of acute infarct. Neurology recommended CT angiogram of the head and neck which was also unremarkable for significant stenosis on either side. Hospitalist will call for further evaluation and admission for further stroke workup.  1-TIA; MRI negative for stroke. CTA negative for vessel obstruction.  LDL; 160. patient agree to try pravachol.  Patient will be discharge on aspirin daily.  ECHO negative for thromboembolism.    2-HTN; resume lasix.  3-CAD; on aspirin.    Discharge Diagnoses:  Principal Problem:   TIA (transient ischemic attack) Active Problems:   Hyperlipidemia with target LDL less than 100   OBESITY   HTN (hypertension)   CAD (coronary artery disease)   B12 deficiency anemia   GAD (generalized anxiety disorder)   Dysarthria    Discharge Instructions  Discharge Instructions    Diet - low sodium heart healthy    Complete by:  As directed    Increase activity slowly    Complete by:  As directed      Allergies as of 03/01/2017      Reactions   Diltiazem Hcl Other (See Comments)   REACTION: pt states it made her dizzy...   Statins    Muscle aches   Zetia [ezetimibe]    "it made me feel bad"   Codeine Nausea Only   Sulfonamide Derivatives Hives, Itching, Rash   Tape Itching   Tramadol Itching      Medication List    STOP taking these medications   ibuprofen 200 MG tablet Commonly known as:  ADVIL,MOTRIN     TAKE these medications   ALPRAZolam 0.5 MG tablet Commonly known as:  XANAX Take 1 tablet (0.5 mg total) by mouth 3 (three) times daily as needed.   aspirin 81 MG EC tablet Take 1 tablet (81 mg total) by mouth daily.   B-12 COMPLIANCE INJECTION 1000 MCG/ML Kit Generic drug:  Cyanocobalamin Inject 1,000 mcg as directed every 30 (thirty) days.   brimonidine 0.15 % ophthalmic solution Commonly known as:  ALPHAGAN Place 1 drop into both  eyes 3 (three) times daily.   dorzolamide 2 % ophthalmic solution Commonly known as:  TRUSOPT Place 1 drop into both eyes 3 (three) times daily.   furosemide 20 MG tablet Commonly known as:  LASIX TAKE ONE TABLET BY MOUTH ONCE DAILY   potassium chloride SA 20 MEQ tablet Commonly known as:  K-DUR,KLOR-CON Take 1 tablet (20 mEq total) by mouth 2 (two) times daily.   pravastatin 20 MG tablet Commonly known as:  PRAVACHOL Take 1 tablet (20 mg total) by mouth at bedtime.            Discharge Care Instructions         Start     Ordered   03/02/17 0000  aspirin EC 81 MG EC tablet  Daily     03/01/17 1239   03/02/17 0000  pravastatin (PRAVACHOL) 20 MG tablet  Daily at bedtime     03/01/17 1239   03/01/17 0000  Increase activity slowly     03/01/17 1239   03/01/17 0000  Diet - low sodium heart healthy     03/01/17 1239     Follow-up Information    Janith Lima, MD Follow up in 1 week(s).   Specialty:  Internal Medicine Contact information: [address] [place] [phone]          Allergies  Allergen Reactions  . Diltiazem Hcl Other (See Comments)    REACTION: pt states it made her dizzy...  . Statins     Muscle aches  . Zetia [Ezetimibe]     "it made me feel bad"  . Codeine Nausea Only  . Sulfonamide Derivatives Hives, Itching and Rash  . Tape Itching  . Tramadol Itching    Consultations:  Neurology      Procedures/Studies: Ct Angio Head W Or Wo Contrast  Result Date: 02/28/2017 CLINICAL DATA:  Headache and aphasia. EXAM: CT ANGIOGRAPHY HEAD AND NECK TECHNIQUE: Multidetector CT imaging of the head and neck was performed using the standard protocol during bolus administration of intravenous contrast. Multiplanar CT image reconstructions and MIPs were obtained to evaluate the vascular anatomy. Carotid stenosis measurements (when applicable) are obtained utilizing NASCET criteria, using the distal internal carotid diameter as the denominator. CONTRAST:  100 mL Isovue 370 COMPARISON:  Head CT and MRI earlier today.  Head MRA 07/14/2011. FINDINGS: CTA NECK FINDINGS Aortic arch: Standard 3 vessel aortic arch with mild atherosclerotic plaque. Patent brachiocephalic and subclavian arteries. Eccentric calcified plaque in the proximal left subclavian artery resulting in less than 50% narrowing. Right carotid system: Patent without evidence of dissection or stenosis. Mild calcified plaque at the carotid bifurcation. Left carotid system: Patent without  evidence of dissection, stenosis, or significant atherosclerosis. Vertebral arteries: Patent without definite evidence of stenosis or dissection. The proximal left V1 segment is partially obscured by streak artifact from adjacent venous contrast. Skeleton: Mild multilevel cervical disc and facet degeneration. Other neck: No mass or lymph node enlargement. Upper chest: Motion artifact in the lung apices. Punctate calcified granuloma in the right lung apex. Review of the MIP images confirms the above findings CTA HEAD FINDINGS Anterior circulation: The internal carotid arteries are patent from skullbase to carotid termini. There is mild-to-moderate calcified atherosclerosis involving the carotid siphons without significant stenosis. The ACAs and MCAs are patent without evidence of proximal branch occlusion or significant proximal stenosis. The right ACA is dominant. No aneurysm. Posterior circulation: The intracranial vertebral arteries are widely patent to the basilar. Patent PICA,  AICA, and SCA origins are visualized bilaterally. The basilar artery is somewhat small in caliber diffusely on a developmental basis without focal stenosis. There are fetal type origins of the PCAs with extremely hypoplastic P1 segments bilaterally. No significant PCA stenosis is seen. No aneurysm. Venous sinuses: Patent. Anatomic variants: Fetal origins of the PCAs.  Hypoplastic left ACA. Delayed phase: No abnormal enhancement. Review of the MIP images confirms the above findings IMPRESSION: 1. Mild intracranial and cervical carotid artery atherosclerosis without large vessel occlusion or significant stenosis. 2.  Aortic Atherosclerosis (ICD10-I70.0). Electronically Signed   By: Logan Bores M.D.   On: 02/28/2017 16:08   Ct Head Wo Contrast  Result Date: 02/28/2017 CLINICAL DATA:  Headache and aphasia. EXAM: CT HEAD WITHOUT CONTRAST TECHNIQUE: Contiguous axial images were obtained from the base of the skull through the vertex without  intravenous contrast. COMPARISON:  MRI orbits dated December 05, 2013. MRI brain dated July 14, 2011. FINDINGS: Brain: No evidence of acute infarction, hemorrhage, hydrocephalus, extra-axial collection or mass lesion/mass effect. Mild periventricular white matter and corona radiata hypodensities favor chronic ischemic microvascular white matter disease. Vascular: No hyperdense vessel. Atherosclerotic vascular calcification of the carotid siphons. Skull: Normal. Negative for fracture or focal lesion. Sinuses/Orbits: The bilateral paranasal sinuses and mastoid air cells are clear. The orbits are unremarkable. Other: None. IMPRESSION: 1.  No acute intracranial abnormality. Electronically Signed   By: Titus Dubin M.D.   On: 02/28/2017 11:17   Ct Angio Neck W And/or Wo Contrast  Result Date: 02/28/2017 CLINICAL DATA:  Headache and aphasia. EXAM: CT ANGIOGRAPHY HEAD AND NECK TECHNIQUE: Multidetector CT imaging of the head and neck was performed using the standard protocol during bolus administration of intravenous contrast. Multiplanar CT image reconstructions and MIPs were obtained to evaluate the vascular anatomy. Carotid stenosis measurements (when applicable) are obtained utilizing NASCET criteria, using the distal internal carotid diameter as the denominator. CONTRAST:  100 mL Isovue 370 COMPARISON:  Head CT and MRI earlier today.  Head MRA 07/14/2011. FINDINGS: CTA NECK FINDINGS Aortic arch: Standard 3 vessel aortic arch with mild atherosclerotic plaque. Patent brachiocephalic and subclavian arteries. Eccentric calcified plaque in the proximal left subclavian artery resulting in less than 50% narrowing. Right carotid system: Patent without evidence of dissection or stenosis. Mild calcified plaque at the carotid bifurcation. Left carotid system: Patent without evidence of dissection, stenosis, or significant atherosclerosis. Vertebral arteries: Patent without definite evidence of stenosis or dissection. The  proximal left V1 segment is partially obscured by streak artifact from adjacent venous contrast. Skeleton: Mild multilevel cervical disc and facet degeneration. Other neck: No mass or lymph node enlargement. Upper chest: Motion artifact in the lung apices. Punctate calcified granuloma in the right lung apex. Review of the MIP images confirms the above findings CTA HEAD FINDINGS Anterior circulation: The internal carotid arteries are patent from skullbase to carotid termini. There is mild-to-moderate calcified atherosclerosis involving the carotid siphons without significant stenosis. The ACAs and MCAs are patent without evidence of proximal branch occlusion or significant proximal stenosis. The right ACA is dominant. No aneurysm. Posterior circulation: The intracranial vertebral arteries are widely patent to the basilar. Patent PICA, AICA, and SCA origins are visualized bilaterally. The basilar artery is somewhat small in caliber diffusely on a developmental basis without focal stenosis. There are fetal type origins of the PCAs with extremely hypoplastic P1 segments bilaterally. No significant PCA stenosis is seen. No aneurysm. Venous sinuses: Patent. Anatomic variants: Fetal origins of the PCAs.  Hypoplastic left ACA.  Delayed phase: No abnormal enhancement. Review of the MIP images confirms the above findings IMPRESSION: 1. Mild intracranial and cervical carotid artery atherosclerosis without large vessel occlusion or significant stenosis. 2.  Aortic Atherosclerosis (ICD10-I70.0). Electronically Signed   By: Logan Bores M.D.   On: 02/28/2017 16:08   Mr Brain Wo Contrast  Result Date: 02/28/2017 CLINICAL DATA:  Headache and aphasia. EXAM: MRI HEAD WITHOUT CONTRAST TECHNIQUE: Multiplanar, multiecho pulse sequences of the brain and surrounding structures were obtained without intravenous contrast. COMPARISON:  Head CT 02/28/2017. MRI orbits 12/05/2013. MRI brain 07/14/2011. FINDINGS: Brain: There is no evidence  of acute infarct, intracranial hemorrhage, mass, midline shift, or extra-axial fluid collection. The ventricles and sulci are normal. Small foci of T2 hyperintensity scattered throughout the subcortical and deep cerebral white matter bilaterally have slightly progressed from 2013 and are nonspecific but compatible with mild chronic small vessel ischemic disease. Vascular: Major intracranial vascular flow voids are preserved. Skull and upper cervical spine: Unremarkable bone marrow signal. Sinuses/Orbits: Unremarkable orbits. Paranasal sinuses and mastoid air cells are clear. Other: None. IMPRESSION: 1. No acute intracranial abnormality. 2. Mild chronic small vessel ischemic disease, slightly progressed from 2013. Electronically Signed   By: Logan Bores M.D.   On: 02/28/2017 12:05     Subjective: She is feeling well, speech is clear.   Discharge Exam: Vitals:   03/01/17 0546 03/01/17 1340  BP: 138/60 (!) 166/72  Pulse: (!) 57 63  Resp: 18 19  Temp: 98 F (36.7 C) 98.4 F (36.9 C)  SpO2: 96% 96%   Vitals:   02/28/17 2213 03/01/17 0207 03/01/17 0546 03/01/17 1340  BP: (!) 142/68 (!) 136/58 138/60 (!) 166/72  Pulse: 62 61 (!) 57 63  Resp: _0 Temp: 98.1 F (36.7 C) 98.2 F (36.8 C) 98 F (36.7 C) 98.4 F (36.9 C)  TempSrc: Oral Oral Oral Oral  SpO2: 96% 97% 96% 96%  Weight:      Height:        General: Pt is alert, awake, not in acute distress Cardiovascular: RRR, S1/S2 +, no rubs, no gallops Respiratory: CTA bilaterally, no wheezing, no rhonchi Abdominal: Soft, NT, ND, bowel sounds + Extremities: no edema, no cyanosis    The results of significant diagnostics from this hospitalization (including imaging, microbiology, ancillary and laboratory) are listed below for reference.     Microbiology: No results found for this or any previous visit (from the past 240 hour(s)).   Labs: BNP (last 3 results) No results for input(s): BNP in the last 8760 hours. Basic  Metabolic Panel:  Recent Labs Lab 02/28/17 1051 02/28/17 1103  NA 138 138  K 3.9 4.2  CL 107 109  CO2 24  --   GLUCOSE 109* 103*  BUN 18 19  CREATININE 0.78 0.70  CALCIUM 9.0  --    Liver Function Tests:  Recent Labs Lab 02/28/17 1051  AST 30  ALT 23  ALKPHOS 159*  BILITOT 1.0  PROT 6.6  ALBUMIN 4.1   No results for input(s): LIPASE, AMYLASE in the last 168 hours. No results for input(s): AMMONIA in the last 168 hours. CBC:  Recent Labs Lab 02/28/17 1051 02/28/17 1103  WBC 5.4  --   NEUTROABS 4.5  --   HGB 14.2 13.6  HCT 41.2 40.0  MCV 86.4  --   PLT 115*  --    Cardiac Enzymes: No results for input(s): CKTOTAL, CKMB, CKMBINDEX, TROPONINI in the last 168 hours. BNP: Invalid  input(s): POCBNP CBG: No results for input(s): GLUCAP in the last 168 hours. D-Dimer No results for input(s): DDIMER in the last 72 hours. Hgb A1c  Recent Labs  03/01/17 0624  HGBA1C 4.9   Lipid Profile  Recent Labs  03/01/17 0624  CHOL 215*  HDL 28*  LDLCALC 155*  TRIG 160*  CHOLHDL 7.7   Thyroid function studies No results for input(s): TSH, T4TOTAL, T3FREE, THYROIDAB in the last 72 hours.  Invalid input(s): FREET3 Anemia work up No results for input(s): VITAMINB12, FOLATE, FERRITIN, TIBC, IRON, RETICCTPCT in the last 72 hours. Urinalysis    Component Value Date/Time   COLORURINE YELLOW 11/25/2015 1030   APPEARANCEUR CLEAR 11/25/2015 1030   LABSPEC 1.010 11/25/2015 1030   PHURINE 5.5 11/25/2015 1030   GLUCOSEU NEGATIVE 11/25/2015 1030   HGBUR NEGATIVE 11/25/2015 1030   BILIRUBINUR NEGATIVE 11/25/2015 1030   KETONESUR NEGATIVE 11/25/2015 1030   PROTEINUR NEGATIVE 07/26/2011 0112   UROBILINOGEN 0.2 11/25/2015 1030   NITRITE NEGATIVE 11/25/2015 1030   LEUKOCYTESUR NEGATIVE 11/25/2015 1030   Sepsis Labs Invalid input(s): PROCALCITONIN,  WBC,  LACTICIDVEN Microbiology No results found for this or any previous visit (from the past 240 hour(s)).   Time  coordinating discharge: Over 30 minutes  SIGNED:   Elmarie Shiley, MD  [facility] 03/01/2017, 1:44 PM Pager [phone]  If 7PM-7AM, please contact night-coverage [email or web address] Password [email or web address]

## 2017-03-01 NOTE — Care Management Note (Signed)
Case Management Note  Patient Details  Name: [name] MRN: [id number] Date of Birth: 05-01-1941  Subjective/Objective:  76 y/o f admitted w/TIA. From home.                  Action/Plan:d/c home.   Expected Discharge Date:  03/01/17               Expected Discharge Plan:  Home/Self Care  In-House Referral:     Discharge planning Services  CM Consult  Post Acute Care Choice:    Choice offered to:     DME Arranged:    DME Agency:     HH Arranged:    HH Agency:     Status of Service:  Completed, signed off  If discussed at [employer] of Stay Meetings, dates discussed:    Additional Comments:  Dessa Phi, RN 03/01/2017, 1:19 PM

## 2017-03-02 ENCOUNTER — Telehealth: Payer: Self-pay | Admitting: *Deleted

## 2017-03-02 NOTE — Telephone Encounter (Signed)
Transition Care Management Follow-up Telephone Call   Date discharged? 02/28/17   How have you been since you were released from the hospital? Pt states she is feeling alright   Do you understand why you were in the hospital? YES   Do you understand the discharge instructions? YES   Where were you discharged to? Home   Items Reviewed:  Medications reviewed: YES  Allergies reviewed: YES  Dietary changes reviewed: YES, Heart healthy  Referrals reviewed: No referral needed   Functional Questionnaire:  Activities of Daily Living (ADLs):   She states she are independent in the following: ambulation, bathing and hygiene, feeding, continence, grooming, toileting and dressing   States she doesn't require assistance    Any transportation issues/concerns?: YES   Any patient concerns? NO   Confirmed importance and date/time of follow-up visits scheduled YES, appt 03/09/17  Provider Appointment booked with Dr. Ronnald Ramp  Confirmed with patient if condition begins to worsen call PCP or go to the ER.  Patient was given the office number and encouraged to call back with question or concerns.  : YES

## 2017-03-09 ENCOUNTER — Encounter: Payer: Self-pay | Admitting: Internal Medicine

## 2017-03-09 ENCOUNTER — Ambulatory Visit (INDEPENDENT_AMBULATORY_CARE_PROVIDER_SITE_OTHER): Payer: Medicare Other | Admitting: Internal Medicine

## 2017-03-09 VITALS — BP 124/80 | HR 68 | Temp 98.3°F | Resp 16 | Ht 63.0 in | Wt 174.0 lb

## 2017-03-09 DIAGNOSIS — D518 Other vitamin B12 deficiency anemias: Secondary | ICD-10-CM

## 2017-03-09 DIAGNOSIS — F450 Somatization disorder: Secondary | ICD-10-CM

## 2017-03-09 DIAGNOSIS — I10 Essential (primary) hypertension: Secondary | ICD-10-CM | POA: Diagnosis not present

## 2017-03-09 DIAGNOSIS — F329 Major depressive disorder, single episode, unspecified: Secondary | ICD-10-CM | POA: Diagnosis not present

## 2017-03-09 DIAGNOSIS — G458 Other transient cerebral ischemic attacks and related syndromes: Secondary | ICD-10-CM

## 2017-03-09 DIAGNOSIS — F32A Depression, unspecified: Secondary | ICD-10-CM

## 2017-03-09 DIAGNOSIS — Z23 Encounter for immunization: Secondary | ICD-10-CM | POA: Diagnosis not present

## 2017-03-09 DIAGNOSIS — Z8673 Personal history of transient ischemic attack (TIA), and cerebral infarction without residual deficits: Secondary | ICD-10-CM

## 2017-03-09 MED ORDER — CYANOCOBALAMIN 1000 MCG/ML IJ SOLN
1000.0000 ug | Freq: Once | INTRAMUSCULAR | Status: AC
  Administered 2017-03-09: 1000 ug via INTRAMUSCULAR

## 2017-03-09 NOTE — Progress Notes (Signed)
Subjective:  Patient ID: [name], female    DOB: 1941-03-14  Age: 76 y.o. MRN: [id number]  CC: Hypertension   HPI [name] presents for f/up - She was recently admitted for headache and difficulty with word finding and was diagnosed with a TIA. Her brain scans were normal. She was discharged and asked to start taking a statin which she has done. She has felt well since then with no residual or recurrent symptoms. She feels well today and offers no complaints.  Outpatient Medications Prior to Visit  Medication Sig Dispense Refill  . ALPRAZolam (XANAX) 0.5 MG tablet Take 1 tablet (0.5 mg total) by mouth 3 (three) times daily as needed. 90 tablet 2  . aspirin EC 81 MG EC tablet Take 1 tablet (81 mg total) by mouth daily. 30 tablet 0  . brimonidine (ALPHAGAN) 0.15 % ophthalmic solution Place 1 drop into both eyes 3 (three) times daily.  3  . dorzolamide (TRUSOPT) 2 % ophthalmic solution Place 1 drop into both eyes 3 (three) times daily.  1  . furosemide (LASIX) 20 MG tablet TAKE ONE TABLET BY MOUTH ONCE DAILY 30 tablet 3  . potassium chloride SA (K-DUR,KLOR-CON) 20 MEQ tablet Take 1 tablet (20 mEq total) by mouth 2 (two) times daily. 180 tablet 1  . pravastatin (PRAVACHOL) 20 MG tablet Take 1 tablet (20 mg total) by mouth at bedtime. 30 tablet 0  . Cyanocobalamin (B-12 COMPLIANCE INJECTION) 1000 MCG/ML KIT Inject 1,000 mcg as directed every 30 (thirty) days.     No facility-administered medications prior to visit.     ROS Review of Systems  Constitutional: Negative.  Negative for appetite change, diaphoresis, fatigue, fever and unexpected weight change.  HENT: Negative.  Negative for trouble swallowing and voice change.   Eyes: Negative for visual disturbance.  Respiratory: Negative for cough, chest tightness, shortness of breath and wheezing.   Cardiovascular: Negative.  Negative for chest pain, palpitations and leg swelling.  Gastrointestinal: Negative for abdominal pain,  constipation, diarrhea, nausea and vomiting.  Endocrine: Negative.   Genitourinary: Negative.  Negative for difficulty urinating.  Musculoskeletal: Negative.  Negative for back pain.  Skin: Negative.   Allergic/Immunologic: Negative.   Neurological: Negative.  Negative for dizziness, tremors, seizures, syncope, facial asymmetry, speech difficulty, weakness, light-headedness, numbness and headaches.  Hematological: Negative for adenopathy. Does not bruise/bleed easily.  Psychiatric/Behavioral: Positive for dysphoric mood. Negative for decreased concentration, self-injury, sleep disturbance and suicidal ideas. The patient is nervous/anxious.     Objective:  BP 124/80 (BP Location: Left Arm, Patient Position: Sitting, Cuff Size: Large)   Pulse 68   Temp 98.3 F (36.8 C) (Oral)   Resp 16   Ht _0  (1.6 m)   Wt 174 lb (78.9 kg)   SpO2 99%   BMI 30.82 kg/m   BP Readings from Last 3 Encounters:  03/09/17 124/80  03/01/17 (!) 166/72  02/28/17 136/80    Wt Readings from Last 3 Encounters:  03/09/17 174 lb (78.9 kg)  02/28/17 179 lb 7.3 oz (81.4 kg)  02/28/17 176 lb (79.8 kg)    Physical Exam  Constitutional: She is oriented to person, place, and time. No distress.  HENT:  Mouth/Throat: Oropharynx is clear and moist. No oropharyngeal exudate.  Eyes: Conjunctivae are normal. Right eye exhibits no discharge. Left eye exhibits no discharge. No scleral icterus.  Neck: Normal range of motion. Neck supple. No JVD present. No thyromegaly present.  Cardiovascular: Normal rate, regular rhythm and intact  distal pulses.  Exam reveals no gallop and no friction rub.   No murmur heard. Pulmonary/Chest: Effort normal and breath sounds normal. No respiratory distress. She has no wheezes. She has no rales. She exhibits no tenderness.  Abdominal: Soft. Bowel sounds are normal. She exhibits no distension and no mass. There is no tenderness. There is no rebound and no guarding.  Musculoskeletal:  Normal range of motion. She exhibits no edema, tenderness or deformity.  Lymphadenopathy:    She has no cervical adenopathy.  Neurological: She is alert and oriented to person, place, and time. She has normal reflexes. She displays normal reflexes. No cranial nerve deficit. She exhibits normal muscle tone. Coordination normal.  Skin: Skin is warm and dry. No rash noted. She is not diaphoretic. No erythema. No pallor.  Psychiatric: She has a normal mood and affect. Her behavior is normal. Judgment and thought content normal.  Vitals reviewed.   Lab Results  Component Value Date   WBC 5.4 02/28/2017   HGB 13.6 02/28/2017   HCT 40.0 02/28/2017   PLT 115 (L) 02/28/2017   GLUCOSE 103 (H) 02/28/2017   CHOL 215 (H) 03/01/2017   TRIG 160 (H) 03/01/2017   HDL 28 (L) 03/01/2017   LDLDIRECT 159.0 12/02/2014   LDLCALC 155 (H) 03/01/2017   ALT 23 02/28/2017   AST 30 02/28/2017   NA 138 02/28/2017   K 4.2 02/28/2017   CL 109 02/28/2017   CREATININE 0.70 02/28/2017   BUN 19 02/28/2017   CO2 24 02/28/2017   TSH 3.65 12/13/2016   INR 0.98 02/28/2017   HGBA1C 4.9 03/01/2017    Ct Angio Head W Or Wo Contrast  Result Date: 02/28/2017 CLINICAL DATA:  Headache and aphasia. EXAM: CT ANGIOGRAPHY HEAD AND NECK TECHNIQUE: Multidetector CT imaging of the head and neck was performed using the standard protocol during bolus administration of intravenous contrast. Multiplanar CT image reconstructions and MIPs were obtained to evaluate the vascular anatomy. Carotid stenosis measurements (when applicable) are obtained utilizing NASCET criteria, using the distal internal carotid diameter as the denominator. CONTRAST:  100 mL Isovue 370 COMPARISON:  Head CT and MRI earlier today.  Head MRA 07/14/2011. FINDINGS: CTA NECK FINDINGS Aortic arch: Standard 3 vessel aortic arch with mild atherosclerotic plaque. Patent brachiocephalic and subclavian arteries. Eccentric calcified plaque in the proximal left subclavian  artery resulting in less than 50% narrowing. Right carotid system: Patent without evidence of dissection or stenosis. Mild calcified plaque at the carotid bifurcation. Left carotid system: Patent without evidence of dissection, stenosis, or significant atherosclerosis. Vertebral arteries: Patent without definite evidence of stenosis or dissection. The proximal left V1 segment is partially obscured by streak artifact from adjacent venous contrast. Skeleton: Mild multilevel cervical disc and facet degeneration. Other neck: No mass or lymph node enlargement. Upper chest: Motion artifact in the lung apices. Punctate calcified granuloma in the right lung apex. Review of the MIP images confirms the above findings CTA HEAD FINDINGS Anterior circulation: The internal carotid arteries are patent from skullbase to carotid termini. There is mild-to-moderate calcified atherosclerosis involving the carotid siphons without significant stenosis. The ACAs and MCAs are patent without evidence of proximal branch occlusion or significant proximal stenosis. The right ACA is dominant. No aneurysm. Posterior circulation: The intracranial vertebral arteries are widely patent to the basilar. Patent PICA, AICA, and SCA origins are visualized bilaterally. The basilar artery is somewhat small in caliber diffusely on a developmental basis without focal stenosis. There are fetal type origins of the PCAs  with extremely hypoplastic P1 segments bilaterally. No significant PCA stenosis is seen. No aneurysm. Venous sinuses: Patent. Anatomic variants: Fetal origins of the PCAs.  Hypoplastic left ACA. Delayed phase: No abnormal enhancement. Review of the MIP images confirms the above findings IMPRESSION: 1. Mild intracranial and cervical carotid artery atherosclerosis without large vessel occlusion or significant stenosis. 2.  Aortic Atherosclerosis (ICD10-I70.0). Electronically Signed   By: Logan Bores M.D.   On: 02/28/2017 16:08   Ct Head Wo  Contrast  Result Date: 02/28/2017 CLINICAL DATA:  Headache and aphasia. EXAM: CT HEAD WITHOUT CONTRAST TECHNIQUE: Contiguous axial images were obtained from the base of the skull through the vertex without intravenous contrast. COMPARISON:  MRI orbits dated December 05, 2013. MRI brain dated July 14, 2011. FINDINGS: Brain: No evidence of acute infarction, hemorrhage, hydrocephalus, extra-axial collection or mass lesion/mass effect. Mild periventricular white matter and corona radiata hypodensities favor chronic ischemic microvascular white matter disease. Vascular: No hyperdense vessel. Atherosclerotic vascular calcification of the carotid siphons. Skull: Normal. Negative for fracture or focal lesion. Sinuses/Orbits: The bilateral paranasal sinuses and mastoid air cells are clear. The orbits are unremarkable. Other: None. IMPRESSION: 1.  No acute intracranial abnormality. Electronically Signed   By: Titus Dubin M.D.   On: 02/28/2017 11:17   Ct Angio Neck W And/or Wo Contrast  Result Date: 02/28/2017 CLINICAL DATA:  Headache and aphasia. EXAM: CT ANGIOGRAPHY HEAD AND NECK TECHNIQUE: Multidetector CT imaging of the head and neck was performed using the standard protocol during bolus administration of intravenous contrast. Multiplanar CT image reconstructions and MIPs were obtained to evaluate the vascular anatomy. Carotid stenosis measurements (when applicable) are obtained utilizing NASCET criteria, using the distal internal carotid diameter as the denominator. CONTRAST:  100 mL Isovue 370 COMPARISON:  Head CT and MRI earlier today.  Head MRA 07/14/2011. FINDINGS: CTA NECK FINDINGS Aortic arch: Standard 3 vessel aortic arch with mild atherosclerotic plaque. Patent brachiocephalic and subclavian arteries. Eccentric calcified plaque in the proximal left subclavian artery resulting in less than 50% narrowing. Right carotid system: Patent without evidence of dissection or stenosis. Mild calcified plaque at the  carotid bifurcation. Left carotid system: Patent without evidence of dissection, stenosis, or significant atherosclerosis. Vertebral arteries: Patent without definite evidence of stenosis or dissection. The proximal left V1 segment is partially obscured by streak artifact from adjacent venous contrast. Skeleton: Mild multilevel cervical disc and facet degeneration. Other neck: No mass or lymph node enlargement. Upper chest: Motion artifact in the lung apices. Punctate calcified granuloma in the right lung apex. Review of the MIP images confirms the above findings CTA HEAD FINDINGS Anterior circulation: The internal carotid arteries are patent from skullbase to carotid termini. There is mild-to-moderate calcified atherosclerosis involving the carotid siphons without significant stenosis. The ACAs and MCAs are patent without evidence of proximal branch occlusion or significant proximal stenosis. The right ACA is dominant. No aneurysm. Posterior circulation: The intracranial vertebral arteries are widely patent to the basilar. Patent PICA, AICA, and SCA origins are visualized bilaterally. The basilar artery is somewhat small in caliber diffusely on a developmental basis without focal stenosis. There are fetal type origins of the PCAs with extremely hypoplastic P1 segments bilaterally. No significant PCA stenosis is seen. No aneurysm. Venous sinuses: Patent. Anatomic variants: Fetal origins of the PCAs.  Hypoplastic left ACA. Delayed phase: No abnormal enhancement. Review of the MIP images confirms the above findings IMPRESSION: 1. Mild intracranial and cervical carotid artery atherosclerosis without large vessel occlusion or significant stenosis. 2.  Aortic Atherosclerosis (ICD10-I70.0). Electronically Signed   By: Logan Bores M.D.   On: 02/28/2017 16:08   Mr Brain Wo Contrast  Result Date: 02/28/2017 CLINICAL DATA:  Headache and aphasia. EXAM: MRI HEAD WITHOUT CONTRAST TECHNIQUE: Multiplanar, multiecho pulse  sequences of the brain and surrounding structures were obtained without intravenous contrast. COMPARISON:  Head CT 02/28/2017. MRI orbits 12/05/2013. MRI brain 07/14/2011. FINDINGS: Brain: There is no evidence of acute infarct, intracranial hemorrhage, mass, midline shift, or extra-axial fluid collection. The ventricles and sulci are normal. Small foci of T2 hyperintensity scattered throughout the subcortical and deep cerebral white matter bilaterally have slightly progressed from 2013 and are nonspecific but compatible with mild chronic small vessel ischemic disease. Vascular: Major intracranial vascular flow voids are preserved. Skull and upper cervical spine: Unremarkable bone marrow signal. Sinuses/Orbits: Unremarkable orbits. Paranasal sinuses and mastoid air cells are clear. Other: None. IMPRESSION: 1. No acute intracranial abnormality. 2. Mild chronic small vessel ischemic disease, slightly progressed from 2013. Electronically Signed   By: Logan Bores M.D.   On: 02/28/2017 12:05    Assessment & Plan:   [name] was seen today for hypertension.  Diagnoses and all orders for this visit:  Essential hypertension- her blood pressure is well-controlled. -     cyanocobalamin ((VITAMIN B-12)) injection 1,000 mcg; Inject 1 mL (1,000 mcg total) into the muscle once.  Other specified transient cerebral ischemias- she has had no residual or recurrent symptoms. Will continue risk factor modification with aspirin, statins, and blood pressure control.  Depression with somatization- she is not willing to take an antidepressant  Need for influenza vaccination -     Flu vaccine HIGH DOSE PF (Fluzone High dose)  Other vitamin B12 deficiency anemia- will continue monthly B12 injections.   I am having [name] maintain her ALPRAZolam, furosemide, potassium chloride SA, brimonidine, dorzolamide, Cyanocobalamin, aspirin, and pravastatin. We administered cyanocobalamin.  Meds ordered this encounter    Medications  . cyanocobalamin ((VITAMIN B-12)) injection 1,000 mcg     Follow-up: Return in about 4 months (around 07/09/2017).  Scarlette Calico, MD

## 2017-03-09 NOTE — Patient Instructions (Signed)

## 2017-03-27 ENCOUNTER — Other Ambulatory Visit: Payer: Self-pay | Admitting: Internal Medicine

## 2017-04-10 NOTE — Progress Notes (Signed)
Pre visit review using our clinic review tool, if applicable. No additional management support is needed unless otherwise documented below in the visit note. 

## 2017-04-10 NOTE — Progress Notes (Addendum)
Subjective:   [name] is a 76 y.o. female who presents for Medicare Annual (Subsequent) preventive examination.  Review of Systems:  No ROS.  Medicare Wellness Visit. Additional risk factors are reflected in the social history.  Cardiac Risk Factors include: advanced age (>64mn, >>85women);dyslipidemia;hypertension Sleep patterns: gets up 1-2 times nightly to void and sleeps 6-7 hours nightly.    Home Safety/Smoke Alarms: Feels safe in home. Smoke alarms in place.  Living environment; residence and Firearm Safety: 1-story house/ trailer, no firearms. Lives with husband, no needs for DME, good support system Seat Belt Safety/Bike Helmet: Wears seat belt.      Objective:     Vitals: BP 136/84   Pulse 62   Resp 20   Ht _0  (1.6 m)   Wt 177 lb (80.3 kg)   SpO2 98%   BMI 31.35 kg/m   Body mass index is 31.35 kg/m.   Tobacco History  Smoking Status  . Never Smoker  Smokeless Tobacco  . Never Used     Counseling given: Not Answered   Past Medical History:  Diagnosis Date  . Anxiety state, unspecified   . CAD (coronary artery disease)   . Disorder of bone and cartilage, unspecified   . Diverticulosis of colon (without mention of hemorrhage)   . Dizziness and giddiness   . Fatty liver   . Fibromyalgia   . Obesity, unspecified   . Other and unspecified hyperlipidemia   . Other chronic nonalcoholic liver disease   . Skin cancer   . TIA (transient ischemic attack)   . Unspecified essential hypertension    Past Surgical History:  Procedure Laterality Date  . BACK SURGERY    . CHOLECYSTECTOMY    . MOHS SURGERY    . TONSILLECTOMY AND ADENOIDECTOMY    . TOTAL ABDOMINAL HYSTERECTOMY W/ BILATERAL SALPINGOOPHORECTOMY     Family History  Problem Relation Age of Onset  . Stomach cancer Mother   . Cancer Brother   . Breast cancer Sister   . Breast cancer Sister    History  Sexual Activity  . Sexual activity: Not Currently    Outpatient Encounter  Prescriptions as of 04/11/2017  Medication Sig  . ALPRAZolam (XANAX) 0.5 MG tablet Take 1 tablet (0.5 mg total) by mouth 3 (three) times daily as needed.  .Marland Kitchenaspirin EC 81 MG EC tablet Take 1 tablet (81 mg total) by mouth daily.  . brimonidine (ALPHAGAN) 0.15 % ophthalmic solution Place 1 drop into both eyes 3 (three) times daily.  . Cyanocobalamin (B-12 COMPLIANCE INJECTION) 1000 MCG/ML KIT Inject 1,000 mcg as directed every 30 (thirty) days.  . dorzolamide (TRUSOPT) 2 % ophthalmic solution Place 1 drop into both eyes 3 (three) times daily.  . furosemide (LASIX) 20 MG tablet Take 1 tablet (20 mg total) by mouth daily.  . potassium chloride SA (K-DUR,KLOR-CON) 20 MEQ tablet Take 1 tablet (20 mEq total) by mouth 2 (two) times daily.  . pravastatin (PRAVACHOL) 20 MG tablet Take 1 tablet (20 mg total) by mouth at bedtime. (Patient not taking: Reported on 04/11/2017)  . [EXPIRED] cyanocobalamin ((VITAMIN B-12)) injection 1,000 mcg    No facility-administered encounter medications on file as of 04/11/2017.     Activities of Daily Living In your present state of health, do you have any difficulty performing the following activities: 04/11/2017 02/28/2017  Hearing? N N  Vision? N N  Difficulty concentrating or making decisions? N N  Walking or climbing stairs? N N  Dressing or bathing? N N  Doing errands, shopping? N N  Preparing Food and eating ? N -  Using the Toilet? N -  In the past six months, have you accidently leaked urine? N -  Do you have problems with loss of bowel control? N -  Managing your Medications? N -  Managing your Finances? N -  Housekeeping or managing your Housekeeping? N -  Some recent data might be hidden    Patient Care Team: Janith Lima, MD as PCP - General (Internal Medicine)    Assessment:    Physical assessment deferred to PCP.  Exercise Activities and Dietary recommendations Current Exercise Habits: The patient does not participate in regular  exercise at present (patient reports that she stays active daily being caregiver of her husband and taking care of grandchildren)  Diet (meal preparation, eat out, water intake, caffeinated beverages, dairy products, fruits and vegetables): in general, a "healthy" diet  , well balanced , eats a variety of fruits and vegetables daily, limits salt, fat/cholesterol, sugar, caffeine, drinks 6-8 glasses of water daily.  Reviewed heart healthy diet  Goals    . Continue to take time for myself daily          I will continue to read the bible and enjoy cooking daily. Enjoy life, family and worship God.       Fall Risk Fall Risk  04/11/2017 02/28/2017 11/29/2015 01/04/2013  Falls in the past year? No No No No   Depression Screen PHQ 2/9 Scores 04/11/2017 03/09/2017 02/28/2017 11/29/2015  PHQ - 2 Score 2 0 0 0  PHQ- 9 Score 2 0 - -     Cognitive Function MMSE - Mini Mental State Exam 04/11/2017  Orientation to time 5  Orientation to Place 5  Registration 3  Attention/ Calculation 5  Recall 2  Language- name 2 objects 2  Language- repeat 1  Language- follow 3 step command 3  Language- read & follow direction 1  Write a sentence 1  Copy design 1  Total score 29        Immunization History  Administered Date(s) Administered  . Influenza Split 03/27/2011, 03/30/2012  . Influenza Whole 04/06/2009, 04/12/2010  . Influenza, High Dose Seasonal PF 03/09/2017  . Influenza,inj,Quad PF,6+ Mos 04/18/2013, 02/19/2014, 03/31/2015, 03/04/2016  . Pneumococcal Conjugate-13 03/18/2016  . Pneumococcal Polysaccharide-23 03/21/2007  . Tdap 10/17/2011   Screening Tests Health Maintenance  Topic Date Due  . TETANUS/TDAP  10/16/2021  . INFLUENZA VACCINE  Completed  . PNA vac Low Risk Adult  Completed  . DEXA SCAN  Excluded      Plan:    Continue doing brain stimulating activities (puzzles, reading, adult coloring books, staying active) to keep memory sharp.   Continue to eat heart healthy diet  (full of fruits, vegetables, whole grains, lean protein, water--limit salt, fat, and sugar intake) and increase physical activity as tolerated.  I have personally reviewed and noted the following in the patient's chart:   . Medical and social history . Use of alcohol, tobacco or illicit drugs  . Current medications and supplements . Functional ability and status . Nutritional status . Physical activity . Advanced directives . List of other physicians . Vitals . Screenings to include cognitive, depression, and falls . Referrals and appointments  In addition, I have reviewed and discussed with patient certain preventive protocols, quality metrics, and best practice recommendations. A written personalized care plan for preventive services as well as general preventive health recommendations  were provided to patient.     Michiel Cowboy, RN  04/11/2017  Medical screening examination/treatment/procedure(s) were performed by non-physician practitioner and as supervising physician I was immediately available for consultation/collaboration. I agree with above. Scarlette Calico, MD

## 2017-04-11 ENCOUNTER — Telehealth: Payer: Self-pay | Admitting: *Deleted

## 2017-04-11 ENCOUNTER — Ambulatory Visit (INDEPENDENT_AMBULATORY_CARE_PROVIDER_SITE_OTHER): Payer: Medicare Other | Admitting: *Deleted

## 2017-04-11 ENCOUNTER — Other Ambulatory Visit: Payer: Self-pay | Admitting: Internal Medicine

## 2017-04-11 VITALS — BP 136/84 | HR 62 | Resp 20 | Ht 63.0 in | Wt 177.0 lb

## 2017-04-11 DIAGNOSIS — Z0000 Encounter for general adult medical examination without abnormal findings: Secondary | ICD-10-CM

## 2017-04-11 DIAGNOSIS — D518 Other vitamin B12 deficiency anemias: Secondary | ICD-10-CM

## 2017-04-11 DIAGNOSIS — E785 Hyperlipidemia, unspecified: Secondary | ICD-10-CM

## 2017-04-11 MED ORDER — CYANOCOBALAMIN 1000 MCG/ML IJ SOLN
1000.0000 ug | Freq: Once | INTRAMUSCULAR | Status: AC
  Administered 2017-04-11: 1000 ug via INTRAMUSCULAR

## 2017-04-11 NOTE — Telephone Encounter (Signed)
Called patient to inform that her PCP stated for her to not take pravastatin if it is causing her to have muscle aches. Patient verbalized understanding and was appreciative for the call.

## 2017-04-11 NOTE — Patient Instructions (Signed)
Continue doing brain stimulating activities (puzzles, reading, adult coloring books, staying active) to keep memory sharp.   Continue to eat heart healthy diet (full of fruits, vegetables, whole grains, lean protein, water--limit salt, fat, and sugar intake) and increase physical activity as tolerated.   [name] , Thank you for taking time to come for your Medicare Wellness Visit. I appreciate your ongoing commitment to your health goals. Please review the following plan we discussed and let me know if I can assist you in the future.   These are the goals we discussed: Goals    . Continue to take time for myself daily          I will continue to read the bible and enjoy cooking daily. Enjoy life, family and worship God.        This is a list of the screening recommended for you and due dates:  Health Maintenance  Topic Date Due  . Tetanus Vaccine  10/16/2021  . Flu Shot  Completed  . Pneumonia vaccines  Completed  . DEXA scan (bone density measurement)  Excluded

## 2017-04-11 NOTE — Telephone Encounter (Signed)
She should stop taking it if it causes muscle aches

## 2017-04-11 NOTE — Telephone Encounter (Signed)
During AWV, patient stated that she has not taken pravastatin within the last month. Explaining this medication was prescribed by the doctor when she discharged from the hospital after experiencing a TIA. Patient states that she received no refills for the medication the last time she saw PCP and asked if she should continue this medication. Also adding that this medication does cause her to have muscle aches and it is her preference not to take it if PCP feels it is not indicated.

## 2017-05-01 ENCOUNTER — Other Ambulatory Visit: Payer: Self-pay | Admitting: Internal Medicine

## 2017-05-01 DIAGNOSIS — F411 Generalized anxiety disorder: Secondary | ICD-10-CM

## 2017-05-01 NOTE — Telephone Encounter (Signed)
rx faxed to [facility].  

## 2017-06-23 ENCOUNTER — Ambulatory Visit: Payer: Medicare Other | Admitting: Family Medicine

## 2017-06-23 ENCOUNTER — Encounter: Payer: Self-pay | Admitting: Family Medicine

## 2017-06-23 ENCOUNTER — Other Ambulatory Visit: Payer: Self-pay | Admitting: Internal Medicine

## 2017-06-23 VITALS — BP 120/70 | HR 68 | Temp 97.9°F | Ht 63.0 in | Wt 173.0 lb

## 2017-06-23 DIAGNOSIS — J440 Chronic obstructive pulmonary disease with acute lower respiratory infection: Secondary | ICD-10-CM | POA: Diagnosis not present

## 2017-06-23 DIAGNOSIS — J4521 Mild intermittent asthma with (acute) exacerbation: Secondary | ICD-10-CM | POA: Diagnosis not present

## 2017-06-23 DIAGNOSIS — J209 Acute bronchitis, unspecified: Secondary | ICD-10-CM | POA: Diagnosis not present

## 2017-06-23 DIAGNOSIS — J45909 Unspecified asthma, uncomplicated: Secondary | ICD-10-CM | POA: Insufficient documentation

## 2017-06-23 DIAGNOSIS — F411 Generalized anxiety disorder: Secondary | ICD-10-CM

## 2017-06-23 MED ORDER — AZITHROMYCIN 250 MG PO TABS: ORAL_TABLET | ORAL | 0 refills | Status: DC

## 2017-06-23 MED ORDER — PREDNISONE 20 MG PO TABS: 20.0000 mg | ORAL_TABLET | Freq: Two times a day (BID) | ORAL | 0 refills | Status: AC

## 2017-06-23 NOTE — Progress Notes (Signed)
Subjective:  Patient ID: [name], female    DOB: November 23, 1940  Age: 77 y.o. MRN: [id number]  CC: URI   HPI [name] presents for evaluation and treatment of a 5-day history of a dry cough associated with wheezing.  She has had some ear congestion and pressure.  She has a history of tube placement in both ears.  She says that the tube came out of the right ear but she thinks is it is still in place on the left side.  She has tried no medicines for this.  She has no asthma history.  She has an inhaler at home that she says is still an day.  She is exposed to tobacco smoke at home.  Outpatient Medications Prior to Visit  Medication Sig Dispense Refill  . ALPRAZolam (XANAX) 0.5 MG tablet TAKE 1 TABLET BY MOUTH THREE TIMES DAILY AS NEEDED 90 tablet 2  . aspirin EC 81 MG EC tablet Take 1 tablet (81 mg total) by mouth daily. 30 tablet 0  . brimonidine (ALPHAGAN) 0.15 % ophthalmic solution Place 1 drop into both eyes 3 (three) times daily.  3  . Cyanocobalamin (B-12 COMPLIANCE INJECTION) 1000 MCG/ML KIT Inject 1,000 mcg as directed every 30 (thirty) days.    . dorzolamide (TRUSOPT) 2 % ophthalmic solution Place 1 drop into both eyes 3 (three) times daily.  1  . furosemide (LASIX) 20 MG tablet Take 1 tablet (20 mg total) by mouth daily. 90 tablet 3  . potassium chloride SA (K-DUR,KLOR-CON) 20 MEQ tablet Take 1 tablet (20 mEq total) by mouth 2 (two) times daily. 180 tablet 1  . pravastatin (PRAVACHOL) 20 MG tablet Take 1 tablet (20 mg total) by mouth at bedtime. (Patient not taking: Reported on 04/11/2017) 30 tablet 0   No facility-administered medications prior to visit.     ROS Review of Systems  Constitutional: Positive for fatigue. Negative for chills, fever and unexpected weight change.  HENT: Positive for ear pain and hearing loss. Negative for congestion, postnasal drip, sinus pressure, sinus pain, sore throat and trouble swallowing.   Eyes: Negative.   Respiratory: Positive for  cough and wheezing. Negative for shortness of breath.   Cardiovascular: Negative.   Gastrointestinal: Negative.   Musculoskeletal: Negative for arthralgias and myalgias.  Skin: Negative for rash.  Neurological: Negative for weakness and headaches.  Psychiatric/Behavioral: Negative.     Objective:  BP 120/70 (BP Location: Left Arm, Patient Position: Sitting, Cuff Size: Normal)   Pulse 68   Temp 97.9 F (36.6 C) (Oral)   Ht 5' 3"  (1.6 m)   Wt 173 lb (78.5 kg)   SpO2 97%   BMI 30.65 kg/m   BP Readings from Last 3 Encounters:  06/23/17 120/70  04/11/17 136/84  03/09/17 124/80    Wt Readings from Last 3 Encounters:  06/23/17 173 lb (78.5 kg)  04/11/17 177 lb (80.3 kg)  03/09/17 174 lb (78.9 kg)    Physical Exam  Constitutional: She is oriented to person, place, and time. She appears well-developed and well-nourished. No distress.  HENT:  Head: Normocephalic and atraumatic.  Right Ear: External ear normal. Tympanic membrane is not erythematous and not retracted.  Left Ear: External ear normal. Tympanic membrane is not erythematous.  Ears:  Mouth/Throat: Oropharynx is clear and moist. No oropharyngeal exudate.  Eyes: Conjunctivae are normal. Pupils are equal, round, and reactive to light. Right eye exhibits no discharge. Left eye exhibits no discharge. No scleral icterus.  Neck: Neck  supple. No JVD present. No tracheal deviation present. No thyromegaly present.  Cardiovascular: Normal rate, regular rhythm and normal heart sounds.  Pulmonary/Chest: Effort normal and breath sounds normal. No stridor. No respiratory distress. She has no wheezes. She has no rales.  Lymphadenopathy:    She has no cervical adenopathy.  Neurological: She is alert and oriented to person, place, and time.  Skin: Skin is warm and dry. She is not diaphoretic.  Psychiatric: She has a normal mood and affect. Her behavior is normal.    Lab Results  Component Value Date   WBC 5.4 02/28/2017   HGB  13.6 02/28/2017   HCT 40.0 02/28/2017   PLT 115 (L) 02/28/2017   GLUCOSE 103 (H) 02/28/2017   CHOL 215 (H) 03/01/2017   TRIG 160 (H) 03/01/2017   HDL 28 (L) 03/01/2017   LDLDIRECT 159.0 12/02/2014   LDLCALC 155 (H) 03/01/2017   ALT 23 02/28/2017   AST 30 02/28/2017   NA 138 02/28/2017   K 4.2 02/28/2017   CL 109 02/28/2017   CREATININE 0.70 02/28/2017   BUN 19 02/28/2017   CO2 24 02/28/2017   TSH 3.65 12/13/2016   INR 0.98 02/28/2017   HGBA1C 4.9 03/01/2017    Ct Angio Head W Or Wo Contrast  Result Date: 02/28/2017 CLINICAL DATA:  Headache and aphasia. EXAM: CT ANGIOGRAPHY HEAD AND NECK TECHNIQUE: Multidetector CT imaging of the head and neck was performed using the standard protocol during bolus administration of intravenous contrast. Multiplanar CT image reconstructions and MIPs were obtained to evaluate the vascular anatomy. Carotid stenosis measurements (when applicable) are obtained utilizing NASCET criteria, using the distal internal carotid diameter as the denominator. CONTRAST:  100 mL Isovue 370 COMPARISON:  Head CT and MRI earlier today.  Head MRA 07/14/2011. FINDINGS: CTA NECK FINDINGS Aortic arch: Standard 3 vessel aortic arch with mild atherosclerotic plaque. Patent brachiocephalic and subclavian arteries. Eccentric calcified plaque in the proximal left subclavian artery resulting in less than 50% narrowing. Right carotid system: Patent without evidence of dissection or stenosis. Mild calcified plaque at the carotid bifurcation. Left carotid system: Patent without evidence of dissection, stenosis, or significant atherosclerosis. Vertebral arteries: Patent without definite evidence of stenosis or dissection. The proximal left V1 segment is partially obscured by streak artifact from adjacent venous contrast. Skeleton: Mild multilevel cervical disc and facet degeneration. Other neck: No mass or lymph node enlargement. Upper chest: Motion artifact in the lung apices. Punctate  calcified granuloma in the right lung apex. Review of the MIP images confirms the above findings CTA HEAD FINDINGS Anterior circulation: The internal carotid arteries are patent from skullbase to carotid termini. There is mild-to-moderate calcified atherosclerosis involving the carotid siphons without significant stenosis. The ACAs and MCAs are patent without evidence of proximal branch occlusion or significant proximal stenosis. The right ACA is dominant. No aneurysm. Posterior circulation: The intracranial vertebral arteries are widely patent to the basilar. Patent PICA, AICA, and SCA origins are visualized bilaterally. The basilar artery is somewhat small in caliber diffusely on a developmental basis without focal stenosis. There are fetal type origins of the PCAs with extremely hypoplastic P1 segments bilaterally. No significant PCA stenosis is seen. No aneurysm. Venous sinuses: Patent. Anatomic variants: Fetal origins of the PCAs.  Hypoplastic left ACA. Delayed phase: No abnormal enhancement. Review of the MIP images confirms the above findings IMPRESSION: 1. Mild intracranial and cervical carotid artery atherosclerosis without large vessel occlusion or significant stenosis. 2.  Aortic Atherosclerosis (ICD10-I70.0). Electronically Signed  By: Logan Bores M.D.   On: 02/28/2017 16:08   Ct Head Wo Contrast  Result Date: 02/28/2017 CLINICAL DATA:  Headache and aphasia. EXAM: CT HEAD WITHOUT CONTRAST TECHNIQUE: Contiguous axial images were obtained from the base of the skull through the vertex without intravenous contrast. COMPARISON:  MRI orbits dated December 05, 2013. MRI brain dated July 14, 2011. FINDINGS: Brain: No evidence of acute infarction, hemorrhage, hydrocephalus, extra-axial collection or mass lesion/mass effect. Mild periventricular white matter and corona radiata hypodensities favor chronic ischemic microvascular white matter disease. Vascular: No hyperdense vessel. Atherosclerotic vascular  calcification of the carotid siphons. Skull: Normal. Negative for fracture or focal lesion. Sinuses/Orbits: The bilateral paranasal sinuses and mastoid air cells are clear. The orbits are unremarkable. Other: None. IMPRESSION: 1.  No acute intracranial abnormality. Electronically Signed   By: Titus Dubin M.D.   On: 02/28/2017 11:17   Ct Angio Neck W And/or Wo Contrast  Result Date: 02/28/2017 CLINICAL DATA:  Headache and aphasia. EXAM: CT ANGIOGRAPHY HEAD AND NECK TECHNIQUE: Multidetector CT imaging of the head and neck was performed using the standard protocol during bolus administration of intravenous contrast. Multiplanar CT image reconstructions and MIPs were obtained to evaluate the vascular anatomy. Carotid stenosis measurements (when applicable) are obtained utilizing NASCET criteria, using the distal internal carotid diameter as the denominator. CONTRAST:  100 mL Isovue 370 COMPARISON:  Head CT and MRI earlier today.  Head MRA 07/14/2011. FINDINGS: CTA NECK FINDINGS Aortic arch: Standard 3 vessel aortic arch with mild atherosclerotic plaque. Patent brachiocephalic and subclavian arteries. Eccentric calcified plaque in the proximal left subclavian artery resulting in less than 50% narrowing. Right carotid system: Patent without evidence of dissection or stenosis. Mild calcified plaque at the carotid bifurcation. Left carotid system: Patent without evidence of dissection, stenosis, or significant atherosclerosis. Vertebral arteries: Patent without definite evidence of stenosis or dissection. The proximal left V1 segment is partially obscured by streak artifact from adjacent venous contrast. Skeleton: Mild multilevel cervical disc and facet degeneration. Other neck: No mass or lymph node enlargement. Upper chest: Motion artifact in the lung apices. Punctate calcified granuloma in the right lung apex. Review of the MIP images confirms the above findings CTA HEAD FINDINGS Anterior circulation: The  internal carotid arteries are patent from skullbase to carotid termini. There is mild-to-moderate calcified atherosclerosis involving the carotid siphons without significant stenosis. The ACAs and MCAs are patent without evidence of proximal branch occlusion or significant proximal stenosis. The right ACA is dominant. No aneurysm. Posterior circulation: The intracranial vertebral arteries are widely patent to the basilar. Patent PICA, AICA, and SCA origins are visualized bilaterally. The basilar artery is somewhat small in caliber diffusely on a developmental basis without focal stenosis. There are fetal type origins of the PCAs with extremely hypoplastic P1 segments bilaterally. No significant PCA stenosis is seen. No aneurysm. Venous sinuses: Patent. Anatomic variants: Fetal origins of the PCAs.  Hypoplastic left ACA. Delayed phase: No abnormal enhancement. Review of the MIP images confirms the above findings IMPRESSION: 1. Mild intracranial and cervical carotid artery atherosclerosis without large vessel occlusion or significant stenosis. 2.  Aortic Atherosclerosis (ICD10-I70.0). Electronically Signed   By: Logan Bores M.D.   On: 02/28/2017 16:08   Mr Brain Wo Contrast  Result Date: 02/28/2017 CLINICAL DATA:  Headache and aphasia. EXAM: MRI HEAD WITHOUT CONTRAST TECHNIQUE: Multiplanar, multiecho pulse sequences of the brain and surrounding structures were obtained without intravenous contrast. COMPARISON:  Head CT 02/28/2017. MRI orbits 12/05/2013. MRI brain 07/14/2011. FINDINGS:  Brain: There is no evidence of acute infarct, intracranial hemorrhage, mass, midline shift, or extra-axial fluid collection. The ventricles and sulci are normal. Small foci of T2 hyperintensity scattered throughout the subcortical and deep cerebral white matter bilaterally have slightly progressed from 2013 and are nonspecific but compatible with mild chronic small vessel ischemic disease. Vascular: Major intracranial vascular flow  voids are preserved. Skull and upper cervical spine: Unremarkable bone marrow signal. Sinuses/Orbits: Unremarkable orbits. Paranasal sinuses and mastoid air cells are clear. Other: None. IMPRESSION: 1. No acute intracranial abnormality. 2. Mild chronic small vessel ischemic disease, slightly progressed from 2013. Electronically Signed   By: Logan Bores M.D.   On: 02/28/2017 12:05    Assessment & Plan:   Syrah was seen today for uri.  Diagnoses and all orders for this visit:  Mild intermittent reactive airway disease with acute exacerbation -     predniSONE (DELTASONE) 20 MG tablet; Take 1 tablet (20 mg total) by mouth 2 (two) times daily with a meal for 7 days.  Acute bronchitis with COPD ([place]) -     azithromycin (ZITHROMAX) 250 MG tablet; Two tablets day one, then one tablet daily next 4 days. -     predniSONE (DELTASONE) 20 MG tablet; Take 1 tablet (20 mg total) by mouth 2 (two) times daily with a meal for 7 days.   I have discontinued [name]'s pravastatin. I am also having her start on azithromycin and predniSONE. Additionally, I am having her maintain her potassium chloride SA, brimonidine, dorzolamide, Cyanocobalamin, aspirin, furosemide, and ALPRAZolam.   She requested a refill of her Xanax.  My able and talented CMA discovered that she had been given 2 refills but the pharmacy did not except the.  Patient's primary doctor's cma corrected this with the pharmacy. Encouraged her to discuss alternative anxiety meds with her primary doctor.   Meds ordered this encounter  Medications  . azithromycin (ZITHROMAX) 250 MG tablet    Sig: Two tablets day one, then one tablet daily next 4 days.    Dispense:  6 tablet    Refill:  0  . predniSONE (DELTASONE) 20 MG tablet    Sig: Take 1 tablet (20 mg total) by mouth 2 (two) times daily with a meal for 7 days.    Dispense:  14 tablet    Refill:  0     Follow-up: Return if symptoms worsen or fail to improve.  Libby Maw, MD  Subjective:    Review of Systems   Objective:     Assessment:     Plan:

## 2017-06-23 NOTE — Telephone Encounter (Signed)
Pharmacy did not have the refills on file. Called the remaining amount in from what was rxed in December.

## 2017-06-23 NOTE — Patient Instructions (Signed)
  Place upper respiratory infection patient instructions here.

## 2017-07-06 ENCOUNTER — Emergency Department (HOSPITAL_COMMUNITY): Payer: Medicare Other

## 2017-07-06 ENCOUNTER — Encounter (HOSPITAL_COMMUNITY): Payer: Self-pay | Admitting: Pharmacy Technician

## 2017-07-06 ENCOUNTER — Ambulatory Visit: Payer: Self-pay | Admitting: *Deleted

## 2017-07-06 ENCOUNTER — Emergency Department (HOSPITAL_COMMUNITY)
Admission: EM | Admit: 2017-07-06 | Discharge: 2017-07-06 | Disposition: A | Discharge status: Home / Self Care | Payer: Medicare Other | Attending: Emergency Medicine | Admitting: Emergency Medicine

## 2017-07-06 ENCOUNTER — Other Ambulatory Visit: Payer: Self-pay

## 2017-07-06 DIAGNOSIS — R0789 Other chest pain: Secondary | ICD-10-CM | POA: Insufficient documentation

## 2017-07-06 DIAGNOSIS — R079 Chest pain, unspecified: Secondary | ICD-10-CM | POA: Diagnosis present

## 2017-07-06 DIAGNOSIS — Z79899 Other long term (current) drug therapy: Secondary | ICD-10-CM | POA: Insufficient documentation

## 2017-07-06 DIAGNOSIS — M791 Myalgia, unspecified site: Secondary | ICD-10-CM

## 2017-07-06 DIAGNOSIS — M7918 Myalgia, other site: Secondary | ICD-10-CM | POA: Diagnosis not present

## 2017-07-06 DIAGNOSIS — J189 Pneumonia, unspecified organism: Secondary | ICD-10-CM

## 2017-07-06 DIAGNOSIS — J181 Lobar pneumonia, unspecified organism: Secondary | ICD-10-CM | POA: Insufficient documentation

## 2017-07-06 DIAGNOSIS — Z7982 Long term (current) use of aspirin: Secondary | ICD-10-CM | POA: Insufficient documentation

## 2017-07-06 DIAGNOSIS — I10 Essential (primary) hypertension: Secondary | ICD-10-CM | POA: Diagnosis not present

## 2017-07-06 DIAGNOSIS — M5489 Other dorsalgia: Secondary | ICD-10-CM | POA: Diagnosis not present

## 2017-07-06 DIAGNOSIS — I2510 Atherosclerotic heart disease of native coronary artery without angina pectoris: Secondary | ICD-10-CM | POA: Insufficient documentation

## 2017-07-06 DIAGNOSIS — M546 Pain in thoracic spine: Secondary | ICD-10-CM

## 2017-07-06 LAB — I-STAT TROPONIN, ED
Troponin i, poc: 0 ng/mL (ref 0.00–0.08)
Troponin i, poc: 0 ng/mL (ref 0.00–0.08)

## 2017-07-06 LAB — CBC
HEMATOCRIT: 46.6 % — AB (ref 36.0–46.0)
Hemoglobin: 15.3 g/dL — ABNORMAL HIGH (ref 12.0–15.0)
MCH: 28.9 pg (ref 26.0–34.0)
MCHC: 32.8 g/dL (ref 30.0–36.0)
MCV: 87.9 fL (ref 78.0–100.0)
Platelets: 127 10*3/uL — ABNORMAL LOW (ref 150–400)
RBC: 5.3 MIL/uL — ABNORMAL HIGH (ref 3.87–5.11)
RDW: 14.2 % (ref 11.5–15.5)
WBC: 4.6 10*3/uL (ref 4.0–10.5)

## 2017-07-06 LAB — BASIC METABOLIC PANEL
Anion gap: 10 (ref 5–15)
BUN: 14 mg/dL (ref 6–20)
CO2: 23 mmol/L (ref 22–32)
Calcium: 9.3 mg/dL (ref 8.9–10.3)
Chloride: 106 mmol/L (ref 101–111)
Creatinine, Ser: 0.84 mg/dL (ref 0.44–1.00)
GFR calc Af Amer: >60 mL/min (ref >60)
GFR calc non Af Amer: >60 mL/min (ref >60)
GLUCOSE: 113 mg/dL — AB (ref 65–99)
POTASSIUM: 4.1 mmol/L (ref 3.5–5.1)
Sodium: 139 mmol/L (ref 135–145)

## 2017-07-06 MED ORDER — DOXYCYCLINE HYCLATE 100 MG PO CAPS: 100.0000 mg | ORAL_CAPSULE | Freq: Two times a day (BID) | ORAL | 0 refills | Status: DC

## 2017-07-06 MED ORDER — IOPAMIDOL (ISOVUE-370) INJECTION 76%
INTRAVENOUS | Status: AC
  Administered 2017-07-06: 100 mL
  Filled 2017-07-06: qty 100

## 2017-07-06 MED ORDER — CYCLOBENZAPRINE HCL 10 MG PO TABS: 10.0000 mg | ORAL_TABLET | Freq: Two times a day (BID) | ORAL | 0 refills | Status: DC | PRN

## 2017-07-06 NOTE — ED Notes (Signed)
Got patient hooked up to the monitor patient is resting with call bell in reach and family at bedside

## 2017-07-06 NOTE — Telephone Encounter (Signed)
Called in c/o a dull ache starting under her right shoulder blade and radiating down under her right breast and across her back between her shoulder blades.  This has been going on since Monday night when it woke her up during her sleep. She is having nausea too.  Denies being sweaty, clammy or having shortness of breath except mild shortness of breath when walking a  Lot.  I have referred her to the ED.   She is going to have her husband take her to [facility] ED now.   I let her know I would let Dr. Ronnald Ramp know of our conversation.  I routed a note to Dr. Adah Salvage nurse pool. Reason for Disposition . [1] Chest pain lasts > 5 minutes AND [2] age > 33  Answer Assessment - Initial Assessment Questions 1. LOCATION: "Where does it hurt?"       Right shoulder blade and it spreads to under my right breast also having pain in my back across my shoulder blades. 2. RADIATION: "Does the pain go anywhere else?" (e.g., into neck, jaw, arms, back)     No 3. ONSET: "When did the chest pain begin?" (Minutes, hours or days)      Monday night 4. PATTERN "Does the pain come and go, or has it been constant since it started?"  "Does it get worse with exertion?"      It's a dull ache that has been there since Monday day.   The pain woke me up during the night. 5. DURATION: "How long does it last" (e.g., seconds, minutes, hours)     Constant.  I get a little short of breath when I walk around 6. SEVERITY: "How bad is the pain?"  (e.g., Scale 1-10; mild, moderate, or severe)    - MILD (1-3): doesn't interfere with normal activities     - MODERATE (4-7): interferes with normal activities or awakens from sleep    - SEVERE (8-10): excruciating pain, unable to do any normal activities       6-7 on pain scale 7. CARDIAC RISK FACTORS: "Do you have any history of heart problems or risk factors for heart disease?" (e.g., prior heart attack, angina; high blood pressure, diabetes, being overweight, high cholesterol,  smoking, or strong family history of heart disease)     I wore a heart monitor 5 years ago.   It didn't show anything.  I passed out the day after they took it off.    I saw Dr. Ron Parker, cardiologist and they didn't know what caused me to pass out. 8. PULMONARY RISK FACTORS: "Do you have any history of lung disease?"  (e.g., blood clots in lung, asthma, emphysema, birth control pills)     None 9. CAUSE: "What do you think is causing the chest pain?"     I was wondering if I have pneumonia.   It like that but I feel different 10. OTHER SYMPTOMS: "Do you have any other symptoms?" (e.g., dizziness, nausea, vomiting, sweating, fever, difficulty breathing, cough)       I am a little nauseated.  I have IBS.  No sweatiness or clammy.   Yesterday I felt swimmy headed for a while then it went away. 11. PREGNANCY: "Is there any chance you are pregnant?" "When was your last menstrual period?"       Not asked due to age.  Protocols used: CHEST PAIN-A-AH

## 2017-07-06 NOTE — ED Provider Notes (Signed)
[facility] Provider Note   CSN: [id number] Arrival date & time: 07/06/17  0950     History   Chief Complaint Chief Complaint  Patient presents with  . Chest Pain    HPI [name] is a 77 y.o. female.  HPI   Constant pain since Monday night. Right sided under scapula with radiation straight through to the chest.  Dull achy pain. Yesterday was lightheaded with it, today nausea with it, but those symptoms improved. Reports maybe a little bit of shortness of breath but not much. No abdominal pain.  No vomiting. No fever. Occasional cough.  Not worse with exertion, not changing with positions, not worse with eating.  No hx of pain like this before.  No leg pain or swelling.  No diarrhea, no black or bloody stools.   No hx of smoking, mom no hx of CAD No hx of stents, had nonobstructive disease on Cath 2009 2015 had stress test  Past Medical History:  Diagnosis Date  . Anxiety state, unspecified   . CAD (coronary artery disease)   . Disorder of bone and cartilage, unspecified   . Diverticulosis of colon (without mention of hemorrhage)   . Dizziness and giddiness   . Fatty liver   . Fibromyalgia   . Obesity, unspecified   . Other and unspecified hyperlipidemia   . Other chronic nonalcoholic liver disease   . Skin cancer   . TIA (transient ischemic attack)   . Unspecified essential hypertension     Patient Active Problem List   Diagnosis Date Noted  . Reactive airway disease 06/23/2017  . Acute bronchitis with COPD ([place]) 06/23/2017  . TIA (transient ischemic attack) 02/28/2017  . Depression with somatization 12/13/2016  . Allergic rhinitis due to pollen 06/21/2016  . Simple chronic bronchitis ([place]) 06/21/2016  . Routine general medical examination at a health care facility 11/29/2015  . GAD (generalized anxiety disorder) 12/02/2014  . IBS (irritable bowel syndrome) 01/20/2014  . B12 deficiency anemia 07/10/2013  .  Unspecified vitamin D deficiency 01/04/2013  . HTN (hypertension) 07/26/2011  . CAD (coronary artery disease) 07/26/2011  . FATTY LIVER DISEASE 06/20/2009  . OBESITY 09/08/2007  . OSTEOPENIA 09/07/2007  . Hyperlipidemia with target LDL less than 100 07/30/2007    Past Surgical History:  Procedure Laterality Date  . BACK SURGERY    . CHOLECYSTECTOMY    . MOHS SURGERY    . TONSILLECTOMY AND ADENOIDECTOMY    . TOTAL ABDOMINAL HYSTERECTOMY W/ BILATERAL SALPINGOOPHORECTOMY      OB History    No data available       Home Medications    Prior to Admission medications   Medication Sig Start Date End Date Taking? Authorizing Provider  ALPRAZolam (XANAX) 0.5 MG tablet TAKE 1 TABLET BY MOUTH THREE TIMES DAILY AS NEEDED Patient taking differently: 0.5MG BY MOUTH THREE TIMES DAILY AS NEEDED FOR ANXIETY 05/01/17  Yes Janith Lima, MD  aspirin EC 81 MG EC tablet Take 1 tablet (81 mg total) by mouth daily. 03/02/17  Yes Regalado, Belkys A, MD  brimonidine (ALPHAGAN) 0.15 % ophthalmic solution Place 1 drop into both eyes 3 (three) times daily. 01/20/17  Yes [provider]  Cyanocobalamin (B-12 COMPLIANCE INJECTION) 1000 MCG/ML KIT Inject 1,000 mcg as directed every 30 (thirty) days.   Yes [provider]  dorzolamide (TRUSOPT) 2 % ophthalmic solution Place 1 drop into both eyes 3 (three) times daily. 02/06/17  Yes [provider]  furosemide (LASIX) 20 MG tablet Take 1 tablet (20 mg total) by mouth daily. 03/28/17  Yes Janith Lima, MD  potassium chloride SA (K-DUR,KLOR-CON) 20 MEQ tablet Take 1 tablet (20 mEq total) by mouth 2 (two) times daily. 12/23/16  Yes Janith Lima, MD  azithromycin (ZITHROMAX) 250 MG tablet Two tablets day one, then one tablet daily next 4 days. Patient not taking: Reported on 07/06/2017 06/23/17   Libby Maw, MD  cyclobenzaprine (FLEXERIL) 10 MG tablet Take 1 tablet (10 mg total) by mouth 2 (two) times daily as needed for muscle  spasms. 07/06/17   Gareth Morgan, MD  doxycycline (VIBRAMYCIN) 100 MG capsule Take 1 capsule (100 mg total) by mouth 2 (two) times daily for 7 days. 07/06/17 07/13/17  Gareth Morgan, MD    Family History Family History  Problem Relation Age of Onset  . Stomach cancer Mother   . Cancer Brother   . Breast cancer Sister   . Breast cancer Sister     Social History Social History   Tobacco Use  . Smoking status: Never Smoker  . Smokeless tobacco: Never Used  Substance Use Topics  . Alcohol use: No  . Drug use: No     Allergies   Diltiazem hcl; Statins; Zetia [ezetimibe]; Codeine; Sulfonamide derivatives; Tape; and Tramadol   Review of Systems Review of Systems  Constitutional: Negative for fever.  HENT: Negative for sore throat.   Eyes: Negative for visual disturbance.  Respiratory: Positive for shortness of breath. Negative for cough.   Cardiovascular: Positive for chest pain.  Gastrointestinal: Positive for nausea. Negative for abdominal pain and vomiting.  Genitourinary: Negative for difficulty urinating.  Musculoskeletal: Positive for back pain. Negative for neck pain.  Skin: Negative for rash.  Neurological: Positive for light-headedness. Negative for syncope and headaches.     Physical Exam Updated Vital Signs BP (!) 143/63 (BP Location: Right Arm)   Pulse (!) 57   Temp 98.1 F (36.7 C) (Oral)   Resp 12   Ht 5' 3"  (1.6 m)   Wt 78.5 kg (173 lb)   SpO2 100%   BMI 30.65 kg/m   Physical Exam  Constitutional: She is oriented to person, place, and time. She appears well-developed and well-nourished. No distress.  HENT:  Head: Normocephalic and atraumatic.  Eyes: Conjunctivae and EOM are normal.  Neck: Normal range of motion.  Cardiovascular: Normal rate, regular rhythm, normal heart sounds and intact distal pulses. Exam reveals no gallop and no friction rub.  No murmur heard. Pulmonary/Chest: Effort normal and breath sounds normal. No respiratory  distress. She has no wheezes. She has no rales.  Abdominal: Soft. She exhibits no distension. There is no tenderness. There is no guarding.  Musculoskeletal: She exhibits no edema.       Thoracic back: She exhibits tenderness (below scapula).  Neurological: She is alert and oriented to person, place, and time.  Skin: Skin is warm and dry. No rash noted. She is not diaphoretic. No erythema.  Nursing note and vitals reviewed.    ED Treatments / Results  Labs (all labs ordered are listed, but only abnormal results are displayed) Labs Reviewed  BASIC METABOLIC PANEL - Abnormal; Notable for the following components:      Result Value   Glucose, Bld 113 (*)    All other components within normal limits  CBC - Abnormal; Notable for the following components:   RBC 5.30 (*)    Hemoglobin 15.3 (*)    HCT 46.6 (*)  Platelets 127 (*)    All other components within normal limits  I-STAT TROPONIN, ED  I-STAT TROPONIN, ED    EKG  EKG Interpretation  Date/Time:  Thursday July 06 2017 09:57:09 EST Ventricular Rate:  78 PR Interval:  174 QRS Duration: 84 QT Interval:  410 QTC Calculation: 467 R Axis:   -43 Text Interpretation:  Normal sinus rhythm Left axis deviation Abnormal ECG No significant change since last tracing Confirmed by Gareth Morgan [phone]) on 07/06/2017 12:09:59 PM       Radiology Dg Chest 2 View  Result Date: 07/06/2017 CLINICAL DATA:  Acute right-sided chest pain. EXAM: CHEST  2 VIEW COMPARISON:  06/21/2016. FINDINGS: Heart size is normal. There is aortic atherosclerosis. The lungs are clear except for minimal linear scarring in the mid portions on each side. Evidence of active infiltrate, mass, effusion or collapse. Ordinary degenerative changes affect the spine. IMPRESSION: No active disease.  Linear scarring on each side. Aortic atherosclerosis. Electronically Signed   By: Nelson Chimes M.D.   On: 07/06/2017 10:25   Ct Angio Chest Aorta W And/or Wo  Contrast  Result Date: 07/06/2017 CLINICAL DATA:  Chest pain, shortness of breath. EXAM: CT ANGIOGRAPHY CHEST WITH CONTRAST TECHNIQUE: Multidetector CT imaging of the chest was performed using the standard protocol during bolus administration of intravenous contrast. Multiplanar CT image reconstructions and MIPs were obtained to evaluate the vascular anatomy. CONTRAST:  179m ISOVUE-370 IOPAMIDOL (ISOVUE-370) INJECTION 76% COMPARISON:  Radiographs of same day. FINDINGS: Cardiovascular: Preferential opacification of the thoracic aorta. No evidence of thoracic aortic aneurysm or dissection. Normal heart size. No pericardial effusion. Mediastinum/Nodes: No enlarged mediastinal, hilar, or axillary lymph nodes. Thyroid gland, trachea, and esophagus demonstrate no significant findings. Lungs/Pleura: Left lung is clear. Minimal inflammatory changes or scarring is seen in right middle lobe. No pleural effusion or pneumothorax. Upper Abdomen: No acute abnormality. Musculoskeletal: No chest wall abnormality. No acute or significant osseous findings. Review of the MIP images confirms the above findings. IMPRESSION: Minimal inflammatory changes or scarring is seen in right middle lobe. No other significant abnormality seen in the chest. Electronically Signed   By: JMarijo Conception M.D.   On: 07/06/2017 13:28    Procedures Procedures (including critical care time)  Medications Ordered in ED Medications  iopamidol (ISOVUE-370) 76 % injection (100 mLs  Contrast Given 07/06/17 1259)     Initial Impression / Assessment and Plan / ED Course  I have reviewed the triage vital signs and the nursing notes.  Pertinent labs & imaging results that were available during my care of the patient were reviewed by me and considered in my medical decision making (see chart for details).     77year old female with a history of nonobstructive coronary artery disease, hypertension, anxiety, hyperlipidemia presents with concern  for right-sided thoracic back pain with radiation to the chest.  EKG shows no significant abnormalities.  Troponins negative x2.  CT dissection study done given chest pain radiating to the back shows no sign of dissection.  There was some inflammation in the right middle lobe versus scarring, and given patient with pain in this area, and reports occasional coughing, will cover for possible pneumonia with doxycycline.  Symptoms are atypical for angina, with reproducible back pain on exam, and 2 negative troponins several days after constant pain.  No sign of shingles, pain seems less consistent in quality. Given no signs of other acute abnormalities, pain reproducible, suspect this may be muscular pain vs possible pneumonia given CT  findings.  Given a prescription for Flexeril.  Recommend follow-up with primary care physician.  Final Clinical Impressions(s) / ED Diagnoses   Final diagnoses:  Right-sided chest pain  Acute right-sided thoracic back pain  Muscular pain  Community acquired pneumonia of right middle lobe of lung [facility])    ED Discharge Orders        Ordered    doxycycline (VIBRAMYCIN) 100 MG capsule  2 times daily     07/06/17 1434    cyclobenzaprine (FLEXERIL) 10 MG tablet  2 times daily PRN     07/06/17 1435       Gareth Morgan, MD 07/06/17 1809

## 2017-07-06 NOTE — ED Triage Notes (Signed)
Pt arrives pov with complaints of CP onset Monday night. Endorses nausea and sob. Pain radiates from R chest through to her back. Denies cardiac hx.

## 2017-07-06 NOTE — ED Notes (Signed)
Patient transported to CT 

## 2017-07-10 ENCOUNTER — Ambulatory Visit: Payer: Medicare Other | Admitting: Internal Medicine

## 2017-07-10 ENCOUNTER — Encounter: Payer: Self-pay | Admitting: Internal Medicine

## 2017-07-10 ENCOUNTER — Ambulatory Visit (INDEPENDENT_AMBULATORY_CARE_PROVIDER_SITE_OTHER)
Admission: RE | Admit: 2017-07-10 | Discharge: 2017-07-10 | Disposition: A | Discharge status: Home / Self Care | Payer: Medicare Other | Source: Ambulatory Visit | Attending: Internal Medicine | Admitting: Internal Medicine

## 2017-07-10 VITALS — BP 132/70 | HR 67 | Temp 97.9°F | Ht 63.0 in | Wt 172.2 lb

## 2017-07-10 DIAGNOSIS — J181 Lobar pneumonia, unspecified organism: Secondary | ICD-10-CM

## 2017-07-10 DIAGNOSIS — B028 Zoster with other complications: Secondary | ICD-10-CM | POA: Diagnosis not present

## 2017-07-10 DIAGNOSIS — J189 Pneumonia, unspecified organism: Secondary | ICD-10-CM

## 2017-07-10 MED ORDER — VALACYCLOVIR HCL 1 G PO TABS: 1000.0000 mg | ORAL_TABLET | Freq: Two times a day (BID) | ORAL | 0 refills | Status: AC

## 2017-07-10 MED ORDER — PREGABALIN ER 165 MG PO TB24: 1.0000 | ORAL_TABLET | Freq: Every day | ORAL | 0 refills | Status: DC

## 2017-07-10 NOTE — Progress Notes (Signed)
Subjective:  Patient ID: [name], female    DOB: Oct 23, 1940  Age: 77 y.o. MRN: [id number]  CC: Rash and Cough   HPI [name] presents for f/up - she complains of a one-week history of painful rash on her right posterior back.  She reports that the pain is severe and keeps her awake at night.  She was referred to the ED about this a week ago and no pathology was found other than there was concern for an interstitial pneumonia in the right middle lobe on her chest x-ray.  She tells me her cough has nearly resolved and when she does cough it is nonproductive.  She has completed a course of doxycycline.  Outpatient Medications Prior to Visit  Medication Sig Dispense Refill  . ALPRAZolam (XANAX) 0.5 MG tablet TAKE 1 TABLET BY MOUTH THREE TIMES DAILY AS NEEDED (Patient taking differently: 0.5MG BY MOUTH THREE TIMES DAILY AS NEEDED FOR ANXIETY) 90 tablet 2  . aspirin EC 81 MG EC tablet Take 1 tablet (81 mg total) by mouth daily. 30 tablet 0  . brimonidine (ALPHAGAN) 0.15 % ophthalmic solution Place 1 drop into both eyes 3 (three) times daily.  3  . Cyanocobalamin (B-12 COMPLIANCE INJECTION) 1000 MCG/ML KIT Inject 1,000 mcg as directed every 30 (thirty) days.    . dorzolamide (TRUSOPT) 2 % ophthalmic solution Place 1 drop into both eyes 3 (three) times daily.  1  . furosemide (LASIX) 20 MG tablet Take 1 tablet (20 mg total) by mouth daily. 90 tablet 3  . potassium chloride SA (K-DUR,KLOR-CON) 20 MEQ tablet Take 1 tablet (20 mEq total) by mouth 2 (two) times daily. 180 tablet 1  . azithromycin (ZITHROMAX) 250 MG tablet Two tablets day one, then one tablet daily next 4 days. (Patient not taking: Reported on 07/06/2017) 6 tablet 0  . cyclobenzaprine (FLEXERIL) 10 MG tablet Take 1 tablet (10 mg total) by mouth 2 (two) times daily as needed for muscle spasms. 20 tablet 0  . doxycycline (VIBRAMYCIN) 100 MG capsule Take 1 capsule (100 mg total) by mouth 2 (two) times daily for 7 days. 14 capsule 0    No facility-administered medications prior to visit.     ROS Review of Systems  Constitutional: Negative for chills, diaphoresis, fatigue and fever.  HENT: Negative.  Negative for facial swelling, sore throat and trouble swallowing.   Eyes: Negative.   Respiratory: Positive for cough. Negative for chest tightness, shortness of breath and wheezing.   Cardiovascular: Negative for chest pain, palpitations and leg swelling.  Gastrointestinal: Negative for abdominal pain, diarrhea, nausea and vomiting.  Endocrine: Negative.   Genitourinary: Negative.   Musculoskeletal: Positive for back pain.  Skin: Positive for rash.  Allergic/Immunologic: Negative.   Neurological: Negative.  Negative for dizziness.  Hematological: Negative for adenopathy. Does not bruise/bleed easily.  Psychiatric/Behavioral: Negative for decreased concentration and sleep disturbance. The patient is nervous/anxious.     Objective:  BP 132/70 (BP Location: Left Arm, Patient Position: Sitting, Cuff Size: Large)   Pulse 67   Temp 97.9 F (36.6 C) (Oral)   Ht 5' 3" (1.6 m)   Wt 172 lb 4 oz (78.1 kg)   SpO2 96%   BMI 30.51 kg/m   BP Readings from Last 3 Encounters:  07/10/17 132/70  07/06/17 (!) 143/63  06/23/17 120/70    Wt Readings from Last 3 Encounters:  07/10/17 172 lb 4 oz (78.1 kg)  07/06/17 173 lb (78.5 kg)  06/23/17 173 lb (  78.5 kg)    Physical Exam  Constitutional: She is oriented to person, place, and time. No distress.  HENT:  Mouth/Throat: Oropharynx is clear and moist. No oropharyngeal exudate.  Eyes: Conjunctivae are normal. Left eye exhibits no discharge. No scleral icterus.  Neck: Normal range of motion. Neck supple. No JVD present. No thyromegaly present.  Cardiovascular: Normal rate, regular rhythm and normal heart sounds. Exam reveals no gallop.  No murmur heard. Pulmonary/Chest: Effort normal and breath sounds normal. No respiratory distress. She has no wheezes. She has no rales.    Abdominal: Soft. Bowel sounds are normal. She exhibits no mass. There is no tenderness.  Musculoskeletal: Normal range of motion. She exhibits no edema, tenderness or deformity.  Lymphadenopathy:    She has no cervical adenopathy.  Neurological: She is alert and oriented to person, place, and time.  Skin: Skin is warm and dry. Rash noted. She is not diaphoretic. No erythema. No pallor.     Vitals reviewed.   Lab Results  Component Value Date   WBC 4.6 07/06/2017   HGB 15.3 (H) 07/06/2017   HCT 46.6 (H) 07/06/2017   PLT 127 (L) 07/06/2017   GLUCOSE 113 (H) 07/06/2017   CHOL 215 (H) 03/01/2017   TRIG 160 (H) 03/01/2017   HDL 28 (L) 03/01/2017   LDLDIRECT 159.0 12/02/2014   LDLCALC 155 (H) 03/01/2017   ALT 23 02/28/2017   AST 30 02/28/2017   NA 139 07/06/2017   K 4.1 07/06/2017   CL 106 07/06/2017   CREATININE 0.84 07/06/2017   BUN 14 07/06/2017   CO2 23 07/06/2017   TSH 3.65 12/13/2016   INR 0.98 02/28/2017   HGBA1C 4.9 03/01/2017    Dg Chest 2 View  Result Date: 07/06/2017 CLINICAL DATA:  Acute right-sided chest pain. EXAM: CHEST  2 VIEW COMPARISON:  06/21/2016. FINDINGS: Heart size is normal. There is aortic atherosclerosis. The lungs are clear except for minimal linear scarring in the mid portions on each side. Evidence of active infiltrate, mass, effusion or collapse. Ordinary degenerative changes affect the spine. IMPRESSION: No active disease.  Linear scarring on each side. Aortic atherosclerosis. Electronically Signed   By: Mark  Shogry M.D.   On: 07/06/2017 10:25   Ct Angio Chest Aorta W And/or Wo Contrast  Result Date: 07/06/2017 CLINICAL DATA:  Chest pain, shortness of breath. EXAM: CT ANGIOGRAPHY CHEST WITH CONTRAST TECHNIQUE: Multidetector CT imaging of the chest was performed using the standard protocol during bolus administration of intravenous contrast. Multiplanar CT image reconstructions and MIPs were obtained to evaluate the vascular anatomy. CONTRAST:   100mL ISOVUE-370 IOPAMIDOL (ISOVUE-370) INJECTION 76% COMPARISON:  Radiographs of same day. FINDINGS: Cardiovascular: Preferential opacification of the thoracic aorta. No evidence of thoracic aortic aneurysm or dissection. Normal heart size. No pericardial effusion. Mediastinum/Nodes: No enlarged mediastinal, hilar, or axillary lymph nodes. Thyroid gland, trachea, and esophagus demonstrate no significant findings. Lungs/Pleura: Left lung is clear. Minimal inflammatory changes or scarring is seen in right middle lobe. No pleural effusion or pneumothorax. Upper Abdomen: No acute abnormality. Musculoskeletal: No chest wall abnormality. No acute or significant osseous findings. Review of the MIP images confirms the above findings. IMPRESSION: Minimal inflammatory changes or scarring is seen in right middle lobe. No other significant abnormality seen in the chest. Electronically Signed   By: James  Green Jr, M.D.   On: 07/06/2017 13:28    Assessment & Plan:   Salihah was seen today for rash and cough.  Diagnoses and all orders for this   visit:  Pneumonia of right middle lobe due to infectious organism [facility])- Based on her symptoms, exam, and chest x-ray this has resolved. -     DG Chest 2 View; Future  Herpes zoster with complication- She has had the infection for a week so I do not think she would benefit from a course of steroids.  She will not take opiates for pain so will try Lyrica for pain relief.  Will treat the infection with valacyclovir. -     valACYclovir (VALTREX) 1000 MG tablet; Take 1 tablet (1,000 mg total) by mouth 2 (two) times daily for 7 days. -     Pregabalin ER (LYRICA CR) 165 MG TB24; Take 1 tablet by mouth at bedtime.   I have discontinued Theona E. Lynam's azithromycin, doxycycline, and cyclobenzaprine. I am also having her start on valACYclovir and Pregabalin ER. Additionally, I am having her maintain her potassium chloride SA, brimonidine, dorzolamide, Cyanocobalamin, aspirin,  furosemide, and ALPRAZolam.  Meds ordered this encounter  Medications  . valACYclovir (VALTREX) 1000 MG tablet    Sig: Take 1 tablet (1,000 mg total) by mouth 2 (two) times daily for 7 days.    Dispense:  14 tablet    Refill:  0  . Pregabalin ER (LYRICA CR) 165 MG TB24    Sig: Take 1 tablet by mouth at bedtime.    Dispense:  42 tablet    Refill:  0     Follow-up: Return in about 4 weeks (around 08/07/2017).  Scarlette Calico, MD

## 2017-07-10 NOTE — Patient Instructions (Signed)
Shingles Shingles, which is also known as herpes zoster, is an infection that causes a painful skin rash and fluid-filled blisters. Shingles is not related to genital herpes, which is a sexually transmitted infection. Shingles only develops in people who:  Have had chickenpox.  Have received the chickenpox vaccine. (This is rare.)  What are the causes? Shingles is caused by varicella-zoster virus (VZV). This is the same virus that causes chickenpox. After exposure to VZV, the virus stays in the body in an inactive (dormant) state. Shingles develops if the virus reactivates. This can happen many years after the initial exposure to VZV. It is not known what causes this virus to reactivate. What increases the risk? People who have had chickenpox or received the chickenpox vaccine are at risk for shingles. Infection is more common in people who:  Are older than age 50.  Have a weakened defense (immune) system, such as those with HIV, AIDS, or cancer.  Are taking medicines that weaken the immune system, such as transplant medicines.  Are under great stress.  What are the signs or symptoms? Early symptoms of this condition include itching, tingling, and pain in an area on your skin. Pain may be described as burning, stabbing, or throbbing. A few days or weeks after symptoms start, a painful red rash appears, usually on one side of the body in a bandlike or beltlike pattern. The rash eventually turns into fluid-filled blisters that break open, scab over, and dry up in about 2-3 weeks. At any time during the infection, you may also develop:  A fever.  Chills.  A headache.  An upset stomach.  How is this diagnosed? This condition is diagnosed with a skin exam. Sometimes, skin or fluid samples are taken from the blisters before a diagnosis is made. These samples are examined under a microscope or sent to a lab for testing. How is this treated? There is no specific cure for this condition.  Your health care provider will probably prescribe medicines to help you manage pain, recover more quickly, and avoid long-term problems. Medicines may include:  Antiviral drugs.  Anti-inflammatory drugs.  Pain medicines.  If the area involved is on your face, you may be referred to a specialist, such as an eye doctor (ophthalmologist) or an ear, nose, and throat (ENT) doctor to help you avoid eye problems, chronic pain, or disability. Follow these instructions at home: Medicines  Take medicines only as directed by your health care provider.  Apply an anti-itch or numbing cream to the affected area as directed by your health care provider. Blister and Rash Care  Take a cool bath or apply cool compresses to the area of the rash or blisters as directed by your health care provider. This may help with pain and itching.  Keep your rash covered with a loose bandage (dressing). Wear loose-fitting clothing to help ease the pain of material rubbing against the rash.  Keep your rash and blisters clean with mild soap and cool water or as directed by your health care provider.  Check your rash every day for signs of infection. These include redness, swelling, and pain that lasts or increases.  Do not pick your blisters.  Do not scratch your rash. General instructions  Rest as directed by your health care provider.  Keep all follow-up visits as directed by your health care provider. This is important.  Until your blisters scab over, your infection can cause chickenpox in people who have never had it or been vaccinated   against it. To prevent this from happening, avoid contact with other people, especially: ? Babies. ? Pregnant women. ? Children who have eczema. ? Elderly people who have transplants. ? People who have chronic illnesses, such as leukemia or AIDS. Contact a health care provider if:  Your pain is not relieved with prescribed medicines.  Your pain does not get better after  the rash heals.  Your rash looks infected. Signs of infection include redness, swelling, and pain that lasts or increases. Get help right away if:  The rash is on your face or nose.  You have facial pain, pain around your eye area, or loss of feeling on one side of your face.  You have ear pain or you have ringing in your ear.  You have loss of taste.  Your condition gets worse. This information is not intended to replace advice given to you by your health care provider. Make sure you discuss any questions you have with your health care provider. Document Released: 06/06/2005 Document Revised: 01/31/2016 Document Reviewed: 04/17/2014 Elsevier Interactive Patient Education  2018 [employer]  

## 2017-10-03 ENCOUNTER — Ambulatory Visit: Payer: Self-pay

## 2017-10-03 NOTE — Telephone Encounter (Signed)
Patient called in with c/o "stepping on a screw."   She says "I stepped on it yesterday around 4 pm, it turned and went in on the inside of my left heel. It bled quite a bit, I washed it and applied triple antibiotic ointment. I don't know when I had my last tetanus shot. This morning it is still oozing brownish looking drainage, just a little." I asked is the area red, she says "no." I asked about pain, she says "it's sore, no really painful."  According to protocol, see PCP within 24 hours, no availability with PCP, appointment scheduled for tomorrow at 1100 with Jodi Mourning, NP, care advice given, patient verbalized understanding.   Reason for Disposition . [1] Last tetanus shot > 5 years ago AND [2] DIRTY puncture (e.g., object OR skin was dirty, objects on ground/floor)  Answer Assessment - Initial Assessment Questions 1. LOCATION: "Where is the puncture located?"      Inside left heel 2. OBJECT: "What was the object that punctured the skin?"      Screw 3. DEPTH: "How deep do you think the puncture goes?"      Pretty deep, it bled quite a bit 4. ONSET: "When did the injury occur?" (Minutes or hours)     Yesterday about 4 pm 5. PAIN: "Is it painful?" If so, ask: "How bad is the pain?"  (Scale 1-10; or mild, moderate, severe)     Sore, not painful 6. TETANUS: "When was the last tetanus booster?"     Unknown 7. PREGNANCY: "Is there any chance you are pregnant?" "When was your last menstrual period?"     No  Protocols used: [facility]

## 2017-10-04 ENCOUNTER — Other Ambulatory Visit (INDEPENDENT_AMBULATORY_CARE_PROVIDER_SITE_OTHER): Payer: Medicare Other

## 2017-10-04 ENCOUNTER — Ambulatory Visit: Payer: Medicare Other | Admitting: Family

## 2017-10-04 ENCOUNTER — Encounter: Payer: Self-pay | Admitting: Family

## 2017-10-04 VITALS — BP 130/78 | HR 62 | Temp 97.7°F | Ht 63.0 in | Wt 174.1 lb

## 2017-10-04 DIAGNOSIS — E538 Deficiency of other specified B group vitamins: Secondary | ICD-10-CM

## 2017-10-04 DIAGNOSIS — T148XXA Other injury of unspecified body region, initial encounter: Secondary | ICD-10-CM

## 2017-10-04 DIAGNOSIS — Z23 Encounter for immunization: Secondary | ICD-10-CM

## 2017-10-04 LAB — VITAMIN B12: Vitamin B-12: 291 pg/mL (ref 211–911)

## 2017-10-04 MED ORDER — CYANOCOBALAMIN 1000 MCG/ML IJ SOLN: 1000.0000 ug | Freq: Once | INTRAMUSCULAR | Status: DC

## 2017-10-04 MED ORDER — CYANOCOBALAMIN 1000 MCG/ML IJ SOLN
1000.0000 ug | Freq: Once | INTRAMUSCULAR | Status: AC
  Administered 2017-10-04: 1000 ug via INTRAMUSCULAR

## 2017-10-04 NOTE — Progress Notes (Signed)
[name] is a 77 y.o. female with the following history as recorded in EpicCare:  Patient Active Problem List   Diagnosis Date Noted  . Pneumonia of right middle lobe due to infectious organism ([place]) 07/10/2017  . Herpes zoster with complication 28/78/6767  . Reactive airway disease 06/23/2017  . TIA (transient ischemic attack) 02/28/2017  . Depression with somatization 12/13/2016  . Allergic rhinitis due to pollen 06/21/2016  . Simple chronic bronchitis ([place]) 06/21/2016  . Routine general medical examination at a health care facility 11/29/2015  . GAD (generalized anxiety disorder) 12/02/2014  . IBS (irritable bowel syndrome) 01/20/2014  . B12 deficiency anemia 07/10/2013  . Unspecified vitamin D deficiency 01/04/2013  . HTN (hypertension) 07/26/2011  . CAD (coronary artery disease) 07/26/2011  . FATTY LIVER DISEASE 06/20/2009  . OBESITY 09/08/2007  . OSTEOPENIA 09/07/2007  . Hyperlipidemia with target LDL less than 100 07/30/2007    Current Outpatient Medications  Medication Sig Dispense Refill  . ALPRAZolam (XANAX) 0.5 MG tablet TAKE 1 TABLET BY MOUTH THREE TIMES DAILY AS NEEDED (Patient taking differently: 0.5MG  BY MOUTH THREE TIMES DAILY AS NEEDED FOR ANXIETY) 90 tablet 2  . aspirin EC 81 MG EC tablet Take 1 tablet (81 mg total) by mouth daily. 30 tablet 0  . brimonidine (ALPHAGAN) 0.15 % ophthalmic solution Place 1 drop into both eyes 3 (three) times daily.  3  . dorzolamide (TRUSOPT) 2 % ophthalmic solution Place 1 drop into both eyes 3 (three) times daily.  1  . furosemide (LASIX) 20 MG tablet Take 1 tablet (20 mg total) by mouth daily. 90 tablet 3  . potassium chloride SA (K-DUR,KLOR-CON) 20 MEQ tablet Take 1 tablet (20 mEq total) by mouth 2 (two) times daily. 180 tablet 1  . Pregabalin ER (LYRICA CR) 165 MG TB24 Take 1 tablet by mouth at bedtime. 42 tablet 0   Current Facility-Administered Medications  Medication Dose Route Frequency Provider Last Rate Last Dose  .  cyanocobalamin ((VITAMIN B-12)) injection 1,000 mcg  1,000 mcg Intramuscular Once Marrian Salvage, FNP        Allergies: Diltiazem hcl; Statins; Zetia [ezetimibe]; Codeine; Sulfonamide derivatives; Tape; and Tramadol  Past Medical History:  Diagnosis Date  . Anxiety state, unspecified   . CAD (coronary artery disease)   . Disorder of bone and cartilage, unspecified   . Diverticulosis of colon (without mention of hemorrhage)   . Dizziness and giddiness   . Fatty liver   . Fibromyalgia   . Obesity, unspecified   . Other and unspecified hyperlipidemia   . Other chronic nonalcoholic liver disease   . Skin cancer   . TIA (transient ischemic attack)   . Unspecified essential hypertension     Past Surgical History:  Procedure Laterality Date  . BACK SURGERY    . CHOLECYSTECTOMY    . MOHS SURGERY    . TONSILLECTOMY AND ADENOIDECTOMY    . TOTAL ABDOMINAL HYSTERECTOMY W/ BILATERAL SALPINGOOPHORECTOMY      Family History  Problem Relation Age of Onset  . Stomach cancer Mother   . Cancer Brother   . Breast cancer Sister   . Breast cancer Sister     Social History   Tobacco Use  . Smoking status: Never Smoker  . Smokeless tobacco: Never Used  Substance Use Topics  . Alcohol use: No    Subjective:  Patient was shopping at a store yesterday and accidentally stepped on a screw in the store; was able to immediately clean the  area and cover with Triple Antibiotic ointment; notes that today she is having very minimal pain; questioning if she needs to get her Tdap updated; not sure of last date of shot;  Also requesting to get a B12 injection done; diagnosed history of B12 deficiency;   Objective:  Vitals:   10/04/17 1114  BP: 130/78  Pulse: 62  Temp: 97.7 F (36.5 C)  TempSrc: Oral  SpO2: 97%  Weight: 174 lb 1.9 oz (79 kg)  Height: 5\' 3"  (1.6 m)    General: Well developed, well nourished, in no acute distress  Skin : Warm and dry. Small laceration noted on left heal  with no signs of infection Head: Normocephalic and atraumatic  Lungs: Respirations unlabored; clear to auscultation bilaterally without wheeze, rales, rhonchi  Neurologic: Alert and oriented; speech intact; face symmetrical; moves all extremities well; CNII-XII intact without focal deficit   Assessment:  1. Puncture wound   2. B12 deficiency     Plan:  1. Wound has no signs of infection; patient to keep clean/ covered for the next few days; feel she can get pedicure by this weekend; Tdap updated;  2. B12 level updated- last one on file is from 2015; B12 updated;   No follow-ups on file.  Orders Placed This Encounter  Procedures  . Tdap vaccine greater than or equal to 7yo IM  . B12    Standing Status:   Future    Number of Occurrences:   1    Standing Expiration Date:   10/04/2018    Requested Prescriptions    No prescriptions requested or ordered in this encounter

## 2017-10-16 ENCOUNTER — Other Ambulatory Visit: Payer: Self-pay | Admitting: Internal Medicine

## 2017-10-16 DIAGNOSIS — I10 Essential (primary) hypertension: Secondary | ICD-10-CM

## 2017-11-02 ENCOUNTER — Encounter: Payer: Self-pay | Admitting: Gastroenterology

## 2017-11-15 ENCOUNTER — Telehealth: Payer: Self-pay | Admitting: Internal Medicine

## 2017-11-15 NOTE — Telephone Encounter (Signed)
>>   Nov 15, 2017 12:47 PM Oneta Rack wrote: Osvaldo Human name: Bing Plume ( reflected on DPR) Relation to pt: daughter  Call back [facility]: [place], [place] - [place] [phone] (Phone) [phone] (Fax)  Reason for call:  Daughter requesting depression / anxiety Rx for patient due to patient spouse health condition declining rapidly, please advise

## 2017-11-15 NOTE — Telephone Encounter (Signed)
Called dtr and informed pt will need an appt to rx a depression/anxiety medication. Pt dtr stated understanding and have pt scheduled to see Mickel Baas tomorrow.

## 2017-11-16 ENCOUNTER — Ambulatory Visit: Payer: Medicare Other | Admitting: Family

## 2017-11-16 ENCOUNTER — Encounter: Payer: Self-pay | Admitting: Family

## 2017-11-16 VITALS — BP 138/90 | HR 67 | Temp 97.8°F | Ht 63.0 in | Wt 171.0 lb

## 2017-11-16 DIAGNOSIS — D510 Vitamin B12 deficiency anemia due to intrinsic factor deficiency: Secondary | ICD-10-CM | POA: Diagnosis not present

## 2017-11-16 DIAGNOSIS — F4323 Adjustment disorder with mixed anxiety and depressed mood: Secondary | ICD-10-CM

## 2017-11-16 MED ORDER — ESCITALOPRAM OXALATE 10 MG PO TABS: 10.0000 mg | ORAL_TABLET | Freq: Every day | ORAL | 1 refills | Status: DC

## 2017-11-16 MED ORDER — CYANOCOBALAMIN 1000 MCG/ML IJ SOLN
1000.0000 ug | Freq: Once | INTRAMUSCULAR | Status: AC
  Administered 2017-11-16: 1000 ug via INTRAMUSCULAR

## 2017-11-16 NOTE — Progress Notes (Addendum)
[name] is a 77 y.o. female with the following history as recorded in EpicCare:  Patient Active Problem List   Diagnosis Date Noted  . Pneumonia of right middle lobe due to infectious organism ([place]) 07/10/2017  . Herpes zoster with complication 24/58/0998  . Reactive airway disease 06/23/2017  . TIA (transient ischemic attack) 02/28/2017  . Depression with somatization 12/13/2016  . Allergic rhinitis due to pollen 06/21/2016  . Simple chronic bronchitis ([place]) 06/21/2016  . Routine general medical examination at a health care facility 11/29/2015  . GAD (generalized anxiety disorder) 12/02/2014  . IBS (irritable bowel syndrome) 01/20/2014  . B12 deficiency anemia 07/10/2013  . Unspecified vitamin D deficiency 01/04/2013  . HTN (hypertension) 07/26/2011  . CAD (coronary artery disease) 07/26/2011  . FATTY LIVER DISEASE 06/20/2009  . OBESITY 09/08/2007  . OSTEOPENIA 09/07/2007  . Hyperlipidemia with target LDL less than 100 07/30/2007    Current Outpatient Medications  Medication Sig Dispense Refill  . ALPRAZolam (XANAX) 0.5 MG tablet TAKE 1 TABLET BY MOUTH THREE TIMES DAILY AS NEEDED (Patient taking differently: 0.5MG  BY MOUTH THREE TIMES DAILY AS NEEDED FOR ANXIETY) 90 tablet 2  . aspirin EC 81 MG EC tablet Take 1 tablet (81 mg total) by mouth daily. 30 tablet 0  . brimonidine (ALPHAGAN) 0.15 % ophthalmic solution Place 1 drop into both eyes 3 (three) times daily.  3  . dorzolamide (TRUSOPT) 2 % ophthalmic solution Place 1 drop into both eyes 3 (three) times daily.  1  . furosemide (LASIX) 20 MG tablet Take 1 tablet (20 mg total) by mouth daily. 90 tablet 3  . potassium chloride SA (K-DUR,KLOR-CON) 20 MEQ tablet Take 1 tablet (20 mEq total) by mouth 2 (two) times daily. 180 tablet 1  . escitalopram (LEXAPRO) 10 MG tablet Take 1 tablet (10 mg total) by mouth daily. 30 tablet 1   Current Facility-Administered Medications  Medication Dose Route Frequency Provider Last Rate Last  Dose  . cyanocobalamin ((VITAMIN B-12)) injection 1,000 mcg  1,000 mcg Intramuscular Once Marrian Salvage, FNP        Allergies: Diltiazem hcl; Statins; Zetia [ezetimibe]; Codeine; Sulfonamide derivatives; Tape; and Tramadol  Past Medical History:  Diagnosis Date  . Anxiety state, unspecified   . CAD (coronary artery disease)   . Disorder of bone and cartilage, unspecified   . Diverticulosis of colon (without mention of hemorrhage)   . Dizziness and giddiness   . Fatty liver   . Fibromyalgia   . Obesity, unspecified   . Other and unspecified hyperlipidemia   . Other chronic nonalcoholic liver disease   . Skin cancer   . TIA (transient ischemic attack)   . Unspecified essential hypertension     Past Surgical History:  Procedure Laterality Date  . BACK SURGERY    . CHOLECYSTECTOMY    . MOHS SURGERY    . TONSILLECTOMY AND ADENOIDECTOMY    . TOTAL ABDOMINAL HYSTERECTOMY W/ BILATERAL SALPINGOOPHORECTOMY      Family History  Problem Relation Age of Onset  . Stomach cancer Mother   . Cancer Brother   . Breast cancer Sister   . Breast cancer Sister     Social History   Tobacco Use  . Smoking status: Never Smoker  . Smokeless tobacco: Never Used  Substance Use Topics  . Alcohol use: No    Subjective:  Patient presents with concerns for increased anxiety. Her husband was placed on Hospice for metastatic cancer yesterday; she is the primary caregiver  for her husband. Does have prescription for Xanax but only uses at night to help with insomnia; prefers not to take the Xanax during the day; in reviewing previous notes, has discussed anti-depressant with her PCP previously but she declined at that time; does feel that she has good support from her children who all live close by; married for 83 years; husband has asked to be allowed to pass away naturally; she wants to follow his wishes but admits she is scared, worried about losing him/ what life will be like for her after he is  gone; will be meeting with hospice social worker/ nurse later this afternoon to discuss available services.   Would also like to get B12 injection updated today;  Objective:  Vitals:   11/16/17 1321  BP: 138/90  Pulse: 67  Temp: 97.8 F (36.6 C)  TempSrc: Oral  SpO2: 97%  Weight: 171 lb 0.6 oz (77.6 kg)  Height: 5\' 3"  (1.6 m)    General: Well developed, well nourished, in no acute distress;; tearful in office  Skin : Warm and dry.  Head: Normocephalic and atraumatic  Lungs: Respirations unlabored; clear to auscultation bilaterally without wheeze, rales, rhonchi  Neurologic: Alert and oriented; speech intact; face symmetrical; moves all extremities well; CNII-XII intact without focal deficit   Limited physical exam as majority of visit spent in counseling Assessment:  1. Situational mixed anxiety and depressive disorder   2. Vitamin B12 deficiency anemia due to intrinsic factor deficiency     Plan:  Spent 30 minutes with patient; greater than 50% spent in counseling; discussed role and benefit of Hospice for both her husband and herself; encouraged her to ask for help from her children; will start Lexapro 10 mg- risks and benefits discussed; continue to use Xanax at night- reassurance she can use during the day if feels she needs help with her anxiety; follow-up in 1 month/ call with response if unable to make it to office visit.    No follow-ups on file.  No orders of the defined types were placed in this encounter.   Requested Prescriptions   Signed Prescriptions Disp Refills  . escitalopram (LEXAPRO) 10 MG tablet 30 tablet 1    Sig: Take 1 tablet (10 mg total) by mouth daily.

## 2017-11-22 ENCOUNTER — Telehealth: Payer: Self-pay

## 2017-11-22 NOTE — Telephone Encounter (Signed)
(  FYI)  Copied from [facility] [id number]. Topic: General - Other >> Nov 22, 2017  3:44 PM Mcneil, Ja-Kwan wrote: Reason for CRM: Pt states she just wants to make the doctor aware that the Rx for escitalopram (LEXAPRO) 10 MG tablet is working.

## 2017-11-30 ENCOUNTER — Encounter: Payer: Self-pay | Admitting: Family Medicine

## 2017-11-30 ENCOUNTER — Ambulatory Visit: Payer: Self-pay | Admitting: *Deleted

## 2017-11-30 ENCOUNTER — Ambulatory Visit: Payer: Medicare Other | Admitting: Family Medicine

## 2017-11-30 ENCOUNTER — Other Ambulatory Visit (INDEPENDENT_AMBULATORY_CARE_PROVIDER_SITE_OTHER): Payer: Medicare Other

## 2017-11-30 VITALS — BP 122/64 | HR 63 | Temp 98.1°F | Ht 63.0 in | Wt 174.0 lb

## 2017-11-30 DIAGNOSIS — R1030 Lower abdominal pain, unspecified: Secondary | ICD-10-CM

## 2017-11-30 LAB — COMPREHENSIVE METABOLIC PANEL
ALT: 14 U/L (ref 0–35)
AST: 19 U/L (ref 0–37)
Albumin: 4.2 g/dL (ref 3.5–5.2)
Alkaline Phosphatase: 140 U/L — ABNORMAL HIGH (ref 39–117)
BILIRUBIN TOTAL: 0.8 mg/dL (ref 0.2–1.2)
BUN: 15 mg/dL (ref 6–23)
CO2: 29 meq/L (ref 19–32)
Calcium: 9.4 mg/dL (ref 8.4–10.5)
Chloride: 105 mEq/L (ref 96–112)
Creatinine, Ser: 0.8 mg/dL (ref 0.40–1.20)
GFR: 73.96 mL/min (ref >60.00)
GLUCOSE: 98 mg/dL (ref 70–99)
Potassium: 4.3 mEq/L (ref 3.5–5.1)
Sodium: 140 mEq/L (ref 135–145)
Total Protein: 6.9 g/dL (ref 6.0–8.3)

## 2017-11-30 LAB — CBC WITH DIFFERENTIAL/PLATELET
BASOS ABS: 0.1 10*3/uL (ref 0.0–0.1)
Basophils Relative: 0.9 % (ref 0.0–3.0)
EOS ABS: 0.1 10*3/uL (ref 0.0–0.7)
Eosinophils Relative: 1.2 % (ref 0.0–5.0)
HCT: 42.8 % (ref 36.0–46.0)
Hemoglobin: 14.7 g/dL (ref 12.0–15.0)
LYMPHS ABS: 0.6 10*3/uL — AB (ref 0.7–4.0)
Lymphocytes Relative: 10.6 % — ABNORMAL LOW (ref 12.0–46.0)
MCHC: 34.2 g/dL (ref 30.0–36.0)
MCV: 85.9 fl (ref 78.0–100.0)
Monocytes Absolute: 0.4 10*3/uL (ref 0.1–1.0)
Monocytes Relative: 6 % (ref 3.0–12.0)
NEUTROS ABS: 4.8 10*3/uL (ref 1.4–7.7)
NEUTROS PCT: 81.3 % — AB (ref 43.0–77.0)
PLATELETS: 145 10*3/uL — AB (ref 150.0–400.0)
RBC: 4.99 Mil/uL (ref 3.87–5.11)
RDW: 13.4 % (ref 11.5–15.5)
WBC: 5.9 10*3/uL (ref 4.0–10.5)

## 2017-11-30 MED ORDER — DICYCLOMINE HCL 10 MG PO CAPS: 10.0000 mg | ORAL_CAPSULE | Freq: Three times a day (TID) | ORAL | 0 refills | Status: DC

## 2017-11-30 NOTE — Patient Instructions (Signed)
We will call you with the results from today  Please let us know if your symptoms worsen to don't improve.

## 2017-11-30 NOTE — Telephone Encounter (Signed)
Pt having complaints of abdominal pain in the lower part of abdomen since last week. Pt is currently rating pain 6 and states that it is constant. Pt states she has tried taking Ibuprofen for pain but it did not help decrease pain. Pt has a history of IBS and states she is in between having diarrhea and constipation at this time. Pt states she experienced abdominal pain like this approximately 6 month ago due to IBS but noticed that it returned after starting Lexapro the 1st of this month. Pt denies any other symptoms at this time. PCP not available for appt for today and pt is unable to come in for appt on tomorrow. Appt scheduled with Dr. Raeford Razor on 6/13 at 3:20pm. Pt advised to return call to the office if symptoms become worse before seen for OV. Pt verbalized understanding.  Reason for Disposition . [1] MILD-MODERATE pain AND [2] constant AND [3] present > 2 hours  Answer Assessment - Initial Assessment Questions 1. LOCATION: "Where does it hurt?"      All over abdomen 2. RADIATION: "Does the pain shoot anywhere else?" (e.g., chest, back)     no 3. ONSET: "When did the pain begin?" (e.g., minutes, hours or days ago)      Last week 4. SUDDEN: "Gradual or sudden onset?"     gradual 5. PATTERN "Does the pain come and go, or is it constant?"    - If constant: "Is it getting better, staying the same, or worsening?"      (Note: Constant means the pain never goes away completely; most serious pain is constant and it progresses)     - If intermittent: "How long does it last?" "Do you have pain now?"     (Note: Intermittent means the pain goes away completely between bouts)     constant 6. SEVERITY: "How bad is the pain?"  (e.g., Scale 1-10; mild, moderate, or severe)   - MILD (1-3): doesn't interfere with normal activities, abdomen soft and not tender to touch    - MODERATE (4-7): interferes with normal activities or awakens from sleep, tender to touch    - SEVERE (8-10): excruciating pain,  doubled over, unable to do any normal activities     Moderate and is tender to tocuh 7. RECURRENT SYMPTOM: "Have you ever had this type of abdominal pain before?" If so, ask: "When was the last time?" and "What happened that time?"     Yes but approximately 6 months ago, but noticed it returned when Lexapro started 8. CAUSE: "What do you think is causing the abdominal pain?"     Recently started on Lexapro 9. RELIEVING/AGGRAVATING FACTORS: "What makes it better or worse?" (e.g., movement, antacids, bowel movement)     No 10. OTHER SYMPTOMS: "Has there been any vomiting, diarrhea, constipation, or urine problems?"      Has had diarrhea and constipation with a history of IBS  Protocols used: ABDOMINAL PAIN - [facility]

## 2017-11-30 NOTE — Progress Notes (Signed)
[name] - 77 y.o. female MRN [id number]  Date of birth: [birth year]  SUBJECTIVE:  Including CC & ROS.  Chief Complaint  Patient presents with  . Abdominal Pain    [name] is a 77 y.o. female that is here today for an evaluation of abdominal pain. Intermittent last week has been more constant for the past three days. Denies changes in her bowel movements. Denies nausea or vomiting. Denies fevers or chills. She started taking Lexapro on 11/18/17 was experiencing headaches which have subsided. She was has been recently stressed due to her husband's recent diagnosis of liver cancer.  Pain is located in her lower abdominal quadrant. Pain is described as an ache. She has been taking motrin. Denies changes in her health. Denies weight gain or loss. Stools have been non bloody.  Denies any dysuria.  She suffers from IBS.  Her most recent bowel movement was this morning.  She has stools that are not normal.  Denies any significant diarrhea.  Does have constipation from time to time.   Review of the CT abdomen pelvis from 2013 shows colonic diverticulosis and status post hysterectomy.   Review of Systems  Constitutional: Negative for fever.  HENT: Negative for congestion.   Eyes: Negative for pain.  Respiratory: Negative for cough.   Cardiovascular: Negative for chest pain.  Gastrointestinal: Positive for abdominal pain and constipation. Negative for abdominal distention, anal bleeding, blood in stool and diarrhea.  Musculoskeletal: Negative for gait problem.  Skin: Negative for color change.  Neurological: Negative for weakness.  Hematological: Negative for adenopathy.  Psychiatric/Behavioral: Negative for agitation.    HISTORY: Past Medical, Surgical, Social, and Family History Reviewed & Updated per EMR.   Pertinent Historical Findings include:  Past Medical History:  Diagnosis Date  . Anxiety state, unspecified   . CAD (coronary artery disease)   . Disorder of bone and  cartilage, unspecified   . Diverticulosis of colon (without mention of hemorrhage)   . Dizziness and giddiness   . Fatty liver   . Fibromyalgia   . Obesity, unspecified   . Other and unspecified hyperlipidemia   . Other chronic nonalcoholic liver disease   . Skin cancer   . TIA (transient ischemic attack)   . Unspecified essential hypertension     Past Surgical History:  Procedure Laterality Date  . BACK SURGERY    . CHOLECYSTECTOMY    . MOHS SURGERY    . TONSILLECTOMY AND ADENOIDECTOMY    . TOTAL ABDOMINAL HYSTERECTOMY W/ BILATERAL SALPINGOOPHORECTOMY      Allergies  Allergen Reactions  . Diltiazem Hcl Other (See Comments)    REACTION: pt states it made her dizzy...  . Statins     Muscle aches  . Zetia [Ezetimibe]     "it made me feel bad"  . Codeine Nausea Only  . Sulfonamide Derivatives Hives, Itching and Rash  . Tape Itching  . Tramadol Itching    Family History  Problem Relation Age of Onset  . Stomach cancer Mother   . Cancer Brother   . Breast cancer Sister   . Breast cancer Sister      Social History   Socioeconomic History  . Marital status: Married    Spouse name: Thompson Grayer. Arteaga Sr.  . Number of children: 4  . Years of education: Not on file  . Highest education level: Not on file  Occupational History    Employer: RETIRED  Social Needs  . Financial resource strain: Not on  file  . Food insecurity:    Worry: Not on file    Inability: Not on file  . Transportation needs:    Medical: Not on file    Non-medical: Not on file  Tobacco Use  . Smoking status: Never Smoker  . Smokeless tobacco: Never Used  Substance and Sexual Activity  . Alcohol use: No  . Drug use: No  . Sexual activity: Not Currently  Lifestyle  . Physical activity:    Days per week: Not on file    Minutes per session: Not on file  . Stress: Not on file  Relationships  . Social connections:    Talks on phone: Not on file    Gets together: Not on file    Attends  religious service: Not on file    Active member of club or organization: Not on file    Attends meetings of clubs or organizations: Not on file    Relationship status: Not on file  . Intimate partner violence:    Fear of current or ex partner: Not on file    Emotionally abused: Not on file    Physically abused: Not on file    Forced sexual activity: Not on file  Other Topics Concern  . Not on file  Social History Narrative  . Not on file     PHYSICAL EXAM:  VS: BP 122/64 (BP Location: Left Arm, Patient Position: Sitting, Cuff Size: Normal)   Pulse 63   Temp 98.1 F (36.7 C) (Oral)   Ht 5\' 3"  (1.6 m)   Wt 174 lb (78.9 kg)   SpO2 95%   BMI 30.82 kg/m  Physical Exam Gen: NAD, alert, cooperative with exam,  ENT: normal lips, normal nasal mucosa,  Eye: normal EOM, normal conjunctiva and lids CV:  no edema, +2 pedal pulses   Resp: no accessory muscle use, non-labored,  GI: no masses, tenderness in the lower quadrant, normal bowel sounds, soft, nondistended, no hernia  Skin: no rashes, no areas of induration  Neuro: normal tone, normal sensation to touch Psych:  normal insight, alert and oriented MSK: Normal gait, normal strength     ASSESSMENT & PLAN:   Lower abdominal pain Possible that her abdominal pain is related to constipation as she does have a history of IBS.  Previous imaging does show diverticulosis.  Less likely for diverticulitis with no blood in her stool as of yet. -CBC and CMP -Bentyl -If no improvement consider abdominal x-ray or CT.  Given indications to follow-up.

## 2017-11-30 NOTE — Assessment & Plan Note (Signed)
Possible that her abdominal pain is related to constipation as she does have a history of IBS.  Previous imaging does show diverticulosis.  Less likely for diverticulitis with no blood in her stool as of yet. -CBC and CMP -Bentyl -If no improvement consider abdominal x-ray or CT.  Given indications to follow-up.

## 2017-12-11 ENCOUNTER — Other Ambulatory Visit: Payer: Self-pay | Admitting: Internal Medicine

## 2017-12-11 DIAGNOSIS — F411 Generalized anxiety disorder: Secondary | ICD-10-CM

## 2018-01-03 IMAGING — DX DG OS CALCIS 2+V*R*
2 series · 2 of 2 positions shown · non-contrast
Comparison: None.

CLINICAL DATA: Swelling of the right heel with pain over the last 5
days, no injury

EXAM:
RIGHT OS CALCIS - 2+ VIEW

[calcaneus axial]
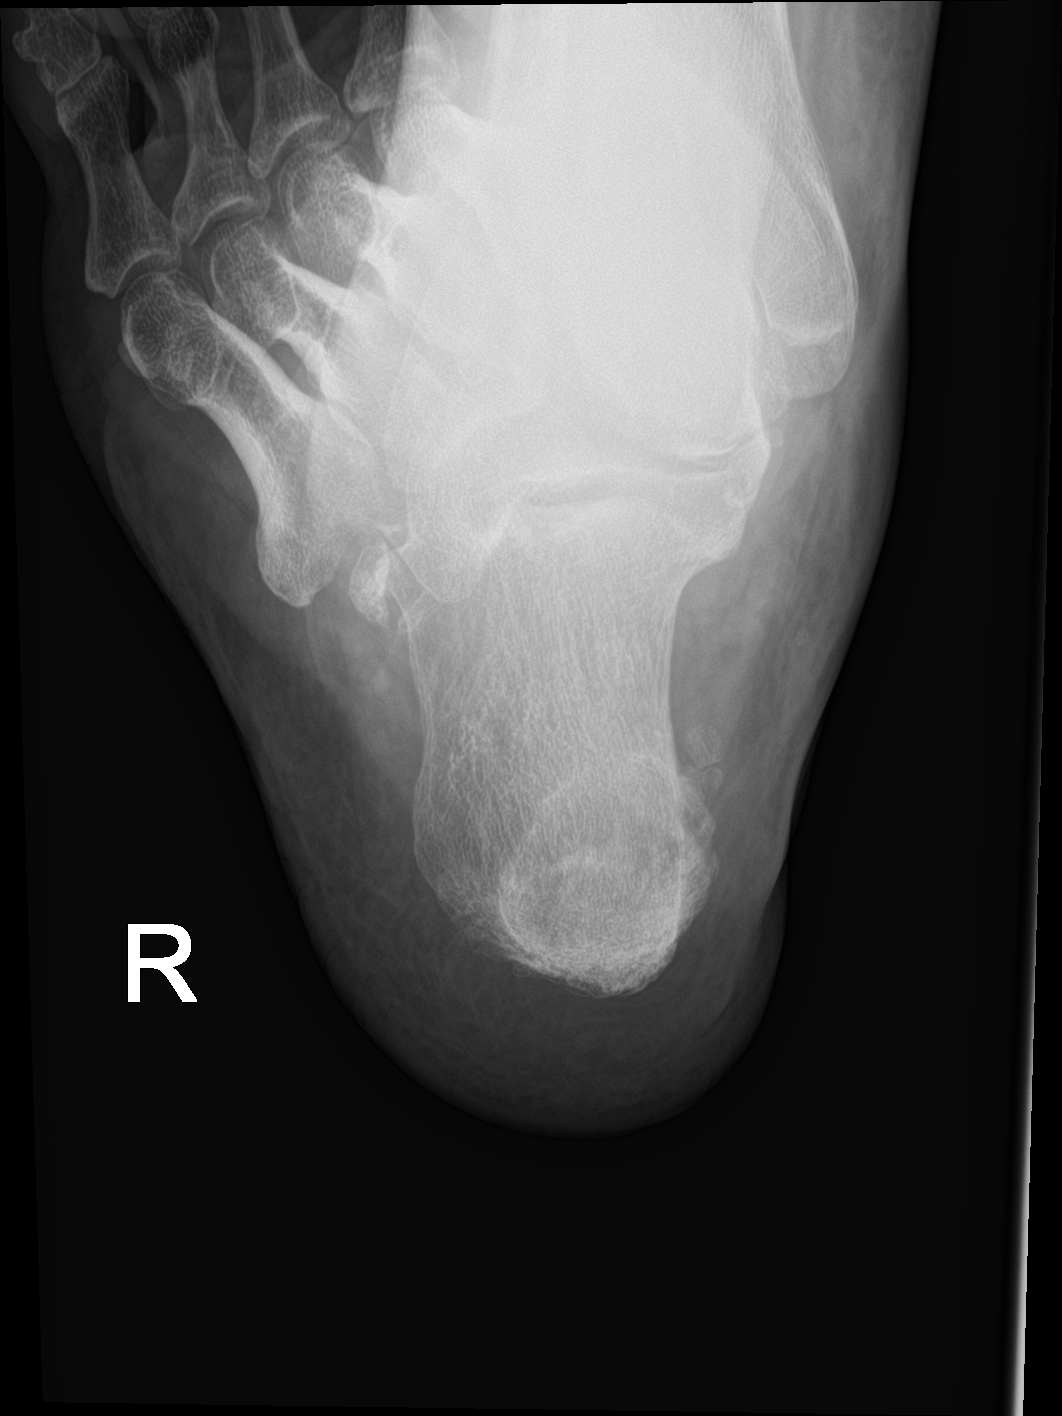

[calcaneus lat]
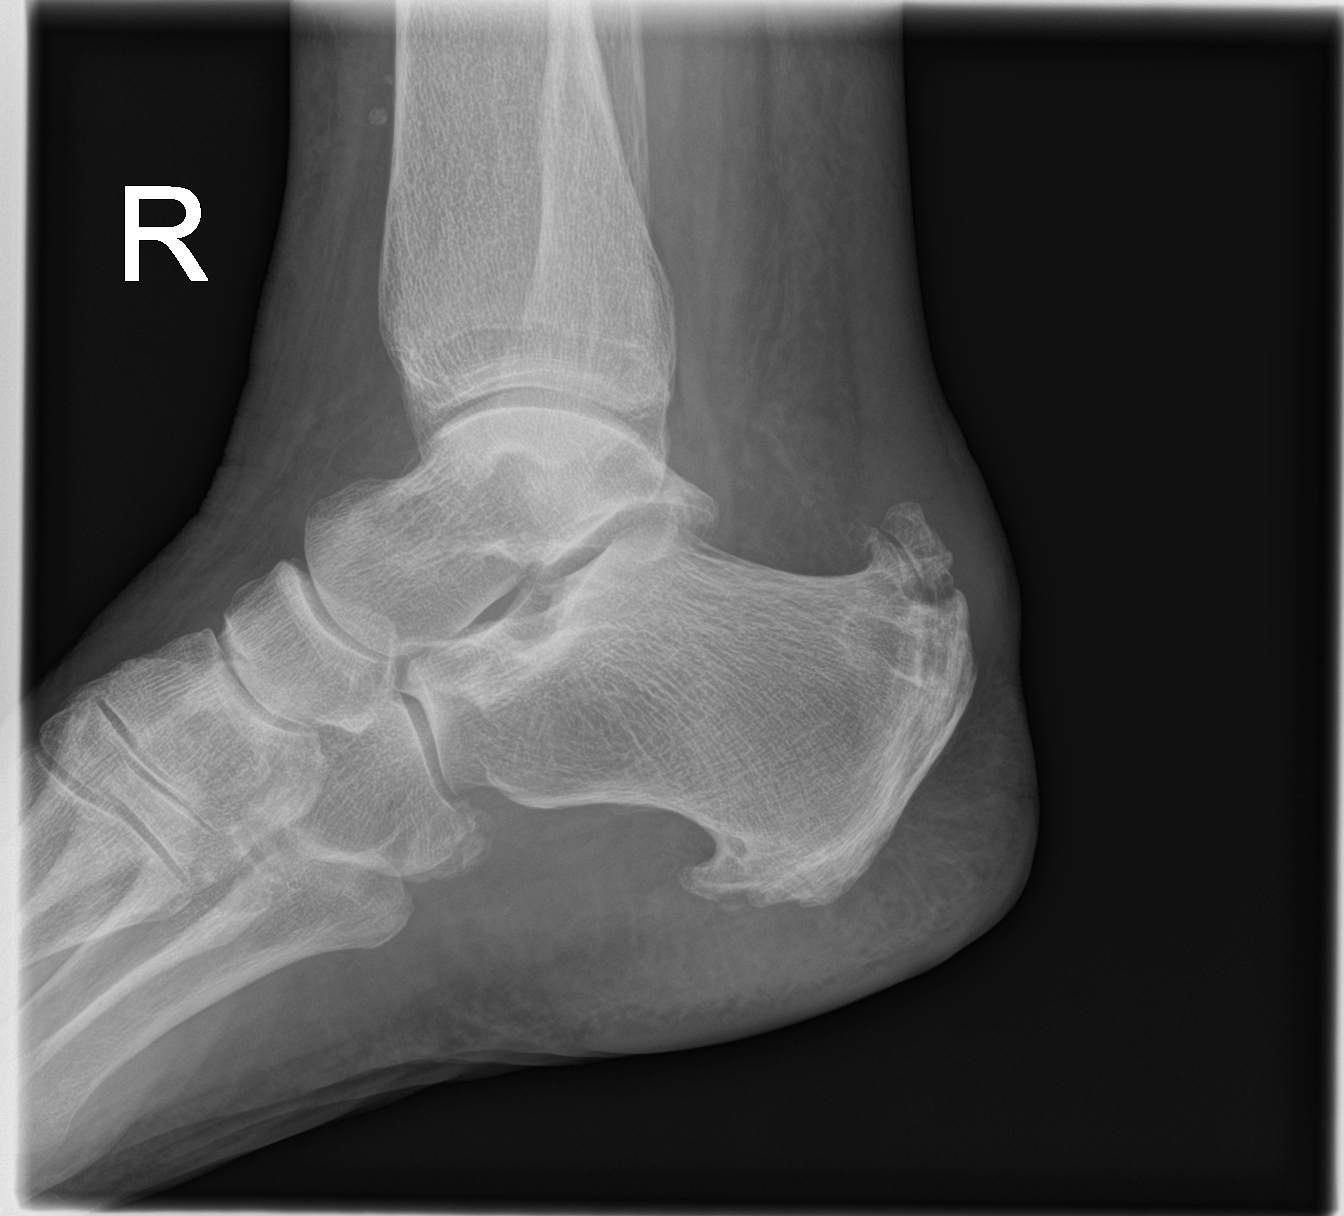

[2 of 2 positions shown; findings below may reference images not displayed]

FINDINGS: There are degenerative spurs emanating from the plantar aspect as
well as the insertion of the Achilles tendon. No acute fracture is
seen. Alignment is normal. The subtalar joint space is unremarkable.
IMPRESSION: Degenerative spurs from the insertion of the Achilles tendon and
from the plantar aspect of calcaneus. No acute abnormality.

## 2018-01-15 ENCOUNTER — Other Ambulatory Visit: Payer: Self-pay | Admitting: Family

## 2018-03-12 ENCOUNTER — Encounter: Payer: Self-pay | Admitting: Gastroenterology

## 2018-03-19 ENCOUNTER — Other Ambulatory Visit: Payer: Self-pay | Admitting: Family

## 2018-03-20 ENCOUNTER — Encounter: Payer: Medicare Other | Admitting: Gastroenterology

## 2018-04-10 ENCOUNTER — Encounter: Payer: Self-pay | Admitting: Internal Medicine

## 2018-04-10 ENCOUNTER — Ambulatory Visit: Payer: Medicare Other | Admitting: Internal Medicine

## 2018-04-10 VITALS — BP 130/64 | HR 71 | Temp 98.0°F | Ht 63.0 in | Wt 168.0 lb

## 2018-04-10 DIAGNOSIS — J410 Simple chronic bronchitis: Secondary | ICD-10-CM

## 2018-04-10 MED ORDER — PREDNISONE 20 MG PO TABS: 40.0000 mg | ORAL_TABLET | Freq: Every day | ORAL | 0 refills | Status: DC

## 2018-04-10 MED ORDER — METHYLPREDNISOLONE ACETATE 40 MG/ML IJ SUSP
40.0000 mg | Freq: Once | INTRAMUSCULAR | Status: AC
  Administered 2018-04-10: 40 mg via INTRAMUSCULAR

## 2018-04-10 MED ORDER — CETIRIZINE HCL 10 MG PO TABS: 10.0000 mg | ORAL_TABLET | Freq: Every day | ORAL | 11 refills | Status: DC

## 2018-04-10 NOTE — Patient Instructions (Signed)
We have sent in prednisone to take 2 pills daily for 5 days.  We have given you a shot today.  We have also sent in prescription for zyrtec (cetirizine) to take 1 pill daily for the next 1-2 weeks.

## 2018-04-10 NOTE — Progress Notes (Signed)
   Subjective:    Patient ID: [name], female    DOB: Feb 09, 1941, 77 y.o.   MRN: [id number]  HPI The patient is a 77 YO female coming in for cough and SOB. She started yesterday with some mild drainage, cough. Mostly dry cough. She denies chills but some fevers this morning. She is concerned that her new great grandchild is coming this Friday and she wants to be well for that. She denies taking anything for this. Not taking allergy medication recently. Does have some SOB with activity. She sometimes gets this and usually ends up having to take prednisone for it for breathing. Does not have albuterol inhaler at home. Overall worsening.   Review of Systems  Constitutional: Positive for activity change and fever. Negative for appetite change, chills, fatigue and unexpected weight change.  HENT: Positive for congestion, postnasal drip, rhinorrhea and sore throat. Negative for ear discharge, ear pain, sinus pressure, sinus pain, sneezing, tinnitus, trouble swallowing and voice change.   Eyes: Negative.   Respiratory: Positive for cough and shortness of breath. Negative for chest tightness and wheezing.   Cardiovascular: Negative.   Gastrointestinal: Negative.   Musculoskeletal: Negative for myalgias.  Neurological: Negative.  Negative for headaches.      Objective:   Physical Exam  Constitutional: She is oriented to person, place, and time. She appears well-developed and well-nourished.  HENT:  Head: Normocephalic and atraumatic.  Oropharynx with redness and clear drainage, nose with swollen turbinates, TMs normal bilaterally  Eyes: EOM are normal.  Neck: Normal range of motion. No thyromegaly present.  Cardiovascular: Normal rate and regular rhythm.  Pulmonary/Chest: Effort normal and breath sounds normal. No respiratory distress. She has no wheezes. She has no rales.  Mild wheezing or upper airway sounds right lung  Abdominal: Soft.  Musculoskeletal: She exhibits tenderness.    Lymphadenopathy:    She has no cervical adenopathy.  Neurological: She is alert and oriented to person, place, and time.  Skin: Skin is warm and dry.   Vitals:   04/10/18 1549  BP: 130/64  Pulse: 71  Temp: 98 F (36.7 C)  TempSrc: Oral  SpO2: 97%  Weight: 168 lb (76.2 kg)  Height: 5\' 3"  (1.6 m)      Assessment & Plan:  Depo-medrol 40 mg IM

## 2018-04-10 NOTE — Assessment & Plan Note (Signed)
Depo-medrol 40 mg IM given and prednisone rx sent in as well as zyrtec rx.

## 2018-04-13 ENCOUNTER — Ambulatory Visit: Payer: Medicare Other

## 2018-04-17 ENCOUNTER — Other Ambulatory Visit: Payer: Self-pay | Admitting: Internal Medicine

## 2018-04-17 ENCOUNTER — Other Ambulatory Visit: Payer: Self-pay | Admitting: Family

## 2018-04-20 ENCOUNTER — Telehealth: Payer: Self-pay | Admitting: Gastroenterology

## 2018-04-20 ENCOUNTER — Ambulatory Visit: Payer: Medicare Other

## 2018-04-20 ENCOUNTER — Ambulatory Visit (AMBULATORY_SURGERY_CENTER): Payer: Medicare Other

## 2018-04-20 ENCOUNTER — Encounter: Payer: Self-pay | Admitting: Gastroenterology

## 2018-04-20 VITALS — Ht 63.0 in | Wt 165.8 lb

## 2018-04-20 DIAGNOSIS — Z1211 Encounter for screening for malignant neoplasm of colon: Secondary | ICD-10-CM

## 2018-04-20 MED ORDER — NA SULFATE-K SULFATE-MG SULF 17.5-3.13-1.6 GM/177ML PO SOLN: 1.0000 | Freq: Once | ORAL | 0 refills | Status: AC

## 2018-04-20 NOTE — Telephone Encounter (Signed)
Called pt. Still no answer.

## 2018-04-20 NOTE — Telephone Encounter (Signed)
Called pt, no answer. Unable to leave message no voicemail message. Will try later.

## 2018-04-20 NOTE — Telephone Encounter (Signed)
Spoke with [name]. She states the Suprep will cost $99.99 and she needed another prep that was not expensive. Informed [name] to come to the office and we can give her a Suprep sample.  Suprep sample 1 kit lot #[id number],TKZ 08/21 will be left at the front desk on the 3rd floor.The [name] will come pick the sample up on Monday and call us if she has any questions. Gwyndolyn Saxon in [facility]

## 2018-04-20 NOTE — Progress Notes (Deleted)
Subjective:   [name] is a 77 y.o. female who presents for Medicare Annual (Subsequent) preventive examination.  Review of Systems:  No ROS.  Medicare Wellness Visit. Additional risk factors are reflected in the social history.    Sleep patterns: {SX; SLEEP PATTERNS:18802::"feels rested on waking","does not get up to void","gets up *** times nightly to void","sleeps *** hours nightly"}.    Home Safety/Smoke Alarms: Feels safe in home. Smoke alarms in place.  Living environment; residence and Firearm Safety: {Rehab home environment / accessibility:30080::"no firearms","firearms stored safely"}. Seat Belt Safety/Bike Helmet: Wears seat belt.      Objective:     Vitals: There were no vitals taken for this visit.  There is no height or weight on file to calculate BMI.  Advanced Directives 07/06/2017 04/11/2017 02/28/2017 02/28/2017 11/29/2015 09/05/2013 07/26/2011  Does Patient Have a Medical Advance Directive? No No Yes No Yes Patient does not have advance directive;Patient would not like information Patient does not have advance directive  Type of Advance Directive - - Living will - Healthcare Power of [place];Living will - -  Does patient want to make changes to medical advance directive? - Yes (ED - Information included in AVS) No - Patient declined - No - Patient declined - -  Copy of [place] in Chart? - - - - Yes - -  Would patient like information on creating a medical advance directive? Yes (ED - Information included in AVS) - - No - Patient declined - - -  Pre-existing out of facility DNR order (yellow form or pink MOST form) - - - - - - No    Tobacco Social History   Tobacco Use  Smoking Status Never Smoker  Smokeless Tobacco Never Used     Counseling given: Not Answered  Past Medical History:  Diagnosis Date  . Anxiety state, unspecified   . CAD (coronary artery disease)   . Disorder of bone and cartilage, unspecified   . Diverticulosis of  colon (without mention of hemorrhage)   . Dizziness and giddiness   . Fatty liver   . Fibromyalgia   . Obesity, unspecified   . Other and unspecified hyperlipidemia   . Other chronic nonalcoholic liver disease   . Skin cancer    basal cell on face  . Stroke [facility])    TIA  . TIA (transient ischemic attack)   . Unspecified essential hypertension    Past Surgical History:  Procedure Laterality Date  . BACK SURGERY    . CHOLECYSTECTOMY    . MOHS SURGERY    . TONSILLECTOMY AND ADENOIDECTOMY    . TOTAL ABDOMINAL HYSTERECTOMY W/ BILATERAL SALPINGOOPHORECTOMY     Family History  Problem Relation Age of Onset  . Stomach cancer Mother   . Cancer Brother   . Breast cancer Sister   . Other Father   . Breast cancer Sister    Social History   Socioeconomic History  . Marital status: Married    Spouse name: Thompson Grayer. Spranger Sr.  . Number of children: 4  . Years of education: Not on file  . Highest education level: Not on file  Occupational History    Employer: RETIRED  Social Needs  . Financial resource strain: Not on file  . Food insecurity:    Worry: Not on file    Inability: Not on file  . Transportation needs:    Medical: Not on file    Non-medical: Not on file  Tobacco Use  .  Smoking status: Never Smoker  . Smokeless tobacco: Never Used  Substance and Sexual Activity  . Alcohol use: No  . Drug use: No  . Sexual activity: Not Currently  Lifestyle  . Physical activity:    Days per week: Not on file    Minutes per session: Not on file  . Stress: Not on file  Relationships  . Social connections:    Talks on phone: Not on file    Gets together: Not on file    Attends religious service: Not on file    Active member of club or organization: Not on file    Attends meetings of clubs or organizations: Not on file    Relationship status: Not on file  Other Topics Concern  . Not on file  Social History Narrative  . Not on file    Outpatient Encounter Medications as of  04/20/2018  Medication Sig  . ALPRAZolam (XANAX) 0.5 MG tablet 0.5MG  BY MOUTH THREE TIMES DAILY AS NEEDED FOR ANXIETY  . aspirin EC 81 MG EC tablet Take 1 tablet (81 mg total) by mouth daily. (Patient taking differently: Take 81 mg by mouth as needed. )  . brimonidine (ALPHAGAN) 0.15 % ophthalmic solution Place 1 drop into both eyes 3 (three) times daily.  . cetirizine (ZYRTEC) 10 MG tablet Take 1 tablet (10 mg total) by mouth daily. (Patient taking differently: Take 10 mg by mouth as needed. )  . dicyclomine (BENTYL) 10 MG capsule Take 1 capsule (10 mg total) by mouth 4 (four) times daily -  before meals and at bedtime. (Patient not taking: Reported on 04/20/2018)  . dorzolamide (TRUSOPT) 2 % ophthalmic solution Place 1 drop into both eyes 3 (three) times daily.  Marland Kitchen escitalopram (LEXAPRO) 10 MG tablet TAKE 1 TABLET BY MOUTH ONCE DAILY  . furosemide (LASIX) 20 MG tablet TAKE 1 TABLET BY MOUTH ONCE DAILY  . NON FORMULARY Vit b12 nasal spray-use weekly  . potassium chloride SA (K-DUR,KLOR-CON) 20 MEQ tablet Take 1 tablet (20 mEq total) by mouth 2 (two) times daily.  . predniSONE (DELTASONE) 20 MG tablet Take 2 tablets (40 mg total) by mouth daily with breakfast. (Patient not taking: Reported on 04/20/2018)   Facility-Administered Encounter Medications as of 04/20/2018  Medication  . cyanocobalamin ((VITAMIN B-12)) injection 1,000 mcg    Activities of Daily Living No flowsheet data found.  Patient Care Team: Janith Lima, MD as PCP - General (Internal Medicine)    Assessment:   This is a routine wellness examination for [name]. Physical assessment deferred to PCP.   Exercise Activities and Dietary recommendations   Diet (meal preparation, eat out, water intake, caffeinated beverages, dairy products, fruits and vegetables): {Desc; diets:16563}   Goals    . Continue to take time for myself daily     I will continue to read the bible and enjoy cooking daily. Enjoy life, family and  worship God.        Fall Risk Fall Risk  04/11/2017 02/28/2017 11/29/2015 01/04/2013  Falls in the past year? No No No No    Depression Screen PHQ 2/9 Scores 04/11/2017 03/09/2017 02/28/2017 11/29/2015  PHQ - 2 Score 2 0 0 0  PHQ- 9 Score 2 0 - -     Cognitive Function MMSE - Mini Mental State Exam 04/11/2017  Orientation to time 5  Orientation to Place 5  Registration 3  Attention/ Calculation 5  Recall 2  Language- name 2 objects 2  Language- repeat 1  Language- follow 3 step command 3  Language- read & follow direction 1  Write a sentence 1  Copy design 1  Total score 29        Immunization History  Administered Date(s) Administered  . Influenza Split 03/27/2011, 03/30/2012  . Influenza Whole 04/06/2009, 04/12/2010  . Influenza, High Dose Seasonal PF 03/09/2017  . Influenza,inj,Quad PF,6+ Mos 04/18/2013, 02/19/2014, 03/31/2015, 03/04/2016  . Pneumococcal Conjugate-13 03/18/2016  . Pneumococcal Polysaccharide-23 03/21/2007  . Tdap 10/17/2011, 10/04/2017   Screening Tests Health Maintenance  Topic Date Due  . INFLUENZA VACCINE  09/19/2018 (Originally 01/18/2018)  . TETANUS/TDAP  10/05/2027  . PNA vac Low Risk Adult  Completed  . DEXA SCAN  Discontinued      Plan:     I have personally reviewed and noted the following in the patient's chart:   . Medical and social history . Use of alcohol, tobacco or illicit drugs  . Current medications and supplements . Functional ability and status . Nutritional status . Physical activity . Advanced directives . List of other physicians . Vitals . Screenings to include cognitive, depression, and falls . Referrals and appointments  In addition, I have reviewed and discussed with patient certain preventive protocols, quality metrics, and best practice recommendations. A written personalized care plan for preventive services as well as general preventive health recommendations were provided to patient.     Michiel Cowboy,  RN  04/20/2018

## 2018-04-20 NOTE — Progress Notes (Signed)
Per pt, no allergies to soy or egg products.Pt not taking any weight loss meds or using  O2 at home.  Pt refused emmi video. 

## 2018-04-23 ENCOUNTER — Other Ambulatory Visit: Payer: Self-pay | Admitting: Internal Medicine

## 2018-04-23 DIAGNOSIS — F411 Generalized anxiety disorder: Secondary | ICD-10-CM

## 2018-04-30 ENCOUNTER — Ambulatory Visit: Payer: Medicare Other

## 2018-05-01 ENCOUNTER — Other Ambulatory Visit: Payer: Self-pay | Admitting: Internal Medicine

## 2018-05-01 DIAGNOSIS — I10 Essential (primary) hypertension: Secondary | ICD-10-CM

## 2018-05-02 ENCOUNTER — Encounter: Payer: Self-pay | Admitting: Gastroenterology

## 2018-05-02 ENCOUNTER — Ambulatory Visit (AMBULATORY_SURGERY_CENTER): Payer: Medicare Other | Admitting: Gastroenterology

## 2018-05-02 VITALS — BP 138/55 | HR 50 | Temp 95.7°F | Resp 16 | Ht 63.0 in | Wt 168.0 lb

## 2018-05-02 DIAGNOSIS — D122 Benign neoplasm of ascending colon: Secondary | ICD-10-CM

## 2018-05-02 DIAGNOSIS — Z1211 Encounter for screening for malignant neoplasm of colon: Secondary | ICD-10-CM | POA: Diagnosis not present

## 2018-05-02 DIAGNOSIS — D120 Benign neoplasm of cecum: Secondary | ICD-10-CM

## 2018-05-02 MED ORDER — SODIUM CHLORIDE 0.9 % IV SOLN: 500.0000 mL | Freq: Once | INTRAVENOUS | Status: DC

## 2018-05-02 NOTE — Progress Notes (Signed)
PT taken to PACU. Monitors in place. VSS. Report given to RN. 

## 2018-05-02 NOTE — Op Note (Signed)
[facility] Patient Name: [name] Procedure Date: 05/02/2018 8:28 AM MRN: [id number] Endoscopist: Remo Lipps P. Havery Moros , MD Age: 77 Referring MD:  Date of Birth: 1940/09/06 Gender: Female Account #: [id number] Procedure:                Colonoscopy Indications:              Screening for colorectal malignant neoplasm Medicines:                Monitored Anesthesia Care Procedure:                Pre-Anesthesia Assessment:                           - Prior to the procedure, a History and Physical                            was performed, and patient medications and                            allergies were reviewed. The patient's tolerance of                            previous anesthesia was also reviewed. The risks                            and benefits of the procedure and the sedation                            options and risks were discussed with the patient.                            All questions were answered, and informed consent                            was obtained. Prior Anticoagulants: The patient has                            taken no previous anticoagulant or antiplatelet                            agents. ASA Grade Assessment: III - A patient with                            severe systemic disease. After reviewing the risks                            and benefits, the patient was deemed in                            satisfactory condition to undergo the procedure.                           After obtaining informed consent, the colonoscope  was passed under direct vision. Throughout the                            procedure, the patient's blood pressure, pulse, and                            oxygen saturations were monitored continuously. The                            Colonoscope was introduced through the anus and                            advanced to the the cecum, identified by                            appendiceal  orifice and ileocecal valve. The                            colonoscopy was performed without difficulty. The                            patient tolerated the procedure well. The quality                            of the bowel preparation was good. The ileocecal                            valve, appendiceal orifice, and rectum were                            photographed. Scope In: 8:50:02 AM Scope Out: 9:11:12 AM Scope Withdrawal Time: 0 hours 15 minutes 42 seconds  Total Procedure Duration: 0 hours 21 minutes 10 seconds  Findings:                 The perianal and digital rectal examinations were                            normal other than some small skin tags.                           A 3 mm polyp was found in the cecum. The polyp was                            sessile. The polyp was removed with a cold snare.                            Resection and retrieval were complete.                           A single medium-sized angiodysplastic lesion was                            found in the ascending colon.  Five sessile polyps were found in the ascending                            colon. The polyps were 3 to 7 mm in size. These                            polyps were removed with a cold snare. Resection                            and retrieval were complete.                           Multiple medium-mouthed diverticula were found in                            the sigmoid colon and ascending colon. A very sharp                            angulated turn was difficult to navigate in the                            sigmoid colon due to multiple diverticuli with                            scarring.                           The exam was otherwise without abnormality. Complications:            No immediate complications. Estimated blood loss:                            Minimal. Estimated Blood Loss:     Estimated blood loss was minimal. Impression:               - One  3 mm polyp in the cecum, removed with a cold                            snare. Resected and retrieved.                           - A single colonic angiodysplastic lesion.                           - Five 3 to 7 mm polyps in the ascending colon,                            removed with a cold snare. Resected and retrieved.                           - Diverticulosis in the sigmoid colon and in the                            ascending colon.                           -  The examination was otherwise normal. Recommendation:           - Patient has a contact number available for                            emergencies. The signs and symptoms of potential                            delayed complications were discussed with the                            patient. Return to normal activities tomorrow.                            Written discharge instructions were provided to the                            patient.                           - Resume previous diet.                           - Continue present medications.                           - Await pathology results. Remo Lipps P. Neaveh Belanger, MD 05/02/2018 9:17:03 AM This report has been signed electronically.

## 2018-05-02 NOTE — Patient Instructions (Signed)
Continue present medications. Please read handout on polyps and diverticulosis.     YOU HAD AN ENDOSCOPIC PROCEDURE TODAY AT [facility]:   Refer to the procedure report that was given to you for any specific questions about what was found during the examination.  If the procedure report does not answer your questions, please call your gastroenterologist to clarify.  If you requested that your care partner not be given the details of your procedure findings, then the procedure report has been included in a sealed envelope for you to review at your convenience later.  YOU SHOULD EXPECT: Some feelings of bloating in the abdomen. Passage of more gas than usual.  Walking can help get rid of the air that was put into your GI tract during the procedure and reduce the bloating. If you had a lower endoscopy (such as a colonoscopy or flexible sigmoidoscopy) you may notice spotting of blood in your stool or on the toilet paper. If you underwent a bowel prep for your procedure, you may not have a normal bowel movement for a few days.  Please Note:  You might notice some irritation and congestion in your nose or some drainage.  This is from the oxygen used during your procedure.  There is no need for concern and it should clear up in a day or so.  SYMPTOMS TO REPORT IMMEDIATELY:   Following lower endoscopy (colonoscopy or flexible sigmoidoscopy):  Excessive amounts of blood in the stool  Significant tenderness or worsening of abdominal pains  Swelling of the abdomen that is new, acute  Fever of 100F or higher    For urgent or emergent issues, a gastroenterologist can be reached at any hour by calling [phone].   DIET:  We do recommend a small meal at first, but then you may proceed to your regular diet.  Drink plenty of fluids but you should avoid alcoholic beverages for 24 hours.  ACTIVITY:  You should plan to take it easy for the rest of today and you should NOT DRIVE or use  heavy machinery until tomorrow (because of the sedation medicines used during the test).    FOLLOW UP: Our staff will call the number listed on your records the next business day following your procedure to check on you and address any questions or concerns that you may have regarding the information given to you following your procedure. If we do not reach you, we will leave a message.  However, if you are feeling well and you are not experiencing any problems, there is no need to return our call.  We will assume that you have returned to your regular daily activities without incident.  If any biopsies were taken you will be contacted by phone or by letter within the next 1-3 weeks.  Please call us at [phone] if you have not heard about the biopsies in 3 weeks.    SIGNATURES/CONFIDENTIALITY: You and/or your care partner have signed paperwork which will be entered into your electronic medical record.  These signatures attest to the fact that that the information above on your After Visit Summary has been reviewed and is understood.  Full responsibility of the confidentiality of this discharge information lies with you and/or your care-partner.

## 2018-05-02 NOTE — Progress Notes (Signed)
Called to room to assist during endoscopic procedure.  Patient ID and intended procedure confirmed with present staff. Received instructions for my participation in the procedure from the performing physician.  

## 2018-05-02 NOTE — Progress Notes (Signed)
Pt's states no medical or surgical changes since previsit or office visit. 

## 2018-05-03 ENCOUNTER — Telehealth: Payer: Self-pay | Admitting: *Deleted

## 2018-05-03 ENCOUNTER — Telehealth: Payer: Self-pay

## 2018-05-03 NOTE — Telephone Encounter (Signed)
  Follow up Call-  Call back number 05/02/2018  Post procedure Call Back phone  # [phone]  Permission to leave phone message Yes  Some recent data might be hidden     Patient questions:  Do you have a fever, pain , or abdominal swelling? No. Pain Score  0 *  Have you tolerated food without any problems? Yes.    Have you been able to return to your normal activities? Yes.    Do you have any questions about your discharge instructions: Diet   No. Medications  No. Follow up visit  No.  Do you have questions or concerns about your Care? No.  Actions: * If pain score is 4 or above: No action needed, pain <4.

## 2018-05-03 NOTE — Telephone Encounter (Signed)
  Follow up Call-  No answer, left message to call if questions or concerns.    

## 2018-05-09 ENCOUNTER — Ambulatory Visit: Payer: Self-pay | Admitting: *Deleted

## 2018-05-09 ENCOUNTER — Other Ambulatory Visit: Payer: Self-pay | Admitting: Family

## 2018-05-09 ENCOUNTER — Encounter: Payer: Self-pay | Admitting: Family

## 2018-05-09 ENCOUNTER — Ambulatory Visit: Payer: Medicare Other | Admitting: Family

## 2018-05-09 VITALS — BP 130/84 | HR 92 | Temp 99.3°F | Ht 63.0 in | Wt 163.6 lb

## 2018-05-09 DIAGNOSIS — J410 Simple chronic bronchitis: Secondary | ICD-10-CM

## 2018-05-09 DIAGNOSIS — D518 Other vitamin B12 deficiency anemias: Secondary | ICD-10-CM | POA: Diagnosis not present

## 2018-05-09 DIAGNOSIS — F4321 Adjustment disorder with depressed mood: Secondary | ICD-10-CM

## 2018-05-09 MED ORDER — CYANOCOBALAMIN 1000 MCG/ML IJ SOLN
1000.0000 ug | Freq: Once | INTRAMUSCULAR | Status: AC
  Administered 2018-05-09: 1000 ug via INTRAMUSCULAR

## 2018-05-09 MED ORDER — ESCITALOPRAM OXALATE 20 MG PO TABS: 20.0000 mg | ORAL_TABLET | Freq: Every day | ORAL | 3 refills | Status: DC

## 2018-05-09 MED ORDER — METHYLPREDNISOLONE ACETATE 40 MG/ML IJ SUSP
40.0000 mg | Freq: Once | INTRAMUSCULAR | Status: AC
  Administered 2018-05-09: 40 mg via INTRAMUSCULAR

## 2018-05-09 MED ORDER — BENZONATATE 100 MG PO CAPS: 100.0000 mg | ORAL_CAPSULE | Freq: Three times a day (TID) | ORAL | 0 refills | Status: DC | PRN

## 2018-05-09 MED ORDER — CEFDINIR 300 MG PO CAPS: 300.0000 mg | ORAL_CAPSULE | Freq: Two times a day (BID) | ORAL | 0 refills | Status: DC

## 2018-05-09 NOTE — Telephone Encounter (Signed)
Pt reports non-productive cough,onset Monday. States "Woke up hoarse this AM." Reports "Cough is keeping me up at night." Has tried "Cough syrup, didn't help." States afebrile. Reports mild chest tightness "At collar bones"  "Mild"  SOB with coughing. States she has had several episodes of similar symptoms within past months. Same day appt made with L. Valere Dross. Care advise given per protocol. Reason for Disposition . [1] Continuous (nonstop) coughing interferes with work or school AND [2] no improvement using cough treatment per protocol  Answer Assessment - Initial Assessment Questions 1. ONSET: "When did the cough begin?"      2 days ago 2. SEVERITY: "How bad is the cough today?"      "Pretty bad" 3. RESPIRATORY DISTRESS: "Describe your breathing."      "Just hoarse" 4. FEVER: "Do you have a fever?" If so, ask: "What is your temperature, how was it measured, and when did it start?"     no 5. HEMOPTYSIS: "Are you coughing up any blood?" If so ask: "How much?" (flecks, streaks, tablespoons, etc.)     no 6. TREATMENT: "What have you done so far to treat the cough?" (e.g., meds, fluids, humidifier)     Cough syrup 7. CARDIAC HISTORY: "Do you have any history of heart disease?" (e.g., heart attack, congestive heart failure)      HTN  CAD 8. LUNG HISTORY: "Do you have any history of lung disease?"  (e.g., pulmonary embolus, asthma, emphysema)     no 9. PE RISK FACTORS: "Do you have a history of blood clots?" (or: recent major surgery, recent prolonged travel, bedridden)      10. OTHER SYMPTOMS: "Do you have any other symptoms? (e.g., runny nose, wheezing, chest pain)       Tightness "At collar bones."  Protocols used: COUGH - ACUTE NON-PRODUCTIVE-A-AH

## 2018-05-09 NOTE — Progress Notes (Signed)
[name] is a 77 y.o. female with the following history as recorded in EpicCare:  Patient Active Problem List   Diagnosis Date Noted  . Lower abdominal pain 11/30/2017  . Pneumonia of right middle lobe due to infectious organism ([place]) 07/10/2017  . Herpes zoster with complication 04/54/0981  . Reactive airway disease 06/23/2017  . TIA (transient ischemic attack) 02/28/2017  . Depression with somatization 12/13/2016  . Allergic rhinitis due to pollen 06/21/2016  . Simple chronic bronchitis ([place]) 06/21/2016  . Routine general medical examination at a health care facility 11/29/2015  . GAD (generalized anxiety disorder) 12/02/2014  . IBS (irritable bowel syndrome) 01/20/2014  . B12 deficiency anemia 07/10/2013  . Unspecified vitamin D deficiency 01/04/2013  . HTN (hypertension) 07/26/2011  . CAD (coronary artery disease) 07/26/2011  . FATTY LIVER DISEASE 06/20/2009  . OBESITY 09/08/2007  . OSTEOPENIA 09/07/2007  . Hyperlipidemia with target LDL less than 100 07/30/2007    Current Outpatient Medications  Medication Sig Dispense Refill  . ALPRAZolam (XANAX) 0.5 MG tablet TAKE 1 TABLET BY MOUTH THREE TIMES DAILY AS NEEDED FOR ANXIETY 90 tablet 2  . aspirin EC 81 MG EC tablet Take 1 tablet (81 mg total) by mouth daily. (Patient taking differently: Take 81 mg by mouth as needed. ) 30 tablet 0  . brimonidine (ALPHAGAN) 0.15 % ophthalmic solution Place 1 drop into both eyes 3 (three) times daily.  3  . cetirizine (ZYRTEC) 10 MG tablet Take 1 tablet (10 mg total) by mouth daily. (Patient taking differently: Take 10 mg by mouth as needed. ) 30 tablet 11  . dorzolamide (TRUSOPT) 2 % ophthalmic solution Place 1 drop into both eyes 3 (three) times daily.  1  . furosemide (LASIX) 20 MG tablet TAKE 1 TABLET BY MOUTH ONCE DAILY 90 tablet 0  . NON FORMULARY Vit b12 nasal spray-use weekly    . potassium chloride SA (K-DUR,KLOR-CON) 20 MEQ tablet TAKE 1 TABLET BY MOUTH TWICE DAILY 180 tablet 1  .  benzonatate (TESSALON) 100 MG capsule Take 1 capsule (100 mg total) by mouth 3 (three) times daily as needed. 30 capsule 0  . cefdinir (OMNICEF) 300 MG capsule Take 1 capsule (300 mg total) by mouth 2 (two) times daily. 20 capsule 0  . dicyclomine (BENTYL) 10 MG capsule Take 1 capsule (10 mg total) by mouth 4 (four) times daily -  before meals and at bedtime. (Patient not taking: Reported on 04/20/2018) 30 capsule 0  . escitalopram (LEXAPRO) 20 MG tablet Take 1 tablet (20 mg total) by mouth daily. 30 tablet 3   Current Facility-Administered Medications  Medication Dose Route Frequency Provider Last Rate Last Dose  . cyanocobalamin ((VITAMIN B-12)) injection 1,000 mcg  1,000 mcg Intramuscular Once Marrian Salvage, FNP      . cyanocobalamin ((VITAMIN B-12)) injection 1,000 mcg  1,000 mcg Intramuscular Once Marrian Salvage, FNP      . methylPREDNISolone acetate (DEPO-MEDROL) injection 40 mg  40 mg Intramuscular Once Marrian Salvage, FNP        Allergies: Diltiazem hcl; Statins; Zetia [ezetimibe]; Codeine; Sulfonamide derivatives; Tape; and Tramadol  Past Medical History:  Diagnosis Date  . Anxiety state, unspecified   . CAD (coronary artery disease)   . Disorder of bone and cartilage, unspecified   . Diverticulosis of colon (without mention of hemorrhage)   . Dizziness and giddiness   . Fatty liver   . Fibromyalgia   . Obesity, unspecified   . Other and unspecified  hyperlipidemia   . Other chronic nonalcoholic liver disease   . Skin cancer    basal cell on face  . Stroke [facility])    TIA  . TIA (transient ischemic attack)   . Unspecified essential hypertension     Past Surgical History:  Procedure Laterality Date  . BACK SURGERY    . CHOLECYSTECTOMY    . MOHS SURGERY    . TONSILLECTOMY AND ADENOIDECTOMY    . TOTAL ABDOMINAL HYSTERECTOMY W/ BILATERAL SALPINGOOPHORECTOMY      Family History  Problem Relation Age of Onset  . Stomach cancer Mother   . Cancer Brother    . Breast cancer Sister   . Other Father   . Breast cancer Sister     Social History   Tobacco Use  . Smoking status: Never Smoker  . Smokeless tobacco: Never Used  Substance Use Topics  . Alcohol use: No    Subjective:  Patient presents with 3-4 day history of cough/ congestion; + wheezing; + low-grade fever; treated for bronchitis last month with oral steroids; prone to recurrent bronchitis  Also actively grieving loss of her husband- end of August; would like to increase Lexapro; considering grief counseling but not ready to pursue at this time; children making meals for her everyday; notes that appetite is down; admits personal stress high;  Requesting B12 shot today if possible also;    Objective:  Vitals:   05/09/18 1128  BP: 130/84  Pulse: 92  Temp: 99.3 F (37.4 C)  TempSrc: Oral  SpO2: 96%  Weight: 163 lb 9.6 oz (74.2 kg)  Height: 5\' 3"  (1.6 m)    General: Well developed, well nourished, in no acute distress  Skin : Warm and dry.  Head: Normocephalic and atraumatic  Eyes: Sclera and conjunctiva clear; pupils round and reactive to light; extraocular movements intact  Ears: External normal; canals clear; tympanic membranes normal  Oropharynx: Pink, supple. No suspicious lesions  Neck: Supple without thyromegaly, adenopathy  Lungs: Respirations unlabored; clear to auscultation bilaterally without wheeze, rales, rhonchi  CVS exam: normal rate and regular rhythm.  Neurologic: Alert and oriented; speech intact; face symmetrical; moves all extremities well; CNII-XII intact without focal deficit   Assessment:  1. Simple chronic bronchitis (HCC)   2. Other vitamin B12 deficiency anemia   3. Situational depression     Plan:  1. Will go ahead and treat with antibiotics since she was seen with similar symptoms in the past month; ? For underlying infection; she is requesting Depo-Medrol IM 40 mg today; Rx for Omnicef 300 mg bid x 10 days; she agrees to start taking her  Zyrtec at night and use her nasal spray which she has at home; 2. B12 injection given as requested; 3. Increase Lexapro to 20 mg daily; encouraged her to ask her children for support; encouraged her to consider grief counseling but she notes she is not ready to pursue at this time; weight is down- try to eat regular meals; follow-up in 1 month, sooner prn.  Return in about 1 month (around 06/08/2018) for with Dr. Ronnald Ramp or me.  No orders of the defined types were placed in this encounter.   Requested Prescriptions   Signed Prescriptions Disp Refills  . escitalopram (LEXAPRO) 20 MG tablet 30 tablet 3    Sig: Take 1 tablet (20 mg total) by mouth daily.  . cefdinir (OMNICEF) 300 MG capsule 20 capsule 0    Sig: Take 1 capsule (300 mg total) by mouth 2 (  two) times daily.  . benzonatate (TESSALON) 100 MG capsule 30 capsule 0    Sig: Take 1 capsule (100 mg total) by mouth 3 (three) times daily as needed.

## 2018-05-09 NOTE — Telephone Encounter (Signed)
FYI

## 2018-06-08 ENCOUNTER — Ambulatory Visit: Payer: Medicare Other | Admitting: Family

## 2018-06-08 ENCOUNTER — Encounter: Payer: Self-pay | Admitting: Family

## 2018-06-08 VITALS — BP 128/74 | HR 64 | Temp 98.3°F | Ht 63.0 in | Wt 159.1 lb

## 2018-06-08 DIAGNOSIS — R1030 Lower abdominal pain, unspecified: Secondary | ICD-10-CM

## 2018-06-08 DIAGNOSIS — E538 Deficiency of other specified B group vitamins: Secondary | ICD-10-CM | POA: Diagnosis not present

## 2018-06-08 DIAGNOSIS — F418 Other specified anxiety disorders: Secondary | ICD-10-CM | POA: Diagnosis not present

## 2018-06-08 DIAGNOSIS — F4321 Adjustment disorder with depressed mood: Secondary | ICD-10-CM

## 2018-06-08 DIAGNOSIS — D510 Vitamin B12 deficiency anemia due to intrinsic factor deficiency: Secondary | ICD-10-CM

## 2018-06-08 DIAGNOSIS — D518 Other vitamin B12 deficiency anemias: Secondary | ICD-10-CM | POA: Diagnosis not present

## 2018-06-08 MED ORDER — DICYCLOMINE HCL 10 MG PO CAPS: 10.0000 mg | ORAL_CAPSULE | Freq: Three times a day (TID) | ORAL | 1 refills | Status: DC

## 2018-06-08 MED ORDER — CYANOCOBALAMIN 1000 MCG/ML IJ SOLN
1000.0000 ug | Freq: Once | INTRAMUSCULAR | Status: AC
  Administered 2018-06-08: 1000 ug via INTRAMUSCULAR

## 2018-06-08 NOTE — Progress Notes (Signed)
[name] is a 77 y.o. female with the following history as recorded in EpicCare:  Patient Active Problem List   Diagnosis Date Noted  . Lower abdominal pain 11/30/2017  . Pneumonia of right middle lobe due to infectious organism ([place]) 07/10/2017  . Herpes zoster with complication 01/60/1093  . Reactive airway disease 06/23/2017  . TIA (transient ischemic attack) 02/28/2017  . Depression with somatization 12/13/2016  . Allergic rhinitis due to pollen 06/21/2016  . Simple chronic bronchitis ([place]) 06/21/2016  . Routine general medical examination at a health care facility 11/29/2015  . GAD (generalized anxiety disorder) 12/02/2014  . IBS (irritable bowel syndrome) 01/20/2014  . B12 deficiency anemia 07/10/2013  . Unspecified vitamin D deficiency 01/04/2013  . HTN (hypertension) 07/26/2011  . CAD (coronary artery disease) 07/26/2011  . FATTY LIVER DISEASE 06/20/2009  . OBESITY 09/08/2007  . OSTEOPENIA 09/07/2007  . Hyperlipidemia with target LDL less than 100 07/30/2007    Current Outpatient Medications  Medication Sig Dispense Refill  . ALPRAZolam (XANAX) 0.5 MG tablet TAKE 1 TABLET BY MOUTH THREE TIMES DAILY AS NEEDED FOR ANXIETY 90 tablet 2  . aspirin EC 81 MG EC tablet Take 1 tablet (81 mg total) by mouth daily. (Patient taking differently: Take 81 mg by mouth as needed. ) 30 tablet 0  . brimonidine (ALPHAGAN) 0.15 % ophthalmic solution Place 1 drop into both eyes 3 (three) times daily.  3  . cetirizine (ZYRTEC) 10 MG tablet Take 1 tablet (10 mg total) by mouth daily. (Patient taking differently: Take 10 mg by mouth as needed. ) 30 tablet 11  . dicyclomine (BENTYL) 10 MG capsule Take 1 capsule (10 mg total) by mouth 4 (four) times daily -  before meals and at bedtime. 30 capsule 1  . dorzolamide (TRUSOPT) 2 % ophthalmic solution Place 1 drop into both eyes 3 (three) times daily.  1  . escitalopram (LEXAPRO) 20 MG tablet Take 1 tablet (20 mg total) by mouth daily. 30 tablet 3   . furosemide (LASIX) 20 MG tablet TAKE 1 TABLET BY MOUTH ONCE DAILY 90 tablet 0  . NON FORMULARY Vit b12 nasal spray-use weekly    . potassium chloride SA (K-DUR,KLOR-CON) 20 MEQ tablet TAKE 1 TABLET BY MOUTH TWICE DAILY 180 tablet 1   Current Facility-Administered Medications  Medication Dose Route Frequency Provider Last Rate Last Dose  . cyanocobalamin ((VITAMIN B-12)) injection 1,000 mcg  1,000 mcg Intramuscular Once Marrian Salvage, FNP        Allergies: Diltiazem hcl; Statins; Zetia [ezetimibe]; Codeine; Sulfonamide derivatives; Tape; and Tramadol  Past Medical History:  Diagnosis Date  . Anxiety state, unspecified   . CAD (coronary artery disease)   . Disorder of bone and cartilage, unspecified   . Diverticulosis of colon (without mention of hemorrhage)   . Dizziness and giddiness   . Fatty liver   . Fibromyalgia   . Obesity, unspecified   . Other and unspecified hyperlipidemia   . Other chronic nonalcoholic liver disease   . Skin cancer    basal cell on face  . Stroke [facility])    TIA  . TIA (transient ischemic attack)   . Unspecified essential hypertension     Past Surgical History:  Procedure Laterality Date  . BACK SURGERY    . CHOLECYSTECTOMY    . MOHS SURGERY    . TONSILLECTOMY AND ADENOIDECTOMY    . TOTAL ABDOMINAL HYSTERECTOMY W/ BILATERAL SALPINGOOPHORECTOMY      Family History  Problem Relation  Age of Onset  . Stomach cancer Mother   . Cancer Brother   . Breast cancer Sister   . Other Father   . Breast cancer Sister     Social History   Tobacco Use  . Smoking status: Never Smoker  . Smokeless tobacco: Never Used  Substance Use Topics  . Alcohol use: No    Subjective:  Patient presents for one month follow-up- at last OV, Lexapro was increased to 20 mg; has been feeling much better since dosage was changed. Admits that personal stress level is very high- appetite is down/ "just don't feel much like eating." Requesting a refill on Bentyl to  help with IBS; Would like to get B12 shot today; did seem to offer energy today;  Knows that she needs to consider grief counseling/ find something to get her out of the house;     Objective:  Vitals:   06/08/18 1314  BP: 128/74  Pulse: 64  Temp: 98.3 F (36.8 C)  TempSrc: Oral  SpO2: 98%  Weight: 159 lb 1.9 oz (72.2 kg)  Height: 5\' 3"  (1.6 m)    General: Well developed, well nourished, in no acute distress  Skin : Warm and dry.  Head: Normocephalic and atraumatic  Lungs: Respirations unlabored; clear to auscultation bilaterally without wheeze, rales, rhonchi  Neurologic: Alert and oriented; speech intact; face symmetrical; moves all extremities well; CNII-XII intact without focal deficit   Assessment:  1. Grief reaction   2. Lower abdominal pain   3. Situational anxiety   4. Other vitamin B12 deficiency anemia   5. Vitamin B12 deficiency anemia due to intrinsic factor deficiency   6. B12 deficiency     Plan:  Responding well to Lexapro 20 mg daily; refill on Bentyl to use as needed; Discussion about starting grief counseling- she agrees to contact Hospice; B12 injection given;  Spent 30 minutes with patient; greater than 50% spent in counseling;    Return in about 1 month (around 07/09/2018).  No orders of the defined types were placed in this encounter.   Requested Prescriptions   Signed Prescriptions Disp Refills  . dicyclomine (BENTYL) 10 MG capsule 30 capsule 1    Sig: Take 1 capsule (10 mg total) by mouth 4 (four) times daily -  before meals and at bedtime.

## 2018-06-26 ENCOUNTER — Other Ambulatory Visit: Payer: Self-pay | Admitting: Internal Medicine

## 2018-07-10 ENCOUNTER — Ambulatory Visit (INDEPENDENT_AMBULATORY_CARE_PROVIDER_SITE_OTHER): Payer: PPO | Admitting: Family

## 2018-07-10 ENCOUNTER — Encounter: Payer: Self-pay | Admitting: Family

## 2018-07-10 VITALS — BP 122/80 | HR 58 | Temp 98.2°F | Ht 63.0 in | Wt 163.1 lb

## 2018-07-10 DIAGNOSIS — F4321 Adjustment disorder with depressed mood: Secondary | ICD-10-CM

## 2018-07-10 DIAGNOSIS — F411 Generalized anxiety disorder: Secondary | ICD-10-CM | POA: Diagnosis not present

## 2018-07-10 DIAGNOSIS — R1030 Lower abdominal pain, unspecified: Secondary | ICD-10-CM

## 2018-07-10 DIAGNOSIS — E538 Deficiency of other specified B group vitamins: Secondary | ICD-10-CM

## 2018-07-10 MED ORDER — DICYCLOMINE HCL 10 MG PO CAPS: 10.0000 mg | ORAL_CAPSULE | Freq: Three times a day (TID) | ORAL | 1 refills | Status: DC

## 2018-07-10 MED ORDER — ESCITALOPRAM OXALATE 20 MG PO TABS: 20.0000 mg | ORAL_TABLET | Freq: Every day | ORAL | 1 refills | Status: DC

## 2018-07-10 MED ORDER — CYANOCOBALAMIN 1000 MCG/ML IJ SOLN
1000.0000 ug | Freq: Once | INTRAMUSCULAR | Status: AC
  Administered 2018-07-10: 1000 ug via INTRAMUSCULAR

## 2018-07-10 MED ORDER — CETIRIZINE HCL 10 MG PO TABS: 10.0000 mg | ORAL_TABLET | Freq: Every day | ORAL | 3 refills | Status: DC

## 2018-07-10 NOTE — Progress Notes (Signed)
[name] is a 78 y.o. female with the following history as recorded in EpicCare:  Patient Active Problem List   Diagnosis Date Noted  . Lower abdominal pain 11/30/2017  . Pneumonia of right middle lobe due to infectious organism ([place]) 07/10/2017  . Herpes zoster with complication 93/79/0240  . Reactive airway disease 06/23/2017  . TIA (transient ischemic attack) 02/28/2017  . Depression with somatization 12/13/2016  . Allergic rhinitis due to pollen 06/21/2016  . Simple chronic bronchitis ([place]) 06/21/2016  . Routine general medical examination at a health care facility 11/29/2015  . GAD (generalized anxiety disorder) 12/02/2014  . IBS (irritable bowel syndrome) 01/20/2014  . B12 deficiency anemia 07/10/2013  . Unspecified vitamin D deficiency 01/04/2013  . HTN (hypertension) 07/26/2011  . CAD (coronary artery disease) 07/26/2011  . FATTY LIVER DISEASE 06/20/2009  . OBESITY 09/08/2007  . OSTEOPENIA 09/07/2007  . Hyperlipidemia with target LDL less than 100 07/30/2007    Current Outpatient Medications  Medication Sig Dispense Refill  . ALPRAZolam (XANAX) 0.5 MG tablet TAKE 1 TABLET BY MOUTH THREE TIMES DAILY AS NEEDED FOR ANXIETY 90 tablet 2  . aspirin EC 81 MG EC tablet Take 1 tablet (81 mg total) by mouth daily. (Patient taking differently: Take 81 mg by mouth as needed. ) 30 tablet 0  . brimonidine (ALPHAGAN) 0.15 % ophthalmic solution Place 1 drop into both eyes 3 (three) times daily.  3  . cetirizine (ZYRTEC) 10 MG tablet Take 1 tablet (10 mg total) by mouth daily. 90 tablet 3  . dicyclomine (BENTYL) 10 MG capsule Take 1 capsule (10 mg total) by mouth 4 (four) times daily -  before meals and at bedtime. 360 capsule 1  . dorzolamide (TRUSOPT) 2 % ophthalmic solution Place 1 drop into both eyes 3 (three) times daily.  1  . escitalopram (LEXAPRO) 20 MG tablet Take 1 tablet (20 mg total) by mouth daily. 90 tablet 1  . furosemide (LASIX) 20 MG tablet TAKE 1 TABLET BY MOUTH ONCE  DAILY 90 tablet 0  . Misc Natural Products (TART CHERRY ADVANCED) CAPS Take by mouth.    . NON FORMULARY Vit b12 nasal spray-use weekly    . potassium chloride SA (K-DUR,KLOR-CON) 20 MEQ tablet TAKE 1 TABLET BY MOUTH TWICE DAILY 180 tablet 1   Current Facility-Administered Medications  Medication Dose Route Frequency Provider Last Rate Last Dose  . cyanocobalamin ((VITAMIN B-12)) injection 1,000 mcg  1,000 mcg Intramuscular Once Marrian Salvage, FNP        Allergies: Diltiazem hcl; Statins; Zetia [ezetimibe]; Codeine; Sulfonamide derivatives; Tape; and Tramadol  Past Medical History:  Diagnosis Date  . Anxiety state, unspecified   . CAD (coronary artery disease)   . Disorder of bone and cartilage, unspecified   . Diverticulosis of colon (without mention of hemorrhage)   . Dizziness and giddiness   . Fatty liver   . Fibromyalgia   . Obesity, unspecified   . Other and unspecified hyperlipidemia   . Other chronic nonalcoholic liver disease   . Skin cancer    basal cell on face  . Stroke [facility])    TIA  . TIA (transient ischemic attack)   . Unspecified essential hypertension     Past Surgical History:  Procedure Laterality Date  . BACK SURGERY    . CHOLECYSTECTOMY    . MOHS SURGERY    . TONSILLECTOMY AND ADENOIDECTOMY    . TOTAL ABDOMINAL HYSTERECTOMY W/ BILATERAL SALPINGOOPHORECTOMY      Family History  Problem Relation Age of Onset  . Stomach cancer Mother   . Cancer Brother   . Breast cancer Sister   . Other Father   . Breast cancer Sister     Social History   Tobacco Use  . Smoking status: Never Smoker  . Smokeless tobacco: Never Used  Substance Use Topics  . Alcohol use: No    Subjective:  1 month follow-up to follow-up on anxiety secondary to loss of her husband last year; has gained 4 pounds since last OV; notes that is in a much better place; feels like a "light switch flipped" around [place] Year[place] day and she decided she was ready to live/take care of  herself; will be keeping her 48 1/2 month old great-grandchild and very excited about this opportunity; now making bracelets to share as gifts- enjoying her jewelry; feels like regular B12 shots are helping also;    Also needs to get her prescriptions updated- has new insurance and needs everything written for 90 day supply.   Objective:  Vitals:   07/10/18 0949  BP: 122/80  Pulse: (!) 58  Temp: 98.2 F (36.8 C)  TempSrc: Oral  SpO2: 97%  Weight: 163 lb 1.9 oz (74 kg)  Height: 5\' 3"  (1.6 m)    General: Well developed, well nourished, in no acute distress  Skin : Warm and dry.  Head: Normocephalic and atraumatic  Lungs: Respirations unlabored;  Neurologic: Alert and oriented; speech intact; face symmetrical; moves all extremities well; CNII-XII intact without focal deficit   Limited exam as majority of visit spent counseling  Assessment:  1. B12 deficiency   2. Lower abdominal pain   3. Grief reaction   4. GAD (generalized anxiety disorder)     Plan:  1. Continue monthly B12 shots; 2. Updated Rx for Bentyl- patient notes she is typically taking medication 2x per day with good relief of symptoms. 3. & 4. Marked improvement in patient's outlook today; physically and mentally improved;  Continue Lexaprol 20 mg; follow-up in 3 months, sooner prn.  Spent 30 minutes with patient; greater than 50% spent in counseling;    Return in about 3 months (around 10/09/2018) for 1 month nurse visit B12/ 3 months with me.  No orders of the defined types were placed in this encounter.   Requested Prescriptions   Signed Prescriptions Disp Refills  . dicyclomine (BENTYL) 10 MG capsule 360 capsule 1    Sig: Take 1 capsule (10 mg total) by mouth 4 (four) times daily -  before meals and at bedtime.  Marland Kitchen escitalopram (LEXAPRO) 20 MG tablet 90 tablet 1    Sig: Take 1 tablet (20 mg total) by mouth daily.  . cetirizine (ZYRTEC) 10 MG tablet 90 tablet 3    Sig: Take 1 tablet (10 mg total) by mouth  daily.

## 2018-07-16 DIAGNOSIS — H04122 Dry eye syndrome of left lacrimal gland: Secondary | ICD-10-CM | POA: Diagnosis not present

## 2018-07-16 DIAGNOSIS — H04121 Dry eye syndrome of right lacrimal gland: Secondary | ICD-10-CM | POA: Diagnosis not present

## 2018-07-16 DIAGNOSIS — H401132 Primary open-angle glaucoma, bilateral, moderate stage: Secondary | ICD-10-CM | POA: Diagnosis not present

## 2018-08-10 ENCOUNTER — Ambulatory Visit: Payer: PPO

## 2018-08-13 ENCOUNTER — Ambulatory Visit (INDEPENDENT_AMBULATORY_CARE_PROVIDER_SITE_OTHER): Payer: PPO

## 2018-08-13 DIAGNOSIS — E538 Deficiency of other specified B group vitamins: Secondary | ICD-10-CM

## 2018-08-13 MED ORDER — CYANOCOBALAMIN 1000 MCG/ML IJ SOLN
1000.0000 ug | Freq: Once | INTRAMUSCULAR | Status: AC
  Administered 2018-08-13: 1000 ug via INTRAMUSCULAR

## 2018-08-13 NOTE — Progress Notes (Signed)
I have reviewed and agree.

## 2018-08-24 DIAGNOSIS — H401132 Primary open-angle glaucoma, bilateral, moderate stage: Secondary | ICD-10-CM | POA: Diagnosis not present

## 2018-09-04 ENCOUNTER — Other Ambulatory Visit: Payer: Self-pay | Admitting: Internal Medicine

## 2018-09-04 DIAGNOSIS — F411 Generalized anxiety disorder: Secondary | ICD-10-CM

## 2018-09-14 ENCOUNTER — Ambulatory Visit: Payer: PPO

## 2018-10-01 ENCOUNTER — Telehealth: Payer: Self-pay

## 2018-10-01 NOTE — Telephone Encounter (Signed)
Mailed appointment card out to patient today and schedule has been updated.

## 2018-10-09 ENCOUNTER — Ambulatory Visit: Payer: PPO | Admitting: Family

## 2018-10-19 ENCOUNTER — Other Ambulatory Visit: Payer: Self-pay | Admitting: Family

## 2018-10-19 DIAGNOSIS — R1030 Lower abdominal pain, unspecified: Secondary | ICD-10-CM

## 2018-10-19 MED ORDER — DICYCLOMINE HCL 10 MG PO CAPS: 10.0000 mg | ORAL_CAPSULE | Freq: Three times a day (TID) | ORAL | 1 refills | Status: DC

## 2018-11-14 ENCOUNTER — Telehealth: Payer: Self-pay

## 2018-11-14 NOTE — Telephone Encounter (Signed)
Spoke with patient today. She wants to post-pone her appointment out to June/July as she is not ready to come into the building right now. She has been taking B12 supplements and will continue to do so until her next in office appointment.  Call patient to get her rescheduled for appointment.

## 2018-11-15 ENCOUNTER — Other Ambulatory Visit: Payer: Self-pay | Admitting: Internal Medicine

## 2018-11-15 DIAGNOSIS — I10 Essential (primary) hypertension: Secondary | ICD-10-CM

## 2018-11-16 ENCOUNTER — Ambulatory Visit: Payer: PPO | Admitting: Family

## 2018-11-19 ENCOUNTER — Telehealth: Payer: Self-pay | Admitting: Internal Medicine

## 2018-11-19 ENCOUNTER — Other Ambulatory Visit: Payer: Self-pay | Admitting: Internal Medicine

## 2018-11-19 NOTE — Telephone Encounter (Signed)
Copied from [place] [id number]. Topic: Quick Communication - Rx Refill/Question >> Nov 19, 2018  9:28 AM Burchel, Abbi R wrote: Medication: furosemide (LASIX) 20 MG tablet   Preferred Pharmacy:[facility] [place], [place] [address] Phone: [phone] Fax: [phone]    Pt was advised that RX refills may take up to 3 business days. We ask that you follow-up with your pharmacy.

## 2018-11-26 DIAGNOSIS — H2513 Age-related nuclear cataract, bilateral: Secondary | ICD-10-CM | POA: Diagnosis not present

## 2018-11-26 DIAGNOSIS — H401132 Primary open-angle glaucoma, bilateral, moderate stage: Secondary | ICD-10-CM | POA: Diagnosis not present

## 2018-11-26 DIAGNOSIS — H04121 Dry eye syndrome of right lacrimal gland: Secondary | ICD-10-CM | POA: Diagnosis not present

## 2018-11-26 DIAGNOSIS — H5203 Hypermetropia, bilateral: Secondary | ICD-10-CM | POA: Diagnosis not present

## 2018-11-27 NOTE — Telephone Encounter (Signed)
Schedule patient for July appointment.

## 2018-12-17 NOTE — Telephone Encounter (Signed)
Spoke with patient. She will just call us when she is ready to come back into the office. Said as of now she was feeling fine with doing her B12 injections and staying in her house.

## 2019-01-18 ENCOUNTER — Other Ambulatory Visit: Payer: Self-pay

## 2019-02-12 ENCOUNTER — Encounter: Payer: Self-pay | Admitting: Family

## 2019-02-12 ENCOUNTER — Other Ambulatory Visit: Payer: Self-pay | Admitting: Internal Medicine

## 2019-02-12 ENCOUNTER — Other Ambulatory Visit: Payer: Self-pay | Admitting: Family

## 2019-02-12 DIAGNOSIS — I10 Essential (primary) hypertension: Secondary | ICD-10-CM

## 2019-02-18 ENCOUNTER — Telehealth: Payer: Self-pay | Admitting: Internal Medicine

## 2019-02-18 DIAGNOSIS — I10 Essential (primary) hypertension: Secondary | ICD-10-CM

## 2019-02-18 DIAGNOSIS — F411 Generalized anxiety disorder: Secondary | ICD-10-CM

## 2019-02-19 ENCOUNTER — Other Ambulatory Visit: Payer: Self-pay | Admitting: Internal Medicine

## 2019-02-19 DIAGNOSIS — F411 Generalized anxiety disorder: Secondary | ICD-10-CM

## 2019-02-19 MED ORDER — ALPRAZOLAM 0.5 MG PO TABS: 0.5000 mg | ORAL_TABLET | Freq: Three times a day (TID) | ORAL | 0 refills | Status: DC | PRN

## 2019-02-19 NOTE — Telephone Encounter (Signed)
Pt called and stated that she is completely out of medication and would like to know if a few can be called in until appointment on 02/21/19

## 2019-02-21 ENCOUNTER — Other Ambulatory Visit: Payer: Self-pay | Admitting: Internal Medicine

## 2019-02-21 ENCOUNTER — Ambulatory Visit: Payer: PPO | Admitting: Internal Medicine

## 2019-02-21 DIAGNOSIS — I10 Essential (primary) hypertension: Secondary | ICD-10-CM

## 2019-02-21 MED ORDER — POTASSIUM CHLORIDE CRYS ER 20 MEQ PO TBCR: 20.0000 meq | EXTENDED_RELEASE_TABLET | Freq: Two times a day (BID) | ORAL | 0 refills | Status: DC

## 2019-02-21 MED ORDER — FUROSEMIDE 20 MG PO TABS: 20.0000 mg | ORAL_TABLET | Freq: Every day | ORAL | 0 refills | Status: DC

## 2019-02-21 NOTE — Telephone Encounter (Signed)
Called pt about phone call visit. Pt stated she was told the visit would over the phone by a gentleman.   Pt has been rescheduled for next week. Pt knows that the visit will be in the office.   Can some furosemide and potassium be sent in to get patient to next weeks appointment?

## 2019-02-27 ENCOUNTER — Other Ambulatory Visit: Payer: Self-pay

## 2019-02-27 ENCOUNTER — Encounter: Payer: Self-pay | Admitting: Internal Medicine

## 2019-02-27 ENCOUNTER — Other Ambulatory Visit (INDEPENDENT_AMBULATORY_CARE_PROVIDER_SITE_OTHER): Payer: PPO

## 2019-02-27 ENCOUNTER — Ambulatory Visit (INDEPENDENT_AMBULATORY_CARE_PROVIDER_SITE_OTHER): Payer: PPO | Admitting: Internal Medicine

## 2019-02-27 VITALS — BP 130/74 | HR 62 | Temp 98.3°F | Resp 16 | Ht 63.0 in | Wt 165.0 lb

## 2019-02-27 DIAGNOSIS — I10 Essential (primary) hypertension: Secondary | ICD-10-CM | POA: Diagnosis not present

## 2019-02-27 DIAGNOSIS — E781 Pure hyperglyceridemia: Secondary | ICD-10-CM | POA: Diagnosis not present

## 2019-02-27 DIAGNOSIS — I2510 Atherosclerotic heart disease of native coronary artery without angina pectoris: Secondary | ICD-10-CM

## 2019-02-27 DIAGNOSIS — E785 Hyperlipidemia, unspecified: Secondary | ICD-10-CM

## 2019-02-27 DIAGNOSIS — Z23 Encounter for immunization: Secondary | ICD-10-CM

## 2019-02-27 DIAGNOSIS — D518 Other vitamin B12 deficiency anemias: Secondary | ICD-10-CM

## 2019-02-27 DIAGNOSIS — K7581 Nonalcoholic steatohepatitis (NASH): Secondary | ICD-10-CM | POA: Diagnosis not present

## 2019-02-27 DIAGNOSIS — E559 Vitamin D deficiency, unspecified: Secondary | ICD-10-CM | POA: Diagnosis not present

## 2019-02-27 LAB — HEPATIC FUNCTION PANEL
ALT: 17 U/L (ref 0–35)
AST: 22 U/L (ref 0–37)
Albumin: 4.3 g/dL (ref 3.5–5.2)
Alkaline Phosphatase: 142 U/L — ABNORMAL HIGH (ref 39–117)
Bilirubin, Direct: 0.1 mg/dL (ref 0.0–0.3)
Total Bilirubin: 0.6 mg/dL (ref 0.2–1.2)
Total Protein: 7.1 g/dL (ref 6.0–8.3)

## 2019-02-27 LAB — CBC WITH DIFFERENTIAL/PLATELET
Basophils Absolute: 0 10*3/uL (ref 0.0–0.1)
Basophils Relative: 0.7 % (ref 0.0–3.0)
Eosinophils Absolute: 0.1 10*3/uL (ref 0.0–0.7)
Eosinophils Relative: 2.8 % (ref 0.0–5.0)
HCT: 42.5 % (ref 36.0–46.0)
Hemoglobin: 14.2 g/dL (ref 12.0–15.0)
Lymphocytes Relative: 17 % (ref 12.0–46.0)
Lymphs Abs: 0.7 10*3/uL (ref 0.7–4.0)
MCHC: 33.3 g/dL (ref 30.0–36.0)
MCV: 85.8 fl (ref 78.0–100.0)
Monocytes Absolute: 0.3 10*3/uL (ref 0.1–1.0)
Monocytes Relative: 6.3 % (ref 3.0–12.0)
Neutro Abs: 3.1 10*3/uL (ref 1.4–7.7)
Neutrophils Relative %: 73.2 % (ref 43.0–77.0)
Platelets: 140 10*3/uL — ABNORMAL LOW (ref 150.0–400.0)
RBC: 4.96 Mil/uL (ref 3.87–5.11)
RDW: 14.2 % (ref 11.5–15.5)
WBC: 4.3 10*3/uL (ref 4.0–10.5)

## 2019-02-27 LAB — LIPID PANEL
Cholesterol: 223 mg/dL — ABNORMAL HIGH (ref 0–200)
HDL: 38.5 mg/dL — ABNORMAL LOW (ref >39.00)
NonHDL: 184.1
Total CHOL/HDL Ratio: 6
Triglycerides: 230 mg/dL — ABNORMAL HIGH (ref 0.0–149.0)
VLDL: 46 mg/dL — ABNORMAL HIGH (ref 0.0–40.0)

## 2019-02-27 LAB — BASIC METABOLIC PANEL
BUN: 17 mg/dL (ref 6–23)
CO2: 29 mEq/L (ref 19–32)
Calcium: 9.5 mg/dL (ref 8.4–10.5)
Chloride: 102 mEq/L (ref 96–112)
Creatinine, Ser: 0.81 mg/dL (ref 0.40–1.20)
GFR: 68.37 mL/min (ref >60.00)
Glucose, Bld: 85 mg/dL (ref 70–99)
Potassium: 4 mEq/L (ref 3.5–5.1)
Sodium: 137 mEq/L (ref 135–145)

## 2019-02-27 MED ORDER — CYANOCOBALAMIN 1000 MCG/ML IJ SOLN
1000.0000 ug | Freq: Once | INTRAMUSCULAR | Status: AC
  Administered 2019-02-27: 1000 ug via INTRAMUSCULAR

## 2019-02-27 NOTE — Progress Notes (Signed)
Subjective:  Patient ID: [name], female    DOB: [birth year]  Age: 78 y.o. MRN: [id number]  CC: Hypertension and Coronary Artery Disease   HPI [name] presents for f/up - She is active and denies any recent episodes of CP, DOE, palpitations, edema, or fatigue.  Outpatient Medications Prior to Visit  Medication Sig Dispense Refill  . ALPRAZolam (XANAX) 0.5 MG tablet Take 1 tablet (0.5 mg total) by mouth 3 (three) times daily as needed. for anxiety 90 tablet 0  . brimonidine (ALPHAGAN) 0.15 % ophthalmic solution Place 1 drop into both eyes 3 (three) times daily.  3  . cetirizine (ZYRTEC) 10 MG tablet Take 1 tablet (10 mg total) by mouth daily. 90 tablet 3  . dicyclomine (BENTYL) 10 MG capsule Take 1 capsule (10 mg total) by mouth 4 (four) times daily -  before meals and at bedtime. 360 capsule 1  . dorzolamide (TRUSOPT) 2 % ophthalmic solution Place 1 drop into both eyes 3 (three) times daily.  1  . escitalopram (LEXAPRO) 20 MG tablet Take 1 tablet by mouth once daily 90 tablet 0  . furosemide (LASIX) 20 MG tablet Take 1 tablet (20 mg total) by mouth daily. 90 tablet 0  . NON FORMULARY Vit b12 nasal spray-use weekly    . potassium chloride SA (K-DUR) 20 MEQ tablet Take 1 tablet (20 mEq total) by mouth 2 (two) times daily. 180 tablet 0  . aspirin EC 81 MG EC tablet Take 1 tablet (81 mg total) by mouth daily. (Patient taking differently: Take 81 mg by mouth as needed. ) 30 tablet 0   No facility-administered medications prior to visit.     ROS Review of Systems  Constitutional: Negative for chills, diaphoresis, fatigue and fever.  HENT: Negative.   Eyes: Negative for visual disturbance.  Respiratory: Negative for cough, chest tightness, shortness of breath and wheezing.   Cardiovascular: Negative for chest pain, palpitations and leg swelling.  Gastrointestinal: Negative for abdominal pain, constipation, diarrhea, nausea and vomiting.  Endocrine: Negative.   Genitourinary:  Negative.  Negative for difficulty urinating and dysuria.  Musculoskeletal: Negative.  Negative for arthralgias and myalgias.  Skin: Negative.  Negative for color change and pallor.  Neurological: Negative.  Negative for dizziness, weakness and light-headedness.  Hematological: Negative for adenopathy. Does not bruise/bleed easily.  Psychiatric/Behavioral: Negative for behavioral problems, decreased concentration, dysphoric mood, self-injury and suicidal ideas. The patient is nervous/anxious.     Objective:  BP 130/74 (BP Location: Left Arm, Patient Position: Sitting, Cuff Size: Normal)   Pulse 62   Temp 98.3 F (36.8 C) (Oral)   Resp 16   Ht 5\' 3"  (1.6 m)   Wt 165 lb (74.8 kg)   SpO2 99%   BMI 29.23 kg/m   BP Readings from Last 3 Encounters:  02/27/19 130/74  07/10/18 122/80  06/08/18 128/74    Wt Readings from Last 3 Encounters:  02/27/19 165 lb (74.8 kg)  07/10/18 163 lb 1.9 oz (74 kg)  06/08/18 159 lb 1.9 oz (72.2 kg)    Physical Exam Vitals signs reviewed.  Constitutional:      Appearance: Normal appearance.  HENT:     Nose: Nose normal.     Mouth/Throat:     Mouth: Mucous membranes are moist.  Eyes:     General: No scleral icterus.    Conjunctiva/sclera: Conjunctivae normal.  Neck:     Musculoskeletal: Normal range of motion and neck supple.  Cardiovascular:  Rate and Rhythm: Normal rate and regular rhythm.     Heart sounds: No murmur.  Pulmonary:     Effort: Pulmonary effort is normal.     Breath sounds: No stridor. No wheezing, rhonchi or rales.  Abdominal:     General: Abdomen is protuberant. Bowel sounds are normal. There is no distension.     Palpations: There is no hepatomegaly or splenomegaly.     Tenderness: There is no abdominal tenderness.  Musculoskeletal: Normal range of motion.     Right lower leg: No edema.     Left lower leg: No edema.  Lymphadenopathy:     Cervical: No cervical adenopathy.  Skin:    General: Skin is warm and dry.      Coloration: Skin is not pale.  Neurological:     General: No focal deficit present.     Mental Status: She is alert.  Psychiatric:        Mood and Affect: Mood normal.        Behavior: Behavior normal.     Lab Results  Component Value Date   WBC 4.3 02/27/2019   HGB 14.2 02/27/2019   HCT 42.5 02/27/2019   PLT 140.0 (L) 02/27/2019   GLUCOSE 85 02/27/2019   CHOL 223 (H) 02/27/2019   TRIG 230.0 (H) 02/27/2019   HDL 38.50 (L) 02/27/2019   LDLDIRECT 174.0 02/27/2019   LDLCALC 155 (H) 03/01/2017   ALT 17 02/27/2019   AST 22 02/27/2019   NA 137 02/27/2019   K 4.0 02/27/2019   CL 102 02/27/2019   CREATININE 0.81 02/27/2019   BUN 17 02/27/2019   CO2 29 02/27/2019   TSH 4.05 02/27/2019   INR 0.98 02/28/2017   HGBA1C 4.9 03/01/2017    Dg Chest 2 View  Result Date: 07/10/2017 CLINICAL DATA:  Cough EXAM: CHEST  2 VIEW COMPARISON:  July 06, 2017 FINDINGS: The heart size and mediastinal contours are within normal limits. Minimal scar of bilateral lung bases are noted. There no focal infiltrate, pulmonary edema, or pleural effusion. There is scoliosis of spine. Prior cholecystectomy clips are noted. IMPRESSION: No active cardiopulmonary disease. Minimal scar bilateral lung bases, unchanged. Electronically Signed   By: Abelardo Diesel M.D.   On: 07/10/2017 12:52    Assessment & Plan:   Selin was seen today for hypertension and coronary artery disease.  Diagnoses and all orders for this visit:  Essential hypertension- Her blood pressure is adequately well controlled. -     CBC with Differential/Platelet; Future -     Basic metabolic panel; Future  Coronary artery disease involving native coronary artery of native heart without angina pectoris- She has had no recent episodes of angina.  I will continue to work on risk factor modifications.  She is not willing to take a statin. -     Lipid panel; Future -     Icosapent Ethyl (VASCEPA) 1 g CAPS; Take 2 capsules (2 g total) by  mouth 2 (two) times daily.  Nonalcoholic steatohepatitis (NASH)- Her LFTs are normal now. -     Hepatic function panel; Future  Other vitamin B12 deficiency anemia- I recommended she current continue monthly parenteral B12 replacement therapy. -     CBC with Differential/Platelet; Future -     Cancel: Vitamin B12; Future -     Folate; Future -     cyanocobalamin ((VITAMIN B-12)) injection 1,000 mcg  Hyperlipidemia with target LDL less than 100- She is not willing to take a statin. -  TSH; Future  Vitamin D deficiency -     VITAMIN D 25 Hydroxy (Vit-D Deficiency, Fractures); Future -     Cholecalciferol 50 MCG (2000 UT) TABS; Take 1 tablet (2,000 Units total) by mouth daily.  Need for influenza vaccination -     Flu Vaccine QUAD High Dose(Fluad)  Pure hypertriglyceridemia- I have asked her to start taking icosapent ethyl to lower her triglycerides and for CV risk reduction. -     Icosapent Ethyl (VASCEPA) 1 g CAPS; Take 2 capsules (2 g total) by mouth 2 (two) times daily.   I have discontinued Yasina Dondlinger. Marlin's aspirin. I am also having her start on Cholecalciferol and Vascepa. Additionally, I am having her maintain her brimonidine, dorzolamide, NON FORMULARY, cetirizine, dicyclomine, escitalopram, ALPRAZolam, furosemide, and potassium chloride SA. We administered cyanocobalamin.  Meds ordered this encounter  Medications  . cyanocobalamin ((VITAMIN B-12)) injection 1,000 mcg  . Cholecalciferol 50 MCG (2000 UT) TABS    Sig: Take 1 tablet (2,000 Units total) by mouth daily.    Dispense:  90 tablet    Refill:  1  . Icosapent Ethyl (VASCEPA) 1 g CAPS    Sig: Take 2 capsules (2 g total) by mouth 2 (two) times daily.    Dispense:  360 capsule    Refill:  1     Follow-up: No follow-ups on file.  Scarlette Calico, MD

## 2019-02-28 ENCOUNTER — Encounter: Payer: Self-pay | Admitting: Internal Medicine

## 2019-02-28 DIAGNOSIS — E781 Pure hyperglyceridemia: Secondary | ICD-10-CM | POA: Insufficient documentation

## 2019-02-28 LAB — VITAMIN D 25 HYDROXY (VIT D DEFICIENCY, FRACTURES): VITD: 24.51 ng/mL — ABNORMAL LOW (ref 30.00–100.00)

## 2019-02-28 LAB — FOLATE: Folate: >24.7 ng/mL (ref >5.9)

## 2019-02-28 LAB — LDL CHOLESTEROL, DIRECT: Direct LDL: 174 mg/dL

## 2019-02-28 LAB — TSH: TSH: 4.05 u[IU]/mL (ref 0.35–4.50)

## 2019-02-28 MED ORDER — CHOLECALCIFEROL 50 MCG (2000 UT) PO TABS: 1.0000 | ORAL_TABLET | Freq: Every day | ORAL | 1 refills | Status: DC

## 2019-02-28 MED ORDER — VASCEPA 1 G PO CAPS: 2.0000 | ORAL_CAPSULE | Freq: Two times a day (BID) | ORAL | 1 refills | Status: DC

## 2019-02-28 NOTE — Patient Instructions (Signed)
High Triglycerides Eating Plan Triglycerides are a type of fat in the blood. High levels of triglycerides can increase your risk of heart disease and stroke. If your triglyceride levels are high, choosing the right foods can help lower your triglycerides and keep your heart healthy. Work with your health care provider or a diet and nutrition specialist (dietitian) to develop an eating plan that is right for you. What are tips for following this plan? General guidelines   Lose weight, if you are overweight. For most people, losing 5-10 lbs (2-5 kg) helps lower triglyceride levels. A weight-loss plan may include. ? 30 minutes of exercise at least 5 days a week. ? Reducing the amount of calories, sugar, and fat you eat.  Eat a wide variety of fresh fruits, vegetables, and whole grains. These foods are high in fiber.  Eat foods that contain healthy fats, such as fatty fish, nuts, seeds, and olive oil.  Avoid foods that are high in added sugar, added salt (sodium), saturated fat, and trans fat.  Avoid low-fiber, refined carbohydrates such as white bread, crackers, noodles, and white rice.  Avoid foods with partially hydrogenated oils (trans fats), such as fried foods or stick margarine.  Limit alcohol intake to no more than 1 drink a day for nonpregnant women and 2 drinks a day for men. One drink equals 12 oz of beer, 5 oz of wine, or 1 oz of hard liquor. Your health care provider may recommend that you drink less depending on your overall health. Reading food labels  Check food labels for the amount of saturated fat. Choose foods with no or very little saturated fat.  Check food labels for the amount of trans fat. Choose foods with no trans fat.  Check food labels for the amount of cholesterol. Choose foods low in cholesterol. Ask your dietitian how much cholesterol you should have each day.  Check food labels for the amount of sodium. Choose foods with less than 140 milligrams (mg) per  serving. Shopping  Buy dairy products labeled as nonfat (skim) or low-fat (1%).  Avoid buying processed or prepackaged foods. These are often high in added sugar, sodium, and fat. Cooking  Choose healthy fats when cooking, such as olive oil or canola oil.  Cook foods using lower fat methods, such as baking, broiling, boiling, or grilling.  Make your own sauces, dressings, and marinades when possible, instead of buying them. Store-bought sauces, dressings, and marinades are often high in sodium and sugar. Meal planning  Eat more home-cooked food and less restaurant, buffet, and fast food.  Eat fatty fish at least 2 times each week. Examples of fatty fish include salmon, trout, mackerel, tuna, and herring.  If you eat whole eggs, do not eat more than 3 egg yolks per week. What foods are recommended? The items listed may not be a complete list. Talk with your dietitian about what dietary choices are best for you. Grains Whole wheat or whole grain breads, crackers, cereals, and pasta. Unsweetened oatmeal. Bulgur. Barley. Quinoa. Brown rice. Whole wheat flour tortillas. Vegetables Fresh or frozen vegetables. Low-sodium canned vegetables. Fruits All fresh, canned (in natural juice), or frozen fruits. Meats and other protein foods Skinless chicken or turkey. Ground chicken or turkey. Lean cuts of pork, trimmed of fat. Fish and seafood, especially salmon, trout, and herring. Egg whites. Dried beans, peas, or lentils. Unsalted nuts or seeds. Unsalted canned beans. Natural peanut or almond butter. Dairy Low-fat dairy products. Skim or low-fat (1%) milk. Reduced fat (  2%) and low-sodium cheese. Low-fat ricotta cheese. Low-fat cottage cheese. Plain, low-fat yogurt. Fats and oils Tub margarine without trans fats. Light or reduced-fat mayonnaise. Light or reduced-fat salad dressings. Avocado. Safflower, olive, sunflower, soybean, and canola oils. What foods are not recommended? The items listed  may not be a complete list. Talk with your dietitian about what dietary choices are best for you. Grains White bread. White (regular) pasta. White rice. Cornbread. Bagels. Pastries. Crackers that contain trans fat. Vegetables Creamed or fried vegetables. Vegetables in a cheese sauce. Fruits Sweetened dried fruit. Canned fruit in syrup. Fruit juice. Meats and other protein foods Fatty cuts of meat. Ribs. Chicken wings. Bacon. Sausage. Bologna. Salami. Chitterlings. Fatback. Hot dogs. Bratwurst. Packaged lunch meats. Dairy Whole or reduced-fat (2%) milk. Half-and-half. Cream cheese. Full-fat or sweetened yogurt. Full-fat cheese. Nondairy creamers. Whipped toppings. Processed cheese or cheese spreads. Cheese curds. Beverages Alcohol. Sweetened drinks, such as soda, lemonade, fruit drinks, or punches. Fats and oils Butter. Stick margarine. Lard. Shortening. Ghee. Bacon fat. Tropical oils, such as coconut, palm kernel, or palm oils. Sweets and desserts Corn syrup. Sugars. Honey. Molasses. Candy. Jam and jelly. Syrup. Sweetened cereals. Cookies. Pies. Cakes. Donuts. Muffins. Ice cream. Condiments Store-bought sauces, dressings, and marinades that are high in sugar, such as ketchup and barbecue sauce. Summary  High levels of triglycerides can increase the risk of heart disease and stroke. Choosing the right foods can help lower your triglycerides.  Eat plenty of fresh fruits, vegetables, and whole grains. Choose low-fat dairy and lean meats. Eat fatty fish at least twice a week.  Avoid processed and prepackaged foods with added sugar, sodium, saturated fat, and trans fat.  If you need suggestions or have questions about what types of food are good for you, talk with your health care provider or a dietitian. This information is not intended to replace advice given to you by your health care provider. Make sure you discuss any questions you have with your health care provider. Document Released:  03/24/2004 Document Revised: 05/19/2017 Document Reviewed: 08/09/2016 Elsevier Patient Education  2020 [employer]  

## 2019-03-08 DIAGNOSIS — H401132 Primary open-angle glaucoma, bilateral, moderate stage: Secondary | ICD-10-CM | POA: Diagnosis not present

## 2019-03-08 DIAGNOSIS — H04121 Dry eye syndrome of right lacrimal gland: Secondary | ICD-10-CM | POA: Diagnosis not present

## 2019-03-08 DIAGNOSIS — H04122 Dry eye syndrome of left lacrimal gland: Secondary | ICD-10-CM | POA: Diagnosis not present

## 2019-04-17 ENCOUNTER — Other Ambulatory Visit: Payer: Self-pay | Admitting: Internal Medicine

## 2019-04-17 DIAGNOSIS — F411 Generalized anxiety disorder: Secondary | ICD-10-CM

## 2019-05-20 ENCOUNTER — Other Ambulatory Visit: Payer: Self-pay | Admitting: Internal Medicine

## 2019-05-20 ENCOUNTER — Other Ambulatory Visit: Payer: Self-pay | Admitting: Family

## 2019-05-20 DIAGNOSIS — I10 Essential (primary) hypertension: Secondary | ICD-10-CM

## 2019-05-31 DIAGNOSIS — H04123 Dry eye syndrome of bilateral lacrimal glands: Secondary | ICD-10-CM | POA: Diagnosis not present

## 2019-05-31 DIAGNOSIS — H401132 Primary open-angle glaucoma, bilateral, moderate stage: Secondary | ICD-10-CM | POA: Diagnosis not present

## 2019-08-05 ENCOUNTER — Ambulatory Visit (INDEPENDENT_AMBULATORY_CARE_PROVIDER_SITE_OTHER): Payer: PPO | Admitting: Internal Medicine

## 2019-08-05 ENCOUNTER — Other Ambulatory Visit: Payer: Self-pay

## 2019-08-05 ENCOUNTER — Encounter: Payer: Self-pay | Admitting: Internal Medicine

## 2019-08-05 VITALS — BP 144/82 | HR 60 | Temp 97.8°F | Resp 16 | Ht 63.0 in | Wt 164.4 lb

## 2019-08-05 DIAGNOSIS — R300 Dysuria: Secondary | ICD-10-CM

## 2019-08-05 DIAGNOSIS — N3000 Acute cystitis without hematuria: Secondary | ICD-10-CM | POA: Insufficient documentation

## 2019-08-05 LAB — POC URINALSYSI DIPSTICK (AUTOMATED)
Bilirubin, UA: NEGATIVE
Blood, UA: NEGATIVE
Glucose, UA: NEGATIVE
Ketones, UA: NEGATIVE
Nitrite, UA: NEGATIVE
Protein, UA: NEGATIVE
Spec Grav, UA: 1.015 (ref 1.010–1.025)
Urobilinogen, UA: 0.2 E.U./dL
pH, UA: 6 (ref 5.0–8.0)

## 2019-08-05 MED ORDER — NITROFURANTOIN MONOHYD MACRO 100 MG PO CAPS: 100.0000 mg | ORAL_CAPSULE | Freq: Two times a day (BID) | ORAL | 0 refills | Status: AC

## 2019-08-05 NOTE — Progress Notes (Signed)
Subjective:  Patient ID: [name], female    DOB: October 11, 1940  Age: 79 y.o. MRN: [id number]  CC: Urinary Tract Infection  This visit occurred during the SARS-CoV-2 public health emergency.  Safety protocols were in place, including screening questions prior to the visit, additional usage of staff PPE, and extensive cleaning of exam room while observing appropriate contact time as indicated for disinfecting solutions.    HPI [name] presents for concerns about a 1 week history of dysuria, urinary frequency, urinary urgency, and diffuse pelvic pain.  Outpatient Medications Prior to Visit  Medication Sig Dispense Refill  . ALPRAZolam (XANAX) 0.5 MG tablet TAKE 1 TABLET BY MOUTH THREE TIMES DAILY AS NEEDED FOR ANXIETY 90 tablet 5  . brimonidine (ALPHAGAN) 0.15 % ophthalmic solution Place 1 drop into both eyes 3 (three) times daily.  3  . cetirizine (ZYRTEC) 10 MG tablet Take 1 tablet (10 mg total) by mouth daily. 90 tablet 3  . Cholecalciferol (VITAMIN D) 50 MCG (2000 UT) CAPS Take 1 capsule by mouth daily.    . Cholecalciferol 50 MCG (2000 UT) TABS Take 1 tablet (2,000 Units total) by mouth daily. 90 tablet 1  . dicyclomine (BENTYL) 10 MG capsule Take 1 capsule (10 mg total) by mouth 4 (four) times daily -  before meals and at bedtime. 360 capsule 1  . dorzolamide (TRUSOPT) 2 % ophthalmic solution Place 1 drop into both eyes 3 (three) times daily.  1  . escitalopram (LEXAPRO) 20 MG tablet Take 1 tablet by mouth once daily 90 tablet 0  . furosemide (LASIX) 20 MG tablet Take 1 tablet by mouth once daily 90 tablet 1  . Icosapent Ethyl (VASCEPA) 1 g CAPS Take 2 capsules (2 g total) by mouth 2 (two) times daily. 360 capsule 1  . NON FORMULARY Vit b12 nasal spray-use weekly    . potassium chloride SA (KLOR-CON) 20 MEQ tablet Take 1 tablet by mouth twice daily 180 tablet 1   No facility-administered medications prior to visit.    ROS Review of Systems  Constitutional: Negative for  chills, fatigue and fever.  HENT: Negative.   Eyes: Negative for visual disturbance.  Respiratory: Negative for cough, chest tightness, shortness of breath and wheezing.   Cardiovascular: Negative for chest pain, palpitations and leg swelling.  Gastrointestinal: Negative for abdominal pain, blood in stool, constipation, diarrhea, nausea and vomiting.  Endocrine: Negative.   Genitourinary: Positive for dysuria, frequency, pelvic pain and urgency. Negative for decreased urine volume, difficulty urinating, flank pain, hematuria, vaginal discharge and vaginal pain.  Musculoskeletal: Negative.  Negative for back pain.  Skin: Negative.  Negative for color change and pallor.  Neurological: Negative.  Negative for dizziness.  Hematological: Negative.  Negative for adenopathy. Does not bruise/bleed easily.    Objective:  BP (!) 144/82 (BP Location: Left Arm, Patient Position: Sitting, Cuff Size: Normal)   Pulse 60   Temp 97.8 F (36.6 C) (Oral)   Resp 16   Ht 5\' 3"  (1.6 m)   Wt 164 lb 6 oz (74.6 kg)   SpO2 97%   BMI 29.12 kg/m   BP Readings from Last 3 Encounters:  08/05/19 (!) 144/82  02/27/19 130/74  07/10/18 122/80    Wt Readings from Last 3 Encounters:  08/05/19 164 lb 6 oz (74.6 kg)  02/27/19 165 lb (74.8 kg)  07/10/18 163 lb 1.9 oz (74 kg)    Physical Exam Vitals reviewed.  Constitutional:      Appearance: Normal  appearance.  HENT:     Nose: Nose normal.     Mouth/Throat:     Mouth: Mucous membranes are moist.  Eyes:     General: No scleral icterus.    Conjunctiva/sclera: Conjunctivae normal.  Cardiovascular:     Rate and Rhythm: Normal rate and regular rhythm.     Heart sounds: No murmur.  Pulmonary:     Effort: Pulmonary effort is normal.     Breath sounds: No stridor. No wheezing, rhonchi or rales.  Abdominal:     General: Abdomen is flat. Bowel sounds are normal. There is no distension.     Palpations: There is no hepatomegaly, splenomegaly or mass.      Tenderness: There is abdominal tenderness in the suprapubic area. There is no guarding or rebound.  Musculoskeletal:        General: Normal range of motion.     Cervical back: Neck supple.  Lymphadenopathy:     Cervical: No cervical adenopathy.  Skin:    General: Skin is warm and dry.     Coloration: Skin is not pale.  Neurological:     General: No focal deficit present.     Mental Status: She is alert.  Psychiatric:        Behavior: Behavior normal.     Lab Results  Component Value Date   WBC 4.3 02/27/2019   HGB 14.2 02/27/2019   HCT 42.5 02/27/2019   PLT 140.0 (L) 02/27/2019   GLUCOSE 85 02/27/2019   CHOL 223 (H) 02/27/2019   TRIG 230.0 (H) 02/27/2019   HDL 38.50 (L) 02/27/2019   LDLDIRECT 174.0 02/27/2019   LDLCALC 155 (H) 03/01/2017   ALT 17 02/27/2019   AST 22 02/27/2019   NA 137 02/27/2019   K 4.0 02/27/2019   CL 102 02/27/2019   CREATININE 0.81 02/27/2019   BUN 17 02/27/2019   CO2 29 02/27/2019   TSH 4.05 02/27/2019   INR 0.98 02/28/2017   HGBA1C 4.9 03/01/2017    DG Chest 2 View  Result Date: 07/10/2017 CLINICAL DATA:  Cough EXAM: CHEST  2 VIEW COMPARISON:  July 06, 2017 FINDINGS: The heart size and mediastinal contours are within normal limits. Minimal scar of bilateral lung bases are noted. There no focal infiltrate, pulmonary edema, or pleural effusion. There is scoliosis of spine. Prior cholecystectomy clips are noted. IMPRESSION: No active cardiopulmonary disease. Minimal scar bilateral lung bases, unchanged. Electronically Signed   By: Abelardo Diesel M.D.   On: 07/10/2017 12:52            Clarity, UA  clear        Glucose, UA Negative Negative        Bilirubin, UA  negative        Ketones, UA  negative        Spec Grav, UA 1.010 - 1.025 1.015        Blood, UA  negative        pH, UA 5.0 - 8.0 6.0        Protein, UA Negative Negative        Urobilinogen, UA 0.2 or 1.0 E.U./dL 0.2  0.2 R  1.0 R  0.2 R  1.0 R  0.2 R   Nitrite, UA  negative         Leukocytes, UA Negative TraceAbnormal            Assessment & Plan:   Tiersa was seen today for urinary tract infection.  Diagnoses and all  orders for this visit:  Acute cystitis without hematuria- Her symptoms, exam, and urinalysis are consistent with acute bacterial cystitis.  Will empirically treat with nitrofurantoin awaiting the results of urine culture. -     nitrofurantoin, macrocrystal-monohydrate, (MACROBID) 100 MG capsule; Take 1 capsule (100 mg total) by mouth 2 (two) times daily for 5 days.  Dysuria -     POCT Urinalysis Dipstick (Automated) -     CULTURE, URINE COMPREHENSIVE   I am having Zarielle E. Iannuzzi start on nitrofurantoin (macrocrystal-monohydrate). I am also having her maintain her brimonidine, dorzolamide, NON FORMULARY, cetirizine, dicyclomine, Cholecalciferol, Vascepa, ALPRAZolam, potassium chloride SA, escitalopram, furosemide, and Vitamin D.  Meds ordered this encounter  Medications  . nitrofurantoin, macrocrystal-monohydrate, (MACROBID) 100 MG capsule    Sig: Take 1 capsule (100 mg total) by mouth 2 (two) times daily for 5 days.    Dispense:  10 capsule    Refill:  0     Follow-up: Return in about 3 weeks (around 08/26/2019).  Scarlette Calico, MD

## 2019-08-05 NOTE — Patient Instructions (Signed)

## 2019-08-08 ENCOUNTER — Encounter: Payer: Self-pay | Admitting: Internal Medicine

## 2019-08-08 LAB — CULTURE, URINE COMPREHENSIVE

## 2019-08-30 DIAGNOSIS — H04123 Dry eye syndrome of bilateral lacrimal glands: Secondary | ICD-10-CM | POA: Diagnosis not present

## 2019-09-16 ENCOUNTER — Other Ambulatory Visit: Payer: Self-pay | Admitting: Internal Medicine

## 2019-09-23 ENCOUNTER — Telehealth: Payer: Self-pay

## 2019-09-23 MED ORDER — ESCITALOPRAM OXALATE 20 MG PO TABS: 20.0000 mg | ORAL_TABLET | Freq: Every day | ORAL | 1 refills | Status: DC

## 2019-09-23 NOTE — Telephone Encounter (Signed)
erx sent as requested.  

## 2019-09-23 NOTE — Telephone Encounter (Signed)
1.Medication Requested:escitalopram (LEXAPRO) 20 MG tablet  2. Pharmacy (Name, [facility] [address], [place]  3. On Med List: Yes   4. Last Visit with PCP: 2.15.2021   5. Next visit date with PCP: no appt is made at this time     Agent: Please be advised that RX refills may take up to 3 business days. We ask that you follow-up with your pharmacy.

## 2019-09-25 ENCOUNTER — Telehealth: Payer: Self-pay

## 2019-09-25 NOTE — Telephone Encounter (Deleted)
Error

## 2019-10-08 ENCOUNTER — Ambulatory Visit (INDEPENDENT_AMBULATORY_CARE_PROVIDER_SITE_OTHER): Payer: PPO

## 2019-10-08 ENCOUNTER — Encounter: Payer: Self-pay | Admitting: Family

## 2019-10-08 ENCOUNTER — Ambulatory Visit (INDEPENDENT_AMBULATORY_CARE_PROVIDER_SITE_OTHER): Payer: PPO | Admitting: Family

## 2019-10-08 ENCOUNTER — Other Ambulatory Visit: Payer: Self-pay

## 2019-10-08 VITALS — BP 130/82 | HR 65 | Temp 98.3°F | Wt 168.6 lb

## 2019-10-08 DIAGNOSIS — R5383 Other fatigue: Secondary | ICD-10-CM

## 2019-10-08 DIAGNOSIS — M47816 Spondylosis without myelopathy or radiculopathy, lumbar region: Secondary | ICD-10-CM | POA: Diagnosis not present

## 2019-10-08 DIAGNOSIS — R6889 Other general symptoms and signs: Secondary | ICD-10-CM

## 2019-10-08 DIAGNOSIS — R1031 Right lower quadrant pain: Secondary | ICD-10-CM | POA: Diagnosis not present

## 2019-10-08 DIAGNOSIS — Z87440 Personal history of urinary (tract) infections: Secondary | ICD-10-CM

## 2019-10-08 DIAGNOSIS — R102 Pelvic and perineal pain: Secondary | ICD-10-CM | POA: Diagnosis not present

## 2019-10-08 DIAGNOSIS — M4807 Spinal stenosis, lumbosacral region: Secondary | ICD-10-CM | POA: Diagnosis not present

## 2019-10-08 LAB — COMPREHENSIVE METABOLIC PANEL
ALT: 23 U/L (ref 0–35)
AST: 28 U/L (ref 0–37)
Albumin: 4.5 g/dL (ref 3.5–5.2)
Alkaline Phosphatase: 145 U/L — ABNORMAL HIGH (ref 39–117)
BUN: 17 mg/dL (ref 6–23)
CO2: 29 mEq/L (ref 19–32)
Calcium: 9.4 mg/dL (ref 8.4–10.5)
Chloride: 103 mEq/L (ref 96–112)
Creatinine, Ser: 0.79 mg/dL (ref 0.40–1.20)
GFR: 70.26 mL/min (ref >60.00)
Glucose, Bld: 90 mg/dL (ref 70–99)
Potassium: 3.9 mEq/L (ref 3.5–5.1)
Sodium: 139 mEq/L (ref 135–145)
Total Bilirubin: 0.6 mg/dL (ref 0.2–1.2)
Total Protein: 7.2 g/dL (ref 6.0–8.3)

## 2019-10-08 LAB — TSH: TSH: 5.56 u[IU]/mL — ABNORMAL HIGH (ref 0.35–4.50)

## 2019-10-08 LAB — CBC WITH DIFFERENTIAL/PLATELET
Basophils Absolute: 0 10*3/uL (ref 0.0–0.1)
Basophils Relative: 0.5 % (ref 0.0–3.0)
Eosinophils Absolute: 0.1 10*3/uL (ref 0.0–0.7)
Eosinophils Relative: 1.9 % (ref 0.0–5.0)
HCT: 44 % (ref 36.0–46.0)
Hemoglobin: 14.7 g/dL (ref 12.0–15.0)
Lymphocytes Relative: 18.2 % (ref 12.0–46.0)
Lymphs Abs: 0.8 10*3/uL (ref 0.7–4.0)
MCHC: 33.3 g/dL (ref 30.0–36.0)
MCV: 88.1 fl (ref 78.0–100.0)
Monocytes Absolute: 0.3 10*3/uL (ref 0.1–1.0)
Monocytes Relative: 6.9 % (ref 3.0–12.0)
Neutro Abs: 3.2 10*3/uL (ref 1.4–7.7)
Neutrophils Relative %: 72.5 % (ref 43.0–77.0)
Platelets: 148 10*3/uL — ABNORMAL LOW (ref 150.0–400.0)
RBC: 5 Mil/uL (ref 3.87–5.11)
RDW: 14.7 % (ref 11.5–15.5)
WBC: 4.4 10*3/uL (ref 4.0–10.5)

## 2019-10-08 LAB — POCT URINALYSIS DIPSTICK
Bilirubin, UA: NEGATIVE
Blood, UA: NEGATIVE
Glucose, UA: NEGATIVE
Ketones, UA: NEGATIVE
Leukocytes, UA: NEGATIVE
Nitrite, UA: NEGATIVE
Protein, UA: NEGATIVE
Spec Grav, UA: 1.015 (ref 1.010–1.025)
Urobilinogen, UA: 0.2 E.U./dL
pH, UA: 5 (ref 5.0–8.0)

## 2019-10-08 NOTE — Progress Notes (Signed)
[name] is a 79 y.o. female with the following history as recorded in [employer]:  Patient Active Problem List   Diagnosis Date Noted  . Acute cystitis without hematuria 08/05/2019  . Dysuria 08/05/2019  . Pure hypertriglyceridemia 02/28/2019  . Reactive airway disease 06/23/2017  . TIA (transient ischemic attack) 02/28/2017  . Depression with somatization 12/13/2016  . Allergic rhinitis due to pollen 06/21/2016  . Simple chronic bronchitis ([place]) 06/21/2016  . Routine general medical examination at a health care facility 11/29/2015  . GAD (generalized anxiety disorder) 12/02/2014  . IBS (irritable bowel syndrome) 01/20/2014  . B12 deficiency anemia 07/10/2013  . Vitamin D deficiency 01/04/2013  . HTN (hypertension) 07/26/2011  . CAD (coronary artery disease) 07/26/2011  . Nonalcoholic steatohepatitis (NASH) 06/20/2009  . OBESITY 09/08/2007  . OSTEOPENIA 09/07/2007  . Hyperlipidemia with target LDL less than 100 07/30/2007    Current Outpatient Medications  Medication Sig Dispense Refill  . ALPRAZolam (XANAX) 0.5 MG tablet TAKE 1 TABLET BY MOUTH THREE TIMES DAILY AS NEEDED FOR ANXIETY 90 tablet 5  . brimonidine (ALPHAGAN) 0.15 % ophthalmic solution Place 1 drop into both eyes 3 (three) times daily.  3  . cetirizine (ZYRTEC) 10 MG tablet Take 1 tablet (10 mg total) by mouth daily. 90 tablet 3  . Cholecalciferol (VITAMIN D) 50 MCG (2000 UT) CAPS Take 1 capsule by mouth daily.    . Cholecalciferol 50 MCG (2000 UT) TABS Take 1 tablet (2,000 Units total) by mouth daily. 90 tablet 1  . dicyclomine (BENTYL) 10 MG capsule Take 1 capsule (10 mg total) by mouth 4 (four) times daily -  before meals and at bedtime. 360 capsule 1  . dorzolamide (TRUSOPT) 2 % ophthalmic solution Place 1 drop into both eyes 3 (three) times daily.  1  . escitalopram (LEXAPRO) 20 MG tablet Take 1 tablet (20 mg total) by mouth daily. 90 tablet 1  . furosemide (LASIX) 20 MG tablet Take 1 tablet by mouth once  daily 90 tablet 1  . Icosapent Ethyl (VASCEPA) 1 g CAPS Take 2 capsules (2 g total) by mouth 2 (two) times daily. 360 capsule 1  . NON FORMULARY Vit b12 nasal spray-use weekly    . potassium chloride SA (KLOR-CON) 20 MEQ tablet Take 1 tablet by mouth twice daily 180 tablet 1   No current facility-administered medications for this visit.    Allergies: Diltiazem hcl, Statins, Zetia [ezetimibe], Codeine, Sulfonamide derivatives, Tape, and Tramadol  Past Medical History:  Diagnosis Date  . Anxiety state, unspecified   . CAD (coronary artery disease)   . Disorder of bone and cartilage, unspecified   . Diverticulosis of colon (without mention of hemorrhage)   . Dizziness and giddiness   . Fatty liver   . Fibromyalgia   . Obesity, unspecified   . Other and unspecified hyperlipidemia   . Other chronic nonalcoholic liver disease   . Skin cancer    basal cell on face  . Stroke [facility])    TIA  . TIA (transient ischemic attack)   . Unspecified essential hypertension     Past Surgical History:  Procedure Laterality Date  . BACK SURGERY    . CHOLECYSTECTOMY    . MOHS SURGERY    . TONSILLECTOMY AND ADENOIDECTOMY    . TOTAL ABDOMINAL HYSTERECTOMY W/ BILATERAL SALPINGOOPHORECTOMY      Family History  Problem Relation Age of Onset  . Stomach cancer Mother   . Cancer Brother   . Breast cancer Sister   .  Other Father   . Breast cancer Sister     Social History   Tobacco Use  . Smoking status: Never Smoker  . Smokeless tobacco: Never Used  Substance Use Topics  . Alcohol use: No    Subjective:  Complaining of cold intolerance x 2-3 months; seemed to start after treatment for UTI in February- "feel like I just stay cold." Not currently having any burning or frequency with urination;  Also complaining of right sided pelvic pain- intermittently on and off x 1 month; symptoms have been more noticeable in the past week; notes that bowel movements have been normal; s/p total hysterectomy; s/p  cholecystectomy; does still have appendix; is eating normally- no nausea, vomiting or changes in bowel movements; no concern dark stools;    Objective:  Vitals:   10/08/19 1152  BP: 130/82  Pulse: 65  Temp: 98.3 F (36.8 C)  TempSrc: Oral  SpO2: 96%  Weight: 168 lb 9.6 oz (76.5 kg)    General: Well developed, well nourished, in no acute distress  Skin : Warm and dry.  Head: Normocephalic and atraumatic  Lungs: Respirations unlabored; clear to auscultation bilaterally without wheeze, rales, rhonchi  CVS exam: normal rate and regular rhythm.  Abdomen: Soft; nontender; nondistended; normoactive bowel sounds; no masses or hepatosplenomegaly; RLQ not tender to palpation Musculoskeletal: No deformities; no active joint inflammation  Extremities: No edema, cyanosis, clubbing  Vessels: Symmetric bilaterally  Neurologic: Alert and oriented; speech intact; face symmetrical; moves all extremities well; CNII-XII intact without focal deficit    Assessment:  1. Other fatigue   2. Cold intolerance   3. History of UTI   4. Pelvic pain     Plan:  Will update labs and lumbar X-ray today; U/A is normal- no signs of UTI but will check urine culture; will also check abd/ pelvic CT due to chronic RLQ pain; follow-up to be determined;  This visit occurred during the SARS-CoV-2 public health emergency.  Safety protocols were in place, including screening questions prior to the visit, additional usage of staff PPE, and extensive cleaning of exam room while observing appropriate contact time as indicated for disinfecting solutions.      No follow-ups on file.  Orders Placed This Encounter  Procedures  . Urine Culture  . DG Lumbar Spine Complete    Standing Status:   Future    Number of Occurrences:   1    Standing Expiration Date:   12/07/2020    Order Specific Question:   Reason for Exam (SYMPTOM  OR DIAGNOSIS REQUIRED)    Answer:   right sided pelvic pain    Order Specific Question:    Preferred imaging location?    Answer:   [facility]    Order Specific Question:   Radiology Contrast Protocol - do NOT remove file path    Answer:   [email or web address]  . CBC with Differential/Platelet  . Comp Met (CMET)  . TSH  . POCT Urinalysis Dipstick    Requested Prescriptions    No prescriptions requested or ordered in this encounter

## 2019-10-09 ENCOUNTER — Other Ambulatory Visit: Payer: Self-pay | Admitting: Internal Medicine

## 2019-10-09 ENCOUNTER — Other Ambulatory Visit: Payer: Self-pay | Admitting: Family

## 2019-10-09 DIAGNOSIS — E559 Vitamin D deficiency, unspecified: Secondary | ICD-10-CM

## 2019-10-09 MED ORDER — LEVOTHYROXINE SODIUM 25 MCG PO TABS: 25.0000 ug | ORAL_TABLET | Freq: Every day | ORAL | 0 refills | Status: DC

## 2019-10-09 NOTE — Progress Notes (Signed)
Reviewed with patient; will start low dose Synthroid 25 mcg; follow-up in office in 6 weeks/ sooner prn. Risks/ benefits of medication discussed.

## 2019-10-09 NOTE — Progress Notes (Signed)
Reviewed with patient; she does not want to see sports medicine at this time; will continue to use Tylenol as needed and let us know if she wants to see specialist at later date.

## 2019-10-23 ENCOUNTER — Telehealth: Payer: Self-pay | Admitting: Internal Medicine

## 2019-10-23 ENCOUNTER — Other Ambulatory Visit: Payer: PPO

## 2019-10-23 NOTE — Progress Notes (Signed)
  Chronic Care Management   Outreach Note  10/23/2019 Name: TYNSLEY MILDE MRN: [id number] DOB: 30-Dec-1940  Referred by: Janith Lima, MD Reason for referral : No chief complaint on file.   An unsuccessful telephone outreach was attempted today. The patient was referred to the pharmacist for assistance with care management and care coordination.    This note is not being shared with the patient for the following reason: To respect privacy (The patient or proxy has requested that the information not be shared).  Follow Up Plan:   Raynicia Dukes [employer] Scheduler

## 2019-10-28 ENCOUNTER — Inpatient Hospital Stay: Admission: RE | Admit: 2019-10-28 | Payer: PPO | Source: Ambulatory Visit

## 2019-10-28 ENCOUNTER — Ambulatory Visit
Admission: RE | Admit: 2019-10-28 | Discharge: 2019-10-28 | Disposition: A | Discharge status: Home / Self Care | Payer: PPO | Source: Ambulatory Visit | Attending: Family | Admitting: Family

## 2019-10-28 ENCOUNTER — Other Ambulatory Visit: Payer: Self-pay

## 2019-10-28 ENCOUNTER — Other Ambulatory Visit: Payer: Self-pay | Admitting: Family

## 2019-10-28 DIAGNOSIS — R109 Unspecified abdominal pain: Secondary | ICD-10-CM | POA: Diagnosis not present

## 2019-10-28 DIAGNOSIS — N2889 Other specified disorders of kidney and ureter: Secondary | ICD-10-CM

## 2019-10-28 DIAGNOSIS — R102 Pelvic and perineal pain: Secondary | ICD-10-CM

## 2019-10-28 DIAGNOSIS — R1031 Right lower quadrant pain: Secondary | ICD-10-CM

## 2019-10-28 MED ORDER — IOPAMIDOL (ISOVUE-300) INJECTION 61%
100.0000 mL | Freq: Once | INTRAVENOUS | Status: AC | PRN
  Administered 2019-10-28: 100 mL via INTRAVENOUS

## 2019-11-25 ENCOUNTER — Other Ambulatory Visit: Payer: Self-pay | Admitting: Internal Medicine

## 2019-11-25 DIAGNOSIS — I10 Essential (primary) hypertension: Secondary | ICD-10-CM

## 2019-11-25 DIAGNOSIS — F411 Generalized anxiety disorder: Secondary | ICD-10-CM

## 2019-11-25 MED ORDER — ALPRAZOLAM 0.5 MG PO TABS: 0.5000 mg | ORAL_TABLET | Freq: Three times a day (TID) | ORAL | 0 refills | Status: DC | PRN

## 2019-11-25 MED ORDER — POTASSIUM CHLORIDE CRYS ER 20 MEQ PO TBCR: 20.0000 meq | EXTENDED_RELEASE_TABLET | Freq: Two times a day (BID) | ORAL | 0 refills | Status: DC

## 2019-11-25 NOTE — Telephone Encounter (Signed)
Patient contacted and has scheduled an appointment for Monday.   Pt wanted to know if she can get a refill of potassium and alprazolam.   Pt also wanted to know if she is able to see you for the med follow up and for the MRI follow up.

## 2019-11-26 ENCOUNTER — Other Ambulatory Visit: Payer: Self-pay

## 2019-11-26 ENCOUNTER — Ambulatory Visit
Admission: RE | Admit: 2019-11-26 | Discharge: 2019-11-26 | Disposition: A | Discharge status: Home / Self Care | Payer: PPO | Source: Ambulatory Visit | Attending: Family | Admitting: Family

## 2019-11-26 DIAGNOSIS — N2889 Other specified disorders of kidney and ureter: Secondary | ICD-10-CM

## 2019-11-26 MED ORDER — GADOBENATE DIMEGLUMINE 529 MG/ML IV SOLN
15.0000 mL | Freq: Once | INTRAVENOUS | Status: AC | PRN
  Administered 2019-11-26: 15 mL via INTRAVENOUS

## 2019-11-26 NOTE — Telephone Encounter (Signed)
Per PCP rx's has been sent. Pt contacted and informed of same.

## 2019-11-29 ENCOUNTER — Other Ambulatory Visit: Payer: Self-pay | Admitting: Family

## 2019-11-29 ENCOUNTER — Ambulatory Visit: Payer: PPO | Admitting: Family

## 2019-11-29 ENCOUNTER — Telehealth: Payer: Self-pay | Admitting: Internal Medicine

## 2019-11-29 DIAGNOSIS — N281 Cyst of kidney, acquired: Secondary | ICD-10-CM

## 2019-11-29 DIAGNOSIS — I10 Essential (primary) hypertension: Secondary | ICD-10-CM

## 2019-11-29 NOTE — Progress Notes (Signed)
  Chronic Care Management   Outreach Note  11/29/2019 Name: JACINDA KANADY MRN: [id number] DOB: February 23, 1941  Referred by: Janith Lima, MD Reason for referral : Chronic Care Management (INITIAL CCM OUTREACH)    Chronic Care Management   Note  11/29/2019 Name: WREATHA STURGEON MRN: [id number] DOB: 05/20/1941  AMMI HUTT is a 79 y.o. year old female who is a primary care patient of Janith Lima, MD. I reached out to Asencion Islam by phone today in response to a referral sent by Ms. Gean Maidens Affinito's PCP, Janith Lima, MD.   Ms. Barkett was given information about Chronic Care Management services today including:  1. CCM service includes personalized support from designated clinical staff supervised by her physician, including individualized plan of care and coordination with other care providers 2. 24/7 contact phone numbers for assistance for urgent and routine care needs. 3. Service will only be billed when office clinical staff spend 20 minutes or more in a month to coordinate care. 4. Only one practitioner may furnish and bill the service in a calendar month. 5. The patient may stop CCM services at any time (effective at the end of the month) by phone call to the office staff.   Patient agreed to services and verbal consent obtained.  This note is not being shared with the patient for the following reason: To respect privacy (The patient or proxy has requested that the information not be shared). Follow up plan:   Earney [place] [employer] Scheduler  Follow Up Plan:

## 2019-12-02 ENCOUNTER — Ambulatory Visit (INDEPENDENT_AMBULATORY_CARE_PROVIDER_SITE_OTHER): Payer: PPO | Admitting: Internal Medicine

## 2019-12-02 ENCOUNTER — Other Ambulatory Visit: Payer: Self-pay

## 2019-12-02 ENCOUNTER — Ambulatory Visit (INDEPENDENT_AMBULATORY_CARE_PROVIDER_SITE_OTHER): Payer: PPO

## 2019-12-02 ENCOUNTER — Encounter: Payer: Self-pay | Admitting: Internal Medicine

## 2019-12-02 VITALS — BP 134/70 | HR 57 | Temp 98.1°F | Ht 63.0 in | Wt 170.0 lb

## 2019-12-02 DIAGNOSIS — J301 Allergic rhinitis due to pollen: Secondary | ICD-10-CM | POA: Diagnosis not present

## 2019-12-02 DIAGNOSIS — D518 Other vitamin B12 deficiency anemias: Secondary | ICD-10-CM | POA: Diagnosis not present

## 2019-12-02 DIAGNOSIS — R0683 Snoring: Secondary | ICD-10-CM | POA: Diagnosis not present

## 2019-12-02 DIAGNOSIS — I10 Essential (primary) hypertension: Secondary | ICD-10-CM | POA: Diagnosis not present

## 2019-12-02 DIAGNOSIS — I2510 Atherosclerotic heart disease of native coronary artery without angina pectoris: Secondary | ICD-10-CM

## 2019-12-02 DIAGNOSIS — E039 Hypothyroidism, unspecified: Secondary | ICD-10-CM | POA: Diagnosis not present

## 2019-12-02 DIAGNOSIS — F411 Generalized anxiety disorder: Secondary | ICD-10-CM

## 2019-12-02 DIAGNOSIS — Z1159 Encounter for screening for other viral diseases: Secondary | ICD-10-CM

## 2019-12-02 DIAGNOSIS — E781 Pure hyperglyceridemia: Secondary | ICD-10-CM | POA: Diagnosis not present

## 2019-12-02 DIAGNOSIS — E785 Hyperlipidemia, unspecified: Secondary | ICD-10-CM

## 2019-12-02 DIAGNOSIS — Z789 Other specified health status: Secondary | ICD-10-CM

## 2019-12-02 DIAGNOSIS — R1030 Lower abdominal pain, unspecified: Secondary | ICD-10-CM

## 2019-12-02 DIAGNOSIS — Z0001 Encounter for general adult medical examination with abnormal findings: Secondary | ICD-10-CM | POA: Diagnosis not present

## 2019-12-02 DIAGNOSIS — Z0000 Encounter for general adult medical examination without abnormal findings: Secondary | ICD-10-CM | POA: Diagnosis not present

## 2019-12-02 DIAGNOSIS — K582 Mixed irritable bowel syndrome: Secondary | ICD-10-CM | POA: Diagnosis not present

## 2019-12-02 DIAGNOSIS — E559 Vitamin D deficiency, unspecified: Secondary | ICD-10-CM | POA: Diagnosis not present

## 2019-12-02 LAB — CBC WITH DIFFERENTIAL/PLATELET
Basophils Absolute: 0 10*3/uL (ref 0.0–0.1)
Basophils Relative: 0.6 % (ref 0.0–3.0)
Eosinophils Absolute: 0.1 10*3/uL (ref 0.0–0.7)
Eosinophils Relative: 2.2 % (ref 0.0–5.0)
HCT: 42.7 % (ref 36.0–46.0)
Hemoglobin: 14.2 g/dL (ref 12.0–15.0)
Lymphocytes Relative: 20.9 % (ref 12.0–46.0)
Lymphs Abs: 0.9 10*3/uL (ref 0.7–4.0)
MCHC: 33.2 g/dL (ref 30.0–36.0)
MCV: 87.8 fl (ref 78.0–100.0)
Monocytes Absolute: 0.3 10*3/uL (ref 0.1–1.0)
Monocytes Relative: 7 % (ref 3.0–12.0)
Neutro Abs: 3.1 10*3/uL (ref 1.4–7.7)
Neutrophils Relative %: 69.3 % (ref 43.0–77.0)
Platelets: 116 10*3/uL — ABNORMAL LOW (ref 150.0–400.0)
RBC: 4.87 Mil/uL (ref 3.87–5.11)
RDW: 14.3 % (ref 11.5–15.5)
WBC: 4.4 10*3/uL (ref 4.0–10.5)

## 2019-12-02 LAB — HEPATIC FUNCTION PANEL
ALT: 18 U/L (ref 0–35)
AST: 24 U/L (ref 0–37)
Albumin: 4.5 g/dL (ref 3.5–5.2)
Alkaline Phosphatase: 112 U/L (ref 39–117)
Bilirubin, Direct: 0.1 mg/dL (ref 0.0–0.3)
Total Bilirubin: 0.6 mg/dL (ref 0.2–1.2)
Total Protein: 7.2 g/dL (ref 6.0–8.3)

## 2019-12-02 LAB — BASIC METABOLIC PANEL
BUN: 13 mg/dL (ref 6–23)
CO2: 26 mEq/L (ref 19–32)
Calcium: 10 mg/dL (ref 8.4–10.5)
Chloride: 102 mEq/L (ref 96–112)
Creatinine, Ser: 0.88 mg/dL (ref 0.40–1.20)
GFR: 62.01 mL/min (ref >60.00)
Glucose, Bld: 116 mg/dL — ABNORMAL HIGH (ref 70–99)
Potassium: 4.4 mEq/L (ref 3.5–5.1)
Sodium: 139 mEq/L (ref 135–145)

## 2019-12-02 LAB — LIPID PANEL
Cholesterol: 234 mg/dL — ABNORMAL HIGH (ref 0–200)
HDL: 42.5 mg/dL (ref >39.00)
LDL Cholesterol: 167 mg/dL — ABNORMAL HIGH (ref 0–99)
NonHDL: 191.57
Total CHOL/HDL Ratio: 6
Triglycerides: 124 mg/dL (ref 0.0–149.0)
VLDL: 24.8 mg/dL (ref 0.0–40.0)

## 2019-12-02 LAB — FOLATE: Folate: >24.8 ng/mL (ref >5.9)

## 2019-12-02 LAB — TSH: TSH: 2.83 u[IU]/mL (ref 0.35–4.50)

## 2019-12-02 LAB — VITAMIN B12: Vitamin B-12: >1526 pg/mL — ABNORMAL HIGH (ref 211–911)

## 2019-12-02 MED ORDER — DICYCLOMINE HCL 10 MG PO CAPS: 10.0000 mg | ORAL_CAPSULE | Freq: Three times a day (TID) | ORAL | 1 refills | Status: DC

## 2019-12-02 MED ORDER — FUROSEMIDE 20 MG PO TABS: 20.0000 mg | ORAL_TABLET | Freq: Every day | ORAL | 1 refills | Status: DC

## 2019-12-02 MED ORDER — ICOSAPENT ETHYL 1 G PO CAPS: 2.0000 g | ORAL_CAPSULE | Freq: Two times a day (BID) | ORAL | 1 refills | Status: DC

## 2019-12-02 MED ORDER — ALPRAZOLAM 0.5 MG PO TABS: 0.5000 mg | ORAL_TABLET | Freq: Three times a day (TID) | ORAL | 0 refills | Status: DC | PRN

## 2019-12-02 MED ORDER — ESCITALOPRAM OXALATE 20 MG PO TABS: 20.0000 mg | ORAL_TABLET | Freq: Every day | ORAL | 1 refills | Status: DC

## 2019-12-02 MED ORDER — CHOLECALCIFEROL 50 MCG (2000 UT) PO TABS: 2000.0000 [IU] | ORAL_TABLET | Freq: Every day | ORAL | 0 refills | Status: DC

## 2019-12-02 MED ORDER — CETIRIZINE HCL 10 MG PO TABS: 10.0000 mg | ORAL_TABLET | Freq: Every day | ORAL | 3 refills | Status: DC

## 2019-12-02 MED ORDER — POTASSIUM CHLORIDE CRYS ER 20 MEQ PO TBCR: 20.0000 meq | EXTENDED_RELEASE_TABLET | Freq: Two times a day (BID) | ORAL | 0 refills | Status: DC

## 2019-12-02 NOTE — Patient Instructions (Signed)
Health Maintenance, Female Adopting a healthy lifestyle and getting preventive care are important in promoting health and wellness. Ask your health care provider about:  The right schedule for you to have regular tests and exams.  Things you can do on your own to prevent diseases and keep yourself healthy. What should I know about diet, weight, and exercise? Eat a healthy diet   Eat a diet that includes plenty of vegetables, fruits, low-fat dairy products, and lean protein.  Do not eat a lot of foods that are high in solid fats, added sugars, or sodium. Maintain a healthy weight Body mass index (BMI) is used to identify weight problems. It estimates body fat based on height and weight. Your health care provider can help determine your BMI and help you achieve or maintain a healthy weight. Get regular exercise Get regular exercise. This is one of the most important things you can do for your health. Most adults should:  Exercise for at least 150 minutes each week. The exercise should increase your heart rate and make you sweat (moderate-intensity exercise).  Do strengthening exercises at least twice a week. This is in addition to the moderate-intensity exercise.  Spend less time sitting. Even light physical activity can be beneficial. Watch cholesterol and blood lipids Have your blood tested for lipids and cholesterol at 79 years of age, then have this test every 5 years. Have your cholesterol levels checked more often if:  Your lipid or cholesterol levels are high.  You are older than 79 years of age.  You are at high risk for heart disease. What should I know about cancer screening? Depending on your health history and family history, you may need to have cancer screening at various ages. This may include screening for:  Breast cancer.  Cervical cancer.  Colorectal cancer.  Skin cancer.  Lung cancer. What should I know about heart disease, diabetes, and high blood  pressure? Blood pressure and heart disease  High blood pressure causes heart disease and increases the risk of stroke. This is more likely to develop in people who have high blood pressure readings, are of African descent, or are overweight.  Have your blood pressure checked: ? Every 3-5 years if you are 18-39 years of age. ? Every year if you are 40 years old or older. Diabetes Have regular diabetes screenings. This checks your fasting blood sugar level. Have the screening done:  Once every three years after age 40 if you are at a normal weight and have a low risk for diabetes.  More often and at a younger age if you are overweight or have a high risk for diabetes. What should I know about preventing infection? Hepatitis B If you have a higher risk for hepatitis B, you should be screened for this virus. Talk with your health care provider to find out if you are at risk for hepatitis B infection. Hepatitis C Testing is recommended for:  Everyone born from 1945 through 1965.  Anyone with known risk factors for hepatitis C. Sexually transmitted infections (STIs)  Get screened for STIs, including gonorrhea and chlamydia, if: ? You are sexually active and are younger than 79 years of age. ? You are older than 79 years of age and your health care provider tells you that you are at risk for this type of infection. ? Your sexual activity has changed since you were last screened, and you are at increased risk for chlamydia or gonorrhea. Ask your health care provider if   you are at risk.  Ask your health care provider about whether you are at high risk for HIV. Your health care provider may recommend a prescription medicine to help prevent HIV infection. If you choose to take medicine to prevent HIV, you should first get tested for HIV. You should then be tested every 3 months for as long as you are taking the medicine. Pregnancy  If you are about to stop having your period (premenopausal) and  you may become pregnant, seek counseling before you get pregnant.  Take 400 to 800 micrograms (mcg) of folic acid every day if you become pregnant.  Ask for birth control (contraception) if you want to prevent pregnancy. Osteoporosis and menopause Osteoporosis is a disease in which the bones lose minerals and strength with aging. This can result in bone fractures. If you are 65 years old or older, or if you are at risk for osteoporosis and fractures, ask your health care provider if you should:  Be screened for bone loss.  Take a calcium or vitamin D supplement to lower your risk of fractures.  Be given hormone replacement therapy (HRT) to treat symptoms of menopause. Follow these instructions at home: Lifestyle  Do not use any products that contain nicotine or tobacco, such as cigarettes, e-cigarettes, and chewing tobacco. If you need help quitting, ask your health care provider.  Do not use street drugs.  Do not share needles.  Ask your health care provider for help if you need support or information about quitting drugs. Alcohol use  Do not drink alcohol if: ? Your health care provider tells you not to drink. ? You are pregnant, may be pregnant, or are planning to become pregnant.  If you drink alcohol: ? Limit how much you use to 0-1 drink a day. ? Limit intake if you are breastfeeding.  Be aware of how much alcohol is in your drink. In the [place] one drink equals one 12 oz bottle of beer (355 mL), one 5 oz glass of wine (148 mL), or one 1 oz glass of hard liquor (44 mL). General instructions  Schedule regular health, dental, and eye exams.  Stay current with your vaccines.  Tell your health care provider if: ? You often feel depressed. ? You have ever been abused or do not feel safe at home. Summary  Adopting a healthy lifestyle and getting preventive care are important in promoting health and wellness.  Follow your health care provider's instructions about healthy  diet, exercising, and getting tested or screened for diseases.  Follow your health care provider's instructions on monitoring your cholesterol and blood pressure. This information is not intended to replace advice given to you by your health care provider. Make sure you discuss any questions you have with your health care provider. Document Revised: 05/30/2018 Document Reviewed: 05/30/2018 Elsevier Patient Education  2020 [employer]  

## 2019-12-02 NOTE — Patient Instructions (Signed)
[name] , Thank you for taking time to come for your Medicare Wellness Visit. I appreciate your ongoing commitment to your health goals. Please review the following plan we discussed and let me know if I can assist you in the future.   Screening recommendations/referrals: Colonoscopy: 05/02/2018; due 05/02/2021 (every 3 years) Mammogram: 01/23/2015; due every year Bone Density: never done Recommended yearly ophthalmology/optometry visit for glaucoma screening and checkup Recommended yearly dental visit for hygiene and checkup  Vaccinations: Influenza vaccine: 02/27/2019 Pneumococcal vaccine: completed Tdap vaccine: 10/04/2017; due every 10 years  Shingles vaccine: never done   Covid-19: completed (Moderna)  Advanced directives: Please bring a copy of your health care power of attorney and living will to the office at your convenience.  Conditions/risks identified: Continue with personal lifestyle choices: daily care of teeth and gums, regular physical activity (150 minutes a week), eating a healthy diet, avoiding tobacco and drug use, limiting alcohol use, taking a low-dose aspirin (if not allergic) and taking vitamins and minerals as recommended by your physician.  Next appointment: Please schedule your 1 year Medicare Wellness Visit with your Health Coach.  Preventive Care 28 Years and Older, Female Preventive care refers to lifestyle choices and visits with your health care provider that can promote health and wellness. What does preventive care include?  A yearly physical exam. This is also called an annual well check.  Dental exams once or twice a year.  Routine eye exams. Ask your health care provider how often you should have your eyes checked.  Personal lifestyle choices, including:  Daily care of your teeth and gums.  Regular physical activity.  Eating a healthy diet.  Avoiding tobacco and drug use.  Limiting alcohol use.  Practicing safe sex.  Taking low-dose  aspirin every day.  Taking vitamin and mineral supplements as recommended by your health care provider. What happens during an annual well check? The services and screenings done by your health care provider during your annual well check will depend on your age, overall health, lifestyle risk factors, and family history of disease. Counseling  Your health care provider may ask you questions about your:  Alcohol use.  Tobacco use.  Drug use.  Emotional well-being.  Home and relationship well-being.  Sexual activity.  Eating habits.  History of falls.  Memory and ability to understand (cognition).  Work and work Statistician.  Reproductive health. Screening  You may have the following tests or measurements:  Height, weight, and BMI.  Blood pressure.  Lipid and cholesterol levels. These may be checked every 5 years, or more frequently if you are over 59 years old.  Skin check.  Lung cancer screening. You may have this screening every year starting at age 17 if you have a 30-pack-year history of smoking and currently smoke or have quit within the past 15 years.  Fecal occult blood test (FOBT) of the stool. You may have this test every year starting at age 88.  Flexible sigmoidoscopy or colonoscopy. You may have a sigmoidoscopy every 5 years or a colonoscopy every 10 years starting at age 92.  Hepatitis C blood test.  Hepatitis B blood test.  Sexually transmitted disease (STD) testing.  Diabetes screening. This is done by checking your blood sugar (glucose) after you have not eaten for a while (fasting). You may have this done every 1-3 years.  Bone density scan. This is done to screen for osteoporosis. You may have this done starting at age 32.  Mammogram. This may be  done every 1-2 years. Talk to your health care provider about how often you should have regular mammograms. Talk with your health care provider about your test results, treatment options, and if  necessary, the need for more tests. Vaccines  Your health care provider may recommend certain vaccines, such as:  Influenza vaccine. This is recommended every year.  Tetanus, diphtheria, and acellular pertussis (Tdap, Td) vaccine. You may need a Td booster every 10 years.  Zoster vaccine. You may need this after age 58.  Pneumococcal 13-valent conjugate (PCV13) vaccine. One dose is recommended after age 54.  Pneumococcal polysaccharide (PPSV23) vaccine. One dose is recommended after age 27. Talk to your health care provider about which screenings and vaccines you need and how often you need them. This information is not intended to replace advice given to you by your health care provider. Make sure you discuss any questions you have with your health care provider. Document Released: 07/03/2015 Document Revised: 02/24/2016 Document Reviewed: 04/07/2015 Elsevier Interactive Patient Education  2017 [place] Prevention in the Home Falls can cause injuries. They can happen to people of all ages. There are many things you can do to make your home safe and to help prevent falls. What can I do on the outside of my home?  Regularly fix the edges of walkways and driveways and fix any cracks.  Remove anything that might make you trip as you walk through a door, such as a raised step or threshold.  Trim any bushes or trees on the path to your home.  Use bright outdoor lighting.  Clear any walking paths of anything that might make someone trip, such as rocks or tools.  Regularly check to see if handrails are loose or broken. Make sure that both sides of any steps have handrails.  Any raised decks and porches should have guardrails on the edges.  Have any leaves, snow, or ice cleared regularly.  Use sand or salt on walking paths during winter.  Clean up any spills in your garage right away. This includes oil or grease spills. What can I do in the bathroom?  Use night  lights.  Install grab bars by the toilet and in the tub and shower. Do not use towel bars as grab bars.  Use non-skid mats or decals in the tub or shower.  If you need to sit down in the shower, use a plastic, non-slip stool.  Keep the floor dry. Clean up any water that spills on the floor as soon as it happens.  Remove soap buildup in the tub or shower regularly.  Attach bath mats securely with double-sided non-slip rug tape.  Do not have throw rugs and other things on the floor that can make you trip. What can I do in the bedroom?  Use night lights.  Make sure that you have a light by your bed that is easy to reach.  Do not use any sheets or blankets that are too big for your bed. They should not hang down onto the floor.  Have a firm chair that has side arms. You can use this for support while you get dressed.  Do not have throw rugs and other things on the floor that can make you trip. What can I do in the kitchen?  Clean up any spills right away.  Avoid walking on wet floors.  Keep items that you use a lot in easy-to-reach places.  If you need to reach something above you, use  a strong step stool that has a grab bar.  Keep electrical cords out of the way.  Do not use floor polish or wax that makes floors slippery. If you must use wax, use non-skid floor wax.  Do not have throw rugs and other things on the floor that can make you trip. What can I do with my stairs?  Do not leave any items on the stairs.  Make sure that there are handrails on both sides of the stairs and use them. Fix handrails that are broken or loose. Make sure that handrails are as long as the stairways.  Check any carpeting to make sure that it is firmly attached to the stairs. Fix any carpet that is loose or worn.  Avoid having throw rugs at the top or bottom of the stairs. If you do have throw rugs, attach them to the floor with carpet tape.  Make sure that you have a light switch at the  top of the stairs and the bottom of the stairs. If you do not have them, ask someone to add them for you. What else can I do to help prevent falls?  Wear shoes that:  Do not have high heels.  Have rubber bottoms.  Are comfortable and fit you well.  Are closed at the toe. Do not wear sandals.  If you use a stepladder:  Make sure that it is fully opened. Do not climb a closed stepladder.  Make sure that both sides of the stepladder are locked into place.  Ask someone to hold it for you, if possible.  Clearly mark and make sure that you can see:  Any grab bars or handrails.  First and last steps.  Where the edge of each step is.  Use tools that help you move around (mobility aids) if they are needed. These include:  Canes.  Walkers.  Scooters.  Crutches.  Turn on the lights when you go into a dark area. Replace any light bulbs as soon as they burn out.  Set up your furniture so you have a clear path. Avoid moving your furniture around.  If any of your floors are uneven, fix them.  If there are any pets around you, be aware of where they are.  Review your medicines with your doctor. Some medicines can make you feel dizzy. This can increase your chance of falling. Ask your doctor what other things that you can do to help prevent falls. This information is not intended to replace advice given to you by your health care provider. Make sure you discuss any questions you have with your health care provider. Document Released: 04/02/2009 Document Revised: 11/12/2015 Document Reviewed: 07/11/2014 Elsevier Interactive Patient Education  2017 [employer].

## 2019-12-02 NOTE — Progress Notes (Signed)
Subjective:   [name] is a 79 y.o. female who presents for Medicare Annual (Subsequent) preventive examination.  Review of Systems:  NO ROS. Medicare Wellness Visit Cardiac Risk Factors include: advanced age (>34men, >72 women);hypertension;obesity (BMI >30kg/m2)     Objective:     Vitals: BP 134/70   Pulse (!) 57   Temp 98.1 F (36.7 C)   Ht 5\' 3"  (1.6 m)   Wt 170 lb (77.1 kg)   SpO2 96%   BMI 30.11 kg/m   Body mass index is 30.11 kg/m.  Advanced Directives 12/02/2019 07/06/2017 04/11/2017 02/28/2017 02/28/2017 11/29/2015 09/05/2013  Does Patient Have a Medical Advance Directive? Yes No No Yes No Yes Patient does not have advance directive;Patient would not like information  Type of Scientist, forensic Power of [place];Living will - - Living will - [place];Living will -  Does patient want to make changes to medical advance directive? No - Patient declined - Yes (ED - Information included in AVS) No - Patient declined - No - Patient declined -  Copy of [place] in Chart? No - copy requested - - - - Yes -  Would patient like information on creating a medical advance directive? - Yes (ED - Information included in AVS) - - No - Patient declined - -  Pre-existing out of facility DNR order (yellow form or pink MOST form) - - - - - - -    Tobacco Social History   Tobacco Use  Smoking Status Never Smoker  Smokeless Tobacco Never Used     Counseling given: No   Clinical Intake:  Pre-visit preparation completed: Yes  Pain : 0-10 Pain Score: 1  Pain Type: Chronic pain Pain Location: Back Pain Orientation: Lower Pain Radiating Towards: left hip/leg Pain Descriptors / Indicators: Aching Pain Onset: More than a month ago Pain Frequency: Intermittent     BMI - recorded: 30.11 Nutritional Status: BMI > 30  Obese Nutritional Risks: None Diabetes: No  How often do you need to have someone help you when you read  instructions, pamphlets, or other written materials from your doctor or pharmacy?: 1 - Never What is the last grade level you completed in school?: 9th grade  Interpreter Needed?: No  Information entered by :: Ziya Coonrod N. Lowell Guitar, LPN  Past Medical History:  Diagnosis Date  . Anxiety state, unspecified   . CAD (coronary artery disease)   . Disorder of bone and cartilage, unspecified   . Diverticulosis of colon (without mention of hemorrhage)   . Dizziness and giddiness   . Fatty liver   . Fibromyalgia   . Obesity, unspecified   . Other and unspecified hyperlipidemia   . Other chronic nonalcoholic liver disease   . Skin cancer    basal cell on face  . Stroke [facility])    TIA  . TIA (transient ischemic attack)   . Unspecified essential hypertension    Past Surgical History:  Procedure Laterality Date  . BACK SURGERY    . CHOLECYSTECTOMY    . MOHS SURGERY    . TONSILLECTOMY AND ADENOIDECTOMY    . TOTAL ABDOMINAL HYSTERECTOMY W/ BILATERAL SALPINGOOPHORECTOMY     Family History  Problem Relation Age of Onset  . Stomach cancer Mother   . Cancer Brother   . Breast cancer Sister   . Other Father   . Breast cancer Sister    Social History   Socioeconomic History  . Marital status: Married  Spouse name: Thompson Grayer. Freid Sr.  . Number of children: 4  . Years of education: Not on file  . Highest education level: Not on file  Occupational History    Employer: RETIRED  Tobacco Use  . Smoking status: Never Smoker  . Smokeless tobacco: Never Used  Vaping Use  . Vaping Use: Never used  Substance and Sexual Activity  . Alcohol use: No  . Drug use: No  . Sexual activity: Not Currently  Other Topics Concern  . Not on file  Social History Narrative  . Not on file   Social Determinants of Health   Financial Resource Strain:   . Difficulty of Paying Living Expenses:   Food Insecurity:   . Worried About Charity fundraiser in the Last Year:   . Arboriculturist in the Last  Year:   Transportation Needs:   . Film/video editor (Medical):   Marland Kitchen Lack of Transportation (Non-Medical):   Physical Activity:   . Days of Exercise per Week:   . Minutes of Exercise per Session:   Stress:   . Feeling of Stress :   Social Connections:   . Frequency of Communication with Friends and Family:   . Frequency of Social Gatherings with Friends and Family:   . Attends Religious Services:   . Active Member of Clubs or Organizations:   . Attends Archivist Meetings:   Marland Kitchen Marital Status:     Outpatient Encounter Medications as of 12/02/2019  Medication Sig  . brimonidine (ALPHAGAN) 0.15 % ophthalmic solution Place 1 drop into both eyes 3 (three) times daily.  . cetirizine (ZYRTEC) 10 MG tablet Take 1 tablet (10 mg total) by mouth daily.  Marland Kitchen dicyclomine (BENTYL) 10 MG capsule Take 1 capsule (10 mg total) by mouth 4 (four) times daily -  before meals and at bedtime.  . dorzolamide (TRUSOPT) 2 % ophthalmic solution Place 1 drop into both eyes 3 (three) times daily.  Marland Kitchen escitalopram (LEXAPRO) 20 MG tablet Take 1 tablet (20 mg total) by mouth daily.  . furosemide (LASIX) 20 MG tablet Take 1 tablet (20 mg total) by mouth daily.  Marland Kitchen icosapent Ethyl (VASCEPA) 1 g capsule Take 2 capsules (2 g total) by mouth 2 (two) times daily.  Marland Kitchen levothyroxine (SYNTHROID) 25 MCG tablet Take 1 tablet (25 mcg total) by mouth daily before breakfast.  . NON FORMULARY Vit b12 nasal spray-use weekly  . potassium chloride SA (KLOR-CON) 20 MEQ tablet Take 1 tablet (20 mEq total) by mouth 2 (two) times daily.   No facility-administered encounter medications on file as of 12/02/2019.    Activities of Daily Living In your present state of health, do you have any difficulty performing the following activities: 12/02/2019 08/05/2019  Hearing? N N  Vision? N N  Difficulty concentrating or making decisions? N N  Walking or climbing stairs? N N  Dressing or bathing? N N  Doing errands, shopping? N N    Preparing Food and eating ? N -  Using the Toilet? N -  In the past six months, have you accidently leaked urine? N -  Do you have problems with loss of bowel control? Y -  Managing your Medications? N -  Managing your Finances? N -  Housekeeping or managing your Housekeeping? N -  Some recent data might be hidden    Patient Care Team: Janith Lima, MD as PCP - General (Internal Medicine) Charlton Haws, [facility] as Pharmacist (Pharmacist)  Assessment:   This is a routine wellness examination for [place].  Exercise Activities and Dietary recommendations Current Exercise Habits: The patient does not participate in regular exercise at present, Exercise limited by: respiratory conditions(s);psychological condition(s)  Goals    . Continue to take time for myself daily     I will continue to read the bible and enjoy cooking daily. Enjoy life, family and worship God.        Fall Risk Fall Risk  12/02/2019 02/27/2019 01/18/2019 04/11/2017 02/28/2017  Falls in the past year? 0 0 1 No No  Comment - - Emmi Telephone Survey: data to providers prior to load - -  Number falls in past yr: 0 0 1 - -  Comment - - Emmi Telephone Survey Actual Response = 1 - -  Injury with Fall? 0 0 1 - -  Risk for fall due to : No Fall Risks Impaired balance/gait - - -  Follow up Falls evaluation completed;Falls prevention discussed Falls evaluation completed - - -   Is the patient's home free of loose throw rugs in walkways, pet beds, electrical cords, etc?   yes      Grab bars in the bathroom? yes      Handrails on the stairs?   yes      Adequate lighting?   yes  Timed Get Up and Go performed: not indicated  Depression Screen PHQ 2/9 Scores 12/02/2019 12/02/2019 08/05/2019 02/27/2019  PHQ - 2 Score 0 0 0 2  PHQ- 9 Score - - - 4     Cognitive Function MMSE - Mini Mental State Exam 04/11/2017  Orientation to time 5  Orientation to Place 5  Registration 3  Attention/ Calculation 5  Recall 2   Language- name 2 objects 2  Language- repeat 1  Language- follow 3 step command 3  Language- read & follow direction 1  Write a sentence 1  Copy design 1  Total score 29     6CIT Screen 12/02/2019  What Year? 0 points  What month? 0 points  What time? 0 points  Count back from 20 0 points  Months in reverse 2 points  Repeat phrase 2 points  Total Score 4    Immunization History  Administered Date(s) Administered  . Fluad Quad(high Dose 65+) 02/27/2019  . Influenza Split 03/27/2011, 03/30/2012  . Influenza Whole 04/06/2009, 04/12/2010  . Influenza, High Dose Seasonal PF 03/09/2017  . Influenza,inj,Quad PF,6+ Mos 04/18/2013, 02/19/2014, 03/31/2015, 03/04/2016  . Influenza-Unspecified 02/18/2018  . Moderna SARS-COVID-2 Vaccination 07/03/2019, 07/31/2019  . Pneumococcal Conjugate-13 03/18/2016  . Pneumococcal Polysaccharide-23 03/21/2007  . Tdap 10/17/2011, 10/04/2017    Qualifies for Shingles Vaccine? Yes  Screening Tests Health Maintenance  Topic Date Due  . Hepatitis C Screening  Never done  . INFLUENZA VACCINE  01/19/2020  . COLONOSCOPY  05/02/2021  . TETANUS/TDAP  10/05/2027  . COVID-19 Vaccine  Completed  . PNA vac Low Risk Adult  Completed  . DEXA SCAN  Discontinued    Cancer Screenings: Lung: Low Dose CT Chest recommended if Age 44-80 years, 30 pack-year currently smoking OR have quit w/in 15years. Patient does not qualify. Breast:  Up to date on Mammogram? No   Up to date of Bone Density/Dexa? No Colorectal: Yes  Additional Screenings: Hepatitis C Screening: never done     Plan:     Reviewed health maintenance screenings with patient today and relevant education, vaccines, and/or referrals were provided.    Continue doing brain stimulating activities (  puzzles, reading, adult coloring books, staying active) to keep memory sharp.    Continue to eat heart healthy diet (full of fruits, vegetables, whole grains, lean protein, water--limit salt, fat, and  sugar intake) and increase physical activity as tolerated.  I have personally reviewed and noted the following in the patient's chart:   . Medical and social history . Use of alcohol, tobacco or illicit drugs  . Current medications and supplements . Functional ability and status . Nutritional status . Physical activity . Advanced directives . List of other physicians . Hospitalizations, surgeries, and ER visits in previous 12 months . Vitals . Screenings to include cognitive, depression, and falls . Referrals and appointments  In addition, I have reviewed and discussed with patient certain preventive protocols, quality metrics, and best practice recommendations. A written personalized care plan for preventive services as well as general preventive health recommendations were provided to patient.     Sheral Flow, LPN  0/03/2724 Nurse Health Advisor

## 2019-12-02 NOTE — Progress Notes (Signed)
Subjective:  Patient ID: [name], female    DOB: Oct 05, 1940  Age: 79 y.o. MRN: [id number]  CC: Annual Exam, Hypothyroidism, Abdominal Pain, Hyperlipidemia, Depression, Coronary Artery Disease, and Allergic Rhinitis   This visit occurred during the SARS-CoV-2 public health emergency.  Safety protocols were in place, including screening questions prior to the visit, additional usage of staff PPE, and extensive cleaning of exam room while observing appropriate contact time as indicated for disinfecting solutions.    HPI [name] presents for a CPX.  It is difficult to get a history from her but it sounds like she has had a several week history of right lower quadrant pain and left flank pain.  She saw someone else and an MRI of the abdomen was ordered.  She has a lesion in her left kidney that to me appears to be benign but she has been referred to urology to see if it needs to be removed.  She also complains of diarrhea.  She has gotten symptom relief with an antispasmodic.  She denies nausea, vomiting, loss of appetite, weight loss, melena, or bright red blood per rectum.  She recently had an episode of dysuria and was treated for UTI.  Outpatient Medications Prior to Visit  Medication Sig Dispense Refill  . brimonidine (ALPHAGAN) 0.15 % ophthalmic solution Place 1 drop into both eyes 3 (three) times daily.  3  . dorzolamide (TRUSOPT) 2 % ophthalmic solution Place 1 drop into both eyes 3 (three) times daily.  1  . levothyroxine (SYNTHROID) 25 MCG tablet Take 1 tablet (25 mcg total) by mouth daily before breakfast. 90 tablet 0  . NON FORMULARY Vit b12 nasal spray-use weekly    . ALPRAZolam (XANAX) 0.5 MG tablet Take 1 tablet (0.5 mg total) by mouth 3 (three) times daily as needed. for anxiety 90 tablet 0  . cetirizine (ZYRTEC) 10 MG tablet Take 1 tablet (10 mg total) by mouth daily. 90 tablet 3  . Cholecalciferol (VITAMIN D) 50 MCG (2000 UT) CAPS Take 1 capsule by mouth daily.    .  Cholecalciferol 50 MCG (2000 UT) TABS Take 1 capsule by mouth once daily 90 tablet 0  . dicyclomine (BENTYL) 10 MG capsule Take 1 capsule (10 mg total) by mouth 4 (four) times daily -  before meals and at bedtime. 360 capsule 1  . escitalopram (LEXAPRO) 20 MG tablet Take 1 tablet (20 mg total) by mouth daily. 90 tablet 1  . furosemide (LASIX) 20 MG tablet Take 1 tablet by mouth once daily 90 tablet 1  . Icosapent Ethyl (VASCEPA) 1 g CAPS Take 2 capsules (2 g total) by mouth 2 (two) times daily. 360 capsule 1  . potassium chloride SA (KLOR-CON) 20 MEQ tablet Take 1 tablet (20 mEq total) by mouth 2 (two) times daily. 60 tablet 0   No facility-administered medications prior to visit.    ROS Review of Systems  Constitutional: Negative for appetite change, chills, diaphoresis, fatigue and fever.  HENT: Positive for congestion, postnasal drip and rhinorrhea. Negative for facial swelling, nosebleeds, sinus pressure, sneezing and trouble swallowing.   Respiratory: Positive for apnea. Negative for cough, chest tightness, shortness of breath and wheezing.        Her daughters complain about her snoring  Cardiovascular: Negative for chest pain, palpitations and leg swelling.  Gastrointestinal: Positive for abdominal pain and diarrhea. Negative for abdominal distention, anal bleeding, blood in stool, constipation, nausea and vomiting.  Endocrine: Negative for cold intolerance and  heat intolerance.  Genitourinary: Positive for flank pain. Negative for decreased urine volume, difficulty urinating, dysuria, hematuria and urgency.  Musculoskeletal: Negative for arthralgias, back pain, myalgias and neck pain.  Skin: Negative for color change, pallor and rash.  Neurological: Negative.  Negative for dizziness, weakness, light-headedness, numbness and headaches.  Hematological: Negative for adenopathy. Does not bruise/bleed easily.  Psychiatric/Behavioral: Positive for dysphoric mood. Negative for behavioral  problems, confusion, decreased concentration, self-injury, sleep disturbance and suicidal ideas. The patient is nervous/anxious.     Objective:  BP 134/70 (BP Location: Left Arm, Patient Position: Sitting, Cuff Size: Normal)   Pulse (!) 57   Temp 98.1 F (36.7 C) (Oral)   Ht 5\' 3"  (1.6 m)   Wt 170 lb (77.1 kg)   SpO2 96%   BMI 30.11 kg/m   BP Readings from Last 3 Encounters:  12/02/19 134/70  12/02/19 134/70  10/08/19 130/82    Wt Readings from Last 3 Encounters:  12/02/19 170 lb (77.1 kg)  12/02/19 170 lb (77.1 kg)  10/08/19 168 lb 9.6 oz (76.5 kg)    Physical Exam Vitals reviewed.  Constitutional:      General: She is not in acute distress.    Appearance: She is well-developed. She is not ill-appearing, toxic-appearing or diaphoretic.  HENT:     Nose: Nose normal.     Mouth/Throat:     Mouth: Mucous membranes are moist.  Eyes:     General: No scleral icterus.    Conjunctiva/sclera: Conjunctivae normal.  Cardiovascular:     Rate and Rhythm: Normal rate and regular rhythm.     Heart sounds: No murmur heard.   Pulmonary:     Effort: Pulmonary effort is normal.     Breath sounds: No stridor. No wheezing, rhonchi or rales.  Abdominal:     General: Abdomen is protuberant. Bowel sounds are normal. There is no distension.     Palpations: Abdomen is soft. There is no hepatomegaly, splenomegaly or mass.     Tenderness: There is abdominal tenderness in the right lower quadrant.     Hernia: No hernia is present. There is no hernia in the umbilical area, ventral area, left inguinal area, right femoral area, left femoral area or right inguinal area.  Musculoskeletal:        General: Normal range of motion.     Cervical back: Neck supple.     Right lower leg: No edema.     Left lower leg: No edema.  Lymphadenopathy:     Cervical: No cervical adenopathy.  Skin:    General: Skin is warm and dry.     Coloration: Skin is not pale.  Neurological:     General: No focal  deficit present.     Mental Status: She is alert.  Psychiatric:        Mood and Affect: Mood normal.        Behavior: Behavior normal.        Thought Content: Thought content normal.        Judgment: Judgment normal.     Lab Results  Component Value Date   WBC 4.4 12/02/2019   HGB 14.2 12/02/2019   HCT 42.7 12/02/2019   PLT 116.0 (L) 12/02/2019   GLUCOSE 116 (H) 12/02/2019   CHOL 234 (H) 12/02/2019   TRIG 124.0 12/02/2019   HDL 42.50 12/02/2019   LDLDIRECT 174.0 02/27/2019   LDLCALC 167 (H) 12/02/2019   ALT 18 12/02/2019   AST 24 12/02/2019   NA 139  12/02/2019   K 4.4 12/02/2019   CL 102 12/02/2019   CREATININE 0.88 12/02/2019   BUN 13 12/02/2019   CO2 26 12/02/2019   TSH 2.83 12/02/2019   INR 0.98 02/28/2017   HGBA1C 4.9 03/01/2017    MR Abdomen W Wo Contrast  Result Date: 11/27/2019 CLINICAL DATA:  Right lower quadrant abdominal pain. A small complex lesion of the left kidney lower pole was present on CT, for further characterization. EXAM: MRI ABDOMEN WITHOUT AND WITH CONTRAST TECHNIQUE: Multiplanar multisequence MR imaging of the abdomen was performed both before and after the administration of intravenous contrast. CONTRAST:  68mL MULTIHANCE GADOBENATE DIMEGLUMINE 529 MG/ML IV SOLN COMPARISON:  10/28/2019 FINDINGS: Body habitus reduces diagnostic sensitivity and specificity. Despite efforts by the technologist and patient, motion artifact is present on today's exam and could not be eliminated. This reduces exam sensitivity and specificity. Lower chest: Small type 1 hiatal hernia. Hepatobiliary: Cholecystectomy. Mildly dilated extrahepatic biliary tree with the common bile duct up to 1.3 cm in diameter, with conical distal tapering approaching the ampulla, likely physiologic. No well-defined filling defect in the biliary tree. No significant abnormal hepatic lesion observed. Pancreas:  Unremarkable Spleen: The spleen measures 11.7 by 12.6 by 5.8 cm. No focal splenic lesion  identified. Adrenals/Urinary Tract:  Both adrenal glands appear normal. The partially exophytic lesion of the left kidney lower pole measures 1.0 by 0.7 by 0.9 cm and has mildly accentuated precontrast T1 signal characteristics and low T2 signal characteristics. My sense is that this lesion is probably enhancing, for example on image 51/14 which seems to be considerably higher than the signal intensity of the lesion on the precontrast images. The main caveats is that the small size the lesion combine with the motion artifact could generate false-positive for false negative results. Moreover, this lesion has been present since at least 2010, although it was probably smaller in 2010. Stomach/Bowel: Unremarkable Vascular/Lymphatic: Aortoiliac atherosclerotic vascular disease. Portacaval node 1.0 cm in short axis on image 17/5 Other:  No supplemental non-categorized findings. Musculoskeletal: Degenerative disc disease at L5-S1. IMPRESSION: 1. The small lesion of the left kidney lower pole averages about 0.8 cm in size. Some images suggest that this lesion is enhancing. On the other hand, this lesion has been present at least since 2010, although may have enlarged since that time. Complicating matters is the small size of the lesion relative to the degree of motion artifact, and the misregistration on the subtraction images due to motion, which might rise the risk of false positives and false negatives. Possibilities include a small indolent renal cell carcinoma with minimal growth over the last 11 years, versus a complex cyst with false-positive enhancement. Given the small size and indolent nature, either surveillance or more aggressive biopsy/treatment might be considered. 2.  Aortic Atherosclerosis (ICD10-I70.0). Electronically Signed   By: Van Clines M.D.   On: 11/27/2019 14:22    Assessment & Plan:   Glendell was seen today for annual exam, hypothyroidism, abdominal pain, hyperlipidemia, depression,  coronary artery disease and allergic rhinitis .  Diagnoses and all orders for this visit:  Seasonal allergic rhinitis due to pollen -     cetirizine (ZYRTEC) 10 MG tablet; Take 1 tablet (10 mg total) by mouth daily.  Vitamin D deficiency -     Cholecalciferol 50 MCG (2000 UT) TABS; Take 1 tablet (2,000 Units total) by mouth daily.  GAD (generalized anxiety disorder) -     ALPRAZolam (XANAX) 0.5 MG tablet; Take 1 tablet (0.5 mg  total) by mouth 3 (three) times daily as needed. for anxiety -     escitalopram (LEXAPRO) 20 MG tablet; Take 1 tablet (20 mg total) by mouth daily.  Coronary artery disease involving native coronary artery of native heart without angina pectoris- She has had no recent episodes of angina.  She is not willing to take a statin.  Will continue to work on risk factor modifications. -     icosapent Ethyl (VASCEPA) 1 g capsule; Take 2 capsules (2 g total) by mouth 2 (two) times daily. -     Lipid panel; Future -     Lipid panel  Pure hypertriglyceridemia- Her triglycerides remain mildly elevated.  Will continue Vascepa for CV risk reduction. -     icosapent Ethyl (VASCEPA) 1 g capsule; Take 2 capsules (2 g total) by mouth 2 (two) times daily. -     Lipid panel; Future -     Lipid panel  Essential hypertension- Her blood pressure is adequately well controlled.  Electrolytes and renal function are normal. -     furosemide (LASIX) 20 MG tablet; Take 1 tablet (20 mg total) by mouth daily. -     potassium chloride SA (KLOR-CON) 20 MEQ tablet; Take 1 tablet (20 mEq total) by mouth 2 (two) times daily. -     CBC with Differential/Platelet; Future -     Basic metabolic panel; Future -     Basic metabolic panel -     CBC with Differential/Platelet  Lower abdominal pain  Irritable bowel syndrome with both constipation and diarrhea-I think the axis explains her symptoms.  I reassured her and her daughters that I think the lesion in her left kidney will end up being benign.  I  did encourage them to go ahead and see the urologist but I do not anticipate them recommending an excision of the renal lesion. -     dicyclomine (BENTYL) 10 MG capsule; Take 1 capsule (10 mg total) by mouth 4 (four) times daily -  before meals and at bedtime.  Other vitamin B12 deficiency anemia- Her B12 level is high. H/H and folate are normal. -     Vitamin B12; Future -     Folate; Future -     CBC with Differential/Platelet; Future -     CBC with Differential/Platelet -     Folate -     Vitamin B12  Hyperlipidemia with target LDL less than 100- She is not willing to take a statin. -     TSH; Future -     Hepatic function panel; Future -     TSH -     Hepatic function panel  Routine general medical examination at a health care facility- Exam completed, labs reviewed, vaccines reviewed and updated, cancer screenings are up-to-date, patient education was given.  Acquired hypothyroidism- Her TSH is in the normal range.  She will remain on the current dose of levothyroxine. -     TSH; Future -     TSH  Need for hepatitis C screening test -     Hepatitis C antibody; Future -     Hepatitis C antibody  Loud snoring -     Ambulatory referral to Sleep Studies  Statin intolerance- See above   I have changed [name]'s Vascepa to icosapent Ethyl. I have also changed her Cholecalciferol and furosemide. I am also having her maintain her brimonidine, dorzolamide, NON FORMULARY, levothyroxine, cetirizine, ALPRAZolam, escitalopram, potassium chloride SA, and dicyclomine.  Meds ordered this encounter  Medications  . cetirizine (ZYRTEC) 10 MG tablet    Sig: Take 1 tablet (10 mg total) by mouth daily.    Dispense:  90 tablet    Refill:  3  . Cholecalciferol 50 MCG (2000 UT) TABS    Sig: Take 1 tablet (2,000 Units total) by mouth daily.    Dispense:  90 tablet    Refill:  0  . ALPRAZolam (XANAX) 0.5 MG tablet    Sig: Take 1 tablet (0.5 mg total) by mouth 3 (three) times daily as  needed. for anxiety    Dispense:  90 tablet    Refill:  0  . escitalopram (LEXAPRO) 20 MG tablet    Sig: Take 1 tablet (20 mg total) by mouth daily.    Dispense:  90 tablet    Refill:  1  . icosapent Ethyl (VASCEPA) 1 g capsule    Sig: Take 2 capsules (2 g total) by mouth 2 (two) times daily.    Dispense:  360 capsule    Refill:  1  . furosemide (LASIX) 20 MG tablet    Sig: Take 1 tablet (20 mg total) by mouth daily.    Dispense:  90 tablet    Refill:  1  . potassium chloride SA (KLOR-CON) 20 MEQ tablet    Sig: Take 1 tablet (20 mEq total) by mouth 2 (two) times daily.    Dispense:  60 tablet    Refill:  0  . dicyclomine (BENTYL) 10 MG capsule    Sig: Take 1 capsule (10 mg total) by mouth 4 (four) times daily -  before meals and at bedtime.    Dispense:  360 capsule    Refill:  1   In addition to time spent on CPE, I spent 60 minutes in preparing to see the patient by review of recent labs, imaging and procedures, obtaining and reviewing separately obtained history, communicating with the patient and family or caregiver, ordering medications, tests or procedures, and documenting clinical information in the EHR including the differential Dx, treatment, and any further evaluation and other management of 1. Vitamin D deficiency 2. GAD (generalized anxiety disorder) 3. Coronary artery disease involving native coronary artery of native heart without angina pectoris 4. Pure hypertriglyceridemia 5. Essential hypertension 6. Seasonal allergic rhinitis due to pollen 7. Irritable bowel syndrome with both constipation and diarrhea 8. Other vitamin B12 deficiency anemia 9. Hyperlipidemia with target LDL less than 100 10. Acquired hypothyroidism 11. Need for hepatitis C screening test 12. Loud snoring 13. Statin intolerance     Follow-up: Return in about 6 months (around 06/02/2020).  Scarlette Calico, MD

## 2019-12-03 ENCOUNTER — Telehealth: Payer: Self-pay

## 2019-12-03 DIAGNOSIS — Z789 Other specified health status: Secondary | ICD-10-CM | POA: Insufficient documentation

## 2019-12-03 LAB — HEPATITIS C ANTIBODY
Hepatitis C Ab: NONREACTIVE
SIGNAL TO CUT-OFF: 0.05 (ref <1.00)

## 2019-12-03 NOTE — Telephone Encounter (Signed)
Key: [id number]

## 2019-12-06 ENCOUNTER — Telehealth: Payer: Self-pay | Admitting: Internal Medicine

## 2019-12-06 NOTE — Telephone Encounter (Signed)
New message:   Pt's daughter is calling in reference to a call the pt got stating we had made the pt an appt at [facility] urology. Please advise.

## 2019-12-06 NOTE — Telephone Encounter (Signed)
Left detailed message for pt stating that [facility] at [phone] probably called to schedule her an appointment. Also stated that she can call me back if she has any other or additional questions regarding my message.

## 2019-12-18 ENCOUNTER — Encounter: Payer: Self-pay | Admitting: Neurology

## 2019-12-18 ENCOUNTER — Ambulatory Visit: Payer: PPO | Admitting: Neurology

## 2019-12-18 VITALS — BP 137/78 | HR 69 | Ht 63.0 in | Wt 168.0 lb

## 2019-12-18 DIAGNOSIS — G4719 Other hypersomnia: Secondary | ICD-10-CM

## 2019-12-18 DIAGNOSIS — R0681 Apnea, not elsewhere classified: Secondary | ICD-10-CM

## 2019-12-18 DIAGNOSIS — E663 Overweight: Secondary | ICD-10-CM | POA: Diagnosis not present

## 2019-12-18 DIAGNOSIS — R351 Nocturia: Secondary | ICD-10-CM

## 2019-12-18 DIAGNOSIS — G478 Other sleep disorders: Secondary | ICD-10-CM

## 2019-12-18 DIAGNOSIS — R0683 Snoring: Secondary | ICD-10-CM

## 2019-12-18 NOTE — Patient Instructions (Signed)

## 2019-12-18 NOTE — Progress Notes (Signed)
Subjective:    Patient ID: [name] is a 79 y.o. female.  HPI     Star Age, MD, PhD [facility] [address], [place] [address]  Dear Dr. Ronnald Ramp,   I saw your patient, [name], upon your kind request in my sleep clinic today for initial consultation of her sleep disorder, in particular, concern for underlying obstructive sleep apnea.  The patient is unaccompanied today.  As you know, [name] is a 79 year old right-handed woman with an underlying medical history of hypertension, TIA, hyperlipidemia, fibromyalgia, diverticulosis, coronary artery disease, anxiety, and borderline obesity, who reports snoring and excessive daytime somnolence and nonrestorative sleep.  Her daughter has noted pauses in her breathing while patient is asleep.  I reviewed your office note from 12/02/2019.  Her Epworth sleepiness score is 8 out of 24, fatigue severity score is 45 out of 63. She is widowed, lost her husband of 72 years nearly 2 years ago in August due to colon cancer.  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  She has 4 grown children, 1 son and 3 daughters, all close by, in fact 2 of her daughters live on the same street.  Her son has sleep apnea and now his daughter also has sleep apnea.  The patient would be willing to get tested and consider CPAP therapy.  She has had trouble going to sleep for years, takes typically 2 pills of Xanax at night, 0.5 mg each.  She has been on Lexapro since her husband passed away.  She goes to bed generally between 1030 and 11, does not watch TV in the bedroom.  She wakes up on her own around 6, sometimes she has a dull heavy feeling in the head, no actual headache.  She does have nocturia about once or twice per average night.  She has no pets in the household, lives alone, has 4 grandchildren and 9 great-grandchildren and takes care of her 34-month-old great grand daughter in her  home during the week.  She does not smoke or drink alcohol, she drinks very little caffeine, weak coffee, 1 cup in the morning  Her Past Medical History Is Significant For: Past Medical History:  Diagnosis Date  . Anxiety state, unspecified   . CAD (coronary artery disease)   . Disorder of bone and cartilage, unspecified   . Diverticulosis of colon (without mention of hemorrhage)   . Dizziness and giddiness   . Fatty liver   . Fibromyalgia   . Obesity, unspecified   . Other and unspecified hyperlipidemia   . Other chronic nonalcoholic liver disease   . Skin cancer    basal cell on face  . Stroke [facility])    TIA  . TIA (transient ischemic attack)   . Unspecified essential hypertension     Her Past Surgical History Is Significant For: Past Surgical History:  Procedure Laterality Date  . BACK SURGERY    . CHOLECYSTECTOMY    . MOHS SURGERY    . TONSILLECTOMY AND ADENOIDECTOMY    . TOTAL ABDOMINAL HYSTERECTOMY W/ BILATERAL SALPINGOOPHORECTOMY      Her Family History Is Significant For: Family History  Problem Relation Age of Onset  . Stomach cancer Mother   . Cancer Brother   . Breast cancer Sister   . Other Father   . Breast cancer Sister     Her Social History Is Significant For: Social History  Socioeconomic History  . Marital status: Married    Spouse name: Tanya Grayer. Sarin Sr.  . Number of children: 4  . Years of education: Not on file  . Highest education level: Not on file  Occupational History    Employer: RETIRED  Tobacco Use  . Smoking status: Never Smoker  . Smokeless tobacco: Never Used  Vaping Use  . Vaping Use: Never used  Substance and Sexual Activity  . Alcohol use: No  . Drug use: No  . Sexual activity: Not Currently  Other Topics Concern  . Not on file  Social History Narrative  . Not on file   Social Determinants of Health   Financial Resource Strain:   . Difficulty of Paying Living Expenses:   Food Insecurity:   . Worried About  Charity fundraiser in the Last Year:   . Arboriculturist in the Last Year:   Transportation Needs:   . Film/video editor (Medical):   Marland Kitchen Lack of Transportation (Non-Medical):   Physical Activity:   . Days of Exercise per Week:   . Minutes of Exercise per Session:   Stress:   . Feeling of Stress :   Social Connections:   . Frequency of Communication with Friends and Family:   . Frequency of Social Gatherings with Friends and Family:   . Attends Religious Services:   . Active Member of Clubs or Organizations:   . Attends Archivist Meetings:   Marland Kitchen Marital Status:     Her Allergies Are:  Allergies  Allergen Reactions  . Diltiazem Hcl Other (See Comments)    REACTION: pt states it made her dizzy...  . Statins     Muscle aches  . Zetia [Ezetimibe]     "it made me feel bad"  . Codeine Nausea Only  . Sulfonamide Derivatives Hives, Itching and Rash  . Tape Itching  . Tramadol Itching  :   Her Current Medications Are:  Outpatient Encounter Medications as of 12/18/2019  Medication Sig  . ALPRAZolam (XANAX) 0.5 MG tablet Take 1 tablet (0.5 mg total) by mouth 3 (three) times daily as needed. for anxiety  . brimonidine (ALPHAGAN) 0.15 % ophthalmic solution Place 1 drop into both eyes 3 (three) times daily.  . cetirizine (ZYRTEC) 10 MG tablet Take 1 tablet (10 mg total) by mouth daily.  . Cholecalciferol 50 MCG (2000 UT) TABS Take 1 tablet (2,000 Units total) by mouth daily.  Marland Kitchen dicyclomine (BENTYL) 10 MG capsule Take 1 capsule (10 mg total) by mouth 4 (four) times daily -  before meals and at bedtime.  . dorzolamide (TRUSOPT) 2 % ophthalmic solution Place 1 drop into both eyes 3 (three) times daily.  Marland Kitchen escitalopram (LEXAPRO) 20 MG tablet Take 1 tablet (20 mg total) by mouth daily.  . furosemide (LASIX) 20 MG tablet Take 1 tablet (20 mg total) by mouth daily.  Marland Kitchen levothyroxine (SYNTHROID) 25 MCG tablet Take 1 tablet (25 mcg total) by mouth daily before breakfast.  . NON  FORMULARY Vit b12 nasal spray-use weekly  . potassium chloride SA (KLOR-CON) 20 MEQ tablet Take 1 tablet (20 mEq total) by mouth 2 (two) times daily.  Marland Kitchen icosapent Ethyl (VASCEPA) 1 g capsule Take 2 capsules (2 g total) by mouth 2 (two) times daily. (Patient not taking: Reported on 12/18/2019)   No facility-administered encounter medications on file as of 12/18/2019.  :  Review of Systems:  Out of a complete 14 point review of systems,  all are reviewed and negative with the exception of these symptoms as listed below: Review of Systems  Neurological:       Here for sleep consult. No prior sleep study, pt reports she does snore.   Epworth Sleepiness Scale 0= would never doze 1= slight chance of dozing 2= moderate chance of dozing 3= high chance of dozing  Sitting and reading:2 Watching TV:1 Sitting inactive in a public place (ex. Theater or meeting):0 As a passenger in a car for an hour without a break:0 Lying down to rest in the afternoon:2 Sitting and talking to someone:1 Sitting quietly after lunch (no alcohol):2 In a car, while stopped in traffic:0 Total:8     Objective:  Neurological Exam  Physical Exam Physical Examination:   Vitals:   12/18/19 0805  BP: 137/78  Pulse: 69    General Examination: The patient is a very pleasant 79 y.o. female in no acute distress. She appears well-developed and well-nourished and well groomed.   HEENT: Normocephalic, atraumatic, pupils are equal, round and reactive to light, extraocular tracking is good without limitation to gaze excursion or nystagmus noted. Hearing is grossly intact. Face is symmetric with normal facial animation. Speech is clear with no dysarthria noted. There is no hypophonia. There is no lip, neck/head, jaw or voice tremor. Neck is supple with full range of passive and active motion. There are no carotid bruits on auscultation. Oropharynx exam reveals: mild mouth dryness, adequate dental hygiene with several missing  teeth on the bottom, she has a partial plate but does not use it and on the top she only was born with 10 teeth.  She has moderate airway crowding based on small airway entry and redundant soft palate, Mallampati is class III, tonsils are absent.  Tongue protrudes centrally and palate elevates symmetrically, neck circumference is 14-1/2 inches.  She has a minimal overbite.    Chest: Clear to auscultation without wheezing, rhonchi or crackles noted.  Heart: S1+S2+0, regular and normal without murmurs, rubs or gallops noted.   Abdomen: Soft, non-tender and non-distended with normal bowel sounds appreciated on auscultation.  Extremities: There is no pitting edema in the distal lower extremities bilaterally.   Skin: Warm and dry without trophic changes noted.   Musculoskeletal: exam reveals no obvious joint deformities, tenderness or joint swelling or erythema.   Neurologically:  Mental status: The patient is awake, alert and oriented in all 4 spheres. Her immediate and remote memory, attention, language skills and fund of knowledge are appropriate. There is no evidence of aphasia, agnosia, apraxia or anomia. Speech is clear with normal prosody and enunciation. Thought process is linear. Mood is normal and affect is normal.  Cranial nerves II - XII are as described above under HEENT exam.  Motor exam: Normal bulk, strength and tone is noted. There is no tremor, fine motor skills and coordination: grossly intact.  Cerebellar testing: No dysmetria or intention tremor. There is no truncal or gait ataxia.  Sensory exam: intact to light touch in the upper and lower extremities.  Gait, station and balance: She stands easily. No veering to one side is noted. No leaning to one side is noted. Posture is age-appropriate and stance is narrow based. Gait shows normal stride length and normal pace. No problems turning are noted.  Assessment and Plan:   In summary, [name] is a very pleasant 79 y.o.-year  old female with an underlying medical history of hypertension, TIA, hyperlipidemia, fibromyalgia, diverticulosis, coronary artery disease, anxiety, and borderline  obesity, whose history and physical exam are concerning for obstructive sleep apnea (OSA). I had a long chat with the patient about my findings and the diagnosis of OSA, its prognosis and treatment options. We talked about medical treatments, surgical interventions and non-pharmacological approaches. I explained in particular the risks and ramifications of untreated moderate to severe OSA, especially with respect to developing cardiovascular disease down the Road, including congestive heart failure, difficult to treat hypertension, cardiac arrhythmias, or stroke. Even type 2 diabetes has, in part, been linked to untreated OSA. Symptoms of untreated OSA include daytime sleepiness, memory problems, mood irritability and mood disorder such as depression and anxiety, lack of energy, as well as recurrent headaches, especially morning headaches. We talked about trying to maintain a healthy lifestyle in general, as well as the importance of weight control. We also talked about the importance of good sleep hygiene. I recommended the following at this time: sleep study.  I explained the sleep test procedure to the patient and also outlined possible surgical and non-surgical treatment options of OSA, including the use of a custom-made dental device (which would require a referral to a specialist dentist or oral surgeon), upper airway surgical options (which would involve a referral to an ENT surgeon). I also explained the CPAP treatment option to the patient, who indicated that she would be willing to try CPAP if the need arises. I explained the importance of being compliant with PAP treatment, not only for insurance purposes but primarily to improve Her symptoms, and for the patient's long term health benefit, including to reduce Her cardiovascular risks. I  answered all her questions today and the patient was in agreement. I plan to see her back after the sleep study is completed and encouraged her to call with any interim questions, concerns, problems or updates.   Thank you very much for allowing me to participate in the care of this nice patient. If I can be of any further assistance to you please do not hesitate to call me at [phone].  Sincerely,   Star Age, MD, PhD

## 2019-12-24 ENCOUNTER — Ambulatory Visit (INDEPENDENT_AMBULATORY_CARE_PROVIDER_SITE_OTHER): Payer: PPO | Admitting: Family

## 2019-12-24 ENCOUNTER — Other Ambulatory Visit: Payer: Self-pay

## 2019-12-24 DIAGNOSIS — J209 Acute bronchitis, unspecified: Secondary | ICD-10-CM

## 2019-12-24 MED ORDER — BENZONATATE 200 MG PO CAPS
200.0000 mg | ORAL_CAPSULE | Freq: Three times a day (TID) | ORAL | 0 refills | Status: DC | PRN
Start: 2019-12-24 — End: 2020-05-29

## 2019-12-24 MED ORDER — DOXYCYCLINE HYCLATE 100 MG PO TABS
100.0000 mg | ORAL_TABLET | Freq: Two times a day (BID) | ORAL | 0 refills | Status: DC
Start: 2019-12-24 — End: 2020-05-29

## 2019-12-24 MED ORDER — PREDNISONE 20 MG PO TABS
20.0000 mg | ORAL_TABLET | Freq: Every day | ORAL | 0 refills | Status: DC
Start: 2019-12-24 — End: 2020-05-29

## 2019-12-24 NOTE — Progress Notes (Signed)
[name] is a 79 y.o. female with the following history as recorded in EpicCare:  Patient Active Problem List   Diagnosis Date Noted  . Statin intolerance 12/03/2019  . Acquired hypothyroidism 12/02/2019  . Need for hepatitis C screening test 12/02/2019  . Loud snoring 12/02/2019  . Pure hypertriglyceridemia 02/28/2019  . Reactive airway disease 06/23/2017  . TIA (transient ischemic attack) 02/28/2017  . Depression with somatization 12/13/2016  . Allergic rhinitis due to pollen 06/21/2016  . Simple chronic bronchitis ([place]) 06/21/2016  . Routine general medical examination at a health care facility 11/29/2015  . GAD (generalized anxiety disorder) 12/02/2014  . IBS (irritable bowel syndrome) 01/20/2014  . B12 deficiency anemia 07/10/2013  . Vitamin D deficiency 01/04/2013  . HTN (hypertension) 07/26/2011  . CAD (coronary artery disease) 07/26/2011  . Nonalcoholic steatohepatitis ([facility]) 06/20/2009  . OBESITY 09/08/2007  . OSTEOPENIA 09/07/2007  . Hyperlipidemia with target LDL less than 100 07/30/2007    Current Outpatient Medications  Medication Sig Dispense Refill  . ALPRAZolam (XANAX) 0.5 MG tablet Take 1 tablet (0.5 mg total) by mouth 3 (three) times daily as needed. for anxiety 90 tablet 0  . benzonatate (TESSALON) 200 MG capsule Take 1 capsule (200 mg total) by mouth 3 (three) times daily as needed. 30 capsule 0  . brimonidine (ALPHAGAN) 0.15 % ophthalmic solution Place 1 drop into both eyes 3 (three) times daily.  3  . cetirizine (ZYRTEC) 10 MG tablet Take 1 tablet (10 mg total) by mouth daily. 90 tablet 3  . Cholecalciferol 50 MCG (2000 UT) TABS Take 1 tablet (2,000 Units total) by mouth daily. 90 tablet 0  . dicyclomine (BENTYL) 10 MG capsule Take 1 capsule (10 mg total) by mouth 4 (four) times daily -  before meals and at bedtime. 360 capsule 1  . dorzolamide (TRUSOPT) 2 % ophthalmic solution Place 1 drop into both eyes 3 (three) times daily.  1  . doxycycline  (VIBRA-TABS) 100 MG tablet Take 1 tablet (100 mg total) by mouth 2 (two) times daily. 20 tablet 0  . escitalopram (LEXAPRO) 20 MG tablet Take 1 tablet (20 mg total) by mouth daily. 90 tablet 1  . furosemide (LASIX) 20 MG tablet Take 1 tablet (20 mg total) by mouth daily. 90 tablet 1  . icosapent Ethyl (VASCEPA) 1 g capsule Take 2 capsules (2 g total) by mouth 2 (two) times daily. (Patient not taking: Reported on 12/18/2019) 360 capsule 1  . levothyroxine (SYNTHROID) 25 MCG tablet Take 1 tablet (25 mcg total) by mouth daily before breakfast. 90 tablet 0  . NON FORMULARY Vit b12 nasal spray-use weekly    . potassium chloride SA (KLOR-CON) 20 MEQ tablet Take 1 tablet (20 mEq total) by mouth 2 (two) times daily. 60 tablet 0  . predniSONE (DELTASONE) 20 MG tablet Take 1 tablet (20 mg total) by mouth daily with breakfast. 5 tablet 0   No current facility-administered medications for this visit.    Allergies: Diltiazem hcl, Statins, Zetia [ezetimibe], Codeine, Sulfonamide derivatives, Tape, and Tramadol  Past Medical History:  Diagnosis Date  . Anxiety state, unspecified   . CAD (coronary artery disease)   . Disorder of bone and cartilage, unspecified   . Diverticulosis of colon (without mention of hemorrhage)   . Dizziness and giddiness   . Fatty liver   . Fibromyalgia   . Obesity, unspecified   . Other and unspecified hyperlipidemia   . Other chronic nonalcoholic liver disease   . Skin  cancer    basal cell on face  . Stroke [facility])    TIA  . TIA (transient ischemic attack)   . Unspecified essential hypertension     Past Surgical History:  Procedure Laterality Date  . BACK SURGERY    . CHOLECYSTECTOMY    . MOHS SURGERY    . TONSILLECTOMY AND ADENOIDECTOMY    . TOTAL ABDOMINAL HYSTERECTOMY W/ BILATERAL SALPINGOOPHORECTOMY      Family History  Problem Relation Age of Onset  . Stomach cancer Mother   . Cancer Brother   . Breast cancer Sister   . Other Father   . Breast cancer Sister      Social History   Tobacco Use  . Smoking status: Never Smoker  . Smokeless tobacco: Never Used  Substance Use Topics  . Alcohol use: No    Subjective:   I connected with DYANNA SEITER on 12/24/19 at  9:40 AM EDT by a telephone call and verified that I am speaking with the correct person using two identifiers.   I discussed the limitations of evaluation and management by telemedicine and the availability of in person appointments. The patient expressed understanding and agreed to proceed. Provider in office/ patient is at home; provider and patient are only 2 people on telephone call.   Patient is concerned for bronchitis is prone to recurrent bronchitis; + cough but no chest pain or shortness of breath; + sinus pressure/ headache; fully vaccinated; Typically responds very well to Prednisone and antibiotics;     Objective:  There were no vitals filed for this visit.  Lungs: Respirations unlabored;   Assessment:  1. Acute bronchitis, unspecified organism     Plan:  Rx for Doxycycline 100 mg bid x 10 days, Prednisone 20 mg qd x 5 days, Tessalon Perles 200 mg tid prn; increase fluids, rest; If no improvement in 24 hours, she will plan to get COVID test; follow-up as needed.  Time spent 11 minutes  No follow-ups on file.  No orders of the defined types were placed in this encounter.   Requested Prescriptions   Signed Prescriptions Disp Refills  . predniSONE (DELTASONE) 20 MG tablet 5 tablet 0    Sig: Take 1 tablet (20 mg total) by mouth daily with breakfast.  . doxycycline (VIBRA-TABS) 100 MG tablet 20 tablet 0    Sig: Take 1 tablet (100 mg total) by mouth 2 (two) times daily.  . benzonatate (TESSALON) 200 MG capsule 30 capsule 0    Sig: Take 1 capsule (200 mg total) by mouth 3 (three) times daily as needed.

## 2019-12-28 ENCOUNTER — Other Ambulatory Visit: Payer: Self-pay | Admitting: Family

## 2019-12-31 ENCOUNTER — Ambulatory Visit (INDEPENDENT_AMBULATORY_CARE_PROVIDER_SITE_OTHER): Payer: PPO | Admitting: Neurology

## 2019-12-31 DIAGNOSIS — G4733 Obstructive sleep apnea (adult) (pediatric): Secondary | ICD-10-CM | POA: Diagnosis not present

## 2019-12-31 DIAGNOSIS — R0681 Apnea, not elsewhere classified: Secondary | ICD-10-CM

## 2019-12-31 DIAGNOSIS — G478 Other sleep disorders: Secondary | ICD-10-CM

## 2019-12-31 DIAGNOSIS — G4720 Circadian rhythm sleep disorder, unspecified type: Secondary | ICD-10-CM

## 2019-12-31 DIAGNOSIS — R351 Nocturia: Secondary | ICD-10-CM

## 2019-12-31 DIAGNOSIS — R0683 Snoring: Secondary | ICD-10-CM

## 2019-12-31 DIAGNOSIS — E663 Overweight: Secondary | ICD-10-CM

## 2019-12-31 DIAGNOSIS — G4719 Other hypersomnia: Secondary | ICD-10-CM

## 2020-01-16 DIAGNOSIS — N281 Cyst of kidney, acquired: Secondary | ICD-10-CM | POA: Diagnosis not present

## 2020-01-16 NOTE — Addendum Note (Signed)
Addended by: Star Age on: 01/16/2020 04:41 PM   Modules accepted: Orders

## 2020-01-16 NOTE — Procedures (Signed)
PATIENT'S NAME:  [name], [name] DOB:      06/25/1940      MR#:    [id number]     DATE OF RECORDING: 12/31/2019 REFERRING M.D.:  Dr. Scarlette Calico Study Performed:   Baseline Polysomnogram HISTORY: 79 year old woman with a history of hypertension, TIA, hyperlipidemia, fibromyalgia, diverticulosis, coronary artery disease, anxiety, and borderline obesity, who reports snoring and excessive daytime somnolence and nonrestorative sleep. The patient endorsed the Epworth Sleepiness Scale at 8 points. The patient's weight 170 pounds with a height of 63 (inches), resulting in a BMI of 30.1 kg/m2. The patient's neck circumference measured 14.5 inches.  CURRENT MEDICATIONS: Xanax, Alphagan, Zyrtec, Bentyl, Lexapro, Lasix, Synthroid, Vascepa   PROCEDURE:  This is a multichannel digital polysomnogram utilizing the Somnostar 11.2 system.  Electrodes and sensors were applied and monitored per AASM Specifications.   EEG, EOG, Chin and Limb EMG, were sampled at 200 Hz.  ECG, Snore and Nasal Pressure, Thermal Airflow, Respiratory Effort, CPAP Flow and Pressure, Oximetry was sampled at 50 Hz. Digital video and audio were recorded.      BASELINE STUDY  Lights Out was at 21:41 and Lights On at 05:00.  Total recording time (TRT) was 439.5 minutes, with a total sleep time (TST) of 333.5 minutes.   The patient's sleep latency was 30 minutes.  REM latency was 234.5 minutes, which is markedly delayed. The sleep efficiency was 75.9 %.     SLEEP ARCHITECTURE: WASO (Wake after sleep onset) was 75.5 minutes with mild to moderate sleep fragmentation noted. There were 45.5 minutes in Stage N1, 139 minutes Stage N2, 96.5 minutes Stage N3 and 52.5 minutes in Stage REM.  The percentage of Stage N1 was 13.6%, which is increased, Stage N2 was 41.7%, Stage N3 was 28.9% and Stage R (REM sleep) was 15.7%, which is reduced. The arousals were noted as: 52 were spontaneous, 0 were associated with PLMs, 9 were associated with respiratory  events.  RESPIRATORY ANALYSIS:  There were a total of 31 respiratory events:  1 obstructive apneas, 0 central apneas and 2 mixed apneas with a total of 3 apneas and an apnea index (AI) of .5 /hour. There were 28 hypopneas with a hypopnea index of 5. /hour. The patient also had 0 respiratory event related arousals (RERAs).      The total APNEA/HYPOPNEA INDEX (AHI) was 5.6/hour and the total RESPIRATORY DISTURBANCE INDEX was  5.6 /hour.  8 events occurred in REM sleep and 44 events in NREM. The REM AHI was  9.1 /hour, versus a non-REM AHI of 4.9. The patient spent 59.5 minutes of total sleep time in the supine position and 274 minutes in non-supine.. The supine AHI was 2.0 versus a non-supine AHI of 6.4.  OXYGEN SATURATION & C02:  The Wake baseline 02 saturation was 98%, with the lowest being 82%. Time spent below 89% saturation equaled 5 minutes.  PERIODIC LIMB MOVEMENTS: The patient had a total of 0 Periodic Limb Movements.  The Periodic Limb Movement (PLM) index was 0 and the PLM Arousal index was 0/hour.  Audio and video analysis did not show any abnormal or unusual movements, behaviors, phonations or vocalizations. The patient took no bathroom breaks. Moderate to loud snoring was noted. The EKG was in keeping with normal sinus rhythm (NSR).  Post-study, the patient indicated that sleep was the same as usual.   IMPRESSION:  1. Obstructive Sleep Apnea (OSA) 2. Dysfunctions associated with sleep stages or arousal from sleep  RECOMMENDATIONS:  1. This study demonstrates  overall mild obstructive sleep apnea, with a total AHI of 5.6/hour, REM AHI of 9.1/hour, and O2 nadir of 82%. The absence of supine REM sleep may underestimate her AHI and O2 nadir. Given the patient's medical history and sleep related complaints, treatment with positive airway pressure is recommended; this can be achieved in the form of autoPAP. Alternatively, a full-night CPAP titration study would allow optimization of therapy  if needed. Other treatment options may include avoidance of supine sleep position along with weight loss, upper airway or jaw surgery in selected patients or the use of an oral appliance in certain patients. ENT evaluation and/or consultation with a maxillofacial surgeon or dentist may be feasible in some instances.    2. Please note that untreated obstructive sleep apnea may carry additional perioperative morbidity. Patients with significant obstructive sleep apnea should receive perioperative PAP therapy and the surgeons and particularly the anesthesiologist should be informed of the diagnosis and the severity of the sleep disordered breathing. 3. This study shows sleep fragmentation and abnormal sleep stage percentages; these are nonspecific findings and per se do not signify an intrinsic sleep disorder or a cause for the patient's sleep-related symptoms. Causes include (but are not limited to) the first night effect of the sleep study, circadian rhythm disturbances, medication effect or an underlying mood disorder or medical problem.  4. The patient should be cautioned not to drive, work at heights, or operate dangerous or heavy equipment when tired or sleepy. Review and reiteration of good sleep hygiene measures should be pursued with any patient. 5. The patient will be seen in follow-up by Dr. Rexene Alberts at [facility] for discussion of the test results and further management strategies. The referring provider will be notified of the test results.  I certify that I have reviewed the entire raw data recording prior to the issuance of this report in accordance with the Standards of Accreditation of the American Academy of Sleep Medicine (AASM)  Star Age, MD, PhD Diplomat, American Board of Neurology and Sleep Medicine (Neurology and Sleep Medicine)

## 2020-01-16 NOTE — Progress Notes (Signed)
Patient referred by Dr. Ronnald Ramp, seen by me on 12/18/19, diagnostic PSG on 12/31/19.    Please call and notify the patient that the recent sleep study showed significant snoring and mild sleep apnea. It may be worth treating to see if she feels better after treatment. To that end I recommend treatment for this in the form of autoPAP, which means, that we don't have to bring her back for a second sleep study with CPAP, but will let him try an autoPAP machine at home, through a DME company (of her choice, or as per insurance requirement). The DME representative will educate her on how to use the machine, how to put the mask on, etc. I have placed an order in the chart. Please send referral, talk to patient, send report to referring MD. We will need a FU in sleep clinic for 10 weeks post-PAP set up, please arrange that with me or one of our NPs. Thanks,   Star Age, MD, PhD [facility])

## 2020-01-20 ENCOUNTER — Telehealth: Payer: Self-pay

## 2020-01-20 NOTE — Telephone Encounter (Signed)
-----   Message from Star Age, MD sent at 01/16/2020  4:41 PM EDT ----- Patient referred by Dr. Ronnald Ramp, seen by me on 12/18/19, diagnostic PSG on 12/31/19.    Please call and notify the patient that the recent sleep study showed significant snoring and mild sleep apnea. It may be worth treating to see if she feels better after treatment. To that end I recommend treatment for this in the form of autoPAP, which means, that we don't have to bring her back for a second sleep study with CPAP, but will let him try an autoPAP machine at home, through a DME company (of her choice, or as per insurance requirement). The DME representative will educate her on how to use the machine, how to put the mask on, etc. I have placed an order in the chart. Please send referral, talk to patient, send report to referring MD. We will need a FU in sleep clinic for 10 weeks post-PAP set up, please arrange that with me or one of our NPs. Thanks,   Star Age, MD, PhD [facility])

## 2020-01-20 NOTE — Telephone Encounter (Signed)
I called pt. I advised pt that Dr. Rexene Alberts reviewed their sleep study results and found that pt has significant snoring with mild osa. Dr. Rexene Alberts recommends that pt start an auto pap at home. I reviewed PAP compliance expectations with the pt. Pt is agreeable to starting an auto-PAP. I advised pt that an order will be sent to a DME, [facility], and [facility] will call the pt within about one week after they file with the pt's insurance. [facility] will show the pt how to use the machine, fit for masks, and troubleshoot the auto-PAP if needed. A follow up appt was made for insurance purposes with Amy, NP on 05/07/2020 at 9:30am. Pt verbalized understanding to arrive 15 minutes early and bring their auto-PAP. A letter with all of this information in it will be mailed to the pt as a reminder. I verified with the pt that the address we have on file is correct. Pt verbalized understanding of results. Pt had no questions at this time but was encouraged to call back if questions arise. I have sent the order to [facility] and have received confirmation that they have received the order.

## 2020-01-30 ENCOUNTER — Other Ambulatory Visit: Payer: Self-pay | Admitting: Internal Medicine

## 2020-01-30 DIAGNOSIS — I10 Essential (primary) hypertension: Secondary | ICD-10-CM

## 2020-02-10 ENCOUNTER — Telehealth: Payer: Self-pay | Admitting: Neurology

## 2020-02-10 NOTE — Telephone Encounter (Signed)
Pt states she missed a call, pt is asking for a call with the results to her sleep study, pt states she has not heard from anyone with the results to her sleep study.

## 2020-02-10 NOTE — Telephone Encounter (Signed)
Received this notice from [facility]: "Patient was lvm on 01/21/20 when patient was put into our system. Patient has [employer] which requires auth. We did not get that until 02/05/20.   I will have Caryl Pina reach out to the patient for scheduling."

## 2020-02-10 NOTE — Telephone Encounter (Signed)
error 

## 2020-02-10 NOTE — Telephone Encounter (Signed)
Pt states she missed a call from someone today.  Pt states she has not been called with the results to her sleep study, please call.

## 2020-02-10 NOTE — Telephone Encounter (Signed)
I called pt. She has not heard from [facility]. I will reach out to them and ask them to call her.

## 2020-02-14 NOTE — Chronic Care Management (AMB) (Signed)
Chronic Care Management Pharmacy  Name: FIA HEBERT  MRN: [id number] DOB: [birth year]   Chief Complaint/ HPI  Asencion Islam,  79 y.o. , female presents for their Initial CCM visit with the clinical pharmacist via telephone due to COVID-19 Pandemic.  PCP : Janith Lima, MD Patient Care Team: Janith Lima, MD as PCP - General (Internal Medicine) Charlton Haws, [facility] as Pharmacist (Pharmacist)  Their chronic conditions include: Hypertension, Hyperlipidemia, Coronary Artery Disease, Hypothyroidism, Depression, Anxiety, Osteopenia and Allergic Rhinitis, NASH, IBS  Born and raised in [place]. On her own. Daughters and sister live in the same neighborhood with. 9 great-grandchildren. Out in the yard.  Office Visits: 12/24/19 NP Jodi Mourning TV: acute bronchitis, rx doxycycline, prednisone, tessalon  12/02/19 Dr Ronnald Ramp OV: c/o RLQ pain and L flank pain.  10/08/19 NP Jodi Mourning OV: TSH slightly elevated, sx fatigue, cold intolerance - started levothyroxine 25 mcg.  08/05/19 Dr Ronnald Ramp OV: UTI tx'd with nitrofurantoin  Consult Visit: 12/31/19 sleep study: significant snoring, mild OSA. Recommend autoPAP. Referred to urology for likely benign growth on kidney. No med changes  12/18/19 Dr Rexene Alberts (neurology): sleep clinic, rec'd sleep study  Allergies  Allergen Reactions  . Diltiazem Hcl Other (See Comments)    REACTION: pt states it made her dizzy...  . Statins     Muscle aches  . Zetia [Ezetimibe]     "it made me feel bad"  . Codeine Nausea Only  . Sulfonamide Derivatives Hives, Itching and Rash  . Tape Itching  . Tramadol Itching   Medications: Outpatient Encounter Medications as of 02/17/2020  Medication Sig Note  . ALPRAZolam (XANAX) 0.5 MG tablet Take 1 tablet (0.5 mg total) by mouth 3 (three) times daily as needed. for anxiety   . benzonatate (TESSALON) 200 MG capsule Take 1 capsule (200 mg total) by mouth 3 (three) times daily as needed.   . brimonidine (ALPHAGAN) 0.15  % ophthalmic solution Place 1 drop into both eyes 3 (three) times daily.   . cetirizine (ZYRTEC) 10 MG tablet Take 1 tablet (10 mg total) by mouth daily.   . Cholecalciferol 50 MCG (2000 UT) TABS Take 1 tablet (2,000 Units total) by mouth daily.   Marland Kitchen dicyclomine (BENTYL) 10 MG capsule Take 1 capsule (10 mg total) by mouth 4 (four) times daily -  before meals and at bedtime.   . dorzolamide (TRUSOPT) 2 % ophthalmic solution Place 1 drop into both eyes 3 (three) times daily.   Marland Kitchen escitalopram (LEXAPRO) 20 MG tablet Take 1 tablet (20 mg total) by mouth daily.   . furosemide (LASIX) 20 MG tablet Take 1 tablet (20 mg total) by mouth daily.   Marland Kitchen levothyroxine (SYNTHROID) 25 MCG tablet TAKE 1 TABLET BY MOUTH ONCE DAILY BEFORE BREAKFAST   . potassium chloride SA (KLOR-CON) 20 MEQ tablet Take 1 tablet by mouth twice daily   . doxycycline (VIBRA-TABS) 100 MG tablet Take 1 tablet (100 mg total) by mouth 2 (two) times daily. (Patient not taking: Reported on 02/17/2020)   . icosapent Ethyl (VASCEPA) 1 g capsule Take 2 capsules (2 g total) by mouth 2 (two) times daily. (Patient not taking: Reported on 12/18/2019) 12/18/2019: Has started yet.  . NON FORMULARY Vit b12 nasal spray-use weekly (Patient not taking: Reported on 02/17/2020)   . predniSONE (DELTASONE) 20 MG tablet Take 1 tablet (20 mg total) by mouth daily with breakfast. (Patient not taking: Reported on 02/17/2020)    No facility-administered encounter medications on file  as of 02/17/2020.    Current Diagnosis/Assessment:  SDOH Interventions     Most Recent Value  SDOH Interventions  Financial Strain Interventions Other (Comment)  [[employer] grant for [place]            This Visit's Progress   . Pharmacy Care Plan       CARE PLAN ENTRY (see longitudinal plan of care for additional care plan information)  Current Barriers:  . Chronic Disease Management support, education, and care coordination needs related to  Hypertension and Hyperlipidemia   Hypertension BP Readings from Last 3 Encounters:  12/18/19 137/78  12/02/19 134/70  12/02/19 134/70 .  Pharmacist Clinical Goal(s): o Over the next 180 days, patient will work with PharmD and providers to achieve BP goal <140/90 . Current regimen:  o No medications . Interventions: o Discussed BP goals and benefits of diet/exercise for prevention of heart attack / stroke . Patient self care activities - Over the next 180 days, patient will: o Check BP 1-2 times per week, document, and provide at future appointments o Ensure daily salt intake < 2300 mg/day  Hyperlipidemia / Coronary artery disease Lab Results  Component Value Date/Time   LDLCALC 167 (H) 12/02/2019 09:49 AM   LDLDIRECT 174.0 02/27/2019 04:39 PM   TRIG 124.0 12/02/2019 09:49 AM .  Pharmacist Clinical Goal(s): o Over the next 180 days, patient will work with PharmD and providers to achieve LDL goal < 100 and TRIG < 150 . Current regimen:  o Vascepa 1 gram - 2 capsules twice a day (not taking due to cost) . Interventions: o Obtained [employer] Grant for Vascepa copays through 01/16/2021 . Patient self care activities - Over the next 180 days, patient will: o Fill Vascepa using [employer] copay card  Medication management . Pharmacist Clinical Goal(s): o Over the next 30 days, patient will work with PharmD and providers to achieve optimal medication adherence . Current pharmacy: [facility] . Interventions o Comprehensive medication review performed. o Utilize [facility] pharmacy for medication synchronization, packaging and delivery . Patient self care activities - Over the next 30 days, patient will: o Focus on medication adherence by pill pack o Take medications as prescribed o Report any questions or concerns to PharmD and/or provider(s)  Initial goal documentation      Hypertension   BP goal is:  <140/90  Office blood pressures are  BP Readings from Last 3 Encounters:   12/18/19 137/78  12/02/19 134/70  12/02/19 134/70   Kidney Function Lab Results  Component Value Date/Time   CREATININE 0.88 12/02/2019 09:49 AM   CREATININE 0.79 10/08/2019 12:20 PM   GFR 62.01 12/02/2019 09:49 AM   GFRNONAA >60 07/06/2017 09:58 AM   GFRAA >60 07/06/2017 09:58 AM   K 4.4 12/02/2019 09:49 AM   K 3.9 10/08/2019 12:20 PM   Patient checks BP at home infrequently Patient home BP readings are ranging: n/a  Patient has failed these meds in the past: n/a Patient is currently controlled on the following medications:  . Furosemide 20 mg daily   We discussed: Pt takes furosemide for swelling around knees; discussed effect on BP and potassium  Plan  Continue current medications and control with diet and exercise     Hyperlipidemia   LDL goal < 70 Hx nonobstructive CAD, TIA  Lipid Panel     Component Value Date/Time   CHOL 234 (H) 12/02/2019 0949   TRIG 124.0 12/02/2019 0949   HDL 42.50  12/02/2019 0949   LDLCALC 167 (H) 12/02/2019 0949   LDLDIRECT 174.0 02/27/2019 1639    Hepatic Function Latest Ref Rng & Units 12/02/2019 10/08/2019 02/27/2019  Total Protein 6.0 - 8.3 g/dL 7.2 7.2 7.1  Albumin 3.5 - 5.2 g/dL 4.5 4.5 4.3  AST 0 - 37 U/L 24 28 22   ALT 0 - 35 U/L 18 23 17   Alk Phosphatase 39 - 117 U/L 112 145(H) 142(H)  Total Bilirubin 0.2 - 1.2 mg/dL 0.6 0.6 0.6  Bilirubin, Direct 0.0 - 0.3 mg/dL 0.1 - 0.1     The 10-year ASCVD risk score Mikey Bussing DC Jr., et al., 2013) is: 33.2%   Values used to calculate the score:     Age: 34 years     Sex: Female     Is Non-Hispanic African American: No     Diabetic: No     Tobacco smoker: No     Systolic Blood Pressure: 381 mmHg     Is BP treated: Yes     HDL Cholesterol: 42.5 mg/dL     Total Cholesterol: 234 mg/dL   Patient has failed these meds in past: rosuvastatin, atorvastatin, pravastatin, simvastatin, ezetimibe  Patient is currently uncontrolled on the following medications:  Marland Kitchen Vascepa 1 g - 2 cap BID - not  taking . Fish oil 900 mg OTC -  2 daily  We discussed:  Cholesterol goals; pt has failed multiple statins in the past; she was referred to lipid clinic in 2017 for possible PCSK9 but did not keep appt per chart. Repatha is non-preferred with insurance ($90/month); pt will think about injectable therapy.  Pt never started Vascepa due to high copay; she is taking OTC omega-3 instead. Obtained [employer] grant for Vascepa copay, pt agreed to start taking it.  [employer] copay card info (approved through 01/16/2021) RX BIN: [id number] RX PCN: [id number] RX GRP: [id number] ID: [id number]  Plan  Continue current medications  Fill Vascepa through [place] card  Depression / Anxiety   Depression screen [facility] 2/9 12/02/2019 12/02/2019 08/05/2019  Decreased Interest 0 0 0  Down, Depressed, Hopeless 0 0 0  PHQ - 2 Score 0 0 0  Altered sleeping - 0 -  Tired, decreased energy - 0 -  Change in appetite - 1 -  Feeling bad or failure about yourself  - 0 -  Trouble concentrating - 1 -  Moving slowly or fidgety/restless - 0 -  Suicidal thoughts - 0 -  PHQ-9 Score - 2 -  Difficult doing work/chores - Not difficult at all -  Some recent data might be hidden   Patient has failed these meds in past: n/a Patient is currently controlled on the following medications:  . Escitalopram 20 mg daily HS . Alprazolam 0.5 mg TID prn  We discussed:  Pt reports she typically takes 2 alprazolam at bedtime, sometimes 1 during the day PRN for anxiety. She denies issues with regimen  Plan  Continue current medications  Hypothyroidism   Lab Results  Component Value Date/Time   TSH 2.83 12/02/2019 09:49 AM   TSH 5.56 (H) 10/08/2019 12:20 PM    Patient has failed these meds in past: n/a Patient is currently controlled on the following medications:  . Levothyroxine 25 mcg daily  We discussed: duration of therapy with levothyroxine; pt denies issues currently  Plan  Continue current medications  IBS    Patient has failed these meds in past: n/a Patient is currently controlled on the following medications:  . Dicyclomine  10 mg QID prn  We discussed:  Pt takes at least 2 dicyclomine every day, up to 4 on days IBS is uncontrolled which does not happen often  Plan  Continue current medications  Osteopenia   Last DEXA Scan: n/a  VITD  Date Value Ref Range Status  02/27/2019 24.51 (L) 30.00 - 100.00 ng/mL Final   Patient has failed these meds in past: n/a Patient is currently controlled on the following medications:  Marland Kitchen Vitamin D 2000 IU daily  We discussed:  Recommend [phone] units of vitamin D daily. Recommend 1200 mg of calcium daily from dietary and supplemental sources.  Plan  Continue current medications  Allergic rhinitis   Chronic cough  Patient has failed these meds in past: n/a Patient is currently controlled on the following medications:  . Cetirizine 10 mg daily PRN . Benzonatate 200 mg TID prn  We discussed:  Pt reports seasonal allergies in the fall, only takes meds then  Plan  Continue current medications  Glaucoma   Patient has failed these meds in past: n/a Patient is currently controlled on the following medications:  . Brimonidine (Alphagan) 0.015% eye drops TID . Dorzolamide 2% eye drops TID . Xiidra eye drops  We discussed:  Patient is satisfied with current regimen and denies issues  Plan  Continue current medications  Health Maintenance   Lab Results  Component Value Date/Time   K 4.4 12/02/2019 09:49 AM   K 3.9 10/08/2019 12:20 PM   Patient is currently controlled on the following medications:  . Potassium chloride 20 meq BID . Tylenol 500 mg - PRN  We discussed:  Patient is satisfied with current regimen and denies issues  Plan  Continue current medications  Medication Management   Pt uses [facility] for all medications Uses pill box? Yes Pt endorses 100% compliance  We discussed: Verbal consent obtained  for [facility] enhanced pharmacy services (medication synchronization, adherence packaging, delivery coordination). A medication sync plan was created to allow patient to get all medications delivered once every 30 to 90 days per patient preference. Patient understands they have freedom to choose pharmacy and clinical pharmacist will coordinate care between all prescribers and [facility].   Plan  Utilize [facility] pharmacy for medication synchronization, packaging and delivery    Follow up: 6 month phone visit  Charlene Brooke, PharmD, [facility] Clinical Pharmacist [facility] [phone]

## 2020-02-17 ENCOUNTER — Other Ambulatory Visit: Payer: Self-pay

## 2020-02-17 ENCOUNTER — Ambulatory Visit: Payer: PPO | Admitting: Pharmacist

## 2020-02-17 DIAGNOSIS — I2510 Atherosclerotic heart disease of native coronary artery without angina pectoris: Secondary | ICD-10-CM

## 2020-02-17 DIAGNOSIS — K582 Mixed irritable bowel syndrome: Secondary | ICD-10-CM

## 2020-02-17 DIAGNOSIS — E559 Vitamin D deficiency, unspecified: Secondary | ICD-10-CM

## 2020-02-17 DIAGNOSIS — E785 Hyperlipidemia, unspecified: Secondary | ICD-10-CM

## 2020-02-17 DIAGNOSIS — I10 Essential (primary) hypertension: Secondary | ICD-10-CM

## 2020-02-17 DIAGNOSIS — J301 Allergic rhinitis due to pollen: Secondary | ICD-10-CM

## 2020-02-17 DIAGNOSIS — F411 Generalized anxiety disorder: Secondary | ICD-10-CM

## 2020-02-17 MED ORDER — CHOLECALCIFEROL 50 MCG (2000 UT) PO TABS: 2000.0000 [IU] | ORAL_TABLET | Freq: Every day | ORAL | 0 refills | Status: DC

## 2020-02-17 MED ORDER — DICYCLOMINE HCL 10 MG PO CAPS: 10.0000 mg | ORAL_CAPSULE | Freq: Three times a day (TID) | ORAL | 1 refills | Status: DC

## 2020-02-17 MED ORDER — POTASSIUM CHLORIDE CRYS ER 20 MEQ PO TBCR: 20.0000 meq | EXTENDED_RELEASE_TABLET | Freq: Two times a day (BID) | ORAL | 0 refills | Status: DC

## 2020-02-17 MED ORDER — FUROSEMIDE 20 MG PO TABS: 20.0000 mg | ORAL_TABLET | Freq: Every day | ORAL | 1 refills | Status: DC

## 2020-02-17 MED ORDER — CETIRIZINE HCL 10 MG PO TABS: 10.0000 mg | ORAL_TABLET | Freq: Every day | ORAL | 3 refills | Status: DC

## 2020-02-17 MED ORDER — LEVOTHYROXINE SODIUM 25 MCG PO TABS
ORAL_TABLET | ORAL | 1 refills | Status: DC
Start: 2020-02-17 — End: 2020-10-29

## 2020-02-17 MED ORDER — ALPRAZOLAM 0.5 MG PO TABS: 0.5000 mg | ORAL_TABLET | Freq: Three times a day (TID) | ORAL | 0 refills | Status: DC | PRN

## 2020-02-17 NOTE — Patient Instructions (Addendum)
Visit Information  Phone number for Pharmacist: [phone]  Thank you for meeting with me to discuss your medications! I look forward to working with you to achieve your health care goals. Below is a summary of what we talked about during the visit:  Goals Addressed            This Visit's Progress   . Pharmacy Care Plan       CARE PLAN ENTRY (see longitudinal plan of care for additional care plan information)  Current Barriers:  . Chronic Disease Management support, education, and care coordination needs related to Hypertension and Hyperlipidemia   Hypertension BP Readings from Last 3 Encounters:  12/18/19 137/78  12/02/19 134/70  12/02/19 134/70 .  Pharmacist Clinical Goal(s): o Over the next 180 days, patient will work with PharmD and providers to achieve BP goal <140/90 . Current regimen:  o No medications . Interventions: o Discussed BP goals and benefits of diet/exercise for prevention of heart attack / stroke . Patient self care activities - Over the next 180 days, patient will: o Check BP 1-2 times per week, document, and provide at future appointments o Ensure daily salt intake < 2300 mg/day  Hyperlipidemia / Coronary artery disease Lab Results  Component Value Date/Time   LDLCALC 167 (H) 12/02/2019 09:49 AM   LDLDIRECT 174.0 02/27/2019 04:39 PM   TRIG 124.0 12/02/2019 09:49 AM .  Pharmacist Clinical Goal(s): o Over the next 180 days, patient will work with PharmD and providers to achieve LDL goal < 100 and TRIG < 150 . Current regimen:  o Vascepa 1 gram - 2 capsules twice a day (not taking due to cost) . Interventions: o Obtained [employer] Grant for Vascepa copays through 01/16/2021 . Patient self care activities - Over the next 180 days, patient will: o Fill Vascepa using [employer] copay card  Medication management . Pharmacist Clinical Goal(s): o Over the next 30 days, patient will work with PharmD and providers to achieve optimal medication  adherence . Current pharmacy: [facility] . Interventions o Comprehensive medication review performed. o Utilize [facility] pharmacy for medication synchronization, packaging and delivery . Patient self care activities - Over the next 30 days, patient will: o Focus on medication adherence by pill pack o Take medications as prescribed o Report any questions or concerns to PharmD and/or provider(s)  Initial goal documentation      [name] was given information about Chronic Care Management services today including:  1. CCM service includes personalized support from designated clinical staff supervised by her physician, including individualized plan of care and coordination with other care providers 2. 24/7 contact phone numbers for assistance for urgent and routine care needs. 3. Standard insurance, coinsurance, copays and deductibles apply for chronic care management only during months in which we provide at least 20 minutes of these services. Most insurances cover these services at 100%, however patients may be responsible for any copay, coinsurance and/or deductible if applicable. This service may help you avoid the need for more expensive face-to-face services. 4. Only one practitioner may furnish and bill the service in a calendar month. 5. The patient may stop CCM services at any time (effective at the end of the month) by phone call to the office staff.  Patient agreed to services and verbal consent obtained.   Patient verbalizes understanding of instructions provided today.  The pharmacy team will reach out to the patient again over the next 30 days.   [name], PharmD, [facility] Clinical Pharmacist [place]  [facility]  Cholesterol Content in Foods Cholesterol is a waxy, fat-like substance that helps to carry fat in the blood. The body needs cholesterol in small amounts, but too much cholesterol can cause damage to the arteries and heart. Most  people should eat less than 200 milligrams (mg) of cholesterol a day. Foods with cholesterol  Cholesterol is found in animal-based foods, such as meat, seafood, and dairy. Generally, low-fat dairy and lean meats have less cholesterol than full-fat dairy and fatty meats. The milligrams of cholesterol per serving (mg per serving) of common cholesterol-containing foods are listed below. Meat and other proteins  Egg -- one large whole egg has 186 mg.  Veal shank -- 4 oz has 141 mg.  Lean ground [place] (93% lean) -- 4 oz has 118 mg.  Fat-trimmed lamb loin -- 4 oz has 106 mg.  Lean ground beef (90% lean) -- 4 oz has 100 mg.  Lobster -- 3.5 oz has 90 mg.  Pork loin chops -- 4 oz has 86 mg.  Canned salmon -- 3.5 oz has 83 mg.  Fat-trimmed beef top loin -- 4 oz has 78 mg.  Frankfurter -- 1 frank (3.5 oz) has 77 mg.  Crab -- 3.5 oz has 71 mg.  Roasted chicken without skin, white meat -- 4 oz has 66 mg.  Light bologna -- 2 oz has 45 mg.  Deli-cut [place] -- 2 oz has 31 mg.  Canned tuna -- 3.5 oz has 31 mg.  [name] -- 1 oz has 29 mg.  Oysters and mussels (raw) -- 3.5 oz has 25 mg.  Mackerel -- 1 oz has 22 mg.  Trout -- 1 oz has 20 mg.  Pork sausage -- 1 link (1 oz) has 17 mg.  Salmon -- 1 oz has 16 mg.  Tilapia -- 1 oz has 14 mg. Dairy  Soft-serve ice cream --  cup (4 oz) has 103 mg.  Whole-milk yogurt -- 1 cup (8 oz) has 29 mg.  Cheddar cheese -- 1 oz has 28 mg.  American cheese -- 1 oz has 28 mg.  Whole milk -- 1 cup (8 oz) has 23 mg.  2% milk -- 1 cup (8 oz) has 18 mg.  Cream cheese -- 1 tablespoon (Tbsp) has 15 mg.  Cottage cheese --  cup (4 oz) has 14 mg.  Low-fat (1%) milk -- 1 cup (8 oz) has 10 mg.  Sour cream -- 1 Tbsp has 8.5 mg.  Low-fat yogurt -- 1 cup (8 oz) has 8 mg.  Nonfat Greek yogurt -- 1 cup (8 oz) has 7 mg.  Half-and-half cream -- 1 Tbsp has 5 mg. Fats and oils  Cod liver oil -- 1 tablespoon (Tbsp) has 82 mg.  Butter -- 1 Tbsp has  15 mg.  Lard -- 1 Tbsp has 14 mg.  Bacon grease -- 1 Tbsp has 14 mg.  Mayonnaise -- 1 Tbsp has 5-10 mg.  Margarine -- 1 Tbsp has 3-10 mg. Exact amounts of cholesterol in these foods may vary depending on specific ingredients and brands. Foods without cholesterol Most plant-based foods do not have cholesterol unless you combine them with a food that has cholesterol. Foods without cholesterol include:  Grains and cereals.  Vegetables.  Fruits.  Vegetable oils, such as olive, canola, and sunflower oil.  Legumes, such as peas, beans, and lentils.  Nuts and seeds.  Egg whites. Summary  The body needs cholesterol in small amounts, but too much cholesterol can  cause damage to the arteries and heart.  Most people should eat less than 200 milligrams (mg) of cholesterol a day. This information is not intended to replace advice given to you by your health care provider. Make sure you discuss any questions you have with your health care provider. Document Revised: 05/19/2017 Document Reviewed: 01/31/2017 Elsevier Patient Education  [place].

## 2020-02-17 NOTE — Telephone Encounter (Signed)
-----   Message from Charlton Haws, [facility] sent at 02/17/2020 10:32 AM EDT ----- Regarding: Med refills [facility] [place] is switching to [facility] delivery, can you order refills of these meds to [facility]?  Alprazolam 0.5 mg Vitamin D 2000 IU Dicyclomine 10 mg Escitalopram 5 mg Furosemide 20 mg Levothyroxine 25 mcg Potassium chloride 20 mEq Cetirizine 10 mg Vascepa 1 gram

## 2020-02-18 DIAGNOSIS — H5203 Hypermetropia, bilateral: Secondary | ICD-10-CM | POA: Diagnosis not present

## 2020-02-18 DIAGNOSIS — H401131 Primary open-angle glaucoma, bilateral, mild stage: Secondary | ICD-10-CM | POA: Diagnosis not present

## 2020-02-18 DIAGNOSIS — H2513 Age-related nuclear cataract, bilateral: Secondary | ICD-10-CM | POA: Diagnosis not present

## 2020-02-18 NOTE — Addendum Note (Signed)
Addended by: Juliet Rude on: 02/18/2020 04:46 PM   Modules accepted: Orders

## 2020-03-09 ENCOUNTER — Telehealth: Payer: Self-pay | Admitting: Pharmacist

## 2020-03-09 NOTE — Progress Notes (Addendum)
Chronic Care Management Pharmacy Assistant   Name: LEIALOHA HANNA  MRN: [id number] DOB: 29-Apr-1941  Reason for Encounter: Medication Review  Patient Questions:  1.  Have you seen any other providers since your last visit? No  2.  Any changes in your medicines or health? No   PCP : Janith Lima, MD  Allergies:   Allergies  Allergen Reactions  . Diltiazem Hcl Other (See Comments)    REACTION: pt states it made her dizzy...  . Statins     Muscle aches  . Zetia [Ezetimibe]     "it made me feel bad"  . Codeine Nausea Only  . Sulfonamide Derivatives Hives, Itching and Rash  . Tape Itching  . Tramadol Itching    Medications: Outpatient Encounter Medications as of 03/09/2020  Medication Sig Note  . acetaminophen (TYLENOL) 500 MG tablet Take 500 mg by mouth every 6 (six) hours as needed.   . ALPRAZolam (XANAX) 0.5 MG tablet Take 1 tablet (0.5 mg total) by mouth 3 (three) times daily as needed. for anxiety   . benzonatate (TESSALON) 200 MG capsule Take 1 capsule (200 mg total) by mouth 3 (three) times daily as needed.   . brimonidine (ALPHAGAN) 0.15 % ophthalmic solution Place 1 drop into both eyes 3 (three) times daily.   . cetirizine (ZYRTEC) 10 MG tablet Take 1 tablet (10 mg total) by mouth daily.   . Cholecalciferol 50 MCG (2000 UT) TABS Take 1 tablet (2,000 Units total) by mouth daily.   Marland Kitchen dicyclomine (BENTYL) 10 MG capsule Take 1 capsule (10 mg total) by mouth 4 (four) times daily -  before meals and at bedtime.   . dorzolamide (TRUSOPT) 2 % ophthalmic solution Place 1 drop into both eyes 3 (three) times daily.   Marland Kitchen doxycycline (VIBRA-TABS) 100 MG tablet Take 1 tablet (100 mg total) by mouth 2 (two) times daily. (Patient not taking: Reported on 02/17/2020)   . escitalopram (LEXAPRO) 20 MG tablet Take 1 tablet (20 mg total) by mouth daily.   . furosemide (LASIX) 20 MG tablet Take 1 tablet (20 mg total) by mouth daily.   Marland Kitchen icosapent Ethyl (VASCEPA) 1 g capsule Take 2 capsules  (2 g total) by mouth 2 (two) times daily. (Patient not taking: Reported on 12/18/2019) 12/18/2019: Has started yet.  Marland Kitchen levothyroxine (SYNTHROID) 25 MCG tablet TAKE 1 TABLET BY MOUTH ONCE DAILY BEFORE BREAKFAST   . NON FORMULARY Vit b12 nasal spray-use weekly (Patient not taking: Reported on 02/17/2020)   . potassium chloride SA (KLOR-CON) 20 MEQ tablet Take 1 tablet (20 mEq total) by mouth 2 (two) times daily.   . predniSONE (DELTASONE) 20 MG tablet Take 1 tablet (20 mg total) by mouth daily with breakfast. (Patient not taking: Reported on 02/17/2020)    No facility-administered encounter medications on file as of 03/09/2020.    Current Diagnosis: Patient Active Problem List   Diagnosis Date Noted  . Statin intolerance 12/03/2019  . Acquired hypothyroidism 12/02/2019  . Need for hepatitis C screening test 12/02/2019  . Loud snoring 12/02/2019  . Pure hypertriglyceridemia 02/28/2019  . Reactive airway disease 06/23/2017  . TIA (transient ischemic attack) 02/28/2017  . Depression with somatization 12/13/2016  . Allergic rhinitis due to pollen 06/21/2016  . Simple chronic bronchitis ([place]) 06/21/2016  . Routine general medical examination at a health care facility 11/29/2015  . GAD (generalized anxiety disorder) 12/02/2014  . IBS (irritable bowel syndrome) 01/20/2014  . B12 deficiency anemia 07/10/2013  .  Vitamin D deficiency 01/04/2013  . HTN (hypertension) 07/26/2011  . CAD (coronary artery disease) 07/26/2011  . Nonalcoholic steatohepatitis (NASH) 06/20/2009  . OBESITY 09/08/2007  . OSTEOPENIA 09/07/2007  . Hyperlipidemia with target LDL less than 100 07/30/2007    Goals Addressed   None     Follow-Up:  Coordination of Enhanced Pharmacy Services     Reviewed chart for medication changes ahead of medication coordination call.  No OVs, Consults, or hospital visits since last care coordination call/Pharmacist visit. No medication changes indicated.   BP Readings from Last 3  Encounters:  12/18/19 137/78  12/02/19 134/70  12/02/19 134/70    Lab Results  Component Value Date   HGBA1C 4.9 03/01/2017     Patient obtains medications through Adherence Packaging  30 Days   Last adherence delivery included:   Alprazolam 0.5mg   three times daily PRN-Vial Potassium Ch Er 72meq twice daily-Breakfast, Bedtime Vascepa  1 gram twice daily-Breakfast, Bedtime   Patient is due for next adherence delivery on: 03/16/2020. Called patient and reviewed medications and coordinated delivery.  This delivery to include:  Xiidra 5% Eye Drop twice daily  Brimonidine 0.15% three times daily  Dorzolamide 2% three times daily  Dicyclomine 10mg  twice daily-Vial  Alprazolam 0.5mg   three times daily PRN-Vial Levothyroxine 73mcg daily- Before Breakfast  Furosemide 20mg  daily- Breakfast  Vitamin D 200 units daily- Breakfast  Potassium Ch Er 69meq twice daily-Breakfast, Bedtime Vascepa 1 gram twice daily- 2 capsule Breakfast, Bedtime  Escitalopram 20mg  daily-  Bedtime    Patient declined the following medications Cetrizine 10mg  due to no longer taking.   Confirmed delivery date of 03/16/2020, advised patient that pharmacy will contact them the morning of delivery.   Rosendo Gros, [place] Pharmacist Assistant  [phone]

## 2020-03-13 ENCOUNTER — Ambulatory Visit (INDEPENDENT_AMBULATORY_CARE_PROVIDER_SITE_OTHER): Payer: PPO | Admitting: Otolaryngology

## 2020-03-13 ENCOUNTER — Other Ambulatory Visit: Payer: Self-pay

## 2020-03-13 VITALS — Temp 97.3°F

## 2020-03-13 DIAGNOSIS — M26609 Unspecified temporomandibular joint disorder, unspecified side: Secondary | ICD-10-CM

## 2020-03-13 DIAGNOSIS — Z1231 Encounter for screening mammogram for malignant neoplasm of breast: Secondary | ICD-10-CM | POA: Diagnosis not present

## 2020-03-13 DIAGNOSIS — N952 Postmenopausal atrophic vaginitis: Secondary | ICD-10-CM | POA: Diagnosis not present

## 2020-03-13 DIAGNOSIS — H6983 Other specified disorders of Eustachian tube, bilateral: Secondary | ICD-10-CM | POA: Diagnosis not present

## 2020-03-13 DIAGNOSIS — N8111 Cystocele, midline: Secondary | ICD-10-CM | POA: Diagnosis not present

## 2020-03-13 NOTE — Progress Notes (Signed)
HPI: [name] is a 79 y.o. female who returns today for evaluation of ear complaints.  She has had intermittent ear pain.  She was treated with antibiotics for sore throat.  But she feels like there is water in her ears.  She has had previous eustachian tube problems and tubes placed in her years over 4 years ago.  She is also has some dizziness.  She presents today to have her ear checked and thought she might need tubes again..  Past Medical History:  Diagnosis Date  . Anxiety state, unspecified   . CAD (coronary artery disease)   . Disorder of bone and cartilage, unspecified   . Diverticulosis of colon (without mention of hemorrhage)   . Dizziness and giddiness   . Fatty liver   . Fibromyalgia   . Obesity, unspecified   . Other and unspecified hyperlipidemia   . Other chronic nonalcoholic liver disease   . Skin cancer    basal cell on face  . Stroke [facility])    TIA  . TIA (transient ischemic attack)   . Unspecified essential hypertension    Past Surgical History:  Procedure Laterality Date  . BACK SURGERY    . CHOLECYSTECTOMY    . MOHS SURGERY    . TONSILLECTOMY AND ADENOIDECTOMY    . TOTAL ABDOMINAL HYSTERECTOMY W/ BILATERAL SALPINGOOPHORECTOMY     Social History   Socioeconomic History  . Marital status: Married    Spouse name: Thompson Grayer. Ranieri Sr.  . Number of children: 4  . Years of education: Not on file  . Highest education level: Not on file  Occupational History    Employer: RETIRED  Tobacco Use  . Smoking status: Never Smoker  . Smokeless tobacco: Never Used  Vaping Use  . Vaping Use: Never used  Substance and Sexual Activity  . Alcohol use: No  . Drug use: No  . Sexual activity: Not Currently  Other Topics Concern  . Not on file  Social History Narrative  . Not on file   Social Determinants of Health   Financial Resource Strain: Medium Risk  . Difficulty of Paying Living Expenses: Somewhat hard  Food Insecurity:   . Worried About Sales executive in the Last Year: Not on file  . Ran Out of Food in the Last Year: Not on file  Transportation Needs:   . Lack of Transportation (Medical): Not on file  . Lack of Transportation (Non-Medical): Not on file  Physical Activity:   . Days of Exercise per Week: Not on file  . Minutes of Exercise per Session: Not on file  Stress:   . Feeling of Stress : Not on file  Social Connections:   . Frequency of Communication with Friends and Family: Not on file  . Frequency of Social Gatherings with Friends and Family: Not on file  . Attends Religious Services: Not on file  . Active Member of Clubs or Organizations: Not on file  . Attends Archivist Meetings: Not on file  . Marital Status: Not on file   Family History  Problem Relation Age of Onset  . Stomach cancer Mother   . Cancer Brother   . Breast cancer Sister   . Other Father   . Breast cancer Sister    Allergies  Allergen Reactions  . Diltiazem Hcl Other (See Comments)    REACTION: pt states it made her dizzy...  . Statins     Muscle aches  . Zetia [  Ezetimibe]     "it made me feel bad"  . Codeine Nausea Only  . Sulfonamide Derivatives Hives, Itching and Rash  . Tape Itching  . Tramadol Itching   Prior to Admission medications   Medication Sig Start Date End Date Taking? Authorizing Provider  acetaminophen (TYLENOL) 500 MG tablet Take 500 mg by mouth every 6 (six) hours as needed.   Yes [provider]  ALPRAZolam (XANAX) 0.5 MG tablet Take 1 tablet (0.5 mg total) by mouth 3 (three) times daily as needed. for anxiety 02/17/20  Yes Janith Lima, MD  benzonatate (TESSALON) 200 MG capsule Take 1 capsule (200 mg total) by mouth 3 (three) times daily as needed. 12/24/19  Yes Marrian Salvage, FNP  brimonidine (ALPHAGAN) 0.15 % ophthalmic solution Place 1 drop into both eyes 3 (three) times daily. 01/20/17  Yes [provider]  cetirizine (ZYRTEC) 10 MG tablet Take 1 tablet (10 mg total) by mouth  daily. 02/17/20  Yes Janith Lima, MD  Cholecalciferol 50 MCG (2000 UT) TABS Take 1 tablet (2,000 Units total) by mouth daily. 02/17/20  Yes Janith Lima, MD  dicyclomine (BENTYL) 10 MG capsule Take 1 capsule (10 mg total) by mouth 4 (four) times daily -  before meals and at bedtime. 02/17/20  Yes Janith Lima, MD  dorzolamide (TRUSOPT) 2 % ophthalmic solution Place 1 drop into both eyes 3 (three) times daily. 02/06/17  Yes [provider]  doxycycline (VIBRA-TABS) 100 MG tablet Take 1 tablet (100 mg total) by mouth 2 (two) times daily. 12/24/19  Yes Marrian Salvage, FNP  escitalopram (LEXAPRO) 20 MG tablet Take 1 tablet (20 mg total) by mouth daily. 12/02/19  Yes Janith Lima, MD  furosemide (LASIX) 20 MG tablet Take 1 tablet (20 mg total) by mouth daily. 02/17/20  Yes Janith Lima, MD  icosapent Ethyl (VASCEPA) 1 g capsule Take 2 capsules (2 g total) by mouth 2 (two) times daily. 12/02/19  Yes Janith Lima, MD  levothyroxine (SYNTHROID) 25 MCG tablet TAKE 1 TABLET BY MOUTH ONCE DAILY BEFORE BREAKFAST 02/17/20  Yes Janith Lima, MD  NON FORMULARY Vit b12 nasal spray-use weekly    Yes [provider]  potassium chloride SA (KLOR-CON) 20 MEQ tablet Take 1 tablet (20 mEq total) by mouth 2 (two) times daily. 02/17/20  Yes Janith Lima, MD  predniSONE (DELTASONE) 20 MG tablet Take 1 tablet (20 mg total) by mouth daily with breakfast. 12/24/19  Yes Marrian Salvage, FNP     Positive ROS: Otherwise negative  All other systems have been reviewed and were otherwise negative with the exception of those mentioned in the HPI and as above.  Physical Exam: Constitutional: Alert, well-appearing, no acute distress Ears: External ears without lesions or tenderness. Ear canals are clear bilaterally.  TMs appeared clear with no obvious middle ear effusion noted on otoscopic exam.  On hearing screening with the [phone] tuning fork she heard about the same in both ears  with AC > BC bilaterally.  She does not notice any substantial hearing problems. Nasal: External nose without lesions. Septum with minimal deformity.  Mild rhinitis with clear mucus discharge.  Both middle meatus regions are clear.. Clear nasal passages Oral: Lips and gums without lesions. Tongue and palate mucosa without lesions. Posterior oropharynx clear.  She is status post tonsillectomy with clear oropharyngeal mucosa. Neck: No palpable adenopathy or masses.  She does have TMJ dysfunction with popping and crackling  in both TMJ joints. Respiratory: Breathing comfortably  Skin: No facial/neck lesions or rash noted.  Procedures  Assessment: Chronic rhinitis with eustachian tube dysfunction. History of dizziness.  Plan: Recommended use of Flonase 2 sprays each nostril at night which she already has.  Recommended using this regularly for the next 2 weeks and if she still has sensation of fluid in her ears she will call us back to schedule audiologic testing and follow-up visit. Some of her symptoms may be related to TMJ dysfunction.   Radene Journey, MD

## 2020-04-17 DIAGNOSIS — N8111 Cystocele, midline: Secondary | ICD-10-CM | POA: Diagnosis not present

## 2020-05-07 ENCOUNTER — Ambulatory Visit: Payer: Self-pay | Admitting: Family Medicine

## 2020-05-29 ENCOUNTER — Encounter: Payer: Self-pay | Admitting: Internal Medicine

## 2020-05-29 ENCOUNTER — Telehealth (INDEPENDENT_AMBULATORY_CARE_PROVIDER_SITE_OTHER): Payer: PPO | Admitting: Internal Medicine

## 2020-05-29 DIAGNOSIS — J410 Simple chronic bronchitis: Secondary | ICD-10-CM | POA: Diagnosis not present

## 2020-05-29 MED ORDER — DOXYCYCLINE HYCLATE 100 MG PO TABS
100.0000 mg | ORAL_TABLET | Freq: Two times a day (BID) | ORAL | 0 refills | Status: DC
Start: 2020-05-29 — End: 2020-07-20

## 2020-05-29 MED ORDER — BENZONATATE 200 MG PO CAPS
200.0000 mg | ORAL_CAPSULE | Freq: Three times a day (TID) | ORAL | 0 refills | Status: DC | PRN
Start: 2020-05-29 — End: 2020-11-25

## 2020-05-29 MED ORDER — PREDNISONE 20 MG PO TABS: 20.0000 mg | ORAL_TABLET | Freq: Every day | ORAL | 0 refills | Status: DC

## 2020-05-29 NOTE — Assessment & Plan Note (Signed)
Appears to be flare. Rx doxycycline, tessalon perles and prednisone. Has old albuterol inhaler which she has never used.

## 2020-05-29 NOTE — Progress Notes (Signed)
Virtual Visit via Video Note  I connected with [name] on 05/29/20 at  3:20 PM EST by a video enabled telemedicine application and verified that I am speaking with the correct person using two identifiers.  The patient and the provider were at separate locations throughout the entire encounter. Patient location: home, Provider location: work   I discussed the limitations of evaluation and management by telemedicine and the availability of in person appointments. The patient expressed understanding and agreed to proceed. The patient and the provider were the only parties present for the visit unless noted in HPI below.  History of Present Illness: The patient is a 79 y.o. female with visit for cough cold symptoms. Nose and right ear hurting. Voice is hoarse with coughing. Started yesterday. Dry cough. Denies fevers or chills. Denies SOB. No other sick contacts. Overall it is stable. Vaccinated against covid-19 including booster.   Observations/Objective: Appearance: normal, breathing appears normal, coughing during visit, no dyspnea and talking in full sentences, casual grooming, mental status is A and O times 3  Assessment and Plan: See problem oriented charting  Follow Up Instructions: rx doxycycline and prednisone and tessalon perles, covid-19 testing if no improvement in 2-3 days  I discussed the assessment and treatment plan with the patient. The patient was provided an opportunity to ask questions and all were answered. The patient agreed with the plan and demonstrated an understanding of the instructions.   The patient was advised to call back or seek an in-person evaluation if the symptoms worsen or if the condition fails to improve as anticipated.  Hoyt Koch, MD

## 2020-06-22 ENCOUNTER — Other Ambulatory Visit: Payer: Self-pay | Admitting: Internal Medicine

## 2020-06-22 DIAGNOSIS — I10 Essential (primary) hypertension: Secondary | ICD-10-CM

## 2020-06-22 DIAGNOSIS — F411 Generalized anxiety disorder: Secondary | ICD-10-CM

## 2020-06-23 ENCOUNTER — Other Ambulatory Visit (HOSPITAL_COMMUNITY): Payer: Self-pay | Admitting: Urology

## 2020-06-23 DIAGNOSIS — N281 Cyst of kidney, acquired: Secondary | ICD-10-CM

## 2020-06-30 ENCOUNTER — Ambulatory Visit (HOSPITAL_COMMUNITY)
Admission: RE | Admit: 2020-06-30 | Discharge: 2020-06-30 | Disposition: A | Discharge status: Home / Self Care | Payer: PPO | Source: Ambulatory Visit | Attending: Urology | Admitting: Urology

## 2020-06-30 ENCOUNTER — Other Ambulatory Visit: Payer: Self-pay

## 2020-06-30 DIAGNOSIS — N281 Cyst of kidney, acquired: Secondary | ICD-10-CM | POA: Diagnosis not present

## 2020-06-30 DIAGNOSIS — N2889 Other specified disorders of kidney and ureter: Secondary | ICD-10-CM | POA: Diagnosis not present

## 2020-07-13 DIAGNOSIS — N281 Cyst of kidney, acquired: Secondary | ICD-10-CM | POA: Diagnosis not present

## 2020-07-20 ENCOUNTER — Other Ambulatory Visit (INDEPENDENT_AMBULATORY_CARE_PROVIDER_SITE_OTHER): Payer: PPO

## 2020-07-20 ENCOUNTER — Other Ambulatory Visit: Payer: Self-pay

## 2020-07-20 ENCOUNTER — Telehealth (INDEPENDENT_AMBULATORY_CARE_PROVIDER_SITE_OTHER): Payer: PPO | Admitting: Family

## 2020-07-20 DIAGNOSIS — R197 Diarrhea, unspecified: Secondary | ICD-10-CM | POA: Diagnosis not present

## 2020-07-20 LAB — COMPREHENSIVE METABOLIC PANEL
ALT: 22 U/L (ref 0–35)
AST: 27 U/L (ref 0–37)
Albumin: 4.5 g/dL (ref 3.5–5.2)
Alkaline Phosphatase: 120 U/L — ABNORMAL HIGH (ref 39–117)
BUN: 14 mg/dL (ref 6–23)
CO2: 30 mEq/L (ref 19–32)
Calcium: 9.6 mg/dL (ref 8.4–10.5)
Chloride: 105 mEq/L (ref 96–112)
Creatinine, Ser: 0.83 mg/dL (ref 0.40–1.20)
GFR: 67.04 mL/min (ref >60.00)
Glucose, Bld: 88 mg/dL (ref 70–99)
Potassium: 3.6 mEq/L (ref 3.5–5.1)
Sodium: 139 mEq/L (ref 135–145)
Total Bilirubin: 0.5 mg/dL (ref 0.2–1.2)
Total Protein: 7.1 g/dL (ref 6.0–8.3)

## 2020-07-20 LAB — CBC WITH DIFFERENTIAL/PLATELET
Basophils Absolute: 0 10*3/uL (ref 0.0–0.1)
Basophils Relative: 0.4 % (ref 0.0–3.0)
Eosinophils Absolute: 0 10*3/uL (ref 0.0–0.7)
Eosinophils Relative: 0.9 % (ref 0.0–5.0)
HCT: 42.1 % (ref 36.0–46.0)
Hemoglobin: 14.3 g/dL (ref 12.0–15.0)
Lymphocytes Relative: 21 % (ref 12.0–46.0)
Lymphs Abs: 1.2 10*3/uL (ref 0.7–4.0)
MCHC: 34 g/dL (ref 30.0–36.0)
MCV: 85.9 fl (ref 78.0–100.0)
Monocytes Absolute: 0.4 10*3/uL (ref 0.1–1.0)
Monocytes Relative: 7 % (ref 3.0–12.0)
Neutro Abs: 3.9 10*3/uL (ref 1.4–7.7)
Neutrophils Relative %: 70.7 % (ref 43.0–77.0)
Platelets: 144 10*3/uL — ABNORMAL LOW (ref 150.0–400.0)
RBC: 4.91 Mil/uL (ref 3.87–5.11)
RDW: 13.9 % (ref 11.5–15.5)
WBC: 5.5 10*3/uL (ref 4.0–10.5)

## 2020-07-20 LAB — LIPASE: Lipase: 44 U/L (ref 11.0–59.0)

## 2020-07-20 LAB — AMYLASE: Amylase: 25 U/L — ABNORMAL LOW (ref 27–131)

## 2020-07-20 MED ORDER — METRONIDAZOLE 500 MG PO TABS: 500.0000 mg | ORAL_TABLET | Freq: Three times a day (TID) | ORAL | 0 refills | Status: DC

## 2020-07-20 NOTE — Progress Notes (Signed)
[name] is a 80 y.o. female with the following history as recorded in EpicCare:  Patient Active Problem List   Diagnosis Date Noted  . Statin intolerance 12/03/2019  . Acquired hypothyroidism 12/02/2019  . Need for hepatitis C screening test 12/02/2019  . Loud snoring 12/02/2019  . Pure hypertriglyceridemia 02/28/2019  . Reactive airway disease 06/23/2017  . TIA (transient ischemic attack) 02/28/2017  . Depression with somatization 12/13/2016  . Allergic rhinitis due to pollen 06/21/2016  . Simple chronic bronchitis ([place]) 06/21/2016  . Routine general medical examination at a health care facility 11/29/2015  . GAD (generalized anxiety disorder) 12/02/2014  . IBS (irritable bowel syndrome) 01/20/2014  . B12 deficiency anemia 07/10/2013  . Vitamin D deficiency 01/04/2013  . HTN (hypertension) 07/26/2011  . CAD (coronary artery disease) 07/26/2011  . Nonalcoholic steatohepatitis (NASH) 06/20/2009  . OBESITY 09/08/2007  . OSTEOPENIA 09/07/2007  . Hyperlipidemia with target LDL less than 100 07/30/2007    Current Outpatient Medications  Medication Sig Dispense Refill  . metroNIDAZOLE (FLAGYL) 500 MG tablet Take 1 tablet (500 mg total) by mouth 3 (three) times daily. 21 tablet 0  . acetaminophen (TYLENOL) 500 MG tablet Take 500 mg by mouth every 6 (six) hours as needed.    . ALPRAZolam (XANAX) 0.5 MG tablet TAKE 1 TABLET BY MOUTH THREE TIMES DAILY AS NEEDED FOR ANXIETY 195 tablet 0  . benzonatate (TESSALON) 200 MG capsule Take 1 capsule (200 mg total) by mouth 3 (three) times daily as needed. 30 capsule 0  . brimonidine (ALPHAGAN) 0.15 % ophthalmic solution Place 1 drop into both eyes 3 (three) times daily.  3  . cetirizine (ZYRTEC) 10 MG tablet Take 1 tablet (10 mg total) by mouth daily. 90 tablet 3  . Cholecalciferol 50 MCG (2000 UT) TABS Take 1 tablet (2,000 Units total) by mouth daily. 90 tablet 0  . dicyclomine (BENTYL) 10 MG capsule Take 1 capsule (10 mg total) by mouth 4  (four) times daily -  before meals and at bedtime. 360 capsule 1  . dorzolamide (TRUSOPT) 2 % ophthalmic solution Place 1 drop into both eyes 3 (three) times daily.  1  . escitalopram (LEXAPRO) 20 MG tablet Take 1 tablet (20 mg total) by mouth daily. 90 tablet 1  . furosemide (LASIX) 20 MG tablet TAKE 1 TABLET BY MOUTH IN THE MORNING 90 tablet 0  . icosapent Ethyl (VASCEPA) 1 g capsule Take 2 capsules (2 g total) by mouth 2 (two) times daily. 360 capsule 1  . levothyroxine (SYNTHROID) 25 MCG tablet TAKE 1 TABLET BY MOUTH ONCE DAILY BEFORE BREAKFAST 90 tablet 1  . NON FORMULARY Vit b12 nasal spray-use weekly     . potassium chloride SA (KLOR-CON) 20 MEQ tablet Take 1 tablet (20 mEq total) by mouth 2 (two) times daily. 180 tablet 0   No current facility-administered medications for this visit.    Allergies: Diltiazem hcl, Statins, Zetia [ezetimibe], Codeine, Sulfonamide derivatives, Tape, and Tramadol  Past Medical History:  Diagnosis Date  . Anxiety state, unspecified   . CAD (coronary artery disease)   . Disorder of bone and cartilage, unspecified   . Diverticulosis of colon (without mention of hemorrhage)   . Dizziness and giddiness   . Fatty liver   . Fibromyalgia   . Obesity, unspecified   . Other and unspecified hyperlipidemia   . Other chronic nonalcoholic liver disease   . Skin cancer    basal cell on face  . Stroke [facility])  TIA  . TIA (transient ischemic attack)   . Unspecified essential hypertension     Past Surgical History:  Procedure Laterality Date  . BACK SURGERY    . CHOLECYSTECTOMY    . MOHS SURGERY    . TONSILLECTOMY AND ADENOIDECTOMY    . TOTAL ABDOMINAL HYSTERECTOMY W/ BILATERAL SALPINGOOPHORECTOMY      Family History  Problem Relation Age of Onset  . Stomach cancer Mother   . Cancer Brother   . Breast cancer Sister   . Other Father   . Breast cancer Sister     Social History   Tobacco Use  . Smoking status: Never Smoker  . Smokeless tobacco: Never  Used  Substance Use Topics  . Alcohol use: No    Subjective:   I connected with [name] on 07/20/20 at  3:20 PM EST by a telephone call and verified that I am speaking with the correct person using two identifiers.   I discussed the limitations of evaluation and management by telemedicine and the availability of in person appointments. The patient expressed understanding and agreed to proceed. Provider in office/ patient is at home; provider and patient are only 2 people on telephone call.   History of recurrent diarrhea; notes she has had problems for the entire month of January; states that symptoms have become much worse in the past 3 days- "cannot keep anything in me." Denies any abdominal pain or cramping; no fever; no concerns for COVID exposure- her family had Arom[place] in December;   Patient notes she had a similar episode in 2015 and is asking for the medication given at that time to be given again;     Objective:  There were no vitals filed for this visit.  Lungs: Respirations unlabored;  Neurologic: Alert and oriented; speech intact;   Assessment:  1. Diarrhea, unspecified type     Plan:   Symptomatic on and off for the past month- worse in the past 3 days; she agrees to come get labs today and will check CBC, CMP, amylase, lipase, stool culture; BRAT diet discussed- may need to consider IV fluids if symptoms persist; Rx for Flagyl 500 mg tid x 7 days to try and help manage; follow-up to be determined;  Time spent 15 minutes   No follow-ups on file.  Orders Placed This Encounter  Procedures  . CBC with Differential/Platelet    Standing Status:   Future    Number of Occurrences:   1    Standing Expiration Date:   07/20/2021  . Comp Met (CMET)    Standing Status:   Future    Number of Occurrences:   1    Standing Expiration Date:   07/20/2021  . Amylase    Standing Status:   Future    Number of Occurrences:   1    Standing Expiration Date:   07/20/2021  .  Lipase    Standing Status:   Future    Number of Occurrences:   1    Standing Expiration Date:   07/20/2021  . Gastrointestinal Pathogen Panel PCR    Standing Status:   Future    Standing Expiration Date:   07/20/2021    Requested Prescriptions   Signed Prescriptions Disp Refills  . metroNIDAZOLE (FLAGYL) 500 MG tablet 21 tablet 0    Sig: Take 1 tablet (500 mg total) by mouth 3 (three) times daily.

## 2020-07-24 ENCOUNTER — Other Ambulatory Visit: Payer: Self-pay | Admitting: Family

## 2020-07-24 DIAGNOSIS — H2513 Age-related nuclear cataract, bilateral: Secondary | ICD-10-CM | POA: Diagnosis not present

## 2020-07-24 DIAGNOSIS — H401131 Primary open-angle glaucoma, bilateral, mild stage: Secondary | ICD-10-CM | POA: Diagnosis not present

## 2020-07-24 DIAGNOSIS — R197 Diarrhea, unspecified: Secondary | ICD-10-CM

## 2020-07-24 DIAGNOSIS — H04123 Dry eye syndrome of bilateral lacrimal glands: Secondary | ICD-10-CM | POA: Diagnosis not present

## 2020-07-24 LAB — GI PROFILE, STOOL, PCR

## 2020-07-24 MED ORDER — DIPHENOXYLATE-ATROPINE 2.5-0.025 MG PO TABS: 1.0000 | ORAL_TABLET | Freq: Four times a day (QID) | ORAL | 0 refills | Status: DC | PRN

## 2020-08-01 ENCOUNTER — Other Ambulatory Visit: Payer: Self-pay | Admitting: Internal Medicine

## 2020-08-01 DIAGNOSIS — I10 Essential (primary) hypertension: Secondary | ICD-10-CM

## 2020-08-13 ENCOUNTER — Telehealth: Payer: PPO

## 2020-08-21 ENCOUNTER — Ambulatory Visit: Payer: PPO | Admitting: Gastroenterology

## 2020-08-21 ENCOUNTER — Encounter: Payer: Self-pay | Admitting: Gastroenterology

## 2020-08-21 VITALS — BP 132/78 | HR 71 | Ht 63.0 in | Wt 175.0 lb

## 2020-08-21 DIAGNOSIS — K9089 Other intestinal malabsorption: Secondary | ICD-10-CM | POA: Diagnosis not present

## 2020-08-21 DIAGNOSIS — K580 Irritable bowel syndrome with diarrhea: Secondary | ICD-10-CM

## 2020-08-21 MED ORDER — CHOLESTYRAMINE 4 G PO PACK: 4.0000 g | PACK | Freq: Every day | ORAL | 5 refills | Status: DC

## 2020-08-21 NOTE — Patient Instructions (Signed)
If you are age 80 or older, your body mass index should be between 23-30. Your Body mass index is 31 kg/m. If this is out of the aforementioned range listed, please consider follow up with your Primary Care Provider.  If you are age 108 or younger, your body mass index should be between 19-25. Your Body mass index is 31 kg/m. If this is out of the aformentioned range listed, please consider follow up with your Primary Care Provider.   We have sent the following medications to your pharmacy for you to pick up at your convenience: Questran 1 packet at lunch time daily.   Start probiotic daily such as Radio producer.

## 2020-08-21 NOTE — Progress Notes (Signed)
08/21/2020 [name] [id number] [birth year]   HISTORY OF PRESENT ILLNESS: This is a 80 year old female who is a patient of Dr. Doyne Keel.  She is really only known to him for colonoscopy.  Her last colonoscopy was in November 2019 at which time she was found to have diverticulosis, a single angiodysplastic colonic lesion, and 6 polyps that were removed.  These were tubular adenomas, sessile serrated polyps, and inflammatory polyps.  Repeat was recommended a 3-year interval depending on her health at that time due to her age as she will be 80 years old when she is due for that study later this year.  She is here today with complaints of diarrhea.  She has had some issues with diarrhea in the past, but on January 16 this became much more frequent and more severe.  She saw her PCP and lab studies were unremarkable.  Stool GI pathogen panel was negative.  They gave her empiric treatment of Flagyl 500 mg 3 times a day for 7 days.  They also prescribed Lomotil.  She has dicyclomine on her medication list as well.  She does not have a gallbladder.  She has noticed some improvement in her symptoms.  She denies seeing blood in her stool.  She also reports right-sided abdominal pain has been present for the past 2 to 3 years.  She had a CT scan of the abdomen and pelvis with contrast in May 2021 that was ordered by her PCP for evaluation of this.  This did not show any cause of her pain.  Past Medical History:  Diagnosis Date  . Anxiety state, unspecified   . CAD (coronary artery disease)   . Disorder of bone and cartilage, unspecified   . Diverticulosis of colon (without mention of hemorrhage)   . Dizziness and giddiness   . Fatty liver   . Fibromyalgia   . Obesity, unspecified   . Other and unspecified hyperlipidemia   . Other chronic nonalcoholic liver disease   . Skin cancer    basal cell on face  . Stroke [facility])    TIA  . TIA (transient ischemic attack)   . Unspecified essential  hypertension    Past Surgical History:  Procedure Laterality Date  . BACK SURGERY    . CHOLECYSTECTOMY    . MOHS SURGERY    . TONSILLECTOMY AND ADENOIDECTOMY    . TOTAL ABDOMINAL HYSTERECTOMY W/ BILATERAL SALPINGOOPHORECTOMY      reports that she has never smoked. She has never used smokeless tobacco. She reports that she does not drink alcohol and does not use drugs. family history includes Breast cancer in her sister and sister; Cancer in her brother; Other in her father; Stomach cancer in her mother. Allergies  Allergen Reactions  . Diltiazem Hcl Other (See Comments)    REACTION: pt states it made her dizzy...  . Statins     Muscle aches  . Zetia [Ezetimibe]     "it made me feel bad"  . Codeine Nausea Only  . Sulfonamide Derivatives Hives, Itching and Rash  . Tape Itching  . Tramadol Itching      Outpatient Encounter Medications as of 08/21/2020  Medication Sig  . acetaminophen (TYLENOL) 500 MG tablet Take 500 mg by mouth every 6 (six) hours as needed.  . ALPRAZolam (XANAX) 0.5 MG tablet TAKE 1 TABLET BY MOUTH THREE TIMES DAILY AS NEEDED FOR ANXIETY  . brimonidine (ALPHAGAN) 0.15 % ophthalmic solution Place 1 drop into both eyes 3 (  three) times daily.  . cetirizine (ZYRTEC) 10 MG tablet Take 1 tablet (10 mg total) by mouth daily.  Marland Kitchen dicyclomine (BENTYL) 10 MG capsule Take 1 capsule (10 mg total) by mouth 4 (four) times daily -  before meals and at bedtime.  . diphenoxylate-atropine (LOMOTIL) 2.5-0.025 MG tablet Take 1 tablet by mouth 4 (four) times daily as needed for diarrhea or loose stools.  . dorzolamide (TRUSOPT) 2 % ophthalmic solution Place 1 drop into both eyes 3 (three) times daily.  Marland Kitchen escitalopram (LEXAPRO) 20 MG tablet Take 1 tablet (20 mg total) by mouth daily.  . furosemide (LASIX) 20 MG tablet TAKE 1 TABLET BY MOUTH IN THE MORNING  . icosapent Ethyl (VASCEPA) 1 g capsule Take 2 capsules (2 g total) by mouth 2 (two) times daily.  Marland Kitchen levothyroxine (SYNTHROID) 25 MCG  tablet TAKE 1 TABLET BY MOUTH ONCE DAILY BEFORE BREAKFAST  . Lifitegrast (XIIDRA) 5 % SOLN Apply 2 drops to eye 2 (two) times daily.  . metroNIDAZOLE (FLAGYL) 500 MG tablet Take 1 tablet (500 mg total) by mouth 3 (three) times daily.  . potassium chloride SA (KLOR-CON) 20 MEQ tablet TAKE 1  BY MOUTH IN THE MORNING AND 1 AT BEDTIME  . benzonatate (TESSALON) 200 MG capsule Take 1 capsule (200 mg total) by mouth 3 (three) times daily as needed. (Patient not taking: Reported on 08/21/2020)  . [DISCONTINUED] Cholecalciferol 50 MCG (2000 UT) TABS Take 1 tablet (2,000 Units total) by mouth daily.  . [DISCONTINUED] NON FORMULARY Vit b12 nasal spray-use weekly    No facility-administered encounter medications on file as of 08/21/2020.     REVIEW OF SYSTEMS  : All other systems reviewed and negative except where noted in the History of Present Illness.   PHYSICAL EXAM: BP 132/78   Pulse 71   Ht 5\' 3"  (1.6 m)   Wt 175 lb (79.4 kg)   SpO2 96%   BMI 31.00 kg/m  General: Well developed white female in no acute distress Head: Normocephalic and atraumatic Eyes:  Sclerae anicteric, conjunctiva pink. Ears: Normal auditory acuity Lungs: Clear throughout to auscultation; no W/R/R. Heart: Regular rate and rhythm; no M/R/G. Abdomen: Soft, non-distended.  BS present.  Non-tender. Musculoskeletal: Symmetrical with no gross deformities  Skin: No lesions on visible extremities Extremities: No edema  Neurological: Alert oriented x 4, grossly non-focal Psychological:  Alert and cooperative. Normal mood and affect  ASSESSMENT AND PLAN: *Diarrhea: Has history of some diarrhea issues.  January 16 diarrhea became much more frequent and more severe.  Stool studies were negative.  Labs unremarkable.  Was treated empirically with Flagyl 3 times a day for 7 days by her PCP.  Was also given Lomotil to use.  Sounds like she probably has some underlying IBS and may be even some bile salt related diarrhea and possibly has  a flare of those issues.  We will try Questran powder, 1 packet at lunchtime daily.  Can increase if needed.  Prescription sent to pharmacy.  I also recommended daily probiotic such as align or Florastor in case this was something infectious and that will help to replenish the normal gut flora.  She will call us back in about 1 month with an update on her symptoms.   CC:  Janith Lima, MD

## 2020-08-24 ENCOUNTER — Telehealth: Payer: Self-pay | Admitting: Pharmacist

## 2020-08-24 NOTE — Progress Notes (Addendum)
Chronic Care Management Pharmacy Assistant   Name: [name]  MRN: [id number] DOB: [birth year]   Reason for Encounter: Cholesterol Adherence Call   Conditions to be addressed/monitored: HLD  Primary concerns for visit include: To see if patient on Vascepa   Recent office visits:  08/21/20, patient had visit with Alonza Bogus PA   Recent consult visits:  None  Hospital visits:  None in previous 6 months  Medications: Outpatient Encounter Medications as of 08/24/2020  Medication Sig   acetaminophen (TYLENOL) 500 MG tablet Take 500 mg by mouth every 6 (six) hours as needed.   ALPRAZolam (XANAX) 0.5 MG tablet TAKE 1 TABLET BY MOUTH THREE TIMES DAILY AS NEEDED FOR ANXIETY   benzonatate (TESSALON) 200 MG capsule Take 1 capsule (200 mg total) by mouth 3 (three) times daily as needed. (Patient not taking: Reported on 08/21/2020)   brimonidine (ALPHAGAN) 0.15 % ophthalmic solution Place 1 drop into both eyes 3 (three) times daily.   cetirizine (ZYRTEC) 10 MG tablet Take 1 tablet (10 mg total) by mouth daily.   cholestyramine (QUESTRAN) 4 g packet Take 1 packet (4 g total) by mouth daily.   dicyclomine (BENTYL) 10 MG capsule Take 1 capsule (10 mg total) by mouth 4 (four) times daily -  before meals and at bedtime.   diphenoxylate-atropine (LOMOTIL) 2.5-0.025 MG tablet Take 1 tablet by mouth 4 (four) times daily as needed for diarrhea or loose stools.   dorzolamide (TRUSOPT) 2 % ophthalmic solution Place 1 drop into both eyes 3 (three) times daily.   escitalopram (LEXAPRO) 20 MG tablet Take 1 tablet (20 mg total) by mouth daily.   furosemide (LASIX) 20 MG tablet TAKE 1 TABLET BY MOUTH IN THE MORNING   levothyroxine (SYNTHROID) 25 MCG tablet TAKE 1 TABLET BY MOUTH ONCE DAILY BEFORE BREAKFAST   Lifitegrast (XIIDRA) 5 % SOLN Apply 2 drops to eye 2 (two) times daily.   potassium chloride SA (KLOR-CON) 20 MEQ tablet TAKE 1  BY MOUTH IN THE MORNING AND 1 AT BEDTIME   No  facility-administered encounter medications on file as of 08/24/2020.    Care Gaps: None  Star Rating Drugs: No ACE/ARBs   08/24/2020 Name: [name] MRN: [id number] DOB: 1941/05/17 [name] is a 80 y.o. year old female who is a primary care patient of Janith Lima, MD.  Comprehensive medication review performed; Spoke to patient regarding cholesterol  Lipid Panel    Component Value Date/Time   CHOL 234 (H) 12/02/2019 0949   TRIG 124.0 12/02/2019 0949   HDL 42.50 12/02/2019 0949   LDLCALC 167 (H) 12/02/2019 0949   LDLDIRECT 174.0 02/27/2019 1639    10-year ASCVD risk score: The 10-year ASCVD risk score Mikey Bussing DC Brooke Bonito., et al., 2013) is: 31.2%   Values used to calculate the score:     Age: 80 years     Sex: Female     Is Non-Hispanic African American: No     Diabetic: No     Tobacco smoker: No     Systolic Blood Pressure: 132 mmHg     Is BP treated: Yes     HDL Cholesterol: 42.5 mg/dL     Total Cholesterol: 234 mg/dL  Current antihyperlipidemic regimen:The patient states that she is not taking any meds for cholesterol   Previous antihyperlipidemic medications tried: None  ASCVD risk enhancing conditions: None  What recent interventions/DTPs have been made by any provider to improve Cholesterol control since last CPP Visit:  The patient was to get patient assistance for vascepa, she states that she would like to assistance with getting this medication if possible  Any recent hospitalizations or ED visits since last visit with CPP? The patient has had no visits to the hospital or ED  What diet changes have been made to improve Cholesterol?  The patient is eating more fruits and vegetables  What exercise is being done to improve Cholesterol? The patient states that she gets plenty of exercise keeping her 2 yr old grandchild   Adherence Review: Does the patient have >5 day gap between last estimated fill dates? No   Wendy Poet, [place] [phone]

## 2020-08-25 NOTE — Telephone Encounter (Signed)
Patient has not been taking Vascepa, even though she has [employer] to cover her copays.  [employer] copay card info (approved through 01/16/2021) RX BIN: [id number] RX PCN: [id number] RX GRP: [id number] ID: [id number]  Set up CCM f/u visit to discuss.

## 2020-08-31 ENCOUNTER — Other Ambulatory Visit: Payer: Self-pay | Admitting: Internal Medicine

## 2020-08-31 DIAGNOSIS — F411 Generalized anxiety disorder: Secondary | ICD-10-CM

## 2020-09-03 ENCOUNTER — Encounter: Payer: Self-pay | Admitting: Gastroenterology

## 2020-09-03 ENCOUNTER — Ambulatory Visit (INDEPENDENT_AMBULATORY_CARE_PROVIDER_SITE_OTHER): Payer: PPO | Admitting: Pharmacist

## 2020-09-03 ENCOUNTER — Other Ambulatory Visit: Payer: Self-pay

## 2020-09-03 ENCOUNTER — Telehealth: Payer: Self-pay

## 2020-09-03 DIAGNOSIS — K9089 Other intestinal malabsorption: Secondary | ICD-10-CM | POA: Insufficient documentation

## 2020-09-03 DIAGNOSIS — I2510 Atherosclerotic heart disease of native coronary artery without angina pectoris: Secondary | ICD-10-CM

## 2020-09-03 DIAGNOSIS — I10 Essential (primary) hypertension: Secondary | ICD-10-CM | POA: Diagnosis not present

## 2020-09-03 DIAGNOSIS — K582 Mixed irritable bowel syndrome: Secondary | ICD-10-CM

## 2020-09-03 DIAGNOSIS — E785 Hyperlipidemia, unspecified: Secondary | ICD-10-CM | POA: Diagnosis not present

## 2020-09-03 NOTE — Telephone Encounter (Signed)
-----   Message from Loralie Champagne, PA-C sent at 09/03/2020 11:26 AM EDT ----- Will you please call to check up on this patient.  I had given her Questran to begin taking daily and had recommended a probiotic.  I saw her about 2 weeks ago.  Will you see if she is initiated those and if so how her diarrhea has been?  Thank you,  Jess

## 2020-09-03 NOTE — Telephone Encounter (Signed)
I spoke with the pt and she is taking the Questran but only as needed. She says it works really well but does cause constipation if she takes it daily.  She will call back if she has any further issues.

## 2020-09-03 NOTE — Progress Notes (Signed)
Agree with assessment and plan as outlined.  

## 2020-09-03 NOTE — Progress Notes (Signed)
Chronic Care Management Pharmacy Note  09/04/2020 Name:  [name] MRN:  [id number] DOB:  Sep 24, 1940  Subjective: [name] is an 80 y.o. year old female who is a primary patient of Janith Lima, MD.  The CCM team was consulted for assistance with disease management and care coordination needs.    Engaged with patient by telephone for follow up visit in response to provider referral for pharmacy case management and/or care coordination services.   Consent to Services:  The patient was given information about Chronic Care Management services, agreed to services, and gave verbal consent prior to initiation of services.  Please see initial visit note for detailed documentation.   Patient Care Team: Janith Lima, MD as PCP - General (Internal Medicine) Charlton Haws, [facility] as Pharmacist (Pharmacist)  Recent office visits: 07/20/20 NP Jodi Mourning VV: c/o diarrhea, rx'd metronidazole. Rx'd Lomotil #20 and GI referral.  12/02/19 Dr Ronnald Ramp OV: CPE. LDL too high, pt has declined statin.   Recent consult visits: 08/21/20 PA Janett Billow Zehr (GI): eval for IBS-D. Started cholestyramine 4 gm packet. Updated Vascepa Rx to 2 g BID. DC'd Vitamin D, metronidazole, Vit B12 nasal spray. Rec'd Probiotic daily.  07/13/20 Dr Milford Cage (urology): f/u cyst of kidney  03/13/20 Dr Lucia Gaskins (ENT): f/u ear pain. Rec'd Flonase.  Hospital visits: None in previous 6 months  Objective:  Lab Results  Component Value Date   CREATININE 0.83 07/20/2020   BUN 14 07/20/2020   GFR 67.04 07/20/2020   GFRNONAA >60 07/06/2017   GFRAA >60 07/06/2017   NA 139 07/20/2020   K 3.6 07/20/2020   CALCIUM 9.6 07/20/2020   CO2 30 07/20/2020   GLUCOSE 88 07/20/2020    Lab Results  Component Value Date/Time   HGBA1C 4.9 03/01/2017 06:24 AM   HGBA1C 5.1 07/06/2012 11:24 AM   GFR 67.04 07/20/2020 04:14 PM   GFR 62.01 12/02/2019 09:49 AM    Last diabetic Eye exam: No results found for: HMDIABEYEEXA  Last  diabetic Foot exam: No results found for: HMDIABFOOTEX   Lab Results  Component Value Date   CHOL 234 (H) 12/02/2019   HDL 42.50 12/02/2019   LDLCALC 167 (H) 12/02/2019   LDLDIRECT 174.0 02/27/2019   TRIG 124.0 12/02/2019   CHOLHDL 6 12/02/2019    Hepatic Function Latest Ref Rng & Units 07/20/2020 12/02/2019 10/08/2019  Total Protein 6.0 - 8.3 g/dL 7.1 7.2 7.2  Albumin 3.5 - 5.2 g/dL 4.5 4.5 4.5  AST 0 - 37 U/L _0 ALT 0 - 35 U/L _1 Alk Phosphatase 39 - 117 U/L 120(H) 112 145(H)  Total Bilirubin 0.2 - 1.2 mg/dL 0.5 0.6 0.6  Bilirubin, Direct 0.0 - 0.3 mg/dL - 0.1 -    Lab Results  Component Value Date/Time   TSH 2.83 12/02/2019 09:49 AM   TSH 5.56 (H) 10/08/2019 12:20 PM    CBC Latest Ref Rng & Units 07/20/2020 12/02/2019 10/08/2019  WBC 4.0 - 10.5 K/uL 5.5 4.4 4.4  Hemoglobin 12.0 - 15.0 g/dL 14.3 14.2 14.7  Hematocrit 36.0 - 46.0 % 42.1 42.7 44.0  Platelets 150.0 - 400.0 K/uL 144.0(L) 116.0(L) 148.0(L)    Lab Results  Component Value Date/Time   VD25OH 24.51 (L) 02/27/2019 04:39 PM   VD25OH 41.68 01/13/2014 11:15 AM    Clinical ASCVD: No  The 10-year ASCVD risk score Mikey Bussing DC Jr., et al., 2013) is: 31.2%   Values used to calculate the score:  Age: 23 years     Sex: Female     Is Non-Hispanic African American: No     Diabetic: No     Tobacco smoker: No     Systolic Blood Pressure: 956 mmHg     Is BP treated: Yes     HDL Cholesterol: 42.5 mg/dL     Total Cholesterol: 234 mg/dL    Depression screen [facility] 2/9 12/02/2019 12/02/2019 08/05/2019  Decreased Interest 0 0 0  Down, Depressed, Hopeless 0 0 0  PHQ - 2 Score 0 0 0  Altered sleeping - 0 -  Tired, decreased energy - 0 -  Change in appetite - 1 -  Feeling bad or failure about yourself  - 0 -  Trouble concentrating - 1 -  Moving slowly or fidgety/restless - 0 -  Suicidal thoughts - 0 -  PHQ-9 Score - 2 -  Difficult doing work/chores - Not difficult at all -  Some recent data might be hidden      Social History   Tobacco Use  Smoking Status Never Smoker  Smokeless Tobacco Never Used   BP Readings from Last 3 Encounters:  08/21/20 132/78  12/18/19 137/78  12/02/19 134/70   Pulse Readings from Last 3 Encounters:  08/21/20 71  12/18/19 69  12/02/19 (!) 57   Wt Readings from Last 3 Encounters:  08/21/20 175 lb (79.4 kg)  12/18/19 168 lb (76.2 kg)  12/02/19 170 lb (77.1 kg)   BMI Readings from Last 3 Encounters:  08/21/20 31.00 kg/m  12/18/19 29.76 kg/m  12/02/19 30.11 kg/m    Assessment/Interventions: Review of patient past medical history, allergies, medications, health status, including review of consultants reports, laboratory and other test data, was performed as part of comprehensive evaluation and provision of chronic care management services.   SDOH:  (Social Determinants of Health) assessments and interventions performed: Yes SDOH Interventions   Flowsheet Row Most Recent Value  SDOH Interventions   Financial Strain Interventions Other (Comment)  [Pt has [place] for [place] and was not aware, contacted pharmacy to refill]      CCM Care Plan  Allergies  Allergen Reactions  . Diltiazem Hcl Other (See Comments)    REACTION: pt states it made her dizzy...  . Statins     Muscle aches  . Zetia [Ezetimibe]     "it made me feel bad"  . Codeine Nausea Only  . Sulfonamide Derivatives Hives, Itching and Rash  . Tape Itching  . Tramadol Itching    Medications Reviewed Today    Reviewed by Charlton Haws, [facility] (Pharmacist) on 09/04/20 at 1051  Med List Status: <None>  Medication Order Taking? Sig Documenting Provider Last Dose Status Informant  acetaminophen (TYLENOL) 500 MG tablet [id number] Yes Take 500 mg by mouth every 6 (six) hours as needed. [provider] Taking Active   ALPRAZolam Duanne Moron) 0.5 MG tablet [id number] Yes TAKE 1 TABLET BY MOUTH THREE TIMES DAILY AS NEEDED FOR ANXIETY Janith Lima, MD Taking Active    benzonatate (TESSALON) 200 MG capsule [id number] No Take 1 capsule (200 mg total) by mouth 3 (three) times daily as needed.  Patient not taking: Reported on 09/04/2020   Hoyt Koch, MD Not Taking Active   brimonidine [facility]) 0.15 % ophthalmic solution [id number] Yes Place 1 drop into both eyes 3 (three) times daily. [provider] Taking Active Self  cetirizine (ZYRTEC) 10 MG tablet [id number] Yes Take 1 tablet (10 mg total) by mouth daily. Ronnald Ramp,  Arvid Right, MD Taking Active   cholestyramine Lucrezia Starch) 4 g packet [id number] Yes Take 1 packet (4 g total) by mouth daily. Zehr, Laban Emperor, PA-C Taking Active   dicyclomine (BENTYL) 10 MG capsule [id number] Yes Take 1 capsule (10 mg total) by mouth 4 (four) times daily -  before meals and at bedtime. Janith Lima, MD Taking Active   diphenoxylate-atropine (LOMOTIL) 2.5-0.025 MG tablet [id number] Yes Take 1 tablet by mouth 4 (four) times daily as needed for diarrhea or loose stools. Marrian Salvage, FNP Taking Active   dorzolamide (TRUSOPT) 2 % ophthalmic solution [id number] Yes Place 1 drop into both eyes 3 (three) times daily. [provider] Taking Active Self  escitalopram (LEXAPRO) 20 MG tablet [id number] Yes TAKE 1 TABLET BY MOUTH AT BEDTIME Janith Lima, MD Taking Active   furosemide (LASIX) 20 MG tablet [id number] Yes TAKE 1 TABLET BY MOUTH IN THE MORNING Janith Lima, MD Taking Active   icosapent Ethyl (VASCEPA) 1 g capsule [id number] No Take 2 g by mouth 2 (two) times daily.  Patient not taking: Reported on 09/04/2020   [provider] Not Taking Active   levothyroxine (SYNTHROID) 25 MCG tablet [id number] Yes TAKE 1 TABLET BY MOUTH ONCE DAILY BEFORE BREAKFAST Janith Lima, MD Taking Active   Lifitegrast Shirley Friar) 5 % SOLN [id number] Yes Apply 2 drops to eye 2 (two) times daily. [provider] Taking Active   potassium chloride SA (KLOR-CON) 20 MEQ tablet [id number] Yes TAKE 1  BY MOUTH IN THE  MORNING AND 1 AT BEDTIME Janith Lima, MD Taking Active           Patient Active Problem List   Diagnosis Date Noted  . Bile salt-induced diarrhea 09/03/2020  . Statin intolerance 12/03/2019  . Acquired hypothyroidism 12/02/2019  . Need for hepatitis C screening test 12/02/2019  . Loud snoring 12/02/2019  . Pure hypertriglyceridemia 02/28/2019  . Reactive airway disease 06/23/2017  . TIA (transient ischemic attack) 02/28/2017  . Depression with somatization 12/13/2016  . Allergic rhinitis due to pollen 06/21/2016  . Simple chronic bronchitis ([place]) 06/21/2016  . Routine general medical examination at a health care facility 11/29/2015  . GAD (generalized anxiety disorder) 12/02/2014  . IBS (irritable bowel syndrome) 01/20/2014  . B12 deficiency anemia 07/10/2013  . Vitamin D deficiency 01/04/2013  . HTN (hypertension) 07/26/2011  . CAD (coronary artery disease) 07/26/2011  . Nonalcoholic steatohepatitis (NASH) 06/20/2009  . OBESITY 09/08/2007  . OSTEOPENIA 09/07/2007  . Hyperlipidemia with target LDL less than 100 07/30/2007    Immunization History  Administered Date(s) Administered  . Fluad Quad(high Dose 65+) 02/27/2019  . Influenza Split 03/27/2011, 03/30/2012  . Influenza Whole 04/06/2009, 04/12/2010  . Influenza, High Dose Seasonal PF 03/09/2017  . Influenza,inj,Quad PF,6+ Mos 04/18/2013, 02/19/2014, 03/31/2015, 03/04/2016  . Influenza-Unspecified 02/18/2018, 04/09/2020  . Moderna Sars-Covid-2 Vaccination 07/03/2019, 07/31/2019, 04/10/2020  . Pneumococcal Conjugate-13 03/18/2016  . Pneumococcal Polysaccharide-23 03/21/2007  . Tdap 10/17/2011, 10/04/2017    Conditions to be addressed/monitored:  Hypertension, Hyperlipidemia, Coronary Artery Disease and IBS  Care Plan : [place]  Updates made by Charlton Haws, [place] since 09/04/2020 12:00 AM    Problem: Hypertension, Hyperlipidemia, Coronary Artery Disease and IBS   Priority: High     Long-Range Goal: Disease management   Start Date: 09/04/2020  Expected End Date: 03/07/2021  This Visit's Progress: On track  Priority: High  Note:   Current Barriers:  . Unable to independently afford treatment  regimen . Unable to independently monitor therapeutic efficacy . Suboptimal therapeutic regimen for cholesterol  Pharmacist Clinical Goal(s):  Marland Kitchen Patient will verbalize ability to afford treatment regimen . achieve adherence to monitoring guidelines and medication adherence to achieve therapeutic efficacy . adhere to plan to optimize therapeutic regimen for cholesterol as evidenced by report of adherence to recommended medication management changes through collaboration with PharmD and provider.   Interventions: . 1:1 collaboration with Janith Lima, MD regarding development and update of comprehensive plan of care as evidenced by provider attestation and co-signature . Inter-disciplinary care team collaboration (see longitudinal plan of care) . Comprehensive medication review performed; medication list updated in electronic medical record  Current Barriers:  . Chronic Disease Management support, education, and care coordination needs related to Hypertension and Hyperlipidemia   Hypertension (BP goal < 140/90) . Controlled  - BP at goal . Current regimen:  o Furosemide 20 mg daily . Interventions: o Discussed BP goals and benefits of diet/exercise for prevention of heart attack / stroke o Educated on the effect of furosemide on BP and potassium o Recommend to continue current medication  Hyperlipidemia / Coronary artery disease (LDL goal < 70) . Uncontrolled  - LDL above goal. She has hx of nonobstructive CAD, TIA so LDL goal is < 70, ideally. She has been intolerant to statins in the past. She was referred to lipid clinic in 2017 for possible PCSK9 initiation but did not keep appt. . Current regimen:  o Vascepa 1 gram - 2 capsules twice a day (not taking due to  cost) o Cholestyramine 4 g packet daily (prescribed for IBS) . Previously tried/failed meds: rosuvastatin, atorvastatin, pravastatin, simvastatin, ezetimibe  . Interventions: o Obtained [employer] for Vascepa copays through 01/16/2021. [place] to provide [employer] and refill Vascepa. o Recommend to restart discussion regarding PCSK9 initiation o Recommend to continue current medication  IBS-D . Improved - pt notes some improvement since starting Questran . Current regimen:  o Dicyclomine 10 mg QID prn o Cholestyramine 4 g packet daily o Lomotil PRN . Interventions: o Educated on benefits of Questran and dicyclomine o Recommend to continue current medication  Patient Goals/Self-Care Activities . Patient will:  - take medications as prescribed focus on medication adherence by pill box collaborate with provider on medication access solutions target a minimum of 150 minutes of moderate intensity exercise weekly engage in dietary modifications by reducing cholesterol content  Follow Up Plan: Telephone follow up appointment with care management team member scheduled for: 3 months      Medication Assistance:  -[employer] for hypercholesterolemia approved through 01/16/21 (will cover Vascepa copays)  RX BIN: [id number]  RX PCN: [id number]  RX GRP: [id number]  ID: [id number]  Patient's preferred pharmacy is:  [place], [place] - [address] [place] [address] Phone: [phone] Fax: [phone]  Uses pill box? Yes Pt endorses 90% compliance (not taking Vascepa due to cost)  We discussed: Current pharmacy is preferred with insurance plan and patient is satisfied with pharmacy services Patient decided to: Continue current medication management strategy  Care Plan and Follow Up Patient Decision:  Patient agrees to Care Plan and Follow-up.  Plan: Telephone follow up appointment with care  management team member scheduled for:  3 months  Charlene Brooke, PharmD, [facility] Clinical Pharmacist [facility] at [facility] [phone]

## 2020-09-03 NOTE — Telephone Encounter (Signed)
Great.  Thank you.  Please save to her chart.

## 2020-09-04 NOTE — Patient Instructions (Signed)
Visit Information  Phone number for Pharmacist: [phone]  Goals Addressed   None    Patient Care Plan: CCM Pharmacy Care Plan    Problem Identified: Hypertension, Hyperlipidemia, Coronary Artery Disease and IBS   Priority: High    Long-Range Goal: Disease management   Start Date: 09/04/2020  Expected End Date: 03/07/2021  This Visit's Progress: On track  Priority: High  Note:   Current Barriers:  . Unable to independently afford treatment regimen . Unable to independently monitor therapeutic efficacy . Suboptimal therapeutic regimen for cholesterol  Pharmacist Clinical Goal(s):  Marland Kitchen Patient will verbalize ability to afford treatment regimen . achieve adherence to monitoring guidelines and medication adherence to achieve therapeutic efficacy . adhere to plan to optimize therapeutic regimen for cholesterol as evidenced by report of adherence to recommended medication management changes through collaboration with PharmD and provider.   Interventions: . 1:1 collaboration with Janith Lima, MD regarding development and update of comprehensive plan of care as evidenced by provider attestation and co-signature . Inter-disciplinary care team collaboration (see longitudinal plan of care) . Comprehensive medication review performed; medication list updated in electronic medical record  Current Barriers:  . Chronic Disease Management support, education, and care coordination needs related to Hypertension and Hyperlipidemia   Hypertension (BP goal < 140/90) . Controlled  - BP at goal . Current regimen:  o Furosemide 20 mg daily . Interventions: o Discussed BP goals and benefits of diet/exercise for prevention of heart attack / stroke o Educated on the effect of furosemide on BP and potassium o Recommend to continue current medication  Hyperlipidemia / Coronary artery disease (LDL goal < 70) . Uncontrolled  - LDL above goal. She has hx of nonobstructive CAD, TIA so LDL goal is <  70, ideally. She has been intolerant to statins in the past. She was referred to lipid clinic in 2017 for possible PCSK9 initiation but did not keep appt. . Current regimen:  o Vascepa 1 gram - 2 capsules twice a day (not taking due to cost) o Cholestyramine 4 g packet daily (prescribed for IBS) . Previously tried/failed meds: rosuvastatin, atorvastatin, pravastatin, simvastatin, ezetimibe  . Interventions: o Obtained [employer] for Vascepa copays through 01/16/2021. [place] to provide [employer] and refill Vascepa. o Recommend to restart discussion regarding PCSK9 initiation o Recommend to continue current medication  IBS-D . Improved - pt notes some improvement since starting Questran . Current regimen:  o Dicyclomine 10 mg QID prn o Cholestyramine 4 g packet daily o Lomotil PRN . Interventions: o Educated on benefits of Questran and dicyclomine o Recommend to continue current medication  Patient Goals/Self-Care Activities . Patient will:  - take medications as prescribed focus on medication adherence by pill box collaborate with provider on medication access solutions target a minimum of 150 minutes of moderate intensity exercise weekly engage in dietary modifications by reducing cholesterol content  Follow Up Plan: Telephone follow up appointment with care management team member scheduled for: 3 months     Patient verbalizes understanding of instructions provided today and agrees to view in [place].  Telephone follow up appointment with pharmacy team member scheduled for: 3 months  Charlene Brooke, PharmD, [facility] Clinical Pharmacist [place] [facility] at [facility] [phone]

## 2020-09-07 ENCOUNTER — Other Ambulatory Visit: Payer: Self-pay | Admitting: Internal Medicine

## 2020-09-07 DIAGNOSIS — F411 Generalized anxiety disorder: Secondary | ICD-10-CM

## 2020-10-21 ENCOUNTER — Telehealth: Payer: Self-pay | Admitting: Internal Medicine

## 2020-10-27 NOTE — Telephone Encounter (Signed)
Patient calling, has an appointment for 06.15.22 and is wondering if she can get a short supply

## 2020-10-27 NOTE — Telephone Encounter (Signed)
Pt has been informed that Rx refill was denied per PCP.

## 2020-10-27 NOTE — Telephone Encounter (Signed)
Pt was not home when I made contact. Left msg with person who answered for pt to return cal to discuss.   Denied.   Per last OV on 12/02/19 Follow-up: Return in about 6 months (around 06/02/2020).

## 2020-10-27 NOTE — Telephone Encounter (Signed)
    Patient requesting short supply of levothyroxine (SYNTHROID) 25 MCG tablet until next appointment June 15

## 2020-10-29 ENCOUNTER — Other Ambulatory Visit: Payer: Self-pay

## 2020-10-29 ENCOUNTER — Encounter: Payer: Self-pay | Admitting: Internal Medicine

## 2020-10-29 ENCOUNTER — Ambulatory Visit (INDEPENDENT_AMBULATORY_CARE_PROVIDER_SITE_OTHER): Payer: PPO | Admitting: Internal Medicine

## 2020-10-29 VITALS — BP 132/66 | HR 62 | Temp 98.0°F | Ht 63.0 in | Wt 172.2 lb

## 2020-10-29 DIAGNOSIS — R739 Hyperglycemia, unspecified: Secondary | ICD-10-CM

## 2020-10-29 DIAGNOSIS — I10 Essential (primary) hypertension: Secondary | ICD-10-CM

## 2020-10-29 DIAGNOSIS — E039 Hypothyroidism, unspecified: Secondary | ICD-10-CM | POA: Diagnosis not present

## 2020-10-29 DIAGNOSIS — E785 Hyperlipidemia, unspecified: Secondary | ICD-10-CM

## 2020-10-29 LAB — CBC
HCT: 41.6 % (ref 36.0–46.0)
Hemoglobin: 13.8 g/dL (ref 12.0–15.0)
MCHC: 33.1 g/dL (ref 30.0–36.0)
MCV: 87.6 fl (ref 78.0–100.0)
Platelets: 127 10*3/uL — ABNORMAL LOW (ref 150.0–400.0)
RBC: 4.75 Mil/uL (ref 3.87–5.11)
RDW: 14.4 % (ref 11.5–15.5)
WBC: 4.2 10*3/uL (ref 4.0–10.5)

## 2020-10-29 LAB — LIPID PANEL
Cholesterol: 206 mg/dL — ABNORMAL HIGH (ref 0–200)
HDL: 38 mg/dL — ABNORMAL LOW (ref >39.00)
LDL Cholesterol: 141 mg/dL — ABNORMAL HIGH (ref 0–99)
NonHDL: 168.13
Total CHOL/HDL Ratio: 5
Triglycerides: 135 mg/dL (ref 0.0–149.0)
VLDL: 27 mg/dL (ref 0.0–40.0)

## 2020-10-29 LAB — COMPREHENSIVE METABOLIC PANEL
ALT: 24 U/L (ref 0–35)
AST: 26 U/L (ref 0–37)
Albumin: 4.1 g/dL (ref 3.5–5.2)
Alkaline Phosphatase: 133 U/L — ABNORMAL HIGH (ref 39–117)
BUN: 14 mg/dL (ref 6–23)
CO2: 31 mEq/L (ref 19–32)
Calcium: 9.3 mg/dL (ref 8.4–10.5)
Chloride: 106 mEq/L (ref 96–112)
Creatinine, Ser: 0.85 mg/dL (ref 0.40–1.20)
GFR: 65.02 mL/min (ref >60.00)
Glucose, Bld: 106 mg/dL — ABNORMAL HIGH (ref 70–99)
Potassium: 4 mEq/L (ref 3.5–5.1)
Sodium: 142 mEq/L (ref 135–145)
Total Bilirubin: 0.5 mg/dL (ref 0.2–1.2)
Total Protein: 6.8 g/dL (ref 6.0–8.3)

## 2020-10-29 LAB — HEMOGLOBIN A1C: Hgb A1c MFr Bld: 5.6 % (ref 4.6–6.5)

## 2020-10-29 LAB — TSH: TSH: 3.49 u[IU]/mL (ref 0.35–4.50)

## 2020-10-29 LAB — T4, FREE: Free T4: 0.73 ng/dL (ref 0.60–1.60)

## 2020-10-29 MED ORDER — LEVOTHYROXINE SODIUM 25 MCG PO TABS: ORAL_TABLET | ORAL | 0 refills | Status: DC

## 2020-10-29 NOTE — Progress Notes (Signed)
   Subjective:   Patient ID: [name], female    DOB: 07/04/1940, 80 y.o.   MRN: [id number]  HPI The patient is a 80 YO female coming in for because she was unable to get refills without visit and could not get in with PCP until next month. Denies concerns or health changes. Would like to get all labs so she can discuss with PCP at upcoming visit.   Review of Systems  Constitutional: Negative.   HENT: Negative.   Eyes: Negative.   Respiratory: Negative for cough, chest tightness and shortness of breath.   Cardiovascular: Negative for chest pain, palpitations and leg swelling.  Gastrointestinal: Negative for abdominal distention, abdominal pain, constipation, diarrhea, nausea and vomiting.  Musculoskeletal: Negative.   Skin: Negative.   Neurological: Negative.   Psychiatric/Behavioral: Negative.     Objective:  Physical Exam Constitutional:      Appearance: She is well-developed.  HENT:     Head: Normocephalic and atraumatic.  Cardiovascular:     Rate and Rhythm: Normal rate and regular rhythm.  Pulmonary:     Effort: Pulmonary effort is normal. No respiratory distress.     Breath sounds: Normal breath sounds. No wheezing or rales.  Abdominal:     General: Bowel sounds are normal. There is no distension.     Palpations: Abdomen is soft.     Tenderness: There is no abdominal tenderness. There is no rebound.  Musculoskeletal:     Cervical back: Normal range of motion.  Skin:    General: Skin is warm and dry.  Neurological:     Mental Status: She is alert and oriented to person, place, and time.     Coordination: Coordination normal.     Vitals:   10/29/20 0855  BP: 132/66  Pulse: 62  Temp: 98 F (36.7 C)  TempSrc: Oral  SpO2: 97%  Weight: 172 lb 3.2 oz (78.1 kg)  Height: 5\' 3"  (1.6 m)    This visit occurred during the SARS-CoV-2 public health emergency.  Safety protocols were in place, including screening questions prior to the visit, additional usage of  staff PPE, and extensive cleaning of exam room while observing appropriate contact time as indicated for disinfecting solutions.   Assessment & Plan:

## 2020-10-29 NOTE — Patient Instructions (Addendum)
You can try a probiotic to see if this helps you to delay going to the bathroom.   We will check the labs today and have sent in the thyroid medicine.

## 2020-10-30 NOTE — Assessment & Plan Note (Signed)
Checking TSH and free T4 and adjust synthroid dosing if necessary. Refilled for 90 days until visit with PCP.

## 2020-10-30 NOTE — Assessment & Plan Note (Signed)
Checking lipid panel and adjust vascepa as needed. She is not taking as prescribed. She is taking 2 pills daily as she feels dizzy if she takes 4 pills in a day.

## 2020-10-30 NOTE — Assessment & Plan Note (Signed)
BP at goal and checking CMP. Taking lasix and potassium. Adjust as needed.

## 2020-11-05 ENCOUNTER — Telehealth: Payer: Self-pay | Admitting: Neurology

## 2020-11-05 NOTE — Telephone Encounter (Signed)
Called pt to schedule f/u for initial CPAP and she stated she would call us back to schedule. Informed pt we needed to see her within 30-90 days and she stated she would call us back before then. When she returns call, she can have an OV with either Amy, Megan, or Dr. Rexene Alberts.

## 2020-11-06 ENCOUNTER — Telehealth: Payer: Self-pay | Admitting: Pharmacist

## 2020-11-06 NOTE — Progress Notes (Addendum)
Chronic Care Management Pharmacy Assistant   Name: [name]  MRN: [id number] DOB: Nov 09, 1940   Reason for Encounter: Disease State Hyperlipidemia Call   Conditions to be addressed/monitored: HLD   Recent office visits:  10/29/20 Dr. Sharlet Salina  Recent consult visits:  None ID  Hospital visits:  None in previous 6 months  Medications: Outpatient Encounter Medications as of 11/06/2020  Medication Sig Note   acetaminophen (TYLENOL) 500 MG tablet Take 500 mg by mouth every 6 (six) hours as needed.    ALPRAZolam (XANAX) 0.5 MG tablet TAKE 1 TABLET BY MOUTH THREE TIMES DAILY AS NEEDED FOR ANXIETY    benzonatate (TESSALON) 200 MG capsule Take 1 capsule (200 mg total) by mouth 3 (three) times daily as needed.    brimonidine (ALPHAGAN) 0.15 % ophthalmic solution Place 1 drop into both eyes 3 (three) times daily.    cetirizine (ZYRTEC) 10 MG tablet Take 1 tablet (10 mg total) by mouth daily.    cholestyramine (QUESTRAN) 4 g packet Take 1 packet (4 g total) by mouth daily.    dicyclomine (BENTYL) 10 MG capsule Take 1 capsule (10 mg total) by mouth 4 (four) times daily -  before meals and at bedtime.    diphenoxylate-atropine (LOMOTIL) 2.5-0.025 MG tablet Take 1 tablet by mouth 4 (four) times daily as needed for diarrhea or loose stools.    dorzolamide (TRUSOPT) 2 % ophthalmic solution Place 1 drop into both eyes 3 (three) times daily.    escitalopram (LEXAPRO) 20 MG tablet TAKE 1 TABLET BY MOUTH AT BEDTIME    furosemide (LASIX) 20 MG tablet TAKE 1 TABLET BY MOUTH IN THE MORNING    icosapent Ethyl (VASCEPA) 1 g capsule Take 2 g by mouth 2 (two) times daily. 09/04/2020: $0 copay with [name] (exp 12/2020)   levothyroxine (SYNTHROID) 25 MCG tablet TAKE 1 TABLET BY MOUTH ONCE DAILY BEFORE BREAKFAST    Lifitegrast (XIIDRA) 5 % SOLN Apply 2 drops to eye 2 (two) times daily.    potassium chloride SA (KLOR-CON) 20 MEQ tablet TAKE 1  BY MOUTH IN THE MORNING AND 1 AT BEDTIME    No  facility-administered encounter medications on file as of 11/06/2020.    Comprehensive medication review performed; Spoke to patient regarding cholesterol  Lipid Panel    Component Value Date/Time   CHOL 206 (H) 10/29/2020 0916   TRIG 135.0 10/29/2020 0916   HDL 38.00 (L) 10/29/2020 0916   LDLCALC 141 (H) 10/29/2020 0916   LDLDIRECT 174.0 02/27/2019 1639    10-year ASCVD risk score: The 10-year ASCVD risk score Mikey Bussing DC Jr., et al., 2013) is: 30.9%   Values used to calculate the score:     Age: 80 years     Sex: Female     Is Non-Hispanic African American: No     Diabetic: No     Tobacco smoker: No     Systolic Blood Pressure: 539 mmHg     Is BP treated: Yes     HDL Cholesterol: 38 mg/dL     Total Cholesterol: 206 mg/dL  Current antihyperlipidemic regimen:  Vascepa 1 g 2 caps twice daily  Previous antihyperlipidemic medications tried: rosuvastatin, atorvastatin, pravastatin, ezetimibe  ASCVD risk enhancing conditions: age >79   What recent interventions/DTPs have been made by any provider to improve Cholesterol control since last CPP Visit: Continue with current medications  Any recent hospitalizations or ED visits since last visit with CPP? No   What diet changes have been  made to improve Cholesterol?  Patient states that she has not made any changes in diet  What exercise is being done to improve Cholesterol?  Patient states no changes in exercise  Adherence Review: Does the patient have >5 day gap between last estimated fill dates? No  Star Rating Drugs: None ID  Ethelene Hal Clinical Pharmacist Assistant [phone]  Time spent:21

## 2020-11-13 DIAGNOSIS — H401131 Primary open-angle glaucoma, bilateral, mild stage: Secondary | ICD-10-CM | POA: Diagnosis not present

## 2020-11-13 DIAGNOSIS — H524 Presbyopia: Secondary | ICD-10-CM | POA: Diagnosis not present

## 2020-11-13 DIAGNOSIS — H2513 Age-related nuclear cataract, bilateral: Secondary | ICD-10-CM | POA: Diagnosis not present

## 2020-11-19 DIAGNOSIS — G4733 Obstructive sleep apnea (adult) (pediatric): Secondary | ICD-10-CM | POA: Diagnosis not present

## 2020-11-25 ENCOUNTER — Other Ambulatory Visit: Payer: Self-pay | Admitting: Internal Medicine

## 2020-11-25 ENCOUNTER — Telehealth: Payer: Self-pay | Admitting: Internal Medicine

## 2020-11-25 DIAGNOSIS — I10 Essential (primary) hypertension: Secondary | ICD-10-CM

## 2020-11-25 MED ORDER — POTASSIUM CHLORIDE CRYS ER 20 MEQ PO TBCR: 20.0000 meq | EXTENDED_RELEASE_TABLET | Freq: Two times a day (BID) | ORAL | 0 refills | Status: DC

## 2020-11-25 NOTE — Telephone Encounter (Signed)
1.Medication Requested: potassium chloride SA (KLOR-CON) 20 MEQ tablet    2. Pharmacy (Name, [place], [facility]): [place], [place]  3. On Med List: yes   4. Last Visit with PCP: 12-02-19  5. Next visit date with PCP: n/a    Agent: Please be advised that RX refills may take up to 3 business days. We ask that you follow-up with your pharmacy.

## 2020-11-28 ENCOUNTER — Telehealth: Payer: Self-pay | Admitting: Internal Medicine

## 2020-11-28 DIAGNOSIS — F411 Generalized anxiety disorder: Secondary | ICD-10-CM

## 2020-11-30 NOTE — Telephone Encounter (Signed)
1.Medication Requested: ALPRAZolam (XANAX) 0.5 MG tablet   2. Pharmacy (Name, [place], [facility]): [place], [place] - [place]  3. On Med List: yes   4. Last Visit with PCP: 10-29-20/ [facility]   5. Next visit date with PCP: n/a   Patients daughter is requesting a call back in regards to when the patient needs to see PCP. She can be reached at [phone]   Agent: Please be advised that RX refills may take up to 3 business days. We ask that you follow-up with your pharmacy.

## 2020-12-01 NOTE — Telephone Encounter (Signed)
    Patients daughter [name] calling for status of refill. She states her mother is having a very hard time not having the Alprazolam. She is willing to schedule an appointment however the next available is in July. Patient saw Dr Sharlet Salina 10/29/20  Requesting call from CMA/Dr Ronnald Ramp

## 2020-12-01 NOTE — Telephone Encounter (Signed)
Pt has been scheduled for 6/15 @ 9.20. [name] did not agree with pt not getting Rx even though she was seen last month by Dr. Sharlet Salina. I explained to her that her last OV with PCP was 11/2019 and at that time it was recommended that she returned in 19mo. Since she is overdue a Rx could not be granted since she was overdue. [name] then inquired about switching PCPs.

## 2020-12-02 ENCOUNTER — Telehealth: Payer: PPO

## 2020-12-02 ENCOUNTER — Ambulatory Visit (INDEPENDENT_AMBULATORY_CARE_PROVIDER_SITE_OTHER): Payer: PPO | Admitting: Internal Medicine

## 2020-12-02 ENCOUNTER — Telehealth: Payer: Self-pay | Admitting: Pharmacist

## 2020-12-02 ENCOUNTER — Other Ambulatory Visit: Payer: Self-pay

## 2020-12-02 ENCOUNTER — Ambulatory Visit: Payer: PPO | Admitting: Internal Medicine

## 2020-12-02 ENCOUNTER — Encounter: Payer: Self-pay | Admitting: Internal Medicine

## 2020-12-02 VITALS — BP 136/84 | HR 58 | Temp 98.0°F | Ht 63.0 in | Wt 172.0 lb

## 2020-12-02 DIAGNOSIS — D518 Other vitamin B12 deficiency anemias: Secondary | ICD-10-CM

## 2020-12-02 DIAGNOSIS — F411 Generalized anxiety disorder: Secondary | ICD-10-CM | POA: Diagnosis not present

## 2020-12-02 DIAGNOSIS — D696 Thrombocytopenia, unspecified: Secondary | ICD-10-CM

## 2020-12-02 DIAGNOSIS — K582 Mixed irritable bowel syndrome: Secondary | ICD-10-CM

## 2020-12-02 LAB — CBC WITH DIFFERENTIAL/PLATELET
Basophils Absolute: 0 10*3/uL (ref 0.0–0.1)
Basophils Relative: 0.6 % (ref 0.0–3.0)
Eosinophils Absolute: 0 10*3/uL (ref 0.0–0.7)
Eosinophils Relative: 0.9 % (ref 0.0–5.0)
HCT: 41.6 % (ref 36.0–46.0)
Hemoglobin: 13.9 g/dL (ref 12.0–15.0)
Lymphocytes Relative: 18.6 % (ref 12.0–46.0)
Lymphs Abs: 0.8 10*3/uL (ref 0.7–4.0)
MCHC: 33.4 g/dL (ref 30.0–36.0)
MCV: 87.6 fl (ref 78.0–100.0)
Monocytes Absolute: 0.3 10*3/uL (ref 0.1–1.0)
Monocytes Relative: 6.4 % (ref 3.0–12.0)
Neutro Abs: 3.2 10*3/uL (ref 1.4–7.7)
Neutrophils Relative %: 73.5 % (ref 43.0–77.0)
Platelets: 129 10*3/uL — ABNORMAL LOW (ref 150.0–400.0)
RBC: 4.75 Mil/uL (ref 3.87–5.11)
RDW: 14.1 % (ref 11.5–15.5)
WBC: 4.3 10*3/uL (ref 4.0–10.5)

## 2020-12-02 LAB — FOLATE: Folate: >24.4 ng/mL (ref >5.9)

## 2020-12-02 LAB — VITAMIN B12: Vitamin B-12: >1550 pg/mL — ABNORMAL HIGH (ref 211–911)

## 2020-12-02 MED ORDER — DICYCLOMINE HCL 10 MG PO CAPS: 10.0000 mg | ORAL_CAPSULE | Freq: Three times a day (TID) | ORAL | 1 refills | Status: DC

## 2020-12-02 MED ORDER — ESCITALOPRAM OXALATE 20 MG PO TABS
1.0000 | ORAL_TABLET | Freq: Every day | ORAL | 1 refills | Status: DC
Start: 2020-12-02 — End: 2021-06-02

## 2020-12-02 MED ORDER — ALPRAZOLAM 0.5 MG PO TABS: 0.5000 mg | ORAL_TABLET | Freq: Three times a day (TID) | ORAL | 1 refills | Status: DC | PRN

## 2020-12-02 NOTE — Telephone Encounter (Signed)
Contacted patient for her scheduled CCM telephone follow up visit. I reached someone who informed me [name] has left the house to come to [facility] for a 9:20 appt.   Per chart she did have a 9:20 appt today with Dr Ronnald Ramp previously, but it was cancelled. Her next scheduled appt with Dr Ronnald Ramp is 7/13. Patient is already on the way to the office so she will talk with front desk when she gets here.

## 2020-12-02 NOTE — Patient Instructions (Signed)

## 2020-12-02 NOTE — Progress Notes (Deleted)
Chronic Care Management Pharmacy Note  12/02/2020 Name:  [name] MRN:  [id number] DOB:  [birth year]  Summary:   Recommendations/Changes made from today's visit:   Plan:   Subjective: [name] is an 80 y.o. year old female who is a primary patient of Janith Lima, MD.  The CCM team was consulted for assistance with disease management and care coordination needs.    Engaged with patient by telephone for follow up visit in response to provider referral for pharmacy case management and/or care coordination services.   Consent to Services:  The patient was given information about Chronic Care Management services, agreed to services, and gave verbal consent prior to initiation of services.  Please see initial visit note for detailed documentation.   Patient Care Team: Janith Lima, MD as PCP - General (Internal Medicine) Charlton Haws, [facility] as Pharmacist (Pharmacist)  Recent office visits: 10/29/20 Dr Sharlet Salina OV: unable to get in w/ PCP for med refills. Refilled levothyroxine. Labs ordered. LDL improved somewhat.  07/20/20 NP Jodi Mourning VV: c/o diarrhea, rx'd metronidazole. Rx'd Lomotil #20 and GI referral.  12/02/19 Dr Ronnald Ramp OV: CPE. LDL too high, pt has declined statin.   Recent consult visits: 08/21/20 PA Janett Billow Zehr (GI): eval for IBS-D. Started cholestyramine 4 gm packet. Updated Vascepa Rx to 2 g BID. DC'd Vitamin D, metronidazole, Vit B12 nasal spray. Rec'd Probiotic daily.  07/13/20 Dr Milford Cage (urology): f/u cyst of kidney  03/13/20 Dr Lucia Gaskins (ENT): f/u ear pain. Rec'd Flonase.  Hospital visits: None in previous 6 months  Objective:  Lab Results  Component Value Date   CREATININE 0.85 10/29/2020   BUN 14 10/29/2020   GFR 65.02 10/29/2020   GFRNONAA >60 07/06/2017   GFRAA >60 07/06/2017   NA 142 10/29/2020   K 4.0 10/29/2020   CALCIUM 9.3 10/29/2020   CO2 31 10/29/2020   GLUCOSE 106 (H) 10/29/2020    Lab Results  Component Value  Date/Time   HGBA1C 5.6 10/29/2020 09:16 AM   HGBA1C 4.9 03/01/2017 06:24 AM   GFR 65.02 10/29/2020 09:16 AM   GFR 67.04 07/20/2020 04:14 PM    Last diabetic Eye exam: No results found for: HMDIABEYEEXA  Last diabetic Foot exam: No results found for: HMDIABFOOTEX   Lab Results  Component Value Date   CHOL 206 (H) 10/29/2020   HDL 38.00 (L) 10/29/2020   LDLCALC 141 (H) 10/29/2020   LDLDIRECT 174.0 02/27/2019   TRIG 135.0 10/29/2020   CHOLHDL 5 10/29/2020    Hepatic Function Latest Ref Rng & Units 10/29/2020 07/20/2020 12/02/2019  Total Protein 6.0 - 8.3 g/dL 6.8 7.1 7.2  Albumin 3.5 - 5.2 g/dL 4.1 4.5 4.5  AST 0 - 37 U/L _0 ALT 0 - 35 U/L _1 Alk Phosphatase 39 - 117 U/L 133(H) 120(H) 112  Total Bilirubin 0.2 - 1.2 mg/dL 0.5 0.5 0.6  Bilirubin, Direct 0.0 - 0.3 mg/dL - - 0.1    Lab Results  Component Value Date/Time   TSH 3.49 10/29/2020 09:16 AM   TSH 2.83 12/02/2019 09:49 AM   FREET4 0.73 10/29/2020 09:16 AM    CBC Latest Ref Rng & Units 10/29/2020 07/20/2020 12/02/2019  WBC 4.0 - 10.5 K/uL 4.2 5.5 4.4  Hemoglobin 12.0 - 15.0 g/dL 13.8 14.3 14.2  Hematocrit 36.0 - 46.0 % 41.6 42.1 42.7  Platelets 150.0 - 400.0 K/uL 127.0(L) 144.0(L) 116.0(L)    Lab Results  Component Value Date/Time   VD25OH  24.51 (L) 02/27/2019 04:39 PM   VD25OH 41.68 01/13/2014 11:15 AM    Clinical ASCVD: No  The 10-year ASCVD risk score Mikey Bussing DC Jr., et al., 2013) is: 30.9%   Values used to calculate the score:     Age: 81 years     Sex: Female     Is Non-Hispanic African American: No     Diabetic: No     Tobacco smoker: No     Systolic Blood Pressure: 824 mmHg     Is BP treated: Yes     HDL Cholesterol: 38 mg/dL     Total Cholesterol: 206 mg/dL    Depression screen [facility] 2/9 10/29/2020 12/02/2019 12/02/2019  Decreased Interest 0 0 0  Down, Depressed, Hopeless 0 0 0  PHQ - 2 Score 0 0 0  Altered sleeping - - 0  Tired, decreased energy - - 0  Change in appetite - - 1  Feeling  bad or failure about yourself  - - 0  Trouble concentrating - - 1  Moving slowly or fidgety/restless - - 0  Suicidal thoughts - - 0  PHQ-9 Score - - 2  Difficult doing work/chores - - Not difficult at all  Some recent data might be hidden     Social History   Tobacco Use  Smoking Status Never  Smokeless Tobacco Never   BP Readings from Last 3 Encounters:  10/29/20 132/66  08/21/20 132/78  12/18/19 137/78   Pulse Readings from Last 3 Encounters:  10/29/20 62  08/21/20 71  12/18/19 69   Wt Readings from Last 3 Encounters:  10/29/20 172 lb 3.2 oz (78.1 kg)  08/21/20 175 lb (79.4 kg)  12/18/19 168 lb (76.2 kg)   BMI Readings from Last 3 Encounters:  10/29/20 30.50 kg/m  08/21/20 31.00 kg/m  12/18/19 29.76 kg/m    Assessment/Interventions: Review of patient past medical history, allergies, medications, health status, including review of consultants reports, laboratory and other test data, was performed as part of comprehensive evaluation and provision of chronic care management services.   SDOH:  (Social Determinants of Health) assessments and interventions performed: Yes    CCM Care Plan  Allergies  Allergen Reactions   Diltiazem Hcl Other (See Comments)    REACTION: pt states it made her dizzy...   Statins     Muscle aches   Zetia [Ezetimibe]     "it made me feel bad"   Codeine Nausea Only   Sulfonamide Derivatives Hives, Itching and Rash   Tape Itching   Tramadol Itching    Medications Reviewed Today     Reviewed by Hoyt Koch, MD (Physician) on 10/29/20 at [place] List Status: <None>   Medication Order Taking? Sig Documenting Provider Last Dose Status Informant  acetaminophen (TYLENOL) 500 MG tablet [id number] Yes Take 500 mg by mouth every 6 (six) hours as needed. [provider] Taking Active   ALPRAZolam Duanne Moron) 0.5 MG tablet [id number] Yes TAKE 1 TABLET BY MOUTH THREE TIMES DAILY AS NEEDED FOR ANXIETY Janith Lima, MD Taking  Active   benzonatate (TESSALON) 200 MG capsule [id number] Yes Take 1 capsule (200 mg total) by mouth 3 (three) times daily as needed. Hoyt Koch, MD Taking Active   brimonidine [facility]) 0.15 % ophthalmic solution [id number] Yes Place 1 drop into both eyes 3 (three) times daily. [provider] Taking Active Self  cetirizine (ZYRTEC) 10 MG tablet [id number] Yes Take 1 tablet (10 mg total) by mouth daily. Scarlette Calico  L, MD Taking Active   cholestyramine (QUESTRAN) 4 g packet [id number] Yes Take 1 packet (4 g total) by mouth daily. Zehr, Laban Emperor, PA-C Taking Active   dicyclomine (BENTYL) 10 MG capsule [id number] Yes Take 1 capsule (10 mg total) by mouth 4 (four) times daily -  before meals and at bedtime. Janith Lima, MD Taking Active   diphenoxylate-atropine (LOMOTIL) 2.5-0.025 MG tablet [id number] Yes Take 1 tablet by mouth 4 (four) times daily as needed for diarrhea or loose stools. Marrian Salvage, FNP Taking Active   dorzolamide (TRUSOPT) 2 % ophthalmic solution [id number] Yes Place 1 drop into both eyes 3 (three) times daily. [provider] Taking Active Self  escitalopram (LEXAPRO) 20 MG tablet [id number] Yes TAKE 1 TABLET BY MOUTH AT BEDTIME Janith Lima, MD Taking Active   furosemide (LASIX) 20 MG tablet [id number] Yes TAKE 1 TABLET BY MOUTH IN THE MORNING Janith Lima, MD Taking Active   icosapent Ethyl (VASCEPA) 1 g capsule [id number] Yes Take 2 g by mouth 2 (two) times daily. [provider] Taking Active            Med Note Charlton Haws   Fri Sep 04, 2020 11:04 AM) $0 copay with [name] (exp 12/2020)  levothyroxine (SYNTHROID) 25 MCG tablet [id number]  TAKE 1 TABLET BY MOUTH ONCE DAILY BEFORE BREAKFAST Hoyt Koch, MD  Active   Lifitegrast Shirley Friar) 5 % SOLN [id number] Yes Apply 2 drops to eye 2 (two) times daily. [provider] Taking Active   potassium chloride SA (KLOR-CON) 20 MEQ tablet [id number] Yes  TAKE 1  BY MOUTH IN THE MORNING AND 1 AT BEDTIME Janith Lima, MD Taking Active             Patient Active Problem List   Diagnosis Date Noted   Bile salt-induced diarrhea 09/03/2020   Statin intolerance 12/03/2019   Acquired hypothyroidism 12/02/2019   Need for hepatitis C screening test 12/02/2019   Loud snoring 12/02/2019   Pure hypertriglyceridemia 02/28/2019   Reactive airway disease 06/23/2017   TIA (transient ischemic attack) 02/28/2017   Depression with somatization 12/13/2016   Allergic rhinitis due to pollen 06/21/2016   Simple chronic bronchitis ([place]) 06/21/2016   Routine general medical examination at a health care facility 11/29/2015   GAD (generalized anxiety disorder) 12/02/2014   IBS (irritable bowel syndrome) 01/20/2014   B12 deficiency anemia 07/10/2013   Vitamin D deficiency 01/04/2013   HTN (hypertension) 07/26/2011   CAD (coronary artery disease) 70/62/3762   Nonalcoholic steatohepatitis ([place]) 06/20/2009   OBESITY 09/08/2007   OSTEOPENIA 09/07/2007   Hyperlipidemia with target LDL less than 100 07/30/2007    Immunization History  Administered Date(s) Administered   Fluad Quad(high Dose 65+) 02/27/2019   Influenza Split 03/27/2011, 03/30/2012   Influenza Whole 04/06/2009, 04/12/2010   Influenza, High Dose Seasonal PF 03/09/2017   Influenza,inj,Quad PF,6+ Mos 04/18/2013, 02/19/2014, 03/31/2015, 03/04/2016   Influenza-Unspecified 02/18/2018, 04/09/2020   Moderna Sars-Covid-2 Vaccination 07/03/2019, 07/31/2019, 04/10/2020   Pneumococcal Conjugate-13 03/18/2016   Pneumococcal Polysaccharide-23 03/21/2007   Tdap 10/17/2011, 10/04/2017    Conditions to be addressed/monitored:  Hypertension, Hyperlipidemia, Coronary Artery Disease and IBS  Patient Care Plan: CCM Pharmacy Care Plan     Problem Identified: Hypertension, Hyperlipidemia, Coronary Artery Disease and IBS   Priority: High     Long-Range Goal: Disease management   Start Date:  09/04/2020  Expected End Date: 03/07/2021  This Visit's Progress: On track  Priority: High  Note:   Current Barriers:  Unable to independently afford treatment regimen Unable to independently monitor therapeutic efficacy Suboptimal therapeutic regimen for cholesterol  Pharmacist Clinical Goal(s):  Patient will verbalize ability to afford treatment regimen achieve adherence to monitoring guidelines and medication adherence to achieve therapeutic efficacy adhere to plan to optimize therapeutic regimen for cholesterol as evidenced by report of adherence to recommended medication management changes through collaboration with PharmD and provider.   Interventions: 1:1 collaboration with Janith Lima, MD regarding development and update of comprehensive plan of care as evidenced by provider attestation and co-signature Inter-disciplinary care team collaboration (see longitudinal plan of care) Comprehensive medication review performed; medication list updated in electronic medical record  Current Barriers:  Chronic Disease Management support, education, and care coordination needs related to Hypertension and Hyperlipidemia   Hypertension (BP goal < 140/90) Controlled  - BP at goal Current regimen:  Furosemide 20 mg daily Interventions: Discussed BP goals and benefits of diet/exercise for prevention of heart attack / stroke Educated on the effect of furosemide on BP and potassium Recommend to continue current medication  Hyperlipidemia / Coronary artery disease (LDL goal < 70) Uncontrolled  - LDL above goal. She has hx of nonobstructive CAD, TIA so LDL goal is < 70, ideally. She has been intolerant to statins in the past. She was referred to lipid clinic in 2017 for possible PCSK9 initiation but did not keep appt. Current regimen:  Vascepa 1 gram - 2 capsules twice a day (not taking due to cost) Cholestyramine 4 g packet daily (prescribed for IBS) Previously tried/failed meds:  rosuvastatin, atorvastatin, pravastatin, simvastatin, ezetimibe  Interventions: Obtained [employer] for Vascepa copays through 01/16/2021. [place] to provide [employer] and refill Vascepa. Recommend to restart discussion regarding PCSK9 initiation Recommend to continue current medication  IBS-D Improved - pt notes some improvement since starting Questran Current regimen:  Dicyclomine 10 mg QID prn Cholestyramine 4 g packet daily Lomotil PRN Interventions: Educated on benefits of Questran and dicyclomine Recommend to continue current medication  Patient Goals/Self-Care Activities Patient will:  - take medications as prescribed focus on medication adherence by pill box collaborate with provider on medication access solutions target a minimum of 150 minutes of moderate intensity exercise weekly engage in dietary modifications by reducing cholesterol content  Follow Up Plan: Telephone follow up appointment with care management team member scheduled for: 3 months      Medication Assistance:  -[employer] for hypercholesterolemia approved through 01/16/21 (will cover Vascepa copays)  RX BIN: [place]  RX PCN: [id number]  RX GRP: [id number]  ID: [id number]  Compliance/Adherence/Medication fill history: Care Gaps: Shingrix Covid booster (due 08/11/20)  Star-Rating Drugs: None  Patient's preferred pharmacy is:  [place], [place] - [address] [place] [address] Phone: [phone] Fax: [phone]  Uses pill box? Yes Pt endorses 90% compliance (not taking Vascepa due to cost)  We discussed: Current pharmacy is preferred with insurance plan and patient is satisfied with pharmacy services Patient decided to: Continue current medication management strategy  Care Plan and Follow Up Patient Decision:  Patient agrees to Care Plan and Follow-up.  Plan: Telephone follow up appointment with care  management team member scheduled for:  3 months  Charlene Brooke, PharmD, [place], CPP Clinical Pharmacist [facility] [phone]

## 2020-12-02 NOTE — Progress Notes (Signed)
Subjective:  Patient ID: [name], female    DOB: 15-Aug-1940  Age: 80 y.o. MRN: [id number]  CC: Hypothyroidism  This visit occurred during the SARS-CoV-2 public health emergency.  Safety protocols were in place, including screening questions prior to the visit, additional usage of staff PPE, and extensive cleaning of exam room while observing appropriate contact time as indicated for disinfecting solutions.    HPI [name] presents for f/up -   She continues to complain of anxiety and requests a refill on Xanax.  She is active and denies any recent episodes of chest pain, shortness of breath, dizziness, lightheadedness, edema, or fatigue.  Outpatient Medications Prior to Visit  Medication Sig Dispense Refill   acetaminophen (TYLENOL) 500 MG tablet Take 500 mg by mouth every 6 (six) hours as needed.     brimonidine (ALPHAGAN) 0.15 % ophthalmic solution Place 1 drop into both eyes 3 (three) times daily.  3   cetirizine (ZYRTEC) 10 MG tablet Take 1 tablet (10 mg total) by mouth daily. 90 tablet 3   cholestyramine (QUESTRAN) 4 g packet Take 1 packet (4 g total) by mouth daily. 30 packet 5   dorzolamide (TRUSOPT) 2 % ophthalmic solution Place 1 drop into both eyes 3 (three) times daily.  1   furosemide (LASIX) 20 MG tablet TAKE 1 TABLET BY MOUTH IN THE MORNING 90 tablet 0   icosapent Ethyl (VASCEPA) 1 g capsule Take 2 g by mouth 2 (two) times daily.     levothyroxine (SYNTHROID) 25 MCG tablet TAKE 1 TABLET BY MOUTH ONCE DAILY BEFORE BREAKFAST 90 tablet 0   Lifitegrast (XIIDRA) 5 % SOLN Apply 2 drops to eye 2 (two) times daily.     potassium chloride SA (KLOR-CON) 20 MEQ tablet Take 1 tablet (20 mEq total) by mouth 2 (two) times daily. 180 tablet 0   ALPRAZolam (XANAX) 0.5 MG tablet TAKE 1 TABLET BY MOUTH THREE TIMES DAILY AS NEEDED FOR ANXIETY 195 tablet 0   dicyclomine (BENTYL) 10 MG capsule Take 1 capsule (10 mg total) by mouth 4 (four) times daily -  before meals and at bedtime.  360 capsule 1   diphenoxylate-atropine (LOMOTIL) 2.5-0.025 MG tablet Take 1 tablet by mouth 4 (four) times daily as needed for diarrhea or loose stools. 20 tablet 0   escitalopram (LEXAPRO) 20 MG tablet TAKE 1 TABLET BY MOUTH AT BEDTIME 90 tablet 1   No facility-administered medications prior to visit.    ROS Review of Systems  Constitutional:  Negative for appetite change, diaphoresis and fatigue.  HENT: Negative.    Eyes: Negative.   Respiratory:  Negative for cough, chest tightness, shortness of breath and wheezing.   Cardiovascular:  Negative for chest pain, palpitations and leg swelling.  Gastrointestinal:  Negative for abdominal pain, constipation and diarrhea.  Endocrine: Negative.   Genitourinary: Negative.  Negative for difficulty urinating.  Musculoskeletal:  Negative for arthralgias and myalgias.  Skin: Negative.  Negative for color change.  Neurological: Negative.  Negative for dizziness, weakness and light-headedness.  Hematological:  Negative for adenopathy. Does not bruise/bleed easily.  Psychiatric/Behavioral:  Positive for dysphoric mood and sleep disturbance. Negative for suicidal ideas. The patient is nervous/anxious.    Objective:  BP 136/84 (BP Location: Left Arm, Patient Position: Sitting, Cuff Size: Large)   Pulse (!) 58   Temp 98 F (36.7 C) (Oral)   Ht 5\' 3"  (1.6 m)   Wt 172 lb (78 kg)   SpO2 97%   BMI  30.47 kg/m   BP Readings from Last 3 Encounters:  12/02/20 136/84  10/29/20 132/66  08/21/20 132/78    Wt Readings from Last 3 Encounters:  12/02/20 172 lb (78 kg)  10/29/20 172 lb 3.2 oz (78.1 kg)  08/21/20 175 lb (79.4 kg)    Physical Exam Vitals reviewed.  Constitutional:      Appearance: Normal appearance.  HENT:     Nose: Nose normal.     Mouth/Throat:     Mouth: Mucous membranes are moist.  Eyes:     General: No scleral icterus.    Conjunctiva/sclera: Conjunctivae normal.  Cardiovascular:     Rate and Rhythm: Normal rate and  regular rhythm.     Heart sounds: No murmur heard. Pulmonary:     Effort: Pulmonary effort is normal.     Breath sounds: No stridor. No wheezing, rhonchi or rales.  Abdominal:     General: Abdomen is flat.     Palpations: There is no mass.     Tenderness: There is no abdominal tenderness. There is no guarding.  Musculoskeletal:        General: Normal range of motion.     Cervical back: Neck supple.     Right lower leg: No edema.     Left lower leg: No edema.  Lymphadenopathy:     Cervical: No cervical adenopathy.  Skin:    General: Skin is warm and dry.  Neurological:     General: No focal deficit present.     Mental Status: She is alert.  Psychiatric:        Mood and Affect: Mood normal.        Behavior: Behavior normal.    Lab Results  Component Value Date   WBC 4.3 12/02/2020   HGB 13.9 12/02/2020   HCT 41.6 12/02/2020   PLT 129.0 (L) 12/02/2020   GLUCOSE 106 (H) 10/29/2020   CHOL 206 (H) 10/29/2020   TRIG 135.0 10/29/2020   HDL 38.00 (L) 10/29/2020   LDLDIRECT 174.0 02/27/2019   LDLCALC 141 (H) 10/29/2020   ALT 24 10/29/2020   AST 26 10/29/2020   NA 142 10/29/2020   K 4.0 10/29/2020   CL 106 10/29/2020   CREATININE 0.85 10/29/2020   BUN 14 10/29/2020   CO2 31 10/29/2020   TSH 3.49 10/29/2020   INR 0.98 02/28/2017   HGBA1C 5.6 10/29/2020    US RENAL  Result Date: 06/30/2020 CLINICAL DATA:  Renal cysts EXAM: RENAL / URINARY TRACT ULTRASOUND COMPLETE COMPARISON:  CT dated Oct 28, 2019.  MRI dated November 26, 2019. FINDINGS: Right Kidney: Renal measurements: 10.5 x 5.8 x 4.4 cm = volume: 141 mL. There is a small cystic structure arising from the lower pole measuring approximately 6 mm. There is no right-sided hydronephrosis. There is an extrarenal pelvis. Left Kidney: Renal measurements: 9.7 x 4.1 x 4.3 cm = volume: 92 mL. There is an echogenic mass arising from the lower pole measuring 1.5 x 1.6 x 1.2 cm. Bladder: Appears normal for degree of bladder distention.  Other: None. IMPRESSION: 1. No acute abnormality. 2. Echogenic 1.5 cm nodule rising from the lower pole of the left kidney corresponds well with the patient's prior CT and MRI. This is technically indeterminate on this study and may represent a small renal cell carcinoma. An angiomyolipoma can have a similar appearance, however there was no definite fat identified within this lesion on the patient's prior CT. Continued follow-up and urologic consultation is recommended. Electronically Signed  By: Constance Holster M.D.   On: 06/30/2020 22:15    Assessment & Plan:   [name] was seen today for hypothyroidism.  Diagnoses and all orders for this visit:  Other vitamin B12 deficiency anemia- Will continue B12 replacement therapy. -     CBC with Differential/Platelet; Future -     Vitamin B12; Future -     Folate; Future -     Folate -     Vitamin B12 -     CBC with Differential/Platelet  GAD (generalized anxiety disorder) -     ALPRAZolam (XANAX) 0.5 MG tablet; Take 1 tablet (0.5 mg total) by mouth 3 (three) times daily as needed. for anxiety -     escitalopram (LEXAPRO) 20 MG tablet; Take 1 tablet (20 mg total) by mouth at bedtime.  Thrombocytopenia ([place])- Her platelet count is stable.  B12 and folate are normal.  This is likely ITP. -     CBC with Differential/Platelet; Future -     Vitamin B12; Future -     Folate; Future -     Folate -     Vitamin B12 -     CBC with Differential/Platelet  Irritable bowel syndrome with both constipation and diarrhea -     dicyclomine (BENTYL) 10 MG capsule; Take 1 capsule (10 mg total) by mouth 4 (four) times daily -  before meals and at bedtime.  I have discontinued Shazia Mitchener. Deandrade's diphenoxylate-atropine. I have also changed her ALPRAZolam and escitalopram. Additionally, I am having her maintain her brimonidine, dorzolamide, acetaminophen, cetirizine, Xiidra, cholestyramine, icosapent Ethyl, levothyroxine, furosemide, potassium chloride SA, and  dicyclomine.  Meds ordered this encounter  Medications   ALPRAZolam (XANAX) 0.5 MG tablet    Sig: Take 1 tablet (0.5 mg total) by mouth 3 (three) times daily as needed. for anxiety    Dispense:  270 tablet    Refill:  1   dicyclomine (BENTYL) 10 MG capsule    Sig: Take 1 capsule (10 mg total) by mouth 4 (four) times daily -  before meals and at bedtime.    Dispense:  360 capsule    Refill:  1   escitalopram (LEXAPRO) 20 MG tablet    Sig: Take 1 tablet (20 mg total) by mouth at bedtime.    Dispense:  90 tablet    Refill:  1     Follow-up: Return in about 6 months (around 06/03/2021).  Scarlette Calico, MD

## 2020-12-03 ENCOUNTER — Telehealth: Payer: Self-pay | Admitting: Internal Medicine

## 2020-12-03 NOTE — Telephone Encounter (Signed)
Patient is requesting a call back. She said that she had an appointment with you yesterday. Please advise

## 2020-12-11 ENCOUNTER — Telehealth: Payer: PPO

## 2020-12-19 DIAGNOSIS — G4733 Obstructive sleep apnea (adult) (pediatric): Secondary | ICD-10-CM | POA: Diagnosis not present

## 2020-12-24 ENCOUNTER — Other Ambulatory Visit: Payer: Self-pay | Admitting: Internal Medicine

## 2020-12-30 ENCOUNTER — Ambulatory Visit: Payer: PPO | Admitting: Internal Medicine

## 2021-01-11 ENCOUNTER — Other Ambulatory Visit: Payer: Self-pay | Admitting: Internal Medicine

## 2021-01-11 DIAGNOSIS — I10 Essential (primary) hypertension: Secondary | ICD-10-CM

## 2021-01-19 DIAGNOSIS — G4733 Obstructive sleep apnea (adult) (pediatric): Secondary | ICD-10-CM | POA: Diagnosis not present

## 2021-01-25 ENCOUNTER — Ambulatory Visit (INDEPENDENT_AMBULATORY_CARE_PROVIDER_SITE_OTHER): Payer: PPO | Admitting: Internal Medicine

## 2021-01-25 ENCOUNTER — Other Ambulatory Visit: Payer: Self-pay

## 2021-01-25 ENCOUNTER — Ambulatory Visit (INDEPENDENT_AMBULATORY_CARE_PROVIDER_SITE_OTHER)
Admission: RE | Admit: 2021-01-25 | Discharge: 2021-01-25 | Disposition: A | Discharge status: Home / Self Care | Payer: PPO | Source: Ambulatory Visit | Attending: Internal Medicine | Admitting: Internal Medicine

## 2021-01-25 ENCOUNTER — Encounter: Payer: Self-pay | Admitting: Internal Medicine

## 2021-01-25 VITALS — BP 140/80 | HR 71 | Temp 98.2°F | Ht 63.0 in | Wt 173.0 lb

## 2021-01-25 DIAGNOSIS — R1032 Left lower quadrant pain: Secondary | ICD-10-CM | POA: Diagnosis not present

## 2021-01-25 DIAGNOSIS — I700 Atherosclerosis of aorta: Secondary | ICD-10-CM | POA: Diagnosis not present

## 2021-01-25 DIAGNOSIS — K449 Diaphragmatic hernia without obstruction or gangrene: Secondary | ICD-10-CM | POA: Diagnosis not present

## 2021-01-25 DIAGNOSIS — K582 Mixed irritable bowel syndrome: Secondary | ICD-10-CM

## 2021-01-25 DIAGNOSIS — Z87448 Personal history of other diseases of urinary system: Secondary | ICD-10-CM | POA: Diagnosis not present

## 2021-01-25 DIAGNOSIS — K5730 Diverticulosis of large intestine without perforation or abscess without bleeding: Secondary | ICD-10-CM | POA: Diagnosis not present

## 2021-01-25 LAB — URINALYSIS, ROUTINE W REFLEX MICROSCOPIC
Bilirubin Urine: NEGATIVE
Hgb urine dipstick: NEGATIVE
Ketones, ur: NEGATIVE
Leukocytes,Ua: NEGATIVE
Nitrite: NEGATIVE
RBC / HPF: NONE SEEN (ref 0–?)
Specific Gravity, Urine: 1.015 (ref 1.000–1.030)
Total Protein, Urine: NEGATIVE
Urine Glucose: NEGATIVE
Urobilinogen, UA: 0.2 (ref 0.0–1.0)
pH: 6 (ref 5.0–8.0)

## 2021-01-25 LAB — COMPREHENSIVE METABOLIC PANEL
ALT: 26 U/L (ref 0–35)
AST: 29 U/L (ref 0–37)
Albumin: 4.1 g/dL (ref 3.5–5.2)
Alkaline Phosphatase: 136 U/L — ABNORMAL HIGH (ref 39–117)
BUN: 14 mg/dL (ref 6–23)
CO2: 30 mEq/L (ref 19–32)
Calcium: 9.4 mg/dL (ref 8.4–10.5)
Chloride: 103 mEq/L (ref 96–112)
Creatinine, Ser: 0.78 mg/dL (ref 0.40–1.20)
GFR: 71.96 mL/min (ref >60.00)
Glucose, Bld: 85 mg/dL (ref 70–99)
Potassium: 4.4 mEq/L (ref 3.5–5.1)
Sodium: 139 mEq/L (ref 135–145)
Total Bilirubin: 0.7 mg/dL (ref 0.2–1.2)
Total Protein: 7 g/dL (ref 6.0–8.3)

## 2021-01-25 LAB — CBC WITH DIFFERENTIAL/PLATELET
Basophils Absolute: 0 10*3/uL (ref 0.0–0.1)
Basophils Relative: 0.5 % (ref 0.0–3.0)
Eosinophils Absolute: 0.1 10*3/uL (ref 0.0–0.7)
Eosinophils Relative: 2.1 % (ref 0.0–5.0)
HCT: 39.8 % (ref 36.0–46.0)
Hemoglobin: 13.1 g/dL (ref 12.0–15.0)
Lymphocytes Relative: 25.1 % (ref 12.0–46.0)
Lymphs Abs: 0.9 10*3/uL (ref 0.7–4.0)
MCHC: 32.8 g/dL (ref 30.0–36.0)
MCV: 88.3 fl (ref 78.0–100.0)
Monocytes Absolute: 0.3 10*3/uL (ref 0.1–1.0)
Monocytes Relative: 9.1 % (ref 3.0–12.0)
Neutro Abs: 2.2 10*3/uL (ref 1.4–7.7)
Neutrophils Relative %: 63.2 % (ref 43.0–77.0)
Platelets: 124 10*3/uL — ABNORMAL LOW (ref 150.0–400.0)
RBC: 4.51 Mil/uL (ref 3.87–5.11)
RDW: 13.9 % (ref 11.5–15.5)
WBC: 3.4 10*3/uL — ABNORMAL LOW (ref 4.0–10.5)

## 2021-01-25 MED ORDER — ALIGN 4 MG PO CAPS: ORAL_CAPSULE | ORAL | 0 refills | Status: DC

## 2021-01-25 MED ORDER — ONDANSETRON 4 MG PO TBDP: 4.0000 mg | ORAL_TABLET | Freq: Three times a day (TID) | ORAL | 0 refills | Status: DC | PRN

## 2021-01-25 MED ORDER — DICYCLOMINE HCL 10 MG PO CAPS: 10.0000 mg | ORAL_CAPSULE | Freq: Three times a day (TID) | ORAL | 1 refills | Status: DC

## 2021-01-25 NOTE — Patient Instructions (Addendum)
  Blood work was ordered.      A Ct scan was ordered.  Be there at 2:45 at [facility].      We will call you with the results and advise you what treatment is needed at that time.

## 2021-01-25 NOTE — Progress Notes (Signed)
Subjective:    Patient ID: [name], female    DOB: 03-23-1941, 80 y.o.   MRN: [id number]  HPI The patient is here for an acute visit.  Abdominal pain - her symptoms started 2 days ago.  She had abdominal pain throughout her abdomen.  She had abdominal bloating.  There has not any change in her symptoms since they started.   She has about 10 BMs a day  loose to watery stool - this is chronic, but worse now.   No recent travel, abx, new foods or medications.     Pain now 2-3 / 10 ,  yesterday pain was 7-8/10  she has not eaten today  Last colonoscopy did show diverticulosis.  No history of diverticulitis.   Medications and allergies reviewed with patient and updated if appropriate.  Patient Active Problem List   Diagnosis Date Noted   Bile salt-induced diarrhea 09/03/2020   Statin intolerance 12/03/2019   Acquired hypothyroidism 12/02/2019   Need for hepatitis C screening test 12/02/2019   Loud snoring 12/02/2019   Pure hypertriglyceridemia 02/28/2019   Reactive airway disease 06/23/2017   TIA (transient ischemic attack) 02/28/2017   Depression with somatization 12/13/2016   Allergic rhinitis due to pollen 06/21/2016   Simple chronic bronchitis ([place]) 06/21/2016   Routine general medical examination at a health care facility 11/29/2015   GAD (generalized anxiety disorder) 12/02/2014   IBS (irritable bowel syndrome) 01/20/2014   B12 deficiency anemia 07/10/2013   Vitamin D deficiency 01/04/2013   HTN (hypertension) 07/26/2011   CAD (coronary artery disease) [id number]   Nonalcoholic steatohepatitis ([facility]) 06/20/2009   OBESITY 09/08/2007   OSTEOPENIA 09/07/2007   Hyperlipidemia with target LDL less than 100 07/30/2007    Current Outpatient Medications on File Prior to Visit  Medication Sig Dispense Refill   acetaminophen (TYLENOL) 500 MG tablet Take 500 mg by mouth every 6 (six) hours as needed.     ALPRAZolam (XANAX) 0.5 MG tablet Take 1 tablet (0.5 mg total)  by mouth 3 (three) times daily as needed. for anxiety 270 tablet 1   brimonidine (ALPHAGAN) 0.15 % ophthalmic solution Place 1 drop into both eyes 3 (three) times daily.  3   cetirizine (ZYRTEC) 10 MG tablet Take 1 tablet (10 mg total) by mouth daily. 90 tablet 3   cholestyramine (QUESTRAN) 4 g packet Take 1 packet (4 g total) by mouth daily. 30 packet 5   dicyclomine (BENTYL) 10 MG capsule Take 1 capsule (10 mg total) by mouth 4 (four) times daily -  before meals and at bedtime. 360 capsule 1   dorzolamide (TRUSOPT) 2 % ophthalmic solution Place 1 drop into both eyes 3 (three) times daily.  1   escitalopram (LEXAPRO) 20 MG tablet Take 1 tablet (20 mg total) by mouth at bedtime. 90 tablet 1   furosemide (LASIX) 20 MG tablet TAKE 1 TABLET BY MOUTH IN THE MORNING 90 tablet 0   icosapent Ethyl (VASCEPA) 1 g capsule Take 2 g by mouth 2 (two) times daily.     levothyroxine (SYNTHROID) 25 MCG tablet TAKE 1 TABLET BY MOUTH ONCE DAILY BEFORE BREAKFAST 90 tablet 0   Lifitegrast (XIIDRA) 5 % SOLN Apply 2 drops to eye 2 (two) times daily.     potassium chloride SA (KLOR-CON) 20 MEQ tablet Take 1 tablet by mouth twice daily 180 tablet 0   No current facility-administered medications on file prior to visit.    Past Medical History:  Diagnosis Date  Anxiety state, unspecified    CAD (coronary artery disease)    Disorder of bone and cartilage, unspecified    Diverticulosis of colon (without mention of hemorrhage)    Dizziness and giddiness    Fatty liver    Fibromyalgia    Obesity, unspecified    Other and unspecified hyperlipidemia    Other chronic nonalcoholic liver disease    Skin cancer    basal cell on face   Stroke ([place])    TIA   TIA (transient ischemic attack)    Unspecified essential hypertension     Past Surgical History:  Procedure Laterality Date   BACK SURGERY     CHOLECYSTECTOMY     MOHS SURGERY     TONSILLECTOMY AND ADENOIDECTOMY     TOTAL ABDOMINAL HYSTERECTOMY W/ BILATERAL  SALPINGOOPHORECTOMY      Social History   Socioeconomic History   Marital status: Married    Spouse name: Thompson Grayer. Mitchelle Sr.   Number of children: 4   Years of education: Not on file   Highest education level: Not on file  Occupational History    Employer: RETIRED  Tobacco Use   Smoking status: Never   Smokeless tobacco: Never  Vaping Use   Vaping Use: Never used  Substance and Sexual Activity   Alcohol use: No   Drug use: No   Sexual activity: Not Currently  Other Topics Concern   Not on file  Social History Narrative   Not on file   Social Determinants of Health   Financial Resource Strain: Medium Risk   Difficulty of Paying Living Expenses: Somewhat hard  Food Insecurity: Not on file  Transportation Needs: Not on file  Physical Activity: Not on file  Stress: Not on file  Social Connections: Not on file    Family History  Problem Relation Age of Onset   Stomach cancer Mother    Cancer Brother    Breast cancer Sister    Other Father    Breast cancer Sister    Colon cancer Neg Hx     Review of Systems  Constitutional:  Positive for appetite change (decreased). Negative for chills and fever.  HENT:  Negative for congestion, sinus pain and sore throat.   Respiratory:  Positive for cough (dry). Negative for shortness of breath.   Cardiovascular:  Negative for chest pain and palpitations.  Gastrointestinal:  Positive for abdominal distention, abdominal pain, diarrhea (chronic) and nausea (minimal). Negative for blood in stool, constipation and vomiting.       No gerd  Genitourinary:  Negative for dysuria, frequency, hematuria and urgency.       No change in urine color  Musculoskeletal:  Positive for back pain (yesterday). Negative for myalgias.  Neurological:  Negative for light-headedness and headaches.      Objective:   Vitals:   01/25/21 1045  BP: 140/80  Pulse: 71  Temp: 98.2 F (36.8 C)  SpO2: 97%   BP Readings from Last 3 Encounters:   01/25/21 140/80  12/02/20 136/84  10/29/20 132/66   Wt Readings from Last 3 Encounters:  01/25/21 173 lb (78.5 kg)  12/02/20 172 lb (78 kg)  10/29/20 172 lb 3.2 oz (78.1 kg)   Body mass index is 30.65 kg/m.   Physical Exam Constitutional:      General: She is not in acute distress.    Appearance: She is well-developed. She is not ill-appearing.  HENT:     Head: Normocephalic and atraumatic.  Abdominal:  General: Bowel sounds are normal.     Palpations: Abdomen is soft.     Tenderness: There is abdominal tenderness in the epigastric area, periumbilical area, suprapubic area and left lower quadrant. There is no right CVA tenderness, left CVA tenderness, guarding or rebound.     Hernia: No hernia is present.  Skin:    General: Skin is warm and dry.  Neurological:     Mental Status: She is alert.           Assessment & Plan:    Left lower quadrant abdominal pain: Focus of pain in her left lower quadrant, but she does have some periumbilical and epigastric pain as well Associated abdominal bloating and frequent, nonbloody, watery-loose stools (typically has several loose-watery stools a day so that is not completely new, but it is slightly worse) Yesterday pain was 7/10 and currently 2-3/10 Concern for diverticulitis, colitis/enteritis, less likely UTI, appendicitis, pancreatitis, IBS CBC, CMP, UA today CT abdomen and pelvis with oral contrast today Ct showed no cause for abdominal pain - likely IBS flare Start dicyclomine 10 mg QID prn Zofran 4 mg ODT Q 8 hr prn nausea Align probiotics  Call if no improvement    This visit occurred during the SARS-CoV-2 public health emergency.  Safety protocols were in place, including screening questions prior to the visit, additional usage of staff PPE, and extensive cleaning of exam room while observing appropriate contact time as indicated for disinfecting solutions.

## 2021-01-27 DIAGNOSIS — H2512 Age-related nuclear cataract, left eye: Secondary | ICD-10-CM | POA: Diagnosis not present

## 2021-01-27 DIAGNOSIS — H401120 Primary open-angle glaucoma, left eye, stage unspecified: Secondary | ICD-10-CM | POA: Diagnosis not present

## 2021-01-27 DIAGNOSIS — H401122 Primary open-angle glaucoma, left eye, moderate stage: Secondary | ICD-10-CM | POA: Diagnosis not present

## 2021-01-27 DIAGNOSIS — H268 Other specified cataract: Secondary | ICD-10-CM | POA: Diagnosis not present

## 2021-02-19 DIAGNOSIS — G4733 Obstructive sleep apnea (adult) (pediatric): Secondary | ICD-10-CM | POA: Diagnosis not present

## 2021-03-05 ENCOUNTER — Telehealth: Payer: Self-pay | Admitting: Internal Medicine

## 2021-03-05 DIAGNOSIS — G4733 Obstructive sleep apnea (adult) (pediatric): Secondary | ICD-10-CM | POA: Diagnosis not present

## 2021-03-05 NOTE — Telephone Encounter (Signed)
Unable to leave a message for patient to call me back at [phone] to schedule Medicare Annual Wellness Visit   Last AWV  12/02/19  Please schedule at anytime with [facility] if patient calls the office back.    40 Minutes appointment   Any questions, please call me at [phone]

## 2021-03-08 ENCOUNTER — Other Ambulatory Visit: Payer: Self-pay | Admitting: Internal Medicine

## 2021-03-15 ENCOUNTER — Telehealth: Payer: Self-pay | Admitting: Internal Medicine

## 2021-03-15 NOTE — Telephone Encounter (Signed)
Left message for patient to call me back at [phone] to schedule Medicare Annual Wellness Visit   Last AWV  12/02/19  Please schedule at anytime with [facility] if patient calls the office back.    3 Minutes appointment   Any questions, please call me at [phone]

## 2021-03-21 DIAGNOSIS — G4733 Obstructive sleep apnea (adult) (pediatric): Secondary | ICD-10-CM | POA: Diagnosis not present

## 2021-03-30 DIAGNOSIS — K5730 Diverticulosis of large intestine without perforation or abscess without bleeding: Secondary | ICD-10-CM | POA: Diagnosis not present

## 2021-03-30 DIAGNOSIS — K838 Other specified diseases of biliary tract: Secondary | ICD-10-CM | POA: Diagnosis not present

## 2021-03-30 DIAGNOSIS — N281 Cyst of kidney, acquired: Secondary | ICD-10-CM | POA: Diagnosis not present

## 2021-03-30 DIAGNOSIS — N2889 Other specified disorders of kidney and ureter: Secondary | ICD-10-CM | POA: Diagnosis not present

## 2021-03-30 DIAGNOSIS — K449 Diaphragmatic hernia without obstruction or gangrene: Secondary | ICD-10-CM | POA: Diagnosis not present

## 2021-04-01 DIAGNOSIS — N281 Cyst of kidney, acquired: Secondary | ICD-10-CM | POA: Diagnosis not present

## 2021-04-01 DIAGNOSIS — D49512 Neoplasm of unspecified behavior of left kidney: Secondary | ICD-10-CM | POA: Diagnosis not present

## 2021-04-07 DIAGNOSIS — H2511 Age-related nuclear cataract, right eye: Secondary | ICD-10-CM | POA: Diagnosis not present

## 2021-04-07 DIAGNOSIS — H401110 Primary open-angle glaucoma, right eye, stage unspecified: Secondary | ICD-10-CM | POA: Diagnosis not present

## 2021-04-07 DIAGNOSIS — H401111 Primary open-angle glaucoma, right eye, mild stage: Secondary | ICD-10-CM | POA: Diagnosis not present

## 2021-04-08 DIAGNOSIS — N281 Cyst of kidney, acquired: Secondary | ICD-10-CM | POA: Diagnosis not present

## 2021-04-12 ENCOUNTER — Encounter: Payer: Self-pay | Admitting: Internal Medicine

## 2021-04-12 ENCOUNTER — Other Ambulatory Visit: Payer: Self-pay | Admitting: Internal Medicine

## 2021-04-13 MED ORDER — VASCEPA 1 G PO CAPS: 2.0000 g | ORAL_CAPSULE | Freq: Two times a day (BID) | ORAL | 0 refills | Status: DC

## 2021-04-15 ENCOUNTER — Other Ambulatory Visit: Payer: Self-pay | Admitting: Internal Medicine

## 2021-04-15 DIAGNOSIS — I10 Essential (primary) hypertension: Secondary | ICD-10-CM

## 2021-04-20 NOTE — Telephone Encounter (Signed)
Received message from [facility] that Hypercholesterolemia grant is active again. Re-enrolled patient for Vascepa copay assistance, she is approved 03/21/2021-03/20/2022.  BIN: [id number] PCN: [id number] GRP: [id number] ID: [id number]

## 2021-04-21 DIAGNOSIS — G4733 Obstructive sleep apnea (adult) (pediatric): Secondary | ICD-10-CM | POA: Diagnosis not present

## 2021-04-22 ENCOUNTER — Telehealth: Payer: Self-pay | Admitting: Pharmacist

## 2021-04-22 NOTE — Progress Notes (Signed)
    Chronic Care Management Pharmacy Assistant   Name: JENNESSA TRIGO  MRN: [id number] DOB: [birth year]  Call patient to let her know that she has been approved for Vascepa through [facility] and that she can pick up her medication tomorrow at [facility] after 12pm   [place] Pharmacist Assistant [phone]

## 2021-04-30 ENCOUNTER — Ambulatory Visit: Payer: PPO

## 2021-05-12 ENCOUNTER — Other Ambulatory Visit: Payer: Self-pay | Admitting: Internal Medicine

## 2021-05-31 ENCOUNTER — Other Ambulatory Visit: Payer: Self-pay | Admitting: Internal Medicine

## 2021-05-31 DIAGNOSIS — I10 Essential (primary) hypertension: Secondary | ICD-10-CM

## 2021-06-01 ENCOUNTER — Other Ambulatory Visit: Payer: Self-pay

## 2021-06-01 ENCOUNTER — Ambulatory Visit (INDEPENDENT_AMBULATORY_CARE_PROVIDER_SITE_OTHER): Payer: PPO

## 2021-06-01 VITALS — BP 118/82 | HR 61 | Temp 98.2°F | Resp 16 | Ht 63.0 in | Wt 170.8 lb

## 2021-06-01 DIAGNOSIS — Z0000 Encounter for general adult medical examination without abnormal findings: Secondary | ICD-10-CM

## 2021-06-01 NOTE — Progress Notes (Signed)
Subjective:   [name] is a 80 y.o. female who presents for Medicare Annual (Subsequent) preventive examination.  Review of Systems     Cardiac Risk Factors include: advanced age (>43men, >45 women);obesity (BMI >30kg/m2);hypertension     Objective:    Today's Vitals   06/01/21 1041 06/01/21 1121  BP: 118/82   Pulse: 61   Resp: 16   Temp: 98.2 F (36.8 C)   SpO2: 97%   Weight: 170 lb 12.8 oz (77.5 kg)   Height: 5\' 3"  (1.6 m)   PainSc: 8  8   PainLoc: Hip    Body mass index is 30.26 kg/m.  Advanced Directives 06/01/2021 12/02/2019 07/06/2017 04/11/2017 02/28/2017 02/28/2017 11/29/2015  Does Patient Have a Medical Advance Directive? Yes Yes No No Yes No Yes  Type of Advance Directive Living will;Healthcare Power of [place];Living will - - Living will - [place];Living will  Does patient want to make changes to medical advance directive? No - Patient declined No - Patient declined - Yes (ED - Information included in AVS) No - Patient declined - No - Patient declined  Copy of [place] in Chart? No - copy requested No - copy requested - - - - Yes  Would patient like information on creating a medical advance directive? - - Yes (ED - Information included in AVS) - - No - Patient declined -  Pre-existing out of facility DNR order (yellow form or pink MOST form) - - - - - - -    Current Medications (verified) Outpatient Encounter Medications as of 06/01/2021  Medication Sig   VASCEPA 1 g capsule Take 2 capsules (2 g total) by mouth 2 (two) times daily.   acetaminophen (TYLENOL) 500 MG tablet Take 500 mg by mouth every 6 (six) hours as needed.   ALPRAZolam (XANAX) 0.5 MG tablet Take 1 tablet (0.5 mg total) by mouth 3 (three) times daily as needed. for anxiety   brimonidine (ALPHAGAN) 0.15 % ophthalmic solution Place 1 drop into both eyes 3 (three) times daily.   cetirizine (ZYRTEC) 10 MG tablet Take 1 tablet  (10 mg total) by mouth daily.   cholestyramine (QUESTRAN) 4 g packet Take 1 packet (4 g total) by mouth daily.   dicyclomine (BENTYL) 10 MG capsule Take 1 capsule (10 mg total) by mouth 4 (four) times daily -  before meals and at bedtime.   dorzolamide (TRUSOPT) 2 % ophthalmic solution Place 1 drop into both eyes 3 (three) times daily.   escitalopram (LEXAPRO) 20 MG tablet Take 1 tablet (20 mg total) by mouth at bedtime.   furosemide (LASIX) 20 MG tablet TAKE 1 TABLET BY MOUTH IN THE MORNING   levothyroxine (SYNTHROID) 25 MCG tablet TAKE 1 TABLET BY MOUTH ONCE DAILY BEFORE BREAKFAST   Lifitegrast (XIIDRA) 5 % SOLN Apply 2 drops to eye 2 (two) times daily.   ondansetron (ZOFRAN ODT) 4 MG disintegrating tablet Take 1 tablet (4 mg total) by mouth every 8 (eight) hours as needed for nausea or vomiting.   potassium chloride SA (KLOR-CON M) 20 MEQ tablet Take 1 tablet by mouth twice daily   Probiotic Product (ALIGN) 4 MG CAPS Take daily as directed on bottle   No facility-administered encounter medications on file as of 06/01/2021.    Allergies (verified) Diltiazem hcl, Statins, Zetia [ezetimibe], Codeine, Sulfonamide derivatives, Tape, and Tramadol   History: Past Medical History:  Diagnosis Date   Anxiety state, unspecified  CAD (coronary artery disease)    Disorder of bone and cartilage, unspecified    Diverticulosis of colon (without mention of hemorrhage)    Dizziness and giddiness    Fatty liver    Fibromyalgia    Obesity, unspecified    Other and unspecified hyperlipidemia    Other chronic nonalcoholic liver disease    Skin cancer    basal cell on face   Stroke ([place])    TIA   TIA (transient ischemic attack)    Unspecified essential hypertension    Past Surgical History:  Procedure Laterality Date   BACK SURGERY     CHOLECYSTECTOMY     MOHS SURGERY     TONSILLECTOMY AND ADENOIDECTOMY     TOTAL ABDOMINAL HYSTERECTOMY W/ BILATERAL SALPINGOOPHORECTOMY     Family History   Problem Relation Age of Onset   Stomach cancer Mother    Cancer Brother    Breast cancer Sister    Other Father    Breast cancer Sister    Colon cancer Neg Hx    Social History   Socioeconomic History   Marital status: Married    Spouse name: Thompson Grayer. Peitz Sr.   Number of children: 4   Years of education: Not on file   Highest education level: Not on file  Occupational History    Employer: RETIRED  Tobacco Use   Smoking status: Never   Smokeless tobacco: Never  Vaping Use   Vaping Use: Never used  Substance and Sexual Activity   Alcohol use: No   Drug use: No   Sexual activity: Not Currently  Other Topics Concern   Not on file  Social History Narrative   Not on file   Social Determinants of Health   Financial Resource Strain: Low Risk    Difficulty of Paying Living Expenses: Not hard at all  Food Insecurity: No Food Insecurity   Worried About Charity fundraiser in the Last Year: Never true   [place] in the Last Year: Never true  Transportation Needs: No Transportation Needs   Lack of Transportation (Medical): No   Lack of Transportation (Non-Medical): No  Physical Activity: Sufficiently Active   Days of Exercise per Week: 5 days   Minutes of Exercise per Session: 30 min  Stress: No Stress Concern Present   Feeling of Stress : Not at all  Social Connections: Moderately Integrated   Frequency of Communication with Friends and Family: More than three times a week   Frequency of Social Gatherings with Friends and Family: More than three times a week   Attends Religious Services: More than 4 times per year   Active Member of [employer] or Organizations: Yes   Attends Archivist Meetings: More than 4 times per year   Marital Status: Widowed    Tobacco Counseling Counseling given: Not Answered   Clinical Intake:  Pre-visit preparation completed: Yes  Pain : 0-10 Pain Score: 8  Pain Type: Acute pain Pain Location: Hip Pain Orientation:  Left Pain Descriptors / Indicators: Discomfort Pain Onset: 1 to 4 weeks ago Pain Frequency: Constant Pain Relieving Factors: Tylenol  Pain Relieving Factors: Tylenol  BMI - recorded: 30.26 Nutritional Status: BMI > 30  Obese Nutritional Risks: None  How often do you need to have someone help you when you read instructions, pamphlets, or other written materials from your doctor or pharmacy?: 1 - Never What is the last grade level you completed in school?: 9th grade  Diabetic?  no  Interpreter Needed?: No  Information entered by :: Lisette Abu, LPN   Activities of Daily Living In your present state of health, do you have any difficulty performing the following activities: 06/01/2021 12/02/2020  Hearing? N N  Vision? N N  Difficulty concentrating or making decisions? N N  Walking or climbing stairs? N N  Dressing or bathing? N N  Doing errands, shopping? N N  Preparing Food and eating ? N -  Using the Toilet? N -  In the past six months, have you accidently leaked urine? N -  Do you have problems with loss of bowel control? N -  Managing your Medications? N -  Managing your Finances? N -  Housekeeping or managing your Housekeeping? N -  Some recent data might be hidden    Patient Care Team: Janith Lima, MD as PCP - General (Internal Medicine) Charlton Haws, [facility] as Pharmacist (Pharmacist) Jola Schmidt, MD as Consulting Physician (Ophthalmology)  Indicate any recent Medical Services you may have received from other than [facility] providers in the past year (date may be approximate).     Assessment:   This is a routine wellness examination for Kaliyah.  Hearing/Vision screen Hearing Screening - Comments:: Patient denied any hearing difficulty. No hearing aids. Vision Screening - Comments:: Patient wears eyeglasses.  Annual eye exam done by: Dr. Jola Schmidt.  Dietary issues and exercise activities discussed: Current Exercise Habits: Home exercise  routine, Type of exercise: walking, Time (Minutes): 30, Frequency (Times/Week): 5, Weekly Exercise (Minutes/Week): 150, Intensity: Moderate, Exercise limited by: orthopedic condition(s)   Goals Addressed   None   Depression Screen PHQ 2/9 Scores 06/01/2021 10/29/2020 12/02/2019 12/02/2019 08/05/2019 02/27/2019 04/11/2017  PHQ - 2 Score 0 0 0 0 0 2 2  PHQ- 9 Score - - - 2 - 4 2    Fall Risk Fall Risk  06/01/2021 10/29/2020 12/02/2019 02/27/2019 01/18/2019  Falls in the past year? 1 1 0 0 1  Comment - - - - Emmi Telephone Survey: data to providers prior to load  Number falls in past yr: 0 0 0 0 1  Comment - - - - Emmi Telephone Survey Actual Response = 1  Injury with Fall? 1 0 0 0 1  Risk for fall due to : - - No Fall Risks Impaired balance/gait -  Follow up Falls prevention discussed;Follow up appointment Falls evaluation completed Falls evaluation completed;Falls prevention discussed Falls evaluation completed -    FALL RISK PREVENTION PERTAINING TO THE HOME:  Any stairs in or around the home? No  If so, are there any without handrails? No  Home free of loose throw rugs in walkways, pet beds, electrical cords, etc? Yes  Adequate lighting in your home to reduce risk of falls? Yes   ASSISTIVE DEVICES UTILIZED TO PREVENT FALLS:  Life alert? No  Use of a cane, walker or w/c? No  Grab bars in the bathroom? Yes  Shower chair or bench in shower? Yes  Elevated toilet seat or a handicapped toilet? Yes   TIMED UP AND GO:  Was the test performed? Yes .  Length of time to ambulate 10 feet: 8 sec.   Gait steady and fast without use of assistive device  Cognitive Function: Normal cognitive status assessed by direct observation by this Nurse Health Advisor. No abnormalities found.   MMSE - Mini Mental State Exam 04/11/2017  Orientation to time 5  Orientation to Place 5  Registration 3  Attention/ Calculation 5  Recall 2  Language- name 2 objects 2  Language- repeat 1  Language- follow 3  step command 3  Language- read & follow direction 1  Write a sentence 1  Copy design 1  Total score 29     6CIT Screen 12/02/2019  What Year? 0 points  What month? 0 points  What time? 0 points  Count back from 20 0 points  Months in reverse 2 points  Repeat phrase 2 points  Total Score 4    Immunizations Immunization History  Administered Date(s) Administered   Fluad Quad(high Dose 65+) 02/27/2019   Influenza Split 03/27/2011, 03/30/2012   Influenza Whole 04/06/2009, 04/12/2010   Influenza, High Dose Seasonal PF 03/09/2017   Influenza,inj,Quad PF,6+ Mos 04/18/2013, 02/19/2014, 03/31/2015, 03/04/2016   Influenza-Unspecified 02/18/2018, 04/09/2020, 04/08/2021   Moderna Sars-Covid-2 Vaccination 07/03/2019, 07/31/2019, 04/10/2020   Pneumococcal Conjugate-13 03/18/2016   Pneumococcal Polysaccharide-23 03/21/2007   Tdap 10/17/2011, 10/04/2017   Zoster Recombinat (Shingrix) 12/16/2019, 03/18/2020    TDAP status: Up to date  Flu Vaccine status: Up to date  Pneumococcal vaccine status: Up to date  Covid-19 vaccine status: Completed vaccines  Qualifies for Shingles Vaccine? Yes   Zostavax completed No   Shingrix Completed?: Yes  Screening Tests Health Maintenance  Topic Date Due   COVID-19 Vaccine (4 - Booster for Moderna series) 06/05/2020   COLONOSCOPY (Pts 45-68yrs Insurance coverage will need to be confirmed)  05/02/2021   TETANUS/TDAP  10/05/2027   Pneumonia Vaccine 57+ Years old  Completed   INFLUENZA VACCINE  Completed   Zoster Vaccines- Shingrix  Completed   HPV VACCINES  Aged Out   DEXA SCAN  Discontinued    Health Maintenance  Health Maintenance Due  Topic Date Due   COVID-19 Vaccine (4 - Booster for Moderna series) 06/05/2020   COLONOSCOPY (Pts 45-46yrs Insurance coverage will need to be confirmed)  05/02/2021    Colorectal cancer screening: Type of screening: Colonoscopy. Completed 05/02/2018. Repeat every 3 years  Mammogram status: Completed  01/2021. Repeat every year  Bone density: never done  Lung Cancer Screening: (Low Dose CT Chest recommended if Age 6-80 years, 30 pack-year currently smoking OR have quit w/in 15years.) does not qualify.   Lung Cancer Screening Referral: no  Additional Screening:  Hepatitis C Screening: does not qualify; Completed no  Vision Screening: Recommended annual ophthalmology exams for early detection of glaucoma and other disorders of the eye. Is the patient up to date with their annual eye exam?  Yes  Who is the provider or what is the name of the office in which the patient attends annual eye exams? Jola Schmidt, MD. If pt is not established with a provider, would they like to be referred to a provider to establish care? No .   Dental Screening: Recommended annual dental exams for proper oral hygiene  Community Resource Referral / Chronic Care Management: CRR required this visit?  No   CCM required this visit?  No      Plan:     I have personally reviewed and noted the following in the patients chart:   Medical and social history Use of alcohol, tobacco or illicit drugs  Current medications and supplements including opioid prescriptions.  Functional ability and status Nutritional status Physical activity Advanced directives List of other physicians Hospitalizations, surgeries, and ER visits in previous 12 months Vitals Screenings to include cognitive, depression, and falls Referrals and appointments  In addition, I have reviewed and discussed with patient certain preventive protocols, quality metrics,  and best practice recommendations. A written personalized care plan for preventive services as well as general preventive health recommendations were provided to patient.     Sheral Flow, LPN   [id number]   Nurse Notes:  Hearing Screening - Comments:: Patient denied any hearing difficulty. No hearing aids. Vision Screening - Comments:: Patient wears eyeglasses.   Annual eye exam done by: Dr. Jola Schmidt.

## 2021-06-01 NOTE — Patient Instructions (Signed)
[name] , Thank you for taking time to come for your Medicare Wellness Visit. I appreciate your ongoing commitment to your health goals. Please review the following plan we discussed and let me know if I can assist you in the future.   Screening recommendations/referrals: Colonoscopy: 05/02/2018; due every 3 years Mammogram: done 2022 with Dr. Dala Dock Density: never done Recommended yearly ophthalmology/optometry visit for glaucoma screening and checkup Recommended yearly dental visit for hygiene and checkup  Vaccinations: Influenza vaccine: 04/08/2021 Pneumococcal vaccine: 03/21/2007, 03/18/2016 Tdap vaccine: 10/04/2017; due every 10 years Shingles vaccine: 12/06/2019, 03/18/2020   Covid-19: 07/03/2019, 07/31/2019, 04/10/2020  Advanced directives: Please bring a copy of your health care power of attorney and living will to the office at your convenience.  Conditions/risks identified: Yes; Client understands the importance of follow-up with providers by attending scheduled visits and discussed goals to eat healthier, increase physical activity, exercise the brain, socialize more, get enough sleep and make time for laughter.  Next appointment: Please schedule your next Medicare Wellness Visit with your Nurse Health Advisor in 1 year by calling [phone].   Preventive Care 14 Years and Older, Female Preventive care refers to lifestyle choices and visits with your health care provider that can promote health and wellness. What does preventive care include? A yearly physical exam. This is also called an annual well check. Dental exams once or twice a year. Routine eye exams. Ask your health care provider how often you should have your eyes checked. Personal lifestyle choices, including: Daily care of your teeth and gums. Regular physical activity. Eating a healthy diet. Avoiding tobacco and drug use. Limiting alcohol use. Practicing safe sex. Taking low-dose aspirin every day. Taking  vitamin and mineral supplements as recommended by your health care provider. What happens during an annual well check? The services and screenings done by your health care provider during your annual well check will depend on your age, overall health, lifestyle risk factors, and family history of disease. Counseling  Your health care provider may ask you questions about your: Alcohol use. Tobacco use. Drug use. Emotional well-being. Home and relationship well-being. Sexual activity. Eating habits. History of falls. Memory and ability to understand (cognition). Work and work Statistician. Reproductive health. Screening  You may have the following tests or measurements: Height, weight, and BMI. Blood pressure. Lipid and cholesterol levels. These may be checked every 5 years, or more frequently if you are over 1 years old. Skin check. Lung cancer screening. You may have this screening every year starting at age 67 if you have a 30-pack-year history of smoking and currently smoke or have quit within the past 15 years. Fecal occult blood test (FOBT) of the stool. You may have this test every year starting at age 75. Flexible sigmoidoscopy or colonoscopy. You may have a sigmoidoscopy every 5 years or a colonoscopy every 10 years starting at age 28. Hepatitis C blood test. Hepatitis B blood test. Sexually transmitted disease (STD) testing. Diabetes screening. This is done by checking your blood sugar (glucose) after you have not eaten for a while (fasting). You may have this done every 1-3 years. Bone density scan. This is done to screen for osteoporosis. You may have this done starting at age 51. Mammogram. This may be done every 1-2 years. Talk to your health care provider about how often you should have regular mammograms. Talk with your health care provider about your test results, treatment options, and if necessary, the need for more tests. Vaccines  Your health care provider may  recommend certain vaccines, such as: Influenza vaccine. This is recommended every year. Tetanus, diphtheria, and acellular pertussis (Tdap, Td) vaccine. You may need a Td booster every 10 years. Zoster vaccine. You may need this after age 44. Pneumococcal 13-valent conjugate (PCV13) vaccine. One dose is recommended after age 12. Pneumococcal polysaccharide (PPSV23) vaccine. One dose is recommended after age 33. Talk to your health care provider about which screenings and vaccines you need and how often you need them. This information is not intended to replace advice given to you by your health care provider. Make sure you discuss any questions you have with your health care provider. Document Released: 07/03/2015 Document Revised: 02/24/2016 Document Reviewed: 04/07/2015 Elsevier Interactive Patient Education  2017 [place] Prevention in the Home Falls can cause injuries. They can happen to people of all ages. There are many things you can do to make your home safe and to help prevent falls. What can I do on the outside of my home? Regularly fix the edges of walkways and driveways and fix any cracks. Remove anything that might make you trip as you walk through a door, such as a raised step or threshold. Trim any bushes or trees on the path to your home. Use bright outdoor lighting. Clear any walking paths of anything that might make someone trip, such as rocks or tools. Regularly check to see if handrails are loose or broken. Make sure that both sides of any steps have handrails. Any raised decks and porches should have guardrails on the edges. Have any leaves, snow, or ice cleared regularly. Use sand or salt on walking paths during winter. Clean up any spills in your garage right away. This includes oil or grease spills. What can I do in the bathroom? Use night lights. Install grab bars by the toilet and in the tub and shower. Do not use towel bars as grab bars. Use non-skid  mats or decals in the tub or shower. If you need to sit down in the shower, use a plastic, non-slip stool. Keep the floor dry. Clean up any water that spills on the floor as soon as it happens. Remove soap buildup in the tub or shower regularly. Attach bath mats securely with double-sided non-slip rug tape. Do not have throw rugs and other things on the floor that can make you trip. What can I do in the bedroom? Use night lights. Make sure that you have a light by your bed that is easy to reach. Do not use any sheets or blankets that are too big for your bed. They should not hang down onto the floor. Have a firm chair that has side arms. You can use this for support while you get dressed. Do not have throw rugs and other things on the floor that can make you trip. What can I do in the kitchen? Clean up any spills right away. Avoid walking on wet floors. Keep items that you use a lot in easy-to-reach places. If you need to reach something above you, use a strong step stool that has a grab bar. Keep electrical cords out of the way. Do not use floor polish or wax that makes floors slippery. If you must use wax, use non-skid floor wax. Do not have throw rugs and other things on the floor that can make you trip. What can I do with my stairs? Do not leave any items on the stairs. Make sure that there are  handrails on both sides of the stairs and use them. Fix handrails that are broken or loose. Make sure that handrails are as long as the stairways. Check any carpeting to make sure that it is firmly attached to the stairs. Fix any carpet that is loose or worn. Avoid having throw rugs at the top or bottom of the stairs. If you do have throw rugs, attach them to the floor with carpet tape. Make sure that you have a light switch at the top of the stairs and the bottom of the stairs. If you do not have them, ask someone to add them for you. What else can I do to help prevent falls? Wear shoes  that: Do not have high heels. Have rubber bottoms. Are comfortable and fit you well. Are closed at the toe. Do not wear sandals. If you use a stepladder: Make sure that it is fully opened. Do not climb a closed stepladder. Make sure that both sides of the stepladder are locked into place. Ask someone to hold it for you, if possible. Clearly mark and make sure that you can see: Any grab bars or handrails. First and last steps. Where the edge of each step is. Use tools that help you move around (mobility aids) if they are needed. These include: Canes. Walkers. Scooters. Crutches. Turn on the lights when you go into a dark area. Replace any light bulbs as soon as they burn out. Set up your furniture so you have a clear path. Avoid moving your furniture around. If any of your floors are uneven, fix them. If there are any pets around you, be aware of where they are. Review your medicines with your doctor. Some medicines can make you feel dizzy. This can increase your chance of falling. Ask your doctor what other things that you can do to help prevent falls. This information is not intended to replace advice given to you by your health care provider. Make sure you discuss any questions you have with your health care provider. Document Released: 04/02/2009 Document Revised: 11/12/2015 Document Reviewed: 07/11/2014 Elsevier Interactive Patient Education  2017 [employer].

## 2021-06-02 ENCOUNTER — Encounter: Payer: Self-pay | Admitting: Internal Medicine

## 2021-06-02 ENCOUNTER — Ambulatory Visit (INDEPENDENT_AMBULATORY_CARE_PROVIDER_SITE_OTHER): Payer: PPO | Admitting: Internal Medicine

## 2021-06-02 ENCOUNTER — Ambulatory Visit: Payer: PPO | Admitting: Internal Medicine

## 2021-06-02 ENCOUNTER — Other Ambulatory Visit: Payer: Self-pay | Admitting: Internal Medicine

## 2021-06-02 ENCOUNTER — Ambulatory Visit (INDEPENDENT_AMBULATORY_CARE_PROVIDER_SITE_OTHER): Payer: PPO

## 2021-06-02 VITALS — BP 126/76 | HR 68 | Temp 98.2°F | Ht 63.0 in | Wt 173.0 lb

## 2021-06-02 DIAGNOSIS — S79912A Unspecified injury of left hip, initial encounter: Secondary | ICD-10-CM

## 2021-06-02 DIAGNOSIS — M25552 Pain in left hip: Secondary | ICD-10-CM | POA: Diagnosis not present

## 2021-06-02 DIAGNOSIS — Z0001 Encounter for general adult medical examination with abnormal findings: Secondary | ICD-10-CM | POA: Insufficient documentation

## 2021-06-02 DIAGNOSIS — E039 Hypothyroidism, unspecified: Secondary | ICD-10-CM

## 2021-06-02 DIAGNOSIS — I10 Essential (primary) hypertension: Secondary | ICD-10-CM

## 2021-06-02 DIAGNOSIS — D518 Other vitamin B12 deficiency anemias: Secondary | ICD-10-CM | POA: Diagnosis not present

## 2021-06-02 DIAGNOSIS — R197 Diarrhea, unspecified: Secondary | ICD-10-CM | POA: Diagnosis not present

## 2021-06-02 DIAGNOSIS — F411 Generalized anxiety disorder: Secondary | ICD-10-CM | POA: Diagnosis not present

## 2021-06-02 LAB — CBC WITH DIFFERENTIAL/PLATELET
Basophils Absolute: 0 10*3/uL (ref 0.0–0.1)
Basophils Relative: 0.4 % (ref 0.0–3.0)
Eosinophils Absolute: 0.1 10*3/uL (ref 0.0–0.7)
Eosinophils Relative: 1.7 % (ref 0.0–5.0)
HCT: 42 % (ref 36.0–46.0)
Hemoglobin: 13.9 g/dL (ref 12.0–15.0)
Lymphocytes Relative: 18.6 % (ref 12.0–46.0)
Lymphs Abs: 0.8 10*3/uL (ref 0.7–4.0)
MCHC: 33.1 g/dL (ref 30.0–36.0)
MCV: 87 fl (ref 78.0–100.0)
Monocytes Absolute: 0.3 10*3/uL (ref 0.1–1.0)
Monocytes Relative: 6.3 % (ref 3.0–12.0)
Neutro Abs: 3.3 10*3/uL (ref 1.4–7.7)
Neutrophils Relative %: 73 % (ref 43.0–77.0)
Platelets: 140 10*3/uL — ABNORMAL LOW (ref 150.0–400.0)
RBC: 4.84 Mil/uL (ref 3.87–5.11)
RDW: 14.7 % (ref 11.5–15.5)
WBC: 4.5 10*3/uL (ref 4.0–10.5)

## 2021-06-02 LAB — TSH: TSH: 3.27 u[IU]/mL (ref 0.35–5.50)

## 2021-06-02 MED ORDER — CYANOCOBALAMIN 1000 MCG/ML IJ SOLN
1000.0000 ug | Freq: Once | INTRAMUSCULAR | Status: AC
  Administered 2021-06-02: 11:00:00 1000 ug via INTRAMUSCULAR

## 2021-06-02 MED ORDER — CHOLESTYRAMINE 4 G PO PACK: 4.0000 g | PACK | Freq: Every day | ORAL | 5 refills | Status: DC

## 2021-06-02 MED ORDER — ALPRAZOLAM 0.5 MG PO TABS: 0.5000 mg | ORAL_TABLET | Freq: Three times a day (TID) | ORAL | 1 refills | Status: DC | PRN

## 2021-06-02 MED ORDER — ESCITALOPRAM OXALATE 20 MG PO TABS: 20.0000 mg | ORAL_TABLET | Freq: Every day | ORAL | 1 refills | Status: DC

## 2021-06-02 NOTE — Patient Instructions (Signed)

## 2021-06-02 NOTE — Progress Notes (Signed)
Subjective:  Patient ID: [name], female    DOB: 1940-11-09  Age: 80 y.o. MRN: [id number]  CC: Hypothyroidism  This visit occurred during the SARS-CoV-2 public health emergency.  Safety protocols were in place, including screening questions prior to the visit, additional usage of staff PPE, and extensive cleaning of exam room while observing appropriate contact time as indicated for disinfecting solutions.    HPI [name] presents for f/up -  She fell in her home 3 weeks ago, landed on her left hip, had pain and bruising which has mostly resolved. Still has slight pain with weight -bearing activity. She controls the pain with tylenol and advil.  Outpatient Medications Prior to Visit  Medication Sig Dispense Refill   acetaminophen (TYLENOL) 500 MG tablet Take 500 mg by mouth every 6 (six) hours as needed.     brimonidine (ALPHAGAN) 0.15 % ophthalmic solution Place 1 drop into both eyes 3 (three) times daily.  3   cetirizine (ZYRTEC) 10 MG tablet Take 1 tablet (10 mg total) by mouth daily. 90 tablet 3   dicyclomine (BENTYL) 10 MG capsule Take 1 capsule (10 mg total) by mouth 4 (four) times daily -  before meals and at bedtime. 360 capsule 1   dorzolamide (TRUSOPT) 2 % ophthalmic solution Place 1 drop into both eyes 3 (three) times daily.  1   furosemide (LASIX) 20 MG tablet TAKE 1 TABLET BY MOUTH IN THE MORNING 90 tablet 0   levothyroxine (SYNTHROID) 25 MCG tablet TAKE 1 TABLET BY MOUTH ONCE DAILY BEFORE BREAKFAST 90 tablet 0   Lifitegrast (XIIDRA) 5 % SOLN Apply 2 drops to eye 2 (two) times daily.     ondansetron (ZOFRAN ODT) 4 MG disintegrating tablet Take 1 tablet (4 mg total) by mouth every 8 (eight) hours as needed for nausea or vomiting. 20 tablet 0   potassium chloride SA (KLOR-CON M) 20 MEQ tablet Take 1 tablet by mouth twice daily 180 tablet 0   Probiotic Product (ALIGN) 4 MG CAPS Take daily as directed on bottle 90 capsule 0   VASCEPA 1 g capsule Take 2 capsules (2 g  total) by mouth 2 (two) times daily. 360 capsule 0   ALPRAZolam (XANAX) 0.5 MG tablet Take 1 tablet (0.5 mg total) by mouth 3 (three) times daily as needed. for anxiety 270 tablet 1   cholestyramine (QUESTRAN) 4 g packet Take 1 packet (4 g total) by mouth daily. 30 packet 5   escitalopram (LEXAPRO) 20 MG tablet Take 1 tablet (20 mg total) by mouth at bedtime. 90 tablet 1   No facility-administered medications prior to visit.    ROS Review of Systems  Constitutional:  Positive for fatigue.  Eyes: Negative.   Respiratory:  Negative for cough, shortness of breath and wheezing.   Cardiovascular:  Negative for chest pain, palpitations and leg swelling.  Gastrointestinal:  Positive for diarrhea. Negative for abdominal pain, blood in stool, nausea and vomiting.  Endocrine: Negative.   Genitourinary:  Negative for difficulty urinating, dysuria and hematuria.  Musculoskeletal:  Positive for arthralgias. Negative for back pain and myalgias.  Skin: Negative.   Neurological:  Negative for dizziness, weakness and headaches.  Hematological:  Negative for adenopathy. Does not bruise/bleed easily.  Psychiatric/Behavioral:  Positive for dysphoric mood. Negative for confusion, sleep disturbance and suicidal ideas. The patient is nervous/anxious.    Objective:  BP 126/76 (BP Location: Right Arm, Patient Position: Sitting, Cuff Size: Large)    Pulse 68  Temp 98.2 F (36.8 C) (Oral)    Ht 5\' 3"  (1.6 m)    Wt 173 lb (78.5 kg)    SpO2 97%    BMI 30.65 kg/m   BP Readings from Last 3 Encounters:  06/02/21 126/76  06/01/21 118/82  01/25/21 140/80    Wt Readings from Last 3 Encounters:  06/02/21 173 lb (78.5 kg)  06/01/21 170 lb 12.8 oz (77.5 kg)  01/25/21 173 lb (78.5 kg)    Physical Exam Vitals reviewed.  Constitutional:      Appearance: Normal appearance. She is not ill-appearing.  HENT:     Mouth/Throat:     Mouth: Mucous membranes are moist.  Eyes:     General: No scleral icterus.     Conjunctiva/sclera: Conjunctivae normal.  Cardiovascular:     Rate and Rhythm: Normal rate and regular rhythm.     Heart sounds: No murmur heard. Pulmonary:     Effort: Pulmonary effort is normal.     Breath sounds: No stridor. No wheezing, rhonchi or rales.  Abdominal:     General: Abdomen is flat. Bowel sounds are normal. There is no distension.     Palpations: Abdomen is soft. There is no hepatomegaly, splenomegaly or mass.     Tenderness: There is no abdominal tenderness. There is no guarding.  Musculoskeletal:        General: Normal range of motion.     Cervical back: Neck supple.     Lumbar back: Normal. No bony tenderness. Normal range of motion. Negative right straight leg raise test and negative left straight leg raise test.     Right hip: Normal. No deformity, tenderness or bony tenderness. Normal range of motion.     Left hip: Normal. No deformity, tenderness or bony tenderness. Normal range of motion.     Right upper leg: Normal.     Left upper leg: Normal.     Right lower leg: No edema.     Left lower leg: No edema.  Lymphadenopathy:     Cervical: No cervical adenopathy.  Skin:    General: Skin is warm and dry.  Neurological:     General: No focal deficit present.     Mental Status: She is alert.  Psychiatric:        Mood and Affect: Mood normal.        Behavior: Behavior normal.    Lab Results  Component Value Date   WBC 4.5 06/02/2021   HGB 13.9 06/02/2021   HCT 42.0 06/02/2021   PLT 140.0 (L) 06/02/2021   GLUCOSE 85 01/25/2021   CHOL 206 (H) 10/29/2020   TRIG 135.0 10/29/2020   HDL 38.00 (L) 10/29/2020   LDLDIRECT 174.0 02/27/2019   LDLCALC 141 (H) 10/29/2020   ALT 26 01/25/2021   AST 29 01/25/2021   NA 139 01/25/2021   K 4.4 01/25/2021   CL 103 01/25/2021   CREATININE 0.78 01/25/2021   BUN 14 01/25/2021   CO2 30 01/25/2021   TSH 3.27 06/02/2021   INR 0.98 02/28/2017   HGBA1C 5.6 10/29/2020    CT Abdomen Pelvis Wo Contrast  Result Date:  01/25/2021 CLINICAL DATA:  Diffuse abdominal pain. Diverticulitis suspected. History of left renal lesion. EXAM: CT ABDOMEN AND PELVIS WITHOUT CONTRAST TECHNIQUE: Multidetector CT imaging of the abdomen and pelvis was performed following the standard protocol without IV contrast. COMPARISON:  MRI 11/26/2019.  CT 10/28/2019. FINDINGS: Lower chest: Lung bases are clear.  Small hiatal hernia is present. Hepatobiliary: Previous cholecystectomy.  No focal liver lesion. Pancreas: Normal Spleen: Normal Adrenals/Urinary Tract: Adrenal glands are normal. Kidneys appear normal without contrast. Small abnormality previously seen at the lower pole of the left kidney cannot be visualized in the absence of contrast. No evidence of stone or hydronephrosis. Bladder is normal except for mild prolapse. Stomach/Bowel: Small hiatal hernia as noted above. Stomach appears otherwise normal. No small bowel abnormality is seen. Normal appendix. There is mild diverticulosis of the left colon. Definite diverticulitis is not demonstrated. Low level diverticulitis can be inapparent at imaging. Vascular/Lymphatic: Aortic atherosclerosis. No aneurysm. IVC is normal. No adenopathy. Reproductive: Previous hysterectomy.  No pelvic mass. Other: No free fluid or air. Musculoskeletal: Chronic lower lumbar degenerative changes. IMPRESSION: No acute finding to explain the clinical presentation. The patient does have diverticulosis of the sigmoid colon region but there is no demonstrable diverticulitis. Small hiatal hernia. Previous cholecystectomy and hysterectomy. Aortic Atherosclerosis (ICD10-I70.0). Small abnormality previously evaluated at the lower pole of the left kidney is not visualized on this noncontrast exam. Electronically Signed   By: Nelson Chimes M.D.   On: 01/25/2021 15:34   DG HIP UNILAT WITH PELVIS 2-3 VIEWS LEFT  Result Date: 06/02/2021 CLINICAL DATA:  LEFT hip pain, fall EXAM: DG HIP (WITH OR WITHOUT PELVIS) 2-3V LEFT COMPARISON:   None. FINDINGS: Hips are located. No evidence of pelvic fracture or sacral fracture. Dedicated view of the LEFT hip demonstrates no femoral neck fracture. IMPRESSION: No fracture.  No dislocation Electronically Signed   By: Suzy Bouchard M.D.   On: 06/02/2021 10:04     Assessment & Plan:   [name] was seen today for hypothyroidism.  Diagnoses and all orders for this visit:  Hip injury, left, initial encounter- Based on her symptoms, exam, and plain films this is consistent with a contusion.  She will continue using over-the-counter remedies to control the pain. -     Cancel: DG Hip Unilat W OR W/O Pelvis Min 4 Views Left; Future  Primary hypertension- Her blood pressure is adequately well controlled. -     CBC with Differential/Platelet; Future -     TSH; Future -     TSH -     CBC with Differential/Platelet  Acquired hypothyroidism- Her TSH is in the normal range.  She will stay on the current dose of levothyroxine. -     TSH; Future -     TSH  Other vitamin B12 deficiency anemia -     CBC with Differential/Platelet; Future -     cyanocobalamin ((VITAMIN B-12)) injection 1,000 mcg -     CBC with Differential/Platelet  GAD (generalized anxiety disorder) -     ALPRAZolam (XANAX) 0.5 MG tablet; Take 1 tablet (0.5 mg total) by mouth 3 (three) times daily as needed. for anxiety -     escitalopram (LEXAPRO) 20 MG tablet; Take 1 tablet (20 mg total) by mouth at bedtime.  Diarrhea, unspecified type -     cholestyramine (QUESTRAN) 4 g packet; Take 1 packet (4 g total) by mouth daily.  Encounter for general adult medical examination with abnormal findings- Exam completed, labs reviewed, vaccines are up-to-date, no cancer screenings indicated, patient education was given.  I am having [name]. [name] maintain her brimonidine, dorzolamide, acetaminophen, cetirizine, Xiidra, dicyclomine, Align, ondansetron, Vascepa, furosemide, levothyroxine, potassium chloride SA, ALPRAZolam, escitalopram,  and cholestyramine. We administered cyanocobalamin.  Meds ordered this encounter  Medications   ALPRAZolam (XANAX) 0.5 MG tablet    Sig: Take 1 tablet (0.5 mg total) by  mouth 3 (three) times daily as needed. for anxiety    Dispense:  270 tablet    Refill:  1   escitalopram (LEXAPRO) 20 MG tablet    Sig: Take 1 tablet (20 mg total) by mouth at bedtime.    Dispense:  90 tablet    Refill:  1   cholestyramine (QUESTRAN) 4 g packet    Sig: Take 1 packet (4 g total) by mouth daily.    Dispense:  30 packet    Refill:  5   cyanocobalamin ((VITAMIN B-12)) injection 1,000 mcg     Follow-up: Return in about 6 months (around 12/01/2021).  Scarlette Calico, MD

## 2021-06-03 DIAGNOSIS — H401131 Primary open-angle glaucoma, bilateral, mild stage: Secondary | ICD-10-CM | POA: Diagnosis not present

## 2021-06-03 DIAGNOSIS — Z961 Presence of intraocular lens: Secondary | ICD-10-CM | POA: Diagnosis not present

## 2021-06-28 ENCOUNTER — Other Ambulatory Visit: Payer: Self-pay | Admitting: Family

## 2021-07-22 DIAGNOSIS — H04123 Dry eye syndrome of bilateral lacrimal glands: Secondary | ICD-10-CM | POA: Diagnosis not present

## 2021-07-22 DIAGNOSIS — H401131 Primary open-angle glaucoma, bilateral, mild stage: Secondary | ICD-10-CM | POA: Diagnosis not present

## 2021-07-30 ENCOUNTER — Other Ambulatory Visit: Payer: Self-pay | Admitting: Internal Medicine

## 2021-07-30 DIAGNOSIS — I10 Essential (primary) hypertension: Secondary | ICD-10-CM

## 2021-08-03 ENCOUNTER — Other Ambulatory Visit: Payer: Self-pay | Admitting: Internal Medicine

## 2021-08-30 ENCOUNTER — Encounter: Payer: Self-pay | Admitting: Gastroenterology

## 2021-09-01 ENCOUNTER — Telehealth: Payer: Self-pay

## 2021-09-01 NOTE — Telephone Encounter (Signed)
[employer] calling in ? ?Has additional questions they need to ask before approving patient med ? ?Would like cb from nurse [phone] ?

## 2021-09-01 NOTE — Telephone Encounter (Signed)
Key: [id number] ? ? ?

## 2021-09-02 NOTE — Telephone Encounter (Signed)
I have spoke to [facility] [employer] and provided additional info on other medications that has been tried. They have sent the PA to the clinical review team and will fax a determination once it has been made.  ?

## 2021-09-06 NOTE — Telephone Encounter (Signed)
Per CoverMyMeds PA is approved ?

## 2021-09-09 DIAGNOSIS — H401131 Primary open-angle glaucoma, bilateral, mild stage: Secondary | ICD-10-CM | POA: Diagnosis not present

## 2021-09-09 DIAGNOSIS — H04123 Dry eye syndrome of bilateral lacrimal glands: Secondary | ICD-10-CM | POA: Diagnosis not present

## 2021-10-13 ENCOUNTER — Encounter: Payer: Self-pay | Admitting: Internal Medicine

## 2021-10-13 ENCOUNTER — Ambulatory Visit (INDEPENDENT_AMBULATORY_CARE_PROVIDER_SITE_OTHER): Payer: PPO | Admitting: Family Medicine

## 2021-10-13 ENCOUNTER — Ambulatory Visit (INDEPENDENT_AMBULATORY_CARE_PROVIDER_SITE_OTHER): Payer: PPO | Admitting: Internal Medicine

## 2021-10-13 ENCOUNTER — Ambulatory Visit (INDEPENDENT_AMBULATORY_CARE_PROVIDER_SITE_OTHER): Payer: PPO

## 2021-10-13 ENCOUNTER — Encounter: Payer: Self-pay | Admitting: Family Medicine

## 2021-10-13 VITALS — BP 142/76 | HR 77 | Temp 98.2°F | Resp 16 | Ht 63.0 in | Wt 174.0 lb

## 2021-10-13 VITALS — BP 122/72 | HR 75 | Temp 97.6°F | Ht 63.0 in | Wt 173.0 lb

## 2021-10-13 DIAGNOSIS — R052 Subacute cough: Secondary | ICD-10-CM

## 2021-10-13 DIAGNOSIS — R059 Cough, unspecified: Secondary | ICD-10-CM

## 2021-10-13 DIAGNOSIS — J029 Acute pharyngitis, unspecified: Secondary | ICD-10-CM | POA: Diagnosis not present

## 2021-10-13 DIAGNOSIS — J069 Acute upper respiratory infection, unspecified: Secondary | ICD-10-CM | POA: Diagnosis not present

## 2021-10-13 DIAGNOSIS — I10 Essential (primary) hypertension: Secondary | ICD-10-CM | POA: Insufficient documentation

## 2021-10-13 DIAGNOSIS — J4531 Mild persistent asthma with (acute) exacerbation: Secondary | ICD-10-CM | POA: Diagnosis not present

## 2021-10-13 DIAGNOSIS — D696 Thrombocytopenia, unspecified: Secondary | ICD-10-CM | POA: Insufficient documentation

## 2021-10-13 DIAGNOSIS — R1013 Epigastric pain: Secondary | ICD-10-CM | POA: Insufficient documentation

## 2021-10-13 DIAGNOSIS — I639 Cerebral infarction, unspecified: Secondary | ICD-10-CM | POA: Diagnosis not present

## 2021-10-13 LAB — LIPASE: Lipase: 31 U/L (ref 11.0–59.0)

## 2021-10-13 LAB — HEPATIC FUNCTION PANEL
ALT: 19 U/L (ref 0–35)
AST: 25 U/L (ref 0–37)
Albumin: 4.4 g/dL (ref 3.5–5.2)
Alkaline Phosphatase: 118 U/L — ABNORMAL HIGH (ref 39–117)
Bilirubin, Direct: 0.1 mg/dL (ref 0.0–0.3)
Total Bilirubin: 0.6 mg/dL (ref 0.2–1.2)
Total Protein: 7.2 g/dL (ref 6.0–8.3)

## 2021-10-13 LAB — BASIC METABOLIC PANEL
BUN: 12 mg/dL (ref 6–23)
CO2: 30 mEq/L (ref 19–32)
Calcium: 9.4 mg/dL (ref 8.4–10.5)
Chloride: 105 mEq/L (ref 96–112)
Creatinine, Ser: 0.92 mg/dL (ref 0.40–1.20)
GFR: 58.74 mL/min — ABNORMAL LOW (ref >60.00)
Glucose, Bld: 101 mg/dL — ABNORMAL HIGH (ref 70–99)
Potassium: 4 mEq/L (ref 3.5–5.1)
Sodium: 142 mEq/L (ref 135–145)

## 2021-10-13 LAB — POC COVID19 BINAXNOW: SARS Coronavirus 2 Ag: NEGATIVE

## 2021-10-13 MED ORDER — METHYLPREDNISOLONE ACETATE 80 MG/ML IJ SUSP
120.0000 mg | Freq: Once | INTRAMUSCULAR | Status: AC
  Administered 2021-10-13: 120 mg via INTRAMUSCULAR

## 2021-10-13 MED ORDER — BENZONATATE 200 MG PO CAPS: 200.0000 mg | ORAL_CAPSULE | Freq: Two times a day (BID) | ORAL | 0 refills | Status: DC | PRN

## 2021-10-13 MED ORDER — HYDROCODONE BIT-HOMATROP MBR 5-1.5 MG/5ML PO SOLN: 5.0000 mL | Freq: Three times a day (TID) | ORAL | 0 refills | Status: DC | PRN

## 2021-10-13 NOTE — Progress Notes (Signed)
? ?Subjective:  ?Patient ID: [name], female    DOB: May 02, 1941  Age: 81 y.o. MRN: [id number] ? ?CC: Cough and Abdominal Pain ? ? ?HPI ?[name] presents for f/up - ? ?She complains of a 5-day history of nonproductive cough with wheezing, low-grade fever, chills, and laryngitis.  She complains of persistent epigastric abdominal pain and intermittent diarrhea.  She was seen by someone else earlier today and her COVID antigen test was negative. ? ?Outpatient Medications Prior to Visit  ?Medication Sig Dispense Refill  ? acetaminophen (TYLENOL) 500 MG tablet Take 500 mg by mouth every 6 (six) hours as needed.    ? ALPRAZolam (XANAX) 0.5 MG tablet Take 1 tablet (0.5 mg total) by mouth 3 (three) times daily as needed. for anxiety 270 tablet 1  ? benzonatate (TESSALON) 200 MG capsule Take 1 capsule (200 mg total) by mouth 2 (two) times daily as needed for cough. 20 capsule 0  ? brimonidine (ALPHAGAN) 0.15 % ophthalmic solution Place 1 drop into both eyes 3 (three) times daily.  3  ? cetirizine (ZYRTEC) 10 MG tablet Take 1 tablet (10 mg total) by mouth daily. 90 tablet 3  ? dicyclomine (BENTYL) 10 MG capsule Take 1 capsule (10 mg total) by mouth 4 (four) times daily -  before meals and at bedtime. 360 capsule 1  ? dorzolamide (TRUSOPT) 2 % ophthalmic solution Place 1 drop into both eyes 3 (three) times daily.  1  ? escitalopram (LEXAPRO) 20 MG tablet Take 1 tablet (20 mg total) by mouth at bedtime. 90 tablet 1  ? furosemide (LASIX) 20 MG tablet TAKE 1 TABLET BY MOUTH IN THE MORNING 90 tablet 0  ? levothyroxine (SYNTHROID) 25 MCG tablet TAKE 1 TABLET BY MOUTH ONCE DAILY BEFORE BREAKFAST 90 tablet 0  ? Lifitegrast (XIIDRA) 5 % SOLN Apply 2 drops to eye 2 (two) times daily.    ? ondansetron (ZOFRAN ODT) 4 MG disintegrating tablet Take 1 tablet (4 mg total) by mouth every 8 (eight) hours as needed for nausea or vomiting. 20 tablet 0  ? potassium chloride SA (KLOR-CON M) 20 MEQ tablet TAKE 1  BY MOUTH IN THE MORNING  AND 1 AT BEDTIME 264 tablet 0  ? Probiotic Product (ALIGN) 4 MG CAPS Take daily as directed on bottle 90 capsule 0  ? VASCEPA 1 g capsule Take 2 capsules (2 g total) by mouth 2 (two) times daily. 360 capsule 0  ? ?No facility-administered medications prior to visit.  ? ? ?ROS ?Review of Systems  ?Constitutional:  Positive for chills and fever. Negative for diaphoresis and fatigue.  ?HENT:  Positive for sore throat and voice change. Negative for trouble swallowing.   ?Respiratory:  Positive for cough and wheezing. Negative for chest tightness and shortness of breath.   ?Cardiovascular:  Negative for chest pain, palpitations and leg swelling.  ?Gastrointestinal:  Positive for abdominal pain and diarrhea. Negative for blood in stool, constipation, nausea and vomiting.  ?Genitourinary: Negative.  Negative for difficulty urinating.  ?Musculoskeletal: Negative.   ?Skin: Negative.   ?Neurological:  Negative for dizziness, weakness, light-headedness and headaches.  ?Hematological:  Negative for adenopathy. Does not bruise/bleed easily.  ?Psychiatric/Behavioral: Negative.    ? ?Objective:  ?BP (!) 142/76 (BP Location: Right Arm, Patient Position: Sitting, Cuff Size: Large)   Pulse 77   Temp 98.2 ?F (36.8 ?C) (Oral)   Resp 16   Ht '5\' 3"'$  (1.6 m)   Wt 174 lb (78.9 kg)   SpO2  94%   BMI 30.82 kg/m?  ? ?BP Readings from Last 3 Encounters:  ?10/13/21 (!) 142/76  ?10/13/21 122/72  ?06/02/21 126/76  ? ? ?Wt Readings from Last 3 Encounters:  ?10/13/21 174 lb (78.9 kg)  ?10/13/21 173 lb (78.5 kg)  ?06/02/21 173 lb (78.5 kg)  ? ? ?Physical Exam ?Vitals reviewed.  ?Constitutional:   ?   General: She is not in acute distress. ?   Appearance: She is not ill-appearing, toxic-appearing or diaphoretic.  ?HENT:  ?   Nose: Nose normal.  ?   Mouth/Throat:  ?   Mouth: Mucous membranes are moist.  ?Eyes:  ?   General: No scleral icterus. ?   Conjunctiva/sclera: Conjunctivae normal.  ?Cardiovascular:  ?   Rate and Rhythm: Normal rate and  regular rhythm.  ?   Heart sounds: No murmur heard. ?Pulmonary:  ?   Effort: Pulmonary effort is normal.  ?   Breath sounds: No stridor. No wheezing, rhonchi or rales.  ?Abdominal:  ?   General: Abdomen is flat.  ?   Palpations: There is no mass.  ?   Tenderness: There is no abdominal tenderness. There is no guarding.  ?   Hernia: No hernia is present.  ?Musculoskeletal:     ?   General: Normal range of motion.  ?   Cervical back: Neck supple.  ?   Right lower leg: No edema.  ?   Left lower leg: No edema.  ?Lymphadenopathy:  ?   Cervical: No cervical adenopathy.  ?Skin: ?   General: Skin is warm and dry.  ?   Findings: No rash.  ?Neurological:  ?   General: No focal deficit present.  ?   Mental Status: She is alert.  ?Psychiatric:     ?   Mood and Affect: Mood normal.     ?   Behavior: Behavior normal.  ? ? ?Lab Results  ?Component Value Date  ? WBC 4.5 06/02/2021  ? HGB 13.9 06/02/2021  ? HCT 42.0 06/02/2021  ? PLT 140.0 (L) 06/02/2021  ? GLUCOSE 101 (H) 10/13/2021  ? CHOL 206 (H) 10/29/2020  ? TRIG 135.0 10/29/2020  ? HDL 38.00 (L) 10/29/2020  ? LDLDIRECT 174.0 02/27/2019  ? LDLCALC 141 (H) 10/29/2020  ? ALT 19 10/13/2021  ? AST 25 10/13/2021  ? NA 142 10/13/2021  ? K 4.0 10/13/2021  ? CL 105 10/13/2021  ? CREATININE 0.92 10/13/2021  ? BUN 12 10/13/2021  ? CO2 30 10/13/2021  ? TSH 3.27 06/02/2021  ? INR 0.98 02/28/2017  ? HGBA1C 5.6 10/29/2020  ? ? ?CT Abdomen Pelvis Wo Contrast ? ?Result Date: 01/25/2021 ?CLINICAL DATA:  Diffuse abdominal pain. Diverticulitis suspected. History of left renal lesion. EXAM: CT ABDOMEN AND PELVIS WITHOUT CONTRAST TECHNIQUE: Multidetector CT imaging of the abdomen and pelvis was performed following the standard protocol without IV contrast. COMPARISON:  MRI 11/26/2019.  CT 10/28/2019. FINDINGS: Lower chest: Lung bases are clear.  Small hiatal hernia is present. Hepatobiliary: Previous cholecystectomy.  No focal liver lesion. Pancreas: Normal Spleen: Normal Adrenals/Urinary Tract:  Adrenal glands are normal. Kidneys appear normal without contrast. Small abnormality previously seen at the lower pole of the left kidney cannot be visualized in the absence of contrast. No evidence of stone or hydronephrosis. Bladder is normal except for mild prolapse. Stomach/Bowel: Small hiatal hernia as noted above. Stomach appears otherwise normal. No small bowel abnormality is seen. Normal appendix. There is mild diverticulosis of the left colon. Definite diverticulitis is not  demonstrated. Low level diverticulitis can be inapparent at imaging. Vascular/Lymphatic: Aortic atherosclerosis. No aneurysm. IVC is normal. No adenopathy. Reproductive: Previous hysterectomy.  No pelvic mass. Other: No free fluid or air. Musculoskeletal: Chronic lower lumbar degenerative changes. IMPRESSION: No acute finding to explain the clinical presentation. The patient does have diverticulosis of the sigmoid colon region but there is no demonstrable diverticulitis. Small hiatal hernia. Previous cholecystectomy and hysterectomy. Aortic Atherosclerosis (ICD10-I70.0). Small abnormality previously evaluated at the lower pole of the left kidney is not visualized on this noncontrast exam. Electronically Signed   By: Nelson Chimes M.D.   On: 01/25/2021 15:34  ? ?DG Chest 2 View ? ?Result Date: 10/13/2021 ?CLINICAL DATA:  Dry cough for 5 days, history coronary artery disease. Nonsmoker. History TIA, stroke, hypertension EXAM: CHEST - 2 VIEW COMPARISON:  07/10/2017 FINDINGS: Normal heart size, mediastinal contours, and pulmonary vascularity. Atherosclerotic calcification aorta. Minimal scarring at minor fissure and lingula stable. Lungs otherwise clear. No acute infiltrate, pleural effusion, or pneumothorax. Dextroconvex thoracic scoliosis. IMPRESSION: Minimal bibasilar scarring No acute abnormalities. Aortic Atherosclerosis (ICD10-I70.0). Electronically Signed   By: Lavonia Dana M.D.   On: 10/13/2021 15:41    ? ?Assessment & Plan:  ? ?[name]  was seen today for cough and abdominal pain. ? ?Diagnoses and all orders for this visit: ? ?Subacute cough- Shest x-ray is negative for mass or infiltrate.  Will treat for viral URI and asthma. ?-     DG Chest 2 V

## 2021-10-13 NOTE — Patient Instructions (Signed)

## 2021-10-13 NOTE — Patient Instructions (Signed)
You are negative for COVID today. ? ?Your symptoms appear to be related to a viral illness. ? ?I recommend that you treat your symptoms and stay hydrated. ? ?I prescribed Tessalon Perles for cough.  You can also try over-the-counter Mucinex for chest congestion and cough. ? ?Follow-up if you are getting significantly worse or if you are not improving over the next 4 to 5 days. ?

## 2021-10-13 NOTE — Progress Notes (Signed)
Subjective: ? [name] is a 81 y.o. female who presents for a 2 day history of post nasal drainage, cough and hoarseness.  States she also had a low-grade fever and chills but those have resolved.  States she took Tylenol. ?States she has been out of [employer] but normally takes this for cough. ? ?Denies dizziness, chest pain, palpitations, shortness of breath, vomiting or diarrhea. ? ?Lives alone. States her grand kids have been sick.  ? ?Hx of pneumonia ?Does not smoke.  ? ?She reports having several chronic health condition concerns and will schedule to follow-up with Dr. Ronnald Ramp regarding these. ? ?ROS as in subjective. ? ? ?Objective: ?Vitals:  ? 10/13/21 1320  ?BP: 122/72  ?Pulse: 75  ?Temp: 97.6 ?F (36.4 ?C)  ?SpO2: 98%  ? ? ?General appearance: Alert, WD/WN, no distress, well appearing ?                            Skin: warm, no rash ?                          Head: no sinus tenderness ?                           Eyes: conjunctiva normal, corneas clear, PERRLA ?                           Ears: pearly TMs, external ear canals normal ?                         Nose: septum midline, turbinates swollen, with erythema and clear discharge ?            Mouth/throat: MMM, tongue normal, mild pharyngeal erythema ?                          Neck: supple, no adenopathy, no thyromegaly, nontender ?                         Heart: RRR ?                        Lungs: CTA bilaterally, no wheezes, rales, or rhonchi ?    ? ?Assessment: ?Acute upper respiratory infection ? ?Cough, unspecified type - Plan: POC COVID-19, benzonatate (TESSALON) 200 MG capsule ? ?Sore throat - Plan: POC COVID-19 ? ?Acute pharyngitis, unspecified etiology ? ? ?Plan: ?COVID test negative ?Discussed diagnosis and treatment of presumed viral URI.  No red flag symptoms.  Tessalon refilled.  Suggested symptomatic OTC remedies. ?Nasal saline spray for congestion.  Tylenol as needed.  Call/return if significantly worsening or if symptoms aren't  resolving in 3 to 4 days. ?    ? ?

## 2021-11-01 ENCOUNTER — Other Ambulatory Visit: Payer: Self-pay | Admitting: Internal Medicine

## 2021-11-02 ENCOUNTER — Other Ambulatory Visit: Payer: Self-pay | Admitting: Internal Medicine

## 2021-11-02 DIAGNOSIS — H401131 Primary open-angle glaucoma, bilateral, mild stage: Secondary | ICD-10-CM | POA: Diagnosis not present

## 2021-11-02 DIAGNOSIS — I10 Essential (primary) hypertension: Secondary | ICD-10-CM

## 2021-11-18 ENCOUNTER — Ambulatory Visit: Payer: PPO | Admitting: Internal Medicine

## 2021-12-02 ENCOUNTER — Ambulatory Visit: Payer: PPO | Admitting: Podiatry

## 2021-12-02 DIAGNOSIS — B351 Tinea unguium: Secondary | ICD-10-CM

## 2021-12-02 NOTE — Patient Instructions (Signed)
I have ordered a medication for you that will come from [facility] in [place]. They should be calling you to verify insurance and will mail the medication to you. If you live close by then you can go by their pharmacy to pick up the medication. Their phone number is [phone]. If you do not hear from them in the next few days, please give us a call at [phone].   

## 2021-12-04 NOTE — Progress Notes (Signed)
Subjective:   Patient ID: [name], female   DOB: 81 y.o.   MRN: [id number]   HPI 81 year old female presents the office today for concerns of left third digit toenail becoming thick and discolored after she had a cyst removed from the toe.  She said it looked like the base of the nail was cut and bleed the other day.  Has not seen any drainage or bleeding.  No redness or swelling to the toenail sites.  No pain at the toenail.  No other concerns.   Review of Systems  All other systems reviewed and are negative.  Past Medical History:  Diagnosis Date   Anxiety state, unspecified    CAD (coronary artery disease)    Disorder of bone and cartilage, unspecified    Diverticulosis of colon (without mention of hemorrhage)    Dizziness and giddiness    Fatty liver    Fibromyalgia    Obesity, unspecified    Other and unspecified hyperlipidemia    Other chronic nonalcoholic liver disease    Skin cancer    basal cell on face   Stroke ([place])    TIA   TIA (transient ischemic attack)    Unspecified essential hypertension     Past Surgical History:  Procedure Laterality Date   BACK SURGERY     CHOLECYSTECTOMY     MOHS SURGERY     TONSILLECTOMY AND ADENOIDECTOMY     TOTAL ABDOMINAL HYSTERECTOMY W/ BILATERAL SALPINGOOPHORECTOMY       Current Outpatient Medications:    acetaminophen (TYLENOL) 500 MG tablet, Take 500 mg by mouth every 6 (six) hours as needed., Disp: , Rfl:    ALPRAZolam (XANAX) 0.5 MG tablet, Take 1 tablet (0.5 mg total) by mouth 3 (three) times daily as needed. for anxiety, Disp: 270 tablet, Rfl: 1   benzonatate (TESSALON) 200 MG capsule, Take 1 capsule (200 mg total) by mouth 2 (two) times daily as needed for cough., Disp: 20 capsule, Rfl: 0   brimonidine (ALPHAGAN) 0.15 % ophthalmic solution, Place 1 drop into both eyes 3 (three) times daily., Disp: , Rfl: 3   cetirizine (ZYRTEC) 10 MG tablet, Take 1 tablet (10 mg total) by mouth daily., Disp: 90 tablet, Rfl: 3    dicyclomine (BENTYL) 10 MG capsule, Take 1 capsule (10 mg total) by mouth 4 (four) times daily -  before meals and at bedtime., Disp: 360 capsule, Rfl: 1   dorzolamide (TRUSOPT) 2 % ophthalmic solution, Place 1 drop into both eyes 3 (three) times daily., Disp: , Rfl: 1   escitalopram (LEXAPRO) 20 MG tablet, Take 1 tablet (20 mg total) by mouth at bedtime., Disp: 90 tablet, Rfl: 1   furosemide (LASIX) 20 MG tablet, TAKE 1 TABLET BY MOUTH IN THE MORNING, Disp: 90 tablet, Rfl: 0   HYDROcodone bit-homatropine (HYCODAN) 5-1.5 MG/5ML syrup, Take 5 mLs by mouth every 8 (eight) hours as needed for cough., Disp: 120 mL, Rfl: 0   levothyroxine (SYNTHROID) 25 MCG tablet, TAKE 1 TABLET BY MOUTH ONCE DAILY BEFORE BREAKFAST, Disp: 90 tablet, Rfl: 0   Lifitegrast (XIIDRA) 5 % SOLN, Apply 2 drops to eye 2 (two) times daily., Disp: , Rfl:    ondansetron (ZOFRAN ODT) 4 MG disintegrating tablet, Take 1 tablet (4 mg total) by mouth every 8 (eight) hours as needed for nausea or vomiting., Disp: 20 tablet, Rfl: 0   potassium chloride SA (KLOR-CON M) 20 MEQ tablet, TAKE 1  BY MOUTH IN THE MORNING AND 1 AT  BEDTIME, Disp: 264 tablet, Rfl: 0   Probiotic Product (ALIGN) 4 MG CAPS, Take daily as directed on bottle, Disp: 90 capsule, Rfl: 0   VASCEPA 1 g capsule, Take 2 capsules (2 g total) by mouth 2 (two) times daily., Disp: 360 capsule, Rfl: 0  Allergies  Allergen Reactions   Diltiazem Hcl Other (See Comments)    REACTION: pt states it made her dizzy...   Statins     Muscle aches   Zetia [Ezetimibe]     "it made me feel bad"   Codeine Nausea Only   Sulfonamide Derivatives Hives, Itching and Rash   Tape Itching   Tramadol Itching          Objective:  Physical Exam  General: AAO x3, NAD  Dermatological: Left third toenail is hypertrophic, dystrophic with yellow, brown discoloration.  There was some dried blood along the base of the nail and there is dystrophy present.  It appears that there are some scar  tissue in the proximal nail fold.  There is no edema, erythema, drainage or pus or any signs of infection.  Vascular: Dorsalis Pedis artery and Posterior Tibial artery pedal pulses are 2/4 bilateral with immedate capillary fill time. There is no pain with calf compression, swelling, warmth, erythema.   Neruologic: Grossly intact via light touch bilateral.   Musculoskeletal: No pain on exam.  Muscular strength 5/5 in all groups tested bilateral.  Gait: Unassisted, Nonantalgic.       Assessment:   Left third toenail dystrophy, onychomycosis     Plan:  -Treatment options discussed including all alternatives, risks, and complications -Etiology of symptoms were discussed -We discussed nail removal but ultimately decided to hold off on this as does not cause any pain or signs of infection.  Is able to sharply debride the nail as well as file it down with any complications or bleeding.  I have ordered a compound cream through [facility] to help with the dystrophy, nail fungus.  She was to start this treatment if no improvement will consider nail removal versus other options.  Trula Slade DPM

## 2021-12-27 ENCOUNTER — Other Ambulatory Visit: Payer: Self-pay | Admitting: Internal Medicine

## 2021-12-27 ENCOUNTER — Telehealth: Payer: Self-pay | Admitting: Internal Medicine

## 2021-12-27 DIAGNOSIS — F411 Generalized anxiety disorder: Secondary | ICD-10-CM

## 2021-12-27 DIAGNOSIS — K582 Mixed irritable bowel syndrome: Secondary | ICD-10-CM

## 2021-12-27 DIAGNOSIS — I10 Essential (primary) hypertension: Secondary | ICD-10-CM

## 2021-12-27 MED ORDER — ALPRAZOLAM 0.5 MG PO TABS: 0.5000 mg | ORAL_TABLET | Freq: Three times a day (TID) | ORAL | 1 refills | Status: DC | PRN

## 2021-12-27 MED ORDER — DICYCLOMINE HCL 10 MG PO CAPS: 10.0000 mg | ORAL_CAPSULE | Freq: Three times a day (TID) | ORAL | 1 refills | Status: DC

## 2021-12-27 MED ORDER — ESCITALOPRAM OXALATE 20 MG PO TABS: 20.0000 mg | ORAL_TABLET | Freq: Every day | ORAL | 1 refills | Status: DC

## 2021-12-27 MED ORDER — POTASSIUM CHLORIDE CRYS ER 20 MEQ PO TBCR: EXTENDED_RELEASE_TABLET | ORAL | 0 refills | Status: DC

## 2021-12-27 NOTE — Telephone Encounter (Signed)
Caller & Relationship to patient: [name]  Call back number: [phone]  Date of last office visit: 10/13/21  Date of next office visit: 01/13/22  Medication(s) to be refilled:  ALPRAZolam (XANAX) 0.5 MG tablet  dicyclomine (BENTYL) 10 MG capsule  escitalopram (LEXAPRO) 20 MG tablet  potassium chloride SA (KLOR-CON M) 20 MEQ tablet    Preferred Pharmacy:  [facility], [place] Phone:  [phone]  Fax:  [phone]

## 2021-12-28 ENCOUNTER — Other Ambulatory Visit: Payer: Self-pay | Admitting: Internal Medicine

## 2021-12-29 ENCOUNTER — Other Ambulatory Visit: Payer: Self-pay | Admitting: Family

## 2022-01-13 ENCOUNTER — Encounter: Payer: Self-pay | Admitting: Internal Medicine

## 2022-01-13 ENCOUNTER — Ambulatory Visit (INDEPENDENT_AMBULATORY_CARE_PROVIDER_SITE_OTHER): Payer: PPO | Admitting: Internal Medicine

## 2022-01-13 VITALS — BP 138/72 | HR 71 | Temp 98.0°F | Ht 63.0 in | Wt 175.0 lb

## 2022-01-13 DIAGNOSIS — D696 Thrombocytopenia, unspecified: Secondary | ICD-10-CM | POA: Diagnosis not present

## 2022-01-13 DIAGNOSIS — D518 Other vitamin B12 deficiency anemias: Secondary | ICD-10-CM | POA: Diagnosis not present

## 2022-01-13 DIAGNOSIS — R0789 Other chest pain: Secondary | ICD-10-CM | POA: Insufficient documentation

## 2022-01-13 DIAGNOSIS — E039 Hypothyroidism, unspecified: Secondary | ICD-10-CM

## 2022-01-13 DIAGNOSIS — E785 Hyperlipidemia, unspecified: Secondary | ICD-10-CM

## 2022-01-13 DIAGNOSIS — I10 Essential (primary) hypertension: Secondary | ICD-10-CM

## 2022-01-13 DIAGNOSIS — E781 Pure hyperglyceridemia: Secondary | ICD-10-CM | POA: Diagnosis not present

## 2022-01-13 DIAGNOSIS — I2510 Atherosclerotic heart disease of native coronary artery without angina pectoris: Secondary | ICD-10-CM

## 2022-01-13 LAB — BRAIN NATRIURETIC PEPTIDE: Pro B Natriuretic peptide (BNP): 69 pg/mL (ref 0.0–100.0)

## 2022-01-13 LAB — CBC WITH DIFFERENTIAL/PLATELET
Basophils Absolute: 0 10*3/uL (ref 0.0–0.1)
Basophils Relative: 0.5 % (ref 0.0–3.0)
Eosinophils Absolute: 0.1 10*3/uL (ref 0.0–0.7)
Eosinophils Relative: 2.4 % (ref 0.0–5.0)
HCT: 41.2 % (ref 36.0–46.0)
Hemoglobin: 13.6 g/dL (ref 12.0–15.0)
Lymphocytes Relative: 17.2 % (ref 12.0–46.0)
Lymphs Abs: 0.8 10*3/uL (ref 0.7–4.0)
MCHC: 33 g/dL (ref 30.0–36.0)
MCV: 89.4 fl (ref 78.0–100.0)
Monocytes Absolute: 0.3 10*3/uL (ref 0.1–1.0)
Monocytes Relative: 6.4 % (ref 3.0–12.0)
Neutro Abs: 3.3 10*3/uL (ref 1.4–7.7)
Neutrophils Relative %: 73.5 % (ref 43.0–77.0)
Platelets: 140 10*3/uL — ABNORMAL LOW (ref 150.0–400.0)
RBC: 4.6 Mil/uL (ref 3.87–5.11)
RDW: 14.2 % (ref 11.5–15.5)
WBC: 4.5 10*3/uL (ref 4.0–10.5)

## 2022-01-13 LAB — LIPID PANEL
Cholesterol: 233 mg/dL — ABNORMAL HIGH (ref 0–200)
HDL: 42.6 mg/dL (ref >39.00)
LDL Cholesterol: 168 mg/dL — ABNORMAL HIGH (ref 0–99)
NonHDL: 190.45
Total CHOL/HDL Ratio: 5
Triglycerides: 112 mg/dL (ref 0.0–149.0)
VLDL: 22.4 mg/dL (ref 0.0–40.0)

## 2022-01-13 LAB — D-DIMER, QUANTITATIVE: D-Dimer, Quant: 0.72 mcg/mL FEU — ABNORMAL HIGH (ref <0.50)

## 2022-01-13 LAB — TROPONIN I (HIGH SENSITIVITY): High Sens Troponin I: 5 ng/L (ref 2–17)

## 2022-01-13 LAB — FOLATE: Folate: >24.2 ng/mL (ref >5.9)

## 2022-01-13 LAB — TSH: TSH: 2.07 u[IU]/mL (ref 0.35–5.50)

## 2022-01-13 MED ORDER — REPATHA 140 MG/ML ~~LOC~~ SOSY: 1.0000 | PREFILLED_SYRINGE | SUBCUTANEOUS | 1 refills | Status: DC

## 2022-01-13 MED ORDER — CYANOCOBALAMIN 1000 MCG/ML IJ SOLN
1000.0000 ug | Freq: Once | INTRAMUSCULAR | Status: AC
  Administered 2022-01-13: 1000 ug via INTRAMUSCULAR

## 2022-01-13 NOTE — Progress Notes (Signed)
Subjective:  Patient ID: [name], female    DOB: Jan 22, 1941  Age: 81 y.o. MRN: [id number]  CC: Chest Pain   HPI [name] presents for f/up -  She complains of a 42-monthhistory of fatigue.  1 week ago she had chest pain and shortness of breath.  The pain occurred at rest and radiated into her jaw.  She describes the pain as a tightness.  Outpatient Medications Prior to Visit  Medication Sig Dispense Refill   acetaminophen (TYLENOL) 500 MG tablet Take 500 mg by mouth every 6 (six) hours as needed.     ALPRAZolam (XANAX) 0.5 MG tablet Take 1 tablet (0.5 mg total) by mouth 3 (three) times daily as needed. for anxiety 270 tablet 1   brimonidine (ALPHAGAN) 0.15 % ophthalmic solution Place 1 drop into both eyes 3 (three) times daily.  3   cetirizine (ZYRTEC) 10 MG tablet Take 1 tablet (10 mg total) by mouth daily. 90 tablet 3   dicyclomine (BENTYL) 10 MG capsule Take 1 capsule (10 mg total) by mouth 4 (four) times daily -  before meals and at bedtime. 360 capsule 1   dorzolamide (TRUSOPT) 2 % ophthalmic solution Place 1 drop into both eyes 3 (three) times daily.  1   escitalopram (LEXAPRO) 20 MG tablet Take 1 tablet (20 mg total) by mouth at bedtime. 90 tablet 1   furosemide (LASIX) 20 MG tablet TAKE 1 TABLET BY MOUTH IN THE MORNING 90 tablet 0   levothyroxine (SYNTHROID) 25 MCG tablet TAKE 1 TABLET BY MOUTH ONCE DAILY BEFORE BREAKFAST 90 tablet 0   Lifitegrast (XIIDRA) 5 % SOLN Apply 2 drops to eye 2 (two) times daily.     potassium chloride SA (KLOR-CON M) 20 MEQ tablet TAKE 1  BY MOUTH IN THE MORNING AND 1 AT BEDTIME 264 tablet 0   Probiotic Product (ALIGN) 4 MG CAPS Take daily as directed on bottle 90 capsule 0   VASCEPA 1 g capsule TAKE 2 CAPSULES BY MOUTH IN THE MORNING AND 2 AT BEDTIME 360 capsule 0   benzonatate (TESSALON) 200 MG capsule Take 1 capsule (200 mg total) by mouth 2 (two) times daily as needed for cough. 20 capsule 0   HYDROcodone bit-homatropine (HYCODAN)  5-1.5 MG/5ML syrup Take 5 mLs by mouth every 8 (eight) hours as needed for cough. 120 mL 0   ondansetron (ZOFRAN ODT) 4 MG disintegrating tablet Take 1 tablet (4 mg total) by mouth every 8 (eight) hours as needed for nausea or vomiting. 20 tablet 0   No facility-administered medications prior to visit.    ROS Review of Systems  Constitutional:  Positive for fatigue. Negative for chills, diaphoresis and unexpected weight change.  HENT: Negative.    Eyes: Negative.   Respiratory:  Positive for chest tightness and shortness of breath. Negative for cough and wheezing.   Cardiovascular:  Negative for chest pain, palpitations and leg swelling.  Gastrointestinal:  Negative for abdominal pain, diarrhea, nausea and vomiting.  Endocrine: Negative.   Genitourinary: Negative.  Negative for difficulty urinating and dysuria.  Musculoskeletal: Negative.  Negative for myalgias.  Skin: Negative.   Neurological:  Negative for dizziness, weakness, light-headedness and headaches.  Hematological:  Negative for adenopathy. Does not bruise/bleed easily.  Psychiatric/Behavioral: Negative.      Objective:  BP 138/72 (BP Location: Right Arm, Patient Position: Sitting, Cuff Size: Large)   Pulse 71   Temp 98 F (36.7 C) (Oral)   Ht '5\' 3"'$  (1.6  m)   Wt 175 lb (79.4 kg)   SpO2 95%   BMI 31.00 kg/m   BP Readings from Last 3 Encounters:  01/13/22 138/72  10/13/21 (!) 142/76  10/13/21 122/72    Wt Readings from Last 3 Encounters:  01/13/22 175 lb (79.4 kg)  10/13/21 174 lb (78.9 kg)  10/13/21 173 lb (78.5 kg)    Physical Exam Vitals reviewed.  Constitutional:      Appearance: She is not ill-appearing.  HENT:     Mouth/Throat:     Mouth: Mucous membranes are moist.  Eyes:     General: No scleral icterus.    Conjunctiva/sclera: Conjunctivae normal.  Cardiovascular:     Rate and Rhythm: Normal rate and regular rhythm.     Heart sounds: No murmur heard.    No friction rub. No gallop.      Comments: EKG-  SR with PVC's, 69 bpm Anterior infract pattern is old No LVH or acute ST/T wave changes Pulmonary:     Effort: Pulmonary effort is normal.     Breath sounds: No stridor. No wheezing, rhonchi or rales.  Abdominal:     General: Abdomen is flat.     Palpations: There is no mass.     Tenderness: There is no abdominal tenderness. There is no guarding.     Hernia: No hernia is present.  Musculoskeletal:     Cervical back: Neck supple.     Right lower leg: No edema.     Left lower leg: No edema.  Lymphadenopathy:     Cervical: No cervical adenopathy.  Skin:    General: Skin is warm and dry.  Neurological:     General: No focal deficit present.  Psychiatric:        Mood and Affect: Mood normal.        Behavior: Behavior normal.     Lab Results  Component Value Date   WBC 4.5 01/13/2022   HGB 13.6 01/13/2022   HCT 41.2 01/13/2022   PLT 140.0 (L) 01/13/2022   GLUCOSE 101 (H) 10/13/2021   CHOL 233 (H) 01/13/2022   TRIG 112.0 01/13/2022   HDL 42.60 01/13/2022   LDLDIRECT 174.0 02/27/2019   LDLCALC 168 (H) 01/13/2022   ALT 19 10/13/2021   AST 25 10/13/2021   NA 142 10/13/2021   K 4.0 10/13/2021   CL 105 10/13/2021   CREATININE 0.92 10/13/2021   BUN 12 10/13/2021   CO2 30 10/13/2021   TSH 2.07 01/13/2022   INR 0.98 02/28/2017   HGBA1C 5.6 10/29/2020    CT Abdomen Pelvis Wo Contrast  Result Date: 01/25/2021 CLINICAL DATA:  Diffuse abdominal pain. Diverticulitis suspected. History of left renal lesion. EXAM: CT ABDOMEN AND PELVIS WITHOUT CONTRAST TECHNIQUE: Multidetector CT imaging of the abdomen and pelvis was performed following the standard protocol without IV contrast. COMPARISON:  MRI 11/26/2019.  CT 10/28/2019. FINDINGS: Lower chest: Lung bases are clear.  Small hiatal hernia is present. Hepatobiliary: Previous cholecystectomy.  No focal liver lesion. Pancreas: Normal Spleen: Normal Adrenals/Urinary Tract: Adrenal glands are normal. Kidneys appear normal  without contrast. Small abnormality previously seen at the lower pole of the left kidney cannot be visualized in the absence of contrast. No evidence of stone or hydronephrosis. Bladder is normal except for mild prolapse. Stomach/Bowel: Small hiatal hernia as noted above. Stomach appears otherwise normal. No small bowel abnormality is seen. Normal appendix. There is mild diverticulosis of the left colon. Definite diverticulitis is not demonstrated. Low level diverticulitis can  be inapparent at imaging. Vascular/Lymphatic: Aortic atherosclerosis. No aneurysm. IVC is normal. No adenopathy. Reproductive: Previous hysterectomy.  No pelvic mass. Other: No free fluid or air. Musculoskeletal: Chronic lower lumbar degenerative changes. IMPRESSION: No acute finding to explain the clinical presentation. The patient does have diverticulosis of the sigmoid colon region but there is no demonstrable diverticulitis. Small hiatal hernia. Previous cholecystectomy and hysterectomy. Aortic Atherosclerosis (ICD10-I70.0). Small abnormality previously evaluated at the lower pole of the left kidney is not visualized on this noncontrast exam. Electronically Signed   By: Nelson Chimes M.D.   On: 01/25/2021 15:34    Assessment & Plan:   [name] was seen today for chest pain.  Diagnoses and all orders for this visit:  Essential hypertension- Her blood pressure is well controlled. -     EKG 12-Lead  Acquired hypothyroidism- She is euthyroid. -     TSH; Future -     TSH  Thrombocytopenia ([place])- Will restart B12 replacement therapy. -     CBC with Differential/Platelet; Future -     Folate; Future -     Folate -     CBC with Differential/Platelet  Pure hypertriglyceridemia- Her triglycerides are normal now.  Other vitamin B12 deficiency anemia -     CBC with Differential/Platelet; Future -     Folate; Future -     Folate -     CBC with Differential/Platelet -     cyanocobalamin (VITAMIN B12) injection 1,000  mcg  Hyperlipidemia with target LDL less than 100- She will not take a statin but has not reached her LDL goal.  I recommended that she start using a PCSK9 inhibitor. -     Lipid panel; Future -     Lipid panel -     Evolocumab (REPATHA) 140 MG/ML SOSY; Inject 1 Act into the skin every 14 (fourteen) days.  Chest tightness- Labs and EKG are reassuring but her symptoms are suspicious for ischemia.  I recommended that she undergo a myocardial perfusion imaging. -     EKG 12-Lead -     Troponin I (High Sensitivity); Future -     Brain natriuretic peptide; Future -     D-dimer, quantitative; Future -     D-dimer, quantitative -     Brain natriuretic peptide -     Troponin I (High Sensitivity) -     MYOCARDIAL PERFUSION IMAGING; Future -     Cardiac Stress Test: Informed Consent Details: Physician/Practitioner Attestation; Transcribe to consent form and obtain patient signature; Future  Coronary artery disease involving native coronary artery of native heart without angina pectoris -     MYOCARDIAL PERFUSION IMAGING; Future -     Evolocumab (REPATHA) 140 MG/ML SOSY; Inject 1 Act into the skin every 14 (fourteen) days. -     Cardiac Stress Test: Informed Consent Details: Physician/Practitioner Attestation; Transcribe to consent form and obtain patient signature; Future   I have discontinued [employer]'s ondansetron, benzonatate, and HYDROcodone bit-homatropine. I am also having her start on Repatha. Additionally, I am having her maintain her brimonidine, dorzolamide, acetaminophen, cetirizine, Xiidra, Align, levothyroxine, furosemide, dicyclomine, ALPRAZolam, escitalopram, potassium chloride SA, and Vascepa. We administered cyanocobalamin.  Meds ordered this encounter  Medications   cyanocobalamin (VITAMIN B12) injection 1,000 mcg   Evolocumab (REPATHA) 140 MG/ML SOSY    Sig: Inject 1 Act into the skin every 14 (fourteen) days.    Dispense:  6.3 mL    Refill:  1   I spent 50 minutes  in preparing to see the patient by review of recent labs and imaging, obtaining and reviewing separately obtained history, communicating with the patient, ordering medications and procedures, and documenting clinical information in the EHR including the differential Dx, treatment, and any further evaluation and management of multiple complex medical issues.     Follow-up: Return in about 3 weeks (around 02/03/2022).  Tanya Calico, MD

## 2022-01-13 NOTE — Patient Instructions (Signed)
Nonspecific Chest Pain, Adult Chest pain is an uncomfortable, tight, or painful feeling in the chest. The pain can feel like a crushing, aching, or squeezing pressure. A person can feel a burning or tingling sensation. Chest pain can also be felt in your back, neck, jaw, shoulder, or arm. This pain can be worse when you move, sneeze, or take a deep breath. Chest pain can be caused by a condition that is life-threatening. This must be treated right away. It can also be caused by something that is not life-threatening. If you have chest pain, it can be hard to know the difference, so it is important to get help right away to make sure that you do not have a serious condition. Some life-threatening causes of chest pain include: Heart attack. A tear in the body's main blood vessel (aortic dissection). Inflammation around your heart (pericarditis). A problem in the lungs, such as a blood clot (pulmonary embolism) or a collapsed lung (pneumothorax). Some non life-threatening causes of chest pain include: Heartburn. Anxiety or stress. Damage to the bones, muscles, and cartilage that make up your chest wall. Pneumonia or bronchitis. Shingles infection (varicella-zoster virus). Your chest pain may come and go. It may also be constant. Your health care provider will do tests and other studies to find the cause of your pain. Treatment will depend on the cause of your chest pain. Follow these instructions at home: Medicines Take over-the-counter and prescription medicines only as told by your health care provider. If you were prescribed an antibiotic medicine, take it as told by your health care provider. Do not stop taking the antibiotic even if you start to feel better. Activity Avoid any activities that cause chest pain. Do not lift anything that is heavier than 10 lb (4.5 kg), or the limit that you are told, until your health care provider says that it is safe. Rest as directed by your health care  provider. Return to your normal activities only as told by your health care provider. Ask your health care provider what activities are safe for you. Lifestyle     Do not use any products that contain nicotine or tobacco, such as cigarettes, e-cigarettes, and chewing tobacco. If you need help quitting, ask your health care provider. Do not drink alcohol. Make healthy lifestyle changes as recommended. These may include: Getting regular exercise. Ask your health care provider to suggest some exercises that are safe for you. Eating a heart-healthy diet. This includes plenty of fresh fruits and vegetables, whole grains, low-fat (lean) protein, and low-fat dairy products. A dietitian can help you find healthy eating options. Maintaining a healthy weight. Managing any other health conditions you may have, such as high blood pressure (hypertension) or diabetes. Reducing stress, such as with yoga or relaxation techniques. General instructions Pay attention to any changes in your symptoms. It is up to you to get the results of any tests that were done. Ask your health care provider, or the department that is doing the tests, when your results will be ready. Keep all follow-up visits as told by your health care provider. This is important. You may be asked to go for further testing if your chest pain does not go away. Contact a health care provider if: Your chest pain does not go away. You feel depressed. You have a fever. You notice changes in your symptoms or develop new symptoms. Get help right away if: Your chest pain gets worse. You have a cough that gets worse, or you   cough up blood. You have severe pain in your abdomen. You faint. You have sudden, unexplained chest discomfort. You have sudden, unexplained discomfort in your arms, back, neck, or jaw. You have shortness of breath at any time. You suddenly start to sweat, or your skin gets clammy. You feel nausea or you vomit. You  suddenly feel lightheaded or dizzy. You have severe weakness, or unexplained weakness or fatigue. Your heart begins to beat quickly, or it feels like it is skipping beats. These symptoms may represent a serious problem that is an emergency. Do not wait to see if the symptoms will go away. Get medical help right away. Call your local emergency services (911 in the [place] Do not drive yourself to the hospital. Summary Chest pain can be caused by a condition that is serious and requires urgent treatment. It may also be caused by something that is not life-threatening. Your health care provider may do lab tests and other studies to find the cause of your pain. Follow your health care provider's instructions on taking medicines, making lifestyle changes, and getting emergency treatment if symptoms become worse. Keep all follow-up visits as told by your health care provider. This includes visits for any further testing if your chest pain does not go away. This information is not intended to replace advice given to you by your health care provider. Make sure you discuss any questions you have with your health care provider. Document Revised: 08/20/2020 Document Reviewed: 08/20/2020 Elsevier Patient Education  2023 [employer]  

## 2022-01-13 NOTE — Progress Notes (Signed)
Patient here in office for her b21 injection. Given in right deltoid. Patient tolerated well

## 2022-01-14 ENCOUNTER — Telehealth: Payer: Self-pay

## 2022-01-14 ENCOUNTER — Encounter (HOSPITAL_COMMUNITY): Payer: Self-pay | Admitting: Internal Medicine

## 2022-01-14 ENCOUNTER — Telehealth (HOSPITAL_COMMUNITY): Payer: Self-pay | Admitting: *Deleted

## 2022-01-14 NOTE — Telephone Encounter (Signed)
Close encounter 

## 2022-01-14 NOTE — Telephone Encounter (Signed)
Key: [id number]

## 2022-01-17 ENCOUNTER — Telehealth: Payer: Self-pay | Admitting: Cardiology

## 2022-01-17 ENCOUNTER — Other Ambulatory Visit: Payer: Self-pay | Admitting: Internal Medicine

## 2022-01-17 DIAGNOSIS — I2510 Atherosclerotic heart disease of native coronary artery without angina pectoris: Secondary | ICD-10-CM

## 2022-01-17 DIAGNOSIS — E785 Hyperlipidemia, unspecified: Secondary | ICD-10-CM

## 2022-01-17 NOTE — Telephone Encounter (Signed)
Patient calling with question bout procedure. Please advise

## 2022-01-17 NOTE — Telephone Encounter (Signed)
Per CoverMyMeds: ? ?PA was denied.  ?

## 2022-01-17 NOTE — Telephone Encounter (Signed)
Pt called back, and stated she does not have any questions regarding the Myocardial Perfusion Imaging study tomorrow 01/18/22 at 745 am, but her daughter does.   Pt gave Ms. Rachael Fee number to call.    Called daughter, no answer, no voicemail box.  Pt understands what day / time to arrive at hospital for testing.

## 2022-01-17 NOTE — Telephone Encounter (Signed)
Pt called back with questions x 2 for testing tomorrow.  I answered all of her questions, and gave her the telephone number to Imaging to find out how much the testing costs.    No follow up required at this time.

## 2022-01-18 ENCOUNTER — Ambulatory Visit (HOSPITAL_COMMUNITY)
Admission: RE | Admit: 2022-01-18 | Discharge: 2022-01-18 | Disposition: A | Discharge status: Home / Self Care | Payer: PPO | Source: Ambulatory Visit | Attending: Cardiology | Admitting: Cardiology

## 2022-01-18 DIAGNOSIS — I2510 Atherosclerotic heart disease of native coronary artery without angina pectoris: Secondary | ICD-10-CM | POA: Diagnosis not present

## 2022-01-18 DIAGNOSIS — R0789 Other chest pain: Secondary | ICD-10-CM | POA: Insufficient documentation

## 2022-01-18 LAB — MYOCARDIAL PERFUSION IMAGING
LV dias vol: 41 mL (ref 46–106)
LV sys vol: 86 mL
Nuc Stress EF: 52 %
Peak HR: 77 {beats}/min
Rest HR: 55 {beats}/min
Rest Nuclear Isotope Dose: 10.9 mCi
SDS: 5
SRS: 0
SSS: 5
ST Depression (mm): 0 mm
Stress Nuclear Isotope Dose: 32.2 mCi
TID: 1.28

## 2022-01-18 MED ORDER — TECHNETIUM TC 99M TETROFOSMIN IV KIT
32.2000 | PACK | Freq: Once | INTRAVENOUS | Status: AC | PRN
  Administered 2022-01-18: 32.2 via INTRAVENOUS

## 2022-01-18 MED ORDER — TECHNETIUM TC 99M TETROFOSMIN IV KIT
10.9000 | PACK | Freq: Once | INTRAVENOUS | Status: AC | PRN
  Administered 2022-01-18: 10.9 via INTRAVENOUS

## 2022-01-18 MED ORDER — AMINOPHYLLINE 25 MG/ML IV SOLN
75.0000 mg | Freq: Once | INTRAVENOUS | Status: AC
  Administered 2022-01-18: 75 mg via INTRAVENOUS

## 2022-01-18 MED ORDER — REGADENOSON 0.4 MG/5ML IV SOLN
0.4000 mg | Freq: Once | INTRAVENOUS | Status: AC
  Administered 2022-01-18: 0.4 mg via INTRAVENOUS

## 2022-01-19 ENCOUNTER — Encounter: Payer: Self-pay | Admitting: Internal Medicine

## 2022-01-31 ENCOUNTER — Other Ambulatory Visit: Payer: Self-pay | Admitting: Internal Medicine

## 2022-01-31 DIAGNOSIS — I10 Essential (primary) hypertension: Secondary | ICD-10-CM

## 2022-02-16 ENCOUNTER — Other Ambulatory Visit: Payer: Self-pay | Admitting: Internal Medicine

## 2022-03-08 DIAGNOSIS — H401131 Primary open-angle glaucoma, bilateral, mild stage: Secondary | ICD-10-CM | POA: Diagnosis not present

## 2022-03-08 DIAGNOSIS — H04123 Dry eye syndrome of bilateral lacrimal glands: Secondary | ICD-10-CM | POA: Diagnosis not present

## 2022-03-15 ENCOUNTER — Ambulatory Visit: Payer: PPO | Admitting: Podiatry

## 2022-03-15 DIAGNOSIS — B351 Tinea unguium: Secondary | ICD-10-CM

## 2022-03-15 NOTE — Progress Notes (Unsigned)
better 

## 2022-04-01 DIAGNOSIS — D49512 Neoplasm of unspecified behavior of left kidney: Secondary | ICD-10-CM | POA: Diagnosis not present

## 2022-04-01 DIAGNOSIS — N289 Disorder of kidney and ureter, unspecified: Secondary | ICD-10-CM | POA: Diagnosis not present

## 2022-04-01 DIAGNOSIS — K7460 Unspecified cirrhosis of liver: Secondary | ICD-10-CM | POA: Diagnosis not present

## 2022-04-01 DIAGNOSIS — K5730 Diverticulosis of large intestine without perforation or abscess without bleeding: Secondary | ICD-10-CM | POA: Diagnosis not present

## 2022-04-08 DIAGNOSIS — D49512 Neoplasm of unspecified behavior of left kidney: Secondary | ICD-10-CM | POA: Diagnosis not present

## 2022-05-03 ENCOUNTER — Telehealth: Payer: Self-pay | Admitting: Internal Medicine

## 2022-05-03 NOTE — Telephone Encounter (Signed)
Pt called asking for medication for sinus symptoms for about 2 weeks. Pt reports symptoms got a lot worse last night.  SYMPTOMS: RT ear pain. sinus pain. drainage into chest- symptoms worsened last night .  Preferred Pharmacy:  [place], [place] - [address], [place] [address] Phone: [phone]  Fax: [phone]   Pt phone:  [phone]

## 2022-05-04 ENCOUNTER — Encounter: Payer: Self-pay | Admitting: Internal Medicine

## 2022-05-04 ENCOUNTER — Ambulatory Visit (INDEPENDENT_AMBULATORY_CARE_PROVIDER_SITE_OTHER): Payer: PPO | Admitting: Internal Medicine

## 2022-05-04 VITALS — BP 132/72 | HR 65 | Temp 98.0°F | Ht 63.0 in | Wt 175.0 lb

## 2022-05-04 DIAGNOSIS — R051 Acute cough: Secondary | ICD-10-CM

## 2022-05-04 DIAGNOSIS — E559 Vitamin D deficiency, unspecified: Secondary | ICD-10-CM

## 2022-05-04 DIAGNOSIS — J4531 Mild persistent asthma with (acute) exacerbation: Secondary | ICD-10-CM

## 2022-05-04 DIAGNOSIS — I10 Essential (primary) hypertension: Secondary | ICD-10-CM | POA: Diagnosis not present

## 2022-05-04 DIAGNOSIS — R059 Cough, unspecified: Secondary | ICD-10-CM | POA: Insufficient documentation

## 2022-05-04 LAB — POC INFLUENZA A&B (BINAX/QUICKVUE)
Influenza A, POC: NEGATIVE
Influenza B, POC: NEGATIVE

## 2022-05-04 LAB — POC SOFIA SARS ANTIGEN FIA: SARS Coronavirus 2 Ag: NEGATIVE

## 2022-05-04 MED ORDER — AZITHROMYCIN 250 MG PO TABS: ORAL_TABLET | ORAL | 1 refills | Status: DC

## 2022-05-04 MED ORDER — PREDNISONE 10 MG PO TABS: ORAL_TABLET | ORAL | 0 refills | Status: DC

## 2022-05-04 MED ORDER — HYDROCODONE BIT-HOMATROP MBR 5-1.5 MG/5ML PO SOLN: 5.0000 mL | Freq: Four times a day (QID) | ORAL | 0 refills | Status: AC | PRN

## 2022-05-04 MED ORDER — ALBUTEROL SULFATE HFA 108 (90 BASE) MCG/ACT IN AERS: 2.0000 | INHALATION_SPRAY | Freq: Four times a day (QID) | RESPIRATORY_TRACT | 1 refills | Status: DC | PRN

## 2022-05-04 NOTE — Assessment & Plan Note (Signed)
Mild to mod, for prednisone taper and albuterol inhaler prn,  to f/u any worsening symptoms or concerns

## 2022-05-04 NOTE — Progress Notes (Signed)
Patient ID: [name], female   DOB: Oct 26, 1940, 81 y.o.   MRN: [id number]        Chief Complaint: follow up cough symptoms x 3 wks       HPI:  [name] is a 81 y.o. female  Here with 2-3 days acute onset fever, facial pain, pressure, headache, general weakness and malaise, and greenish d/c, with mild ST and cough, but pt denies chest pain,orthopnea, PND, increased LE swelling, palpitations, dizziness or syncope, but has mild wheezing and mild sob x 2 days as well.   Pt denies fever, wt loss, night sweats, loss of appetite, or other constitutional symptoms         Wt Readings from Last 3 Encounters:  05/04/22 175 lb (79.4 kg)  01/18/22 175 lb (79.4 kg)  01/13/22 175 lb (79.4 kg)   BP Readings from Last 3 Encounters:  05/04/22 132/72  01/13/22 138/72  10/13/21 (!) 142/76         Past Medical History:  Diagnosis Date   Anxiety state, unspecified    CAD (coronary artery disease)    Disorder of bone and cartilage, unspecified    Diverticulosis of colon (without mention of hemorrhage)    Dizziness and giddiness    Fatty liver    Fibromyalgia    Obesity, unspecified    Other and unspecified hyperlipidemia    Other chronic nonalcoholic liver disease    Skin cancer    basal cell on face   Stroke ([place])    TIA   TIA (transient ischemic attack)    Unspecified essential hypertension    Past Surgical History:  Procedure Laterality Date   BACK SURGERY     CHOLECYSTECTOMY     MOHS SURGERY     TONSILLECTOMY AND ADENOIDECTOMY     TOTAL ABDOMINAL HYSTERECTOMY W/ BILATERAL SALPINGOOPHORECTOMY      reports that she has never smoked. She has never used smokeless tobacco. She reports that she does not drink alcohol and does not use drugs. family history includes Breast cancer in her sister and sister; Cancer in her brother; Other in her father; Stomach cancer in her mother. Allergies  Allergen Reactions   Diltiazem Hcl Other (See Comments)    REACTION: pt states it made her  dizzy...   Statins     Muscle aches   Zetia [Ezetimibe]     "it made me feel bad"   Codeine Nausea Only   Sulfonamide Derivatives Hives, Itching and Rash   Tape Itching   Tramadol Itching   Current Outpatient Medications on File Prior to Visit  Medication Sig Dispense Refill   acetaminophen (TYLENOL) 500 MG tablet Take 500 mg by mouth every 6 (six) hours as needed.     ALPRAZolam (XANAX) 0.5 MG tablet Take 1 tablet (0.5 mg total) by mouth 3 (three) times daily as needed. for anxiety 270 tablet 1   brimonidine (ALPHAGAN) 0.15 % ophthalmic solution Place 1 drop into both eyes 3 (three) times daily.  3   cetirizine (ZYRTEC) 10 MG tablet Take 1 tablet (10 mg total) by mouth daily. 90 tablet 3   dicyclomine (BENTYL) 10 MG capsule Take 1 capsule (10 mg total) by mouth 4 (four) times daily -  before meals and at bedtime. 360 capsule 1   dorzolamide (TRUSOPT) 2 % ophthalmic solution Place 1 drop into both eyes 3 (three) times daily.  1   escitalopram (LEXAPRO) 20 MG tablet Take 1 tablet (20 mg total) by mouth at  bedtime. 90 tablet 1   Evolocumab (REPATHA) 140 MG/ML SOSY Inject 1 Act into the skin every 14 (fourteen) days. 6.3 mL 1   furosemide (LASIX) 20 MG tablet TAKE 1 TABLET BY MOUTH IN THE MORNING 90 tablet 0   levothyroxine (SYNTHROID) 25 MCG tablet TAKE 1 TABLET BY MOUTH ONCE DAILY BEFORE BREAKFAST 90 tablet 0   Lifitegrast (XIIDRA) 5 % SOLN Apply 2 drops to eye 2 (two) times daily.     potassium chloride SA (KLOR-CON M) 20 MEQ tablet Take 1 tablet by mouth twice daily 180 tablet 0   Probiotic Product (ALIGN) 4 MG CAPS Take daily as directed on bottle 90 capsule 0   VASCEPA 1 g capsule TAKE 2 CAPSULES BY MOUTH IN THE MORNING AND 2 AT BEDTIME 360 capsule 0   No current facility-administered medications on file prior to visit.        ROS:  All others reviewed and negative.  Objective        PE:  BP 132/72 (BP Location: Left Arm, Patient Position: Sitting, Cuff Size: Large)   Pulse 65    Temp 98 F (36.7 C) (Oral)   Ht '5\' 3"'$  (1.6 m)   Wt 175 lb (79.4 kg)   SpO2 94%   BMI 31.00 kg/m                 Constitutional: Pt appears in NAD               HENT: Head: NCAT.                Right Ear: External ear normal.                 Left Ear: External ear normal.  Bilat tm's with mild erythema.  Max sinus areas non tender.  Pharynx with mild erythema, no exudate               Eyes: . Pupils are equal, round, and reactive to light. Conjunctivae and EOM are normal               Nose: without d/c or deformity               Neck: Neck supple. Gross normal ROM               Cardiovascular: Normal rate and regular rhythm.                 Pulmonary/Chest: Effort normal and breath sounds decreased without rales but mild wheezing.                               Neurological: Pt is alert. At baseline orientation, motor grossly intact               Skin: Skin is warm. No rashes, no other new lesions, LE edema - none               Psychiatric: Pt behavior is normal without agitation   Micro: none  Cardiac tracings I have personally interpreted today:  none  Pertinent Radiological findings (summarize): none   Lab Results  Component Value Date   WBC 4.5 01/13/2022   HGB 13.6 01/13/2022   HCT 41.2 01/13/2022   PLT 140.0 (L) 01/13/2022   GLUCOSE 101 (H) 10/13/2021   CHOL 233 (H) 01/13/2022   TRIG 112.0 01/13/2022   HDL 42.60 01/13/2022   LDLDIRECT 174.0 02/27/2019  LDLCALC 168 (H) 01/13/2022   ALT 19 10/13/2021   AST 25 10/13/2021   NA 142 10/13/2021   K 4.0 10/13/2021   CL 105 10/13/2021   CREATININE 0.92 10/13/2021   BUN 12 10/13/2021   CO2 30 10/13/2021   TSH 2.07 01/13/2022   INR 0.98 02/28/2017   HGBA1C 5.6 10/29/2020   Assessment/Plan:  [name] is a 81 y.o. White or Caucasian [1] female with  has a past medical history of Anxiety state, unspecified, CAD (coronary artery disease), Disorder of bone and cartilage, unspecified, Diverticulosis of colon (without  mention of hemorrhage), Dizziness and giddiness, Fatty liver, Fibromyalgia, Obesity, unspecified, Other and unspecified hyperlipidemia, Other chronic nonalcoholic liver disease, Skin cancer, Stroke ([place]), TIA (transient ischemic attack), and Unspecified essential hypertension.  HTN (hypertension) BP Readings from Last 3 Encounters:  05/04/22 132/72  01/13/22 138/72  10/13/21 (!) 142/76   Stable, pt to continue medical treatment - diet, wt control, excercise   Vitamin D deficiency Last vitamin D Lab Results  Component Value Date   VD25OH 24.51 (L) 02/27/2019   Low, reminded to start oral replacement   Mild persistent asthma with exacerbation Mild to mod, for prednisone taper and albuterol inhaler prn,  to f/u any worsening symptoms or concerns   Cough C/w URI - Mild to mod, for antibx course,  cough med prn, to f/u any worsening symptoms or concerns  Followup: Return if symptoms worsen or fail to improve.  Cathlean Cower, MD 05/04/2022 4:42 PM [facility]

## 2022-05-04 NOTE — Patient Instructions (Signed)
Your COVID testing and Flu testing are negative  Please take all new medication as prescribed - the antibiotic, cough medicine, prednisone and inhaler as needed  Please continue all other medications as before, and refills have been done if requested.  Please have the pharmacy call with any other refills you may need.  Please keep your appointments with your specialists as you may have planned

## 2022-05-04 NOTE — Addendum Note (Signed)
Addended by: Max Sane on: 05/04/2022 04:48 PM   Modules accepted: Orders

## 2022-05-04 NOTE — Assessment & Plan Note (Signed)
C/w URI - Mild to mod, for antibx course,  cough med prn, to f/u any worsening symptoms or concerns

## 2022-05-04 NOTE — Assessment & Plan Note (Signed)
Last vitamin D Lab Results  Component Value Date   VD25OH 24.51 (L) 02/27/2019   Low, reminded to start oral replacement

## 2022-05-04 NOTE — Assessment & Plan Note (Signed)
BP Readings from Last 3 Encounters:  05/04/22 132/72  01/13/22 138/72  10/13/21 (!) 142/76   Stable, pt to continue medical treatment - diet, wt control, excercise

## 2022-05-16 ENCOUNTER — Other Ambulatory Visit: Payer: Self-pay | Admitting: Internal Medicine

## 2022-05-19 DIAGNOSIS — F411 Generalized anxiety disorder: Secondary | ICD-10-CM | POA: Diagnosis not present

## 2022-05-19 DIAGNOSIS — E039 Hypothyroidism, unspecified: Secondary | ICD-10-CM | POA: Diagnosis not present

## 2022-05-19 DIAGNOSIS — F3342 Major depressive disorder, recurrent, in full remission: Secondary | ICD-10-CM | POA: Diagnosis not present

## 2022-05-19 DIAGNOSIS — E669 Obesity, unspecified: Secondary | ICD-10-CM | POA: Diagnosis not present

## 2022-05-19 DIAGNOSIS — G4733 Obstructive sleep apnea (adult) (pediatric): Secondary | ICD-10-CM | POA: Diagnosis not present

## 2022-05-19 DIAGNOSIS — K589 Irritable bowel syndrome without diarrhea: Secondary | ICD-10-CM | POA: Diagnosis not present

## 2022-05-19 DIAGNOSIS — H40213 Acute angle-closure glaucoma, bilateral: Secondary | ICD-10-CM | POA: Diagnosis not present

## 2022-05-19 DIAGNOSIS — J309 Allergic rhinitis, unspecified: Secondary | ICD-10-CM | POA: Diagnosis not present

## 2022-05-19 DIAGNOSIS — H04123 Dry eye syndrome of bilateral lacrimal glands: Secondary | ICD-10-CM | POA: Diagnosis not present

## 2022-05-19 DIAGNOSIS — I10 Essential (primary) hypertension: Secondary | ICD-10-CM | POA: Diagnosis not present

## 2022-05-19 DIAGNOSIS — E785 Hyperlipidemia, unspecified: Secondary | ICD-10-CM | POA: Diagnosis not present

## 2022-05-19 DIAGNOSIS — F419 Anxiety disorder, unspecified: Secondary | ICD-10-CM | POA: Diagnosis not present

## 2022-05-24 ENCOUNTER — Ambulatory Visit (INDEPENDENT_AMBULATORY_CARE_PROVIDER_SITE_OTHER): Payer: PPO | Admitting: Family Medicine

## 2022-05-24 ENCOUNTER — Encounter: Payer: Self-pay | Admitting: Family Medicine

## 2022-05-24 ENCOUNTER — Ambulatory Visit (INDEPENDENT_AMBULATORY_CARE_PROVIDER_SITE_OTHER): Payer: PPO

## 2022-05-24 VITALS — BP 130/76 | HR 88 | Temp 97.6°F | Ht 63.0 in | Wt 176.0 lb

## 2022-05-24 DIAGNOSIS — J4531 Mild persistent asthma with (acute) exacerbation: Secondary | ICD-10-CM | POA: Diagnosis not present

## 2022-05-24 DIAGNOSIS — R509 Fever, unspecified: Secondary | ICD-10-CM

## 2022-05-24 DIAGNOSIS — R051 Acute cough: Secondary | ICD-10-CM

## 2022-05-24 DIAGNOSIS — R059 Cough, unspecified: Secondary | ICD-10-CM | POA: Diagnosis not present

## 2022-05-24 LAB — CBC WITH DIFFERENTIAL/PLATELET
Basophils Absolute: 0 10*3/uL (ref 0.0–0.1)
Basophils Relative: 0.5 % (ref 0.0–3.0)
Eosinophils Absolute: 0.1 10*3/uL (ref 0.0–0.7)
Eosinophils Relative: 1.6 % (ref 0.0–5.0)
HCT: 43.5 % (ref 36.0–46.0)
Hemoglobin: 14.3 g/dL (ref 12.0–15.0)
Lymphocytes Relative: 19.5 % (ref 12.0–46.0)
Lymphs Abs: 0.7 10*3/uL (ref 0.7–4.0)
MCHC: 32.8 g/dL (ref 30.0–36.0)
MCV: 88.1 fl (ref 78.0–100.0)
Monocytes Absolute: 0.4 10*3/uL (ref 0.1–1.0)
Monocytes Relative: 10.2 % (ref 3.0–12.0)
Neutro Abs: 2.5 10*3/uL (ref 1.4–7.7)
Neutrophils Relative %: 68.2 % (ref 43.0–77.0)
Platelets: 94 10*3/uL — ABNORMAL LOW (ref 150.0–400.0)
RBC: 4.94 Mil/uL (ref 3.87–5.11)
RDW: 14.7 % (ref 11.5–15.5)
WBC: 3.6 10*3/uL — ABNORMAL LOW (ref 4.0–10.5)

## 2022-05-24 LAB — POCT INFLUENZA A/B: Influenza A, POC: NEGATIVE

## 2022-05-24 LAB — POC COVID19 BINAXNOW: SARS Coronavirus 2 Ag: NEGATIVE

## 2022-05-24 MED ORDER — HYDROCODONE BIT-HOMATROP MBR 5-1.5 MG/5ML PO SOLN: 5.0000 mL | Freq: Three times a day (TID) | ORAL | 0 refills | Status: DC | PRN

## 2022-05-24 NOTE — Patient Instructions (Addendum)
Please go downstairs for labs and a chest X ray before you leave.   Continue treating your symptoms with Tylenol, Mucinex and using your inhaler.   Drink plenty of water.   We will be in touch with your results and recommendations.   Let us know if you are getting worse or not improving in the next 3-4 days.

## 2022-05-24 NOTE — Progress Notes (Signed)
Subjective:  [name] is a 81 y.o. female who presents for a 3 day hx of fever and chills, fatigue and NP cough.  States she completed a course of antibiotics and used an albuterol inhaler for a respiratory infection and felt back to baseline prior to onset of symptoms.   States she is taking cough syrup, Hycodan, prescribed by PCP but needs a refill. States she cannot sleep well without it due to cough.     No other aggravating or relieving factors.  No other c/o.  ROS as in subjective.   Objective: Vitals:   05/24/22 1040  BP: 130/76  Pulse: 88  Temp: 97.6 F (36.4 C)  SpO2: 98%    General appearance: Alert, WD/WN, no distress, mildly ill appearing                             Skin: warm, no rash                           Head: no sinus tenderness                            Eyes: conjunctiva normal, corneas clear, PERRLA                            Ears: pearly TMs, external ear canals normal                          Nose: septum midline, no drainage              Mouth/throat: MMM, tongue normal, mild pharyngeal erythema                           Neck: supple, no adenopathy, no thyromegaly, nontender                          Heart: RRR                         Lungs: CTA bilaterally, no wheezes, rales, or rhonchi      Assessment: Acute cough - Plan: POC COVID-19, POCT Influenza A/B, CBC with Differential/Platelet, DG Chest 2 View, CBC with Differential/Platelet  Fever and chills - Plan: POC COVID-19, POCT Influenza A/B, CBC with Differential/Platelet, DG Chest 2 View, CBC with Differential/Platelet  Mild persistent asthma with exacerbation   Plan: Negative Covid and flu tests.  Chest XR and CBC ordered. No acute distress.  Recent antibiotic and oral steroid course with resolution of symptoms. Current illness is new.  Suggested symptomatic OTC remedies. Use albuterol prn.   Call/return in 3-4 days if symptoms aren't resolving.

## 2022-05-26 ENCOUNTER — Telehealth: Payer: Self-pay | Admitting: Internal Medicine

## 2022-05-26 ENCOUNTER — Other Ambulatory Visit: Payer: Self-pay | Admitting: Family Medicine

## 2022-05-26 MED ORDER — AMOXICILLIN-POT CLAVULANATE 875-125 MG PO TABS: 1.0000 | ORAL_TABLET | Freq: Two times a day (BID) | ORAL | 0 refills | Status: DC

## 2022-05-26 NOTE — Telephone Encounter (Signed)
Called daughter and notified her that antibiotic was sent in

## 2022-05-26 NOTE — Telephone Encounter (Signed)
Pt had appt 05/24/22 and is not any better and would like to know if something could be sent in?

## 2022-05-26 NOTE — Telephone Encounter (Signed)
Patients daughter called and said that patient had an appointment 05/24/22 and said that she's not getting any better and wanted to know if something could be sent in. Call back is [phone] for daughter and [phone] for patient.

## 2022-05-31 ENCOUNTER — Other Ambulatory Visit: Payer: Self-pay | Admitting: Internal Medicine

## 2022-05-31 DIAGNOSIS — I10 Essential (primary) hypertension: Secondary | ICD-10-CM

## 2022-06-02 ENCOUNTER — Ambulatory Visit (INDEPENDENT_AMBULATORY_CARE_PROVIDER_SITE_OTHER): Payer: PPO

## 2022-06-02 ENCOUNTER — Telehealth: Payer: Self-pay

## 2022-06-02 VITALS — Ht 63.0 in | Wt 175.0 lb

## 2022-06-02 DIAGNOSIS — Z0000 Encounter for general adult medical examination without abnormal findings: Secondary | ICD-10-CM | POA: Diagnosis not present

## 2022-06-02 DIAGNOSIS — Z1211 Encounter for screening for malignant neoplasm of colon: Secondary | ICD-10-CM | POA: Diagnosis not present

## 2022-06-02 NOTE — Progress Notes (Signed)
Virtual Visit via Telephone Note  I connected with  [name] on 06/02/22 at  2:15 PM EST by telephone and verified that I am speaking with the correct person using two identifiers.  Location: Patient: Home Provider: [place] Persons participating in the virtual visit: [place]   I discussed the limitations, risks, security and privacy concerns of performing an evaluation and management service by telephone and the availability of in person appointments. The patient expressed understanding and agreed to proceed.  Interactive audio and video telecommunications were attempted between this nurse and patient, however failed, due to patient having technical difficulties OR patient did not have access to video capability.  We continued and completed visit with audio only.  Some vital signs may be absent or patient reported.   Sheral Flow, LPN  Subjective:   [name] is a 81 y.o. female who presents for Medicare Annual (Subsequent) preventive examination.  Review of Systems     Cardiac Risk Factors include: advanced age (>54mn, >>31women);obesity (BMI >30kg/m2);hypertension     Objective:    Today's Vitals   06/02/22 1417  Weight: 175 lb (79.4 kg)  Height: '5\' 3"'$  (1.6 m)  PainSc: 0-No pain   Body mass index is 31 kg/m.     06/02/2022    2:19 PM 06/01/2021   12:24 PM 12/02/2019    9:49 AM 07/06/2017    9:58 AM 04/11/2017   10:19 AM 02/28/2017    8:44 PM 02/28/2017   10:01 AM  Advanced Directives  Does Patient Have a Medical Advance Directive? Yes Yes Yes No No Yes No  Type of [place] Living will;Healthcare Power of [place]   Living will   Does patient want to make changes to medical advance directive?  No - Patient declined No - Patient declined  Yes (ED - Information included in AVS) No - Patient declined   Copy of [place] Chart? No - copy requested No - copy requested No - copy requested      Would patient like information on creating a medical advance directive?    Yes (ED - Information included in AVS)   No - Patient declined    Current Medications (verified) Outpatient Encounter Medications as of 06/02/2022  Medication Sig   amoxicillin-clavulanate (AUGMENTIN) 875-125 MG tablet Take 1 tablet by mouth 2 (two) times daily.   acetaminophen (TYLENOL) 500 MG tablet Take 500 mg by mouth every 6 (six) hours as needed.   albuterol (VENTOLIN HFA) 108 (90 Base) MCG/ACT inhaler Inhale 2 puffs into the lungs every 6 (six) hours as needed for wheezing or shortness of breath.   ALPRAZolam (XANAX) 0.5 MG tablet Take 1 tablet (0.5 mg total) by mouth 3 (three) times daily as needed. for anxiety   brimonidine (ALPHAGAN) 0.15 % ophthalmic solution Place 1 drop into both eyes 3 (three) times daily.   cetirizine (ZYRTEC) 10 MG tablet Take 1 tablet (10 mg total) by mouth daily.   dicyclomine (BENTYL) 10 MG capsule Take 1 capsule (10 mg total) by mouth 4 (four) times daily -  before meals and at bedtime.   dorzolamide (TRUSOPT) 2 % ophthalmic solution Place 1 drop into both eyes 3 (three) times daily.   escitalopram (LEXAPRO) 20 MG tablet Take 1 tablet (20 mg total) by mouth at bedtime.   Evolocumab (REPATHA) 140 MG/ML SOSY Inject 1 Act into the skin every 14 (  fourteen) days.   furosemide (LASIX) 20 MG tablet TAKE 1 TABLET BY MOUTH IN THE MORNING   HYDROcodone bit-homatropine (HYCODAN) 5-1.5 MG/5ML syrup Take 5 mLs by mouth every 8 (eight) hours as needed for cough.   levothyroxine (SYNTHROID) 25 MCG tablet TAKE 1 TABLET BY MOUTH ONCE DAILY BEFORE BREAKFAST   Lifitegrast (XIIDRA) 5 % SOLN Apply 2 drops to eye 2 (two) times daily.   potassium chloride SA (KLOR-CON M) 20 MEQ tablet Take 1 tablet by mouth twice daily   Probiotic Product (ALIGN) 4 MG CAPS Take daily as directed on bottle   VASCEPA 1 g capsule TAKE 2  CAPSULES BY MOUTH IN THE MORNING AND 2 AT BEDTIME   No facility-administered encounter medications on file as of 06/02/2022.    Allergies (verified) Diltiazem hcl, Statins, Zetia [ezetimibe], Codeine, Sulfonamide derivatives, Tape, and Tramadol   History: Past Medical History:  Diagnosis Date   Anxiety state, unspecified    CAD (coronary artery disease)    Disorder of bone and cartilage, unspecified    Diverticulosis of colon (without mention of hemorrhage)    Dizziness and giddiness    Fatty liver    Fibromyalgia    Obesity, unspecified    Other and unspecified hyperlipidemia    Other chronic nonalcoholic liver disease    Skin cancer    basal cell on face   Stroke ([place])    TIA   TIA (transient ischemic attack)    Unspecified essential hypertension    Past Surgical History:  Procedure Laterality Date   BACK SURGERY     CHOLECYSTECTOMY     MOHS SURGERY     TONSILLECTOMY AND ADENOIDECTOMY     TOTAL ABDOMINAL HYSTERECTOMY W/ BILATERAL SALPINGOOPHORECTOMY     Family History  Problem Relation Age of Onset   Stomach cancer Mother    Cancer Brother    Breast cancer Sister    Other Father    Breast cancer Sister    Colon cancer Neg Hx    Social History   Socioeconomic History   Marital status: Married    Spouse name: Thompson Grayer. Rullo Sr.   Number of children: 4   Years of education: Not on file   Highest education level: Not on file  Occupational History    Employer: RETIRED  Tobacco Use   Smoking status: Never   Smokeless tobacco: Never  Vaping Use   Vaping Use: Never used  Substance and Sexual Activity   Alcohol use: No   Drug use: No   Sexual activity: Not Currently  Other Topics Concern   Not on file  Social History Narrative   Not on file   Social Determinants of Health   Financial Resource Strain: Low Risk  (06/02/2022)   Overall Financial Resource Strain (CARDIA)    Difficulty of Paying Living Expenses: Not hard at all  Food Insecurity: No Food  Insecurity (06/02/2022)   Hunger Vital Sign    Worried About Running Out of Food in the Last Year: Never true    Ran Out of Food in the Last Year: Never true  Transportation Needs: No Transportation Needs (06/02/2022)   PRAPARE - Hydrologist (Medical): No    Lack of Transportation (Non-Medical): No  Physical Activity: Inactive (06/02/2022)   Exercise Vital Sign    Days of Exercise per Week: 0 days    Minutes of Exercise per Session: 0 min  Stress: No Stress Concern Present (06/02/2022)   [employer]  of Occupational Health - Occupational Stress Questionnaire    Feeling of Stress : Not at all  Social Connections: Moderately Integrated (06/02/2022)   Social Connection and Isolation Panel [NHANES]    Frequency of Communication with Friends and Family: More than three times a week    Frequency of Social Gatherings with Friends and Family: More than three times a week    Attends Religious Services: More than 4 times per year    Active Member of [employer] or Organizations: Yes    Attends Archivist Meetings: More than 4 times per year    Marital Status: Widowed    Tobacco Counseling Counseling given: Not Answered   Clinical Intake:  Pre-visit preparation completed: Yes  Pain : No/denies pain Pain Score: 0-No pain     BMI - recorded: 31 Nutritional Status: BMI > 30  Obese Nutritional Risks: None Diabetes: No  How often do you need to have someone help you when you read instructions, pamphlets, or other written materials from your doctor or pharmacy?: 1 - Never What is the last grade level you completed in school?: 9th grade  Diabetic? no  Interpreter Needed?: No  Information entered by :: Lisette Abu, LPN.   Activities of Daily Living    06/02/2022    2:26 PM  In your present state of health, do you have any difficulty performing the following activities:  Hearing? 0  Vision? 0  Difficulty concentrating or making  decisions? 0  Walking or climbing stairs? 0  Dressing or bathing? 0  Doing errands, shopping? 0  Preparing Food and eating ? N  Using the Toilet? N  In the past six months, have you accidently leaked urine? N  Do you have problems with loss of bowel control? N  Managing your Medications? N  Managing your Finances? N  Housekeeping or managing your Housekeeping? N    Patient Care Team: Janith Lima, MD as PCP - General (Internal Medicine) Charlton Haws, [facility] as Pharmacist (Pharmacist) Jola Schmidt, MD as Consulting Physician (Ophthalmology)  Indicate any recent Medical Services you may have received from other than [facility] providers in the past year (date may be approximate).     Assessment:   This is a routine wellness examination for [name].  Hearing/Vision screen Hearing Screening - Comments:: Denies hearing difficulties   Vision Screening - Comments:: Cataracts removed - up to date with routine eye exams with Jola Schmidt, MD.   Dietary issues and exercise activities discussed: Current Exercise Habits: The patient does not participate in regular exercise at present, Exercise limited by: None identified   Goals Addressed   None   Depression Screen    06/02/2022    2:26 PM 06/01/2021   12:22 PM 10/29/2020    8:59 AM 12/02/2019    9:51 AM 12/02/2019    9:14 AM 08/05/2019   10:07 AM 02/27/2019    4:12 PM  PHQ 2/9 Scores  PHQ - 2 Score 0 0 0 0 0 0 2  PHQ- 9 Score     2  4    Fall Risk    06/02/2022    2:19 PM 06/01/2021   12:25 PM 10/29/2020    8:59 AM 12/02/2019    9:50 AM 02/27/2019    4:13 PM  Fall Risk   Falls in the past year? '1 1 1 '$ 0 0  Number falls in past yr: 1 0 0 0 0  Injury with Fall? 1 1 0 0 0  Risk  for fall due to : History of fall(s);Impaired balance/gait;Orthopedic patient   No Fall Risks Impaired balance/gait  Follow up Education provided;Falls prevention discussed Falls prevention discussed;Follow up appointment Falls evaluation completed  Falls evaluation completed;Falls prevention discussed Falls evaluation completed    FALL RISK PREVENTION PERTAINING TO THE HOME:  Any stairs in or around the home? No  If so, are there any without handrails? No  Home free of loose throw rugs in walkways, pet beds, electrical cords, etc? Yes  Adequate lighting in your home to reduce risk of falls? Yes   ASSISTIVE DEVICES UTILIZED TO PREVENT FALLS:  Life alert? No  Use of a cane, walker or w/c? No  Grab bars in the bathroom? Yes  Shower chair or bench in shower? Yes  Elevated toilet seat or a handicapped toilet? Yes   TIMED UP AND GO:  Was the test performed? No . Phone Visit  Cognitive Function:    04/11/2017   10:27 AM  MMSE - Mini Mental State Exam  Orientation to time 5  Orientation to Place 5  Registration 3  Attention/ Calculation 5  Recall 2  Language- name 2 objects 2  Language- repeat 1  Language- follow 3 step command 3  Language- read & follow direction 1  Write a sentence 1  Copy design 1  Total score 29        06/02/2022    2:26 PM 12/02/2019    9:53 AM  6CIT Screen  What Year? 0 points 0 points  What month? 0 points 0 points  What time? 0 points 0 points  Count back from 20 0 points 0 points  Months in reverse 0 points 2 points  Repeat phrase 0 points 2 points  Total Score 0 points 4 points    Immunizations Immunization History  Administered Date(s) Administered   Fluad Quad(high Dose 65+) 02/27/2019, 02/25/2022   Influenza Split 03/27/2011, 03/30/2012   Influenza Whole 04/06/2009, 04/12/2010   Influenza, High Dose Seasonal PF 03/09/2017   Influenza,inj,Quad PF,6+ Mos 04/18/2013, 02/19/2014, 03/31/2015, 03/04/2016   Influenza-Unspecified 02/18/2018, 04/09/2020, 04/08/2021   Moderna Sars-Covid-2 Vaccination 07/03/2019, 07/31/2019, 04/10/2020   Pneumococcal Conjugate-13 03/18/2016   Pneumococcal Polysaccharide-23 03/21/2007   Tdap 10/17/2011, 10/04/2017   Zoster Recombinat (Shingrix)  12/16/2019, 03/18/2020    TDAP status: Up to date  Flu Vaccine status: Up to date  Pneumococcal vaccine status: Up to date  Covid-19 vaccine status: Completed vaccines  Qualifies for Shingles Vaccine? Yes   Zostavax completed No   Shingrix Completed?: Yes  Screening Tests Health Maintenance  Topic Date Due   COLONOSCOPY (Pts 45-39yr Insurance coverage will need to be confirmed)  05/02/2021   COVID-19 Vaccine (4 - 2023-24 season) 06/09/2022 (Originally 02/18/2022)   Medicare Annual Wellness (AWV)  06/03/2023   DTaP/Tdap/Td (3 - Td or Tdap) 10/05/2027   Pneumonia Vaccine 81 Years old  Completed   INFLUENZA VACCINE  Completed   Zoster Vaccines- Shingrix  Completed   HPV VACCINES  Aged Out   DEXA SCAN  Discontinued    Health Maintenance  Health Maintenance Due  Topic Date Due   COLONOSCOPY (Pts 45-427yrInsurance coverage will need to be confirmed)  05/02/2021    Colorectal cancer screening: Referral to GI placed 06/02/2022. Pt aware the office will call re: appt.  Mammogram status: Completed scheduled for 07/09/2022. Repeat every year  Bone density status: Discontinued  Lung Cancer Screening: (Low Dose CT Chest recommended if Age 475-80ears, 30 pack-year currently smoking OR have quit  w/in 15years.) does not qualify.   Lung Cancer Screening Referral: no  Additional Screening:  Hepatitis C Screening: does not qualify; Completed no  Vision Screening: Recommended annual ophthalmology exams for early detection of glaucoma and other disorders of the eye. Is the patient up to date with their annual eye exam?  Yes  Who is the provider or what is the name of the office in which the patient attends annual eye exams? Jola Schmidt, MD. If pt is not established with a provider, would they like to be referred to a provider to establish care? No .   Dental Screening: Recommended annual dental exams for proper oral hygiene  Community Resource Referral / Chronic Care  Management: CRR required this visit?  No   CCM required this visit?  No      Plan:     I have personally reviewed and noted the following in the patient's chart:   Medical and social history Use of alcohol, tobacco or illicit drugs  Current medications and supplements including opioid prescriptions. Patient is not currently taking opioid prescriptions. Functional ability and status Nutritional status Physical activity Advanced directives List of other physicians Hospitalizations, surgeries, and ER visits in previous 12 months Vitals Screenings to include cognitive, depression, and falls Referrals and appointments  In addition, I have reviewed and discussed with patient certain preventive protocols, quality metrics, and best practice recommendations. A written personalized care plan for preventive services as well as general preventive health recommendations were provided to patient.     Sheral Flow, LPN   [id number]   Nurse Notes: N/A

## 2022-06-02 NOTE — Telephone Encounter (Signed)
Patient is still not feeling well.  Would like to be worked in with Ronnald Ramp if possible before the holidays.

## 2022-06-02 NOTE — Patient Instructions (Signed)
[name] , Thank you for taking time to come for your Medicare Wellness Visit. I appreciate your ongoing commitment to your health goals. Please review the following plan we discussed and let me know if I can assist you in the future.   These are the goals we discussed:  Goals      Continue to take time for myself daily     I will continue to read the bible and enjoy cooking daily. Enjoy life, family and worship God.         This is a list of the screening recommended for you and due dates:  Health Maintenance  Topic Date Due   Colon Cancer Screening  05/02/2021   COVID-19 Vaccine (4 - 2023-24 season) 06/09/2022*   Medicare Annual Wellness Visit  06/03/2023   DTaP/Tdap/Td vaccine (3 - Td or Tdap) 10/05/2027   Pneumonia Vaccine  Completed   Flu Shot  Completed   Zoster (Shingles) Vaccine  Completed   HPV Vaccine  Aged Out   DEXA scan (bone density measurement)  Discontinued  *Topic was postponed. The date shown is not the original due date.    Advanced directives: Yes  Conditions/risks identified: Yes  Next appointment: Follow up in one year for your annual wellness visit.   Preventive Care 41 Years and Older, Female Preventive care refers to lifestyle choices and visits with your health care provider that can promote health and wellness. What does preventive care include? A yearly physical exam. This is also called an annual well check. Dental exams once or twice a year. Routine eye exams. Ask your health care provider how often you should have your eyes checked. Personal lifestyle choices, including: Daily care of your teeth and gums. Regular physical activity. Eating a healthy diet. Avoiding tobacco and drug use. Limiting alcohol use. Practicing safe sex. Taking low-dose aspirin every day. Taking vitamin and mineral supplements as recommended by your health care provider. What happens during an annual well check? The services and screenings done by your health care  provider during your annual well check will depend on your age, overall health, lifestyle risk factors, and family history of disease. Counseling  Your health care provider may ask you questions about your: Alcohol use. Tobacco use. Drug use. Emotional well-being. Home and relationship well-being. Sexual activity. Eating habits. History of falls. Memory and ability to understand (cognition). Work and work Statistician. Reproductive health. Screening  You may have the following tests or measurements: Height, weight, and BMI. Blood pressure. Lipid and cholesterol levels. These may be checked every 5 years, or more frequently if you are over 80 years old. Skin check. Lung cancer screening. You may have this screening every year starting at age 53 if you have a 30-pack-year history of smoking and currently smoke or have quit within the past 15 years. Fecal occult blood test (FOBT) of the stool. You may have this test every year starting at age 46. Flexible sigmoidoscopy or colonoscopy. You may have a sigmoidoscopy every 5 years or a colonoscopy every 10 years starting at age 43. Hepatitis C blood test. Hepatitis B blood test. Sexually transmitted disease (STD) testing. Diabetes screening. This is done by checking your blood sugar (glucose) after you have not eaten for a while (fasting). You may have this done every 1-3 years. Bone density scan. This is done to screen for osteoporosis. You may have this done starting at age 76. Mammogram. This may be done every 1-2 years. Talk to your health care  provider about how often you should have regular mammograms. Talk with your health care provider about your test results, treatment options, and if necessary, the need for more tests. Vaccines  Your health care provider may recommend certain vaccines, such as: Influenza vaccine. This is recommended every year. Tetanus, diphtheria, and acellular pertussis (Tdap, Td) vaccine. You may need a Td  booster every 10 years. Zoster vaccine. You may need this after age 54. Pneumococcal 13-valent conjugate (PCV13) vaccine. One dose is recommended after age 9. Pneumococcal polysaccharide (PPSV23) vaccine. One dose is recommended after age 10. Talk to your health care provider about which screenings and vaccines you need and how often you need them. This information is not intended to replace advice given to you by your health care provider. Make sure you discuss any questions you have with your health care provider. Document Released: 07/03/2015 Document Revised: 02/24/2016 Document Reviewed: 04/07/2015 Elsevier Interactive Patient Education  2017 [place] Prevention in the Home Falls can cause injuries. They can happen to people of all ages. There are many things you can do to make your home safe and to help prevent falls. What can I do on the outside of my home? Regularly fix the edges of walkways and driveways and fix any cracks. Remove anything that might make you trip as you walk through a door, such as a raised step or threshold. Trim any bushes or trees on the path to your home. Use bright outdoor lighting. Clear any walking paths of anything that might make someone trip, such as rocks or tools. Regularly check to see if handrails are loose or broken. Make sure that both sides of any steps have handrails. Any raised decks and porches should have guardrails on the edges. Have any leaves, snow, or ice cleared regularly. Use sand or salt on walking paths during winter. Clean up any spills in your garage right away. This includes oil or grease spills. What can I do in the bathroom? Use night lights. Install grab bars by the toilet and in the tub and shower. Do not use towel bars as grab bars. Use non-skid mats or decals in the tub or shower. If you need to sit down in the shower, use a plastic, non-slip stool. Keep the floor dry. Clean up any water that spills on the floor  as soon as it happens. Remove soap buildup in the tub or shower regularly. Attach bath mats securely with double-sided non-slip rug tape. Do not have throw rugs and other things on the floor that can make you trip. What can I do in the bedroom? Use night lights. Make sure that you have a light by your bed that is easy to reach. Do not use any sheets or blankets that are too big for your bed. They should not hang down onto the floor. Have a firm chair that has side arms. You can use this for support while you get dressed. Do not have throw rugs and other things on the floor that can make you trip. What can I do in the kitchen? Clean up any spills right away. Avoid walking on wet floors. Keep items that you use a lot in easy-to-reach places. If you need to reach something above you, use a strong step stool that has a grab bar. Keep electrical cords out of the way. Do not use floor polish or wax that makes floors slippery. If you must use wax, use non-skid floor wax. Do not have throw rugs  and other things on the floor that can make you trip. What can I do with my stairs? Do not leave any items on the stairs. Make sure that there are handrails on both sides of the stairs and use them. Fix handrails that are broken or loose. Make sure that handrails are as long as the stairways. Check any carpeting to make sure that it is firmly attached to the stairs. Fix any carpet that is loose or worn. Avoid having throw rugs at the top or bottom of the stairs. If you do have throw rugs, attach them to the floor with carpet tape. Make sure that you have a light switch at the top of the stairs and the bottom of the stairs. If you do not have them, ask someone to add them for you. What else can I do to help prevent falls? Wear shoes that: Do not have high heels. Have rubber bottoms. Are comfortable and fit you well. Are closed at the toe. Do not wear sandals. If you use a stepladder: Make sure that it is  fully opened. Do not climb a closed stepladder. Make sure that both sides of the stepladder are locked into place. Ask someone to hold it for you, if possible. Clearly mark and make sure that you can see: Any grab bars or handrails. First and last steps. Where the edge of each step is. Use tools that help you move around (mobility aids) if they are needed. These include: Canes. Walkers. Scooters. Crutches. Turn on the lights when you go into a dark area. Replace any light bulbs as soon as they burn out. Set up your furniture so you have a clear path. Avoid moving your furniture around. If any of your floors are uneven, fix them. If there are any pets around you, be aware of where they are. Review your medicines with your doctor. Some medicines can make you feel dizzy. This can increase your chance of falling. Ask your doctor what other things that you can do to help prevent falls. This information is not intended to replace advice given to you by your health care provider. Make sure you discuss any questions you have with your health care provider. Document Released: 04/02/2009 Document Revised: 11/12/2015 Document Reviewed: 07/11/2014 Elsevier Interactive Patient Education  2017 [employer].

## 2022-06-17 ENCOUNTER — Telehealth: Payer: Self-pay

## 2022-06-17 MED ORDER — AZITHROMYCIN 250 MG PO TABS: ORAL_TABLET | ORAL | 1 refills | Status: DC

## 2022-06-17 NOTE — Addendum Note (Signed)
Addended by: Biagio Borg on: 06/17/2022 11:41 AM   Modules accepted: Orders

## 2022-06-17 NOTE — Telephone Encounter (Signed)
Ok for zpack - done erx 

## 2022-06-17 NOTE — Telephone Encounter (Signed)
Pt has stated she has been sick for over a month and is now having a bad ear ache in her ears again... She finished her abx that was rx'd for an ear infection however, she feels it is back.

## 2022-06-27 ENCOUNTER — Encounter: Payer: Self-pay | Admitting: Internal Medicine

## 2022-06-27 ENCOUNTER — Ambulatory Visit (INDEPENDENT_AMBULATORY_CARE_PROVIDER_SITE_OTHER): Payer: PPO | Admitting: Internal Medicine

## 2022-06-27 VITALS — BP 122/78 | HR 73 | Temp 98.0°F | Ht 63.0 in | Wt 172.0 lb

## 2022-06-27 DIAGNOSIS — H663X3 Other chronic suppurative otitis media, bilateral: Secondary | ICD-10-CM | POA: Diagnosis not present

## 2022-06-27 DIAGNOSIS — H6690 Otitis media, unspecified, unspecified ear: Secondary | ICD-10-CM | POA: Insufficient documentation

## 2022-06-27 DIAGNOSIS — H9193 Unspecified hearing loss, bilateral: Secondary | ICD-10-CM

## 2022-06-27 DIAGNOSIS — H9190 Unspecified hearing loss, unspecified ear: Secondary | ICD-10-CM | POA: Insufficient documentation

## 2022-06-27 MED ORDER — FLUCONAZOLE 150 MG PO TABS: 150.0000 mg | ORAL_TABLET | Freq: Once | ORAL | 1 refills | Status: AC

## 2022-06-27 MED ORDER — LEVOFLOXACIN 500 MG PO TABS: 500.0000 mg | ORAL_TABLET | Freq: Every day | ORAL | 1 refills | Status: DC

## 2022-06-27 NOTE — Assessment & Plan Note (Signed)
Bilateral, severe and refractory Urgent ENT ref - will probably need tubes Levaquin po x2-4 wks w/probiotic, Diflucan Warned re tendons complications, etc

## 2022-06-27 NOTE — Progress Notes (Signed)
Subjective:  Patient ID: [name], female    DOB: April 02, 1941  Age: 82 y.o. MRN: [id number]  CC: ear infection  (Started back in November , can't hear out of right ear and it hurts )   HPI DAISIE HAFT presents for hearing loss, ear pain - worse on the R. Was some better w/abx (Augmentin, Z pac) - now is worse again  Outpatient Medications Prior to Visit  Medication Sig Dispense Refill   acetaminophen (TYLENOL) 500 MG tablet Take 500 mg by mouth every 6 (six) hours as needed.     albuterol (VENTOLIN HFA) 108 (90 Base) MCG/ACT inhaler Inhale 2 puffs into the lungs every 6 (six) hours as needed for wheezing or shortness of breath. 8 g 1   ALPRAZolam (XANAX) 0.5 MG tablet Take 1 tablet (0.5 mg total) by mouth 3 (three) times daily as needed. for anxiety 270 tablet 1   brimonidine (ALPHAGAN) 0.15 % ophthalmic solution Place 1 drop into both eyes 3 (three) times daily.  3   cetirizine (ZYRTEC) 10 MG tablet Take 1 tablet (10 mg total) by mouth daily. 90 tablet 3   dicyclomine (BENTYL) 10 MG capsule Take 1 capsule (10 mg total) by mouth 4 (four) times daily -  before meals and at bedtime. 360 capsule 1   dorzolamide (TRUSOPT) 2 % ophthalmic solution Place 1 drop into both eyes 3 (three) times daily.  1   escitalopram (LEXAPRO) 20 MG tablet Take 1 tablet (20 mg total) by mouth at bedtime. 90 tablet 1   FLUAD QUADRIVALENT 0.5 ML injection      furosemide (LASIX) 20 MG tablet TAKE 1 TABLET BY MOUTH IN THE MORNING 90 tablet 0   levothyroxine (SYNTHROID) 25 MCG tablet TAKE 1 TABLET BY MOUTH ONCE DAILY BEFORE BREAKFAST 90 tablet 0   Lifitegrast (XIIDRA) 5 % SOLN Apply 2 drops to eye 2 (two) times daily.     potassium chloride SA (KLOR-CON M) 20 MEQ tablet Take 1 tablet by mouth twice daily 180 tablet 0   Probiotic Product (ALIGN) 4 MG CAPS Take daily as directed on bottle 90 capsule 0   VASCEPA 1 g capsule TAKE 2 CAPSULES BY MOUTH IN THE MORNING AND 2 AT BEDTIME 360 capsule 0   Evolocumab  (REPATHA) 140 MG/ML SOSY Inject 1 Act into the skin every 14 (fourteen) days. (Patient not taking: Reported on 06/27/2022) 6.3 mL 1   HYDROcodone bit-homatropine (HYCODAN) 5-1.5 MG/5ML syrup Take 5 mLs by mouth every 8 (eight) hours as needed for cough. (Patient not taking: Reported on 06/27/2022) 120 mL 0   No facility-administered medications prior to visit.    ROS: Review of Systems  Constitutional:  Negative for activity change, appetite change, chills, fatigue and unexpected weight change.  HENT:  Positive for congestion, ear pain, hearing loss and sinus pressure. Negative for mouth sores and sinus pain.   Eyes:  Negative for visual disturbance.  Respiratory:  Negative for cough and chest tightness.   Gastrointestinal:  Negative for abdominal pain and nausea.  Genitourinary:  Negative for difficulty urinating, frequency and vaginal pain.  Musculoskeletal:  Negative for back pain and gait problem.  Skin:  Negative for pallor and rash.  Neurological:  Negative for dizziness, tremors, weakness, numbness and headaches.  Psychiatric/Behavioral:  Negative for confusion and sleep disturbance.     Objective:  BP 122/78 (BP Location: Left Arm, Patient Position: Sitting, Cuff Size: Normal)   Pulse 73   Temp 98 F (36.7 C) (  Oral)   Ht '5\' 3"'$  (1.6 m)   Wt 172 lb (78 kg)   SpO2 94%   BMI 30.47 kg/m   BP Readings from Last 3 Encounters:  06/27/22 122/78  05/24/22 130/76  05/04/22 132/72    Wt Readings from Last 3 Encounters:  06/27/22 172 lb (78 kg)  06/02/22 175 lb (79.4 kg)  05/24/22 176 lb (79.8 kg)    Physical Exam Constitutional:      General: She is not in acute distress.    Appearance: She is well-developed. She is obese.  HENT:     Head: Normocephalic.     Right Ear: External ear normal. There is no impacted cerumen.     Left Ear: External ear normal. There is no impacted cerumen.     Nose: Nose normal.  Eyes:     General:        Right eye: No discharge.        Left  eye: No discharge.     Conjunctiva/sclera: Conjunctivae normal.     Pupils: Pupils are equal, round, and reactive to light.  Neck:     Thyroid: No thyromegaly.     Vascular: No JVD.     Trachea: No tracheal deviation.  Cardiovascular:     Rate and Rhythm: Normal rate and regular rhythm.     Heart sounds: Normal heart sounds.  Pulmonary:     Effort: No respiratory distress.     Breath sounds: No stridor. No wheezing.  Abdominal:     General: Bowel sounds are normal. There is no distension.     Palpations: Abdomen is soft. There is no mass.     Tenderness: There is no abdominal tenderness. There is no guarding or rebound.  Musculoskeletal:        General: No tenderness.     Cervical back: Normal range of motion and neck supple. No rigidity.  Lymphadenopathy:     Cervical: No cervical adenopathy.  Skin:    Findings: No erythema or rash.  Neurological:     Cranial Nerves: No cranial nerve deficit.     Motor: No abnormal muscle tone.     Coordination: Coordination normal.     Deep Tendon Reflexes: Reflexes normal.  Psychiatric:        Behavior: Behavior normal.        Thought Content: Thought content normal.        Judgment: Judgment normal.   B TMs are retracted, red, thick  Lab Results  Component Value Date   WBC 3.6 (L) 05/24/2022   HGB 14.3 05/24/2022   HCT 43.5 05/24/2022   PLT 94.0 (L) 05/24/2022   GLUCOSE 101 (H) 10/13/2021   CHOL 233 (H) 01/13/2022   TRIG 112.0 01/13/2022   HDL 42.60 01/13/2022   LDLDIRECT 174.0 02/27/2019   LDLCALC 168 (H) 01/13/2022   ALT 19 10/13/2021   AST 25 10/13/2021   NA 142 10/13/2021   K 4.0 10/13/2021   CL 105 10/13/2021   CREATININE 0.92 10/13/2021   BUN 12 10/13/2021   CO2 30 10/13/2021   TSH 2.07 01/13/2022   INR 0.98 02/28/2017   HGBA1C 5.6 10/29/2020    MYOCARDIAL PERFUSION IMAGING  Result Date: 01/18/2022   The study is normal. The study is low risk.   No ST deviation was noted.   LV perfusion is normal.   Left  ventricular function is normal. Nuclear stress EF: 52 %. The left ventricular ejection fraction is mildly decreased (45-54%). End diastolic  cavity size is normal.   Prior study available for comparison from 09/05/2013. No changes compared to prior study. Low risk stress nuclear study with normal perfusion and borderline reduction in left ventricular global systolic function. LVEF is lower - recommend correlation with echo.    Assessment & Plan:   Problem List Items Addressed This Visit       Nervous and Auditory   Otitis media - Primary    Bilateral, severe and refractory Urgent ENT ref - will probably need tubes Levaquin po x2-4 wks w/probiotic, Diflucan Warned re tendons complications, etc      Relevant Medications   levofloxacin (LEVAQUIN) 500 MG tablet   fluconazole (DIFLUCAN) 150 MG tablet   Other Relevant Orders   Ambulatory referral to ENT   Hearing loss    Due to B OM  Bilateral, severe and refractory OM Urgent ENT ref - will probably need tubes Levaquin po x2-4 wks w/probiotic, Diflucan Warned re tendons complications, etc         Meds ordered this encounter  Medications   levofloxacin (LEVAQUIN) 500 MG tablet    Sig: Take 1 tablet (500 mg total) by mouth daily.    Dispense:  14 tablet    Refill:  1   fluconazole (DIFLUCAN) 150 MG tablet    Sig: Take 1 tablet (150 mg total) by mouth once for 1 dose.    Dispense:  1 tablet    Refill:  1      Follow-up: No follow-ups on file.  Walker Kehr, MD

## 2022-06-27 NOTE — Assessment & Plan Note (Signed)
Due to B OM  Bilateral, severe and refractory OM Urgent ENT ref - will probably need tubes Levaquin po x2-4 wks w/probiotic, Diflucan Warned re tendons complications, etc

## 2022-07-05 ENCOUNTER — Other Ambulatory Visit: Payer: Self-pay | Admitting: Internal Medicine

## 2022-07-05 DIAGNOSIS — I10 Essential (primary) hypertension: Secondary | ICD-10-CM

## 2022-07-05 DIAGNOSIS — F411 Generalized anxiety disorder: Secondary | ICD-10-CM

## 2022-07-05 DIAGNOSIS — K582 Mixed irritable bowel syndrome: Secondary | ICD-10-CM

## 2022-07-06 ENCOUNTER — Other Ambulatory Visit: Payer: Self-pay | Admitting: Internal Medicine

## 2022-07-06 DIAGNOSIS — K582 Mixed irritable bowel syndrome: Secondary | ICD-10-CM

## 2022-07-08 ENCOUNTER — Encounter: Payer: Self-pay | Admitting: Internal Medicine

## 2022-07-08 NOTE — Telephone Encounter (Signed)
Patient is questioning status on ENT referral../lmb

## 2022-07-12 ENCOUNTER — Other Ambulatory Visit (HOSPITAL_COMMUNITY): Payer: Self-pay

## 2022-07-12 NOTE — Telephone Encounter (Signed)
  Patient has been reenrolled in [employer] for [employer]. Called pharmacy to give new billing info.

## 2022-07-12 NOTE — Telephone Encounter (Signed)
Pharmacy is filling Vascepa for pt, copay is $0.00

## 2022-07-14 DIAGNOSIS — H5211 Myopia, right eye: Secondary | ICD-10-CM | POA: Diagnosis not present

## 2022-07-14 DIAGNOSIS — H04123 Dry eye syndrome of bilateral lacrimal glands: Secondary | ICD-10-CM | POA: Diagnosis not present

## 2022-07-14 DIAGNOSIS — H401112 Primary open-angle glaucoma, right eye, moderate stage: Secondary | ICD-10-CM | POA: Diagnosis not present

## 2022-07-14 DIAGNOSIS — H5202 Hypermetropia, left eye: Secondary | ICD-10-CM | POA: Diagnosis not present

## 2022-07-18 ENCOUNTER — Other Ambulatory Visit: Payer: Self-pay | Admitting: Internal Medicine

## 2022-07-18 DIAGNOSIS — K582 Mixed irritable bowel syndrome: Secondary | ICD-10-CM

## 2022-08-01 ENCOUNTER — Ambulatory Visit: Payer: PPO | Admitting: Gastroenterology

## 2022-08-03 ENCOUNTER — Encounter: Payer: Self-pay | Admitting: Internal Medicine

## 2022-08-03 ENCOUNTER — Ambulatory Visit (INDEPENDENT_AMBULATORY_CARE_PROVIDER_SITE_OTHER): Payer: PPO | Admitting: Internal Medicine

## 2022-08-03 VITALS — BP 144/80 | HR 71 | Temp 98.2°F | Ht 63.0 in | Wt 176.0 lb

## 2022-08-03 DIAGNOSIS — I10 Essential (primary) hypertension: Secondary | ICD-10-CM | POA: Diagnosis not present

## 2022-08-03 DIAGNOSIS — L233 Allergic contact dermatitis due to drugs in contact with skin: Secondary | ICD-10-CM | POA: Diagnosis not present

## 2022-08-03 DIAGNOSIS — K7581 Nonalcoholic steatohepatitis (NASH): Secondary | ICD-10-CM

## 2022-08-03 DIAGNOSIS — D510 Vitamin B12 deficiency anemia due to intrinsic factor deficiency: Secondary | ICD-10-CM | POA: Diagnosis not present

## 2022-08-03 DIAGNOSIS — D518 Other vitamin B12 deficiency anemias: Secondary | ICD-10-CM | POA: Diagnosis not present

## 2022-08-03 DIAGNOSIS — Z0001 Encounter for general adult medical examination with abnormal findings: Secondary | ICD-10-CM | POA: Diagnosis not present

## 2022-08-03 DIAGNOSIS — D696 Thrombocytopenia, unspecified: Secondary | ICD-10-CM | POA: Diagnosis not present

## 2022-08-03 DIAGNOSIS — E039 Hypothyroidism, unspecified: Secondary | ICD-10-CM

## 2022-08-03 DIAGNOSIS — Z789 Other specified health status: Secondary | ICD-10-CM | POA: Diagnosis not present

## 2022-08-03 DIAGNOSIS — Z23 Encounter for immunization: Secondary | ICD-10-CM

## 2022-08-03 LAB — HEPATIC FUNCTION PANEL
ALT: 22 U/L (ref 0–35)
AST: 28 U/L (ref 0–37)
Albumin: 4.1 g/dL (ref 3.5–5.2)
Alkaline Phosphatase: 148 U/L — ABNORMAL HIGH (ref 39–117)
Bilirubin, Direct: 0.2 mg/dL (ref 0.0–0.3)
Total Bilirubin: 0.7 mg/dL (ref 0.2–1.2)
Total Protein: 7.1 g/dL (ref 6.0–8.3)

## 2022-08-03 LAB — BASIC METABOLIC PANEL
BUN: 14 mg/dL (ref 6–23)
CO2: 31 mEq/L (ref 19–32)
Calcium: 9.7 mg/dL (ref 8.4–10.5)
Chloride: 103 mEq/L (ref 96–112)
Creatinine, Ser: 0.74 mg/dL (ref 0.40–1.20)
GFR: 75.84 mL/min (ref >60.00)
Glucose, Bld: 107 mg/dL — ABNORMAL HIGH (ref 70–99)
Potassium: 4.7 mEq/L (ref 3.5–5.1)
Sodium: 140 mEq/L (ref 135–145)

## 2022-08-03 LAB — CBC WITH DIFFERENTIAL/PLATELET
Basophils Absolute: 0 10*3/uL (ref 0.0–0.1)
Basophils Relative: 0.6 % (ref 0.0–3.0)
Eosinophils Absolute: 0.1 10*3/uL (ref 0.0–0.7)
Eosinophils Relative: 2.4 % (ref 0.0–5.0)
HCT: 43 % (ref 36.0–46.0)
Hemoglobin: 14.1 g/dL (ref 12.0–15.0)
Lymphocytes Relative: 20 % (ref 12.0–46.0)
Lymphs Abs: 0.9 10*3/uL (ref 0.7–4.0)
MCHC: 32.7 g/dL (ref 30.0–36.0)
MCV: 89 fl (ref 78.0–100.0)
Monocytes Absolute: 0.3 10*3/uL (ref 0.1–1.0)
Monocytes Relative: 7.4 % (ref 3.0–12.0)
Neutro Abs: 3 10*3/uL (ref 1.4–7.7)
Neutrophils Relative %: 69.6 % (ref 43.0–77.0)
Platelets: 141 10*3/uL — ABNORMAL LOW (ref 150.0–400.0)
RBC: 4.84 Mil/uL (ref 3.87–5.11)
RDW: 14.7 % (ref 11.5–15.5)
WBC: 4.3 10*3/uL (ref 4.0–10.5)

## 2022-08-03 LAB — VITAMIN B12: Vitamin B-12: 191 pg/mL — ABNORMAL LOW (ref 211–911)

## 2022-08-03 LAB — TSH: TSH: 3.8 u[IU]/mL (ref 0.35–5.50)

## 2022-08-03 MED ORDER — LEVOTHYROXINE SODIUM 25 MCG PO TABS: 25.0000 ug | ORAL_TABLET | Freq: Every day | ORAL | 1 refills | Status: DC

## 2022-08-03 MED ORDER — FLUOCINONIDE 0.05 % EX CREA: 1.0000 | TOPICAL_CREAM | Freq: Two times a day (BID) | CUTANEOUS | 1 refills | Status: DC

## 2022-08-03 NOTE — Patient Instructions (Signed)

## 2022-08-03 NOTE — Progress Notes (Unsigned)
Subjective:  Patient ID: [name], female    DOB: 1941-05-22  Age: 82 y.o. MRN: [id number]  CC: Annual Exam, Hypothyroidism, and Rash   HPI [name] presents for a CPX and f/up -----  She complains of a one month history of itchy rash on her back. She has been applying neosporin to the area.  Outpatient Medications Prior to Visit  Medication Sig Dispense Refill   acetaminophen (TYLENOL) 500 MG tablet Take 500 mg by mouth every 6 (six) hours as needed.     albuterol (VENTOLIN HFA) 108 (90 Base) MCG/ACT inhaler Inhale 2 puffs into the lungs every 6 (six) hours as needed for wheezing or shortness of breath. 8 g 1   ALPRAZolam (XANAX) 0.5 MG tablet TAKE 1 TABLET BY MOUTH THREE TIMES DAILY AS NEEDED FOR ANXIETY 270 tablet 0   brimonidine (ALPHAGAN) 0.15 % ophthalmic solution Place 1 drop into both eyes 3 (three) times daily.  3   cetirizine (ZYRTEC) 10 MG tablet Take 1 tablet (10 mg total) by mouth daily. 90 tablet 3   dicyclomine (BENTYL) 10 MG capsule TAKE 1 CAPSULE BY MOUTH 4 TIMES DAILY BEFORE MEAL(S) AND AT BEDTIME 360 capsule 0   dorzolamide (TRUSOPT) 2 % ophthalmic solution Place 1 drop into both eyes 3 (three) times daily.  1   escitalopram (LEXAPRO) 20 MG tablet TAKE 1 TABLET BY MOUTH AT BEDTIME 90 tablet 0   Evolocumab (REPATHA) 140 MG/ML SOSY Inject 1 Act into the skin every 14 (fourteen) days. 6.3 mL 1   FLUAD QUADRIVALENT 0.5 ML injection      furosemide (LASIX) 20 MG tablet TAKE 1 TABLET BY MOUTH IN THE MORNING 90 tablet 0   HYDROcodone bit-homatropine (HYCODAN) 5-1.5 MG/5ML syrup Take 5 mLs by mouth every 8 (eight) hours as needed for cough. 120 mL 0   levofloxacin (LEVAQUIN) 500 MG tablet Take 1 tablet (500 mg total) by mouth daily. 14 tablet 1   levothyroxine (SYNTHROID) 25 MCG tablet TAKE 1 TABLET BY MOUTH ONCE DAILY BEFORE BREAKFAST 90 tablet 0   Lifitegrast (XIIDRA) 5 % SOLN Apply 2 drops to eye 2 (two) times daily.     potassium chloride SA (KLOR-CON M) 20  MEQ tablet TAKE 1  BY MOUTH TWICE DAILY 180 tablet 0   Probiotic Product (ALIGN) 4 MG CAPS Take daily as directed on bottle 90 capsule 0   VASCEPA 1 g capsule TAKE 2 CAPSULES BY MOUTH IN THE MORNING AND 2 AT BEDTIME 360 capsule 0   No facility-administered medications prior to visit.    ROS Review of Systems  Constitutional:  Positive for unexpected weight change (wt gain). Negative for diaphoresis and fatigue.  HENT: Negative.    Respiratory:  Negative for cough, chest tightness, shortness of breath and wheezing.   Cardiovascular:  Negative for chest pain, palpitations and leg swelling.  Gastrointestinal:  Negative for abdominal pain, constipation, diarrhea, nausea and vomiting.  Endocrine: Positive for cold intolerance. Negative for heat intolerance.  Genitourinary: Negative.  Negative for difficulty urinating.  Musculoskeletal: Negative.  Negative for arthralgias and myalgias.  Skin:  Positive for rash. Negative for color change.  Neurological:  Negative for dizziness, weakness, light-headedness and headaches.  Hematological:  Negative for adenopathy. Does not bruise/bleed easily.  Psychiatric/Behavioral: Negative.      Objective:  BP (!) 144/80 (BP Location: Left Arm, Patient Position: Sitting)   Pulse 71   Temp 98.2 F (36.8 C) (Oral)   Ht 5' 3"$  (1.6 m)  Wt 176 lb (79.8 kg)   SpO2 95%   BMI 31.18 kg/m   BP Readings from Last 3 Encounters:  08/03/22 (!) 144/80  06/27/22 122/78  05/24/22 130/76    Wt Readings from Last 3 Encounters:  08/03/22 176 lb (79.8 kg)  06/27/22 172 lb (78 kg)  06/02/22 175 lb (79.4 kg)    Physical Exam Vitals reviewed.  Constitutional:      Appearance: She is not ill-appearing.  HENT:     Nose: Nose normal.     Mouth/Throat:     Mouth: Mucous membranes are moist.  Eyes:     General: No scleral icterus.    Conjunctiva/sclera: Conjunctivae normal.  Cardiovascular:     Rate and Rhythm: Normal rate and regular rhythm.     Heart  sounds: No murmur heard.    No gallop.  Pulmonary:     Effort: Pulmonary effort is normal.     Breath sounds: No stridor. No wheezing, rhonchi or rales.  Abdominal:     General: Abdomen is flat. There is no distension.     Palpations: There is no hepatomegaly, splenomegaly or mass.     Tenderness: There is no abdominal tenderness. There is no guarding or rebound.     Hernia: No hernia is present.  Musculoskeletal:        General: Normal range of motion.     Cervical back: Neck supple.     Right lower leg: No edema.     Left lower leg: No edema.  Lymphadenopathy:     Cervical: No cervical adenopathy.  Skin:    General: Skin is warm and dry.     Coloration: Skin is not pale.     Findings: Rash present. No bruising, erythema or lesion.  Neurological:     General: No focal deficit present.     Mental Status: She is alert. Mental status is at baseline.     Gait: Gait normal.  Psychiatric:        Mood and Affect: Mood normal.        Behavior: Behavior normal.     Lab Results  Component Value Date   WBC 4.3 08/03/2022   HGB 14.1 08/03/2022   HCT 43.0 08/03/2022   PLT 141.0 (L) 08/03/2022   GLUCOSE 107 (H) 08/03/2022   CHOL 233 (H) 01/13/2022   TRIG 112.0 01/13/2022   HDL 42.60 01/13/2022   LDLDIRECT 174.0 02/27/2019   LDLCALC 168 (H) 01/13/2022   ALT 22 08/03/2022   AST 28 08/03/2022   NA 140 08/03/2022   K 4.7 08/03/2022   CL 103 08/03/2022   CREATININE 0.74 08/03/2022   BUN 14 08/03/2022   CO2 31 08/03/2022   TSH 3.80 08/03/2022   INR 0.98 02/28/2017   HGBA1C 5.6 10/29/2020    MYOCARDIAL PERFUSION IMAGING  Result Date: 01/18/2022   The study is normal. The study is low risk.   No ST deviation was noted.   LV perfusion is normal.   Left ventricular function is normal. Nuclear stress EF: 52 %. The left ventricular ejection fraction is mildly decreased (45-54%). End diastolic cavity size is normal.   Prior study available for comparison from 09/05/2013. No changes  compared to prior study. Low risk stress nuclear study with normal perfusion and borderline reduction in left ventricular global systolic function. LVEF is lower - recommend correlation with echo.    Assessment & Plan:   Tariya was seen today for annual exam, hypothyroidism and rash.  Diagnoses  and all orders for this visit:  Nonalcoholic steatohepatitis (NASH)- Her liver enzymes are normal. -     Hepatic function panel; Future -     Hepatic function panel  Acquired hypothyroidism- She is euthyroid. -     TSH; Future -     TSH  Thrombocytopenia ([place])- Will treat the B12 deficiency. -     CBC with Differential/Platelet; Future -     Vitamin B12; Future -     Vitamin B12 -     CBC with Differential/Platelet  Other vitamin B12 deficiency anemia- I have asked her to restart parenteral B12 replacement therapy. -     CBC with Differential/Platelet; Future -     Vitamin B12; Future -     Vitamin B12 -     CBC with Differential/Platelet  Allergic contact dermatitis due to drugs in contact with skin -     fluocinonide cream (LIDEX) 0.05 %; Apply 1 Application topically 2 (two) times daily.  Encounter for general adult medical examination with abnormal findings- Exam completed, labs reviewed, vaccines reviewed and updated, no cancer screenings indicated, patient education was given.  Primary hypertension- Her blood pressure is well-controlled. -     Basic metabolic panel; Future -     Basic metabolic panel  Statin intolerance- She is not willing to take a statin.  Vitamin B12 deficiency anemia due to intrinsic factor deficiency  Other orders -     Pneumococcal polysaccharide vaccine 23-valent greater than or equal to 2yo subcutaneous/IM   I am having Caren Griffins E. Gilbert start on fluocinonide cream. I am also having her maintain her brimonidine, dorzolamide, acetaminophen, cetirizine, Xiidra, Align, Repatha, albuterol, levothyroxine, HYDROcodone bit-homatropine, furosemide, Fluad  Quadrivalent, levofloxacin, potassium chloride SA, Vascepa, escitalopram, ALPRAZolam, and dicyclomine.  Meds ordered this encounter  Medications   fluocinonide cream (LIDEX) 0.05 %    Sig: Apply 1 Application topically 2 (two) times daily.    Dispense:  30 g    Refill:  1     Follow-up: Return in about 6 months (around 02/01/2023).  Scarlette Calico, MD

## 2022-08-04 ENCOUNTER — Other Ambulatory Visit: Payer: Self-pay | Admitting: Internal Medicine

## 2022-08-11 ENCOUNTER — Ambulatory Visit: Payer: PPO

## 2022-08-12 ENCOUNTER — Ambulatory Visit: Payer: PPO

## 2022-08-22 ENCOUNTER — Ambulatory Visit (INDEPENDENT_AMBULATORY_CARE_PROVIDER_SITE_OTHER): Payer: PPO

## 2022-08-22 DIAGNOSIS — E538 Deficiency of other specified B group vitamins: Secondary | ICD-10-CM | POA: Diagnosis not present

## 2022-08-22 MED ORDER — CYANOCOBALAMIN 1000 MCG/ML IJ SOLN
1000.0000 ug | Freq: Once | INTRAMUSCULAR | Status: AC
  Administered 2022-08-22: 1000 ug via INTRAMUSCULAR

## 2022-08-22 NOTE — Progress Notes (Signed)
After obtaining consent, and per orders of Dr. Ronnald Ramp, injection of B12 given by Marrian Salvage. Patient instructed to report any adverse reaction to me immediately.

## 2022-08-31 ENCOUNTER — Other Ambulatory Visit: Payer: Self-pay | Admitting: Internal Medicine

## 2022-08-31 DIAGNOSIS — I10 Essential (primary) hypertension: Secondary | ICD-10-CM

## 2022-09-05 DIAGNOSIS — H401131 Primary open-angle glaucoma, bilateral, mild stage: Secondary | ICD-10-CM | POA: Diagnosis not present

## 2022-09-12 ENCOUNTER — Other Ambulatory Visit: Payer: Self-pay | Admitting: Internal Medicine

## 2022-09-12 DIAGNOSIS — F411 Generalized anxiety disorder: Secondary | ICD-10-CM

## 2022-09-19 ENCOUNTER — Other Ambulatory Visit: Payer: Self-pay | Admitting: Internal Medicine

## 2022-09-19 DIAGNOSIS — I10 Essential (primary) hypertension: Secondary | ICD-10-CM

## 2022-09-19 DIAGNOSIS — F411 Generalized anxiety disorder: Secondary | ICD-10-CM

## 2022-09-23 ENCOUNTER — Ambulatory Visit: Payer: PPO

## 2022-09-26 ENCOUNTER — Ambulatory Visit (INDEPENDENT_AMBULATORY_CARE_PROVIDER_SITE_OTHER): Payer: PPO

## 2022-09-26 DIAGNOSIS — E538 Deficiency of other specified B group vitamins: Secondary | ICD-10-CM | POA: Diagnosis not present

## 2022-09-26 MED ORDER — CYANOCOBALAMIN 1000 MCG/ML IJ SOLN
1000.0000 ug | Freq: Once | INTRAMUSCULAR | Status: AC
  Administered 2022-09-26: 1000 ug via INTRAMUSCULAR

## 2022-09-26 NOTE — Progress Notes (Signed)
After obtaining consent, and per orders of Dr. Jones, injection of B12 given by Darlette Dubow P Malcolm Hetz. Patient instructed to report any adverse reaction to me immediately.  

## 2022-09-30 DIAGNOSIS — H9203 Otalgia, bilateral: Secondary | ICD-10-CM | POA: Diagnosis not present

## 2022-09-30 DIAGNOSIS — H903 Sensorineural hearing loss, bilateral: Secondary | ICD-10-CM | POA: Diagnosis not present

## 2022-09-30 DIAGNOSIS — H6123 Impacted cerumen, bilateral: Secondary | ICD-10-CM | POA: Diagnosis not present

## 2022-10-28 DIAGNOSIS — H1033 Unspecified acute conjunctivitis, bilateral: Secondary | ICD-10-CM | POA: Diagnosis not present

## 2022-11-01 ENCOUNTER — Encounter: Payer: Self-pay | Admitting: Internal Medicine

## 2022-11-01 ENCOUNTER — Ambulatory Visit (INDEPENDENT_AMBULATORY_CARE_PROVIDER_SITE_OTHER): Payer: PPO | Admitting: Internal Medicine

## 2022-11-01 VITALS — BP 144/82 | HR 61 | Temp 98.1°F | Resp 16 | Ht 63.0 in | Wt 177.0 lb

## 2022-11-01 DIAGNOSIS — E538 Deficiency of other specified B group vitamins: Secondary | ICD-10-CM

## 2022-11-01 DIAGNOSIS — F411 Generalized anxiety disorder: Secondary | ICD-10-CM

## 2022-11-01 DIAGNOSIS — K582 Mixed irritable bowel syndrome: Secondary | ICD-10-CM

## 2022-11-01 DIAGNOSIS — E039 Hypothyroidism, unspecified: Secondary | ICD-10-CM

## 2022-11-01 DIAGNOSIS — D696 Thrombocytopenia, unspecified: Secondary | ICD-10-CM

## 2022-11-01 DIAGNOSIS — E781 Pure hyperglyceridemia: Secondary | ICD-10-CM

## 2022-11-01 DIAGNOSIS — I10 Essential (primary) hypertension: Secondary | ICD-10-CM

## 2022-11-01 LAB — BASIC METABOLIC PANEL
BUN: 14 mg/dL (ref 6–23)
CO2: 29 mEq/L (ref 19–32)
Calcium: 9.3 mg/dL (ref 8.4–10.5)
Chloride: 105 mEq/L (ref 96–112)
Creatinine, Ser: 0.77 mg/dL (ref 0.40–1.20)
GFR: 72.19 mL/min (ref >60.00)
Glucose, Bld: 102 mg/dL — ABNORMAL HIGH (ref 70–99)
Potassium: 4.4 mEq/L (ref 3.5–5.1)
Sodium: 141 mEq/L (ref 135–145)

## 2022-11-01 LAB — CBC WITH DIFFERENTIAL/PLATELET
Basophils Absolute: 0 10*3/uL (ref 0.0–0.1)
Basophils Relative: 0.7 % (ref 0.0–3.0)
Eosinophils Absolute: 0.1 10*3/uL (ref 0.0–0.7)
Eosinophils Relative: 2.8 % (ref 0.0–5.0)
HCT: 42.3 % (ref 36.0–46.0)
Hemoglobin: 14 g/dL (ref 12.0–15.0)
Lymphocytes Relative: 20.3 % (ref 12.0–46.0)
Lymphs Abs: 0.9 10*3/uL (ref 0.7–4.0)
MCHC: 33.1 g/dL (ref 30.0–36.0)
MCV: 88 fl (ref 78.0–100.0)
Monocytes Absolute: 0.3 10*3/uL (ref 0.1–1.0)
Monocytes Relative: 6.8 % (ref 3.0–12.0)
Neutro Abs: 3.2 10*3/uL (ref 1.4–7.7)
Neutrophils Relative %: 69.4 % (ref 43.0–77.0)
Platelets: 127 10*3/uL — ABNORMAL LOW (ref 150.0–400.0)
RBC: 4.81 Mil/uL (ref 3.87–5.11)
RDW: 14.4 % (ref 11.5–15.5)
WBC: 4.6 10*3/uL (ref 4.0–10.5)

## 2022-11-01 LAB — TSH: TSH: 4.3 u[IU]/mL (ref 0.35–5.50)

## 2022-11-01 LAB — FOLATE: Folate: >23.9 ng/mL (ref >5.9)

## 2022-11-01 MED ORDER — DICYCLOMINE HCL 10 MG PO CAPS
ORAL_CAPSULE | ORAL | 0 refills | Status: DC
Start: 2022-11-01 — End: 2023-01-08

## 2022-11-01 MED ORDER — VASCEPA 1 G PO CAPS
ORAL_CAPSULE | ORAL | 1 refills | Status: DC
Start: 2022-11-01 — End: 2023-05-23

## 2022-11-01 NOTE — Progress Notes (Signed)
Subjective:  Patient ID: [name], female    DOB: 1941/02/28  Age: 82 y.o. MRN: [id number]  CC: Hypertension and Hypothyroidism   HPI [name] presents for f/up ---  She exercises on a stationary bike and has chronic fatigue but denies diaphoresis, chest pain, shortness of breath, or edema.  Outpatient Medications Prior to Visit  Medication Sig Dispense Refill   acetaminophen (TYLENOL) 500 MG tablet Take 500 mg by mouth every 6 (six) hours as needed.     ALPRAZolam (XANAX) 0.5 MG tablet TAKE 1 TABLET BY MOUTH THREE TIMES DAILY AS NEEDED FOR ANXIETY 270 tablet 0   brimonidine (ALPHAGAN) 0.15 % ophthalmic solution Place 1 drop into both eyes 3 (three) times daily.  3   dorzolamide (TRUSOPT) 2 % ophthalmic solution Place 1 drop into both eyes 3 (three) times daily.  1   escitalopram (LEXAPRO) 20 MG tablet TAKE 1 TABLET BY MOUTH AT BEDTIME 90 tablet 0   fluocinonide cream (LIDEX) 0.05 % Apply 1 Application topically 2 (two) times daily. 30 g 1   furosemide (LASIX) 20 MG tablet TAKE 1 TABLET BY MOUTH IN THE MORNING 90 tablet 0   levothyroxine (SYNTHROID) 25 MCG tablet Take 1 tablet (25 mcg total) by mouth daily before breakfast. 90 tablet 1   Lifitegrast (XIIDRA) 5 % SOLN Apply 2 drops to eye 2 (two) times daily.     potassium chloride SA (KLOR-CON M) 20 MEQ tablet Take 1 tablet by mouth twice daily 180 tablet 0   cetirizine (ZYRTEC) 10 MG tablet Take 1 tablet (10 mg total) by mouth daily. 90 tablet 3   dicyclomine (BENTYL) 10 MG capsule TAKE 1 CAPSULE BY MOUTH 4 TIMES DAILY BEFORE MEAL(S) AND AT BEDTIME 360 capsule 0   Evolocumab (REPATHA) 140 MG/ML SOSY Inject 1 Act into the skin every 14 (fourteen) days. 6.3 mL 1   levofloxacin (LEVAQUIN) 500 MG tablet Take 1 tablet (500 mg total) by mouth daily. 14 tablet 1   Probiotic Product (ALIGN) 4 MG CAPS Take daily as directed on bottle 90 capsule 0   VASCEPA 1 g capsule TAKE 2 CAPSULES BY MOUTH IN THE MORNING AND 2 AT BEDTIME 360  capsule 0   No facility-administered medications prior to visit.    ROS Review of Systems  Constitutional:  Positive for fatigue. Negative for appetite change, chills, diaphoresis, fever and unexpected weight change.  HENT: Negative.    Eyes: Negative.   Respiratory:  Negative for cough, chest tightness, shortness of breath and wheezing.   Cardiovascular:  Negative for chest pain, palpitations and leg swelling.  Gastrointestinal:  Negative for abdominal pain, constipation, diarrhea, nausea and vomiting.  Endocrine: Negative.   Genitourinary: Negative.  Negative for difficulty urinating.  Musculoskeletal: Negative.  Negative for arthralgias, joint swelling and myalgias.  Neurological: Negative.  Negative for dizziness and weakness.  Hematological:  Negative for adenopathy. Does not bruise/bleed easily.  Psychiatric/Behavioral: Negative.      Objective:  BP (!) 144/82 (BP Location: Right Arm, Patient Position: Sitting, Cuff Size: Large)   Pulse 61   Temp 98.1 F (36.7 C) (Oral)   Ht 5\' 3"  (1.6 m)   Wt 177 lb (80.3 kg)   SpO2 96%   BMI 31.35 kg/m   BP Readings from Last 3 Encounters:  11/01/22 (!) 144/82  08/03/22 (!) 144/80  06/27/22 122/78    Wt Readings from Last 3 Encounters:  11/01/22 177 lb (80.3 kg)  08/03/22 176 lb (79.8 kg)  06/27/22 172 lb (78 kg)    Physical Exam Vitals reviewed.  Constitutional:      Appearance: Normal appearance.  HENT:     Nose: Nose normal.     Mouth/Throat:     Mouth: Mucous membranes are moist.  Eyes:     General: No scleral icterus.    Conjunctiva/sclera: Conjunctivae normal.  Cardiovascular:     Rate and Rhythm: Normal rate and regular rhythm.     Heart sounds: No murmur heard.    No friction rub. No gallop.  Pulmonary:     Effort: Pulmonary effort is normal.     Breath sounds: No stridor. No wheezing, rhonchi or rales.  Abdominal:     General: Abdomen is flat.     Palpations: There is no mass.     Tenderness: There is  no abdominal tenderness. There is no guarding or rebound.     Hernia: No hernia is present.  Musculoskeletal:        General: Normal range of motion.     Cervical back: Neck supple.  Lymphadenopathy:     Cervical: No cervical adenopathy.  Skin:    Coloration: Skin is not pale.     Findings: Ecchymosis present. No bruising or petechiae.  Neurological:     General: No focal deficit present.     Mental Status: She is alert. Mental status is at baseline.  Psychiatric:        Mood and Affect: Mood normal.        Behavior: Behavior normal.     Lab Results  Component Value Date   WBC 4.6 11/01/2022   HGB 14.0 11/01/2022   HCT 42.3 11/01/2022   PLT 127.0 (L) 11/01/2022   GLUCOSE 102 (H) 11/01/2022   CHOL 233 (H) 01/13/2022   TRIG 112.0 01/13/2022   HDL 42.60 01/13/2022   LDLDIRECT 174.0 02/27/2019   LDLCALC 168 (H) 01/13/2022   ALT 22 08/03/2022   AST 28 08/03/2022   NA 141 11/01/2022   K 4.4 11/01/2022   CL 105 11/01/2022   CREATININE 0.77 11/01/2022   BUN 14 11/01/2022   CO2 29 11/01/2022   TSH 4.30 11/01/2022   INR 0.98 02/28/2017   HGBA1C 5.6 10/29/2020    MYOCARDIAL PERFUSION IMAGING  Result Date: 01/18/2022   The study is normal. The study is low risk.   No ST deviation was noted.   LV perfusion is normal.   Left ventricular function is normal. Nuclear stress EF: 52 %. The left ventricular ejection fraction is mildly decreased (45-54%). End diastolic cavity size is normal.   Prior study available for comparison from 09/05/2013. No changes compared to prior study. Low risk stress nuclear study with normal perfusion and borderline reduction in left ventricular global systolic function. LVEF is lower - recommend correlation with echo.    Assessment & Plan:   Thrombocytopenia (HCC)- Her platelets are stable. -     CBC with Differential/Platelet; Future -     Folate; Future  B12 deficiency -     CBC with Differential/Platelet; Future -     Folate; Future  GAD  (generalized anxiety disorder)  Irritable bowel syndrome with both constipation and diarrhea -     Dicyclomine HCl; TAKE 1 CAPSULE BY MOUTH 4 TIMES DAILY BEFORE MEAL(S) AND AT BEDTIME  Dispense: 360 capsule; Refill: 0  Essential hypertension  Acquired hypothyroidism- She is euthyroid. -     TSH; Future  Pure hypertriglyceridemia -     Vascepa; TAKE 2  CAPSULES BY MOUTH IN THE MORNING AND 2 AT BEDTIME  Dispense: 360 capsule; Refill: 1  Primary hypertension- Her blood sugar is well-controlled. -     Basic metabolic panel; Future     Follow-up: Return in about 6 months (around 05/04/2023).  Sanda Linger, MD

## 2022-11-01 NOTE — Patient Instructions (Signed)

## 2022-11-24 ENCOUNTER — Telehealth: Payer: Self-pay | Admitting: Internal Medicine

## 2022-11-24 ENCOUNTER — Ambulatory Visit (INDEPENDENT_AMBULATORY_CARE_PROVIDER_SITE_OTHER): Payer: PPO | Admitting: Nurse Practitioner

## 2022-11-24 VITALS — BP 122/68 | HR 74 | Temp 97.7°F | Ht 63.0 in | Wt 178.0 lb

## 2022-11-24 DIAGNOSIS — J029 Acute pharyngitis, unspecified: Secondary | ICD-10-CM | POA: Insufficient documentation

## 2022-11-24 DIAGNOSIS — R052 Subacute cough: Secondary | ICD-10-CM | POA: Diagnosis not present

## 2022-11-24 DIAGNOSIS — L304 Erythema intertrigo: Secondary | ICD-10-CM | POA: Diagnosis not present

## 2022-11-24 DIAGNOSIS — U071 COVID-19: Secondary | ICD-10-CM | POA: Diagnosis not present

## 2022-11-24 DIAGNOSIS — E538 Deficiency of other specified B group vitamins: Secondary | ICD-10-CM | POA: Insufficient documentation

## 2022-11-24 LAB — POCT INFLUENZA A/B
Influenza A, POC: NEGATIVE
Influenza B, POC: NEGATIVE

## 2022-11-24 LAB — POCT RAPID STREP A (OFFICE): Rapid Strep A Screen: NEGATIVE

## 2022-11-24 LAB — POCT RESPIRATORY SYNCYTIAL VIRUS: RSV Rapid Ag: 2

## 2022-11-24 LAB — POC COVID19 BINAXNOW: SARS Coronavirus 2 Ag: POSITIVE — AB

## 2022-11-24 MED ORDER — NIRMATRELVIR/RITONAVIR (PAXLOVID)TABLET
3.0000 | ORAL_TABLET | Freq: Two times a day (BID) | ORAL | 0 refills | Status: AC
Start: 2022-11-24 — End: 2022-11-29

## 2022-11-24 MED ORDER — PROMETHAZINE-DM 6.25-15 MG/5ML PO SYRP
5.0000 mL | ORAL_SOLUTION | Freq: Three times a day (TID) | ORAL | 0 refills | Status: DC | PRN
Start: 2022-11-24 — End: 2023-05-23

## 2022-11-24 MED ORDER — CYANOCOBALAMIN 1000 MCG/ML IJ SOLN: 1000.0000 ug | Freq: Once | INTRAMUSCULAR | Status: DC

## 2022-11-24 MED ORDER — NYSTATIN 100000 UNIT/GM EX POWD
1.0000 | Freq: Three times a day (TID) | CUTANEOUS | 0 refills | Status: AC
Start: 2022-11-24 — End: ?

## 2022-11-24 NOTE — Progress Notes (Signed)
Established Patient Office Visit  Subjective   Patient ID: [name], female    DOB: [birth year]  Age: 82 y.o. MRN: [id number]  Chief Complaint  Patient presents with   Sinusitis    Pt states coughing up green mucus all night and this morning. Both ears hurt, sore throat. Started Monday. Pt also states she wants to get her B12 injection she was suppose to get it in the beginning of the month    Acute, symptom onset 3 days ago.  Reports having had frequent repeats and symptoms since November 2023.  Chest x-ray from 06/08/2022 negative for acute disease.  Has albuterol inhaler that she is using, but feels that she is still experiencing chest tightness and wheezing.  Has history of asthma and chronic bronchitis.  Does not see pulmonology currently.  Current symptoms include nasal congestion, sore throat, productive cough, shortness of breath, wheezing, headache, difficulty sleeping due to cough.  Has been treated in the last 6 months with course of Augmentin, azithromycin, cough syrup, as well as steroids.  Rash: Located under her abdominal skin fold on the right.  Is intermittent seems to wax and wane in intensity.  Has also noted some hyperpigmentation that is new and worsening.    Review of Systems  Constitutional:  Negative for chills, fever and malaise/fatigue.  HENT:  Positive for congestion and sore throat.   Respiratory:  Positive for cough, sputum production, shortness of breath and wheezing.   Cardiovascular:  Positive for chest pain (with deep breathing/coughing).  Neurological:  Positive for headaches.  Psychiatric/Behavioral:  The patient has insomnia.       Objective:     BP 122/68 (BP Location: Left Arm, Patient Position: Sitting, Cuff Size: Normal)   Pulse 74   Temp 97.7 F (36.5 C) (Oral)   Ht 5\' 3"  (1.6 m)   Wt 178 lb (80.7 kg)   SpO2 98%   BMI 31.53 kg/m    Physical Exam Vitals reviewed.  Constitutional:      General: She is not in acute distress.     Appearance: Normal appearance.  HENT:     Head: Normocephalic and atraumatic.     Right Ear: Hearing and tympanic membrane normal.     Left Ear: Hearing and tympanic membrane normal.  Neck:     Vascular: No carotid bruit.  Cardiovascular:     Rate and Rhythm: Normal rate and regular rhythm.     Pulses: Normal pulses.     Heart sounds: Normal heart sounds.  Pulmonary:     Effort: Pulmonary effort is normal.     Breath sounds: Normal breath sounds.  Skin:    General: Skin is warm and dry.       Neurological:     General: No focal deficit present.     Mental Status: She is alert and oriented to person, place, and time.  Psychiatric:        Mood and Affect: Mood normal.        Behavior: Behavior normal.        Judgment: Judgment normal.      Results for orders placed or performed in visit on 11/24/22  POC COVID-19  Result Value Ref Range   SARS Coronavirus 2 Ag Positive (A) Negative  POC Rapid Strep A  Result Value Ref Range   Rapid Strep A Screen Negative Negative  POCT respiratory syncytial virus  Result Value Ref Range   RSV Rapid Ag 2   POCT Influenza  A/B  Result Value Ref Range   Influenza A, POC Negative Negative   Influenza B, POC Negative Negative      The ASCVD Risk score (Arnett DK, et al., 2019) failed to calculate for the following reasons:   The 2019 ASCVD risk score is only valid for ages 85 to 13   The patient has a prior MI or stroke diagnosis    Assessment & Plan:   Problem List Items Addressed This Visit       Musculoskeletal and Integument   Intertriginous dermatitis associated with moisture    Chronic, waxes and wanes in intensity Treat with nystatin powder Refer to dermatology as well for further evaluation of the brown/black lesions      Relevant Medications   nystatin (MYCOSTATIN/NYSTOP) powder   Other Relevant Orders   Ambulatory referral to Dermatology     Other   Subacute cough    COVID-positive, see plan of care under  COVID-19 diagnosis.  RSV and flu point-of-care testing negative.      Relevant Medications   promethazine-dextromethorphan (PROMETHAZINE-DM) 6.25-15 MG/5ML syrup   Other Relevant Orders   Ambulatory referral to Pulmonology   POC COVID-19 (Completed)   POC Rapid Strep A (Completed)   POCT respiratory syncytial virus (Completed)   POCT Influenza A/B (Completed)   Sore throat    Strep negative, COVID-positive see plan of care under COVID-19 diagnosis      Relevant Orders   POC COVID-19 (Completed)   POC Rapid Strep A (Completed)   POCT respiratory syncytial virus (Completed)   POCT Influenza A/B (Completed)   COVID-19 - Primary    Acute Patient is at risk for serious illness based on age Per shared decision-making discussion patient would like to take Paxlovid, last EGFR from last month is 54.  Prescription for full-strength Paxlovid sent to patient's pharmacy. Patient educated about potential side effects Patient also educated about potential interaction between her alprazolam and Paxlovid and to avoid taking alprazolam while on Paxlovid.  She reports understanding. Promethazine dexamethasone can send to be used as needed for cough suppression, she was told to avoid driving or operating heavy scenery while taking this medication. Patient told to isolate at home for another 2 days, and then to wear a mask for approximately 10 days from symptom onset when she stops isolating. Patient educated to proceed to the emergency department if symptoms worsen and to follow-up in person at this office if symptoms persist      Relevant Medications   nystatin (MYCOSTATIN/NYSTOP) powder   promethazine-dextromethorphan (PROMETHAZINE-DM) 6.25-15 MG/5ML syrup   nirmatrelvir/ritonavir (PAXLOVID) 20 x 150 MG & 10 x 100MG  TABS    Return if symptoms worsen or fail to improve.    Elenore Paddy, NP

## 2022-11-24 NOTE — Telephone Encounter (Signed)
It appears that pt is coming in to see Jiles Prows, NP today 6/6 for this issue.

## 2022-11-24 NOTE — Assessment & Plan Note (Signed)
Acute Patient is at risk for serious illness based on age Per shared decision-making discussion patient would like to take Paxlovid, last EGFR from last month is 11.  Prescription for full-strength Paxlovid sent to patient's pharmacy. Patient educated about potential side effects Patient also educated about potential interaction between her alprazolam and Paxlovid and to avoid taking alprazolam while on Paxlovid.  She reports understanding. Promethazine dexamethasone can send to be used as needed for cough suppression, she was told to avoid driving or operating heavy scenery while taking this medication. Patient told to isolate at home for another 2 days, and then to wear a mask for approximately 10 days from symptom onset when she stops isolating. Patient educated to proceed to the emergency department if symptoms worsen and to follow-up in person at this office if symptoms persist

## 2022-11-24 NOTE — Telephone Encounter (Signed)
Pt daughter called wanting antibiotic prescribe advised daughter pt has to been seen in office pt got upset and stated she just want to speak with a nurse. Pt daughter also stated that the pt do not feel like coming in. Please advise.

## 2022-11-24 NOTE — Assessment & Plan Note (Signed)
Strep negative, COVID-positive see plan of care under COVID-19 diagnosis

## 2022-11-24 NOTE — Assessment & Plan Note (Signed)
COVID-positive, see plan of care under COVID-19 diagnosis.  RSV and flu point-of-care testing negative.

## 2022-11-24 NOTE — Assessment & Plan Note (Addendum)
Chronic, waxes and wanes in intensity Treat with nystatin powder Refer to dermatology as well for further evaluation of the brown/black lesions

## 2022-11-25 ENCOUNTER — Other Ambulatory Visit: Payer: Self-pay | Admitting: Internal Medicine

## 2022-11-25 DIAGNOSIS — I10 Essential (primary) hypertension: Secondary | ICD-10-CM

## 2022-12-02 ENCOUNTER — Ambulatory Visit: Payer: PPO

## 2022-12-27 ENCOUNTER — Other Ambulatory Visit: Payer: Self-pay | Admitting: Internal Medicine

## 2022-12-27 DIAGNOSIS — F411 Generalized anxiety disorder: Secondary | ICD-10-CM

## 2023-01-08 ENCOUNTER — Other Ambulatory Visit: Payer: Self-pay | Admitting: Internal Medicine

## 2023-01-08 DIAGNOSIS — K582 Mixed irritable bowel syndrome: Secondary | ICD-10-CM

## 2023-01-15 ENCOUNTER — Other Ambulatory Visit: Payer: Self-pay | Admitting: Internal Medicine

## 2023-01-15 DIAGNOSIS — F411 Generalized anxiety disorder: Secondary | ICD-10-CM

## 2023-01-24 ENCOUNTER — Other Ambulatory Visit: Payer: Self-pay | Admitting: Internal Medicine

## 2023-01-24 DIAGNOSIS — I10 Essential (primary) hypertension: Secondary | ICD-10-CM

## 2023-01-24 DIAGNOSIS — E039 Hypothyroidism, unspecified: Secondary | ICD-10-CM

## 2023-01-24 DIAGNOSIS — H04123 Dry eye syndrome of bilateral lacrimal glands: Secondary | ICD-10-CM | POA: Diagnosis not present

## 2023-01-24 DIAGNOSIS — H401131 Primary open-angle glaucoma, bilateral, mild stage: Secondary | ICD-10-CM | POA: Diagnosis not present

## 2023-01-26 ENCOUNTER — Institutional Professional Consult (permissible substitution): Payer: PPO | Admitting: Pulmonary Disease

## 2023-01-27 ENCOUNTER — Other Ambulatory Visit: Payer: Self-pay | Admitting: Internal Medicine

## 2023-01-27 MED ORDER — POTASSIUM CHLORIDE CRYS ER 20 MEQ PO TBCR
EXTENDED_RELEASE_TABLET | ORAL | 0 refills | Status: DC
Start: 2023-01-27 — End: 2023-06-07

## 2023-01-30 ENCOUNTER — Ambulatory Visit (INDEPENDENT_AMBULATORY_CARE_PROVIDER_SITE_OTHER): Payer: PPO | Admitting: Internal Medicine

## 2023-01-30 ENCOUNTER — Encounter: Payer: Self-pay | Admitting: Internal Medicine

## 2023-01-30 VITALS — BP 136/72 | HR 67 | Temp 97.7°F | Ht 63.0 in | Wt 178.0 lb

## 2023-01-30 DIAGNOSIS — E039 Hypothyroidism, unspecified: Secondary | ICD-10-CM

## 2023-01-30 DIAGNOSIS — C44311 Basal cell carcinoma of skin of nose: Secondary | ICD-10-CM | POA: Diagnosis not present

## 2023-01-30 DIAGNOSIS — E785 Hyperlipidemia, unspecified: Secondary | ICD-10-CM

## 2023-01-30 DIAGNOSIS — E538 Deficiency of other specified B group vitamins: Secondary | ICD-10-CM

## 2023-01-30 DIAGNOSIS — I10 Essential (primary) hypertension: Secondary | ICD-10-CM | POA: Diagnosis not present

## 2023-01-30 LAB — CBC WITH DIFFERENTIAL/PLATELET
Basophils Absolute: 0 10*3/uL (ref 0.0–0.1)
Basophils Relative: 0.5 % (ref 0.0–3.0)
Eosinophils Absolute: 0.1 10*3/uL (ref 0.0–0.7)
Eosinophils Relative: 3.7 % (ref 0.0–5.0)
HCT: 40.5 % (ref 36.0–46.0)
Hemoglobin: 13.3 g/dL (ref 12.0–15.0)
Lymphocytes Relative: 20.1 % (ref 12.0–46.0)
Lymphs Abs: 0.7 10*3/uL (ref 0.7–4.0)
MCHC: 32.9 g/dL (ref 30.0–36.0)
MCV: 87.9 fl (ref 78.0–100.0)
Monocytes Absolute: 0.2 10*3/uL (ref 0.1–1.0)
Monocytes Relative: 6.5 % (ref 3.0–12.0)
Neutro Abs: 2.5 10*3/uL (ref 1.4–7.7)
Neutrophils Relative %: 69.2 % (ref 43.0–77.0)
Platelets: 134 10*3/uL — ABNORMAL LOW (ref 150.0–400.0)
RBC: 4.6 Mil/uL (ref 3.87–5.11)
RDW: 14.4 % (ref 11.5–15.5)
WBC: 3.6 10*3/uL — ABNORMAL LOW (ref 4.0–10.5)

## 2023-01-30 LAB — BASIC METABOLIC PANEL
BUN: 11 mg/dL (ref 6–23)
CO2: 31 mEq/L (ref 19–32)
Calcium: 9.3 mg/dL (ref 8.4–10.5)
Chloride: 104 mEq/L (ref 96–112)
Creatinine, Ser: 0.72 mg/dL (ref 0.40–1.20)
GFR: 78.11 mL/min (ref >60.00)
Glucose, Bld: 105 mg/dL — ABNORMAL HIGH (ref 70–99)
Potassium: 3.7 mEq/L (ref 3.5–5.1)
Sodium: 139 mEq/L (ref 135–145)

## 2023-01-30 LAB — LIPID PANEL
Cholesterol: 201 mg/dL — ABNORMAL HIGH (ref 0–200)
HDL: 40.7 mg/dL (ref >39.00)
LDL Cholesterol: 145 mg/dL — ABNORMAL HIGH (ref 0–99)
NonHDL: 160.63
Total CHOL/HDL Ratio: 5
Triglycerides: 78 mg/dL (ref 0.0–149.0)
VLDL: 15.6 mg/dL (ref 0.0–40.0)

## 2023-01-30 MED ORDER — CYANOCOBALAMIN 1000 MCG/ML IJ SOLN
1000.0000 ug | Freq: Once | INTRAMUSCULAR | Status: AC
Start: 2023-01-30 — End: 2023-01-30
  Administered 2023-01-30: 1000 ug via INTRAMUSCULAR

## 2023-01-30 NOTE — Progress Notes (Unsigned)
Subjective:  Patient ID: [name], female    DOB: 1940-12-18  Age: 82 y.o. MRN: [id number]  CC: Coronary Artery Disease, Hypertension, and Hypothyroidism   HPI [name] presents for f/up -      Discussed the use of AI scribe software for clinical note transcription with the patient, who gave verbal consent to proceed.  History of Present Illness   The patient presents with a concern about recent weight gain and a lesion on her nose. She has a history of BCC. They deny experiencing any chest pain, shortness of breath, dizziness, or lightheadedness, and report that their anxiety is well controlled. However, they express concern about their thyroid level, suspecting it may be abnormal due to the recent weight gain. They also mention receiving vitamin B shots, but do not elaborate on the reason for this treatment.       Outpatient Medications Prior to Visit  Medication Sig Dispense Refill   acetaminophen (TYLENOL) 500 MG tablet Take 500 mg by mouth every 6 (six) hours as needed.     ALPRAZolam (XANAX) 0.5 MG tablet TAKE 1 TABLET BY MOUTH THREE TIMES DAILY AS NEEDED FOR ANXIETY 270 tablet 0   brimonidine (ALPHAGAN) 0.15 % ophthalmic solution Place 1 drop into both eyes 3 (three) times daily.  3   dicyclomine (BENTYL) 10 MG capsule TAKE 1 CAPSULE BY MOUTH 4 TIMES DAILY BEFORE MEAL(S) AND AT BEDTIME 360 capsule 0   dorzolamide (TRUSOPT) 2 % ophthalmic solution Place 1 drop into both eyes 3 (three) times daily.  1   escitalopram (LEXAPRO) 20 MG tablet TAKE 1 TABLET BY MOUTH AT BEDTIME 90 tablet 0   fluocinonide cream (LIDEX) 0.05 % Apply 1 Application topically 2 (two) times daily. 30 g 1   furosemide (LASIX) 20 MG tablet TAKE 1 TABLET BY MOUTH IN THE MORNING 90 tablet 0   levothyroxine (SYNTHROID) 25 MCG tablet TAKE 1 TABLET BY MOUTH ONCE DAILY BEFORE BREAKFAST 90 tablet 0   Lifitegrast (XIIDRA) 5 % SOLN Apply 2 drops to eye 2 (two) times daily.     nystatin (MYCOSTATIN/NYSTOP)  powder Apply 1 Application topically 3 (three) times daily. 15 g 0   potassium chloride SA (KLOR-CON M) 20 MEQ tablet Take 1 tablet by mouth twice daily 180 tablet 0   promethazine-dextromethorphan (PROMETHAZINE-DM) 6.25-15 MG/5ML syrup Take 5 mLs by mouth 3 (three) times daily as needed for cough. 118 mL 0   VASCEPA 1 g capsule TAKE 2 CAPSULES BY MOUTH IN THE MORNING AND 2 AT BEDTIME 360 capsule 1   No facility-administered medications prior to visit.    ROS Review of Systems  Objective:  BP 136/72 (BP Location: Right Arm, Patient Position: Sitting, Cuff Size: Large)   Pulse 67   Temp 97.7 F (36.5 C) (Oral)   Ht 5\' 3"  (1.6 m)   Wt 178 lb (80.7 kg)   SpO2 94%   BMI 31.53 kg/m   BP Readings from Last 3 Encounters:  01/30/23 136/72  11/24/22 122/68  11/01/22 (!) 144/82    Wt Readings from Last 3 Encounters:  01/30/23 178 lb (80.7 kg)  11/24/22 178 lb (80.7 kg)  11/01/22 177 lb (80.3 kg)    Physical Exam  Lab Results  Component Value Date   WBC 3.6 (L) 01/30/2023   HGB 13.3 01/30/2023   HCT 40.5 01/30/2023   PLT 134.0 (L) 01/30/2023   GLUCOSE 102 (H) 11/01/2022   CHOL 233 (H) 01/13/2022   TRIG 112.0  01/13/2022   HDL 42.60 01/13/2022   LDLDIRECT 174.0 02/27/2019   LDLCALC 168 (H) 01/13/2022   ALT 22 08/03/2022   AST 28 08/03/2022   NA 141 11/01/2022   K 4.4 11/01/2022   CL 105 11/01/2022   CREATININE 0.77 11/01/2022   BUN 14 11/01/2022   CO2 29 11/01/2022   TSH 4.30 11/01/2022   INR 0.98 02/28/2017   HGBA1C 5.6 10/29/2020    MYOCARDIAL PERFUSION IMAGING  Result Date: 01/18/2022   The study is normal. The study is low risk.   No ST deviation was noted.   LV perfusion is normal.   Left ventricular function is normal. Nuclear stress EF: 52 %. The left ventricular ejection fraction is mildly decreased (45-54%). End diastolic cavity size is normal.   Prior study available for comparison from 09/05/2013. No changes compared to prior study. Low risk stress nuclear  study with normal perfusion and borderline reduction in left ventricular global systolic function. LVEF is lower - recommend correlation with echo.    Assessment & Plan:  Hyperlipidemia with target LDL less than 100 -     Lipid panel; Future  B12 deficiency -     CBC with Differential/Platelet; Future -     Cyanocobalamin  Acquired hypothyroidism -     Lipid panel; Future  Basal cell carcinoma (BCC) of skin of nose -     Ambulatory referral to Dermatology  Essential hypertension -     Basic metabolic panel; Future     Follow-up: Return in about 6 months (around 08/02/2023).  Sanda Linger, MD

## 2023-01-30 NOTE — Patient Instructions (Signed)

## 2023-02-17 ENCOUNTER — Ambulatory Visit (INDEPENDENT_AMBULATORY_CARE_PROVIDER_SITE_OTHER): Payer: PPO

## 2023-02-17 ENCOUNTER — Encounter: Payer: Self-pay | Admitting: Internal Medicine

## 2023-02-17 ENCOUNTER — Ambulatory Visit: Payer: PPO | Admitting: Internal Medicine

## 2023-02-17 VITALS — BP 120/74 | HR 56 | Temp 98.3°F | Ht 63.0 in | Wt 177.0 lb

## 2023-02-17 DIAGNOSIS — M79671 Pain in right foot: Secondary | ICD-10-CM | POA: Insufficient documentation

## 2023-02-17 DIAGNOSIS — I10 Essential (primary) hypertension: Secondary | ICD-10-CM | POA: Diagnosis not present

## 2023-02-17 DIAGNOSIS — E559 Vitamin D deficiency, unspecified: Secondary | ICD-10-CM | POA: Diagnosis not present

## 2023-02-17 DIAGNOSIS — M2011 Hallux valgus (acquired), right foot: Secondary | ICD-10-CM | POA: Diagnosis not present

## 2023-02-17 DIAGNOSIS — M19071 Primary osteoarthritis, right ankle and foot: Secondary | ICD-10-CM | POA: Diagnosis not present

## 2023-02-17 NOTE — Progress Notes (Signed)
Patient ID: [name], female   DOB: [birth year], 82 y.o.   MRN: [id number]        Chief Complaint: follow up with right foot pain and swelling       HPI:  [name] is a 82 y.o. female here with c/o injury to the distal mid right foot after dropping a heavy pan x 10 days ago. Though it was mild bruising but the pain and swelling just not improving.  Still limping and wearing soft shoes.  Pt denies chest pain, increased sob or doe, wheezing, orthopnea, PND, increased LE swelling, palpitations, dizziness or syncope.   Pt denies polydipsia, polyuria, or new focal neuro s/s.          Wt Readings from Last 3 Encounters:  02/17/23 177 lb (80.3 kg)  01/30/23 178 lb (80.7 kg)  11/24/22 178 lb (80.7 kg)   BP Readings from Last 3 Encounters:  02/17/23 120/74  01/30/23 136/72  11/24/22 122/68         Past Medical History:  Diagnosis Date   Anxiety state, unspecified    CAD (coronary artery disease)    Disorder of bone and cartilage, unspecified    Diverticulosis of colon (without mention of hemorrhage)    Dizziness and giddiness    Fatty liver    Fibromyalgia    Obesity, unspecified    Other and unspecified hyperlipidemia    Other chronic nonalcoholic liver disease    Skin cancer    basal cell on face   Stroke (HCC)    TIA   TIA (transient ischemic attack)    Unspecified essential hypertension    Past Surgical History:  Procedure Laterality Date   BACK SURGERY     CHOLECYSTECTOMY     MOHS SURGERY     TONSILLECTOMY AND ADENOIDECTOMY     TOTAL ABDOMINAL HYSTERECTOMY W/ BILATERAL SALPINGOOPHORECTOMY      reports that she has never smoked. She has never used smokeless tobacco. She reports that she does not drink alcohol and does not use drugs. family history includes Breast cancer in her sister and sister; Cancer in her brother; Other in her father; Stomach cancer in her mother. Allergies  Allergen Reactions   Diltiazem Hcl Other (See Comments)    REACTION: pt states it  made her dizzy...   Statins     Muscle aches   Zetia [Ezetimibe]     "it made me feel bad"   Codeine Nausea Only   Sulfonamide Derivatives Hives, Itching and Rash   Tape Itching   Tramadol Itching   Current Outpatient Medications on File Prior to Visit  Medication Sig Dispense Refill   acetaminophen (TYLENOL) 500 MG tablet Take 500 mg by mouth every 6 (six) hours as needed.     ALPRAZolam (XANAX) 0.5 MG tablet TAKE 1 TABLET BY MOUTH THREE TIMES DAILY AS NEEDED FOR ANXIETY 270 tablet 0   brimonidine (ALPHAGAN) 0.15 % ophthalmic solution Place 1 drop into both eyes 3 (three) times daily.  3   dicyclomine (BENTYL) 10 MG capsule TAKE 1 CAPSULE BY MOUTH 4 TIMES DAILY BEFORE MEAL(S) AND AT BEDTIME 360 capsule 0   dorzolamide (TRUSOPT) 2 % ophthalmic solution Place 1 drop into both eyes 3 (three) times daily.  1   escitalopram (LEXAPRO) 20 MG tablet TAKE 1 TABLET BY MOUTH AT BEDTIME 90 tablet 0   fluocinonide cream (LIDEX) 0.05 % Apply 1 Application topically 2 (two) times daily. 30 g 1   furosemide (LASIX) 20  MG tablet TAKE 1 TABLET BY MOUTH IN THE MORNING 90 tablet 0   levothyroxine (SYNTHROID) 25 MCG tablet TAKE 1 TABLET BY MOUTH ONCE DAILY BEFORE BREAKFAST 90 tablet 0   Lifitegrast (XIIDRA) 5 % SOLN Apply 2 drops to eye 2 (two) times daily.     nystatin (MYCOSTATIN/NYSTOP) powder Apply 1 Application topically 3 (three) times daily. 15 g 0   potassium chloride SA (KLOR-CON M) 20 MEQ tablet Take 1 tablet by mouth twice daily 180 tablet 0   promethazine-dextromethorphan (PROMETHAZINE-DM) 6.25-15 MG/5ML syrup Take 5 mLs by mouth 3 (three) times daily as needed for cough. 118 mL 0   VASCEPA 1 g capsule TAKE 2 CAPSULES BY MOUTH IN THE MORNING AND 2 AT BEDTIME 360 capsule 1   No current facility-administered medications on file prior to visit.        ROS:  All others reviewed and negative.  Objective        PE:  BP 120/74 (BP Location: Right Arm, Patient Position: Sitting, Cuff Size: Normal)    Pulse (!) 56   Temp 98.3 F (36.8 C) (Oral)   Ht 5\' 3"  (1.6 m)   Wt 177 lb (80.3 kg)   SpO2 97%   BMI 31.35 kg/m                 Constitutional: Pt appears in NAD               HENT: Head: NCAT.                Right Ear: External ear normal.                 Left Ear: External ear normal.                Eyes: . Pupils are equal, round, and reactive to light. Conjunctivae and EOM are normal               Nose: without d/c or deformity               Neck: Neck supple. Gross normal ROM               Cardiovascular: Normal rate and regular rhythm.                 Pulmonary/Chest: Effort normal and breath sounds without rales or wheezing.                Abd:  Soft, NT, ND, + BS, no organomegaly               Neurological: Pt is alert. At baseline orientation, motor grossly intact               Skin: Skin is warm. No rashes, no other new lesions, LE edema - none except distal right foot with 1-2+ swelilng, tender bruising without obvious deformity but mostly involving the distal mid foot and middle 3 toes               Psychiatric: Pt behavior is normal without agitation   Micro: none  Cardiac tracings I have personally interpreted today:  none  Pertinent Radiological findings (summarize): none   Lab Results  Component Value Date   WBC 3.6 (L) 01/30/2023   HGB 13.3 01/30/2023   HCT 40.5 01/30/2023   PLT 134.0 (L) 01/30/2023   GLUCOSE 105 (H) 01/30/2023   CHOL 201 (H) 01/30/2023   TRIG 78.0 01/30/2023   HDL 40.70  01/30/2023   LDLDIRECT 174.0 02/27/2019   LDLCALC 145 (H) 01/30/2023   ALT 22 08/03/2022   AST 28 08/03/2022   NA 139 01/30/2023   K 3.7 01/30/2023   CL 104 01/30/2023   CREATININE 0.72 01/30/2023   BUN 11 01/30/2023   CO2 31 01/30/2023   TSH 4.30 11/01/2022   INR 0.98 02/28/2017   HGBA1C 5.6 10/29/2020   Assessment/Plan:  [name] is a 82 y.o. White or Caucasian [1] female with  has a past medical history of Anxiety state, unspecified, CAD (coronary  artery disease), Disorder of bone and cartilage, unspecified, Diverticulosis of colon (without mention of hemorrhage), Dizziness and giddiness, Fatty liver, Fibromyalgia, Obesity, unspecified, Other and unspecified hyperlipidemia, Other chronic nonalcoholic liver disease, Skin cancer, Stroke (HCC), TIA (transient ischemic attack), and Unspecified essential hypertension.  HTN (hypertension) BP Readings from Last 3 Encounters:  02/17/23 120/74  01/30/23 136/72  11/24/22 122/68   Stable, pt to continue medical treatment  - diet,wt control   Right foot pain With pain and swelling, can't r/o fx - for right foot xray  Vitamin D deficiency Last vitamin D Lab Results  Component Value Date   VD25OH 24.51 (L) 02/27/2019   Low, to start oral replacement  Followup: Return if symptoms worsen or fail to improve.  Oliver Barre, MD 02/17/2023 8:41 PM [facility] - [facility]

## 2023-02-17 NOTE — Assessment & Plan Note (Signed)
With pain and swelling, can't r/o fx - for right foot xray

## 2023-02-17 NOTE — Assessment & Plan Note (Signed)
BP Readings from Last 3 Encounters:  02/17/23 120/74  01/30/23 136/72  11/24/22 122/68   Stable, pt to continue medical treatment  - diet,wt control

## 2023-02-17 NOTE — Assessment & Plan Note (Signed)
Last vitamin D Lab Results  Component Value Date   VD25OH 24.51 (L) 02/27/2019   Low, to start oral replacement

## 2023-02-17 NOTE — Patient Instructions (Signed)
Please continue all other medications as before, and refills have been done if requested.  Please have the pharmacy call with any other refills you may need.  Please keep your appointments with your specialists as you may have planned  Please go to the XRAY Department in the first floor for the x-ray testing  You will be contacted by phone if any changes need to be made immediately.  Otherwise, you will receive a letter about your results with an explanation, but please check with MyChart first.

## 2023-02-21 ENCOUNTER — Other Ambulatory Visit: Payer: Self-pay | Admitting: Internal Medicine

## 2023-02-21 DIAGNOSIS — I10 Essential (primary) hypertension: Secondary | ICD-10-CM

## 2023-02-28 DIAGNOSIS — L570 Actinic keratosis: Secondary | ICD-10-CM | POA: Diagnosis not present

## 2023-02-28 DIAGNOSIS — L821 Other seborrheic keratosis: Secondary | ICD-10-CM | POA: Diagnosis not present

## 2023-02-28 DIAGNOSIS — L719 Rosacea, unspecified: Secondary | ICD-10-CM | POA: Diagnosis not present

## 2023-03-10 ENCOUNTER — Ambulatory Visit (INDEPENDENT_AMBULATORY_CARE_PROVIDER_SITE_OTHER): Payer: PPO

## 2023-03-10 DIAGNOSIS — Z23 Encounter for immunization: Secondary | ICD-10-CM | POA: Diagnosis not present

## 2023-03-10 DIAGNOSIS — E538 Deficiency of other specified B group vitamins: Secondary | ICD-10-CM | POA: Diagnosis not present

## 2023-03-10 MED ORDER — CYANOCOBALAMIN 1000 MCG/ML IJ SOLN
1000.0000 ug | Freq: Once | INTRAMUSCULAR | Status: AC
Start: 2023-03-10 — End: 2023-03-10
  Administered 2023-03-10: 1000 ug via INTRAMUSCULAR

## 2023-03-10 NOTE — Progress Notes (Signed)
Pt was given B12 and HD flu vacc w/o any complications at this time.

## 2023-03-22 ENCOUNTER — Encounter: Payer: Self-pay | Admitting: Pulmonary Disease

## 2023-03-22 ENCOUNTER — Ambulatory Visit: Payer: PPO | Admitting: Pulmonary Disease

## 2023-03-22 VITALS — BP 130/72 | HR 61 | Ht 63.0 in | Wt 177.8 lb

## 2023-03-22 DIAGNOSIS — R053 Chronic cough: Secondary | ICD-10-CM

## 2023-03-22 DIAGNOSIS — J410 Simple chronic bronchitis: Secondary | ICD-10-CM | POA: Diagnosis not present

## 2023-03-22 DIAGNOSIS — D49512 Neoplasm of unspecified behavior of left kidney: Secondary | ICD-10-CM | POA: Diagnosis not present

## 2023-03-22 MED ORDER — PREDNISONE 20 MG PO TABS: ORAL_TABLET | ORAL | 0 refills | Status: AC

## 2023-03-22 MED ORDER — AZITHROMYCIN 250 MG PO TABS: ORAL_TABLET | ORAL | 0 refills | Status: AC

## 2023-03-22 NOTE — Patient Instructions (Signed)
It is nice to meet you  I am glad you are doing better  I provided prednisone and a Z-Pak to have on hand if you start getting congestion and sick again  No changes to inhalers, we may need to think about adding additional inhalers in the future if these episodes of cough and bronchitis become more frequent  Return to clinic in 6 months or sooner as needed with Dr. Judeth Horn

## 2023-03-22 NOTE — Progress Notes (Signed)
@Patient  ID: [name], female    DOB: [birth year], 82 y.o.   MRN: [id number]  Chief Complaint  Patient presents with   Consult    Pt is here for Consult visit. Pt had Chest Congestion from 04/20/2022 - 10/18/2022. Pt states she feels good today.    Referring provider: Elenore Paddy, NP  HPI:   82 y.o. woman previously seen in pulmonary clinic by Dr. Kriste Basque whom we are seeing for evaluation of chronic cough.  Multiple PCP notes reviewed.  Most recent pulmonary note Dr. Kriste Basque 2015 in 2014 reviewed.  Today doing well.  Cough is resolved.  Started with what sounds like viral illness and ear pain, ear congestion 04/2022.  Recurrent bouts of productive cough green phlegm lasting through April 2024.  Several rounds of antibiotics and steroids.  Would get better for short period of time but then would recur.  Uses albuterol as needed.  Helps a little bit.  It makes her jittery does not like the way it feels.  Not on maintenance inhaler.  Main concern was chest congestion and cough.  Had some sinusitis issues as well but consistently endorses congestion in her chest and coughing up phlegm.  She describes bouts of recurrent bronchitis throughout her life.  Coughing up green phlegm, chest congestion.  Usually better with a Z-Pak and a prednisone course.  Has been on maintenance inhalers in the past cannot wear how much it helped.  Most recent chest imaging in the midst of chronic cough symptoms are described above 05/2022 reviewed and interpreted as clear lungs bilaterally.    Questionaires / Pulmonary Flowsheets:   ACT:      No data to display          MMRC:     No data to display          Epworth:      No data to display          Tests:   FENO:  No results found for: "NITRICOXIDE"  PFT:     No data to display          WALK:      No data to display          Imaging: Personally reviewed and as per EMR and discussion in this note No results  found.  Lab Results: Personally reviewed and as per EMR  CBC    Component Value Date/Time   WBC 3.6 (L) 01/30/2023 0903   RBC 4.60 01/30/2023 0903   HGB 13.3 01/30/2023 0903   HCT 40.5 01/30/2023 0903   PLT 134.0 (L) 01/30/2023 0903   MCV 87.9 01/30/2023 0903   MCH 28.9 07/06/2017 0958   MCHC 32.9 01/30/2023 0903   RDW 14.4 01/30/2023 0903   LYMPHSABS 0.7 01/30/2023 0903   MONOABS 0.2 01/30/2023 0903   EOSABS 0.1 01/30/2023 0903   BASOSABS 0.0 01/30/2023 0903    BMET    Component Value Date/Time   NA 139 01/30/2023 0903   K 3.7 01/30/2023 0903   CL 104 01/30/2023 0903   CO2 31 01/30/2023 0903   GLUCOSE 105 (H) 01/30/2023 0903   BUN 11 01/30/2023 0903   CREATININE 0.72 01/30/2023 0903   CALCIUM 9.3 01/30/2023 0903   GFRNONAA >60 07/06/2017 0958   GFRAA >60 07/06/2017 0958    BNP No results found for: "BNP"  ProBNP    Component Value Date/Time   PROBNP 69.0 01/13/2022 1029    Specialty Problems  Pulmonary Problems   Allergic rhinitis due to pollen   Simple chronic bronchitis (HCC)   Loud snoring   Mild persistent asthma with exacerbation    Allergies  Allergen Reactions   Diltiazem Hcl Other (See Comments)    REACTION: pt states it made her dizzy...   Statins     Muscle aches   Zetia [Ezetimibe]     "it made me feel bad"   Codeine Nausea Only   Sulfonamide Derivatives Hives, Itching and Rash   Tape Itching   Tramadol Itching    Immunization History  Administered Date(s) Administered   Fluad Quad(high Dose 65+) 02/27/2019, 02/25/2022   Fluad Trivalent(High Dose 65+) 03/10/2023   Influenza Split 03/27/2011, 03/30/2012   Influenza Whole 04/06/2009, 04/12/2010   Influenza, High Dose Seasonal PF 03/09/2017   Influenza,inj,Quad PF,6+ Mos 04/18/2013, 02/19/2014, 03/31/2015, 03/04/2016   Influenza-Unspecified 02/18/2018, 04/09/2020, 04/08/2021   Moderna Sars-Covid-2 Vaccination 07/03/2019, 07/31/2019, 04/10/2020   Pneumococcal Conjugate-13  03/18/2016   Pneumococcal Polysaccharide-23 03/21/2007, 08/03/2022   Tdap 10/17/2011, 10/04/2017   Zoster Recombinant(Shingrix) 12/16/2019, 03/18/2020    Past Medical History:  Diagnosis Date   Anxiety state, unspecified    CAD (coronary artery disease)    Disorder of bone and cartilage, unspecified    Diverticulosis of colon (without mention of hemorrhage)    Dizziness and giddiness    Fatty liver    Fibromyalgia    Obesity, unspecified    Other and unspecified hyperlipidemia    Other chronic nonalcoholic liver disease    Skin cancer    basal cell on face   Stroke (HCC)    TIA   TIA (transient ischemic attack)    Unspecified essential hypertension     Tobacco History: Social History   Tobacco Use  Smoking Status Never  Smokeless Tobacco Never   Counseling given: Not Answered   Continue to not smoke  Outpatient Encounter Medications as of 03/22/2023  Medication Sig   acetaminophen (TYLENOL) 500 MG tablet Take 500 mg by mouth every 6 (six) hours as needed.   ALPRAZolam (XANAX) 0.5 MG tablet TAKE 1 TABLET BY MOUTH THREE TIMES DAILY AS NEEDED FOR ANXIETY   azithromycin (ZITHROMAX) 250 MG tablet Take 2 tablets (500 mg total) by mouth daily for 1 day, THEN 1 tablet (250 mg total) daily for 4 days.   brimonidine (ALPHAGAN) 0.15 % ophthalmic solution Place 1 drop into both eyes 3 (three) times daily.   dicyclomine (BENTYL) 10 MG capsule TAKE 1 CAPSULE BY MOUTH 4 TIMES DAILY BEFORE MEAL(S) AND AT BEDTIME   dorzolamide (TRUSOPT) 2 % ophthalmic solution Place 1 drop into both eyes 3 (three) times daily.   escitalopram (LEXAPRO) 20 MG tablet TAKE 1 TABLET BY MOUTH AT BEDTIME   fluocinonide cream (LIDEX) 0.05 % Apply 1 Application topically 2 (two) times daily.   furosemide (LASIX) 20 MG tablet TAKE 1 TABLET BY MOUTH IN THE MORNING   levothyroxine (SYNTHROID) 25 MCG tablet TAKE 1 TABLET BY MOUTH ONCE DAILY BEFORE BREAKFAST   Lifitegrast (XIIDRA) 5 % SOLN Apply 2 drops to eye 2  (two) times daily.   nystatin (MYCOSTATIN/NYSTOP) powder Apply 1 Application topically 3 (three) times daily.   potassium chloride SA (KLOR-CON M) 20 MEQ tablet Take 1 tablet by mouth twice daily   predniSONE (DELTASONE) 20 MG tablet Take 2 tablets (40 mg total) by mouth daily with breakfast for 5 days, THEN 1 tablet (20 mg total) daily with breakfast for 5 days.   promethazine-dextromethorphan (PROMETHAZINE-DM) 6.25-15 MG/5ML  syrup Take 5 mLs by mouth 3 (three) times daily as needed for cough.   VASCEPA 1 g capsule TAKE 2 CAPSULES BY MOUTH IN THE MORNING AND 2 AT BEDTIME   No facility-administered encounter medications on file as of 03/22/2023.     Review of Systems  Review of Systems  No chest pain with exertion.  No orthopnea or PND.  Comprehensive review of systems otherwise negative. Physical Exam  BP 130/72 (BP Location: Left Arm, Cuff Size: Large)   Pulse 61   Ht 5\' 3"  (1.6 m)   Wt 177 lb 12.8 oz (80.6 kg)   SpO2 97%   BMI 31.50 kg/m   Wt Readings from Last 5 Encounters:  03/22/23 177 lb 12.8 oz (80.6 kg)  02/17/23 177 lb (80.3 kg)  01/30/23 178 lb (80.7 kg)  11/24/22 178 lb (80.7 kg)  11/01/22 177 lb (80.3 kg)    BMI Readings from Last 5 Encounters:  03/22/23 31.50 kg/m  02/17/23 31.35 kg/m  01/30/23 31.53 kg/m  11/24/22 31.53 kg/m  11/01/22 31.35 kg/m     Physical Exam General: Sitting in chair, no acute distress Eyes: EOMI, no icterus Neck: Supple, no JVP Pulmonary: Distant, clear Cardiovascular: Regular rate and rhythm, no murmur Abdomen: Nondistended, bowel sounds present MSK: No synovitis, no joint effusion Neuro: Normal gait, no weakness Psych: Normal mood, full affect   Assessment & Plan:   Chronic cough: Present for about 5 months November 2023 to April 2024.  Waxing and waning of symptoms with some nasal congestion.  Prior history of asthma per review of chart.  Chest x-ray limits of the symptoms 05/2022 clear on my review and  interpretation.  Since he is self resolved after several months.  She does again report report recurrent bronchitis symptoms in the past.  Sounds like most helpful or improve most with prednisone.  Most likely related to asthma or cough variant asthma.  Prednisone and Z-Pak to have on hand given reliable improvement in the past with this.  She declines maintenance inhaler therapy.  If episodes of bronchitis, cough recur more frequently need to reexamine use of maintenance inhaler.  Okay to continue albuterol as needed for now.   Return in about 6 months (around 09/20/2023) for f/u Dr. Judeth Horn.   Karren Burly, MD 03/22/2023   This appointment required 62 minutes of patient care (this includes precharting, chart review, review of results, face-to-face care, etc.).

## 2023-03-28 ENCOUNTER — Other Ambulatory Visit: Payer: Self-pay | Admitting: Internal Medicine

## 2023-03-28 DIAGNOSIS — F411 Generalized anxiety disorder: Secondary | ICD-10-CM

## 2023-03-29 DIAGNOSIS — N281 Cyst of kidney, acquired: Secondary | ICD-10-CM | POA: Diagnosis not present

## 2023-03-29 DIAGNOSIS — K449 Diaphragmatic hernia without obstruction or gangrene: Secondary | ICD-10-CM | POA: Diagnosis not present

## 2023-03-29 DIAGNOSIS — N289 Disorder of kidney and ureter, unspecified: Secondary | ICD-10-CM | POA: Diagnosis not present

## 2023-03-29 DIAGNOSIS — D49512 Neoplasm of unspecified behavior of left kidney: Secondary | ICD-10-CM | POA: Diagnosis not present

## 2023-03-29 DIAGNOSIS — K7689 Other specified diseases of liver: Secondary | ICD-10-CM | POA: Diagnosis not present

## 2023-04-07 ENCOUNTER — Ambulatory Visit (INDEPENDENT_AMBULATORY_CARE_PROVIDER_SITE_OTHER): Payer: PPO | Admitting: Otolaryngology

## 2023-04-07 ENCOUNTER — Encounter (INDEPENDENT_AMBULATORY_CARE_PROVIDER_SITE_OTHER): Payer: Self-pay

## 2023-04-07 VITALS — Ht 63.0 in | Wt 175.0 lb

## 2023-04-07 DIAGNOSIS — H903 Sensorineural hearing loss, bilateral: Secondary | ICD-10-CM

## 2023-04-07 DIAGNOSIS — H6122 Impacted cerumen, left ear: Secondary | ICD-10-CM

## 2023-04-07 DIAGNOSIS — H6983 Other specified disorders of Eustachian tube, bilateral: Secondary | ICD-10-CM | POA: Diagnosis not present

## 2023-04-08 DIAGNOSIS — H6122 Impacted cerumen, left ear: Secondary | ICD-10-CM | POA: Insufficient documentation

## 2023-04-08 DIAGNOSIS — H6983 Other specified disorders of Eustachian tube, bilateral: Secondary | ICD-10-CM | POA: Insufficient documentation

## 2023-04-08 DIAGNOSIS — H903 Sensorineural hearing loss, bilateral: Secondary | ICD-10-CM | POA: Insufficient documentation

## 2023-04-08 NOTE — Progress Notes (Unsigned)
Patient ID: [name], female   DOB: Nov 08, 1940, 82 y.o.   MRN: [id number]  Follow-up: Eustachian tube dysfunction, hearing loss  HPI: The patient is an 82 year old female who returns today for follow-up patient.  The patient has a long history of bilateral eustachian tube dysfunction.  She was previously treated by Dr. Dillard Cannon with myringotomy and tube placement twice.  The tubes have since extruded.  At her last visit, he was noted to have bilateral mild sensorineural hearing loss.  The patient returns today complaining of occasional clogging sensation in her ears.  She denies any significant change in her hearing.  She denies any otalgia or otorrhea.  Exam: General: Communicates without difficulty, well nourished, no acute distress. Head: Normocephalic, no evidence injury, no tenderness, facial buttresses intact without stepoff. Face/sinus: No tenderness to palpation and percussion. Facial movement is normal and symmetric. Eyes: PERRL, EOMI. No scleral icterus, conjunctivae clear. Neuro: CN II exam reveals vision grossly intact.  No nystagmus at any point of gaze. EAC: Left ear cerumen accumulation.  Nose: External evaluation reveals normal support and skin without lesions.  Dorsum is intact.  Anterior rhinoscopy reveals pink mucosa over anterior aspect of inferior turbinates and intact septum.  No purulence noted. Oral:  Oral cavity and oropharynx are intact, symmetric, without erythema or edema.  Mucosa is moist without lesions. Neck: Full range of motion without pain.  There is no significant lymphadenopathy.  No masses palpable.  Thyroid bed within normal limits to palpation.  Parotid glands and submandibular glands equal bilaterally without mass.  Trachea is midline. Neuro:  CN 2-12 grossly intact.   Procedure: Left ear cerumen removal Anesthesia: None Description: Under the operating microscope, the cerumen is carefully removed with a combination of cerumen currette, alligator  forceps, and suction catheters.  After the cerumen is removed, the TMs are noted to be intact, with bilateral soft tissue scarring.  No mass, erythema, or lesions. The patient tolerated the procedure well.   Assessment: 1.  Left ear cerumen accumulation.  After the cerumen removal procedure, both tympanic membranes are noted to be intact, with bilateral soft tissue scarring. 2.  Clinical eustachian tube dysfunction.  Her symptoms are currently under control. 3.  Subjectively stable bilateral mild sensorineural hearing loss.  Plan: 1.  Otomicroscopy with left ear cerumen removal. 2.  The physical exam findings are reviewed with the patient. 3.  Valsalva exercise to treat the eustachian tube dysfunction. 4.  The patient will return for reevaluation in 6 months, sooner if needed.

## 2023-04-28 DIAGNOSIS — H401131 Primary open-angle glaucoma, bilateral, mild stage: Secondary | ICD-10-CM | POA: Diagnosis not present

## 2023-05-20 ENCOUNTER — Other Ambulatory Visit: Payer: Self-pay | Admitting: Internal Medicine

## 2023-05-20 DIAGNOSIS — I10 Essential (primary) hypertension: Secondary | ICD-10-CM

## 2023-05-23 ENCOUNTER — Ambulatory Visit (INDEPENDENT_AMBULATORY_CARE_PROVIDER_SITE_OTHER): Payer: PPO

## 2023-05-23 ENCOUNTER — Other Ambulatory Visit: Payer: Self-pay | Admitting: Internal Medicine

## 2023-05-23 VITALS — Ht 63.0 in | Wt 175.0 lb

## 2023-05-23 DIAGNOSIS — E781 Pure hyperglyceridemia: Secondary | ICD-10-CM

## 2023-05-23 DIAGNOSIS — Z780 Asymptomatic menopausal state: Secondary | ICD-10-CM

## 2023-05-23 DIAGNOSIS — L233 Allergic contact dermatitis due to drugs in contact with skin: Secondary | ICD-10-CM

## 2023-05-23 DIAGNOSIS — Z0000 Encounter for general adult medical examination without abnormal findings: Secondary | ICD-10-CM | POA: Diagnosis not present

## 2023-05-23 DIAGNOSIS — K582 Mixed irritable bowel syndrome: Secondary | ICD-10-CM

## 2023-05-23 DIAGNOSIS — F411 Generalized anxiety disorder: Secondary | ICD-10-CM

## 2023-05-23 DIAGNOSIS — E039 Hypothyroidism, unspecified: Secondary | ICD-10-CM

## 2023-05-23 MED ORDER — ESCITALOPRAM OXALATE 20 MG PO TABS: 20.0000 mg | ORAL_TABLET | Freq: Every day | ORAL | 0 refills | Status: DC

## 2023-05-23 MED ORDER — LEVOTHYROXINE SODIUM 25 MCG PO TABS: 25.0000 ug | ORAL_TABLET | Freq: Every day | ORAL | 0 refills | Status: DC

## 2023-05-23 MED ORDER — ALPRAZOLAM 0.5 MG PO TABS: 0.5000 mg | ORAL_TABLET | Freq: Three times a day (TID) | ORAL | 0 refills | Status: DC | PRN

## 2023-05-23 MED ORDER — DICYCLOMINE HCL 10 MG PO CAPS: ORAL_CAPSULE | ORAL | 0 refills | Status: DC

## 2023-05-23 MED ORDER — VASCEPA 1 G PO CAPS
ORAL_CAPSULE | ORAL | 0 refills | Status: DC
Start: 2023-05-23 — End: 2023-08-02

## 2023-05-23 MED ORDER — FLUOCINONIDE 0.05 % EX CREA
1.0000 | TOPICAL_CREAM | Freq: Two times a day (BID) | CUTANEOUS | 1 refills | Status: DC
Start: 2023-05-23 — End: 2023-06-05

## 2023-05-23 NOTE — Progress Notes (Signed)
Subjective:   [name] is a 82 y.o. female who presents for Medicare Annual (Subsequent) preventive examination.  Visit Complete: Virtual I connected with  [name] on 05/23/23 by a audio enabled telemedicine application and verified that I am speaking with the correct person using two identifiers.  Patient Location: Home  Provider Location: Home Office  I discussed the limitations of evaluation and management by telemedicine. The patient expressed understanding and agreed to proceed.  Vital Signs: Because this visit was a virtual/telehealth visit, some criteria may be missing or patient reported. Any vitals not documented were not able to be obtained and vitals that have been documented are patient reported.    Cardiac Risk Factors include: dyslipidemia;advanced age (>76men, >36 women);hypertension;Other (see comment);obesity (BMI >30kg/m2), Risk factor comments: CAD,     Objective:    Today's Vitals   05/23/23 1414  Weight: 175 lb (79.4 kg)  Height: 5\' 3"  (1.6 m)   Body mass index is 31 kg/m.     05/23/2023    2:42 PM 06/02/2022    2:19 PM 06/01/2021   12:24 PM 12/02/2019    9:49 AM 07/06/2017    9:58 AM 04/11/2017   10:19 AM 02/28/2017    8:44 PM  Advanced Directives  Does Patient Have a Medical Advance Directive? Yes Yes Yes Yes No No Yes  Type of Estate agent of [place];Living will Healthcare Power of [place];Living will Living will;Healthcare Power of [employer] Power of [place];Living will   Living will  Does patient want to make changes to medical advance directive?   No - Patient declined No - Patient declined  Yes (ED - Information included in AVS) No - Patient declined  Copy of Healthcare Power of Attorney in Chart? No - copy requested No - copy requested No - copy requested No - copy requested     Would patient like information on creating a medical advance directive?     Yes (ED - Information included in AVS)       Current Medications (verified) Outpatient Encounter Medications as of 05/23/2023  Medication Sig   acetaminophen (TYLENOL) 500 MG tablet Take 500 mg by mouth every 6 (six) hours as needed.   ALPRAZolam (XANAX) 0.5 MG tablet TAKE 1 TABLET BY MOUTH THREE TIMES DAILY AS NEEDED FOR ANXIETY   brimonidine (ALPHAGAN) 0.15 % ophthalmic solution Place 1 drop into both eyes 3 (three) times daily.   dicyclomine (BENTYL) 10 MG capsule TAKE 1 CAPSULE BY MOUTH 4 TIMES DAILY BEFORE MEAL(S) AND AT BEDTIME   dorzolamide (TRUSOPT) 2 % ophthalmic solution Place 1 drop into both eyes 3 (three) times daily.   escitalopram (LEXAPRO) 20 MG tablet TAKE 1 TABLET BY MOUTH AT BEDTIME   fluocinonide cream (LIDEX) 0.05 % Apply 1 Application topically 2 (two) times daily.   furosemide (LASIX) 20 MG tablet TAKE 1 TABLET BY MOUTH IN THE MORNING   levothyroxine (SYNTHROID) 25 MCG tablet TAKE 1 TABLET BY MOUTH ONCE DAILY BEFORE BREAKFAST   Lifitegrast (XIIDRA) 5 % SOLN Apply 2 drops to eye 2 (two) times daily.   nystatin (MYCOSTATIN/NYSTOP) powder Apply 1 Application topically 3 (three) times daily.   potassium chloride SA (KLOR-CON M) 20 MEQ tablet Take 1 tablet by mouth twice daily   promethazine-dextromethorphan (PROMETHAZINE-DM) 6.25-15 MG/5ML syrup Take 5 mLs by mouth 3 (three) times daily as needed for cough.   VASCEPA 1 g capsule TAKE 2 CAPSULES BY MOUTH IN THE MORNING AND 2 AT BEDTIME  No facility-administered encounter medications on file as of 05/23/2023.    Allergies (verified) Diltiazem hcl, Statins, Zetia [ezetimibe], Codeine, Sulfonamide derivatives, Tape, and Tramadol   History: Past Medical History:  Diagnosis Date   Anxiety state, unspecified    CAD (coronary artery disease)    Disorder of bone and cartilage, unspecified    Diverticulosis of colon (without mention of hemorrhage)    Dizziness and giddiness    Fatty liver    Fibromyalgia    Obesity, unspecified    Other and unspecified  hyperlipidemia    Other chronic nonalcoholic liver disease    Skin cancer    basal cell on face   Stroke (HCC)    TIA   TIA (transient ischemic attack)    Unspecified essential hypertension    Past Surgical History:  Procedure Laterality Date   BACK SURGERY     CHOLECYSTECTOMY     MOHS SURGERY     TONSILLECTOMY AND ADENOIDECTOMY     TOTAL ABDOMINAL HYSTERECTOMY W/ BILATERAL SALPINGOOPHORECTOMY     Family History  Problem Relation Age of Onset   Stomach cancer Mother    Cancer Brother    Breast cancer Sister    Other Father    Breast cancer Sister    Colon cancer Neg Hx    Social History   Socioeconomic History   Marital status: Widowed    Spouse name: Viona Gilmore. Rowlette Sr.   Number of children: 4   Years of education: Not on file   Highest education level: Not on file  Occupational History    Employer: RETIRED  Tobacco Use   Smoking status: Never   Smokeless tobacco: Never  Vaping Use   Vaping status: Never Used  Substance and Sexual Activity   Alcohol use: No   Drug use: No   Sexual activity: Not Currently  Other Topics Concern   Not on file  Social History Narrative   Lives alone   Social Determinants of Health   Financial Resource Strain: Low Risk  (05/23/2023)   Overall Financial Resource Strain (CARDIA)    Difficulty of Paying Living Expenses: Not hard at all  Food Insecurity: No Food Insecurity (05/23/2023)   Hunger Vital Sign    Worried About Running Out of Food in the Last Year: Never true    Ran Out of Food in the Last Year: Never true  Transportation Needs: No Transportation Needs (05/23/2023)   PRAPARE - Administrator, Civil Service (Medical): No    Lack of Transportation (Non-Medical): No  Physical Activity: Inactive (05/23/2023)   Exercise Vital Sign    Days of Exercise per Week: 0 days    Minutes of Exercise per Session: 0 min  Stress: Stress Concern Present (05/23/2023)   [employer] of Occupational Health - Occupational  Stress Questionnaire    Feeling of Stress : To some extent  Social Connections: Moderately Isolated (05/23/2023)   Social Connection and Isolation Panel [NHANES]    Frequency of Communication with Friends and Family: More than three times a week    Frequency of Social Gatherings with Friends and Family: Once a week    Attends Religious Services: 1 to 4 times per year    Active Member of [employer] or Organizations: No    Attends Banker Meetings: Never    Marital Status: Widowed    Tobacco Counseling Counseling given: Not Answered   Clinical Intake:  Pre-visit preparation completed: Yes  Pain : No/denies pain  BMI - recorded: 31 Nutritional Status: BMI > 30  Obese Nutritional Risks: Nausea/ vomitting/ diarrhea Diabetes: No  How often do you need to have someone help you when you read instructions, pamphlets, or other written materials from your doctor or pharmacy?: 1 - Never  Interpreter Needed?: No  Information entered by :: Hessie Varone, RMA   Activities of Daily Living    05/23/2023    2:40 PM 06/02/2022    2:26 PM  In your present state of health, do you have any difficulty performing the following activities:  Hearing? 0 0  Vision? 0 0  Difficulty concentrating or making decisions? 0 0  Walking or climbing stairs? 0 0  Dressing or bathing? 0 0  Doing errands, shopping? 0 0  Preparing Food and eating ? N N  Using the Toilet? N N  In the past six months, have you accidently leaked urine? N N  Do you have problems with loss of bowel control? N N  Managing your Medications? N N  Managing your Finances? N N  Housekeeping or managing your Housekeeping? N N    Patient Care Team: Etta Grandchild, MD as PCP - General (Internal Medicine) Kathyrn Sheriff, [facility] (Inactive) as Pharmacist (Pharmacist) Sinda Du, MD as Consulting Physician (Ophthalmology)  Indicate any recent Medical Services you may have received from other than [facility] providers in  the past year (date may be approximate).     Assessment:   This is a routine wellness examination for [name].  Hearing/Vision screen Hearing Screening - Comments:: Denies hearing difficulties   Vision Screening - Comments:: WEARS eyeglasses   Goals Addressed             This Visit's Progress    Continue to take time for myself daily   On track    I will continue to read the bible and enjoy cooking daily. Enjoy life, family and worship God.       Depression Screen    05/23/2023    2:49 PM 02/17/2023    3:15 PM 11/24/2022    1:10 PM 06/27/2022    8:20 AM 06/02/2022    2:26 PM 06/01/2021   12:22 PM 10/29/2020    8:59 AM  PHQ 2/9 Scores  PHQ - 2 Score 0 0 0 0 0 0 0  PHQ- 9 Score 0          Fall Risk    05/23/2023    2:43 PM 02/17/2023    3:15 PM 11/24/2022    1:10 PM 06/27/2022    8:20 AM 06/02/2022    2:19 PM  Fall Risk   Falls in the past year? 1 0 0 0 1  Number falls in past yr: 1 0 0 0 1  Injury with Fall? 0 0 0 0 1  Risk for fall due to :  No Fall Risks No Fall Risks  History of fall(s);Impaired balance/gait;Orthopedic patient  Follow up Falls evaluation completed;Falls prevention discussed Falls evaluation completed Falls evaluation completed Falls evaluation completed Education provided;Falls prevention discussed    MEDICARE RISK AT HOME: Medicare Risk at Home Any stairs in or around the home?: No Home free of loose throw rugs in walkways, pet beds, electrical cords, etc?: Yes Adequate lighting in your home to reduce risk of falls?: Yes Life alert?: No Use of a cane, walker or w/c?: No Grab bars in the bathroom?: Yes Shower chair or bench in shower?: Yes Elevated toilet seat or a handicapped toilet?: Yes  TIMED UP AND GO:  Was the test performed?  No    Cognitive Function:    04/11/2017   10:27 AM  MMSE - Mini Mental State Exam  Orientation to time 5  Orientation to Place 5  Registration 3  Attention/ Calculation 5  Recall 2  Language- name 2  objects 2  Language- repeat 1  Language- follow 3 step command 3  Language- read & follow direction 1  Write a sentence 1  Copy design 1  Total score 29        05/23/2023    2:45 PM 06/02/2022    2:26 PM 12/02/2019    9:53 AM  6CIT Screen  What Year? 0 points 0 points 0 points  What month? 0 points 0 points 0 points  What time? 0 points 0 points 0 points  Count back from 20 0 points 0 points 0 points  Months in reverse 0 points 0 points 2 points  Repeat phrase 0 points 0 points 2 points  Total Score 0 points 0 points 4 points    Immunizations Immunization History  Administered Date(s) Administered   Fluad Quad(high Dose 65+) 02/27/2019, 02/25/2022   Fluad Trivalent(High Dose 65+) 03/10/2023   Influenza Split 03/27/2011, 03/30/2012   Influenza Whole 04/06/2009, 04/12/2010   Influenza, High Dose Seasonal PF 03/09/2017   Influenza,inj,Quad PF,6+ Mos 04/18/2013, 02/19/2014, 03/31/2015, 03/04/2016   Influenza-Unspecified 02/18/2018, 04/09/2020, 04/08/2021   Moderna Sars-Covid-2 Vaccination 07/03/2019, 07/31/2019, 04/10/2020   Pneumococcal Conjugate-13 03/18/2016   Pneumococcal Polysaccharide-23 03/21/2007, 08/03/2022   Tdap 10/17/2011, 10/04/2017   Zoster Recombinant(Shingrix) 12/16/2019, 03/18/2020    TDAP status: Up to date  Flu Vaccine status: Up to date  Pneumococcal vaccine status: Up to date  Covid-19 vaccine status: Declined, Education has been provided regarding the importance of this vaccine but patient still declined. Advised may receive this vaccine at local pharmacy or Health Dept.or vaccine clinic. Aware to provide a copy of the vaccination record if obtained from local pharmacy or Health Dept. Verbalized acceptance and understanding.  Qualifies for Shingles Vaccine? Yes   Zostavax completed Yes   Shingrix Completed?: Yes  Screening Tests Health Maintenance  Topic Date Due   COVID-19 Vaccine (4 - 2023-24 season) 06/08/2023 (Originally 02/19/2023)    Colonoscopy  08/04/2023 (Originally 05/02/2021)   Medicare Annual Wellness (AWV)  05/22/2024   DTaP/Tdap/Td (3 - Td or Tdap) 10/05/2027   Pneumonia Vaccine 10+ Years old  Completed   INFLUENZA VACCINE  Completed   Zoster Vaccines- Shingrix  Completed   HPV VACCINES  Aged Out   DEXA SCAN  Discontinued   Hepatitis C Screening  Discontinued    Health Maintenance  There are no preventive care reminders to display for this patient.   Colorectal cancer screening: No longer required.   Mammogram status: No longer required due to age.  Bone Density status: Ordered 05/23/2023. Pt provided with contact info and advised to call to schedule appt.  Lung Cancer Screening: (Low Dose CT Chest recommended if Age 47-80 years, 20 pack-year currently smoking OR have quit w/in 15years.) does not qualify.   Lung Cancer Screening Referral: N/A  Additional Screening:  Hepatitis C Screening: does qualify; Completed 12/02/2019  Vision Screening: Recommended annual ophthalmology exams for early detection of glaucoma and other disorders of the eye. Is the patient up to date with their annual eye exam?  Yes  Who is the provider or what is the name of the office in which the patient attends annual eye exams? Dr.  Bowen If pt is not established with a provider, would they like to be referred to a provider to establish care? No .   Dental Screening: Recommended annual dental exams for proper oral hygiene    Community Resource Referral / Chronic Care Management: CRR required this visit?  No   CCM required this visit?  No     Plan:     I have personally reviewed and noted the following in the patient's chart:   Medical and social history Use of alcohol, tobacco or illicit drugs  Current medications and supplements including opioid prescriptions. Patient is not currently taking opioid prescriptions. Functional ability and status Nutritional status Physical activity Advanced directives List of  other physicians Hospitalizations, surgeries, and ER visits in previous 12 months Vitals Screenings to include cognitive, depression, and falls Referrals and appointments  In addition, I have reviewed and discussed with patient certain preventive protocols, quality metrics, and best practice recommendations. A written personalized care plan for preventive services as well as general preventive health recommendations were provided to patient.     Larissa Pegg L Lashante Fryberger, CMA   05/23/2023   After Visit Summary: (MyChart) Due to this being a telephonic visit, the after visit summary with patients personalized plan was offered to patient via MyChart   Nurse Notes: Patient is due for a DEXA and orders have been placed.  She is requesting refills on all medications that are prescribed by Dr. Yetta Barre today.  Patient had no other concerns to address today.

## 2023-05-23 NOTE — Patient Instructions (Addendum)
[name] , Thank you for taking time to come for your Medicare Wellness Visit. I appreciate your ongoing commitment to your health goals. Please review the following plan we discussed and let me know if I can assist you in the future.   Referrals/Orders/Follow-Ups/Clinician Recommendations: It was nice talking with you today.  Wishing you a Very Merry Christmas.  Keep up the good work.   You have an order for:   []   Bone Density     Please call for appointment:  [facility] [address] [place], [place] [address] [phone]  Make sure to wear two-piece clothing.  No lotions, powders, or deodorants the day of the appointment. Make sure to bring picture ID and insurance card.  Bring list of medications you are currently taking including any supplements.   Schedule your [place] screening mammogram through MyChart!   Log into your MyChart account.  Go to 'Visit' (or 'Appointments' if on mobile App) --> Schedule an Appointment  Under 'Select a Reason for Visit' choose the Mammogram Screening option.  Complete the pre-visit questions and select the time and place that best fits your schedule.    This is a list of the screening recommended for you and due dates:  Health Maintenance  Topic Date Due   COVID-19 Vaccine (4 - 2023-24 season) 06/08/2023*   Colon Cancer Screening  08/04/2023*   Medicare Annual Wellness Visit  05/22/2024   DTaP/Tdap/Td vaccine (3 - Td or Tdap) 10/05/2027   Pneumonia Vaccine  Completed   Flu Shot  Completed   Zoster (Shingles) Vaccine  Completed   HPV Vaccine  Aged Out   DEXA scan (bone density measurement)  Discontinued   Hepatitis C Screening  Discontinued  *Topic was postponed. The date shown is not the original due date.    Advanced directives: (Copy Requested) Please bring a copy of your health care power of attorney and living will to the office to be added to your chart at your convenience.  Next Medicare  Annual Wellness Visit scheduled for next year: Yes

## 2023-06-05 ENCOUNTER — Encounter (HOSPITAL_COMMUNITY): Payer: Self-pay

## 2023-06-05 ENCOUNTER — Emergency Department (HOSPITAL_COMMUNITY): Payer: PPO

## 2023-06-05 ENCOUNTER — Other Ambulatory Visit: Payer: Self-pay

## 2023-06-05 ENCOUNTER — Inpatient Hospital Stay (HOSPITAL_COMMUNITY)
Admission: EM | Admit: 2023-06-05 | Discharge: 2023-06-07 | DRG: 322 | Disposition: A | Discharge status: Home / Self Care | Payer: PPO | Attending: Internal Medicine | Admitting: Internal Medicine

## 2023-06-05 DIAGNOSIS — Z885 Allergy status to narcotic agent status: Secondary | ICD-10-CM | POA: Diagnosis not present

## 2023-06-05 DIAGNOSIS — M899 Disorder of bone, unspecified: Secondary | ICD-10-CM | POA: Diagnosis not present

## 2023-06-05 DIAGNOSIS — F419 Anxiety disorder, unspecified: Secondary | ICD-10-CM | POA: Diagnosis present

## 2023-06-05 DIAGNOSIS — Z79890 Hormone replacement therapy (postmenopausal): Secondary | ICD-10-CM | POA: Diagnosis not present

## 2023-06-05 DIAGNOSIS — Z882 Allergy status to sulfonamides status: Secondary | ICD-10-CM | POA: Diagnosis not present

## 2023-06-05 DIAGNOSIS — E785 Hyperlipidemia, unspecified: Secondary | ICD-10-CM | POA: Diagnosis present

## 2023-06-05 DIAGNOSIS — I214 Non-ST elevation (NSTEMI) myocardial infarction: Secondary | ICD-10-CM | POA: Diagnosis not present

## 2023-06-05 DIAGNOSIS — Z85828 Personal history of other malignant neoplasm of skin: Secondary | ICD-10-CM

## 2023-06-05 DIAGNOSIS — F32A Depression, unspecified: Secondary | ICD-10-CM | POA: Diagnosis not present

## 2023-06-05 DIAGNOSIS — Z79899 Other long term (current) drug therapy: Secondary | ICD-10-CM

## 2023-06-05 DIAGNOSIS — Z9049 Acquired absence of other specified parts of digestive tract: Secondary | ICD-10-CM | POA: Diagnosis not present

## 2023-06-05 DIAGNOSIS — I2510 Atherosclerotic heart disease of native coronary artery without angina pectoris: Secondary | ICD-10-CM | POA: Diagnosis present

## 2023-06-05 DIAGNOSIS — E66811 Obesity, class 1: Secondary | ICD-10-CM | POA: Diagnosis not present

## 2023-06-05 DIAGNOSIS — M797 Fibromyalgia: Secondary | ICD-10-CM | POA: Diagnosis present

## 2023-06-05 DIAGNOSIS — E039 Hypothyroidism, unspecified: Secondary | ICD-10-CM | POA: Diagnosis present

## 2023-06-05 DIAGNOSIS — Z803 Family history of malignant neoplasm of breast: Secondary | ICD-10-CM

## 2023-06-05 DIAGNOSIS — E876 Hypokalemia: Secondary | ICD-10-CM | POA: Diagnosis not present

## 2023-06-05 DIAGNOSIS — D696 Thrombocytopenia, unspecified: Secondary | ICD-10-CM | POA: Diagnosis not present

## 2023-06-05 DIAGNOSIS — R7989 Other specified abnormal findings of blood chemistry: Secondary | ICD-10-CM

## 2023-06-05 DIAGNOSIS — Z8673 Personal history of transient ischemic attack (TIA), and cerebral infarction without residual deficits: Secondary | ICD-10-CM | POA: Diagnosis not present

## 2023-06-05 DIAGNOSIS — Z800 Family history of malignant neoplasm of digestive organs: Secondary | ICD-10-CM

## 2023-06-05 DIAGNOSIS — K760 Fatty (change of) liver, not elsewhere classified: Secondary | ICD-10-CM | POA: Diagnosis not present

## 2023-06-05 DIAGNOSIS — I10 Essential (primary) hypertension: Secondary | ICD-10-CM | POA: Diagnosis not present

## 2023-06-05 DIAGNOSIS — R0789 Other chest pain: Secondary | ICD-10-CM | POA: Diagnosis not present

## 2023-06-05 DIAGNOSIS — Z955 Presence of coronary angioplasty implant and graft: Secondary | ICD-10-CM

## 2023-06-05 DIAGNOSIS — Z6831 Body mass index (BMI) 31.0-31.9, adult: Secondary | ICD-10-CM | POA: Diagnosis not present

## 2023-06-05 DIAGNOSIS — R079 Chest pain, unspecified: Secondary | ICD-10-CM | POA: Diagnosis not present

## 2023-06-05 DIAGNOSIS — Z888 Allergy status to other drugs, medicaments and biological substances status: Secondary | ICD-10-CM

## 2023-06-05 DIAGNOSIS — R778 Other specified abnormalities of plasma proteins: Secondary | ICD-10-CM | POA: Diagnosis not present

## 2023-06-05 LAB — CBC
HCT: 39.3 % (ref 36.0–46.0)
Hemoglobin: 12.8 g/dL (ref 12.0–15.0)
MCH: 29.3 pg (ref 26.0–34.0)
MCHC: 32.6 g/dL (ref 30.0–36.0)
MCV: 89.9 fL (ref 80.0–100.0)
Platelets: 114 10*3/uL — ABNORMAL LOW (ref 150–400)
RBC: 4.37 MIL/uL (ref 3.87–5.11)
RDW: 13.3 % (ref 11.5–15.5)
WBC: 3.6 10*3/uL — ABNORMAL LOW (ref 4.0–10.5)
nRBC: 0 % (ref 0.0–0.2)

## 2023-06-05 LAB — LIPID PANEL
Cholesterol: 156 mg/dL (ref 0–200)
HDL: 30 mg/dL — ABNORMAL LOW (ref >40)
LDL Cholesterol: 108 mg/dL — ABNORMAL HIGH (ref 0–99)
Total CHOL/HDL Ratio: 5.2 {ratio}
Triglycerides: 92 mg/dL (ref <150)
VLDL: 18 mg/dL (ref 0–40)

## 2023-06-05 LAB — BASIC METABOLIC PANEL
Anion gap: 4 — ABNORMAL LOW (ref 5–15)
BUN: 10 mg/dL (ref 8–23)
CO2: 21 mmol/L — ABNORMAL LOW (ref 22–32)
Calcium: 6.8 mg/dL — ABNORMAL LOW (ref 8.9–10.3)
Chloride: 115 mmol/L — ABNORMAL HIGH (ref 98–111)
Creatinine, Ser: 0.8 mg/dL (ref 0.44–1.00)
GFR, Estimated: >60 mL/min (ref >60)
Glucose, Bld: 80 mg/dL (ref 70–99)
Potassium: 2.5 mmol/L — CL (ref 3.5–5.1)
Sodium: 140 mmol/L (ref 135–145)

## 2023-06-05 LAB — HEPARIN LEVEL (UNFRACTIONATED): Heparin Unfractionated: 0.33 [IU]/mL (ref 0.30–0.70)

## 2023-06-05 LAB — TROPONIN I (HIGH SENSITIVITY)
Troponin I (High Sensitivity): 41 ng/L — ABNORMAL HIGH (ref <18)
Troponin I (High Sensitivity): 145 ng/L (ref <18)
Troponin I (High Sensitivity): 647 ng/L (ref <18)
Troponin I (High Sensitivity): 918 ng/L (ref <18)

## 2023-06-05 LAB — MAGNESIUM: Magnesium: 2.1 mg/dL (ref 1.7–2.4)

## 2023-06-05 LAB — TSH: TSH: 2.718 u[IU]/mL (ref 0.350–4.500)

## 2023-06-05 MED ORDER — ACETAMINOPHEN 650 MG RE SUPP: 650.0000 mg | Freq: Four times a day (QID) | RECTAL | Status: DC | PRN

## 2023-06-05 MED ORDER — HEPARIN (PORCINE) 25000 UT/250ML-% IV SOLN
900.0000 [IU]/h | INTRAVENOUS | Status: DC
  Administered 2023-06-05 – 2023-06-06 (×2): 900 [IU]/h via INTRAVENOUS
  Filled 2023-06-05 (×2): qty 250

## 2023-06-05 MED ORDER — ESCITALOPRAM OXALATE 10 MG PO TABS
20.0000 mg | ORAL_TABLET | Freq: Every day | ORAL | Status: DC
  Administered 2023-06-05 – 2023-06-06 (×2): 20 mg via ORAL
  Filled 2023-06-05 (×2): qty 2

## 2023-06-05 MED ORDER — POLYETHYLENE GLYCOL 3350 17 G PO PACK: 17.0000 g | PACK | Freq: Every day | ORAL | Status: DC | PRN

## 2023-06-05 MED ORDER — ASPIRIN 81 MG PO TBEC
81.0000 mg | DELAYED_RELEASE_TABLET | Freq: Every day | ORAL | Status: DC
  Administered 2023-06-07: 81 mg via ORAL
  Filled 2023-06-05: qty 1

## 2023-06-05 MED ORDER — ICOSAPENT ETHYL 1 G PO CAPS
1.0000 g | ORAL_CAPSULE | Freq: Two times a day (BID) | ORAL | Status: DC
  Administered 2023-06-05 – 2023-06-07 (×5): 1 g via ORAL
  Filled 2023-06-05 (×8): qty 1

## 2023-06-05 MED ORDER — SODIUM CHLORIDE 0.9 % WEIGHT BASED INFUSION
1.0000 mL/kg/h | INTRAVENOUS | Status: DC
  Administered 2023-06-06: 1 mL/kg/h via INTRAVENOUS

## 2023-06-05 MED ORDER — POTASSIUM CHLORIDE CRYS ER 20 MEQ PO TBCR
20.0000 meq | EXTENDED_RELEASE_TABLET | Freq: Once | ORAL | Status: AC
  Administered 2023-06-05: 20 meq via ORAL
  Filled 2023-06-05: qty 1

## 2023-06-05 MED ORDER — POTASSIUM CHLORIDE 10 MEQ/100ML IV SOLN
10.0000 meq | INTRAVENOUS | Status: AC
  Administered 2023-06-05 (×2): 10 meq via INTRAVENOUS
  Filled 2023-06-05 (×2): qty 100

## 2023-06-05 MED ORDER — BISACODYL 5 MG PO TBEC: 5.0000 mg | DELAYED_RELEASE_TABLET | Freq: Every day | ORAL | Status: DC | PRN

## 2023-06-05 MED ORDER — HEPARIN BOLUS VIA INFUSION
4000.0000 [IU] | Freq: Once | INTRAVENOUS | Status: AC
  Administered 2023-06-05: 4000 [IU] via INTRAVENOUS
  Filled 2023-06-05: qty 4000

## 2023-06-05 MED ORDER — ALPRAZOLAM 0.5 MG PO TABS
0.5000 mg | ORAL_TABLET | Freq: Three times a day (TID) | ORAL | Status: DC | PRN
  Administered 2023-06-05 – 2023-06-06 (×2): 0.5 mg via ORAL
  Filled 2023-06-05 (×2): qty 1

## 2023-06-05 MED ORDER — ALBUTEROL SULFATE (2.5 MG/3ML) 0.083% IN NEBU: 2.5000 mg | INHALATION_SOLUTION | Freq: Four times a day (QID) | RESPIRATORY_TRACT | Status: DC | PRN

## 2023-06-05 MED ORDER — NITROGLYCERIN 0.4 MG SL SUBL: 0.4000 mg | SUBLINGUAL_TABLET | SUBLINGUAL | Status: AC | PRN

## 2023-06-05 MED ORDER — SODIUM CHLORIDE 0.9 % WEIGHT BASED INFUSION
3.0000 mL/kg/h | INTRAVENOUS | Status: DC
  Administered 2023-06-06: 3 mL/kg/h via INTRAVENOUS

## 2023-06-05 MED ORDER — HYDRALAZINE HCL 20 MG/ML IJ SOLN: 10.0000 mg | Freq: Four times a day (QID) | INTRAMUSCULAR | Status: DC | PRN

## 2023-06-05 MED ORDER — POTASSIUM CHLORIDE CRYS ER 20 MEQ PO TBCR
40.0000 meq | EXTENDED_RELEASE_TABLET | Freq: Once | ORAL | Status: AC
  Administered 2023-06-05: 40 meq via ORAL
  Filled 2023-06-05: qty 2

## 2023-06-05 MED ORDER — ACETAMINOPHEN 325 MG PO TABS
650.0000 mg | ORAL_TABLET | Freq: Four times a day (QID) | ORAL | Status: DC | PRN
  Administered 2023-06-06: 650 mg via ORAL
  Filled 2023-06-05: qty 2

## 2023-06-05 MED ORDER — LEVOTHYROXINE SODIUM 25 MCG PO TABS
25.0000 ug | ORAL_TABLET | Freq: Every day | ORAL | Status: DC
  Administered 2023-06-06 – 2023-06-07 (×2): 25 ug via ORAL
  Filled 2023-06-05 (×2): qty 1

## 2023-06-05 MED ORDER — ASPIRIN 81 MG PO CHEW
81.0000 mg | CHEWABLE_TABLET | ORAL | Status: AC
  Administered 2023-06-06: 81 mg via ORAL
  Filled 2023-06-05: qty 1

## 2023-06-05 NOTE — ED Notes (Signed)
Pt reported jaw pain 3/10 that has returned from earlier, radiating down to chest. EKG shot at time of complaint, recent troponin drawn currently in progress, paged Attending, Dr. Janalyn Shy responded to page read EKG and is ok with all at moment. Pt remains on heparin.

## 2023-06-05 NOTE — Progress Notes (Signed)
PHARMACY - ANTICOAGULATION CONSULT NOTE  Pharmacy Consult for heparin Indication: chest pain/ACS  Allergies  Allergen Reactions   Diltiazem Hcl Other (See Comments)    REACTION: pt states it made her dizzy...   Statins     Muscle aches   Zetia [Ezetimibe]     "it made me feel bad"   Codeine Nausea Only   Sulfonamide Derivatives Hives, Itching and Rash   Tape Itching   Tramadol Itching    Patient Measurements: Weight: 81.2 kg (179 lb 1.6 oz) Heparin Dosing Weight: 69kg  Vital Signs: Temp: 98.3 F (36.8 C) (12/16 2200) Temp Source: Oral (12/16 2200) BP: 140/64 (12/16 2200) Pulse Rate: 61 (12/16 2200)  Labs: Recent Labs    06/05/23 1048 06/05/23 1248 06/05/23 1733 06/05/23 2000 06/05/23 2150  HGB 12.8  --   --   --   --   HCT 39.3  --   --   --   --   PLT 114*  --   --   --   --   HEPARINUNFRC  --   --   --   --  0.33  CREATININE 0.80  --   --   --   --   TROPONINIHS 41* 145* 647* 918*  --     Estimated Creatinine Clearance: 54.7 mL/min (by C-G formula based on SCr of 0.8 mg/dL).   Medical History: Past Medical History:  Diagnosis Date   Anxiety state, unspecified    CAD (coronary artery disease)    Disorder of bone and cartilage, unspecified    Diverticulosis of colon (without mention of hemorrhage)    Dizziness and giddiness    Fatty liver    Fibromyalgia    Obesity, unspecified    Other and unspecified hyperlipidemia    Other chronic nonalcoholic liver disease    Skin cancer    basal cell on face   Stroke (HCC)    TIA   TIA (transient ischemic attack)    Unspecified essential hypertension     Assessment: 6 YOF presenting with CP, she is not on anticoagulation PTA, H/H wnl, plts 114.  Heparin level is therapeutic at 0.33 after initial bolus and rate of 900 units/hr.   Goal of Therapy:  Heparin level 0.3-0.7 units/ml Monitor platelets by anticoagulation protocol: Yes   Plan:  Continue heparin at 900 units/hr Follow-up AM level F/u cards  eval and recs  Link Snuffer, PharmD, BCPS, BCCCP Please refer to [facility] for [facility] numbers 06/05/2023 10:29 PM

## 2023-06-05 NOTE — Consult Note (Addendum)
Cardiology Consultation   Patient ID: ELBA CRUCE MRN: [id number]; DOB: 11-Jul-1940  Admit date: 06/05/2023 Date of Consult: 06/05/2023  PCP:  Etta Grandchild, MD   [facility] Providers Cardiologist:  None        Patient Profile:   [name] is a 82 y.o. female with a hx of hyperlipidemia, hypothyroidism, LE edema  who is being seen 06/05/2023 for the evaluation of chest pain at the request of Dr. Renaye Rakers.  History of Present Illness:   Ms. Debiasi presented to the ED today with EMS after developing chest pressure shortly after dropping off her granddaughter. Pain radiated into her jaw and left arm and was associated with nausea. Pain rated at 10/10 in severity. She was able to drive to her daughter's house but felt too weak to get out of the car. EMS was activated. EMS administered nitroglycerin and ASA and patient had complete resolution of pain prior to arrival. Per notes, EMS concerned about ST elevation in inferior leads. Patient remains chest pain free at this time. She reports an isolated episode of rapid/pounding HR last week. She also notes unusual fatigue and shortness of breath recently when she was in a position to walk quickly and reports general observation of lower exertional tolerance over the past several weeks. Recalls feeling disproportionately tired after walking up the stairs in the [facility] for a recent show. Otherwise has been feeling well and denies orthopnea, acute LE edema, illness.  Per chart review, patient saw her PCP in July of 2023 and reported chest pain and dyspnea with pain radiating into her jaw. This prompted a myoview stress test that was deemed normal/low risk.   Past Medical History:  Diagnosis Date   Anxiety state, unspecified    CAD (coronary artery disease)    Disorder of bone and cartilage, unspecified    Diverticulosis of colon (without mention of hemorrhage)    Dizziness and giddiness    Fatty liver    Fibromyalgia     Obesity, unspecified    Other and unspecified hyperlipidemia    Other chronic nonalcoholic liver disease    Skin cancer    basal cell on face   Stroke (HCC)    TIA   TIA (transient ischemic attack)    Unspecified essential hypertension     Past Surgical History:  Procedure Laterality Date   BACK SURGERY     CHOLECYSTECTOMY     MOHS SURGERY     TONSILLECTOMY AND ADENOIDECTOMY     TOTAL ABDOMINAL HYSTERECTOMY W/ BILATERAL SALPINGOOPHORECTOMY       Home Medications:  Prior to Admission medications   Medication Sig Start Date End Date Taking? Authorizing Provider  ALPRAZolam Prudy Feeler) 0.5 MG tablet Take 1 tablet (0.5 mg total) by mouth 3 (three) times daily as needed. for anxiety 05/23/23  Yes Etta Grandchild, MD  brimonidine (ALPHAGAN P) 0.1 % SOLN Place 1 drop into both eyes 4 (four) times daily. 05/17/23  Yes [provider]  dicyclomine (BENTYL) 10 MG capsule TAKE 1 CAPSULE BY MOUTH 4 TIMES DAILY BEFORE MEAL(S) AND AT BEDTIME 05/23/23  Yes Etta Grandchild, MD  escitalopram (LEXAPRO) 20 MG tablet Take 1 tablet (20 mg total) by mouth at bedtime. 05/23/23  Yes Etta Grandchild, MD  furosemide (LASIX) 20 MG tablet TAKE 1 TABLET BY MOUTH IN THE MORNING 05/20/23  Yes Etta Grandchild, MD  levothyroxine (SYNTHROID) 25 MCG tablet Take 1 tablet (25 mcg total) by mouth daily  before breakfast. 05/23/23  Yes Etta Grandchild, MD  Lifitegrast Benay Spice) 5 % SOLN Apply 2 drops to eye 2 (two) times daily.   Yes [provider]  nystatin (MYCOSTATIN/NYSTOP) powder Apply 1 Application topically 3 (three) times daily. 11/24/22  Yes Elenore Paddy, NP  potassium chloride SA (KLOR-CON M) 20 MEQ tablet Take 1 tablet by mouth twice daily 01/27/23  Yes Etta Grandchild, MD  VASCEPA 1 g capsule TAKE 2 CAPSULES BY MOUTH IN THE MORNING AND 2 AT BEDTIME Patient taking differently: Take 1 g by mouth 2 (two) times daily. TAKE 1 CAPSULES BY MOUTH IN THE MORNING AND 1 AT BEDTIME 05/23/23  Yes Etta Grandchild, MD   acetaminophen (TYLENOL) 500 MG tablet Take 500 mg by mouth every 6 (six) hours as needed. Patient not taking: Reported on 06/05/2023    [provider]    Inpatient Medications: Scheduled Meds:  escitalopram  20 mg Oral QHS   icosapent Ethyl  1 g Oral BID   [START ON 06/06/2023] levothyroxine  25 mcg Oral Q0600   potassium chloride  40 mEq Oral Once   Continuous Infusions:  heparin 900 Units/hr (06/05/23 1353)   PRN Meds: acetaminophen **OR** acetaminophen, albuterol, ALPRAZolam, bisacodyl, hydrALAZINE, polyethylene glycol  Allergies:    Allergies  Allergen Reactions   Diltiazem Hcl Other (See Comments)    REACTION: pt states it made her dizzy...   Statins     Muscle aches   Zetia [Ezetimibe]     "it made me feel bad"   Codeine Nausea Only   Sulfonamide Derivatives Hives, Itching and Rash   Tape Itching   Tramadol Itching    Social History:   Social History   Socioeconomic History   Marital status: Widowed    Spouse name: Viona Gilmore. Lacerte Sr.   Number of children: 4   Years of education: Not on file   Highest education level: Not on file  Occupational History    Employer: RETIRED  Tobacco Use   Smoking status: Never   Smokeless tobacco: Never  Vaping Use   Vaping status: Never Used  Substance and Sexual Activity   Alcohol use: No   Drug use: No   Sexual activity: Not Currently  Other Topics Concern   Not on file  Social History Narrative   Lives alone   Social Drivers of Health   Financial Resource Strain: Low Risk  (05/23/2023)   Overall Financial Resource Strain (CARDIA)    Difficulty of Paying Living Expenses: Not hard at all  Food Insecurity: No Food Insecurity (06/05/2023)   Hunger Vital Sign    Worried About Running Out of Food in the Last Year: Never true    Ran Out of Food in the Last Year: Never true  Transportation Needs: No Transportation Needs (06/05/2023)   PRAPARE - Administrator, Civil Service (Medical): No     Lack of Transportation (Non-Medical): No  Physical Activity: Inactive (05/23/2023)   Exercise Vital Sign    Days of Exercise per Week: 0 days    Minutes of Exercise per Session: 0 min  Stress: Stress Concern Present (05/23/2023)   [employer] of Occupational Health - Occupational Stress Questionnaire    Feeling of Stress : To some extent  Social Connections: Moderately Isolated (05/23/2023)   Social Connection and Isolation Panel [NHANES]    Frequency of Communication with Friends and Family: More than three times a week    Frequency of Social Gatherings with  Friends and Family: Once a week    Attends Religious Services: 1 to 4 times per year    Active Member of [employer] or Organizations: No    Attends Banker Meetings: Never    Marital Status: Widowed  Intimate Partner Violence: Not At Risk (06/05/2023)   Humiliation, Afraid, Rape, and Kick questionnaire    Fear of Current or Ex-Partner: No    Emotionally Abused: No    Physically Abused: No    Sexually Abused: No    Family History:    Family History  Problem Relation Age of Onset   Stomach cancer Mother    Cancer Brother    Breast cancer Sister    Other Father    Breast cancer Sister    Colon cancer Neg Hx      ROS:  Please see the history of present illness.   All other ROS reviewed and negative.     Physical Exam/Data:   Vitals:   06/05/23 1040 06/05/23 1200  BP: (!) 142/64 138/68  Pulse: (!) 55 (!) 54  Resp: 11 16  Temp: 98.2 F (36.8 C)   TempSrc: Oral   SpO2: 100% 97%    Intake/Output Summary (Last 24 hours) at 06/05/2023 1505 Last data filed at 06/05/2023 1348 Gross per 24 hour  Intake 100 ml  Output --  Net 100 ml      05/23/2023    2:14 PM 04/07/2023    9:53 AM 03/22/2023    9:14 AM  Last 3 Weights  Weight (lbs) 175 lb 175 lb 177 lb 12.8 oz  Weight (kg) 79.379 kg 79.379 kg 80.65 kg     There is no height or weight on file to calculate BMI.  General:  Well nourished, well  developed, in no acute distress HEENT: normal Neck: no JVD Vascular: No carotid bruits; Distal pulses 2+ bilaterally Cardiac:  normal S1, S2; bradycardic rate; no murmur  Lungs:  clear to auscultation bilaterally, no wheezing, rhonchi or rales  Abd: soft, nontender, no hepatomegaly  Ext: no edema Musculoskeletal:  No deformities, BUE and BLE strength normal and equal Skin: warm and dry  Neuro:  CNs 2-12 intact, no focal abnormalities noted Psych:  Normal affect   EKG:  The EKG was personally reviewed and demonstrates:  sinus bradycardia with first degree AVB Telemetry:  Telemetry was personally reviewed and demonstrates:  sinus rhythm with 1st degree AVB  Relevant CV Studies:  MYOCARDIAL PERFUSION IMAGING   Result Date: 01/18/2022   The study is normal. The study is low risk.   No ST deviation was noted.   LV perfusion is normal.   Left ventricular function is normal. Nuclear stress EF: 52 %. The left ventricular ejection fraction is mildly decreased (45-54%). End diastolic cavity size is normal.   Prior study available for comparison from 09/05/2013. No changes compared to prior study. Low risk stress nuclear study with normal perfusion and borderline reduction in left ventricular global systolic function. LVEF is lower - recommend correlation with echo.   Laboratory Data:  High Sensitivity Troponin:   Recent Labs  Lab 06/05/23 1048  TROPONINIHS 41*     Chemistry Recent Labs  Lab 06/05/23 1048 06/05/23 1248  NA 140  --   K 2.5*  --   CL 115*  --   CO2 21*  --   GLUCOSE 80  --   BUN 10  --   CREATININE 0.80  --   CALCIUM 6.8*  --  MG  --  2.1  GFRNONAA >60  --   ANIONGAP 4*  --     No results for input(s): "PROT", "ALBUMIN", "AST", "ALT", "ALKPHOS", "BILITOT" in the last 168 hours. Lipids  Recent Labs  Lab 06/05/23 1048  CHOL 156  TRIG 92  HDL 30*  LDLCALC 108*  CHOLHDL 5.2    Hematology Recent Labs  Lab 06/05/23 1048  WBC 3.6*  RBC 4.37  HGB 12.8  HCT  39.3  MCV 89.9  MCH 29.3  MCHC 32.6  RDW 13.3  PLT 114*   Thyroid  Recent Labs  Lab 06/05/23 1048  TSH 2.718    BNPNo results for input(s): "BNP", "PROBNP" in the last 168 hours.  DDimer No results for input(s): "DDIMER" in the last 168 hours.   Radiology/Studies:  DG Chest Portable 1 View Result Date: 06/05/2023 CLINICAL DATA:  Chest discomfort this morning. EXAM: PORTABLE CHEST 1 VIEW COMPARISON:  Radiographs 05/24/2022 and 10/13/2021. Chest CT 07/06/2017. FINDINGS: 1151 hours. Lordotic positioning and mild patient rotation to the right. The heart size and mediastinal contours are stable. The lungs appear clear. No pleural effusion or pneumothorax. No acute osseous findings are seen. Telemetry leads overlie the chest. IMPRESSION: No evidence of acute cardiopulmonary process. Electronically Signed   By: Carey Bullocks M.D.   On: 06/05/2023 12:43     Assessment and Plan:   Chest pain concerning for unstable angina  Patient developed chest pain while at rest this morning. Pain radiated into her jaw and left should/arm. Later also had radiation into her thoracic back. Pain was associated with nausea and acute weakness. EMS administered sublingual nitroglycerin and ASA 324mg  and patient is now chest pain free. ECG without acute ischemia. Initial troponin 41. Of note, underwent myoview stress test in 2023 (ordered by PCP). No ischemia per this study but LVEF was found to be decreased from prior to 45-54%.   Patient started on heparin for suspected ACS with concerning constellation of symptoms this morning. Repeat troponin is pending. If flat, would plan to proceed with cardiac CTA. If significantly elevated or if TTE reveals RWMA, we would need to plan for [facility] tomorrow.  Check echocardiogram given new chest pain with 2023 Myoview suggesting decreased LVEF from prior Continue daily ASA 81mg .   LE edema  By patient description of chronic LE edema, not sure that she needs daily lasix  especially given hx chronic hypokalemia. Appears euvolemic with no evidence of acute CHF. Hold further lasix and assess LVEF with TTE. If echo normal, would consider outpatient LE venous US to assess for venous reflux.    Hypokalemia  Continue replacement per primary team to maintain K>4, Mg>2.   Hyperlipidemia  Patient on Vascepa 2g BID and has LDL 108. Intolerant to statins with myalgias. Would consider outpatient initiation of PCSK9 for optimal management of hyperlipidemia.   Hypothyroidism  Per primary team.  Risk Assessment/Risk Scores:     TIMI Risk Score for Unstable Angina or Non-ST Elevation MI:   The patient's TIMI risk score is 2, which indicates a 8% risk of all cause mortality, new or recurrent myocardial infarction or need for urgent revascularization in the next 14 days.  New York Heart Association (NYHA) Functional Class NYHA Class II        For questions or updates, please contact [facility] Please consult [email or web address] for contact info under    Signed, Perlie Gold, PA-C  06/05/2023 3:05 PM   ATTENDING ATTESTATION:  After conducting a review  of all available clinical information with the care team, interviewing the patient, and performing a physical exam, I agree with the findings and plan described in this note.   GEN: No acute distress.   HEENT:  MMM, no JVD, no scleral icterus Cardiac: RRR, no murmurs, rubs, or gallops.  Respiratory: Clear to auscultation bilaterally. GI: Soft, nontender, non-distended  MS: No edema; No deformity. Neuro:  Nonfocal  Vasc:  +2 radial pulses  Patient is an 82 year old female with a history of remote TIA, hypertension, hyperlipidemia, and hypothyroidism who lives by herself and does all of her activities of daily living.  She was in her normal state of health up until earlier today.  She was dropping her granddaughter off at school which she does on a daily basis when she developed acute onset chest  discomfort accompanied by nausea.  She denies any antecedent exertional angina but does get dyspneic at times when she walks.  She has had previous stress testing which has been negative.  She is asymptomatic currently.  Her troponins are only mildly elevated.  Will obtain an echocardiogram and trend troponins.  If the troponins are highly elevated, echocardiogram demonstrates LV dysfunction, or patient has recurrent episode of worrisome chest discomfort will refer for coronary angiography.  Will make the patient n.p.o. for the study tomorrow.  However if echocardiogram, troponins, and clinical course are reassuring will refer for coronary CTA.  Transient ST elevations inferiorly per report have completely resolved.  This may represent coronary vasospasm (if cath or CTA negative, consider CCB).  Alverda Skeans, MD Pager [phone]

## 2023-06-05 NOTE — ED Notes (Signed)
Per lab, Troponin 647, Dahal, MD notified.

## 2023-06-05 NOTE — ED Provider Notes (Signed)
[place] EMERGENCY DEPARTMENT AT [facility] Provider Note   CSN: [id number] Arrival date & time: 06/05/23  1031     History  Chief Complaint  Patient presents with   Chest Pain    [name] is a 82 y.o. female presenting by EMS with concern for chest discomfort.  The patient reports that she was dropping off her granddaughter today and was at rest around 9 AM when she began to have pressure in her chest rating between her shoulder blades.  She felt nauseated with this.  Pain radiated down her left arm and into her jaw.  She called EMS to give the patient 0.4 mg of nitroglycerin and 3 to 24 mg aspirin, with complete resolution of her symptoms.  EMS reported concern for possible ST elevation in inferior leads en route to the hospital.  HPI     Home Medications Prior to Admission medications   Medication Sig Start Date End Date Taking? Authorizing Provider  acetaminophen (TYLENOL) 500 MG tablet Take 500 mg by mouth every 6 (six) hours as needed.    [provider]  ALPRAZolam Prudy Feeler) 0.5 MG tablet Take 1 tablet (0.5 mg total) by mouth 3 (three) times daily as needed. for anxiety 05/23/23   Etta Grandchild, MD  brimonidine (ALPHAGAN) 0.15 % ophthalmic solution Place 1 drop into both eyes 3 (three) times daily. 01/20/17   [provider]  dicyclomine (BENTYL) 10 MG capsule TAKE 1 CAPSULE BY MOUTH 4 TIMES DAILY BEFORE MEAL(S) AND AT BEDTIME 05/23/23   Etta Grandchild, MD  dorzolamide (TRUSOPT) 2 % ophthalmic solution Place 1 drop into both eyes 3 (three) times daily. 02/06/17   [provider]  escitalopram (LEXAPRO) 20 MG tablet Take 1 tablet (20 mg total) by mouth at bedtime. 05/23/23   Etta Grandchild, MD  fluocinonide cream (LIDEX) 0.05 % Apply 1 Application topically 2 (two) times daily. 05/23/23   Etta Grandchild, MD  furosemide (LASIX) 20 MG tablet TAKE 1 TABLET BY MOUTH IN THE MORNING 05/20/23   Etta Grandchild, MD  levothyroxine (SYNTHROID) 25  MCG tablet Take 1 tablet (25 mcg total) by mouth daily before breakfast. 05/23/23   Etta Grandchild, MD  Lifitegrast Benay Spice) 5 % SOLN Apply 2 drops to eye 2 (two) times daily.    [provider]  nystatin (MYCOSTATIN/NYSTOP) powder Apply 1 Application topically 3 (three) times daily. 11/24/22   Elenore Paddy, NP  potassium chloride SA (KLOR-CON M) 20 MEQ tablet Take 1 tablet by mouth twice daily 01/27/23   Etta Grandchild, MD  VASCEPA 1 g capsule TAKE 2 CAPSULES BY MOUTH IN THE MORNING AND 2 AT BEDTIME 05/23/23   Etta Grandchild, MD      Allergies    Diltiazem hcl, Statins, Zetia [ezetimibe], Codeine, Sulfonamide derivatives, Tape, and Tramadol    Review of Systems   Review of Systems  Physical Exam Updated Vital Signs BP (!) 142/64   Pulse (!) 55   Temp 98.2 F (36.8 C) (Oral)   Resp 11   SpO2 100%  Physical Exam Constitutional:      General: She is not in acute distress. HENT:     Head: Normocephalic and atraumatic.  Eyes:     Conjunctiva/sclera: Conjunctivae normal.     Pupils: Pupils are equal, round, and reactive to light.  Cardiovascular:     Rate and Rhythm: Normal rate and regular rhythm.  Pulmonary:     Effort: Pulmonary  effort is normal. No respiratory distress.  Abdominal:     General: There is no distension.     Tenderness: There is no abdominal tenderness.  Skin:    General: Skin is warm and dry.  Neurological:     General: No focal deficit present.     Mental Status: She is alert. Mental status is at baseline.  Psychiatric:        Mood and Affect: Mood normal.        Behavior: Behavior normal.     ED Results / Procedures / Treatments   Labs (all labs ordered are listed, but only abnormal results are displayed) Labs Reviewed  BASIC METABOLIC PANEL - Abnormal; Notable for the following components:      Result Value   Potassium 2.5 (*)    Chloride 115 (*)    CO2 21 (*)    Calcium 6.8 (*)    Anion gap 4 (*)    All other components within normal  limits  CBC - Abnormal; Notable for the following components:   WBC 3.6 (*)    Platelets 114 (*)    All other components within normal limits  LIPID PANEL - Abnormal; Notable for the following components:   HDL 30 (*)    LDL Cholesterol 108 (*)    All other components within normal limits  TROPONIN I (HIGH SENSITIVITY) - Abnormal; Notable for the following components:   Troponin I (High Sensitivity) 41 (*)    All other components within normal limits  MAGNESIUM  HEPARIN LEVEL (UNFRACTIONATED)  TROPONIN I (HIGH SENSITIVITY)    EKG EKG Interpretation Date/Time:  Monday June 05 2023 10:41:59 EST Ventricular Rate:  57 PR Interval:  213 QRS Duration:  106 QT Interval:  490 QTC Calculation: 478 R Axis:   -41  Text Interpretation: Sinus rhythm Borderline prolonged PR interval Borderline T wave abnormalities Confirmed by Alvester Chou [phone]) on 06/05/2023 10:49:17 AM  Radiology No results found.  Procedures .Critical Care  Performed by: Terald Sleeper, MD Authorized by: Terald Sleeper, MD   Critical care provider statement:    Critical care time (minutes):  45   Critical care time was exclusive of:  Separately billable procedures and treating other patients   Critical care was necessary to treat or prevent imminent or life-threatening deterioration of the following conditions:  Circulatory failure and metabolic crisis   Critical care was time spent personally by me on the following activities:  Ordering and performing treatments and interventions, ordering and review of laboratory studies, ordering and review of radiographic studies, pulse oximetry, review of old charts, examination of patient and evaluation of patient's response to treatment   Care discussed with: admitting provider   Comments:     IV potassium repletion for hypokalemia, IV heparin for NSTEMI     Medications Ordered in ED Medications  potassium chloride 10 mEq in 100 mL IVPB (10 mEq Intravenous  New Bag/Given 06/05/23 1230)  heparin bolus via infusion 4,000 Units (has no administration in time range)  heparin ADULT infusion 100 units/mL (25000 units/252mL) (has no administration in time range)  potassium chloride SA (KLOR-CON M) CR tablet 20 mEq (20 mEq Oral Given 06/05/23 1223)    ED Course/ Medical Decision Making/ A&P Clinical Course as of 06/05/23 1237  Mon Jun 05, 2023  1053 Field ecg's from EMS reviewed. It is not clear to me that there was persistent ST elevation in inferior leads meeting STEMI criteria.  There is significant baseline wander  on the two ECG's I was shown, with motion artifact, and no reciprocal lead changes or ST depressions. [MT]  1212 Potassium repletion ordered.  Initial troponin only mildly elevated at 41 but the patient will need repeat troponin level.  I will consult with cardiology [MT]  1228 Cardiology consulted, recommending initiation of heparin with his history and they will see the patient, recommending troponin trending, rec medical admission for management of electrolyte derangement while they evaluate the patient from a coronary perspective [MT]  1235 Patient reports she has been told she has hypokalemia in the past and she takes 20 mill equivalents of potassium twice daily.  She is chronically on hydrochlorothiazide per her report [MT]    Clinical Course User Index [MT] Stark Aguinaga, Kermit Balo, MD                                 Medical Decision Making Amount and/or Complexity of Data Reviewed Labs: ordered. Radiology: ordered. ECG/medicine tests: ordered.  Risk Prescription drug management. Decision regarding hospitalization.   This patient presents to the ED with concern for chest pain and nausea, now resolved. This involves an extensive number of treatment options, and is a complaint that carries with it a high risk of complications and morbidity.  The differential diagnosis includes ACS versus viral illness versus metabolic derangement  versus other  Co-morbidities that complicate the patient evaluation: History of high blood pressure and age and risk factors for cardiovascular disease  Additional history obtained from EMS   I ordered and personally interpreted labs.  The pertinent results include: Potassium 2.5.  Initial troponin 41.  Repeat troponin is pending  I ordered imaging studies including x-ray of the chest I independently visualized and interpreted imaging which showed no emergent findings I agree with the radiologist interpretation  The patient was maintained on a cardiac monitor.  I personally viewed and interpreted the cardiac monitored which showed an underlying rhythm of: Sinus rhythm  Per my interpretation the patient's ECG shows sinus rhythm with no significant ST elevations.  For my review there is some extremely minimal ST elevation in the inferior leads but it does not meet STEMI criteria.  I ordered medication including IV potassium and oral potassium repletion.  IV heparin for NSTEMI.  Note that aspirin 324 mg given by EMS.  No active chest pain in ED.  I have reviewed the patients home medicines and have made adjustments as needed  Test Considered: Low suspicion for acute PE in this clinical setting  I requested consultation with the cardiology,  and discussed lab and imaging findings as well as pertinent plan - they recommend: IV heparin, trending troponins, they will consult the patient were recommending medical admission for electrolyte management  After the interventions noted above, I reevaluated the patient and found that they have: improved  My reassessment the patient remains asymptomatic no active chest pain at the 2-hour mark, prior to medical admission.  Her daughter was also present to be updated about workups and plan for admission.  Both are in agreement  Dispostion:  After consideration of the diagnostic results and the patients response to treatment, I feel that the patent would  benefit from medical admission.         Final Clinical Impression(s) / ED Diagnoses Final diagnoses:  Chest pain, unspecified type  Elevated troponin  Hypokalemia    Rx / DC Orders ED Discharge Orders     None  Terald Sleeper, MD 06/05/23 [phone]

## 2023-06-05 NOTE — Progress Notes (Signed)
PHARMACY - ANTICOAGULATION CONSULT NOTE  Pharmacy Consult for heparin Indication: chest pain/ACS  Allergies  Allergen Reactions   Diltiazem Hcl Other (See Comments)    REACTION: pt states it made her dizzy...   Statins     Muscle aches   Zetia [Ezetimibe]     "it made me feel bad"   Codeine Nausea Only   Sulfonamide Derivatives Hives, Itching and Rash   Tape Itching   Tramadol Itching    Patient Measurements:   Heparin Dosing Weight: 69kg  Vital Signs: Temp: 98.2 F (36.8 C) (12/16 1040) Temp Source: Oral (12/16 1040) BP: 142/64 (12/16 1040) Pulse Rate: 55 (12/16 1040)  Labs: Recent Labs    06/05/23 1048  HGB 12.8  HCT 39.3  PLT 114*  CREATININE 0.80  TROPONINIHS 41*    Estimated Creatinine Clearance: 54.1 mL/min (by C-G formula based on SCr of 0.8 mg/dL).   Medical History: Past Medical History:  Diagnosis Date   Anxiety state, unspecified    CAD (coronary artery disease)    Disorder of bone and cartilage, unspecified    Diverticulosis of colon (without mention of hemorrhage)    Dizziness and giddiness    Fatty liver    Fibromyalgia    Obesity, unspecified    Other and unspecified hyperlipidemia    Other chronic nonalcoholic liver disease    Skin cancer    basal cell on face   Stroke (HCC)    TIA   TIA (transient ischemic attack)    Unspecified essential hypertension     Assessment: 75 YOF presenting with CP, she is not on anticoagulation PTA, H/H wnl, plts 114  Goal of Therapy:  Heparin level 0.3-0.7 units/ml Monitor platelets by anticoagulation protocol: Yes   Plan:  Heparin 4000 units Iv x 1, and gtt at 900 units/hr F/u 8 hour heparin level F/u cards eval and recs  Daylene Posey, PharmD, [facility] Clinical Pharmacist ED Pharmacist Phone # [phone] 06/05/2023 12:30 PM

## 2023-06-05 NOTE — H&P (Addendum)
[facility] History and Physical  [name] JXB:[id number] DOB: July 04, 1940 DOA: 06/05/2023 PCP: Etta Grandchild, MD  Presented from: home Chief Complaint: chest pain     History of Present Illness: [name] is a 82 y.o. female with PMH significant for HTN, HLD, statin intolerance, TIA, chronic hypokalemia, hypothyroidism, diverticulosis, anxiety. Patient was brought to the ED by EMS today with concern of chest discomfort. Patient was reportedly dropping off her granddaughter today around 60 AM when she felt chest pressure radiating between her shoulder blades, left arm and jaw, associated with nausea. She called EMS.  EMS gave 0.4 mg of nitroglycerin sublingual and aspirin 324 milligram with complete resolution of symptoms.  Per EMS EKG which is not available at this time to review, there was possible ST elevation in inferior leads.  In the ED, afebrile, heart rate in 50s, blood pressure 142/64, breathing on room air Labs showed WC count 3.6, hemoglobin normal, platelet slightly low at 114,, potassium 2.5, calcium low at 6.8, troponin elevated to 41, HDL 30, LDL 108 Chest x-ray with no acute cardiopulmonary disease EKG with sinus rhythm at 57 bpm, nonspecific ST-T wave changes QTc 478 ms  EDP spoke with cardiology on-call Recommended heparin drip initiation Patient was also given oral and IV potassium replacement TRH consulted for in-house medical management  At the time of my evaluation, patient was propped up in bed.  Not in distress.  No ongoing chest pain. Heparin was being initiated. 2 daughters were at bedside. History reviewed and detailed as above. Patient also states that for the last few weeks, she has noticed decreased exercise capacity. Never smoked in life.  But her husband in the same house used to smoke. Reports she used to be on statin in the past but it was stopped because of muscle pain.  Muscle pain resolved after that.  Review of Systems:  All  systems were reviewed and were negative unless otherwise mentioned in the HPI   Past medical history: Past Medical History:  Diagnosis Date   Anxiety state, unspecified    CAD (coronary artery disease)    Disorder of bone and cartilage, unspecified    Diverticulosis of colon (without mention of hemorrhage)    Dizziness and giddiness    Fatty liver    Fibromyalgia    Obesity, unspecified    Other and unspecified hyperlipidemia    Other chronic nonalcoholic liver disease    Skin cancer    basal cell on face   Stroke (HCC)    TIA   TIA (transient ischemic attack)    Unspecified essential hypertension     Past surgical history: Past Surgical History:  Procedure Laterality Date   BACK SURGERY     CHOLECYSTECTOMY     MOHS SURGERY     TONSILLECTOMY AND ADENOIDECTOMY     TOTAL ABDOMINAL HYSTERECTOMY W/ BILATERAL SALPINGOOPHORECTOMY      Social History:  reports that she has never smoked. She has never used smokeless tobacco. She reports that she does not drink alcohol and does not use drugs.  Allergies:  Allergies  Allergen Reactions   Diltiazem Hcl Other (See Comments)    REACTION: pt states it made her dizzy...   Statins     Muscle aches   Zetia [Ezetimibe]     "it made me feel bad"   Codeine Nausea Only   Sulfonamide Derivatives Hives, Itching and Rash   Tape Itching   Tramadol Itching   Diltiazem hcl, Statins, Zetia [ezetimibe],  Codeine, Sulfonamide derivatives, Tape, and Tramadol   Family history:  Family History  Problem Relation Age of Onset   Stomach cancer Mother    Cancer Brother    Breast cancer Sister    Other Father    Breast cancer Sister    Colon cancer Neg Hx      Physical Exam: Vitals:   06/05/23 1040 06/05/23 1200  BP: (!) 142/64 138/68  Pulse: (!) 55 (!) 54  Resp: 11 16  Temp: 98.2 F (36.8 C)   TempSrc: Oral   SpO2: 100% 97%   Wt Readings from Last 3 Encounters:  05/23/23 79.4 kg  04/07/23 79.4 kg  03/22/23 80.6 kg   There  is no height or weight on file to calculate BMI.  General exam: Pleasant, elderly Caucasian female.  Not in physical distress Skin: No rashes, lesions or ulcers. HEENT: Atraumatic, normocephalic, no obvious bleeding Lungs: Clear to auscultation bilaterally CVS: Regular rate and rhythm, no murmur GI/Abd soft, nontender, nondistended, bowel sound present CNS: Alert, awake, oriented x 3 Psychiatry: Mood appropriate Extremities: No pedal edema, no calf redness   ----------------------------------------------------------------------------------------------------------------------------------------- ----------------------------------------------------------------------------------------------------------------------------------------- -----------------------------------------------------------------------------------------------------------------------------------------  Assessment/Plan: Principal Problem:   NSTEMI (non-ST elevated myocardial infarction) (HCC)  NSTEMI Presented with chest pain that resolved with sublingual nitroglycerin and 324 mg of aspirin with EMS EKG without ischemic changes but troponin mildly elevated.  Repeat second troponin. EDP discussed with cardiology on-call.  Recommended initiation of heparin drip. PTA meds-not on any antiplatelet or anticoagulant in the past Recent Labs    06/05/23 1048  TROPONINIHS 41*   Severe hypokalemia Patient reports history of chronic hypokalemia for which she is on potassium supplement at home.  Lasix is one of her home medicines. Potassium low at 2.5.  60 mEq and of oral and IV replacement given.  Add 40 mEq more.  Obtain magnesium level as well. Repeat labs tomorrow Recent Labs  Lab 06/05/23 1048  K 2.5*    Hypertension PTA meds- Lasix 20 mg daily.  Patient denies history of CHF.  She states she had swelling of her legs after which she was started on Lasix and swelling improved.  She has been taking Lasix daily since  then. Euvolemic.  Keep Lasix on hold.  Continue to monitor blood pressure.  IV hydralazine as needed.  H/o TIA HLD Statin intolerance Continue Vascepa 1 g twice daily Patient reports that she used to be on statin in the past but it was stopped because of muscle pain.  Muscle pain resolved after that.  Hypothyroidism Continue Synthroid Obtain TSH Recent Labs    08/03/22 0844 11/01/22 0851  TSH 3.80 4.30   Anxiety/depression PTA meds- Lexapro 20 mg daily, Xanax 0.5 mg 3 times daily as needed Resume both   Mobility: Encourage ambulation  Goals of care:   Code Status: Full Code    DVT prophylaxis:  heparin bolus via infusion 4,000 Units Start: 06/05/23 1245   Antimicrobials: None Fluid: None Consultants: Cardiology Family Communication: Daughters at bedside  Dispo: The patient is from: Home              Anticipated d/c is to: Pending clinical course  Diet: Diet Order             Diet NPO time specified  Diet effective now                    ------------------------------------------------------------------------------------- Severity of Illness: The appropriate patient status for this patient is INPATIENT. Inpatient status is judged  to be reasonable and necessary in order to provide the required intensity of service to ensure the patient's safety. The patient's presenting symptoms, physical exam findings, and initial radiographic and laboratory data in the context of their chronic comorbidities is felt to place them at high risk for further clinical deterioration. Furthermore, it is not anticipated that the patient will be medically stable for discharge from the hospital within 2 midnights of admission.   * I certify that at the point of admission it is my clinical judgment that the patient will require inpatient hospital care spanning beyond 2 midnights from the point of admission due to high intensity of service, high risk for further deterioration and high  frequency of surveillance required.* -------------------------------------------------------------------------------------   Home Meds: Prior to Admission medications   Medication Sig Start Date End Date Taking? Authorizing Provider  acetaminophen (TYLENOL) 500 MG tablet Take 500 mg by mouth every 6 (six) hours as needed.    [provider]  ALPRAZolam Prudy Feeler) 0.5 MG tablet Take 1 tablet (0.5 mg total) by mouth 3 (three) times daily as needed. for anxiety 05/23/23   Etta Grandchild, MD  brimonidine (ALPHAGAN) 0.15 % ophthalmic solution Place 1 drop into both eyes 3 (three) times daily. 01/20/17   [provider]  dicyclomine (BENTYL) 10 MG capsule TAKE 1 CAPSULE BY MOUTH 4 TIMES DAILY BEFORE MEAL(S) AND AT BEDTIME 05/23/23   Etta Grandchild, MD  dorzolamide (TRUSOPT) 2 % ophthalmic solution Place 1 drop into both eyes 3 (three) times daily. 02/06/17   [provider]  escitalopram (LEXAPRO) 20 MG tablet Take 1 tablet (20 mg total) by mouth at bedtime. 05/23/23   Etta Grandchild, MD  fluocinonide cream (LIDEX) 0.05 % Apply 1 Application topically 2 (two) times daily. 05/23/23   Etta Grandchild, MD  furosemide (LASIX) 20 MG tablet TAKE 1 TABLET BY MOUTH IN THE MORNING 05/20/23   Etta Grandchild, MD  levothyroxine (SYNTHROID) 25 MCG tablet Take 1 tablet (25 mcg total) by mouth daily before breakfast. 05/23/23   Etta Grandchild, MD  Lifitegrast Benay Spice) 5 % SOLN Apply 2 drops to eye 2 (two) times daily.    [provider]  nystatin (MYCOSTATIN/NYSTOP) powder Apply 1 Application topically 3 (three) times daily. 11/24/22   Elenore Paddy, NP  potassium chloride SA (KLOR-CON M) 20 MEQ tablet Take 1 tablet by mouth twice daily 01/27/23   Etta Grandchild, MD  VASCEPA 1 g capsule TAKE 2 CAPSULES BY MOUTH IN THE MORNING AND 2 AT BEDTIME 05/23/23   Etta Grandchild, MD    Labs on Admission:   CBC: Recent Labs  Lab 06/05/23 1048  WBC 3.6*  HGB 12.8  HCT 39.3  MCV 89.9  PLT  114*    Basic Metabolic Panel: Recent Labs  Lab 06/05/23 1048  NA 140  K 2.5*  CL 115*  CO2 21*  GLUCOSE 80  BUN 10  CREATININE 0.80  CALCIUM 6.8*    Liver Function Tests: No results for input(s): "AST", "ALT", "ALKPHOS", "BILITOT", "PROT", "ALBUMIN" in the last 168 hours. No results for input(s): "LIPASE", "AMYLASE" in the last 168 hours. No results for input(s): "AMMONIA" in the last 168 hours.  Cardiac Enzymes: No results for input(s): "CKTOTAL", "CKMB", "CKMBINDEX", "TROPONINI" in the last 168 hours.  BNP (last 3 results) No results for input(s): "BNP" in the last 8760 hours.  ProBNP (last 3 results) No results for input(s): "PROBNP" in the last 8760 hours.  CBG: No results for input(s): "GLUCAP" in the last 168 hours.  Lipase     Component Value Date/Time   LIPASE 31.0 10/13/2021 1528     Urinalysis    Component Value Date/Time   COLORURINE YELLOW 01/25/2021 1129   APPEARANCEUR CLEAR 01/25/2021 1129   LABSPEC 1.015 01/25/2021 1129   PHURINE 6.0 01/25/2021 1129   GLUCOSEU NEGATIVE 01/25/2021 1129   HGBUR NEGATIVE 01/25/2021 1129   BILIRUBINUR NEGATIVE 01/25/2021 1129   BILIRUBINUR Negative 10/08/2019 1211   KETONESUR NEGATIVE 01/25/2021 1129   PROTEINUR Negative 10/08/2019 1211   PROTEINUR NEGATIVE 07/26/2011 0112   UROBILINOGEN 0.2 01/25/2021 1129   NITRITE NEGATIVE 01/25/2021 1129   LEUKOCYTESUR NEGATIVE 01/25/2021 1129     Drugs of Abuse  No results found for: "LABOPIA", "COCAINSCRNUR", "LABBENZ", "AMPHETMU", "THCU", "LABBARB"    Radiological Exams on Admission: DG Chest Portable 1 View Result Date: 06/05/2023 CLINICAL DATA:  Chest discomfort this morning. EXAM: PORTABLE CHEST 1 VIEW COMPARISON:  Radiographs 05/24/2022 and 10/13/2021. Chest CT 07/06/2017. FINDINGS: 1151 hours. Lordotic positioning and mild patient rotation to the right. The heart size and mediastinal contours are stable. The lungs appear clear. No pleural effusion or  pneumothorax. No acute osseous findings are seen. Telemetry leads overlie the chest. IMPRESSION: No evidence of acute cardiopulmonary process. Electronically Signed   By: Carey Bullocks M.D.   On: 06/05/2023 12:43     Signed, Lorin Glass, MD [facility] 06/05/2023

## 2023-06-05 NOTE — Progress Notes (Signed)
Discussed hsTroponin 41->145 with Dr. Lynnette Caffey. She remains pain free. Per Dr. Lynnette Caffey, OK to eat, plan left heart cath in AM. I called patient by phone to give her update and discuss consent. Informed Consent   Shared Decision Making/Informed Consent The risks [stroke (1 in 1000), death (1 in 1000), kidney failure [usually temporary] (1 in 500), bleeding (1 in 200), allergic reaction [possibly serious] (1 in 200)], benefits (diagnostic support and management of coronary artery disease) and alternatives of a cardiac catheterization were discussed in detail with [name] and she is willing to proceed.    I added ASA 81mg  daily starting tomorrow (got 324mg  today with EMS). Has h/o statin intolerance. Case tentatively scheduled for afternoon with Dr. Excell Seltzer, time TBD. We will also trend another troponin to assess if this was peak. Will s/o to on call APP if not back by end of shift.

## 2023-06-05 NOTE — ED Triage Notes (Signed)
Pt arrived via ems to the er for the co CP that started around 9am today. Pt was c/o centralized chest pressure that radiated to her jaw. Pt later stated pain had moved to her back between her shoulder blades. Pt was given 0.4mg  nitrog, 324 asp which relieved all of her pain. No cardiac hx per ems. Ems EKG showed st elevated on 3 diff EKG. Hr, bp, 02 WNL

## 2023-06-05 NOTE — Plan of Care (Signed)

## 2023-06-05 NOTE — ED Notes (Signed)
Per lab, Troponin 145, Dahal, MD notified

## 2023-06-05 NOTE — ED Notes (Signed)
..ED TO INPATIENT HANDOFF REPORT  ED Nurse Name and Phone #:  Lanora Manis [phone]  S Name/Age/Gender [name] 82 y.o. female Room/Bed: 042C/042C  Code Status   Code Status: Full Code  Home/SNF/Other Home Patient oriented to: self, place, time, and situation Is this baseline? Yes   Triage Complete: Triage complete  Chief Complaint NSTEMI (non-ST elevated myocardial infarction) [facility]) [I21.4]  Triage Note Pt arrived via ems to the er for the co CP that started around 9am today. Pt was c/o centralized chest pressure that radiated to her jaw. Pt later stated pain had moved to her back between her shoulder blades. Pt was given 0.4mg  nitrog, 324 asp which relieved all of her pain. No cardiac hx per ems. Ems EKG showed st elevated on 3 diff EKG. Hr, bp, 02 WNL   Allergies Allergies  Allergen Reactions   Diltiazem Hcl Other (See Comments)    REACTION: pt states it made her dizzy...   Statins     Muscle aches   Zetia [Ezetimibe]     "it made me feel bad"   Codeine Nausea Only   Sulfonamide Derivatives Hives, Itching and Rash   Tape Itching   Tramadol Itching    Level of Care/Admitting Diagnosis ED Disposition     ED Disposition  Admit   Condition  --   Comment  Hospital Area: [facility] [[id number]]  Level of Care: Progressive [102]  Admit to Progressive based on following criteria: CARDIOVASCULAR & THORACIC of moderate stability with acute coronary syndrome symptoms/low risk myocardial infarction/hypertensive urgency/arrhythmias/heart failure potentially compromising stability and stable post cardiovascular intervention patients.  May admit patient to [facility] or [facility] if equivalent level of care is available:: No  Covid Evaluation: Asymptomatic - no recent exposure (last 10 days) testing not required  Diagnosis: NSTEMI (non-ST elevated myocardial infarction) [facility]) [[id number]]  Admitting Physician: Lorin Glass [[id number]]  Attending Physician:  Lorin Glass [[id number]]  Certification:: I certify this patient will need inpatient services for at least 2 midnights  Expected Medical Readiness: 06/07/2023          B Medical/Surgery History Past Medical History:  Diagnosis Date   Anxiety state, unspecified    CAD (coronary artery disease)    Disorder of bone and cartilage, unspecified    Diverticulosis of colon (without mention of hemorrhage)    Dizziness and giddiness    Fatty liver    Fibromyalgia    Obesity, unspecified    Other and unspecified hyperlipidemia    Other chronic nonalcoholic liver disease    Skin cancer    basal cell on face   Stroke (HCC)    TIA   TIA (transient ischemic attack)    Unspecified essential hypertension    Past Surgical History:  Procedure Laterality Date   BACK SURGERY     CHOLECYSTECTOMY     MOHS SURGERY     TONSILLECTOMY AND ADENOIDECTOMY     TOTAL ABDOMINAL HYSTERECTOMY W/ BILATERAL SALPINGOOPHORECTOMY       A IV Location/Drains/Wounds Patient Lines/Drains/Airways Status     Active Line/Drains/Airways     Name Placement date Placement time Site Days   Peripheral IV 06/05/23 20 G Left Antecubital 06/05/23  --  Antecubital  less than 1   Peripheral IV 06/05/23 20 G Right Antecubital 06/05/23  1321  Antecubital  less than 1            Intake/Output Last 24 hours  Intake/Output Summary (Last 24 hours) at 06/05/2023 2006  Last data filed at 06/05/2023 1348 Gross per 24 hour  Intake 100 ml  Output --  Net 100 ml    Labs/Imaging Results for orders placed or performed during the hospital encounter of 06/05/23 (from the past 48 hours)  Basic metabolic panel     Status: Abnormal   Collection Time: 06/05/23 10:48 AM  Result Value Ref Range   Sodium 140 135 - 145 mmol/L   Potassium 2.5 (LL) 3.5 - 5.1 mmol/L    Comment: CRITICAL RESULT CALLED TO, READ BACK BY AND VERIFIED WITH Leonette Most RN , @1208  , 06/05/23, [place], T.   Chloride 115 (H) 98 - 111 mmol/L   CO2 21 (L) 22  - 32 mmol/L   Glucose, Bld 80 70 - 99 mg/dL    Comment: Glucose reference range applies only to samples taken after fasting for at least 8 hours.   BUN 10 8 - 23 mg/dL   Creatinine, Ser 9.56 0.44 - 1.00 mg/dL   Calcium 6.8 (L) 8.9 - 10.3 mg/dL   GFR, Estimated >21 >30 mL/min    Comment: (NOTE) Calculated using the CKD-EPI Creatinine Equation (2021)    Anion gap 4 (L) 5 - 15    Comment: Performed at [facility], [address] [place], [place] [address]  CBC     Status: Abnormal   Collection Time: 06/05/23 10:48 AM  Result Value Ref Range   WBC 3.6 (L) 4.0 - 10.5 K/uL   RBC 4.37 3.87 - 5.11 MIL/uL   Hemoglobin 12.8 12.0 - 15.0 g/dL   HCT 46.9 62.9 - 52.8 %   MCV 89.9 80.0 - 100.0 fL   MCH 29.3 26.0 - 34.0 pg   MCHC 32.6 30.0 - 36.0 g/dL   RDW 41.3 24.4 - 01.0 %   Platelets 114 (L) 150 - 400 K/uL   nRBC 0.0 0.0 - 0.2 %    Comment: Performed at [facility], [address], [place], [place] [address]  Lipid panel     Status: Abnormal   Collection Time: 06/05/23 10:48 AM  Result Value Ref Range   Cholesterol 156 0 - 200 mg/dL   Triglycerides 92 <664 mg/dL   HDL 30 (L) >40 mg/dL   Total CHOL/HDL Ratio 5.2 RATIO   VLDL 18 0 - 40 mg/dL   LDL Cholesterol 347 (H) 0 - 99 mg/dL    Comment:        Total Cholesterol/HDL:CHD Risk Coronary Heart Disease Risk Table                     Men   Women  1/2 Average Risk   3.4   3.3  Average Risk       5.0   4.4  2 X Average Risk   9.6   7.1  3 X Average Risk  23.4   11.0        Use the calculated Patient Ratio above and the CHD Risk Table to determine the patient's CHD Risk.        ATP III CLASSIFICATION (LDL):  <100     mg/dL   Optimal  425-956  mg/dL   Near or Above                    Optimal  130-159  mg/dL   Borderline  387-564  mg/dL   High  >332     mg/dL   Very High Performed at [facility]  Lab, [address], [place], [place] [address]   Troponin I (High Sensitivity)     Status: Abnormal   Collection  Time: 06/05/23 10:48 AM  Result Value Ref Range   Troponin I (High Sensitivity) 41 (H) <18 ng/L    Comment: (NOTE) Elevated high sensitivity troponin I (hsTnI) values and significant  changes across serial measurements may suggest ACS but many other  chronic and acute conditions are known to elevate hsTnI results.  Refer to the "Links" section for chest pain algorithms and additional  guidance. Performed at [facility], [address], [place], [place] [address]   TSH     Status: None   Collection Time: 06/05/23 10:48 AM  Result Value Ref Range   TSH 2.718 0.350 - 4.500 uIU/mL    Comment: Performed by a 3rd Generation assay with a functional sensitivity of <=0.01 uIU/mL. Performed at [facility], [address] [place], [place] [address]   Magnesium     Status: None   Collection Time: 06/05/23 12:48 PM  Result Value Ref Range   Magnesium 2.1 1.7 - 2.4 mg/dL    Comment: Performed at [facility], [address], [place], [place] [address]  Troponin I (High Sensitivity)     Status: Abnormal   Collection Time: 06/05/23 12:48 PM  Result Value Ref Range   Troponin I (High Sensitivity) 145 (HH) <18 ng/L    Comment: CRITICAL RESULT CALLED TO, READ BACK BY AND VERIFIED WITH MANDY GROSE RN.@1527  ON 12.16.24 BY TCALDWELL MT. (NOTE) Elevated high sensitivity troponin I (hsTnI) values and significant  changes across serial measurements may suggest ACS but many other  chronic and acute conditions are known to elevate hsTnI results.  Refer to the "Links" section for chest pain algorithms and additional  guidance. Performed at [facility], [address], [place], [place] [address]   Troponin I (High Sensitivity)     Status: Abnormal   Collection Time: 06/05/23  5:33 PM  Result Value Ref Range   Troponin I (High Sensitivity) 647 (HH) <18 ng/L    Comment: CRITICAL VALUE NOTED. VALUE IS CONSISTENT WITH PREVIOUSLY REPORTED/CALLED VALUE (NOTE) Elevated high  sensitivity troponin I (hsTnI) values and significant  changes across serial measurements may suggest ACS but many other  chronic and acute conditions are known to elevate hsTnI results.  Refer to the "Links" section for chest pain algorithms and additional  guidance. Performed at [facility], [address] [place], [place] [address]    DG Chest Portable 1 View Result Date: 06/05/2023 CLINICAL DATA:  Chest discomfort this morning. EXAM: PORTABLE CHEST 1 VIEW COMPARISON:  Radiographs 05/24/2022 and 10/13/2021. Chest CT 07/06/2017. FINDINGS: 1151 hours. Lordotic positioning and mild patient rotation to the right. The heart size and mediastinal contours are stable. The lungs appear clear. No pleural effusion or pneumothorax. No acute osseous findings are seen. Telemetry leads overlie the chest. IMPRESSION: No evidence of acute cardiopulmonary process. Electronically Signed   By: Carey Bullocks M.D.   On: 06/05/2023 12:43    Pending Labs Unresulted Labs (From admission, onward)     Start     Ordered   06/06/23 0500  Heparin level (unfractionated)  Daily,   R     Placed in "And" Linked Group   06/05/23 1232   06/06/23 0500  CBC  Daily,   R     Placed in "And" Linked Group   06/05/23 1232   06/06/23  0500  Basic metabolic panel  Tomorrow morning,   R        06/05/23 1303   06/05/23 2100  Heparin level (unfractionated)  Once-Timed,   URGENT        06/05/23 1232            Vitals/Pain Today's Vitals   06/05/23 1703 06/05/23 1926 06/05/23 2002 06/05/23 2006  BP: (!) 156/72   (!) 156/72  Pulse: (!) 55  (!) 56 (!) 53  Resp: 13  12 17   Temp:  97.6 F (36.4 C) 97.9 F (36.6 C)   TempSrc:   Oral   SpO2: 99%  98% 99%  PainSc:        Isolation Precautions No active isolations  Medications Medications  heparin ADULT infusion 100 units/mL (25000 units/292mL) (900 Units/hr Intravenous New Bag/Given 06/05/23 1353)  acetaminophen (TYLENOL) tablet 650 mg (has no  administration in time range)    Or  acetaminophen (TYLENOL) suppository 650 mg (has no administration in time range)  polyethylene glycol (MIRALAX / GLYCOLAX) packet 17 g (has no administration in time range)  bisacodyl (DULCOLAX) EC tablet 5 mg (has no administration in time range)  albuterol (PROVENTIL) (2.5 MG/3ML) 0.083% nebulizer solution 2.5 mg (has no administration in time range)  hydrALAZINE (APRESOLINE) injection 10 mg (has no administration in time range)  ALPRAZolam (XANAX) tablet 0.5 mg (has no administration in time range)  escitalopram (LEXAPRO) tablet 20 mg (has no administration in time range)  levothyroxine (SYNTHROID) tablet 25 mcg (has no administration in time range)  icosapent Ethyl (VASCEPA) 1 g capsule 1 g (1 g Oral Given 06/05/23 1702)  aspirin EC tablet 81 mg (has no administration in time range)  potassium chloride 10 mEq in 100 mL IVPB (0 mEq Intravenous Stopped 06/05/23 1454)  potassium chloride SA (KLOR-CON M) CR tablet 20 mEq (20 mEq Oral Given 06/05/23 1223)  heparin bolus via infusion 4,000 Units (4,000 Units Intravenous Bolus from Bag 06/05/23 1357)  potassium chloride SA (KLOR-CON M) CR tablet 40 mEq (40 mEq Oral Given 06/05/23 1702)    Mobility walks     Focused Assessments Cardiac Assessment Handoff:  Cardiac Rhythm: Normal sinus rhythm Lab Results  Component Value Date   CKTOTAL 49 05/21/2008   CKMB 1.3 05/21/2008   TROPONINI <0.30 09/05/2013   Lab Results  Component Value Date   DDIMER 0.72 (H) 01/13/2022   Does the Patient currently have chest pain? No    R Recommendations: See Admitting Provider Note  Report given to:   Additional Notes: a and o x 4. Denies chest pain at present

## 2023-06-06 ENCOUNTER — Encounter (HOSPITAL_COMMUNITY)
Admission: EM | Disposition: A | Discharge status: Home / Self Care | Payer: Self-pay | Source: Home / Self Care | Attending: Internal Medicine

## 2023-06-06 ENCOUNTER — Inpatient Hospital Stay (HOSPITAL_COMMUNITY): Payer: PPO

## 2023-06-06 DIAGNOSIS — I2510 Atherosclerotic heart disease of native coronary artery without angina pectoris: Secondary | ICD-10-CM

## 2023-06-06 DIAGNOSIS — I214 Non-ST elevation (NSTEMI) myocardial infarction: Secondary | ICD-10-CM | POA: Diagnosis not present

## 2023-06-06 HISTORY — PX: CORONARY STENT INTERVENTION: CATH118234

## 2023-06-06 HISTORY — PX: LEFT HEART CATH AND CORONARY ANGIOGRAPHY: CATH118249

## 2023-06-06 LAB — TROPONIN I (HIGH SENSITIVITY)
Troponin I (High Sensitivity): 1125 ng/L (ref <18)
Troponin I (High Sensitivity): 1281 ng/L (ref <18)
Troponin I (High Sensitivity): 1336 ng/L (ref <18)
Troponin I (High Sensitivity): 947 ng/L (ref <18)

## 2023-06-06 LAB — CBC
HCT: 38.9 % (ref 36.0–46.0)
HCT: 37.1 % (ref 36.0–46.0)
Hemoglobin: 13 g/dL (ref 12.0–15.0)
Hemoglobin: 12.1 g/dL (ref 12.0–15.0)
MCH: 29.6 pg (ref 26.0–34.0)
MCH: 29.4 pg (ref 26.0–34.0)
MCHC: 33.4 g/dL (ref 30.0–36.0)
MCHC: 32.6 g/dL (ref 30.0–36.0)
MCV: 88.6 fL (ref 80.0–100.0)
MCV: 90 fL (ref 80.0–100.0)
Platelets: 107 10*3/uL — ABNORMAL LOW (ref 150–400)
Platelets: 100 10*3/uL — ABNORMAL LOW (ref 150–400)
RBC: 4.39 MIL/uL (ref 3.87–5.11)
RBC: 4.12 MIL/uL (ref 3.87–5.11)
RDW: 13.4 % (ref 11.5–15.5)
RDW: 13.6 % (ref 11.5–15.5)
WBC: 3.9 10*3/uL — ABNORMAL LOW (ref 4.0–10.5)
WBC: 3.7 10*3/uL — ABNORMAL LOW (ref 4.0–10.5)
nRBC: 0 % (ref 0.0–0.2)
nRBC: 0 % (ref 0.0–0.2)

## 2023-06-06 LAB — POCT ACTIVATED CLOTTING TIME: Activated Clotting Time: 492 s

## 2023-06-06 LAB — BASIC METABOLIC PANEL
Anion gap: 7 (ref 5–15)
BUN: 11 mg/dL (ref 8–23)
CO2: 22 mmol/L (ref 22–32)
Calcium: 8.7 mg/dL — ABNORMAL LOW (ref 8.9–10.3)
Chloride: 110 mmol/L (ref 98–111)
Creatinine, Ser: 0.73 mg/dL (ref 0.44–1.00)
GFR, Estimated: >60 mL/min (ref >60)
Glucose, Bld: 94 mg/dL (ref 70–99)
Potassium: 4 mmol/L (ref 3.5–5.1)
Sodium: 139 mmol/L (ref 135–145)

## 2023-06-06 LAB — CREATININE, SERUM
Creatinine, Ser: 0.73 mg/dL (ref 0.44–1.00)
GFR, Estimated: >60 mL/min (ref >60)

## 2023-06-06 LAB — HEPARIN LEVEL (UNFRACTIONATED): Heparin Unfractionated: 0.36 [IU]/mL (ref 0.30–0.70)

## 2023-06-06 SURGERY — LEFT HEART CATH AND CORONARY ANGIOGRAPHY
Anesthesia: LOCAL

## 2023-06-06 MED ORDER — HEPARIN SODIUM (PORCINE) 1000 UNIT/ML IJ SOLN
INTRAMUSCULAR | Status: DC | PRN
  Administered 2023-06-06 (×2): 4000 [IU] via INTRAVENOUS

## 2023-06-06 MED ORDER — ENOXAPARIN SODIUM 40 MG/0.4ML IJ SOSY
40.0000 mg | PREFILLED_SYRINGE | INTRAMUSCULAR | Status: DC
  Filled 2023-06-06: qty 0.4

## 2023-06-06 MED ORDER — LIDOCAINE HCL (PF) 1 % IJ SOLN
INTRAMUSCULAR | Status: AC
  Filled 2023-06-06: qty 30

## 2023-06-06 MED ORDER — SODIUM CHLORIDE 0.9% FLUSH
3.0000 mL | INTRAVENOUS | Status: DC | PRN
Start: 2023-06-06 — End: 2023-06-07

## 2023-06-06 MED ORDER — NITROGLYCERIN 1 MG/10 ML FOR IR/CATH LAB
INTRA_ARTERIAL | Status: AC
  Filled 2023-06-06: qty 10

## 2023-06-06 MED ORDER — CLOPIDOGREL BISULFATE 75 MG PO TABS
75.0000 mg | ORAL_TABLET | Freq: Every day | ORAL | Status: DC
  Administered 2023-06-07: 75 mg via ORAL
  Filled 2023-06-06: qty 1

## 2023-06-06 MED ORDER — IOHEXOL 350 MG/ML SOLN
INTRAVENOUS | Status: DC | PRN
  Administered 2023-06-06: 80 mL

## 2023-06-06 MED ORDER — FAMOTIDINE IN NACL 20-0.9 MG/50ML-% IV SOLN
INTRAVENOUS | Status: AC
  Filled 2023-06-06: qty 50

## 2023-06-06 MED ORDER — LIDOCAINE HCL (PF) 1 % IJ SOLN
INTRAMUSCULAR | Status: DC | PRN
  Administered 2023-06-06: 2 mL

## 2023-06-06 MED ORDER — FAMOTIDINE IN NACL 20-0.9 MG/50ML-% IV SOLN
INTRAVENOUS | Status: AC | PRN
  Administered 2023-06-06: 20 mg via INTRAVENOUS

## 2023-06-06 MED ORDER — ACETAMINOPHEN 325 MG PO TABS
650.0000 mg | ORAL_TABLET | ORAL | Status: DC | PRN
  Administered 2023-06-06 – 2023-06-07 (×2): 650 mg via ORAL
  Filled 2023-06-06 (×2): qty 2

## 2023-06-06 MED ORDER — FENTANYL CITRATE (PF) 100 MCG/2ML IJ SOLN
INTRAMUSCULAR | Status: DC | PRN
  Administered 2023-06-06: 25 ug via INTRAVENOUS

## 2023-06-06 MED ORDER — ONDANSETRON HCL 4 MG/2ML IJ SOLN
4.0000 mg | Freq: Four times a day (QID) | INTRAMUSCULAR | Status: DC | PRN
  Administered 2023-06-07: 4 mg via INTRAVENOUS
  Filled 2023-06-06: qty 2

## 2023-06-06 MED ORDER — SODIUM CHLORIDE 0.9 % IV SOLN
INTRAVENOUS | Status: AC | PRN
  Administered 2023-06-06: 10 mL/h via INTRAVENOUS

## 2023-06-06 MED ORDER — SODIUM CHLORIDE 0.9% FLUSH
3.0000 mL | Freq: Two times a day (BID) | INTRAVENOUS | Status: DC
  Administered 2023-06-06: 3 mL via INTRAVENOUS

## 2023-06-06 MED ORDER — BRIMONIDINE TARTRATE 0.15 % OP SOLN
1.0000 [drp] | Freq: Four times a day (QID) | OPHTHALMIC | Status: DC
  Administered 2023-06-06 – 2023-06-07 (×5): 1 [drp] via OPHTHALMIC
  Filled 2023-06-06: qty 5

## 2023-06-06 MED ORDER — HEPARIN SODIUM (PORCINE) 1000 UNIT/ML IJ SOLN
INTRAMUSCULAR | Status: AC
  Filled 2023-06-06: qty 10

## 2023-06-06 MED ORDER — NITROGLYCERIN 1 MG/10 ML FOR IR/CATH LAB
INTRA_ARTERIAL | Status: DC | PRN
  Administered 2023-06-06: 200 ug via INTRACORONARY

## 2023-06-06 MED ORDER — ALUM & MAG HYDROXIDE-SIMETH 200-200-20 MG/5ML PO SUSP
15.0000 mL | ORAL | Status: DC | PRN
  Administered 2023-06-06: 15 mL via ORAL
  Filled 2023-06-06: qty 30

## 2023-06-06 MED ORDER — FENTANYL CITRATE (PF) 100 MCG/2ML IJ SOLN
INTRAMUSCULAR | Status: AC
  Filled 2023-06-06: qty 2

## 2023-06-06 MED ORDER — ~~LOC~~ CARDIAC SURGERY, PATIENT & FAMILY EDUCATION
Freq: Once | Status: DC
  Filled 2023-06-06: qty 1

## 2023-06-06 MED ORDER — MIDAZOLAM HCL 2 MG/2ML IJ SOLN
INTRAMUSCULAR | Status: DC | PRN
  Administered 2023-06-06: 1 mg via INTRAVENOUS

## 2023-06-06 MED ORDER — VERAPAMIL HCL 2.5 MG/ML IV SOLN
INTRAVENOUS | Status: DC | PRN
  Administered 2023-06-06: 10 mL via INTRA_ARTERIAL

## 2023-06-06 MED ORDER — CLOPIDOGREL BISULFATE 300 MG PO TABS
ORAL_TABLET | ORAL | Status: AC
  Filled 2023-06-06: qty 2

## 2023-06-06 MED ORDER — VERAPAMIL HCL 2.5 MG/ML IV SOLN
INTRAVENOUS | Status: AC
  Filled 2023-06-06: qty 2

## 2023-06-06 MED ORDER — ISOSORBIDE MONONITRATE ER 30 MG PO TB24
30.0000 mg | ORAL_TABLET | Freq: Every day | ORAL | Status: DC
  Administered 2023-06-06: 30 mg via ORAL
  Filled 2023-06-06: qty 1

## 2023-06-06 MED ORDER — MIDAZOLAM HCL 2 MG/2ML IJ SOLN
INTRAMUSCULAR | Status: AC
  Filled 2023-06-06: qty 2

## 2023-06-06 MED ORDER — SODIUM CHLORIDE 0.9 % IV SOLN: 250.0000 mL | INTRAVENOUS | Status: DC | PRN

## 2023-06-06 MED ORDER — HYDRALAZINE HCL 20 MG/ML IJ SOLN: 10.0000 mg | INTRAMUSCULAR | Status: AC | PRN

## 2023-06-06 MED ORDER — CLOPIDOGREL BISULFATE 300 MG PO TABS
ORAL_TABLET | ORAL | Status: DC | PRN
  Administered 2023-06-06: 600 mg via ORAL

## 2023-06-06 MED ORDER — HEPARIN (PORCINE) IN NACL 1000-0.9 UT/500ML-% IV SOLN
INTRAVENOUS | Status: DC | PRN
  Administered 2023-06-06 (×2): 500 mL

## 2023-06-06 SURGICAL SUPPLY — 17 items
BALLN EMERGE MR 2.0X15 (BALLOONS) ×1
BALLOON EMERGE MR 2.0X15 (BALLOONS) IMPLANT
CATH 5FR JL3.5 JR4 ANG PIG MP (CATHETERS) IMPLANT
CATH LAUNCHER 6FR EBU3.5 (CATHETERS) IMPLANT
DEVICE RAD COMP TR BAND LRG (VASCULAR PRODUCTS) IMPLANT
GLIDESHEATH SLEND SS 6F .021 (SHEATH) IMPLANT
GUIDEWIRE INQWIRE 1.5J.035X260 (WIRE) IMPLANT
INQWIRE 1.5J .035X260CM (WIRE) ×1
KIT ENCORE 26 ADVANTAGE (KITS) IMPLANT
KIT SYRINGE INJ CVI SPIKEX1 (MISCELLANEOUS) IMPLANT
PACK CARDIAC CATHETERIZATION (CUSTOM PROCEDURE TRAY) ×2 IMPLANT
SET ATX-X65L (MISCELLANEOUS) IMPLANT
SHEATH PROBE COVER 6X72 (BAG) IMPLANT
STENT SYNERGY XD 2.25X20 (Permanent Stent) IMPLANT
SYNERGY XD 2.25X20 (Permanent Stent) ×1 IMPLANT
TUBING CIL FLEX 10 FLL-RA (TUBING) IMPLANT
WIRE ASAHI PROWATER 180CM (WIRE) IMPLANT

## 2023-06-06 NOTE — Plan of Care (Signed)

## 2023-06-06 NOTE — TOC CM/SW Note (Signed)
Transition of Care [facility]) - Inpatient Brief Assessment   Patient Details  Name: [name] MRN: [id number] Date of Birth: 11/16/1940  Transition of Care [facility]) CM/SW Contact:    Gala Lewandowsky, RN Phone Number: 06/06/2023, 2:02 PM   Clinical Narrative: Patient presented for Nstemi-plan for LHC today. PTA patient states she was independent from home. Daughters at the bedside during the visit. Patient has DME cane and rolling walker in the home; however, does not need to use them. No home needs identified during the visit. Case Manager will continue to follow for additional needs.    Transition of Care Asessment: Insurance and Status: Insurance coverage has been reviewed Patient has primary care physician: Yes Home environment has been reviewed: Reviewed Prior level of function:: Independent Prior/Current Home Services: No current home services Social Drivers of Health Review: SDOH reviewed no interventions necessary Readmission risk has been reviewed: Yes Transition of care needs: no transition of care needs at this time

## 2023-06-06 NOTE — H&P (View-Only) (Signed)
   Patient Name: [name] Date of Encounter: 06/06/2023 [facility] Cardiologist: None   Interval Summary  .    No chest pain this morning. Family at the bedside.   Vital Signs .    Vitals:   06/05/23 2323 06/06/23 0000 06/06/23 0352 06/06/23 0400  BP: (!) 140/64  (!) 131/58   Pulse: 61  62   Resp: 17  14   Temp: 98.3 F (36.8 C)  98.3 F (36.8 C)   TempSrc: Oral  Oral   SpO2:  95% 94% 97%  Weight: 81.2 kg     Height: 5\' 3"  (1.6 m)       Intake/Output Summary (Last 24 hours) at 06/06/2023 0759 Last data filed at 06/06/2023 0529 Gross per 24 hour  Intake 602 ml  Output 1400 ml  Net -798 ml      06/05/2023   11:23 PM 06/05/2023    9:39 PM 05/23/2023    2:14 PM  Last 3 Weights  Weight (lbs) 179 lb 1.6 oz 179 lb 1.6 oz 175 lb  Weight (kg) 81.239 kg 81.239 kg 79.379 kg      Telemetry/ECG    Sinus Rhythm - Personally Reviewed  Physical Exam .   GEN: No acute distress.   Neck: No JVD Cardiac: RRR, no murmurs, rubs, or gallops.  Respiratory: Clear to auscultation bilaterally. GI: Soft, nontender, non-distended  MS: No edema  Assessment & Plan .     Chest pain concerning for unstable angina -- per EMS report, there were concerns about ST elevation in inferior leads? Initial troponin 41, peaked at 1336. EKG showed sinus bradycardia, prolonged QTc, nonspecific TW changes -- planned for cardiac cath today, echo pending -- on IV heparin, ASA, vascepa  Informed Consent   Shared Decision Making/Informed Consent The risks [stroke (1 in 1000), death (1 in 1000), kidney failure [usually temporary] (1 in 500), bleeding (1 in 200), allergic reaction [possibly serious] (1 in 200)], benefits (diagnostic support and management of coronary artery disease) and alternatives of a cardiac catheterization were discussed in detail with Ms. Ferrone and she is willing to proceed.  HLD -- LDL 108, HDL 30 -- statin intolerant, on vascepa. Repatha PTA?  Hypokalemia --  resolved   LE edema -- reports chronic bilateral LE edema. Does not appear volume overloaded on exam -- echo pending   For questions or updates, please contact [facility] Please consult [email or web address] for contact info under   Signed, Laverda Page, NP   I have personally seen and examined this patient. I agree with the assessment and plan as outlined above.  She is admitted with chest pain c/w unstable angina. Troponin is elevated consistent with acute coronary syndrome. Plans in place for cardiac cath later today. She is NPO. She remains in IV heparin. All questions answered.   Verne Carrow, MD, [facility] 06/06/2023 10:09 AM

## 2023-06-06 NOTE — Interval H&P Note (Signed)
History and Physical Interval Note:  06/06/2023 4:58 PM  [name]  has presented today for surgery, with the diagnosis of nstem.  The various methods of treatment have been discussed with the patient and family. After consideration of risks, benefits and other options for treatment, the patient has consented to  Procedure(s): LEFT HEART CATH AND CORONARY ANGIOGRAPHY (N/A) as a surgical intervention.  The patient's history has been reviewed, patient examined, no change in status, stable for surgery.  I have reviewed the patient's chart and labs.  Questions were answered to the patient's satisfaction.    Cath Lab Visit (complete for each Cath Lab visit)  Clinical Evaluation Leading to the Procedure:   ACS: Yes.    Non-ACS:    Anginal Classification: CCS IV  Anti-ischemic medical therapy: No Therapy  Non-Invasive Test Results: No non-invasive testing performed  Prior CABG: No previous CABG       Theron Arista [facility] 06/06/2023 4:58 PM

## 2023-06-06 NOTE — Progress Notes (Signed)
PROGRESS NOTE    [name]  HYQ:[id number] DOB: Aug 21, 1940 DOA: 06/05/2023 PCP: Etta Grandchild, MD    Brief Narrative:  82 year old with history of hypertension, hyperlipidemia, TIA, chronic hypokalemia, hypothyroidism and anxiety admitted with sudden onset of chest discomfort, left arm and jaw pain associated with nausea.  She was given nitroglycerin and aspirin by EMS.  There was questionable ST elevation in the inferior leads that was ruled out by cardiology.  She is admitted to the hospital on heparin infusion.  Subjective: Patient seen in the morning rounds.  She has multiple family members at the bedside.  Denies any complaints.  Waiting to go to cardiac cath. Assessment & Plan:   Non-STEMI: Presented with chest pain and elevated troponins.  EKG without ischemic changes. Currently on heparin infusion, remains on aspirin icosapent.  Scheduled for cardiac cath.  Currently remains stable, further management plan as per cardiology and finding of cardiac cath.  Severe hypokalemia: Presented with potassium of 2.5.  Aggressively replaced.  Adequate.  Magnesium is adequate.  Hypertension: Blood pressure is stable today. Hyperlipidemia: She is statin intolerance.  Patient remains on Vascepa. Hypothyroidism: On Synthroid. Anxiety depression: On Lexapro and Xanax.   DVT prophylaxis: Heparin infusion   Code Status: Full code Family Communication: Multiple family members at the bedside Disposition Plan: Status is: Inpatient Remains inpatient appropriate because: Inpatient procedures planned, on heparin infusion     Consultants:  Cardiology  Procedures:  Cardiac cath planned  Antimicrobials:  None     Objective: Vitals:   06/06/23 0352 06/06/23 0400 06/06/23 0812 06/06/23 1212  BP: (!) 131/58  (!) 136/54 (!) 137/58  Pulse: 62  (!) 59 (!) 57  Resp: 14     Temp: 98.3 F (36.8 C)  97.7 F (36.5 C) 97.7 F (36.5 C)  TempSrc: Oral  Oral Oral  SpO2: 94% 97% 97% 98%   Weight:      Height:        Intake/Output Summary (Last 24 hours) at 06/06/2023 1250 Last data filed at 06/06/2023 0529 Gross per 24 hour  Intake 602 ml  Output 1400 ml  Net -798 ml   Filed Weights   06/05/23 2139 06/05/23 2323  Weight: 81.2 kg 81.2 kg    Examination:  Patient appears comfortable lying in bed. She is on room air..     Data Reviewed: I have personally reviewed following labs and imaging studies  CBC: Recent Labs  Lab 06/05/23 1048 06/06/23 0212  WBC 3.6* 3.9*  HGB 12.8 13.0  HCT 39.3 38.9  MCV 89.9 88.6  PLT 114* 107*   Basic Metabolic Panel: Recent Labs  Lab 06/05/23 1048 06/05/23 1248 06/06/23 0212  NA 140  --  139  K 2.5*  --  4.0  CL 115*  --  110  CO2 21*  --  22  GLUCOSE 80  --  94  BUN 10  --  11  CREATININE 0.80  --  0.73  CALCIUM 6.8*  --  8.7*  MG  --  2.1  --    GFR: Estimated Creatinine Clearance: 54.7 mL/min (by C-G formula based on SCr of 0.73 mg/dL). Liver Function Tests: No results for input(s): "AST", "ALT", "ALKPHOS", "BILITOT", "PROT", "ALBUMIN" in the last 168 hours. No results for input(s): "LIPASE", "AMYLASE" in the last 168 hours. No results for input(s): "AMMONIA" in the last 168 hours. Coagulation Profile: No results for input(s): "INR", "PROTIME" in the last 168 hours. Cardiac Enzymes: No results for input(s): "  CKTOTAL", "CKMB", "CKMBINDEX", "TROPONINI" in the last 168 hours. BNP (last 3 results) No results for input(s): "PROBNP" in the last 8760 hours. HbA1C: No results for input(s): "HGBA1C" in the last 72 hours. CBG: No results for input(s): "GLUCAP" in the last 168 hours. Lipid Profile: Recent Labs    06/05/23 1048  CHOL 156  HDL 30*  LDLCALC 108*  TRIG 92  CHOLHDL 5.2   Thyroid Function Tests: Recent Labs    06/05/23 1048  TSH 2.718   Anemia Panel: No results for input(s): "VITAMINB12", "FOLATE", "FERRITIN", "TIBC", "IRON", "RETICCTPCT" in the last 72 hours. Sepsis Labs: No results  for input(s): "PROCALCITON", "LATICACIDVEN" in the last 168 hours.  No results found for this or any previous visit (from the past 240 hours).       Radiology Studies: DG Chest Portable 1 View Result Date: 06/05/2023 CLINICAL DATA:  Chest discomfort this morning. EXAM: PORTABLE CHEST 1 VIEW COMPARISON:  Radiographs 05/24/2022 and 10/13/2021. Chest CT 07/06/2017. FINDINGS: 1151 hours. Lordotic positioning and mild patient rotation to the right. The heart size and mediastinal contours are stable. The lungs appear clear. No pleural effusion or pneumothorax. No acute osseous findings are seen. Telemetry leads overlie the chest. IMPRESSION: No evidence of acute cardiopulmonary process. Electronically Signed   By: Carey Bullocks M.D.   On: 06/05/2023 12:43        Scheduled Meds:  aspirin EC  81 mg Oral Daily   brimonidine  1 drop Both Eyes QID   Commodore Cardiac Surgery, Patient & Family Education   Does not apply Once   escitalopram  20 mg Oral QHS   icosapent Ethyl  1 g Oral BID   levothyroxine  25 mcg Oral Q0600   Continuous Infusions:  sodium chloride 1 mL/kg/hr (06/06/23 0521)   heparin 900 Units/hr (06/05/23 1353)     LOS: 1 day    Time spent: 35 minutes    Dorcas Carrow, MD [facility]

## 2023-06-06 NOTE — Progress Notes (Addendum)
   Patient Name: [name] Date of Encounter: 06/06/2023 [facility] Cardiologist: None   Interval Summary  .    No chest pain this morning. Family at the bedside.   Vital Signs .    Vitals:   06/05/23 2323 06/06/23 0000 06/06/23 0352 06/06/23 0400  BP: (!) 140/64  (!) 131/58   Pulse: 61  62   Resp: 17  14   Temp: 98.3 F (36.8 C)  98.3 F (36.8 C)   TempSrc: Oral  Oral   SpO2:  95% 94% 97%  Weight: 81.2 kg     Height: 5\' 3"  (1.6 m)       Intake/Output Summary (Last 24 hours) at 06/06/2023 0759 Last data filed at 06/06/2023 0529 Gross per 24 hour  Intake 602 ml  Output 1400 ml  Net -798 ml      06/05/2023   11:23 PM 06/05/2023    9:39 PM 05/23/2023    2:14 PM  Last 3 Weights  Weight (lbs) 179 lb 1.6 oz 179 lb 1.6 oz 175 lb  Weight (kg) 81.239 kg 81.239 kg 79.379 kg      Telemetry/ECG    Sinus Rhythm - Personally Reviewed  Physical Exam .   GEN: No acute distress.   Neck: No JVD Cardiac: RRR, no murmurs, rubs, or gallops.  Respiratory: Clear to auscultation bilaterally. GI: Soft, nontender, non-distended  MS: No edema  Assessment & Plan .     Chest pain concerning for unstable angina -- per EMS report, there were concerns about ST elevation in inferior leads? Initial troponin 41, peaked at 1336. EKG showed sinus bradycardia, prolonged QTc, nonspecific TW changes -- planned for cardiac cath today, echo pending -- on IV heparin, ASA, vascepa  Informed Consent   Shared Decision Making/Informed Consent The risks [stroke (1 in 1000), death (1 in 1000), kidney failure [usually temporary] (1 in 500), bleeding (1 in 200), allergic reaction [possibly serious] (1 in 200)], benefits (diagnostic support and management of coronary artery disease) and alternatives of a cardiac catheterization were discussed in detail with Ms. Ferrone and she is willing to proceed.  HLD -- LDL 108, HDL 30 -- statin intolerant, on vascepa. Repatha PTA?  Hypokalemia --  resolved   LE edema -- reports chronic bilateral LE edema. Does not appear volume overloaded on exam -- echo pending   For questions or updates, please contact [facility] Please consult [email or web address] for contact info under   Signed, Laverda Page, NP   I have personally seen and examined this patient. I agree with the assessment and plan as outlined above.  She is admitted with chest pain c/w unstable angina. Troponin is elevated consistent with acute coronary syndrome. Plans in place for cardiac cath later today. She is NPO. She remains in IV heparin. All questions answered.   Verne Carrow, MD, [facility] 06/06/2023 10:09 AM

## 2023-06-06 NOTE — Progress Notes (Signed)
PHARMACY - ANTICOAGULATION CONSULT NOTE  Pharmacy Consult for heparin Indication: chest pain/ACS  Allergies  Allergen Reactions   Diltiazem Hcl Other (See Comments)    REACTION: pt states it made her dizzy...   Statins     Muscle aches   Zetia [Ezetimibe]     "it made me feel bad"   Codeine Nausea Only   Sulfonamide Derivatives Hives, Itching and Rash   Tape Itching   Tramadol Itching    Patient Measurements: Height: 5\' 3"  (160 cm) Weight: 81.2 kg (179 lb 1.6 oz) IBW/kg (Calculated) : 52.4 Heparin Dosing Weight: 69kg  Vital Signs: Temp: 98.3 F (36.8 C) (12/17 0352) Temp Source: Oral (12/17 0352) BP: 131/58 (12/17 0352) Pulse Rate: 62 (12/17 0352)  Labs: Recent Labs    06/05/23 1048 06/05/23 1248 06/05/23 2150 06/05/23 2358 06/06/23 0212 06/06/23 0354  HGB 12.8  --   --   --  13.0  --   HCT 39.3  --   --   --  38.9  --   PLT 114*  --   --   --  107*  --   HEPARINUNFRC  --   --  0.33  --  0.36  --   CREATININE 0.80  --   --   --  0.73  --   TROPONINIHS 41*   < >  --  1,336* 1,281* 1,125*   < > = values in this interval not displayed.    Estimated Creatinine Clearance: 54.7 mL/min (by C-G formula based on SCr of 0.73 mg/dL).   Medical History: Past Medical History:  Diagnosis Date   Anxiety state, unspecified    CAD (coronary artery disease)    Disorder of bone and cartilage, unspecified    Diverticulosis of colon (without mention of hemorrhage)    Dizziness and giddiness    Fatty liver    Fibromyalgia    Obesity, unspecified    Other and unspecified hyperlipidemia    Other chronic nonalcoholic liver disease    Skin cancer    basal cell on face   Stroke (HCC)    TIA   TIA (transient ischemic attack)    Unspecified essential hypertension     Assessment: 26 YOF presenting with CP and troponin elevation (145 > 1336), she is not on anticoagulation PTA.  Heparin level is therapeutic at 0.36 on 900 units/hr.  CBC stable, no issues noted.  For LHC  today.  Goal of Therapy:  Heparin level 0.3-0.7 units/ml Monitor platelets by anticoagulation protocol: Yes   Plan:  Continue heparin at 900 units/hr Daily heparin level, CBC  F/u after cath  Trixie Rude, PharmD Clinical Pharmacist 06/06/2023  7:01 AM

## 2023-06-07 ENCOUNTER — Other Ambulatory Visit (HOSPITAL_COMMUNITY): Payer: Self-pay

## 2023-06-07 ENCOUNTER — Inpatient Hospital Stay (HOSPITAL_COMMUNITY): Payer: PPO

## 2023-06-07 ENCOUNTER — Other Ambulatory Visit: Payer: Self-pay | Admitting: Internal Medicine

## 2023-06-07 ENCOUNTER — Encounter (HOSPITAL_COMMUNITY): Payer: Self-pay | Admitting: Cardiology

## 2023-06-07 ENCOUNTER — Telehealth: Payer: Self-pay | Admitting: Cardiology

## 2023-06-07 DIAGNOSIS — R079 Chest pain, unspecified: Secondary | ICD-10-CM | POA: Diagnosis not present

## 2023-06-07 DIAGNOSIS — I214 Non-ST elevation (NSTEMI) myocardial infarction: Secondary | ICD-10-CM | POA: Diagnosis not present

## 2023-06-07 DIAGNOSIS — I10 Essential (primary) hypertension: Secondary | ICD-10-CM

## 2023-06-07 LAB — CBC
HCT: 37.4 % (ref 36.0–46.0)
Hemoglobin: 12.2 g/dL (ref 12.0–15.0)
MCH: 29 pg (ref 26.0–34.0)
MCHC: 32.6 g/dL (ref 30.0–36.0)
MCV: 89 fL (ref 80.0–100.0)
Platelets: 101 10*3/uL — ABNORMAL LOW (ref 150–400)
RBC: 4.2 MIL/uL (ref 3.87–5.11)
RDW: 13.6 % (ref 11.5–15.5)
WBC: 3.8 10*3/uL — ABNORMAL LOW (ref 4.0–10.5)
nRBC: 0 % (ref 0.0–0.2)

## 2023-06-07 LAB — BASIC METABOLIC PANEL
Anion gap: 5 (ref 5–15)
BUN: 9 mg/dL (ref 8–23)
CO2: 23 mmol/L (ref 22–32)
Calcium: 8.2 mg/dL — ABNORMAL LOW (ref 8.9–10.3)
Chloride: 110 mmol/L (ref 98–111)
Creatinine, Ser: 0.74 mg/dL (ref 0.44–1.00)
GFR, Estimated: >60 mL/min (ref >60)
Glucose, Bld: 99 mg/dL (ref 70–99)
Potassium: 3.8 mmol/L (ref 3.5–5.1)
Sodium: 138 mmol/L (ref 135–145)

## 2023-06-07 LAB — ECHOCARDIOGRAM COMPLETE
Area-P 1/2: 3.48 cm2
Calc EF: 52.8 %
Height: 63 in
S' Lateral: 2.9 cm
Single Plane A2C EF: 56.2 %
Single Plane A4C EF: 50.3 %
Weight: 2865.59 [oz_av]

## 2023-06-07 MED ORDER — CLOPIDOGREL BISULFATE 75 MG PO TABS
75.0000 mg | ORAL_TABLET | Freq: Every day | ORAL | 2 refills | Status: DC
  Filled 2023-06-07: qty 30, 30d supply, fill #0

## 2023-06-07 MED ORDER — ASPIRIN 81 MG PO TBEC
81.0000 mg | DELAYED_RELEASE_TABLET | Freq: Every day | ORAL | 12 refills | Status: AC
  Filled 2023-06-07: qty 30, 30d supply, fill #0

## 2023-06-07 NOTE — Telephone Encounter (Signed)
Copied from CRM [id number]. Topic: Clinical - Medication Refill >> Jun 07, 2023  1:05 PM Isabell A wrote: Most Recent Primary Care Visit:  Provider: Wyvonne Lenz  Department: [facility]  Visit Type: MEDICARE AWV, SEQUENTIAL  Date: 05/23/2023  Medication: potassium chloride SA (KLOR-CON M) 20 MEQ tablet  Has the patient contacted their pharmacy? No (Agent: If no, request that the patient contact the pharmacy for the refill. If patient does not wish to contact the pharmacy document the reason why and proceed with request.) (Agent: If yes, when and what did the pharmacy advise?)  Is this the correct pharmacy for this prescription? Yes If no, delete pharmacy and type the correct one.  This is the patient's preferred pharmacy:  [facility] [address], [place] - [address] [place] [address] Phone: [phone] Fax: [phone]  [facility] [address] [place] [address] Phone: [phone] Fax: [phone]   Has the prescription been filled recently? Yes  Is the patient out of the medication? Yes  Has the patient been seen for an appointment in the last year OR does the patient have an upcoming appointment? Yes  Can we respond through MyChart? No  Agent: Please be advised that Rx refills may take up to 3 business days. We ask that you follow-up with your pharmacy.

## 2023-06-07 NOTE — Telephone Encounter (Signed)
   Transition of Care Follow-up Phone Call Request    Patient Name: [name] Date of Birth: 10-Mar-1941 Date of Encounter: 06/07/2023  Primary Care Provider:  Etta Grandchild, MD Primary Cardiologist:  None  Kelby Fam has been scheduled for a transition of care follow up appointment with a HeartCare provider:  Eligha Bridegroom 1/13  Please reach out to Kelby Fam within 48 hours of discharge to confirm appointment and review transition of care protocol questionnaire. Anticipated discharge date: 12/18  Laverda Page, NP  06/07/2023, 11:08 AM

## 2023-06-07 NOTE — Progress Notes (Signed)
  Echocardiogram 2D Echocardiogram has been performed.  [name] 06/07/2023, 9:09 AM

## 2023-06-07 NOTE — Discharge Summary (Signed)
Physician Discharge Summary  [name] ZOX:[id number] DOB: 09-12-1940 DOA: 06/05/2023  PCP: Tanya Grandchild, MD  Admit date: 06/05/2023 Discharge date: 06/07/2023  Admitted From: Home Disposition: Home  Recommendations for Outpatient Follow-up:  Follow up with PCP in 1-2 weeks Cardiology to schedule follow-up.  Cardiac rehab to schedule follow-up.  Home Health: N/A Equipment/Devices: N/A  Discharge Condition: Stable CODE STATUS: Full code Diet recommendation: Low-salt diet  Discharge summary: 82 year old with history of hypertension, hyperlipidemia, TIA, chronic hypokalemia on replacement, hypothyroidism presented with sudden onset of chest discomfort, left arm and jaw pain associated with nausea.  She was found to have ST-T wave changes, non-STEMI and admitted to the hospital on heparin infusion.  Underwent cardiac cath and found to have triple-vessel disease,50% narrowing of the mLAD, 95% of OM1 treated with PCI/DES x1. Does have residual disease of the RCA though small vessel to be treated medically. Recommendations for DAPT with ASA/plavix for at least one year.   Patient did not tolerate Imdur, she had a headache with this. Patient will continue Vascepa for antilipid therapy.  She will be seen at lipid clinic for other considerations. Electrolytes are adequate. Clinically stable to discharge with above recommendations from cardiology.    Discharge  Diagnoses:  Principal Problem:   NSTEMI (non-ST elevated myocardial infarction) [facility])    Discharge Instructions  Discharge Instructions     AMB Referral to Advanced Lipid Disorders Clinic   Complete by: As directed    Internal Lipid Clinic Referral Scheduling  Internal lipid clinic referrals are providers within [facility], who wish to refer established patients for routine management (help in starting PCSK9 inhibitor therapy) or advanced therapies.  Internal MD referral criteria:              1. All patients with LDL>190  mg/dL  2. All patients with Triglycerides >500 mg/dL  3. Patients with suspected or confirmed heterozygous familial hyperlipidemia (HeFH) or homozygous familial hyperlipidemia (HoFH)  4. Patients with family history of suspicious for genetic dyslipidemia desiring genetic testing  5. Patients refractory to standard guideline based therapy  6. Patients with statin intolerance (failed 2 statins, one of which must be a high potency statin)  7. Patients who the provider desires to be seen by MD   Internal PharmD referral criteria:   1. Follow-up patients for medication management  2. Follow-up for compliance monitoring  3. Patients for drug education  4. Patients with statin intolerance  5. PCSK9 inhibitor education and prior authorization approvals  6. Patients with triglycerides <500 mg/dL  External Lipid Clinic Referral  External lipid clinic referrals are for providers outside of [facility], considered new clinic patients - automatically routed to MD schedule   Amb Referral to Cardiac Rehabilitation   Complete by: As directed    Diagnosis:  Coronary Stents NSTEMI     After initial evaluation and assessments completed: Virtual Based Care may be provided alone or in conjunction with Phase 2 Cardiac Rehab based on patient barriers.: Yes   Intensive Cardiac Rehabilitation (ICR) [facility] location only OR Traditional Cardiac Rehabilitation (TCR) *If criteria for ICR are not met will enroll in TCR [facility] only): Yes   Diet - low sodium heart healthy   Complete by: As directed    Increase activity slowly   Complete by: As directed       Allergies as of 06/07/2023       Reactions   Diltiazem Hcl Other (See Comments)   REACTION: pt states it made her dizzy.Marland KitchenMarland Kitchen  Statins    Muscle aches   Zetia [ezetimibe]    "it made me feel bad"   Codeine Nausea Only   Sulfonamide Derivatives Hives, Itching, Rash   Tape Itching   Tramadol Itching        Medication List     TAKE these  medications    acetaminophen 500 MG tablet Commonly known as: TYLENOL Take 500 mg by mouth every 6 (six) hours as needed.   ALPRAZolam 0.5 MG tablet Commonly known as: XANAX Take 1 tablet (0.5 mg total) by mouth 3 (three) times daily as needed. for anxiety   aspirin EC 81 MG tablet Take 1 tablet (81 mg total) by mouth daily. Swallow whole. Start taking on: June 08, 2023   brimonidine 0.1 % Soln Commonly known as: ALPHAGAN P Place 1 drop into both eyes 4 (four) times daily.   clopidogrel 75 MG tablet Commonly known as: PLAVIX Take 1 tablet (75 mg total) by mouth daily with breakfast. Start taking on: June 08, 2023   dicyclomine 10 MG capsule Commonly known as: BENTYL TAKE 1 CAPSULE BY MOUTH 4 TIMES DAILY BEFORE MEAL(S) AND AT BEDTIME   escitalopram 20 MG tablet Commonly known as: LEXAPRO Take 1 tablet (20 mg total) by mouth at bedtime.   furosemide 20 MG tablet Commonly known as: LASIX TAKE 1 TABLET BY MOUTH IN THE MORNING   levothyroxine 25 MCG tablet Commonly known as: SYNTHROID Take 1 tablet (25 mcg total) by mouth daily before breakfast.   nystatin powder Commonly known as: MYCOSTATIN/NYSTOP Apply 1 Application topically 3 (three) times daily.   potassium chloride SA 20 MEQ tablet Commonly known as: KLOR-CON M Take 1 tablet by mouth twice daily   Vascepa 1 g capsule Generic drug: icosapent Ethyl TAKE 2 CAPSULES BY MOUTH IN THE MORNING AND 2 AT BEDTIME What changed:  how much to take how to take this when to take this additional instructions   Xiidra 5 % Soln Generic drug: Lifitegrast Apply 2 drops to eye 2 (two) times daily.        Allergies  Allergen Reactions   Diltiazem Hcl Other (See Comments)    REACTION: pt states it made her dizzy...   Statins     Muscle aches   Zetia [Ezetimibe]     "it made me feel bad"   Codeine Nausea Only   Sulfonamide Derivatives Hives, Itching and Rash   Tape Itching   Tramadol Itching     Consultations: Cardiology   Procedures/Studies: ECHOCARDIOGRAM COMPLETE Result Date: 06/07/2023    ECHOCARDIOGRAM REPORT   Patient Name:   [name] Date of Exam: 06/07/2023 Medical Rec #:  [id number]      Height:       63.0 in Accession #:    [id number]     Weight:       179.1 lb Date of Birth:  [birth year]      BSA:          1.845 m Patient Age:    82 years       BP:           128/70 mmHg Patient Gender: F              HR:           63 bpm. Exam Location:  Inpatient Procedure: 2D Echo, Cardiac Doppler and Color Doppler Indications:    R07.9* Chest pain, unspecified  History:        Patient has prior  history of Echocardiogram examinations, most                 recent 03/01/2017. Acute MI and CAD, Signs/Symptoms:Chest Pain;                 Risk Factors:Dyslipidemia. Patient is post PCI.  Sonographer:    Sheralyn Boatman RDCS Referring Phys: [phone] Demetria Pore [place]  Sonographer Comments: Image acquisition challenging due to patient body habitus. IMPRESSIONS  1. Left ventricular ejection fraction, by estimation, is 50 to 55%. The left ventricle has low normal function. The left ventricle demonstrates regional wall motion abnormalities (see scoring diagram/findings for description). The left ventricular internal cavity size was mildly dilated. There is mild concentric left ventricular hypertrophy. Left ventricular diastolic parameters are consistent with Grade II diastolic dysfunction (pseudonormalization).  2. Right ventricular systolic function is normal. The right ventricular size is normal.  3. Left atrial size was mildly dilated.  4. The mitral valve is grossly normal. Mild mitral valve regurgitation. No evidence of mitral stenosis.  5. The aortic valve is tricuspid. There is mild calcification of the aortic valve. Aortic valve regurgitation is mild. Aortic valve sclerosis is present, with no evidence of aortic valve stenosis. FINDINGS  Left Ventricle: Left ventricular ejection fraction, by estimation, is 50  to 55%. The left ventricle has low normal function. The left ventricle demonstrates regional wall motion abnormalities. The left ventricular internal cavity size was mildly dilated. There is mild concentric left ventricular hypertrophy. Left ventricular diastolic parameters are consistent with Grade II diastolic dysfunction (pseudonormalization).  LV Wall Scoring: The mid inferior segment is akinetic. Right Ventricle: The right ventricular size is normal. No increase in right ventricular wall thickness. Right ventricular systolic function is normal. Left Atrium: Left atrial size was mildly dilated. Right Atrium: Right atrial size was normal in size. Pericardium: There is no evidence of pericardial effusion. Mitral Valve: The mitral valve is grossly normal. Mild mitral valve regurgitation. No evidence of mitral valve stenosis. Tricuspid Valve: The tricuspid valve is normal in structure. Tricuspid valve regurgitation is not demonstrated. No evidence of tricuspid stenosis. Aortic Valve: The aortic valve is tricuspid. There is mild calcification of the aortic valve. Aortic valve regurgitation is mild. Aortic valve sclerosis is present, with no evidence of aortic valve stenosis. Pulmonic Valve: The pulmonic valve was normal in structure. Pulmonic valve regurgitation is not visualized. No evidence of pulmonic stenosis. Aorta: The aortic root is normal in size and structure. IAS/Shunts: No atrial level shunt detected by color flow Doppler.  LEFT VENTRICLE PLAX 2D LVIDd:         3.90 cm      Diastology LVIDs:         2.90 cm      LV e' medial:    4.24 cm/s LV PW:         1.20 cm      LV E/e' medial:  21.0 LV IVS:        1.10 cm      LV e' lateral:   8.81 cm/s LVOT diam:     2.00 cm      LV E/e' lateral: 10.1 LV SV:         71 LV SV Index:   38 LVOT Area:     3.14 cm  LV Volumes (MOD) LV vol d, MOD A2C: 88.3 ml LV vol d, MOD A4C: 106.0 ml LV vol s, MOD A2C: 38.7 ml LV vol s, MOD A4C: 52.7 ml LV SV MOD  A2C:     49.6 ml LV SV  MOD A4C:     106.0 ml LV SV MOD BP:      51.4 ml RIGHT VENTRICLE             IVC RV S prime:     10.80 cm/s  IVC diam: 1.90 cm TAPSE (M-mode): 2.2 cm LEFT ATRIUM             Index        RIGHT ATRIUM          Index LA diam:        4.00 cm 2.17 cm/m   RA Area:     9.83 cm LA Vol (A2C):   34.9 ml 18.92 ml/m  RA Volume:   20.50 ml 11.11 ml/m LA Vol (A4C):   38.6 ml 20.92 ml/m LA Biplane Vol: 36.5 ml 19.78 ml/m  AORTIC VALVE LVOT Vmax:   94.30 cm/s LVOT Vmean:  62.400 cm/s LVOT VTI:    0.225 m  AORTA Ao Root diam: 3.30 cm MITRAL VALVE MV Area (PHT): 3.48 cm     SHUNTS MV Decel Time: 218 msec     Systemic VTI:  0.22 m MV E velocity: 89.10 cm/s   Systemic Diam: 2.00 cm MV A velocity: 101.00 cm/s MV E/A ratio:  0.88 Arvilla Meres MD Electronically signed by Arvilla Meres MD Signature Date/Time: 06/07/2023/9:11:10 AM    Final    CARDIAC CATHETERIZATION Result Date: 06/06/2023 3 vessel CAD. There is diffuse 50% narrowing of the proximal to mid LAD. The first diagonal is occluded. There is a high grade stenosis in the first OM. The RCA is a small vessel with 2 branches- one with 99% stenosis an then an 85% stenosis involving the other branch at the bifurcation Normal LV function Normal LVEDP Successful PCI of the first OM with DES Plan: DAPT for one year. Would treat the remainder of her disease medically.   DG Chest Portable 1 View Result Date: 06/05/2023 CLINICAL DATA:  Chest discomfort this morning. EXAM: PORTABLE CHEST 1 VIEW COMPARISON:  Radiographs 05/24/2022 and 10/13/2021. Chest CT 07/06/2017. FINDINGS: 1151 hours. Lordotic positioning and mild patient rotation to the right. The heart size and mediastinal contours are stable. The lungs appear clear. No pleural effusion or pneumothorax. No acute osseous findings are seen. Telemetry leads overlie the chest. IMPRESSION: No evidence of acute cardiopulmonary process. Electronically Signed   By: Carey Bullocks M.D.   On: 06/05/2023 12:43   (Echo,  Carotid, EGD, Colonoscopy, ERCP)    Subjective: Patient seen and examined.  Denies any complaints.  She had headache after taking medication last night that is getting better now.  Eager to go home.  Mild discomfort on the wrist.  No swelling.   Discharge Exam: Vitals:   06/07/23 0440 06/07/23 0451  BP:  128/70  Pulse: 77 62  Resp:  14  Temp: 98.4 F (36.9 C) 98.6 F (37 C)  SpO2:  94%   Vitals:   06/07/23 0000 06/07/23 0435 06/07/23 0440 06/07/23 0451  BP:    128/70  Pulse:   77 62  Resp:  13  14  Temp:   98.4 F (36.9 C) 98.6 F (37 C)  TempSrc:   Oral Oral  SpO2: 99%   94%  Weight:      Height:        General: Pt is alert, awake, not in acute distress Looks comfortable.  Pleasant and interactive. Cardiovascular: RRR, S1/S2 +, no  rubs, no gallops Respiratory: CTA bilaterally, no wheezing, no rhonchi Abdominal: Soft, NT, ND, bowel sounds + Extremities: no edema, no cyanosis    The results of significant diagnostics from this hospitalization (including imaging, microbiology, ancillary and laboratory) are listed below for reference.     Microbiology: No results found for this or any previous visit (from the past 240 hours).   Labs: BNP (last 3 results) No results for input(s): "BNP" in the last 8760 hours. Basic Metabolic Panel: Recent Labs  Lab 06/05/23 1048 06/05/23 1248 06/06/23 0212 06/06/23 1834 06/07/23 0441  NA 140  --  139  --  138  K 2.5*  --  4.0  --  3.8  CL 115*  --  110  --  110  CO2 21*  --  22  --  23  GLUCOSE 80  --  94  --  99  BUN 10  --  11  --  9  CREATININE 0.80  --  0.73 0.73 0.74  CALCIUM 6.8*  --  8.7*  --  8.2*  MG  --  2.1  --   --   --    Liver Function Tests: No results for input(s): "AST", "ALT", "ALKPHOS", "BILITOT", "PROT", "ALBUMIN" in the last 168 hours. No results for input(s): "LIPASE", "AMYLASE" in the last 168 hours. No results for input(s): "AMMONIA" in the last 168 hours. CBC: Recent Labs  Lab  06/05/23 1048 06/06/23 0212 06/06/23 1834 06/07/23 0441  WBC 3.6* 3.9* 3.7* 3.8*  HGB 12.8 13.0 12.1 12.2  HCT 39.3 38.9 37.1 37.4  MCV 89.9 88.6 90.0 89.0  PLT 114* 107* 100* 101*   Cardiac Enzymes: No results for input(s): "CKTOTAL", "CKMB", "CKMBINDEX", "TROPONINI" in the last 168 hours. BNP: Invalid input(s): "POCBNP" CBG: No results for input(s): "GLUCAP" in the last 168 hours. D-Dimer No results for input(s): "DDIMER" in the last 72 hours. Hgb A1c No results for input(s): "HGBA1C" in the last 72 hours. Lipid Profile Recent Labs    06/05/23 1048  CHOL 156  HDL 30*  LDLCALC 108*  TRIG 92  CHOLHDL 5.2   Thyroid function studies Recent Labs    06/05/23 1048  TSH 2.718   Anemia work up No results for input(s): "VITAMINB12", "FOLATE", "FERRITIN", "TIBC", "IRON", "RETICCTPCT" in the last 72 hours. Urinalysis    Component Value Date/Time   COLORURINE YELLOW 01/25/2021 1129   APPEARANCEUR CLEAR 01/25/2021 1129   LABSPEC 1.015 01/25/2021 1129   PHURINE 6.0 01/25/2021 1129   GLUCOSEU NEGATIVE 01/25/2021 1129   HGBUR NEGATIVE 01/25/2021 1129   BILIRUBINUR NEGATIVE 01/25/2021 1129   BILIRUBINUR Negative 10/08/2019 1211   KETONESUR NEGATIVE 01/25/2021 1129   PROTEINUR Negative 10/08/2019 1211   PROTEINUR NEGATIVE 07/26/2011 0112   UROBILINOGEN 0.2 01/25/2021 1129   NITRITE NEGATIVE 01/25/2021 1129   LEUKOCYTESUR NEGATIVE 01/25/2021 1129   Sepsis Labs Recent Labs  Lab 06/05/23 1048 06/06/23 0212 06/06/23 1834 06/07/23 0441  WBC 3.6* 3.9* 3.7* 3.8*   Microbiology No results found for this or any previous visit (from the past 240 hours).   Time coordinating discharge: 35 minutes  SIGNED:   Dorcas Carrow, MD  [facility] 06/07/2023, 10:17 AM

## 2023-06-07 NOTE — Progress Notes (Addendum)
Patient Name: [name] Date of Encounter: 06/07/2023 [facility] Cardiologist: None   Interval Summary  .    No chest pain this morning, mild headache. Had some nausea/vomiting overnight.   Vital Signs .    Vitals:   06/07/23 0000 06/07/23 0435 06/07/23 0440 06/07/23 0451  BP:    128/70  Pulse:   77 62  Resp:  13  14  Temp:   98.4 F (36.9 C) 98.6 F (37 C)  TempSrc:   Oral Oral  SpO2: 99%   94%  Weight:      Height:        Intake/Output Summary (Last 24 hours) at 06/07/2023 0759 Last data filed at 06/07/2023 0530 Gross per 24 hour  Intake 1563.63 ml  Output 1200 ml  Net 363.63 ml      06/05/2023   11:23 PM 06/05/2023    9:39 PM 05/23/2023    2:14 PM  Last 3 Weights  Weight (lbs) 179 lb 1.6 oz 179 lb 1.6 oz 175 lb  Weight (kg) 81.239 kg 81.239 kg 79.379 kg      Telemetry/ECG    Sinus Rhythm - Personally Reviewed  Physical Exam .   GEN: No acute distress.   Neck: No JVD Cardiac: RRR, no murmurs, rubs, or gallops.  Respiratory: Clear to auscultation bilaterally. GI: Soft, nontender, non-distended  MS: No edema Skin: right radial cath site stable with mild bruising   Assessment & Plan .     [name] is a 82 y.o. female with a hx of hyperlipidemia, hypothyroidism, LE edema  who was 06/05/2023 for the evaluation of chest pain at the request of Dr. Renaye Rakers.   Chest pain concerning for unstable angina -- per EMS report, there were concerns about ST elevation in inferior leads? Initial troponin 41, peaked at 1336. EKG showed sinus bradycardia, prolonged QTc, nonspecific TW changes.  -- underwent cardiac cath with 3v CAD, 50% narrowing of the mLAD, 95% of OM1 treated with PCI/DES x1. Does have residual disease of the RCA though small vessel to be treated medically. Recommendations for DAPT with ASA/plavix for at least one year.  -- CR to see -- echo pending -- continue ASA, plavix, vascepa. Developed headache, therefore will stop Imdur.    HLD -- LDL 108, HDL 30 -- statin intolerant, on vascepa. Will refer to lipid clinic as an outpatient   Hypokalemia -- resolved    LE edema -- reports chronic bilateral LE edema. Does not appear volume overloaded on exam -- echo pending   For questions or updates, please contact [facility] Please consult [email or web address] for contact info under   Signed, Laverda Page, NP   Patient seen, examined. Available data reviewed. Agree with findings, assessment, and plan as outlined by Laverda Page, NP.  The patient is independently interviewed and examined.  She is alert, oriented, in no distress.  Lungs are clear bilaterally, heart is regular rate rhythm no murmur gallop, abdomen soft nontender, extremities have no edema, right radial cath site is clear other than mild ecchymosis.  There is no firm hematoma.  Cardiac catheterization films are reviewed and she has small coronary arteries with diffuse calcification.  I agree with medical therapy for severe stenosis throughout a small right coronary artery.  She underwent successful PCI of a tight lesion in the first OM branch yesterday which was likely her culprit lesion.  She will remain on DAPT with aspirin and clopidogrel for 1 year as recommended.  Otherwise if, as outlined above.  The patient appears to be medically stable for discharge today.  [name], M.D. 06/07/2023 10:00 AM

## 2023-06-07 NOTE — Telephone Encounter (Signed)
Left voicemail to return call to office.

## 2023-06-07 NOTE — Progress Notes (Signed)
CARDIAC REHAB PHASE I   PRE:  Rate/Rhythm: 65 SR   BP:  Sitting: 128/70      SaO2: 98 RA   MODE:  Ambulation: 150 ft   POST:  Rate/Rhythm: 76 SR   BP:  Sitting: 120/75      SaO2: 97 RA   Pt ambulated in hallway independently. Tolerated well with no CP, SOB or dizziness. Returned to bed with call bell and bedside table in reach. Post MI/stent education including restrictions, risk factors, exercise guidelines, antiplatelet therapy importance, MI booklet, NTG use, heart healthy diet and CRP2 reviewed. All questions and concerns addressed. Will refer to [facility] for CRP2. Plan for possible discharge later today.  [id number] Woodroe Chen, RN BSN 06/07/2023 9:36 AM

## 2023-06-08 ENCOUNTER — Telehealth: Payer: Self-pay

## 2023-06-08 ENCOUNTER — Other Ambulatory Visit: Payer: Self-pay

## 2023-06-08 DIAGNOSIS — I10 Essential (primary) hypertension: Secondary | ICD-10-CM

## 2023-06-08 LAB — LIPOPROTEIN A (LPA): Lipoprotein (a): 40.8 nmol/L — ABNORMAL HIGH (ref <75.0)

## 2023-06-08 NOTE — Telephone Encounter (Signed)
Copied from CRM [id number]. Topic: Clinical - Medication Refill >> Jun 08, 2023 11:05 AM Joanette Gula wrote: Most Recent Primary Care Visit:  Provider: Wyvonne Lenz  Department: [facility]  Visit Type: MEDICARE AWV, SEQUENTIAL  Date: 05/23/2023  Medication: potassium chloride SA (KLOR-CON M) 20 MEQ tablet  Has the patient contacted their pharmacy? Yes (Agent: If no, request that the patient contact the pharmacy for the refill. If patient does not wish to contact the pharmacy document the reason why and proceed with request.) (Agent: If yes, when and what did the pharmacy advise?)  Is this the correct pharmacy for this prescription? Yes If no, delete pharmacy and type the correct one.  This is the patient's preferred pharmacy:   [facility] [address] [place] - [address] [place] [address] Phone: [phone] Fax: [phone]    Has the prescription been filled recently? Yes  Is the patient out of the medication? Yes  Has the patient been seen for an appointment in the last year OR does the patient have an upcoming appointment? Yes  Can we respond through MyChart? No  Agent: Please be advised that Rx refills may take up to 3 business days. We ask that you follow-up with your pharmacy.

## 2023-06-08 NOTE — Telephone Encounter (Signed)
Copied from CRM [id number]. Topic: Clinical - Medication Refill >> Jun 08, 2023  8:58 AM Almira Coaster wrote: Most Recent Primary Care Visit:  Provider: Wyvonne Lenz  Department: [facility]  Visit Type: MEDICARE AWV, SEQUENTIAL  Date: 05/23/2023  Medication: potassium chloride SA (KLOR-CON M) 20 MEQ tablet  Has the patient contacted their pharmacy? Yes, They need a prescription from the provider's office.  (Agent: If no, request that the patient contact the pharmacy for the refill. If patient does not wish to contact the pharmacy document the reason why and proceed with request.) (Agent: If yes, when and what did the pharmacy advise?)  Is this the correct pharmacy for this prescription? Yes If no, delete pharmacy and type the correct one.  This is the patient's preferred pharmacy:  [facility] [address] [place] - [address] [place] [address] Phone: [phone] Fax: [phone]     Has the prescription been filled recently? No  Is the patient out of the medication? Yes  Has the patient been seen for an appointment in the last year OR does the patient have an upcoming appointment? Yes  Can we respond through MyChart? No, prefers a phone call  Agent: Please be advised that Rx refills may take up to 3 business days. We ask that you follow-up with your pharmacy.

## 2023-06-08 NOTE — Transitions of Care (Post Inpatient/ED Visit) (Signed)
06/08/2023  Name: [name] MRN: [id number] DOB: Apr 04, 1941  Today's TOC FU Call Status: Today's TOC FU Call Status:: Successful TOC FU Call Completed TOC FU Call Complete Date: 06/08/23 Patient's Name and Date of Birth confirmed.  Transition Care Management Follow-up Telephone Call Date of Discharge: 06/07/23 Discharge Facility: [facility]) Type of Discharge: Inpatient Admission Primary Inpatient Discharge Diagnosis:: chest pain How have you been since you were released from the hospital?: Better Any questions or concerns?: Yes Patient Questions/Concerns:: needs K+ filled, she is out uses [employer] Patient Questions/Concerns Addressed: Notified Provider of Patient Questions/Concerns  Items Reviewed: Did you receive and understand the discharge instructions provided?: Yes Medications obtained,verified, and reconciled?: Yes (Medications Reviewed) Any new allergies since your discharge?: No Dietary orders reviewed?: Yes Do you have support at home?: Yes People in Home: other relative(s), child(ren), adult  Medications Reviewed Today: Medications Reviewed Today     Reviewed by Karena Addison, LPN (Licensed Practical Nurse) on 06/08/23 at 1111  Med List Status: <None>   Medication Order Taking? Sig Documenting Provider Last Dose Status Informant  acetaminophen (TYLENOL) 500 MG tablet [id number] No Take 500 mg by mouth every 6 (six) hours as needed.  Patient not taking: Reported on 06/05/2023   [provider] Not Taking Active Self, Pharmacy Records  ALPRAZolam Prudy Feeler) 0.5 MG tablet [id number] No Take 1 tablet (0.5 mg total) by mouth 3 (three) times daily as needed. for anxiety Etta Grandchild, MD 06/04/2023 Active Self, Pharmacy Records  aspirin EC 81 MG tablet [id number]  Take 1 tablet (81 mg total) by mouth daily. Swallow whole. Dorcas Carrow, MD  Active   brimonidine (ALPHAGAN P) 0.1 % SOLN [id number] No Place 1 drop into both eyes 4 (four) times  daily. [provider] 06/05/2023 Active Self, Pharmacy Records  clopidogrel (PLAVIX) 75 MG tablet [id number]  Take 1 tablet (75 mg total) by mouth daily with breakfast. Dorcas Carrow, MD  Active   dicyclomine (BENTYL) 10 MG capsule [id number] No TAKE 1 CAPSULE BY MOUTH 4 TIMES DAILY BEFORE MEAL(S) AND AT BEDTIME Etta Grandchild, MD 06/04/2023 Active Self, Pharmacy Records  escitalopram (LEXAPRO) 20 MG tablet [id number] No Take 1 tablet (20 mg total) by mouth at bedtime. Etta Grandchild, MD 06/04/2023 Active Self, Pharmacy Records  furosemide (LASIX) 20 MG tablet [id number] No TAKE 1 TABLET BY MOUTH IN THE MORNING Etta Grandchild, MD 06/04/2023 Active Self, Pharmacy Records  levothyroxine (SYNTHROID) 25 MCG tablet [id number] No Take 1 tablet (25 mcg total) by mouth daily before breakfast. Etta Grandchild, MD 06/05/2023 Active Self, Pharmacy Records  Lifitegrast [facility]) 5 % SOLN [id number] No Apply 2 drops to eye 2 (two) times daily. [provider] 06/04/2023 Active Self, Pharmacy Records  nystatin (MYCOSTATIN/NYSTOP) powder [id number] No Apply 1 Application topically 3 (three) times daily. Elenore Paddy, NP 06/04/2023 Active Self, Pharmacy Records  potassium chloride SA (KLOR-CON M) 20 MEQ tablet [id number] No Take 1 tablet by mouth twice daily Etta Grandchild, MD 06/04/2023 Active Self, Pharmacy Records  VASCEPA 1 g capsule [id number] No TAKE 2 CAPSULES BY MOUTH IN THE MORNING AND 2 AT BEDTIME  Patient taking differently: Take 1 g by mouth 2 (two) times daily. TAKE 1 CAPSULES BY MOUTH IN THE MORNING AND 1 AT BEDTIME   Etta Grandchild, MD 06/04/2023 Active Self, Pharmacy Records            Home Care and Equipment/Supplies: Were Home Health Services Ordered?: NA Any new  equipment or medical supplies ordered?: NA  Functional Questionnaire: Do you need assistance with bathing/showering or dressing?: No Do you need assistance with meal preparation?: No Do you need assistance with  eating?: No Do you have difficulty maintaining continence: No Do you need assistance with getting out of bed/getting out of a chair/moving?: No Do you have difficulty managing or taking your medications?: No  Follow up appointments reviewed: PCP Follow-up appointment confirmed?: Yes Date of PCP follow-up appointment?: 06/15/23 Follow-up Provider: [facility] Follow-up appointment confirmed?: No Reason Specialist Follow-Up Not Confirmed: Patient has Specialist Provider Number and will Call for Appointment Do you need transportation to your follow-up appointment?: No Do you understand care options if your condition(s) worsen?: Yes-patient verbalized understanding    SIGNATURE Karena Addison, LPN [facility] Nurse Health Advisor Direct Dial [phone]

## 2023-06-09 MED ORDER — POTASSIUM CHLORIDE CRYS ER 20 MEQ PO TBCR: EXTENDED_RELEASE_TABLET | ORAL | 0 refills | Status: DC

## 2023-06-12 NOTE — Telephone Encounter (Signed)
Transition Care Management Unsuccessful Follow-up Telephone Call  Date of discharge and from where:  06/07/23 [facility]  Attempts:  2nd Attempt  Reason for unsuccessful TCM follow-up call:  Unable to leave message

## 2023-06-13 NOTE — Telephone Encounter (Signed)
Patient contacted regarding discharge from [facility] on 06/05/23 Patient understands to follow up with provider Michele} on 07/02/2022 at 02:45 at [facility]. Patient understands discharge instructions? Yes Patient understands medications and regiment? Yes Patient understands to bring all medications to this visit? Yes  Ask patient:  Are you enrolled in My Chart Yes

## 2023-06-15 ENCOUNTER — Inpatient Hospital Stay: Payer: PPO | Admitting: Internal Medicine

## 2023-06-19 ENCOUNTER — Observation Stay (HOSPITAL_COMMUNITY)
Admission: EM | Admit: 2023-06-19 | Discharge: 2023-06-20 | Disposition: A | Discharge status: Home / Self Care | Payer: PPO | Attending: Internal Medicine | Admitting: Internal Medicine

## 2023-06-19 ENCOUNTER — Emergency Department (HOSPITAL_COMMUNITY): Payer: PPO

## 2023-06-19 ENCOUNTER — Other Ambulatory Visit: Payer: Self-pay

## 2023-06-19 ENCOUNTER — Encounter (HOSPITAL_COMMUNITY): Payer: Self-pay | Admitting: Emergency Medicine

## 2023-06-19 DIAGNOSIS — R918 Other nonspecific abnormal finding of lung field: Secondary | ICD-10-CM | POA: Diagnosis not present

## 2023-06-19 DIAGNOSIS — Z7901 Long term (current) use of anticoagulants: Secondary | ICD-10-CM | POA: Insufficient documentation

## 2023-06-19 DIAGNOSIS — E034 Atrophy of thyroid (acquired): Secondary | ICD-10-CM | POA: Insufficient documentation

## 2023-06-19 DIAGNOSIS — I2510 Atherosclerotic heart disease of native coronary artery without angina pectoris: Secondary | ICD-10-CM | POA: Diagnosis not present

## 2023-06-19 DIAGNOSIS — E039 Hypothyroidism, unspecified: Secondary | ICD-10-CM | POA: Diagnosis not present

## 2023-06-19 DIAGNOSIS — F411 Generalized anxiety disorder: Secondary | ICD-10-CM

## 2023-06-19 DIAGNOSIS — E785 Hyperlipidemia, unspecified: Secondary | ICD-10-CM | POA: Diagnosis not present

## 2023-06-19 DIAGNOSIS — I5032 Chronic diastolic (congestive) heart failure: Secondary | ICD-10-CM | POA: Diagnosis not present

## 2023-06-19 DIAGNOSIS — Z79899 Other long term (current) drug therapy: Secondary | ICD-10-CM | POA: Diagnosis not present

## 2023-06-19 DIAGNOSIS — R079 Chest pain, unspecified: Secondary | ICD-10-CM | POA: Diagnosis not present

## 2023-06-19 DIAGNOSIS — R110 Nausea: Secondary | ICD-10-CM | POA: Diagnosis not present

## 2023-06-19 DIAGNOSIS — R609 Edema, unspecified: Secondary | ICD-10-CM | POA: Diagnosis not present

## 2023-06-19 DIAGNOSIS — E782 Mixed hyperlipidemia: Secondary | ICD-10-CM

## 2023-06-19 DIAGNOSIS — R0789 Other chest pain: Principal | ICD-10-CM | POA: Insufficient documentation

## 2023-06-19 DIAGNOSIS — I5030 Unspecified diastolic (congestive) heart failure: Secondary | ICD-10-CM | POA: Insufficient documentation

## 2023-06-19 DIAGNOSIS — Z955 Presence of coronary angioplasty implant and graft: Secondary | ICD-10-CM | POA: Diagnosis not present

## 2023-06-19 LAB — BASIC METABOLIC PANEL
Anion gap: 8 (ref 5–15)
BUN: 14 mg/dL (ref 8–23)
CO2: 23 mmol/L (ref 22–32)
Calcium: 9 mg/dL (ref 8.9–10.3)
Chloride: 109 mmol/L (ref 98–111)
Creatinine, Ser: 0.85 mg/dL (ref 0.44–1.00)
GFR, Estimated: >60 mL/min (ref >60)
Glucose, Bld: 123 mg/dL — ABNORMAL HIGH (ref 70–99)
Potassium: 4.1 mmol/L (ref 3.5–5.1)
Sodium: 140 mmol/L (ref 135–145)

## 2023-06-19 LAB — CBC
HCT: 41.5 % (ref 36.0–46.0)
Hemoglobin: 13.7 g/dL (ref 12.0–15.0)
MCH: 29.5 pg (ref 26.0–34.0)
MCHC: 33 g/dL (ref 30.0–36.0)
MCV: 89.2 fL (ref 80.0–100.0)
Platelets: 140 10*3/uL — ABNORMAL LOW (ref 150–400)
RBC: 4.65 MIL/uL (ref 3.87–5.11)
RDW: 13.5 % (ref 11.5–15.5)
WBC: 4.6 10*3/uL (ref 4.0–10.5)
nRBC: 0 % (ref 0.0–0.2)

## 2023-06-19 LAB — TROPONIN I (HIGH SENSITIVITY)
Troponin I (High Sensitivity): 8 ng/L (ref <18)
Troponin I (High Sensitivity): 10 ng/L (ref <18)

## 2023-06-19 LAB — TSH: TSH: 3.402 u[IU]/mL (ref 0.350–4.500)

## 2023-06-19 LAB — MAGNESIUM: Magnesium: 2.1 mg/dL (ref 1.7–2.4)

## 2023-06-19 MED ORDER — NITROGLYCERIN 0.4 MG SL SUBL: 0.4000 mg | SUBLINGUAL_TABLET | SUBLINGUAL | Status: DC | PRN

## 2023-06-19 MED ORDER — ICOSAPENT ETHYL 1 G PO CAPS
1.0000 g | ORAL_CAPSULE | Freq: Two times a day (BID) | ORAL | Status: DC
  Administered 2023-06-19 – 2023-06-20 (×2): 1 g via ORAL
  Filled 2023-06-19 (×2): qty 1

## 2023-06-19 MED ORDER — ACETAMINOPHEN 650 MG RE SUPP: 650.0000 mg | Freq: Four times a day (QID) | RECTAL | Status: DC | PRN

## 2023-06-19 MED ORDER — CLOPIDOGREL BISULFATE 75 MG PO TABS
75.0000 mg | ORAL_TABLET | Freq: Every day | ORAL | Status: DC
Start: 2023-06-20 — End: 2023-06-20
  Administered 2023-06-20: 75 mg via ORAL
  Filled 2023-06-19: qty 1

## 2023-06-19 MED ORDER — MELATONIN 3 MG PO TABS
3.0000 mg | ORAL_TABLET | Freq: Every evening | ORAL | Status: DC | PRN
Start: 2023-06-19 — End: 2023-06-20

## 2023-06-19 MED ORDER — LEVOTHYROXINE SODIUM 25 MCG PO TABS
25.0000 ug | ORAL_TABLET | Freq: Every day | ORAL | Status: DC
  Administered 2023-06-20: 25 ug via ORAL
  Filled 2023-06-19: qty 1

## 2023-06-19 MED ORDER — PANTOPRAZOLE SODIUM 40 MG IV SOLR
40.0000 mg | INTRAVENOUS | Status: DC
  Administered 2023-06-19: 40 mg via INTRAVENOUS
  Filled 2023-06-19: qty 10

## 2023-06-19 MED ORDER — NALOXONE HCL 0.4 MG/ML IJ SOLN: 0.4000 mg | INTRAMUSCULAR | Status: DC | PRN

## 2023-06-19 MED ORDER — FUROSEMIDE 20 MG PO TABS
20.0000 mg | ORAL_TABLET | Freq: Every morning | ORAL | Status: DC
  Administered 2023-06-20: 20 mg via ORAL
  Filled 2023-06-19: qty 1

## 2023-06-19 MED ORDER — ONDANSETRON HCL 4 MG/2ML IJ SOLN: 4.0000 mg | Freq: Four times a day (QID) | INTRAMUSCULAR | Status: DC | PRN

## 2023-06-19 MED ORDER — ESCITALOPRAM OXALATE 10 MG PO TABS: 20.0000 mg | ORAL_TABLET | Freq: Every day | ORAL | Status: DC

## 2023-06-19 MED ORDER — ASPIRIN 81 MG PO CHEW
81.0000 mg | CHEWABLE_TABLET | Freq: Once | ORAL | Status: AC
  Administered 2023-06-19: 81 mg via ORAL
  Filled 2023-06-19: qty 1

## 2023-06-19 MED ORDER — FAMOTIDINE 20 MG PO TABS
20.0000 mg | ORAL_TABLET | Freq: Once | ORAL | Status: AC
  Administered 2023-06-19: 20 mg via ORAL
  Filled 2023-06-19: qty 1

## 2023-06-19 MED ORDER — ALUM & MAG HYDROXIDE-SIMETH 200-200-20 MG/5ML PO SUSP
30.0000 mL | Freq: Once | ORAL | Status: AC
  Administered 2023-06-19: 30 mL via ORAL
  Filled 2023-06-19: qty 30

## 2023-06-19 MED ORDER — ACETAMINOPHEN 500 MG PO TABS
1000.0000 mg | ORAL_TABLET | Freq: Once | ORAL | Status: AC
  Administered 2023-06-19: 1000 mg via ORAL
  Filled 2023-06-19: qty 2

## 2023-06-19 MED ORDER — LIDOCAINE VISCOUS HCL 2 % MT SOLN
15.0000 mL | Freq: Once | OROMUCOSAL | Status: AC
Start: 2023-06-19 — End: 2023-06-19
  Administered 2023-06-19: 15 mL via ORAL
  Filled 2023-06-19: qty 15

## 2023-06-19 MED ORDER — CLOPIDOGREL BISULFATE 75 MG PO TABS
75.0000 mg | ORAL_TABLET | Freq: Every day | ORAL | Status: DC
  Administered 2023-06-19: 75 mg via ORAL
  Filled 2023-06-19: qty 1

## 2023-06-19 MED ORDER — ALPRAZOLAM 0.25 MG PO TABS
0.5000 mg | ORAL_TABLET | Freq: Three times a day (TID) | ORAL | Status: DC | PRN
  Administered 2023-06-19: 0.5 mg via ORAL
  Filled 2023-06-19: qty 2

## 2023-06-19 MED ORDER — FENTANYL CITRATE PF 50 MCG/ML IJ SOSY: 25.0000 ug | PREFILLED_SYRINGE | INTRAMUSCULAR | Status: DC | PRN

## 2023-06-19 MED ORDER — BRIMONIDINE TARTRATE 0.15 % OP SOLN
1.0000 [drp] | Freq: Four times a day (QID) | OPHTHALMIC | Status: DC
  Filled 2023-06-19: qty 5

## 2023-06-19 MED ORDER — ACETAMINOPHEN 325 MG PO TABS: 650.0000 mg | ORAL_TABLET | Freq: Four times a day (QID) | ORAL | Status: DC | PRN

## 2023-06-19 MED ORDER — POTASSIUM CHLORIDE CRYS ER 20 MEQ PO TBCR
20.0000 meq | EXTENDED_RELEASE_TABLET | Freq: Two times a day (BID) | ORAL | Status: DC
  Administered 2023-06-19 – 2023-06-20 (×2): 20 meq via ORAL
  Filled 2023-06-19 (×2): qty 1

## 2023-06-19 MED ORDER — ISOSORBIDE MONONITRATE ER 30 MG PO TB24
30.0000 mg | ORAL_TABLET | Freq: Every day | ORAL | Status: DC
  Administered 2023-06-19: 30 mg via ORAL
  Filled 2023-06-19: qty 1

## 2023-06-19 MED ORDER — ASPIRIN 81 MG PO TBEC
81.0000 mg | DELAYED_RELEASE_TABLET | Freq: Every day | ORAL | Status: DC
  Administered 2023-06-20: 81 mg via ORAL
  Filled 2023-06-19: qty 1

## 2023-06-19 NOTE — ED Provider Notes (Signed)
Patient here after episode of chest pain.  This is a supervised resident visit.  Just had a stent placed 2 weeks ago.  She has been taking Plavix.  She was doing well except for this episode of chest pain today.  Now resolved.  EKG shows sinus rhythm.  No ischemic changes.  Troponin 8 and 10.  Lab work otherwise unremarkable.  She is well-appearing.  Chest x-ray with no significant findings.  No pneumothorax.  Overall her blood pressure is 150/63.  She is little bit bradycardic.  I have contacted cardiology for recommendations.  They will come down and evaluate the patient.  Not sure if she just needs some more medical optimization.  But labs and EKG are reassuring.  Disposition per cardiology team.  Please see resident note for further results, evaluation, disposition of the patient.  This chart was dictated using voice recognition software.  Despite best efforts to proofread,  errors can occur which can change the documentation meaning.    Virgina Norfolk, DO 06/19/23 5409

## 2023-06-19 NOTE — ED Triage Notes (Signed)
BIB EMS from home with chest pain x 1 hour. Sudden onset while cooking with dizziness, lightheaded and nausea.alert and oriented. Dec 16 had stent placed.   Pain relieved by 3 nitroglycerin given by [facility] EMS EKG NSR 324mg  asa 18g L Hand

## 2023-06-19 NOTE — Consult Note (Signed)
Cardiology Consultation   Patient ID: [name] MRN: [id number]; DOB: [birth year]  Admit date: 06/19/2023 Date of Consult: 06/19/2023  PCP:  Tanya Grandchild, [name]   [facility] Providers Cardiologist:  None        Patient Profile:   [name] is a 82 y.o. female with a hx of coronary artery disease status post PCI to OM1 however did have three-vessel disease, hypertension, hyperlipidemia, history of TIA who is being seen 06/19/2023 for the evaluation of chest pain at the request of [name].  History of Present Illness:   [name] she tells me that she was moving around the house in her usual state, actually making home.  That she was looking forward to eating and then suddenly started to experience chest pressure.  She described this as a midsternal chest pain which 1 started lingered felt like a burning sensation 1 she is felt that burning sensation this radiated to her neck.  She did have some shortness of breath at the time felt nauseated but did not vomit.  She also felt lightheaded however did not pass out.  What was very concerning to the patient is the fact that she was experiencing similar symptoms like she did when she had her recent NSTEMI leading to PCI.  Given latest symptoms and medical history cardiology was asked to see the patient.  During my visit she is not experiencing chest pain she tells me that this felt better after she got the aspirin as well as the nitroglycerin, but then shortly after that similar the pain came but had resolved prior to my visit.  During her ED workup her EKG does not show any acute ischemic findings, troponins negative x 2, chest x-ray is normal.    Her son and daughter were at the bedside  Past Medical History:  Diagnosis Date   Anxiety state, unspecified    CAD (coronary artery disease)    Disorder of bone and cartilage, unspecified    Diverticulosis of colon (without mention of hemorrhage)    Dizziness and  giddiness    Fatty liver    Fibromyalgia    Obesity, unspecified    Other and unspecified hyperlipidemia    Other chronic nonalcoholic liver disease    Skin cancer    basal cell on face   Stroke (HCC)    TIA   TIA (transient ischemic attack)    Unspecified essential hypertension     Past Surgical History:  Procedure Laterality Date   BACK SURGERY     CHOLECYSTECTOMY     CORONARY STENT INTERVENTION N/A 06/06/2023   Procedure: CORONARY STENT INTERVENTION;  Surgeon: [name];  Location: [facility] INVASIVE CV LAB;  Service: Cardiovascular;  Laterality: N/A;   LEFT HEART CATH AND CORONARY ANGIOGRAPHY N/A 06/06/2023   Procedure: LEFT HEART CATH AND CORONARY ANGIOGRAPHY;  Surgeon: [name];  Location: [facility] INVASIVE CV LAB;  Service: Cardiovascular;  Laterality: N/A;   MOHS SURGERY     TONSILLECTOMY AND ADENOIDECTOMY     TOTAL ABDOMINAL HYSTERECTOMY W/ BILATERAL SALPINGOOPHORECTOMY       Home Medications:  Prior to Admission medications   Medication Sig Start Date End Date Taking? Authorizing Provider  acetaminophen (TYLENOL) 500 MG tablet Take 500 mg by mouth every 6 (six) hours as needed. Patient not taking: Reported on 06/05/2023    Provider, Historical, [name]  ALPRAZolam Prudy Feeler) 0.5 MG tablet Take 1 tablet (0.5 mg total) by mouth 3 (three)  times daily as needed. for anxiety 05/23/23   Tanya Grandchild, [name]  aspirin EC 81 MG tablet Take 1 tablet (81 mg total) by mouth daily. Swallow whole. 06/08/23   [name]  brimonidine (ALPHAGAN P) 0.1 % SOLN Place 1 drop into both eyes 4 (four) times daily. 05/17/23   Provider, Historical, [name]  clopidogrel (PLAVIX) 75 MG tablet Take 1 tablet (75 mg total) by mouth daily with breakfast. 06/08/23   [name]  dicyclomine (BENTYL) 10 MG capsule TAKE 1 CAPSULE BY MOUTH 4 TIMES DAILY BEFORE MEAL(S) AND AT BEDTIME 05/23/23   Tanya Grandchild, [name]  escitalopram (LEXAPRO) 20 MG tablet Take 1 tablet (20 mg total) by mouth at bedtime.  05/23/23   Tanya Grandchild, [name]  furosemide (LASIX) 20 MG tablet TAKE 1 TABLET BY MOUTH IN THE MORNING 05/20/23   Tanya Grandchild, [name]  levothyroxine (SYNTHROID) 25 MCG tablet Take 1 tablet (25 mcg total) by mouth daily before breakfast. 05/23/23   Tanya Grandchild, [name]  Lifitegrast Benay Spice) 5 % SOLN Apply 2 drops to eye 2 (two) times daily.    Provider, Historical, [name]  nystatin (MYCOSTATIN/NYSTOP) powder Apply 1 Application topically 3 (three) times daily. 11/24/22   [name]  potassium chloride SA (KLOR-CON M) 20 MEQ tablet Take 1 tablet by mouth twice daily 06/09/23   Tanya Grandchild, [name]  VASCEPA 1 g capsule TAKE 2 CAPSULES BY MOUTH IN THE MORNING AND 2 AT BEDTIME Patient taking differently: Take 1 g by mouth 2 (two) times daily. TAKE 1 CAPSULES BY MOUTH IN THE MORNING AND 1 AT BEDTIME 05/23/23   Tanya Grandchild, [name]    Inpatient Medications: Scheduled Meds:  Continuous Infusions:  PRN Meds:   Allergies:    Allergies  Allergen Reactions   Diltiazem Hcl Other (See Comments)    REACTION: pt states it made her dizzy...   Statins     Muscle aches   Zetia [Ezetimibe]     "it made me feel bad"   Codeine Nausea Only   Sulfonamide Derivatives Hives, Itching and Rash   Tape Itching   Tramadol Itching    Social History:   Social History   Socioeconomic History   Marital status: Widowed    Spouse name: Tanya Gilmore. Morath Sr.   Number of children: 4   Years of education: Not on file   Highest education level: Not on file  Occupational History    Employer: RETIRED  Tobacco Use   Smoking status: Never   Smokeless tobacco: Never  Vaping Use   Vaping status: Never Used  Substance and Sexual Activity   Alcohol use: No   Drug use: No   Sexual activity: Not Currently  Other Topics Concern   Not on file  Social History Narrative   Lives alone   Social Drivers of Health   Financial Resource Strain: Low Risk  (05/23/2023)   Overall Financial Resource Strain (CARDIA)    Difficulty  of Paying Living Expenses: Not hard at all  Food Insecurity: No Food Insecurity (06/05/2023)   Hunger Vital Sign    Worried About Running Out of Food in the Last Year: Never true    Ran Out of Food in the Last Year: Never true  Transportation Needs: No Transportation Needs (06/05/2023)   PRAPARE - Administrator, Civil Service (Medical): No    Lack of Transportation (Non-Medical): No  Physical Activity: Inactive (05/23/2023)   Exercise Vital  Sign    Days of Exercise per Week: 0 days    Minutes of Exercise per Session: 0 min  Stress: Stress Concern Present (05/23/2023)   [employer] of Occupational Health - Occupational Stress Questionnaire    Feeling of Stress : To some extent  Social Connections: Moderately Isolated (05/23/2023)   Social Connection and Isolation Panel [NHANES]    Frequency of Communication with Friends and Family: More than three times a week    Frequency of Social Gatherings with Friends and Family: Once a week    Attends Religious Services: 1 to 4 times per year    Active Member of [employer] or Organizations: No    Attends Banker Meetings: Never    Marital Status: Widowed  Intimate Partner Violence: Not At Risk (06/05/2023)   Humiliation, Afraid, Rape, and Kick questionnaire    Fear of Current or Ex-Partner: No    Emotionally Abused: No    Physically Abused: No    Sexually Abused: No    Family History:    Family History  Problem Relation Age of Onset   Stomach cancer Mother    Cancer Brother    Breast cancer Sister    Other Father    Breast cancer Sister    Colon cancer Neg Hx      ROS:  Please see the history of present illness.  Chest pain, shortness of breath All other ROS reviewed and negative.     Physical Exam/Data:   Vitals:   06/19/23 1423 06/19/23 1425 06/19/23 1431 06/19/23 1730  BP:  105/69 105/69 (!) 150/63  Pulse:  65 65 (!) 52  Resp:  16 14 14   Temp:   98 F (36.7 C)   SpO2:  100% 100% 99%  Weight:  79.4 kg     Height: 5\' 3"  (1.6 m)      No intake or output data in the 24 hours ending 06/19/23 1749    06/19/2023    2:23 PM 06/05/2023   11:23 PM 06/05/2023    9:39 PM  Last 3 Weights  Weight (lbs) 175 lb 179 lb 1.6 oz 179 lb 1.6 oz  Weight (kg) 79.379 kg 81.239 kg 81.239 kg     Body mass index is 31 kg/m.  General:  Well nourished, well developed, in no acute distress HEENT: normal Neck: no JVD Vascular: No carotid bruits; Distal pulses 2+ bilaterally Cardiac:  normal S1, S2; RRR; no murmur  Lungs:  clear to auscultation bilaterally, no wheezing, rhonchi or rales  Abd: soft, nontender, no hepatomegaly  Ext: no edema Musculoskeletal:  No deformities, BUE and BLE strength normal and equal Skin: warm and dry  Neuro:  CNs 2-12 intact, no focal abnormalities noted Psych:  Normal affect   EKG:  The EKG was personally reviewed and demonstrates: Sinus rhythm, heart rate 62 bpm Telemetry:  Telemetry was personally reviewed and demonstrates: Sinus rhythm  Relevant CV Studies: Echo and left heart catheterization  Laboratory Data:  High Sensitivity Troponin:   Recent Labs  Lab 06/06/23 0212 06/06/23 0354 06/06/23 0730 06/19/23 1425 06/19/23 1625  TROPONINIHS 1,281* 1,125* 947* 8 10     Chemistry Recent Labs  Lab 06/19/23 1425  NA 140  K 4.1  CL 109  CO2 23  GLUCOSE 123*  BUN 14  CREATININE 0.85  CALCIUM 9.0  GFRNONAA >60  ANIONGAP 8    No results for input(s): "PROT", "ALBUMIN", "AST", "ALT", "ALKPHOS", "BILITOT" in the last 168 hours. Lipids No results  for input(s): "CHOL", "TRIG", "HDL", "LABVLDL", "LDLCALC", "CHOLHDL" in the last 168 hours.  Hematology Recent Labs  Lab 06/19/23 1425  WBC 4.6  RBC 4.65  HGB 13.7  HCT 41.5  MCV 89.2  MCH 29.5  MCHC 33.0  RDW 13.5  PLT 140*   Thyroid No results for input(s): "TSH", "FREET4" in the last 168 hours.  BNPNo results for input(s): "BNP", "PROBNP" in the last 168 hours.  DDimer No results for input(s):  "DDIMER" in the last 168 hours.   Radiology/Studies:  DG Chest 2 View Result Date: 06/19/2023 CLINICAL DATA:  Chest pain EXAM: CHEST - 2 VIEW COMPARISON:  06/05/2023 chest radiograph. FINDINGS: Stable cardiomediastinal silhouette with normal heart size. No pneumothorax. No pleural effusion. No pulmonary edema. No acute consolidative airspace disease. Thin curvilinear scar or atelectasis in the right mid lung. Left coronary stent noted. Surgical clips are seen in the right upper quadrant of the abdomen. IMPRESSION: Thin curvilinear scar or atelectasis in the right mid lung. Otherwise no active cardiopulmonary disease. Electronically Signed   By: Delbert Phenix M.D.   On: 06/19/2023 16:28     Assessment and Plan:   Coronary artery disease Hyperlipidemia Hypertension   Her chest pain told there are some's characteristics from prior events sound a bit atypical.  With known CAD and some and some very disease will like to start antianginals on this patient to see if this is going to help improve her symptoms/any attempt to optimize her medical therapy. Please continue her dual antiplatelet therapy, she is not on a statin giving significant intolerance and have allergy to Zetia.  She is a candidate for other lipid-lowering agents like PCSK9 inhibitor/bempedoic acid and likely inclisiran-which can be addressed in the outpatient setting.  With the burning sensation as associated with this suspect some GERD may be playing a role.  In the meantime though we will get limited echocardiogram just to assess for ejection fraction any wall motion abnormalities.   Continue to monitor her blood pressure, if need for blood pressure support with recommend starting low-dose beta-blocker.  Risk Assessment/Risk Scores:     For questions or updates, please contact [facility] Please consult [email or web address] for contact info under    SignedThomasene Ripple, DO  06/19/2023 5:49 PM

## 2023-06-19 NOTE — ED Provider Notes (Signed)
[place] EMERGENCY DEPARTMENT AT [facility] Provider Note  HPI   [name] is a 82 y.o. female patient with a PMHx of hypertension, hyperlipidemia, TIA, chronic hypokalemia on replacement, hypothyroidism, recent PCI on 06/06/2023 on DAPT who is here today with concern for chest pain.  Patient had recent stenting as above, came in with chest pain at that time, she is here today with chest pain that she feels is similar.  She was moving around her house, making a cornbread, started having a chest pressure in the middle of her chest, and, radiated up to the neck.  She had associated shortness of breath some nausea but did not vomit, and also some lightheadedness during this time.  She has had no fevers chills no preceding symptoms prior, she has received 4 of aspirin, and also 3 of sublingual nitro which were avid her symptoms  ROS Negative except as per HPI   Medical Decision Making   Upon presentation, the patient is afebrile and hemodynamically stable.  I reviewed the patient's EKG closely, shows normal sinus rhythm without any ST or T wave segment abnormality at this time.  Prior to me seeing her, metabolic panel was back which was largely normal glucose 123 no anion gap suggest DKA.  I reviewed her chest x-ray which looks clear to me but I will await the radiology read,  And now right after I left the room, troponin first 1 is negative at 8, and no leukocytosis platelets are 140 but has no other hematologic abnormality no leukocytosis or fever suggest infection.  For this patient, differential is broad, however there is obvious concern for ACS given her history and presenting symptoms.  She has never had any cancer other than skin cell cancer which was removed, no hemoptysis, no unilateral calf swelling, I do not think she is having a pulmonary embolism at this time, her chest pain sounds much more cardiac in etiology, no pleuritic chest pain.  No recent trauma to suggest rib  fractures, no fevers leukocytosis or coughing up mucus distress pneumonia, do not think aortic dissection does not radiate to arms or legs no abdominal pain or back pain no focal neurodeficits.  We will evaluate this patient, wait for the repeat troponin, and discussed the case with cardiology.  Will obtain any repeat EKG if her symptoms worsen.  I also considered a diagnosis of vasospasm, she does not smoke drink or do any drugs, however could be vasospasm in the setting of recent cath  Negative troponins, labs are largely unremarkable, chest x-ray clear.  Cardiology has seen this patient, recommend admission to the hospitalist, for a TTE to for closer observation.  Patient's pain has mildly persisted although it is turned more into a burning pain, treated with a GI cocktail, still slightly present, been given Tylenol as well.  Give handoff to the hospitalist, we will admit this patient for further observation   Clinical Course as of 06/19/23 1937  Mon Jun 19, 2023  1533 hypertension, hyperlipidemia, TIA, chronic hypokalemia on replacement, hypothyroidism  [JL]    Clinical Course User Index [JL] Gunnar Bulla, MD     1. Chest pain, unspecified type     @DISPOSITION @  Rx / DC Orders ED Discharge Orders     None        Past Medical History:  Diagnosis Date   Anxiety state, unspecified    CAD (coronary artery disease)    Disorder of bone and cartilage, unspecified  Diverticulosis of colon (without mention of hemorrhage)    Dizziness and giddiness    Fatty liver    Fibromyalgia    Obesity, unspecified    Other and unspecified hyperlipidemia    Other chronic nonalcoholic liver disease    Skin cancer    basal cell on face   Stroke (HCC)    TIA   TIA (transient ischemic attack)    Unspecified essential hypertension    Past Surgical History:  Procedure Laterality Date   BACK SURGERY     CHOLECYSTECTOMY     CORONARY STENT INTERVENTION N/A 06/06/2023   Procedure:  CORONARY STENT INTERVENTION;  Surgeon: Swaziland, Peter M, MD;  Location: [facility] INVASIVE CV LAB;  Service: Cardiovascular;  Laterality: N/A;   LEFT HEART CATH AND CORONARY ANGIOGRAPHY N/A 06/06/2023   Procedure: LEFT HEART CATH AND CORONARY ANGIOGRAPHY;  Surgeon: Swaziland, Peter M, MD;  Location: [facility] INVASIVE CV LAB;  Service: Cardiovascular;  Laterality: N/A;   MOHS SURGERY     TONSILLECTOMY AND ADENOIDECTOMY     TOTAL ABDOMINAL HYSTERECTOMY W/ BILATERAL SALPINGOOPHORECTOMY     Family History  Problem Relation Age of Onset   Stomach cancer Mother    Cancer Brother    Breast cancer Sister    Other Father    Breast cancer Sister    Colon cancer Neg Hx    Social History   Socioeconomic History   Marital status: Widowed    Spouse name: Viona Gilmore. Qu Sr.   Number of children: 4   Years of education: Not on file   Highest education level: Not on file  Occupational History    Employer: RETIRED  Tobacco Use   Smoking status: Never   Smokeless tobacco: Never  Vaping Use   Vaping status: Never Used  Substance and Sexual Activity   Alcohol use: No   Drug use: No   Sexual activity: Not Currently  Other Topics Concern   Not on file  Social History Narrative   Lives alone   Social Drivers of Health   Financial Resource Strain: Low Risk  (05/23/2023)   Overall Financial Resource Strain (CARDIA)    Difficulty of Paying Living Expenses: Not hard at all  Food Insecurity: No Food Insecurity (06/05/2023)   Hunger Vital Sign    Worried About Running Out of Food in the Last Year: Never true    Ran Out of Food in the Last Year: Never true  Transportation Needs: No Transportation Needs (06/05/2023)   PRAPARE - Administrator, Civil Service (Medical): No    Lack of Transportation (Non-Medical): No  Physical Activity: Inactive (05/23/2023)   Exercise Vital Sign    Days of Exercise per Week: 0 days    Minutes of Exercise per Session: 0 min  Stress: Stress Concern Present (05/23/2023)    [employer] of Occupational Health - Occupational Stress Questionnaire    Feeling of Stress : To some extent  Social Connections: Moderately Isolated (05/23/2023)   Social Connection and Isolation Panel [NHANES]    Frequency of Communication with Friends and Family: More than three times a week    Frequency of Social Gatherings with Friends and Family: Once a week    Attends Religious Services: 1 to 4 times per year    Active Member of [employer] or Organizations: No    Attends Banker Meetings: Never    Marital Status: Widowed  Intimate Partner Violence: Not At Risk (06/05/2023)   Humiliation, Afraid, Rape, and Kick  questionnaire    Fear of Current or Ex-Partner: No    Emotionally Abused: No    Physically Abused: No    Sexually Abused: No     Physical Exam   Vitals:   06/19/23 1425 06/19/23 1431 06/19/23 1730 06/19/23 1824  BP: 105/69 105/69 (!) 150/63   Pulse: 65 65 (!) 52   Resp: 16 14 14    Temp:  98 F (36.7 C)  97.9 F (36.6 C)  TempSrc:    Oral  SpO2: 100% 100% 99%   Weight:      Height:        Physical Exam Vitals and nursing note reviewed.  Constitutional:      General: She is not in acute distress.    Appearance: She is well-developed.  HENT:     Head: Normocephalic and atraumatic.  Eyes:     Conjunctiva/sclera: Conjunctivae normal.  Cardiovascular:     Rate and Rhythm: Normal rate and regular rhythm.     Heart sounds: No murmur heard.    Comments: No chest wall tenderness Pulmonary:     Effort: Pulmonary effort is normal. No respiratory distress.     Breath sounds: Normal breath sounds.  Abdominal:     General: There is no distension.     Palpations: Abdomen is soft.     Tenderness: There is no abdominal tenderness. There is no right CVA tenderness or left CVA tenderness.  Musculoskeletal:        General: No swelling.     Cervical back: Neck supple.  Skin:    General: Skin is warm and dry.     Capillary Refill: Capillary refill takes  less than 2 seconds.  Neurological:     General: No focal deficit present.     Mental Status: She is alert and oriented to person, place, and time. Mental status is at baseline.  Psychiatric:        Mood and Affect: Mood normal.      Procedures   If procedures were preformed on this patient, they are listed below:  Procedures  The patient was seen, evaluated, and treated in conjunction with the attending physician, who voiced agreement in the care provided.  Note generated using Dragon voice dictation software and may contain dictation errors. Please contact me for any clarification or with any questions.   Electronically signed by:  Osvaldo Shipper, M.D. (PGY-2)    Gunnar Bulla, MD 06/19/23 Aretha Parrot    Virgina Norfolk, DO 06/19/23 2009

## 2023-06-19 NOTE — ED Notes (Signed)
[name] [phone] please call with any patient updates/ movements

## 2023-06-19 NOTE — H&P (Addendum)
History and Physical      AHLEXIS DEBRUHL BJY:[id number] DOB: 10-Aug-1940 DOA: 06/19/2023; DOS: 06/19/2023  PCP: Etta Grandchild, MD  Patient coming from: home   I have personally briefly reviewed patient's old medical records in [facility]  Chief Complaint: Chest pain  HPI: DAYLAN NAEF is a 82 y.o. female with medical history significant for coronary artery disease status post PCI with stent placement to OM 2 on 06/06/2023, hyperlipidemia, chronic diastolic heart failure, GAD, acquired hypothyroidism, who is admitted to [facility] on 06/19/2023 with chest pain after presenting from home to [facility] ED complaining of chest pain.   The patient has a history of coronary artery disease and was recently hospitalized from 06/05/2023 to 06/07/2023 for NSTEMI, during which time she underwent left-sided heart cath with associated PCI and stent placement to the OM 2 on 06/06/2023.  She was subsequent discharged home on 06/07/2023 on dual antibiotic therapy with aspirin and Plavix, with patient reporting good ensuing compliance with both of these antiplatelet medications, noting no missed doses following discharged home.   She conveys that she has been completely asymptomatic in the interval since discharge from the hospital until earlier today, when she experienced an episode of nonradiating substernal chest pressure that started while she was briskly walking around the house gathering cooking materials to bake cookies.  She notes that this chest pain was associated with shortness of breath, nausea in the absence of vomiting diaphoresis, dizziness, lightheadedness, but did not result in formal loss of consciousness or fall.  Not associate any palpitations.  She reports that the intensity of this chest discomfort improved when she stopped ambulating, and significantly improved following sublingual nitroglycerin x 3 doses, now noting very mild residual chest discomfort, which she states has changed  in quality.  Specifically, the original substernal chest pressure now has a burning element to it.  She conveys that while the chest pain was exertional, she conveys that it is nonpleuritic, nonpositional, not reproducible after palpation of the anterior chest wall.  Denies any recent hemoptysis, new lower extremity erythema, or new lower extremity calf tenderness.  No trauma following discharged home on 06/07/2023.  Denies any recent subjective fever, chills, rigors, generalized manage is no recent cough, wheezing.  In the setting of mild residual chest discomfort after the aforementioned 3 sublingual nitroglycerin at home, she contacted EMS, who administered full dose aspirin x 1, and brought the patient to [facility] emergency department for further evaluation management thereof.  Most recent echocardiogram was complete in nature, and performed on 06/07/2023, which showed LVEF 50 to 55%, grade 2 diastolic dysfunction, normal right ventricular systolic function, as well as mild mitral regurgitation.  It is noted that she is not on a statin due to associated intolerance with the statin class.  Rather she is on Vascepa.    ED Course:  Vital signs in the ED were notable for the following: Afebrile; heart rates in the 50s to 60s; systolic blood pressures in the low 100s to 150s; respiratory rate 14-16, oxygen saturation 99 to 100% on room air.  Labs were notable for the following: BMP notable for the following: Sodium 140, potassium 4.1, carbon 23, BUN 14 compared to 9 on 06/07/2023, creatinine 0.85, glucose 123.  High-sensitivity troponin I initially 8, 3 penetrating episode 10.  This is relative to most recent prior high sensitive troponin I data point of 947 on 06/06/2023, as well as peak troponin level during most recent hospitalization, which is noted  to be 1336 on 06/05/2023.  CBC notable for Locasol count 4600, hemoglobin 13.7, compared to 12.2 on 06/07/2023, platelet count 140 compared to 101 on  06/07/2023.  Per my interpretation, EKG in ED demonstrated the following: In comparison to most recent prior EKG from 06/07/2023, presenting EKG shows sinus rhythm with heart rate 62, normal intervals, nonspecific T wave inversion in aVF and V3, which appears slightly more pronounced now relative to the flat appearing T waves noted on EKG from 06/07/2023, while showing no evidence of ST changes, Cleen evidence of ST ovation.  Imaging in the ED, per corresponding formal radiology read, was notable for the following: 2 view chest x-ray shows atelectasis in the right midlung, but otherwise no evidence of acute cardiopulmonary process, Cleen evidence of infiltrate, edema, effusion, or pneumothorax.  EDP discussed patient's case with on-call [facility] cardiology, Dr. Lorrin Jackson, who recommends overnight observation to [facility] for further chest pain evaluation, including limited echocardiogram. Cardiology to formally consult and does not recommend initiation of heparin drip at this time.   While in the ED, the following were administered: GI cocktail with Maalox and Mylanta as well as viscous lidocaine x 1, aspirin 81 mg p.o. x 1 dose, Plavix 75 mg p.o. x 1 dose, Pepcid 20 mg p.o. x 1 dose, Imdur 30 mg p.o. x 1 dose, acetaminophen 1 g p.o. x 1 dose.  Subsequently, the patient was admitted for further evaluation management of presenting chest pain.    Review of Systems: As per HPI otherwise 10 point review of systems negative.   Past Medical History:  Diagnosis Date   Anxiety state, unspecified    CAD (coronary artery disease)    Disorder of bone and cartilage, unspecified    Diverticulosis of colon (without mention of hemorrhage)    Dizziness and giddiness    Fatty liver    Fibromyalgia    Obesity, unspecified    Other and unspecified hyperlipidemia    Other chronic nonalcoholic liver disease    Skin cancer    basal cell on face   Stroke (HCC)    TIA   TIA (transient ischemic attack)    Unspecified  essential hypertension     Past Surgical History:  Procedure Laterality Date   BACK SURGERY     CHOLECYSTECTOMY     CORONARY STENT INTERVENTION N/A 06/06/2023   Procedure: CORONARY STENT INTERVENTION;  Surgeon: Swaziland, Peter M, MD;  Location: [facility] INVASIVE CV LAB;  Service: Cardiovascular;  Laterality: N/A;   LEFT HEART CATH AND CORONARY ANGIOGRAPHY N/A 06/06/2023   Procedure: LEFT HEART CATH AND CORONARY ANGIOGRAPHY;  Surgeon: Swaziland, Peter M, MD;  Location: [facility] INVASIVE CV LAB;  Service: Cardiovascular;  Laterality: N/A;   MOHS SURGERY     TONSILLECTOMY AND ADENOIDECTOMY     TOTAL ABDOMINAL HYSTERECTOMY W/ BILATERAL SALPINGOOPHORECTOMY      Social History:  reports that she has never smoked. She has never used smokeless tobacco. She reports that she does not drink alcohol and does not use drugs.   Allergies  Allergen Reactions   Diltiazem Hcl Other (See Comments)    REACTION: pt states it made her dizzy...   Statins     Muscle aches   Zetia [Ezetimibe]     "it made me feel bad"   Codeine Nausea Only   Sulfonamide Derivatives Hives, Itching and Rash   Tape Itching   Tramadol Itching    Family History  Problem Relation Age of Onset   Stomach cancer Mother  Cancer Brother    Breast cancer Sister    Other Father    Breast cancer Sister    Colon cancer Neg Hx     Family history reviewed and not pertinent    Prior to Admission medications   Medication Sig Start Date End Date Taking? Authorizing Provider  acetaminophen (TYLENOL) 500 MG tablet Take 500 mg by mouth every 6 (six) hours as needed. Patient not taking: Reported on 06/05/2023    [provider]  ALPRAZolam Prudy Feeler) 0.5 MG tablet Take 1 tablet (0.5 mg total) by mouth 3 (three) times daily as needed. for anxiety 05/23/23   Etta Grandchild, MD  aspirin EC 81 MG tablet Take 1 tablet (81 mg total) by mouth daily. Swallow whole. 06/08/23   Dorcas Carrow, MD  brimonidine (ALPHAGAN P) 0.1 % SOLN Place 1 drop  into both eyes 4 (four) times daily. 05/17/23   [provider]  clopidogrel (PLAVIX) 75 MG tablet Take 1 tablet (75 mg total) by mouth daily with breakfast. 06/08/23   Dorcas Carrow, MD  dicyclomine (BENTYL) 10 MG capsule TAKE 1 CAPSULE BY MOUTH 4 TIMES DAILY BEFORE MEAL(S) AND AT BEDTIME 05/23/23   Etta Grandchild, MD  escitalopram (LEXAPRO) 20 MG tablet Take 1 tablet (20 mg total) by mouth at bedtime. 05/23/23   Etta Grandchild, MD  furosemide (LASIX) 20 MG tablet TAKE 1 TABLET BY MOUTH IN THE MORNING 05/20/23   Etta Grandchild, MD  levothyroxine (SYNTHROID) 25 MCG tablet Take 1 tablet (25 mcg total) by mouth daily before breakfast. 05/23/23   Etta Grandchild, MD  Lifitegrast Benay Spice) 5 % SOLN Apply 2 drops to eye 2 (two) times daily.    [provider]  nystatin (MYCOSTATIN/NYSTOP) powder Apply 1 Application topically 3 (three) times daily. 11/24/22   Elenore Paddy, NP  potassium chloride SA (KLOR-CON M) 20 MEQ tablet Take 1 tablet by mouth twice daily 06/09/23   Etta Grandchild, MD  VASCEPA 1 g capsule TAKE 2 CAPSULES BY MOUTH IN THE MORNING AND 2 AT BEDTIME Patient taking differently: Take 1 g by mouth 2 (two) times daily. TAKE 1 CAPSULES BY MOUTH IN THE MORNING AND 1 AT BEDTIME 05/23/23   Etta Grandchild, MD     Objective    Physical Exam: Vitals:   06/19/23 1425 06/19/23 1431 06/19/23 1730 06/19/23 1824  BP: 105/69 105/69 (!) 150/63   Pulse: 65 65 (!) 52   Resp: 16 14 14    Temp:  98 F (36.7 C)  97.9 F (36.6 C)  TempSrc:    Oral  SpO2: 100% 100% 99%   Weight:      Height:        General: appears to be stated age; alert, oriented Skin: warm, dry, no rash Head:  AT/Parkin Mouth:  Oral mucosa membranes appear moist, normal dentition Neck: supple; trachea midline Heart:  RRR; did not appreciate any M/R/G Lungs: CTAB, did not appreciate any wheezes, rales, or rhonchi Abdomen: + BS; soft, ND, NT Vascular: 2+ pedal pulses b/l; 2+ radial pulses b/l Extremities: no  peripheral edema, no muscle wasting Neuro: strength and sensation intact in upper and lower extremities b/l    Labs on Admission: I have personally reviewed following labs and imaging studies  CBC: Recent Labs  Lab 06/19/23 1425  WBC 4.6  HGB 13.7  HCT 41.5  MCV 89.2  PLT 140*   Basic Metabolic Panel: Recent Labs  Lab 06/19/23 1425  NA 140  K 4.1  CL 109  CO2 23  GLUCOSE 123*  BUN 14  CREATININE 0.85  CALCIUM 9.0   GFR: Estimated Creatinine Clearance: 50.9 mL/min (by C-G formula based on SCr of 0.85 mg/dL). Liver Function Tests: No results for input(s): "AST", "ALT", "ALKPHOS", "BILITOT", "PROT", "ALBUMIN" in the last 168 hours. No results for input(s): "LIPASE", "AMYLASE" in the last 168 hours. No results for input(s): "AMMONIA" in the last 168 hours. Coagulation Profile: No results for input(s): "INR", "PROTIME" in the last 168 hours. Cardiac Enzymes: No results for input(s): "CKTOTAL", "CKMB", "CKMBINDEX", "TROPONINI" in the last 168 hours. BNP (last 3 results) No results for input(s): "PROBNP" in the last 8760 hours. HbA1C: No results for input(s): "HGBA1C" in the last 72 hours. CBG: No results for input(s): "GLUCAP" in the last 168 hours. Lipid Profile: No results for input(s): "CHOL", "HDL", "LDLCALC", "TRIG", "CHOLHDL", "LDLDIRECT" in the last 72 hours. Thyroid Function Tests: No results for input(s): "TSH", "T4TOTAL", "FREET4", "T3FREE", "THYROIDAB" in the last 72 hours. Anemia Panel: No results for input(s): "VITAMINB12", "FOLATE", "FERRITIN", "TIBC", "IRON", "RETICCTPCT" in the last 72 hours. Urine analysis:    Component Value Date/Time   COLORURINE YELLOW 01/25/2021 1129   APPEARANCEUR CLEAR 01/25/2021 1129   LABSPEC 1.015 01/25/2021 1129   PHURINE 6.0 01/25/2021 1129   GLUCOSEU NEGATIVE 01/25/2021 1129   HGBUR NEGATIVE 01/25/2021 1129   BILIRUBINUR NEGATIVE 01/25/2021 1129   BILIRUBINUR Negative 10/08/2019 1211   KETONESUR NEGATIVE 01/25/2021  1129   PROTEINUR Negative 10/08/2019 1211   PROTEINUR NEGATIVE 07/26/2011 0112   UROBILINOGEN 0.2 01/25/2021 1129   NITRITE NEGATIVE 01/25/2021 1129   LEUKOCYTESUR NEGATIVE 01/25/2021 1129    Radiological Exams on Admission: DG Chest 2 View Result Date: 06/19/2023 CLINICAL DATA:  Chest pain EXAM: CHEST - 2 VIEW COMPARISON:  06/05/2023 chest radiograph. FINDINGS: Stable cardiomediastinal silhouette with normal heart size. No pneumothorax. No pleural effusion. No pulmonary edema. No acute consolidative airspace disease. Thin curvilinear scar or atelectasis in the right mid lung. Left coronary stent noted. Surgical clips are seen in the right upper quadrant of the abdomen. IMPRESSION: Thin curvilinear scar or atelectasis in the right mid lung. Otherwise no active cardiopulmonary disease. Electronically Signed   By: Delbert Phenix M.D.   On: 06/19/2023 16:28      Assessment/Plan   Principal Problem:   Chest pain Active Problems:   HLD (hyperlipidemia)   GAD (generalized anxiety disorder)   Acquired hypothyroidism   Chronic diastolic CHF (congestive heart failure) (HCC)    #) Chest pain: This patient with known history of coronary artery disease, status post recent PCI with stent placement to OM 2 on 06/06/2023, presents with 1 episode of substernal nonradiating pressure that has been exertional, improved with rest, and significantly improved with sublingual nitroglycerin x 3 doses, with mild residual chest pain noted at this time, and associated with shortness of breath, nausea, palpitations, and dizziness.  The description of this chest pain appears to have some consistencies with typical sounding chest pain.  Presenting troponin x 2 are nonelevated and flat, and significantly lower in the high sensitive troponin I values during her most recent prior hospitalization for NSTEMI, with peak troponin during that hospitalization noted to be 1,336 on 06/05/2023 EKG shows nonspecific T wave changes  in aVF and V3, but no evidence of ST changes, including no evidence of ST elevation.  Additionally, chest x-ray shows no evidence of acute cardiopulmonary process, including no evidence of pneumothorax.    EDP discussed patient's  case with on-call [facility] cardiology, Dr. Lorrin Jackson, who recommends overnight observation to [facility] for further chest pain evaluation, including limited echocardiogram.  She conveys concern for potential GI source, including GERD, given transition and quality of the discomfort to a burning sensation.  Cardiology to formally consult and does not recommend initiation of heparin drip at this time.   At this time, ACS appears less likely, particularly in light of recent heart catheter results, and outstanding interval compliance with dual antiplatelet therapy in the setting of her recent PCI with stent placement to the OM 2.  However, we will continue to trend troponin for further ACS rule out.   She has now received a GI cocktail with viscous lidocaine, dose of oral Pepcid.  Will add IV Protonix as well to further optimize the approach towards treating potential contribution from GERD.  Will also further evaluate GI sources by checking liver enzymes as well as lipase level.  It is noted that she has a history of prior cholecystectomy.  Noncardiac differential also includes potential contribution from generalized anxiety disorder given a documented history of such.  Will resume her home Lexapro and prn Xanax.  Clinically, presentation appears less suggestive of acute pulmonary embolism.  Noted to be hemodynamically stable at this time.  In terms of plaque stabilization medications, she is not currently on a statin due to history of intolerance to this class.  Rather, she is on Vascepa as an outpatient.  She has received a full dose aspirin via EMS earlier today.   Plan: Monitor on telemetry.  Add on serum magnesium level.  Check CMP, lipase.  Repeat CT PCP morning.  Cardiology to formally  consult, as above.  Limited echo ordered.  Trend troponin.  Protonix 40 mg IV daily, with first dose now.  Prn sublingual nitroglycerin.  As needed IV fentanyl for pain not relieved by nitroglycerin.  Continue outpatient dual antiplatelet therapy.  Continue outpatient Vascepa.  Continue outpatient Lexapro and prn Xanax.                  #) Hyperlipidemia: documented h/o such. On vascepa as outpatient, as the patient has a documented history of intolerance to the statin class.   Plan: continue home vascepa .                     #) Generalized anxiety disorder: documented h/o such. On Lexapro as well as as needed Xanax as outpatient.  Notable history in the context of her presenting chest discomfort.  Plan: Resume outpatient Lexapro as well as as needed Xanax.                  #) acquired hypothyroidism: documented h/o such, on Synthroid as outpatient.   Plan: cont home Synthroid.  Add on TSH.                      #) Chronic diastolic heart failure: documented history of such, with most recent echocardiogram performed on 06/07/2023, which was notable for LVEF 50 to 55%, grade 2 diastolic dysfunction, normal right ventricular systolic function, and mild mitral regurgitation.. No clinical or radiographic evidence to suggest acutely decompensated heart failure at this time. home diuretic regimen reportedly consists of the following: Lasix 20 mg p.o. every morning.  Additionally, she is on potassium chloride 20 mill equivalents p.o. twice daily at home.  Presenting potassium level 4.1.  Plan: monitor strict I's & O's and daily weights. Repeat CMP in AM.  Check serum mag level. Continue home diuretic regimen.  Continue outpatient potassium supplementation.  Add on BNP.  Follow for result of updated limited echocardiogram, as recommended by cardiology for this evening's consultation.       DVT prophylaxis: SCD's   Code Status: Full  code Family Communication: I discussed patient's case with her son, who is present at bedside. Disposition Plan: Per Rounding Team Consults called: EDP d/w on-call [facility] cardiology, Dr. Lorrin Jackson, who will formally consult, as further detailed above;  Admission status: Observation     I SPENT GREATER THAN 75  MINUTES IN CLINICAL CARE TIME/MEDICAL DECISION-MAKING IN COMPLETING THIS ADMISSION.      Chaney Born Hartlyn Reigel DO [facility]  From 7PM - 7AM   06/19/2023, 7:43 PM

## 2023-06-20 ENCOUNTER — Observation Stay (HOSPITAL_BASED_OUTPATIENT_CLINIC_OR_DEPARTMENT_OTHER): Payer: PPO

## 2023-06-20 ENCOUNTER — Other Ambulatory Visit (HOSPITAL_COMMUNITY): Payer: Self-pay

## 2023-06-20 DIAGNOSIS — I10 Essential (primary) hypertension: Secondary | ICD-10-CM | POA: Diagnosis not present

## 2023-06-20 DIAGNOSIS — I25118 Atherosclerotic heart disease of native coronary artery with other forms of angina pectoris: Secondary | ICD-10-CM | POA: Diagnosis not present

## 2023-06-20 DIAGNOSIS — I2510 Atherosclerotic heart disease of native coronary artery without angina pectoris: Secondary | ICD-10-CM | POA: Diagnosis not present

## 2023-06-20 LAB — TROPONIN I (HIGH SENSITIVITY)
Troponin I (High Sensitivity): 11 ng/L (ref <18)
Troponin I (High Sensitivity): 9 ng/L (ref <18)

## 2023-06-20 LAB — ECHOCARDIOGRAM LIMITED
Calc EF: 52.6 %
Height: 63 in
P 1/2 time: 836 ms
S' Lateral: 3.3 cm
Single Plane A2C EF: 51.9 %
Single Plane A4C EF: 57.4 %
Weight: 2800 [oz_av]

## 2023-06-20 LAB — CBC WITH DIFFERENTIAL/PLATELET
Abs Immature Granulocytes: 0.04 10*3/uL (ref 0.00–0.07)
Basophils Absolute: 0 10*3/uL (ref 0.0–0.1)
Basophils Relative: 1 %
Eosinophils Absolute: 0.1 10*3/uL (ref 0.0–0.5)
Eosinophils Relative: 2 %
HCT: 38.6 % (ref 36.0–46.0)
Hemoglobin: 12.6 g/dL (ref 12.0–15.0)
Immature Granulocytes: 1 %
Lymphocytes Relative: 20 %
Lymphs Abs: 0.9 10*3/uL (ref 0.7–4.0)
MCH: 29.6 pg (ref 26.0–34.0)
MCHC: 32.6 g/dL (ref 30.0–36.0)
MCV: 90.8 fL (ref 80.0–100.0)
Monocytes Absolute: 0.4 10*3/uL (ref 0.1–1.0)
Monocytes Relative: 8 %
Neutro Abs: 3.2 10*3/uL (ref 1.7–7.7)
Neutrophils Relative %: 68 %
Platelets: 123 10*3/uL — ABNORMAL LOW (ref 150–400)
RBC: 4.25 MIL/uL (ref 3.87–5.11)
RDW: 13.7 % (ref 11.5–15.5)
WBC: 4.6 10*3/uL (ref 4.0–10.5)
nRBC: 0 % (ref 0.0–0.2)

## 2023-06-20 LAB — COMPREHENSIVE METABOLIC PANEL
ALT: 20 U/L (ref 0–44)
AST: 27 U/L (ref 15–41)
Albumin: 3.2 g/dL — ABNORMAL LOW (ref 3.5–5.0)
Alkaline Phosphatase: 85 U/L (ref 38–126)
Anion gap: 6 (ref 5–15)
BUN: 15 mg/dL (ref 8–23)
CO2: 25 mmol/L (ref 22–32)
Calcium: 8.5 mg/dL — ABNORMAL LOW (ref 8.9–10.3)
Chloride: 107 mmol/L (ref 98–111)
Creatinine, Ser: 1.12 mg/dL — ABNORMAL HIGH (ref 0.44–1.00)
GFR, Estimated: 49 mL/min — ABNORMAL LOW (ref >60)
Glucose, Bld: 127 mg/dL — ABNORMAL HIGH (ref 70–99)
Potassium: 3.9 mmol/L (ref 3.5–5.1)
Sodium: 138 mmol/L (ref 135–145)
Total Bilirubin: 0.5 mg/dL (ref 0.0–1.2)
Total Protein: 5.6 g/dL — ABNORMAL LOW (ref 6.5–8.1)

## 2023-06-20 LAB — LIPASE, BLOOD: Lipase: 28 U/L (ref 11–51)

## 2023-06-20 LAB — MAGNESIUM: Magnesium: 2.2 mg/dL (ref 1.7–2.4)

## 2023-06-20 LAB — BRAIN NATRIURETIC PEPTIDE: B Natriuretic Peptide: 235.5 pg/mL — ABNORMAL HIGH (ref 0.0–100.0)

## 2023-06-20 MED ORDER — PANTOPRAZOLE SODIUM 40 MG PO TBEC
40.0000 mg | DELAYED_RELEASE_TABLET | Freq: Every day | ORAL | 0 refills | Status: DC
  Filled 2023-06-20: qty 90, 90d supply, fill #0

## 2023-06-20 MED ORDER — ISOSORBIDE MONONITRATE ER 30 MG PO TB24
30.0000 mg | ORAL_TABLET | Freq: Every day | ORAL | 0 refills | Status: DC
  Filled 2023-06-20: qty 90, 90d supply, fill #0

## 2023-06-20 NOTE — Progress Notes (Signed)
  Echocardiogram 2D Echocardiogram has been performed.  Ocie Doyne RDCS 06/20/2023, 8:50 AM

## 2023-06-20 NOTE — Discharge Summary (Signed)
 Physician Discharge Summary  [name] FMW:[id number] DOB: May 31, 1941 DOA: 06/19/2023  PCP: Joshua Debby CROME, MD  Admit date: 06/19/2023 Discharge date: 06/20/2023  Admitted From: Home  Disposition:  Home  Recommendations for Outpatient Follow-up:  Follow up with PCP in 1-2 weeks Follow-up with cardiology as scheduled on 07/03/2023 Started on Imdur  30mg  PO daily by cardiology for anginal pain Started on protonix  40mg  PO daily for GERD symptoms  Home Health: no Equipment/Devices: none  Discharge Condition: Stable CODE STATUS: Full Code Diet recommendation: Heart Healthy diet  History of present illness:  [name] is an 82 year old female with past medical history significant for CAD s/p PCI/stent to OM 2 on 06/06/2023, HLD, chronic diastolic congestive heart failure, generalized anxiety disorder, acquired hypothyroidism who presented to [facility] ED on 12/30 with complaint of chest pain.  Since most recent hospitalization she has been asymptomatic since discharge but experienced an episode of nonradiating substernal chest pressure while she was grossly walking around her house.  Reports chest pain was associate with shortness of breath, nausea.  Denies vomiting, no diaphoresis, no dizziness/lightheadedness.  Also reports no palpitations.  Patient took 3 doses of sublingual nitroglycerin  with improvement of symptoms. Denies any recent hemoptysis, new lower extremity erythema, or new lower extremity calf tenderness. No trauma following discharged home on 06/07/2023. Denies any recent subjective fever, chills, rigors, generalized malaise, no cough/wheezing.   In the ED, temperature 98.0 F, HR 65, RR 16, BP 105/69, SpO2 1 high percent on room air.  WBC 4.6, hemoglobin 13.7, platelet count 140.  Sodium 140, potassium 4.1, chloride 109, CO2 23, glucose 123, BUN 14, creatinine 0.85.  Magnesium 2.1.  High-sensitivity troponin 8, 10, 11.  TSH 2.402.  Chest x-ray with atelectasis right midlung  otherwise no active cardiopulmonary disease process.  The patient was given GI cocktail with Maalox and Mylanta as well as viscous lidocaine  x 1, aspirin  81 mg p.o. x 1 dose, Plavix  75 mg p.o. x 1 dose, Pepcid  20 mg p.o. x 1 dose, Imdur  30 mg p.o. x 1 dose, acetaminophen  1 g p.o. x 1 dose per EDP.  Cardiology was consulted, Dr. Sheena recommended obtaining limited echocardiogram, no recommendations to initiate heparin  drip at this time and medicine admission.  TRH consulted for admission.  Hospital course:  Coronary artery disease s/p recent PCI/stent OM 2 with angina Chronic diastolic congestive heart failure, compensated Patient presenting to ED with recurrence of chest pain.  Patient is afebrile without leukocytosis.  High sensitive troponin negative x 3.  TSH within normal limits.  Chest x-ray with no acute findings.  Patient was seen by cardiology with recommendation of limited echocardiogram which was reported by cardiology, Dr. Sheena to have no acute findings; but official read pending at time of discharge.  Cardiology recommended initiation of Imdur  30 mg p.o. daily.  No further testing needed and patient has outpatient appointment scheduled with cardiology on 07/03/2023.  Continue aspirin  a 1 mg p.o. daily, Plavix  105 mg p.o. daily, furosemide  20 mg p.o. daily.  Hyperlipidemia LDL 108, HDL 30.  Intolerant to statins.  Continue Vascepa .  Pending referral to lipid clinic outpatient.  Hypothyroidism TSH 2.402, within normal limits.  Continue levothyroxine  25 mcg p.o. daily  Generalized anxiety disorder Lexapro  20 mg p.o. daily, Xanax  0.5 mg p.o. 3 times daily as needed anxiety.  Concern for GERD Started on Protonix  40 g p.o. daily.  Outpatient follow-up PCP.  Obesity, class III Body mass index is 31 kg/m.  Complicates all facets of  care  Discharge Diagnoses:  Principal Problem:   Chest pain Active Problems:   HLD (hyperlipidemia)   GAD (generalized anxiety disorder)   Acquired  hypothyroidism   Chronic diastolic CHF (congestive heart failure) [facility])    Discharge Instructions  Discharge Instructions     Call MD for:  difficulty breathing, headache or visual disturbances   Complete by: As directed    Call MD for:  extreme fatigue   Complete by: As directed    Call MD for:  persistant dizziness or light-headedness   Complete by: As directed    Call MD for:  persistant nausea and vomiting   Complete by: As directed    Call MD for:  severe uncontrolled pain   Complete by: As directed    Call MD for:  temperature >100.4   Complete by: As directed    Diet - low sodium heart healthy   Complete by: As directed    Increase activity slowly   Complete by: As directed       Allergies as of 06/20/2023       Reactions   Diltiazem Hcl Other (See Comments)   REACTION: pt states it made her dizzy...   Statins    Muscle aches   Zetia  [ezetimibe ]    it made me feel bad   Codeine Nausea Only   Sulfonamide Derivatives Hives, Itching, Rash   Tape Itching   Tramadol  Itching        Medication List     TAKE these medications    acetaminophen  500 MG tablet Commonly known as: TYLENOL  Take 500 mg by mouth every 6 (six) hours as needed for moderate pain (pain score 4-6).   ALPRAZolam  0.5 MG tablet Commonly known as: XANAX  Take 1 tablet (0.5 mg total) by mouth 3 (three) times daily as needed. for anxiety   aspirin  EC 81 MG tablet Take 1 tablet (81 mg total) by mouth daily. Swallow whole.   brimonidine  0.1 % Soln Commonly known as: ALPHAGAN  P Place 1 drop into both eyes 4 (four) times daily.   clopidogrel  75 MG tablet Commonly known as: PLAVIX  Take 1 tablet (75 mg total) by mouth daily with breakfast.   dicyclomine  10 MG capsule Commonly known as: BENTYL  TAKE 1 CAPSULE BY MOUTH 4 TIMES DAILY BEFORE MEAL(S) AND AT BEDTIME   escitalopram  20 MG tablet Commonly known as: LEXAPRO  Take 1 tablet (20 mg total) by mouth at bedtime.   furosemide  20 MG  tablet Commonly known as: LASIX  TAKE 1 TABLET BY MOUTH IN THE MORNING   isosorbide  mononitrate 30 MG 24 hr tablet Commonly known as: IMDUR  Take 1 tablet (30 mg total) by mouth daily. Start taking on: June 21, 2023   levothyroxine  25 MCG tablet Commonly known as: SYNTHROID  Take 1 tablet (25 mcg total) by mouth daily before breakfast.   nystatin  powder Commonly known as: MYCOSTATIN /NYSTOP  Apply 1 Application topically 3 (three) times daily. What changed:  when to take this reasons to take this   pantoprazole  40 MG tablet Commonly known as: Protonix  Take 1 tablet (40 mg total) by mouth daily.   potassium chloride  SA 20 MEQ tablet Commonly known as: KLOR-CON  M Take 1 tablet by mouth twice daily   Vascepa  1 g capsule Generic drug: icosapent  Ethyl TAKE 2 CAPSULES BY MOUTH IN THE MORNING AND 2 AT BEDTIME What changed:  how much to take how to take this when to take this additional instructions        Follow-up Information  Joshua Debby CROME, MD. Schedule an appointment as soon as possible for a visit in 1 week(s).   Specialty: Internal Medicine Contact information: [address] [place] [address] [phone]         Tanya Rosaline HERO, NP. Go on 07/03/2023.   Specialty: Cardiology Contact information: [address] [place] [address] [phone]                Allergies  Allergen Reactions   Diltiazem Hcl Other (See Comments)    REACTION: pt states it made her dizzy...   Statins     Muscle aches   Zetia  [Ezetimibe ]     it made me feel bad   Codeine Nausea Only   Sulfonamide Derivatives Hives, Itching and Rash   Tape Itching   Tramadol  Itching    Consultations: Cardiology, Dr. Sheena   Procedures/Studies: DG Chest 2 View Result Date: 06/19/2023 CLINICAL DATA:  Chest pain EXAM: CHEST - 2 VIEW COMPARISON:  06/05/2023 chest radiograph. FINDINGS: Stable cardiomediastinal silhouette with normal heart size. No  pneumothorax. No pleural effusion. No pulmonary edema. No acute consolidative airspace disease. Thin curvilinear scar or atelectasis in the right mid lung. Left coronary stent noted. Surgical clips are seen in the right upper quadrant of the abdomen. IMPRESSION: Thin curvilinear scar or atelectasis in the right mid lung. Otherwise no active cardiopulmonary disease. Electronically Signed   By: [name] Blue M.D.   On: 06/19/2023 16:28   ECHOCARDIOGRAM COMPLETE Result Date: 06/07/2023    ECHOCARDIOGRAM REPORT   Patient Name:   [name] Date of Exam: 06/07/2023 Medical Rec #:  [id number]      Height:       63.0 in Accession #:    [id number]     Weight:       179.1 lb Date of Birth:  1941-01-05      BSA:          1.845 m Patient Age:    82 years       BP:           128/70 mmHg Patient Gender: F              HR:           63 bpm. Exam Location:  Inpatient Procedure: 2D Echo, Cardiac Doppler and Color Doppler Indications:    R07.9* Chest pain, unspecified  History:        Patient has prior history of Echocardiogram examinations, most                 recent 03/01/2017. Acute MI and CAD, Signs/Symptoms:Chest Pain;                 Risk Factors:Dyslipidemia. Patient is post PCI.  Sonographer:    [name] RDCS Referring Phys: [phone] [name]  Sonographer Comments: Image acquisition challenging due to patient body habitus. IMPRESSIONS  1. Left ventricular ejection fraction, by estimation, is 50 to 55%. The left ventricle has low normal function. The left ventricle demonstrates regional wall motion abnormalities (see scoring diagram/findings for description). The left ventricular internal cavity size was mildly dilated. There is mild concentric left ventricular hypertrophy. Left ventricular diastolic parameters are consistent with Grade II diastolic dysfunction (pseudonormalization).  2. Right ventricular systolic function is normal. The right ventricular size is normal.  3. Left atrial size was mildly dilated.  4.  The mitral valve is grossly normal. Mild mitral valve regurgitation. No evidence of mitral  stenosis.  5. The aortic valve is tricuspid. There is mild calcification of the aortic valve. Aortic valve regurgitation is mild. Aortic valve sclerosis is present, with no evidence of aortic valve stenosis. FINDINGS  Left Ventricle: Left ventricular ejection fraction, by estimation, is 50 to 55%. The left ventricle has low normal function. The left ventricle demonstrates regional wall motion abnormalities. The left ventricular internal cavity size was mildly dilated. There is mild concentric left ventricular hypertrophy. Left ventricular diastolic parameters are consistent with Grade II diastolic dysfunction (pseudonormalization).  LV Wall Scoring: The mid inferior segment is akinetic. Right Ventricle: The right ventricular size is normal. No increase in right ventricular wall thickness. Right ventricular systolic function is normal. Left Atrium: Left atrial size was mildly dilated. Right Atrium: Right atrial size was normal in size. Pericardium: There is no evidence of pericardial effusion. Mitral Valve: The mitral valve is grossly normal. Mild mitral valve regurgitation. No evidence of mitral valve stenosis. Tricuspid Valve: The tricuspid valve is normal in structure. Tricuspid valve regurgitation is not demonstrated. No evidence of tricuspid stenosis. Aortic Valve: The aortic valve is tricuspid. There is mild calcification of the aortic valve. Aortic valve regurgitation is mild. Aortic valve sclerosis is present, with no evidence of aortic valve stenosis. Pulmonic Valve: The pulmonic valve was normal in structure. Pulmonic valve regurgitation is not visualized. No evidence of pulmonic stenosis. Aorta: The aortic root is normal in size and structure. IAS/Shunts: No atrial level shunt detected by color flow Doppler.  LEFT VENTRICLE PLAX 2D LVIDd:         3.90 cm      Diastology LVIDs:         2.90 cm      LV e' medial:     4.24 cm/s LV PW:         1.20 cm      LV E/e' medial:  21.0 LV IVS:        1.10 cm      LV e' lateral:   8.81 cm/s LVOT diam:     2.00 cm      LV E/e' lateral: 10.1 LV SV:         71 LV SV Index:   38 LVOT Area:     3.14 cm  LV Volumes (MOD) LV vol d, MOD A2C: 88.3 ml LV vol d, MOD A4C: 106.0 ml LV vol s, MOD A2C: 38.7 ml LV vol s, MOD A4C: 52.7 ml LV SV MOD A2C:     49.6 ml LV SV MOD A4C:     106.0 ml LV SV MOD BP:      51.4 ml RIGHT VENTRICLE             IVC RV S prime:     10.80 cm/s  IVC diam: 1.90 cm TAPSE (M-mode): 2.2 cm LEFT ATRIUM             Index        RIGHT ATRIUM          Index LA diam:        4.00 cm 2.17 cm/m   RA Area:     9.83 cm LA Vol (A2C):   34.9 ml 18.92 ml/m  RA Volume:   20.50 ml 11.11 ml/m LA Vol (A4C):   38.6 ml 20.92 ml/m LA Biplane Vol: 36.5 ml 19.78 ml/m  AORTIC VALVE LVOT Vmax:   94.30 cm/s LVOT Vmean:  62.400 cm/s LVOT VTI:    0.225 m  AORTA  Ao Root diam: 3.30 cm MITRAL VALVE MV Area (PHT): 3.48 cm     SHUNTS MV Decel Time: 218 msec     Systemic VTI:  0.22 m MV E velocity: 89.10 cm/s   Systemic Diam: 2.00 cm MV A velocity: 101.00 cm/s MV E/A ratio:  0.88 Tanya Fuel MD Electronically signed by Tanya Fuel MD Signature Date/Time: 06/07/2023/9:11:10 AM    Final    CARDIAC CATHETERIZATION Result Date: 06/06/2023 3 vessel CAD. There is diffuse 50% narrowing of the proximal to mid LAD. The first diagonal is occluded. There is a high grade stenosis in the first OM. The RCA is a small vessel with 2 branches- one with 99% stenosis an then an 85% stenosis involving the other branch at the bifurcation Normal LV function Normal LVEDP Successful PCI of the first OM with DES Plan: DAPT for one year. Would treat the remainder of her disease medically.   DG Chest Portable 1 View Result Date: 06/05/2023 CLINICAL DATA:  Chest discomfort this morning. EXAM: PORTABLE CHEST 1 VIEW COMPARISON:  Radiographs 05/24/2022 and 10/13/2021. Chest CT 07/06/2017. FINDINGS: 1151 hours.  Lordotic positioning and mild patient rotation to the right. The heart size and mediastinal contours are stable. The lungs appear clear. No pleural effusion or pneumothorax. No acute osseous findings are seen. Telemetry leads overlie the chest. IMPRESSION: No evidence of acute cardiopulmonary process. Electronically Signed   By: Elsie Perone M.D.   On: 06/05/2023 12:43     Subjective: Patient seen examined bedside, resting calmly.  Lying in bed.  Chest pain-free.  Started on Imdur  by cardiology.  Discussed with cardiology, Dr. Sheena and PA; okay for discharge from their standpoint on Imdur  with outpatient follow-up scheduled on 07/03/2023.  Patient with no specific complaints, concerns or questions at this time.  Denies headache, no dizziness, no chest pain, no palpitations, no shortness of breath, no abdominal pain, no fever/chills/night sweats, no nausea/vomiting/diarrhea, no focal weakness, no fatigue, no paresthesia.  No acute events overnight per nursing.  Discharge Exam: Vitals:   06/20/23 1145 06/20/23 1230  BP: 119/60 138/67  Pulse: (!) 58 63  Resp: 15 (!) 22  Temp: 97.7 F (36.5 C)   SpO2: 97% 99%   Vitals:   06/20/23 0733 06/20/23 0745 06/20/23 1145 06/20/23 1230  BP:  (!) 117/55 119/60 138/67  Pulse:  63 (!) 58 63  Resp:  16 15 (!) 22  Temp: 97.7 F (36.5 C)  97.7 F (36.5 C)   TempSrc: Oral  Oral   SpO2:  97% 97% 99%  Weight:      Height:        Physical Exam: GEN: NAD, alert and oriented x 3, obese HEENT: NCAT, PERRL, EOMI, sclera clear, MMM PULM: CTAB w/o wheezes/crackles, normal respiratory effort, on room air CV: RRR w/o M/G/R GI: abd soft, NTND, NABS, no R/G/M MSK: no peripheral edema, muscle strength globally intact 5/5 bilateral upper/lower extremities NEURO: CN II-XII intact, no focal deficits, sensation to light touch intact PSYCH: normal mood/affect Integumentary: dry/intact, no rashes or wounds    The results of significant diagnostics from this  hospitalization (including imaging, microbiology, ancillary and laboratory) are listed below for reference.     Microbiology: No results found for this or any previous visit (from the past 240 hours).   Labs: BNP (last 3 results) Recent Labs    06/20/23 0415  BNP 235.5*   Basic Metabolic Panel: Recent Labs  Lab 06/19/23 1425 06/20/23 0022  NA 140 138  K 4.1 3.9  CL 109 107  CO2 23 25  GLUCOSE 123* 127*  BUN 14 15  CREATININE 0.85 1.12*  CALCIUM  9.0 8.5*  MG 2.1 2.2   Liver Function Tests: Recent Labs  Lab 06/20/23 0022  AST 27  ALT 20  ALKPHOS 85  BILITOT 0.5  PROT 5.6*  ALBUMIN 3.2*   Recent Labs  Lab 06/20/23 0022  LIPASE 28   No results for input(s): AMMONIA in the last 168 hours. CBC: Recent Labs  Lab 06/19/23 1425 06/20/23 0022  WBC 4.6 4.6  NEUTROABS  --  3.2  HGB 13.7 12.6  HCT 41.5 38.6  MCV 89.2 90.8  PLT 140* 123*   Cardiac Enzymes: No results for input(s): CKTOTAL, CKMB, CKMBINDEX, TROPONINI in the last 168 hours. BNP: Invalid input(s): POCBNP CBG: No results for input(s): GLUCAP in the last 168 hours. D-Dimer No results for input(s): DDIMER in the last 72 hours. Hgb A1c No results for input(s): HGBA1C in the last 72 hours. Lipid Profile No results for input(s): CHOL, HDL, LDLCALC, TRIG, CHOLHDL, LDLDIRECT in the last 72 hours. Thyroid  function studies Recent Labs    06/19/23 1425  TSH 3.402   Anemia work up No results for input(s): VITAMINB12, FOLATE, FERRITIN, TIBC, IRON, RETICCTPCT in the last 72 hours. Urinalysis    Component Value Date/Time   COLORURINE YELLOW 01/25/2021 1129   APPEARANCEUR CLEAR 01/25/2021 1129   LABSPEC 1.015 01/25/2021 1129   PHURINE 6.0 01/25/2021 1129   GLUCOSEU NEGATIVE 01/25/2021 1129   HGBUR NEGATIVE 01/25/2021 1129   BILIRUBINUR NEGATIVE 01/25/2021 1129   BILIRUBINUR Negative 10/08/2019 1211   KETONESUR NEGATIVE 01/25/2021 1129   PROTEINUR  Negative 10/08/2019 1211   PROTEINUR NEGATIVE 07/26/2011 0112   UROBILINOGEN 0.2 01/25/2021 1129   NITRITE NEGATIVE 01/25/2021 1129   LEUKOCYTESUR NEGATIVE 01/25/2021 1129   Sepsis Labs Recent Labs  Lab 06/19/23 1425 06/20/23 0022  WBC 4.6 4.6   Microbiology No results found for this or any previous visit (from the past 240 hours).   Time coordinating discharge: Over 30 minutes  SIGNED:   Camellia PARAS Kaiden Pech, DO  [facility] 06/20/2023, 1:07 PM

## 2023-06-20 NOTE — Progress Notes (Addendum)
 Patient Name: [name] Date of Encounter: 06/20/2023 [facility] Cardiologist: Arun K Thukkani, MD   Interval Summary  .    Patient well appearing this morning. Denies recurrent chest pain. No dyspnea reported. Patient also without headache, seemingly tolerating Imdur  well.   Vital Signs .    Vitals:   06/19/23 2006 06/20/23 0730 06/20/23 0733 06/20/23 0745  BP: (!) 151/60 (!) 115/59  (!) 117/55  Pulse: (!) 58 61  63  Resp: (!) 21 13  16   Temp:   97.7 F (36.5 C)   TempSrc:   Oral   SpO2: 99% 95%  97%  Weight:      Height:       No intake or output data in the 24 hours ending 06/20/23 0916    06/19/2023    2:23 PM 06/05/2023   11:23 PM 06/05/2023    9:39 PM  Last 3 Weights  Weight (lbs) 175 lb 179 lb 1.6 oz 179 lb 1.6 oz  Weight (kg) 79.379 kg 81.239 kg 81.239 kg      Telemetry/ECG    Sinus rhythm/sinus bradycardia - Personally Reviewed  Physical Exam .   GEN: No acute distress.   Neck: No JVD Cardiac: RRR, no murmurs, rubs, or gallops.  Respiratory: Clear to auscultation bilaterally. GI: Soft, nontender, non-distended  MS: No edema  Assessment & Plan .     Coronary artery disease  Patient recently admitted with chest pressure and jaw pain associated with nausea. She underwent LHC and was found with 3 vessel CAD, 50% narrowing of mid LAD, 95% of OM1 treated with PCI/DES x1. Does have residual disease of the RCA though small vessel to be treated medically. Recommendations for DAPT with ASA/plavix  for at least one year. Patient presented to the ED on 12/30 after experiencing chest pressure/burning. This was associated with dyspnea and nausea. Patient felt like symptoms mimicked those felt prior to above noted NSTEMI LHC. ED workup reassuring with no acute ischemic changes on ECG and negative troponin x2.   Patient's symptoms could be angina or GERD. Given reassuring cardiac workup, including stable echo on my review, recommend adding anti-anginal  meds. Patient a poor candidate for Metoprolol given lower resting HR. Notes from last admission report headache with Imdur  and thus it was not continued on discharge. However, patient received a dose last night and denies any headache today. Continue Imdur  30mg  daily at discharge. Consider adding daily PPI for GERD Continue ASA/Plavix , Vascepa  Patient has close follow up with cardiology on 1/13  Hyperlipidemia  LDL 108, HDL 30 satin intolerant, on vascepa . Pending referral to lipid clinic as an outpatient  LE edema  Previously reported as a chronic issue by patient. No hypervolemia noted on physical exam today.    For questions or updates, please contact [facility] Please consult [email or web address] for contact info under    Signed, Artist Pouch, PA-C   Patient seen and examined, note reviewed with the signed Advanced Practice Provider. I personally reviewed laboratory data, imaging studies and relevant notes. I independently examined the patient and formulated the important aspects of the plan. I have personally discussed the plan with the patient and/or family. Comments or changes to the note/plan are indicated below.   Clincally stable. Continue DAPT. She tolerate Imdur  continue this at discharge Agree with consideration of PPI Echo my reading - normal EF and normal wall motion Can be discharge from a CV standpoint.   Crystle Carelli DO, MS [facility] Attending Cardiologist  [facility]  [address] [place], [place] [address] [phone] Website: [email or web address]

## 2023-06-23 ENCOUNTER — Telehealth: Payer: Self-pay | Admitting: Internal Medicine

## 2023-06-23 NOTE — Telephone Encounter (Signed)
 Copied from CRM [id number]. Topic: Clinical - Medication Question >> Jun 23, 2023  9:58 AM Hector Shade B wrote: Reason for CRM: Kennedy Bucker for the fish oil  Patient states she needed to speak to the inhouse pharmacist about a grant for the fish oil

## 2023-06-25 NOTE — Progress Notes (Signed)
 Cardiology Office Note:  .   Date:  07/03/2023  ID:  [name], DOB 1941-04-07, MRN [id number] PCP: Joshua Debby CROME, MD  [facility] Providers Cardiologist:  Lurena MARLA Red, MD    Patient Profile: .      PMH Hypertension Coronary artery disease Remote cath 1996 with nonobstructive CAD Low risk lexiscan  myoview  01/18/2022 S/p NSTEMI 06/05/23 >>LHC 06/06/23 3 vessel CAD Diffuse 50% narrowing prox to mid LAD 1st diagonal is occluded High grade stenosis first OM Successful PCI with 2.25 x 20 mm Synergy DES RCA small vessel with 2 branches 1 branch with 99% stenosis 2nd branch 85% at bifurcation Normal LVEF TIA Obesity Fatty liver disease Hyperlipidemia Statin intolerance Allergic to ezetimibe  GERD Chronic hypokalemia Generalized anxiety disorder Hypothyroidism  Referred to cardiology and seen by Dr. Inocencio 11/2015 for hypertension, post cath 1996 showing 20 to 30% lesion in circumflex and RCA, syncope with no etiology discovered, and hyperlipidemia with intolerance of statins and ezetimibe .  Admission 06/05/2023 with sudden onset chest discomfort, left arm and jaw pain associated with nausea.  Upon EMS arrival, there was concern for ST elevation in inferior leads. Initial troponin at 41, peaked at 1336 with ECG that showed sinus bradycardia, prolonged QTc, nonspecific T wave changes.  Admitted for NSTEMI and underwent cardiac catheterization on 06/06/2023 which revealed three-vessel CAD, diffuse 50% narrowing of proximal to mid LAD, occlusion of first diagonal, high-grade stenosis in first OM, RCA is a small vessel with 2 branches -1 with 99% stenosis and then an 85% stenosis involving the other branch at the bifurcation, successful PCI first OM with DES, normal LV function and normal LVEDP.  Plan to treat disease of RCA medically.  Recommendation for DAPT with aspirin  and Plavix  for at least 1 year.  She developed a headache on Imdur  prior to discharge and it was  discontinued.  LDL was 108.  She is on Vascepa  and was referred to lipid clinic.  Echocardiogram revealed normal LVEF 50 to 55%, mild concentric LVH, G2 DD, normal RV, mildly dilated LA, mild MR, mild AI, aortic valve sclerosis without evidence of stenosis.  She returned to the ED on 12/30 after experiencing chest pressure/burning associated with dyspnea and nausea.  She felt that symptoms mimicked NSTEMI.  She took 3 doses of sublingual nitroglycerin  and symptoms improved.  ED workup was reassuring with no acute ischemia changes on ECG and negative troponin x 3.  Echo (limited) repeated 06/20/2023 revealed normal LVEF 55 to 60%, no RWMA, no significant valve disease, no pericardial effusion.  CXR with atelectasis of the right midlung otherwise no active cardiopulmonary disease.  She was restarted on Imdur  30 mg daily as well as Protonix  40 mg daily.       History of Present Illness: .   [name] is a very pleasatn 83 y.o. female who is here today with her daughter and great-granddaughter for hospital follow-up.  Reports she is feeling well since hospital dishcarge with no further episodes of chest pain. She is having persistent stomach discomfort that is constant. This has been present since second hospital visit, where her medications were adjusted. The patient is currently on Protonix  (pantoprazole ) for acid reflux prevention, although she reports never having experienced heartburn. Lengthy history of high cholesterol with statin intolerance. Has been on Vascepa  for about three years. However, she can only tolerate 1 gram twice daily rather than 2 grams which is prescribed due to severe cramps in her hands, feet, and legs. Had 3  falls prior to MI, wonders if it is related. One fall was witnessed and was clearly a mechanical fall with tripping over a trash bag. The other two were not witnessed. She denies any dizziness or unusual feelings in her head before the falls. She has resumed regular activities  of caring daily for her great granddaughter.  She is not having any shortness of breath, palpitations, orthopnea, PND, edema, presyncope, or syncope.   Discussed the use of AI scribe software for clinical note transcription with the patient, who gave verbal consent to proceed.   ROS: See HPI       Studies Reviewed: .        Risk Assessment/Calculations:            Physical Exam:   VS:  BP 122/60   Pulse 76   Ht 5' 3 (1.6 m)   Wt 180 lb 12.8 oz (82 kg)   SpO2 96%   BMI 32.03 kg/m    Wt Readings from Last 3 Encounters:  07/03/23 180 lb 12.8 oz (82 kg)  06/19/23 175 lb (79.4 kg)  06/05/23 179 lb 1.6 oz (81.2 kg)    GEN: Obese, well developed in no acute distress NECK: No JVD; No carotid bruits CARDIAC: RRR, no murmurs, rubs, gallops RESPIRATORY:  Clear to auscultation without rales, wheezing or rhonchi  ABDOMEN: Soft, non-tender, non-distended EXTREMITIES:  No edema; No deformity     ASSESSMENT AND PLAN: .    CAD without angina: NSTEMI 06/06/23 s/p PCI/DES of OM with residual diffuse disease of RCA  with plan to treat medically. She denies chest pain, dyspnea, or other symptoms concerning for angina.  No indication for further ischemic evaluation at this time. Plan for DAPT with ASA and Plavix  for at least 1 year.  She initially did not tolerate Imdur , however she is currently tolerating 30 mg daily. No bleeding concerns. She is interested in cardiac rehab. Continue Vascepa , ASA, Plavix  and Imdur .   Hypertension: BP initially mildly elevated at 138 mmHg systolic.  BP improved on my recheck.  I have asked her to monitor at home for goal BP < 130/80.   Hyperlipidemia LDL goal < 70: Lipid panel completed 06/05/2023 revealed total cholesterol 156, HDL 30, LDL 108, triglycerides 92. Intolerance to multiple statins with myalgia. Did not tolerate ezetimibe . Currently on Vascepa  but only able to tolerate 1 gram twice daily. Recommendation to start PCSK9 inhibitor to which she is  agreeable.  Will get assistance of pharmacy in determining agent that will be best covered by insurance. Continue Vascepa .   Stomach pain: Return admission 06/19/2023 with negative troponin x 3 and unremarkable echo.  She was given pantoprazole  for management of pain.  Reports no history of acid reflux.  She is having stomach discomfort since leaving the hospital.  I have encouraged her to gradually wean off the pantoprazole  by taking 1 pill every other day for 1 to 2 weeks and then 2-3 times a week for 1 week then discontinue. If symptoms of acid reflux worsen, recommend follow up with PCP.   Statin intolerance: Reports myalgias on multiple statins and intolerance of ezetimibe .      Cardiac Rehabilitation Eligibility Assessment  The patient is ready to start cardiac rehabilitation from a cardiac standpoint.       Dispo: 3 months with Dr. Wendel  Signed, Rosaline Bane, NP-C

## 2023-06-27 ENCOUNTER — Other Ambulatory Visit: Payer: Self-pay | Admitting: Pharmacist

## 2023-06-27 NOTE — Progress Notes (Signed)
   06/27/2023 Name: [name] MRN: [id number] DOB: January 12, 1941  Chief Complaint  Patient presents with   Vascepa  Lorrene    Received call from pt's daughter, Theoplis, needing help with getting [employer] grant re-enrolled for Vascepa . Called [employer] to get patient re-enrolled. This has been approved and Theoplis notified with updated ID # to present to the pharmacy. (ID: [id number])  Darrelyn Drum, PharmD, BCPS, CPP Clinical Pharmacist Practitioner [facility] [phone]

## 2023-06-27 NOTE — Telephone Encounter (Signed)
 This has now been addressed -  see encounter from today

## 2023-07-03 ENCOUNTER — Encounter: Payer: Self-pay | Admitting: Nurse Practitioner

## 2023-07-03 ENCOUNTER — Ambulatory Visit: Payer: PPO | Attending: Nurse Practitioner | Admitting: Nurse Practitioner

## 2023-07-03 VITALS — BP 122/60 | HR 76 | Ht 63.0 in | Wt 180.8 lb

## 2023-07-03 DIAGNOSIS — I2510 Atherosclerotic heart disease of native coronary artery without angina pectoris: Secondary | ICD-10-CM

## 2023-07-03 DIAGNOSIS — R109 Unspecified abdominal pain: Secondary | ICD-10-CM | POA: Diagnosis not present

## 2023-07-03 DIAGNOSIS — I214 Non-ST elevation (NSTEMI) myocardial infarction: Secondary | ICD-10-CM

## 2023-07-03 DIAGNOSIS — I10 Essential (primary) hypertension: Secondary | ICD-10-CM | POA: Diagnosis not present

## 2023-07-03 DIAGNOSIS — Z789 Other specified health status: Secondary | ICD-10-CM | POA: Diagnosis not present

## 2023-07-03 DIAGNOSIS — E785 Hyperlipidemia, unspecified: Secondary | ICD-10-CM | POA: Diagnosis not present

## 2023-07-03 MED ORDER — CLOPIDOGREL BISULFATE 75 MG PO TABS: 75.0000 mg | ORAL_TABLET | Freq: Every day | ORAL | 3 refills | Status: DC

## 2023-07-03 MED ORDER — ISOSORBIDE MONONITRATE ER 30 MG PO TB24: 30.0000 mg | ORAL_TABLET | Freq: Every day | ORAL | 3 refills | Status: DC

## 2023-07-03 MED ORDER — PANTOPRAZOLE SODIUM 40 MG PO TBEC: 40.0000 mg | DELAYED_RELEASE_TABLET | ORAL | 0 refills | Status: DC

## 2023-07-03 NOTE — Patient Instructions (Signed)
 Medication Instructions:   DECREASE Protonix  one (1) tablet by mouth ( 40 mg) every other day.   *If you need a refill on your cardiac medications before your next appointment, please call your pharmacy*   Lab Work:  None ordered.   If you have labs (blood work) drawn today and your tests are completely normal, you will receive your results only by: MyChart Message (if you have MyChart) OR A paper copy in the mail If you have any lab test that is abnormal or we need to change your treatment, we will call you to review the results.   Testing/Procedures:  None ordered.   Follow-Up: At [facility], you and your health needs are our priority.  As part of our continuing mission to provide you with exceptional heart care, we have created designated Provider Care Teams.  These Care Teams include your primary Cardiologist (physician) and Advanced Practice Providers (APPs -  Physician Assistants and Nurse Practitioners) who all work together to provide you with the care you need, when you need it.  We recommend signing up for the patient portal called MyChart.  Sign up information is provided on this After Visit Summary.  MyChart is used to connect with patients for Virtual Visits (Telemedicine).  Patients are able to view lab/test results, encounter notes, upcoming appointments, etc.  Non-urgent messages can be sent to your provider as well.   To learn more about what you can do with MyChart, go to [email or web address].    Your next appointment:   4 month(s)  Provider:   Lurena MARLA Red, MD     Other Instructions    1st Floor: - Lobby - Registration  - Pharmacy  - Lab - Cafe  2nd Floor: - PV Lab - Diagnostic Testing (echo, CT, nuclear med)  3rd Floor: - Vacant  4th Floor: - TCTS (cardiothoracic surgery) - AFib Clinic - Structural Heart Clinic - Vascular Surgery  - Vascular Ultrasound  5th Floor: - HeartCare Cardiology (general and EP) - Clinical  Pharmacy for coumadin, hypertension, lipid, weight-loss medications, and med management appointments    Valet parking services will be available as well.

## 2023-07-04 ENCOUNTER — Telehealth: Payer: Self-pay | Admitting: Pharmacy Technician

## 2023-07-04 ENCOUNTER — Other Ambulatory Visit (HOSPITAL_COMMUNITY): Payer: Self-pay

## 2023-07-04 DIAGNOSIS — D49512 Neoplasm of unspecified behavior of left kidney: Secondary | ICD-10-CM | POA: Diagnosis not present

## 2023-07-04 NOTE — Telephone Encounter (Signed)
-----   Message from Percy Rosaline HERO sent at 07/03/2023  7:23 PM EST ----- Please see which ERDX0p therapy patient will qualify for and will be least cost prohibitive.  Recent NSTEMI with stenting, residual disease of RCA to treat medically. Statin myalgia and intolerance of Zetia , only on Vascepa  currently.   Thank you, Rosaline

## 2023-07-04 NOTE — Telephone Encounter (Signed)
 Pharmacy Patient Advocate Encounter   Received notification from Physician's Office that prior authorization for repatha  is required/requested.   Insurance verification completed.   The patient is insured through [facility] ADVANTAGE/RX ADVANCE .   Per test claim: PA required; PA submitted to above mentioned insurance via CoverMyMeds Key/confirmation #/EOC [id number] Status is pending

## 2023-07-04 NOTE — Telephone Encounter (Signed)
 Pharmacy Patient Advocate Encounter  Received notification from [employer] that Prior Authorization for repatha  has been APPROVED from 07/04/23 to 12/31/23. Ran test claim, Copay is $47.00 one month. This test claim was processed through [facility]- copay amounts may vary at other pharmacies due to pharmacy/plan contracts, or as the patient moves through the different stages of their insurance plan.   PA #/Case ID/Reference #: [id number]

## 2023-07-05 ENCOUNTER — Ambulatory Visit (INDEPENDENT_AMBULATORY_CARE_PROVIDER_SITE_OTHER): Payer: PPO | Admitting: Family Medicine

## 2023-07-05 ENCOUNTER — Encounter: Payer: Self-pay | Admitting: Family Medicine

## 2023-07-05 ENCOUNTER — Telehealth (HOSPITAL_BASED_OUTPATIENT_CLINIC_OR_DEPARTMENT_OTHER): Payer: Self-pay | Admitting: *Deleted

## 2023-07-05 ENCOUNTER — Other Ambulatory Visit (HOSPITAL_BASED_OUTPATIENT_CLINIC_OR_DEPARTMENT_OTHER): Payer: Self-pay | Admitting: *Deleted

## 2023-07-05 VITALS — BP 134/80 | HR 66 | Temp 98.7°F | Ht 63.0 in | Wt 179.4 lb

## 2023-07-05 DIAGNOSIS — D696 Thrombocytopenia, unspecified: Secondary | ICD-10-CM | POA: Diagnosis not present

## 2023-07-05 DIAGNOSIS — I2510 Atherosclerotic heart disease of native coronary artery without angina pectoris: Secondary | ICD-10-CM

## 2023-07-05 DIAGNOSIS — K7689 Other specified diseases of liver: Secondary | ICD-10-CM

## 2023-07-05 DIAGNOSIS — E785 Hyperlipidemia, unspecified: Secondary | ICD-10-CM

## 2023-07-05 DIAGNOSIS — E782 Mixed hyperlipidemia: Secondary | ICD-10-CM

## 2023-07-05 DIAGNOSIS — I5032 Chronic diastolic (congestive) heart failure: Secondary | ICD-10-CM | POA: Diagnosis not present

## 2023-07-05 DIAGNOSIS — K2100 Gastro-esophageal reflux disease with esophagitis, without bleeding: Secondary | ICD-10-CM | POA: Diagnosis not present

## 2023-07-05 DIAGNOSIS — K7581 Nonalcoholic steatohepatitis (NASH): Secondary | ICD-10-CM | POA: Diagnosis not present

## 2023-07-05 LAB — CBC WITH DIFFERENTIAL/PLATELET
Basophils Absolute: 0 10*3/uL (ref 0.0–0.1)
Basophils Relative: 0.5 % (ref 0.0–3.0)
Eosinophils Absolute: 0.1 10*3/uL (ref 0.0–0.7)
Eosinophils Relative: 2.6 % (ref 0.0–5.0)
HCT: 43.2 % (ref 36.0–46.0)
Hemoglobin: 14 g/dL (ref 12.0–15.0)
Lymphocytes Relative: 20.4 % (ref 12.0–46.0)
Lymphs Abs: 0.9 10*3/uL (ref 0.7–4.0)
MCHC: 32.4 g/dL (ref 30.0–36.0)
MCV: 90.7 fL (ref 78.0–100.0)
Monocytes Absolute: 0.3 10*3/uL (ref 0.1–1.0)
Monocytes Relative: 6.8 % (ref 3.0–12.0)
Neutro Abs: 2.9 10*3/uL (ref 1.4–7.7)
Neutrophils Relative %: 69.7 % (ref 43.0–77.0)
Platelets: 146 10*3/uL — ABNORMAL LOW (ref 150.0–400.0)
RBC: 4.76 Mil/uL (ref 3.87–5.11)
RDW: 14.1 % (ref 11.5–15.5)
WBC: 4.2 10*3/uL (ref 4.0–10.5)

## 2023-07-05 LAB — COMPREHENSIVE METABOLIC PANEL
ALT: 17 U/L (ref 0–35)
AST: 23 U/L (ref 0–37)
Albumin: 4.4 g/dL (ref 3.5–5.2)
Alkaline Phosphatase: 106 U/L (ref 39–117)
BUN: 19 mg/dL (ref 6–23)
CO2: 29 meq/L (ref 19–32)
Calcium: 9.3 mg/dL (ref 8.4–10.5)
Chloride: 106 meq/L (ref 96–112)
Creatinine, Ser: 0.79 mg/dL (ref 0.40–1.20)
GFR: 69.67 mL/min (ref >60.00)
Glucose, Bld: 83 mg/dL (ref 70–99)
Potassium: 4 meq/L (ref 3.5–5.1)
Sodium: 141 meq/L (ref 135–145)
Total Bilirubin: 0.7 mg/dL (ref 0.2–1.2)
Total Protein: 7 g/dL (ref 6.0–8.3)

## 2023-07-05 LAB — BRAIN NATRIURETIC PEPTIDE: Pro B Natriuretic peptide (BNP): 244 pg/mL — ABNORMAL HIGH (ref 0.0–100.0)

## 2023-07-05 MED ORDER — REPATHA SURECLICK 140 MG/ML ~~LOC~~ SOAJ: 140.0000 mg | SUBCUTANEOUS | 3 refills | Status: DC

## 2023-07-05 MED ORDER — FAMOTIDINE 20 MG PO TABS: 20.0000 mg | ORAL_TABLET | Freq: Two times a day (BID) | ORAL | 0 refills | Status: DC

## 2023-07-05 NOTE — Telephone Encounter (Signed)
 S/w pt's daughter per [facility]) will start the Repatha  one injection every 14 days, placed order at requested pharmacy. Placed order and released for Lipid/Alt to get after 4 injections. Confirmed mailing address will place in mail today. Sent pharm d message via epic to inquired about a health grant for pt.  Will contact to see if pt receives.

## 2023-07-05 NOTE — Patient Instructions (Addendum)
 We are checking labs today, will be in contact with any results that require further attention  I have also sent in famotidine  for you to take once daily as needed for reflux  I would have you wear compression socks to help with swelling in your lower legs  Follow up with specialists as scheduled  Follow-up with me for new or worsening symptoms.

## 2023-07-05 NOTE — Progress Notes (Signed)
Acute Office Visit  Subjective:     Patient ID: [name], female    DOB: 1941/02/16, 83 y.o.   MRN: [id number]  Chief Complaint  Patient presents with   Hospitalization Follow-up    Hospitalization follow up (06/19/23) noting chest pain believing it was indigestion. Patient prescribed nitroglycerin daily which seems to be helping. Notes of heart attack on 06/05/23 which resulted in them providing stint placement.  Recent diagnosis of fatty liver and nodules from their urologist [facility]) yesterday.    HPI Follow up ER visit  Patient was seen in ER for chest pain on 06/19/23. She was treated for GERD with pantoprazole. She reports good compliance with treatment. She reports this condition is Improved. Of note, had NSTEMI on 06/05/23. Reports that she is healing well.  Reports that she will be seeing cardiology on Monday. CT findings during hospital visit are mildly nodular hepatic contour, suggesting her cirrhosis. She also is followed by GI. New medications include Repatha, she has not started this yet, she does not have the medication yet.  States they are working on prior authorization. Denies any chest pain, palpitations, dizziness, fatigue, numbness, tingling, other concerning symptoms today. Denies other concerns today.  -----------------------------------------------------------------------------------------   ROS Per HPI      Objective:    BP 134/80   Pulse 66   Temp 98.7 F (37.1 C)   Ht 5\' 3"  (1.6 m)   Wt 179 lb 6.4 oz (81.4 kg)   SpO2 99%   BMI 31.78 kg/m    Physical Exam Vitals and nursing note reviewed.  Constitutional:      General: She is not in acute distress.    Appearance: Normal appearance. She is obese.  HENT:     Head: Normocephalic and atraumatic.  Eyes:     Extraocular Movements: Extraocular movements intact.     Pupils: Pupils are equal, round, and reactive to light.  Cardiovascular:     Rate and Rhythm: Normal rate  and regular rhythm.     Heart sounds: Normal heart sounds.  Pulmonary:     Effort: Pulmonary effort is normal. No respiratory distress.     Breath sounds: Normal breath sounds. No wheezing, rhonchi or rales.  Musculoskeletal:        General: Normal range of motion.     Cervical back: Normal range of motion.  Lymphadenopathy:     Cervical: No cervical adenopathy.  Neurological:     General: No focal deficit present.     Mental Status: She is alert and oriented to person, place, and time.  Psychiatric:        Mood and Affect: Mood normal.        Thought Content: Thought content normal.    Results for orders placed or performed in visit on 07/05/23  Brain natriuretic peptide  Result Value Ref Range   Pro B Natriuretic peptide (BNP) 244.0 (H) 0.0 - 100.0 pg/mL  Comprehensive metabolic panel  Result Value Ref Range   Sodium 141 135 - 145 mEq/L   Potassium 4.0 3.5 - 5.1 mEq/L   Chloride 106 96 - 112 mEq/L   CO2 29 19 - 32 mEq/L   Glucose, Bld 83 70 - 99 mg/dL   BUN 19 6 - 23 mg/dL   Creatinine, Ser 4.09 0.40 - 1.20 mg/dL   Total Bilirubin 0.7 0.2 - 1.2 mg/dL   Alkaline Phosphatase 106 39 - 117 U/L   AST 23 0 - 37 U/L   ALT  17 0 - 35 U/L   Total Protein 7.0 6.0 - 8.3 g/dL   Albumin 4.4 3.5 - 5.2 g/dL   GFR 16.10 >96.04 mL/min   Calcium 9.3 8.4 - 10.5 mg/dL  CBC with Differential/Platelet  Result Value Ref Range   WBC 4.2 4.0 - 10.5 K/uL   RBC 4.76 3.87 - 5.11 Mil/uL   Hemoglobin 14.0 12.0 - 15.0 g/dL   HCT 54.0 98.1 - 19.1 %   MCV 90.7 78.0 - 100.0 fl   MCHC 32.4 30.0 - 36.0 g/dL   RDW 47.8 29.5 - 62.1 %   Platelets 146.0 (L) 150.0 - 400.0 K/uL   Neutrophils Relative % 69.7 43.0 - 77.0 %   Lymphocytes Relative 20.4 12.0 - 46.0 %   Monocytes Relative 6.8 3.0 - 12.0 %   Eosinophils Relative 2.6 0.0 - 5.0 %   Basophils Relative 0.5 0.0 - 3.0 %   Neutro Abs 2.9 1.4 - 7.7 K/uL   Lymphs Abs 0.9 0.7 - 4.0 K/uL   Monocytes Absolute 0.3 0.1 - 1.0 K/uL   Eosinophils Absolute  0.1 0.0 - 0.7 K/uL   Basophils Absolute 0.0 0.0 - 0.1 K/uL        Assessment & Plan:  1. Chronic diastolic CHF (congestive heart failure) (HCC) (Primary)  - Brain natriuretic peptide - Comprehensive metabolic panel  2. Coronary artery disease involving native coronary artery of native heart without angina pectoris  -Continue current medication regimen -Follow-up with cardiology as scheduled  3. Nonalcoholic steatohepatitis (NASH)  -Continue current medication regimen -Follow-up with GI as scheduled  4. Thrombocytopenia (HCC)  - CBC with Differential/Platelet  5. Gastroesophageal reflux disease with esophagitis without hemorrhage  - famotidine (PEPCID) 20 MG tablet; Take 1 tablet (20 mg total) by mouth 2 (two) times daily.  Dispense: 30 tablet; Refill: 0  6. Liver nodule  - Comprehensive metabolic panel  7. Mixed hyperlipidemia  -Continue current medication regimen -Take Repatha as prescribed once you receive it     Meds ordered this encounter  Medications   famotidine (PEPCID) 20 MG tablet    Sig: Take 1 tablet (20 mg total) by mouth 2 (two) times daily.    Dispense:  30 tablet    Refill:  0    Return for PCP as scheduled.  Moshe Cipro, FNP

## 2023-07-06 ENCOUNTER — Telehealth: Payer: Self-pay | Admitting: *Deleted

## 2023-07-06 NOTE — Telephone Encounter (Signed)
-----   Message from Olene Floss sent at 07/06/2023  7:15 AM EST ----- She already has a grant for the hypercholesterolemia fund with [employer]. She will use the same grant that she uses for Vascepa for Repatha ----- Message ----- From: Debbe Bales Sent: 07/05/2023   9:38 AM EST To: [facility] Pharm[facility]  This pt is interested in a [employer] grant please. Pt has two other grants from different medications.  Please advise.   Thanks, Nadina Fomby ----- Message ----- From: Levi Aland, NP Sent: 07/04/2023  12:48 PM EST To: Debbe Bales  Please notify pt cost will be $47.00 per month for Repatha. I would recommend that she start it and return for lipid and ALT after 4 injections. ----- Message ----- From: Art Buff, CPhT Sent: 07/04/2023  10:14 AM EST To: Levi Aland, NP  PA request has been Approved. New Encounter created for follow up. For additional info see Pharmacy Prior Auth telephone encounter from 07/04/23. ----- Message ----- From: Levi Aland, NP Sent: 07/03/2023   7:27 PM EST To: Rx Prior Auth Team  Please see which PCSK9i therapy patient will qualify for and will be least cost prohibitive.  Recent NSTEMI with stenting, residual disease of RCA to treat medically. Statin myalgia and intolerance of Zetia, only on Vascepa currently.   Thank you, Marcelino Duster

## 2023-07-06 NOTE — Telephone Encounter (Signed)
S/w pt's daughter per [facility]) is aware to call the pharmacy and the [employer] grant can be applied to the Repatha that was applied for the vascepa.

## 2023-07-07 ENCOUNTER — Encounter: Payer: Self-pay | Admitting: Family Medicine

## 2023-07-10 ENCOUNTER — Telehealth (HOSPITAL_COMMUNITY): Payer: Self-pay

## 2023-07-10 ENCOUNTER — Inpatient Hospital Stay: Admission: RE | Admit: 2023-07-10 | Payer: PPO | Source: Ambulatory Visit

## 2023-07-10 NOTE — Telephone Encounter (Signed)
Called patient to see if she was interested in participating in the Cardiac Rehab Program. Patient stated yes. Patient will come in for orientation on 07/11/23 @ 10:30AM and will attend the 10:15AM exercise class.   Pensions consultant.

## 2023-07-11 ENCOUNTER — Telehealth (HOSPITAL_COMMUNITY): Payer: Self-pay

## 2023-07-11 ENCOUNTER — Telehealth (HOSPITAL_COMMUNITY): Payer: Self-pay | Admitting: *Deleted

## 2023-07-11 ENCOUNTER — Inpatient Hospital Stay (HOSPITAL_COMMUNITY): Admission: RE | Admit: 2023-07-11 | Payer: PPO | Source: Ambulatory Visit

## 2023-07-11 NOTE — Telephone Encounter (Signed)
Pt insurance is active and benefits verified through [employer]. Co-pay $15.00, DED $0.00/$0.00 met, out of pocket $3,400.00/$20.00 met, co-insurance 0%. No pre-authorization required. Naved/[employer], 07/11/23 @ 8:23AM, BJY#[id number]   How many CR sessions are covered? (36 visits for TCR, 72 visits for ICR)72 Is this a lifetime maximum or an annual maximum? Annual Has the member used any of these services to date? No Is there a time limit (weeks/months) on start of program and/or program completion? No

## 2023-07-11 NOTE — Telephone Encounter (Signed)
Patient requested to reschedule orientation due to cold weather. Will forward her information back to Support Staff for rescheduling.

## 2023-07-17 ENCOUNTER — Inpatient Hospital Stay: Admission: RE | Admit: 2023-07-17 | Payer: PPO | Source: Ambulatory Visit

## 2023-07-18 ENCOUNTER — Ambulatory Visit (INDEPENDENT_AMBULATORY_CARE_PROVIDER_SITE_OTHER)
Admission: RE | Admit: 2023-07-18 | Discharge: 2023-07-18 | Disposition: A | Discharge status: Home / Self Care | Payer: PPO | Source: Ambulatory Visit | Attending: Internal Medicine | Admitting: Internal Medicine

## 2023-07-18 DIAGNOSIS — Z780 Asymptomatic menopausal state: Secondary | ICD-10-CM | POA: Diagnosis not present

## 2023-07-18 DIAGNOSIS — Z0000 Encounter for general adult medical examination without abnormal findings: Secondary | ICD-10-CM

## 2023-07-19 ENCOUNTER — Ambulatory Visit (HOSPITAL_COMMUNITY): Payer: PPO

## 2023-07-21 ENCOUNTER — Ambulatory Visit (HOSPITAL_COMMUNITY): Payer: PPO

## 2023-07-24 ENCOUNTER — Ambulatory Visit (HOSPITAL_COMMUNITY): Payer: PPO

## 2023-07-26 ENCOUNTER — Telehealth (HOSPITAL_COMMUNITY): Payer: Self-pay

## 2023-07-26 ENCOUNTER — Ambulatory Visit (HOSPITAL_COMMUNITY): Payer: PPO

## 2023-07-26 NOTE — Telephone Encounter (Signed)
 Called and spoke with pt in regards to CR, pt stated she is not able to afford the co-pay. Adv pt she could do 2 days a week, pt stated she is still not able to afford the co-pay at this time.   Closed referral

## 2023-07-28 ENCOUNTER — Ambulatory Visit (HOSPITAL_COMMUNITY): Payer: PPO

## 2023-07-31 ENCOUNTER — Ambulatory Visit (HOSPITAL_COMMUNITY): Payer: PPO

## 2023-08-02 ENCOUNTER — Ambulatory Visit: Payer: PPO | Admitting: Internal Medicine

## 2023-08-02 ENCOUNTER — Encounter: Payer: Self-pay | Admitting: Internal Medicine

## 2023-08-02 VITALS — BP 124/70 | HR 70 | Temp 98.1°F | Ht 63.0 in | Wt 182.0 lb

## 2023-08-02 DIAGNOSIS — K582 Mixed irritable bowel syndrome: Secondary | ICD-10-CM

## 2023-08-02 DIAGNOSIS — E039 Hypothyroidism, unspecified: Secondary | ICD-10-CM

## 2023-08-02 DIAGNOSIS — E538 Deficiency of other specified B group vitamins: Secondary | ICD-10-CM | POA: Diagnosis not present

## 2023-08-02 DIAGNOSIS — I10 Essential (primary) hypertension: Secondary | ICD-10-CM | POA: Diagnosis not present

## 2023-08-02 DIAGNOSIS — F411 Generalized anxiety disorder: Secondary | ICD-10-CM

## 2023-08-02 DIAGNOSIS — D696 Thrombocytopenia, unspecified: Secondary | ICD-10-CM | POA: Diagnosis not present

## 2023-08-02 DIAGNOSIS — E781 Pure hyperglyceridemia: Secondary | ICD-10-CM

## 2023-08-02 LAB — CBC WITH DIFFERENTIAL/PLATELET
Basophils Absolute: 0 10*3/uL (ref 0.0–0.1)
Basophils Relative: 0.3 % (ref 0.0–3.0)
Eosinophils Absolute: 0.1 10*3/uL (ref 0.0–0.7)
Eosinophils Relative: 1.8 % (ref 0.0–5.0)
HCT: 42.3 % (ref 36.0–46.0)
Hemoglobin: 13.9 g/dL (ref 12.0–15.0)
Lymphocytes Relative: 19.5 % (ref 12.0–46.0)
Lymphs Abs: 1 10*3/uL (ref 0.7–4.0)
MCHC: 32.9 g/dL (ref 30.0–36.0)
MCV: 90.5 fL (ref 78.0–100.0)
Monocytes Absolute: 0.4 10*3/uL (ref 0.1–1.0)
Monocytes Relative: 6.9 % (ref 3.0–12.0)
Neutro Abs: 3.7 10*3/uL (ref 1.4–7.7)
Neutrophils Relative %: 71.5 % (ref 43.0–77.0)
Platelets: 146 10*3/uL — ABNORMAL LOW (ref 150.0–400.0)
RBC: 4.67 Mil/uL (ref 3.87–5.11)
RDW: 14.3 % (ref 11.5–15.5)
WBC: 5.2 10*3/uL (ref 4.0–10.5)

## 2023-08-02 LAB — FOLATE: Folate: >25.2 ng/mL (ref >5.9)

## 2023-08-02 MED ORDER — VASCEPA 1 G PO CAPS: ORAL_CAPSULE | ORAL | 0 refills | Status: DC

## 2023-08-02 MED ORDER — FUROSEMIDE 20 MG PO TABS
20.0000 mg | ORAL_TABLET | Freq: Every morning | ORAL | 0 refills | Status: DC
Start: 2023-08-02 — End: 2024-01-08

## 2023-08-02 MED ORDER — ESCITALOPRAM OXALATE 20 MG PO TABS
20.0000 mg | ORAL_TABLET | Freq: Every day | ORAL | 0 refills | Status: DC
Start: 2023-08-02 — End: 2023-12-20

## 2023-08-02 MED ORDER — POTASSIUM CHLORIDE CRYS ER 20 MEQ PO TBCR: EXTENDED_RELEASE_TABLET | ORAL | 0 refills | Status: DC

## 2023-08-02 MED ORDER — ALPRAZOLAM 0.5 MG PO TABS: 0.5000 mg | ORAL_TABLET | Freq: Three times a day (TID) | ORAL | 0 refills | Status: DC | PRN

## 2023-08-02 MED ORDER — DICYCLOMINE HCL 10 MG PO CAPS: ORAL_CAPSULE | ORAL | 0 refills | Status: DC

## 2023-08-02 MED ORDER — LEVOTHYROXINE SODIUM 25 MCG PO TABS: 25.0000 ug | ORAL_TABLET | Freq: Every day | ORAL | 0 refills | Status: DC

## 2023-08-02 NOTE — Patient Instructions (Signed)
Thrombocytopenia Thrombocytopenia means that you have a low number of platelets in your blood. Platelets are tiny cells in the blood. When you bleed, they clump together at the cut or injury to stop the bleeding. This is called blood clotting. If you do not have enough platelets, your blood may have trouble clotting. This may cause you to bleed and bruise very easily. What are the causes? This condition is caused by a low number of platelets in your blood. There are three main reasons for this: Your body not making enough platelets. This may be caused by: Bone marrow diseases. Disorders that are passed from parent to child (inherited). Certain cancer medicines or treatments. Infection from germs (bacteria or viruses). Alcoholism. Platelets not being released in the blood. This can be caused by: Having a spleen that is larger than normal. A condition called Gaucher disease. Your body destroying platelets too quickly. This may be caused by: Certain autoimmune diseases. Some medicines that thin your blood. Certain blood clotting disorders. Certain bleeding disorders. Exposure to harmful (toxic) chemicals. Pregnancy. What are the signs or symptoms? Bruising easily. Bleeding from the nose or mouth. Heavy menstrual periods. Blood in the pee (urine), poop (stool), or vomit. A purple-red color to the skin (purpura). A rash that looks like pinpoint, purple-red spots (petechiae) on the lower legs. How is this treated? Treatment depends on the cause. Treatment may include: Treatment of another condition that is causing the low platelet count. Medicines to help protect your platelets from being destroyed. A replacement (transfusion) of platelets to stop or prevent bleeding. Surgery to take out the spleen. Follow these instructions at home: Medicines Take over-the-counter and prescription medicines only as told by your doctor. Do not take any medicines that have aspirin or NSAIDs, such as  ibuprofen. Activity Avoid doing things that could hurt or bruise you. Take action to prevent falls. Do not play contact sports. Ask your doctor what activities are safe for you. Take care not to burn yourself: When you use an iron. When you cook. Take care not to cut yourself: When you shave. When you use scissors, needles, knives, or other tools. General instructions  Check your skin and the inside of your mouth for bruises or blood as told by your doctor. Wear a medical alert bracelet that says that you have a bleeding disorder. Check to see if there is blood in your pee and poop. Do this as told by your doctor. Do not drink alcohol. If you do drink, limit the amount that you drink. Stay away from harmful (toxic) chemicals. Tell all of your doctors that you have this condition. Be sure to tell your dentist and eye doctor. Tell your dentist about your condition before you have your teeth cleaned. Keep all follow-up visits. Contact a doctor if: You have bruises and you do not know why. You have new symptoms. You have symptoms that get worse. You have a fever. Get help right away if: You have very bad bleeding anywhere on your body. You have blood in your vomit, pee, or poop. You have an injury to your head. You have a sudden, very bad headache. Summary Thrombocytopenia means that you have a low number of platelets in your blood. Platelets stick together to form a clot. Symptoms of this condition include getting bruises easily, bleeding from the mouth and nose, a purple-red color to the skin, and a rash. Take care not to cut or burn yourself. This information is not intended to replace advice given  to you by your health care provider. Make sure you discuss any questions you have with your health care provider. Document Revised: 11/19/2020 Document Reviewed: 11/19/2020 Elsevier Patient Education  2024 [employer].

## 2023-08-02 NOTE — Progress Notes (Unsigned)
Subjective:  Patient ID: [name], female    DOB: Jan 19, 1941  Age: 83 y.o. MRN: [id number]  CC: Coronary Artery Disease and Congestive Heart Failure   HPI [name] presents for f/up ---  Discussed the use of AI scribe software for clinical note transcription with the patient, who gave verbal consent to proceed.  History of Present Illness   [name] is an 83 year old female with heart failure who presents for medication management and follow-up.  She experiences fatigue and shortness of breath, which resolves upon sitting down. No current chest pain is reported. She has a history of heart failure following a myocardial infarction.  She manages leg swelling, which occurs at night, by wearing socks. She has gained approximately 11 pounds since her last hospital visit, attributing it to non-fluid weight gain.  She notes significant bruising, stating 'it's all over me,' and mentions a specific incident where she hit a door, resulting in a large cut. This occurred on Monday, and the bruising is improving. No other bleeding issues are reported.  She has a sore on her foot that has been present for a long time but recently flared up. She has an appointment with a dermatologist on March 12th for further evaluation. She previously had basal cell carcinomas removed from her nose by the dermatologist.  Her current medications include potassium, a fish oil supplement, a diuretic, Xanax, stomach pills, a baby aspirin, and Repatha injections. Protonix was discontinued due to stomach discomfort. She is uncertain about other medications prescribed during her hospital stay.       Outpatient Medications Prior to Visit  Medication Sig Dispense Refill   acetaminophen (TYLENOL) 500 MG tablet Take 500 mg by mouth every 6 (six) hours as needed for moderate pain (pain score 4-6).     aspirin EC 81 MG tablet Take 1 tablet (81 mg total) by mouth daily. Swallow whole. 30 tablet 12   brimonidine  (ALPHAGAN P) 0.1 % SOLN Place 1 drop into both eyes 4 (four) times daily.     clopidogrel (PLAVIX) 75 MG tablet Take 1 tablet (75 mg total) by mouth daily with breakfast. 90 tablet 3   Evolocumab (REPATHA SURECLICK) 140 MG/ML SOAJ Inject 140 mg into the skin every 14 (fourteen) days. 6 mL 3   famotidine (PEPCID) 20 MG tablet Take 1 tablet (20 mg total) by mouth 2 (two) times daily. 30 tablet 0   isosorbide mononitrate (IMDUR) 30 MG 24 hr tablet Take 1 tablet (30 mg total) by mouth daily. 90 tablet 3   nystatin (MYCOSTATIN/NYSTOP) powder Apply 1 Application topically 3 (three) times daily. (Patient taking differently: Apply 1 Application topically daily as needed (dry skin).) 15 g 0   ALPRAZolam (XANAX) 0.5 MG tablet Take 1 tablet (0.5 mg total) by mouth 3 (three) times daily as needed. for anxiety 270 tablet 0   dicyclomine (BENTYL) 10 MG capsule TAKE 1 CAPSULE BY MOUTH 4 TIMES DAILY BEFORE MEAL(S) AND AT BEDTIME 360 capsule 0   escitalopram (LEXAPRO) 20 MG tablet Take 1 tablet (20 mg total) by mouth at bedtime. 90 tablet 0   furosemide (LASIX) 20 MG tablet TAKE 1 TABLET BY MOUTH IN THE MORNING 90 tablet 0   levothyroxine (SYNTHROID) 25 MCG tablet Take 1 tablet (25 mcg total) by mouth daily before breakfast. 90 tablet 0   potassium chloride SA (KLOR-CON M) 20 MEQ tablet Take 1 tablet by mouth twice daily 180 tablet 0   VASCEPA 1  g capsule TAKE 2 CAPSULES BY MOUTH IN THE MORNING AND 2 AT BEDTIME (Patient taking differently: Take 1 g by mouth 2 (two) times daily.) 360 capsule 0   pantoprazole (PROTONIX) 40 MG tablet Take 1 tablet (40 mg total) by mouth every other day. 90 tablet 0   No facility-administered medications prior to visit.    ROS Review of Systems  Constitutional:  Positive for unexpected weight change. Negative for fatigue.  Respiratory:  Negative for chest tightness, shortness of breath and wheezing.   Cardiovascular:  Negative for chest pain, palpitations and leg swelling.   Gastrointestinal: Negative.  Negative for abdominal pain, constipation, diarrhea, nausea and vomiting.  Musculoskeletal: Negative.   Skin: Negative.   Hematological:  Negative for adenopathy. Bruises/bleeds easily.    Objective:  BP 124/70 (BP Location: Left Arm, Patient Position: Sitting, Cuff Size: Normal)   Pulse 70   Temp 98.1 F (36.7 C) (Oral)   Ht 5\' 3"  (1.6 m)   Wt 182 lb (82.6 kg)   SpO2 93%   BMI 32.24 kg/m   BP Readings from Last 3 Encounters:  08/02/23 124/70  07/05/23 134/80  07/03/23 122/60    Wt Readings from Last 3 Encounters:  08/02/23 182 lb (82.6 kg)  07/05/23 179 lb 6.4 oz (81.4 kg)  07/03/23 180 lb 12.8 oz (82 kg)    Physical Exam Vitals reviewed.  Constitutional:      General: She is not in acute distress.    Appearance: She is not ill-appearing, toxic-appearing or diaphoretic.  HENT:     Nose: Nose normal.     Mouth/Throat:     Mouth: Mucous membranes are moist.  Eyes:     General: No scleral icterus.    Conjunctiva/sclera: Conjunctivae normal.  Cardiovascular:     Rate and Rhythm: Normal rate and regular rhythm.     Heart sounds: No murmur heard.    No friction rub. No gallop.  Pulmonary:     Effort: Pulmonary effort is normal.     Breath sounds: No stridor. No wheezing, rhonchi or rales.  Abdominal:     General: Abdomen is protuberant. Bowel sounds are normal. There is no distension.     Palpations: Abdomen is soft. There is no hepatomegaly, splenomegaly or mass.     Tenderness: There is no abdominal tenderness.  Musculoskeletal:     Cervical back: Neck supple.  Lymphadenopathy:     Cervical: No cervical adenopathy.  Skin:    Findings: Bruising present.  Neurological:     General: No focal deficit present.     Mental Status: She is alert.  Psychiatric:        Mood and Affect: Mood normal.        Behavior: Behavior normal.          Lab Results  Component Value Date   WBC 5.2 08/02/2023   HGB 13.9 08/02/2023   HCT  42.3 08/02/2023   PLT 146.0 (L) 08/02/2023   GLUCOSE 83 07/05/2023   CHOL 156 06/05/2023   TRIG 92 06/05/2023   HDL 30 (L) 06/05/2023   LDLDIRECT 174.0 02/27/2019   LDLCALC 108 (H) 06/05/2023   ALT 17 07/05/2023   AST 23 07/05/2023   NA 141 07/05/2023   K 4.0 07/05/2023   CL 106 07/05/2023   CREATININE 0.79 07/05/2023   BUN 19 07/05/2023   CO2 29 07/05/2023   TSH 3.402 06/19/2023   INR 0.98 02/28/2017   HGBA1C 5.6 10/29/2020    DG Bone  Density Result Date: 07/18/2023 Table formatting from the original result was not included. Date of study: 07/18/2023 Exam: DUAL X-RAY ABSORPTIOMETRY (DXA) FOR BONE MINERAL DENSITY (BMD) Instrument: [employer] Requesting Provider: PCP Indication: follow up for low BMD Comparison: 12/16/2009 Clinical data: Pt is a 83 y.o. female without previous history of fracture. On vitamin D. Results:  Lumbar spine L1- L3 (L4) Femoral neck (FN) 33% distal radius T-score -1.0 RFN: -1.6 LFN: -1.6 n/a Change in BMD from previous DXA test (%) -3.7%* -10.7%* n/a (*) statistically significant Assessment: the BMD is low according to the [facility] classification for osteoporosis (see below). Fracture risk: moderate FRAX score: 10 year major osteoporotic risk: 13.5%. 10 year hip fracture risk: 3.6%. The thresholds for treatment are 20% and 3%, respectively. Comments: the technical quality of the study is good. Evaluation for secondary causes should be considered if clinically indicated. Recommend optimizing calcium (1200 mg/day) and vitamin D (800 IU/day) intake. Followup: Repeat BMD is appropriate after 2 years or after 1-2 years if starting treatment. WHO criteria for diagnosis of osteoporosis in postmenopausal women and in men 47 y/o or older: - normal: T-score -1.0 to + 1.0 - osteopenia/low bone density: T-score between -2.5 and -1.0 - osteoporosis: T-score below -2.5 - severe osteoporosis: T-score below -2.5 with history of fragility fracture Note: although not part of the WHO  classification, the presence of a fragility fracture, regardless of the T-score, should be considered diagnostic of osteoporosis, provided other causes for the fracture have been excluded. Treatment: The National Osteoporosis Foundation recommends that treatment be considered in postmenopausal women and men age 19 or older with: 1. Hip or vertebral (clinical or morphometric) fracture 2. T-score of - 2.5 or lower at the spine or hip 3. 10-year fracture probability by FRAX of at least 20% for a major osteoporotic fracture and 3% for a hip fracture Carlus Pavlov, MD [facility]    Assessment & Plan:  Thrombocytopenia (HCC) -     CBC with Differential/Platelet; Future -     Folate; Future  B12 deficiency -     CBC with Differential/Platelet; Future -     Folate; Future  GAD (generalized anxiety disorder) -     Escitalopram Oxalate; Take 1 tablet (20 mg total) by mouth at bedtime.  Dispense: 90 tablet; Refill: 0 -     ALPRAZolam; Take 1 tablet (0.5 mg total) by mouth 3 (three) times daily as needed. for anxiety  Dispense: 270 tablet; Refill: 0  Irritable bowel syndrome with both constipation and diarrhea -     Dicyclomine HCl; TAKE 1 CAPSULE BY MOUTH 4 TIMES DAILY BEFORE MEAL(S) AND AT BEDTIME  Dispense: 360 capsule; Refill: 0  Essential hypertension -     Furosemide; Take 1 tablet (20 mg total) by mouth every morning.  Dispense: 90 tablet; Refill: 0 -     Potassium Chloride Crys ER; Take 1 tablet by mouth twice daily  Dispense: 180 tablet; Refill: 0  Acquired hypothyroidism -     Levothyroxine Sodium; Take 1 tablet (25 mcg total) by mouth daily before breakfast.  Dispense: 90 tablet; Refill: 0  Pure hypertriglyceridemia -     Vascepa; TAKE 2 CAPSULES BY MOUTH IN THE MORNING AND 2 AT BEDTIME  Dispense: 360 capsule; Refill: 0     Follow-up: Return in about 3 months (around 10/30/2023).  Sanda Linger, MD

## 2023-08-04 ENCOUNTER — Ambulatory Visit (HOSPITAL_COMMUNITY): Payer: PPO

## 2023-08-07 ENCOUNTER — Ambulatory Visit (HOSPITAL_COMMUNITY): Payer: PPO

## 2023-08-09 ENCOUNTER — Ambulatory Visit (HOSPITAL_COMMUNITY): Payer: PPO

## 2023-08-10 ENCOUNTER — Encounter: Payer: Self-pay | Admitting: Internal Medicine

## 2023-08-11 ENCOUNTER — Ambulatory Visit (HOSPITAL_COMMUNITY): Payer: PPO

## 2023-08-14 ENCOUNTER — Ambulatory Visit (HOSPITAL_COMMUNITY): Payer: PPO

## 2023-08-15 DIAGNOSIS — H401131 Primary open-angle glaucoma, bilateral, mild stage: Secondary | ICD-10-CM | POA: Diagnosis not present

## 2023-08-15 DIAGNOSIS — H5211 Myopia, right eye: Secondary | ICD-10-CM | POA: Diagnosis not present

## 2023-08-15 DIAGNOSIS — Z961 Presence of intraocular lens: Secondary | ICD-10-CM | POA: Diagnosis not present

## 2023-08-15 DIAGNOSIS — H5202 Hypermetropia, left eye: Secondary | ICD-10-CM | POA: Diagnosis not present

## 2023-08-16 ENCOUNTER — Ambulatory Visit (HOSPITAL_COMMUNITY): Payer: PPO

## 2023-08-18 ENCOUNTER — Ambulatory Visit (HOSPITAL_COMMUNITY): Payer: PPO

## 2023-08-21 ENCOUNTER — Ambulatory Visit (HOSPITAL_COMMUNITY): Payer: PPO

## 2023-08-21 ENCOUNTER — Other Ambulatory Visit: Payer: Self-pay | Admitting: Family Medicine

## 2023-08-21 DIAGNOSIS — K2100 Gastro-esophageal reflux disease with esophagitis, without bleeding: Secondary | ICD-10-CM

## 2023-08-23 ENCOUNTER — Ambulatory Visit (HOSPITAL_COMMUNITY): Payer: PPO

## 2023-08-25 ENCOUNTER — Ambulatory Visit (HOSPITAL_COMMUNITY): Payer: PPO

## 2023-08-28 ENCOUNTER — Ambulatory Visit (HOSPITAL_COMMUNITY): Payer: PPO

## 2023-08-29 DIAGNOSIS — D1801 Hemangioma of skin and subcutaneous tissue: Secondary | ICD-10-CM | POA: Diagnosis not present

## 2023-08-29 DIAGNOSIS — L821 Other seborrheic keratosis: Secondary | ICD-10-CM | POA: Diagnosis not present

## 2023-08-29 DIAGNOSIS — D485 Neoplasm of uncertain behavior of skin: Secondary | ICD-10-CM | POA: Diagnosis not present

## 2023-08-29 DIAGNOSIS — D0471 Carcinoma in situ of skin of right lower limb, including hip: Secondary | ICD-10-CM | POA: Diagnosis not present

## 2023-08-29 DIAGNOSIS — L578 Other skin changes due to chronic exposure to nonionizing radiation: Secondary | ICD-10-CM | POA: Diagnosis not present

## 2023-08-29 DIAGNOSIS — D229 Melanocytic nevi, unspecified: Secondary | ICD-10-CM | POA: Diagnosis not present

## 2023-08-29 DIAGNOSIS — L814 Other melanin hyperpigmentation: Secondary | ICD-10-CM | POA: Diagnosis not present

## 2023-08-29 DIAGNOSIS — D040 Carcinoma in situ of skin of lip: Secondary | ICD-10-CM | POA: Diagnosis not present

## 2023-08-29 DIAGNOSIS — L570 Actinic keratosis: Secondary | ICD-10-CM | POA: Diagnosis not present

## 2023-08-30 ENCOUNTER — Ambulatory Visit (HOSPITAL_COMMUNITY): Payer: PPO

## 2023-09-01 ENCOUNTER — Ambulatory Visit (HOSPITAL_COMMUNITY): Payer: PPO

## 2023-09-04 ENCOUNTER — Ambulatory Visit (HOSPITAL_COMMUNITY): Payer: PPO

## 2023-09-04 DIAGNOSIS — I2510 Atherosclerotic heart disease of native coronary artery without angina pectoris: Secondary | ICD-10-CM | POA: Diagnosis not present

## 2023-09-04 DIAGNOSIS — E785 Hyperlipidemia, unspecified: Secondary | ICD-10-CM | POA: Diagnosis not present

## 2023-09-04 LAB — LIPID PANEL
Chol/HDL Ratio: 3.5 ratio (ref 0.0–4.4)
Cholesterol, Total: 173 mg/dL (ref 100–199)
HDL: 49 mg/dL (ref >39)
LDL Chol Calc (NIH): 109 mg/dL — ABNORMAL HIGH (ref 0–99)
Triglycerides: 77 mg/dL (ref 0–149)
VLDL Cholesterol Cal: 15 mg/dL (ref 5–40)

## 2023-09-04 LAB — ALT: ALT: 20 IU/L (ref 0–32)

## 2023-09-05 ENCOUNTER — Telehealth: Payer: Self-pay | Admitting: Pharmacist

## 2023-09-05 ENCOUNTER — Ambulatory Visit: Payer: PPO

## 2023-09-05 NOTE — Telephone Encounter (Signed)
 I called patient because she was referred to lipid clinic and had an appointment today scheduled however she was started on Repatha by Eligha Bridegroom in January.  While speaking to patient on the phone I realized that she had labs drawn yesterday which showed an LDL-C unchanged from 3 months ago.  We reviewed injection technique.  Does appear as though she is injecting correctly. patient injecting in her belly, does not want to inject in her thighs.  She does admit that she missed a dose and went 3 weeks before her next injection.  This was about 5 weeks ago.  Her last dose was on Friday.  She is given a total of 4 injections.  Patient probably not at steady state, with missed dose this could explain lack of response.  Given her intolerance to statins and Zetia, I have asked patient to continue giving Repatha every 2 weeks.  She will get repeat labs a few days prior to her appointment in May with Dr. Lynnette Caffey.

## 2023-09-06 ENCOUNTER — Telehealth (INDEPENDENT_AMBULATORY_CARE_PROVIDER_SITE_OTHER): Payer: Self-pay | Admitting: Otolaryngology

## 2023-09-06 ENCOUNTER — Encounter (INDEPENDENT_AMBULATORY_CARE_PROVIDER_SITE_OTHER): Payer: Self-pay

## 2023-09-06 ENCOUNTER — Ambulatory Visit (HOSPITAL_COMMUNITY): Payer: PPO

## 2023-09-06 NOTE — Telephone Encounter (Signed)
 LVM & sent MyChart message to r/s appt - Dr. Suszanne Conners not in office. [phone] afm

## 2023-09-08 ENCOUNTER — Ambulatory Visit (HOSPITAL_COMMUNITY): Payer: PPO

## 2023-09-11 ENCOUNTER — Ambulatory Visit (HOSPITAL_COMMUNITY): Payer: PPO

## 2023-09-13 ENCOUNTER — Ambulatory Visit (HOSPITAL_COMMUNITY): Payer: PPO

## 2023-09-15 ENCOUNTER — Ambulatory Visit (HOSPITAL_COMMUNITY): Payer: PPO

## 2023-09-18 ENCOUNTER — Ambulatory Visit (HOSPITAL_COMMUNITY): Payer: PPO

## 2023-09-20 ENCOUNTER — Ambulatory Visit (HOSPITAL_COMMUNITY): Payer: PPO

## 2023-09-21 DIAGNOSIS — C4402 Squamous cell carcinoma of skin of lip: Secondary | ICD-10-CM | POA: Diagnosis not present

## 2023-09-22 ENCOUNTER — Ambulatory Visit (HOSPITAL_COMMUNITY): Payer: PPO

## 2023-09-25 ENCOUNTER — Ambulatory Visit (HOSPITAL_COMMUNITY): Payer: PPO

## 2023-09-27 ENCOUNTER — Ambulatory Visit (HOSPITAL_COMMUNITY): Payer: PPO

## 2023-09-29 ENCOUNTER — Ambulatory Visit (HOSPITAL_COMMUNITY): Payer: PPO

## 2023-10-02 ENCOUNTER — Ambulatory Visit (HOSPITAL_COMMUNITY): Payer: PPO

## 2023-10-04 ENCOUNTER — Ambulatory Visit (HOSPITAL_COMMUNITY): Payer: PPO

## 2023-10-04 DIAGNOSIS — C44722 Squamous cell carcinoma of skin of right lower limb, including hip: Secondary | ICD-10-CM | POA: Diagnosis not present

## 2023-10-04 DIAGNOSIS — L739 Follicular disorder, unspecified: Secondary | ICD-10-CM | POA: Diagnosis not present

## 2023-10-06 ENCOUNTER — Ambulatory Visit (INDEPENDENT_AMBULATORY_CARE_PROVIDER_SITE_OTHER): Payer: PPO

## 2023-10-23 DIAGNOSIS — L929 Granulomatous disorder of the skin and subcutaneous tissue, unspecified: Secondary | ICD-10-CM | POA: Diagnosis not present

## 2023-10-23 DIAGNOSIS — L570 Actinic keratosis: Secondary | ICD-10-CM | POA: Diagnosis not present

## 2023-10-31 ENCOUNTER — Encounter: Payer: Self-pay | Admitting: Internal Medicine

## 2023-10-31 ENCOUNTER — Ambulatory Visit: Payer: Self-pay | Admitting: Internal Medicine

## 2023-10-31 ENCOUNTER — Ambulatory Visit: Payer: PPO | Admitting: Internal Medicine

## 2023-10-31 VITALS — BP 122/68 | HR 62 | Temp 98.1°F | Resp 16 | Ht 63.0 in | Wt 179.6 lb

## 2023-10-31 DIAGNOSIS — E538 Deficiency of other specified B group vitamins: Secondary | ICD-10-CM

## 2023-10-31 DIAGNOSIS — D696 Thrombocytopenia, unspecified: Secondary | ICD-10-CM | POA: Diagnosis not present

## 2023-10-31 DIAGNOSIS — Z0000 Encounter for general adult medical examination without abnormal findings: Secondary | ICD-10-CM | POA: Diagnosis not present

## 2023-10-31 DIAGNOSIS — Z0001 Encounter for general adult medical examination with abnormal findings: Secondary | ICD-10-CM

## 2023-10-31 DIAGNOSIS — I10 Essential (primary) hypertension: Secondary | ICD-10-CM

## 2023-10-31 DIAGNOSIS — E039 Hypothyroidism, unspecified: Secondary | ICD-10-CM | POA: Diagnosis not present

## 2023-10-31 DIAGNOSIS — C44722 Squamous cell carcinoma of skin of right lower limb, including hip: Secondary | ICD-10-CM | POA: Insufficient documentation

## 2023-10-31 LAB — BASIC METABOLIC PANEL WITH GFR
BUN: 20 mg/dL (ref 6–23)
CO2: 30 meq/L (ref 19–32)
Calcium: 9.3 mg/dL (ref 8.4–10.5)
Chloride: 104 meq/L (ref 96–112)
Creatinine, Ser: 0.86 mg/dL (ref 0.40–1.20)
GFR: 62.78 mL/min (ref >60.00)
Glucose, Bld: 96 mg/dL (ref 70–99)
Potassium: 4.2 meq/L (ref 3.5–5.1)
Sodium: 139 meq/L (ref 135–145)

## 2023-10-31 LAB — CBC WITH DIFFERENTIAL/PLATELET
Basophils Absolute: 0 10*3/uL (ref 0.0–0.1)
Basophils Relative: 0.5 % (ref 0.0–3.0)
Eosinophils Absolute: 0.1 10*3/uL (ref 0.0–0.7)
Eosinophils Relative: 1.4 % (ref 0.0–5.0)
HCT: 43.2 % (ref 36.0–46.0)
Hemoglobin: 14 g/dL (ref 12.0–15.0)
Lymphocytes Relative: 19.6 % (ref 12.0–46.0)
Lymphs Abs: 1.1 10*3/uL (ref 0.7–4.0)
MCHC: 32.5 g/dL (ref 30.0–36.0)
MCV: 90 fl (ref 78.0–100.0)
Monocytes Absolute: 0.4 10*3/uL (ref 0.1–1.0)
Monocytes Relative: 6.1 % (ref 3.0–12.0)
Neutro Abs: 4.3 10*3/uL (ref 1.4–7.7)
Neutrophils Relative %: 72.4 % (ref 43.0–77.0)
Platelets: 150 10*3/uL (ref 150.0–400.0)
RBC: 4.8 Mil/uL (ref 3.87–5.11)
RDW: 14.4 % (ref 11.5–15.5)
WBC: 5.9 10*3/uL (ref 4.0–10.5)

## 2023-10-31 LAB — TSH: TSH: 4.31 u[IU]/mL (ref 0.35–5.50)

## 2023-10-31 MED ORDER — LEVOTHYROXINE SODIUM 25 MCG PO TABS: 25.0000 ug | ORAL_TABLET | Freq: Every day | ORAL | 1 refills | Status: DC

## 2023-10-31 MED ORDER — CYANOCOBALAMIN 1000 MCG/ML IJ SOLN
1000.0000 ug | Freq: Once | INTRAMUSCULAR | Status: AC
  Administered 2023-10-31: 1000 ug via INTRAMUSCULAR

## 2023-10-31 NOTE — Progress Notes (Unsigned)
 Subjective:  Patient ID: [name], female    DOB: 19-Aug-1940  Age: 83 y.o. MRN: [id number]  CC: Annual Exam, Hypertension, and Hypothyroidism   HPI [name] presents for a CPX and f/up ----  Discussed the use of AI scribe software for clinical note transcription with the patient, who gave verbal consent to proceed.  History of Present Illness   [name] is an 83 year old female who presents with jitteriness and nervousness in the morning.  She experiences jitteriness and nervousness upon waking in the morning, which began after a heart attack. Xanax  alleviates these symptoms within 20 minutes to an hour. No chest pain, shortness of breath, dizziness, or lightheadedness since her hospitalization.  She reports chronic abdominal pain localized to the area of her appendix, present for a while. No recent weight loss, but weight fluctuates between 70 to 79 pounds.  She has a history of squamous cell carcinoma with lesions removed from her foot and another area that has recurred. A follow-up appointment is scheduled for early June.  She bruises easily but denies any recent bleeding. Her sleep is satisfactory.       Outpatient Medications Prior to Visit  Medication Sig Dispense Refill   acetaminophen  (TYLENOL ) 500 MG tablet Take 500 mg by mouth every 6 (six) hours as needed for moderate pain (pain score 4-6).     ALPRAZolam  (XANAX ) 0.5 MG tablet Take 1 tablet (0.5 mg total) by mouth 3 (three) times daily as needed. for anxiety 270 tablet 0   aspirin  EC 81 MG tablet Take 1 tablet (81 mg total) by mouth daily. Swallow whole. 30 tablet 12   brimonidine  (ALPHAGAN  P) 0.1 % SOLN Place 1 drop into both eyes 4 (four) times daily.     clopidogrel  (PLAVIX ) 75 MG tablet Take 1 tablet (75 mg total) by mouth daily with breakfast. 90 tablet 3   dicyclomine  (BENTYL ) 10 MG capsule TAKE 1 CAPSULE BY MOUTH 4 TIMES DAILY BEFORE MEAL(S) AND AT BEDTIME 360 capsule 0   escitalopram  (LEXAPRO ) 20  MG tablet Take 1 tablet (20 mg total) by mouth at bedtime. 90 tablet 0   Evolocumab  (REPATHA  SURECLICK) 140 MG/ML SOAJ Inject 140 mg into the skin every 14 (fourteen) days. 6 mL 3   famotidine  (PEPCID ) 20 MG tablet Take 1 tablet by mouth twice daily 30 tablet 0   furosemide  (LASIX ) 20 MG tablet Take 1 tablet (20 mg total) by mouth every morning. 90 tablet 0   nystatin  (MYCOSTATIN /NYSTOP ) powder Apply 1 Application topically 3 (three) times daily. (Patient taking differently: Apply 1 Application topically daily as needed (dry skin).) 15 g 0   potassium chloride  SA (KLOR-CON  M) 20 MEQ tablet Take 1 tablet by mouth twice daily 180 tablet 0   VASCEPA  1 g capsule TAKE 2 CAPSULES BY MOUTH IN THE MORNING AND 2 AT BEDTIME 360 capsule 0   levothyroxine  (SYNTHROID ) 25 MCG tablet Take 1 tablet (25 mcg total) by mouth daily before breakfast. 90 tablet 0   isosorbide  mononitrate (IMDUR ) 30 MG 24 hr tablet Take 1 tablet (30 mg total) by mouth daily. 90 tablet 3   No facility-administered medications prior to visit.    ROS Review of Systems  Constitutional:  Negative for appetite change, chills, diaphoresis, fatigue and fever.  HENT: Negative.    Eyes: Negative.   Respiratory: Negative.  Negative for cough, chest tightness, shortness of breath and wheezing.   Cardiovascular:  Negative for chest pain, palpitations and  leg swelling.  Gastrointestinal:  Positive for abdominal pain. Negative for abdominal distention, constipation, diarrhea and nausea.  Genitourinary: Negative.  Negative for difficulty urinating.  Musculoskeletal:  Negative for arthralgias, back pain, joint swelling and myalgias.  Neurological:  Negative for dizziness, weakness, light-headedness, numbness and headaches.  Hematological:  Negative for adenopathy. Does not bruise/bleed easily.  Psychiatric/Behavioral:  Positive for dysphoric mood. Negative for confusion, decreased concentration, sleep disturbance and suicidal ideas. The patient is  nervous/anxious.     Objective:  BP 122/68 (BP Location: Left Arm, Patient Position: Sitting, Cuff Size: Normal)   Pulse 62   Temp 98.1 F (36.7 C) (Oral)   Resp 16   Ht 5\' 3"  (1.6 m)   Wt 179 lb 9.6 oz (81.5 kg)   SpO2 96%   BMI 31.81 kg/m   BP Readings from Last 3 Encounters:  10/31/23 122/68  08/02/23 124/70  07/05/23 134/80    Wt Readings from Last 3 Encounters:  10/31/23 179 lb 9.6 oz (81.5 kg)  08/02/23 182 lb (82.6 kg)  07/05/23 179 lb 6.4 oz (81.4 kg)    Physical Exam Vitals reviewed.  Constitutional:      Appearance: Normal appearance.  HENT:     Nose: Nose normal.     Mouth/Throat:     Mouth: Mucous membranes are moist.  Eyes:     General: No scleral icterus.    Conjunctiva/sclera: Conjunctivae normal.  Cardiovascular:     Rate and Rhythm: Normal rate and regular rhythm.     Pulses: Normal pulses.     Heart sounds: No murmur heard.    No friction rub. No gallop.  Pulmonary:     Effort: Pulmonary effort is normal.     Breath sounds: No stridor. No wheezing, rhonchi or rales.  Abdominal:     General: Abdomen is flat. Bowel sounds are normal.     Palpations: There is no hepatomegaly, splenomegaly or mass.     Tenderness: There is no abdominal tenderness. There is no guarding.     Hernia: No hernia is present.  Musculoskeletal:        General: Normal range of motion.     Cervical back: Neck supple.     Right lower leg: No edema.     Left lower leg: No edema.  Lymphadenopathy:     Cervical: No cervical adenopathy.  Skin:    General: Skin is warm and dry.     Findings: No rash.  Neurological:     General: No focal deficit present.     Mental Status: She is alert. Mental status is at baseline.  Psychiatric:        Mood and Affect: Mood normal.        Behavior: Behavior normal.     Lab Results  Component Value Date   WBC 5.9 10/31/2023   HGB 14.0 10/31/2023   HCT 43.2 10/31/2023   PLT 150.0 10/31/2023   GLUCOSE 96 10/31/2023   CHOL 173  09/04/2023   TRIG 77 09/04/2023   HDL 49 09/04/2023   LDLDIRECT 174.0 02/27/2019   LDLCALC 109 (H) 09/04/2023   ALT 20 09/04/2023   AST 23 07/05/2023   NA 139 10/31/2023   K 4.2 10/31/2023   CL 104 10/31/2023   CREATININE 0.86 10/31/2023   BUN 20 10/31/2023   CO2 30 10/31/2023   TSH 4.31 10/31/2023   INR 0.98 02/28/2017   HGBA1C 5.6 10/29/2020    DG Bone Density Result Date: 07/18/2023 Table formatting from  the original result was not included. Date of study: 07/18/2023 Exam: DUAL X-RAY ABSORPTIOMETRY (DXA) FOR BONE MINERAL DENSITY (BMD) Instrument: [employer] Requesting Provider: PCP Indication: follow up for low BMD Comparison: 12/16/2009 Clinical data: Pt is a 83 y.o. female without previous history of fracture. On vitamin D . Results:  Lumbar spine L1- L3 (L4) Femoral neck (FN) 33% distal radius T-score -1.0 RFN: -1.6 LFN: -1.6 n/a Change in BMD from previous DXA test (%) -3.7%* -10.7%* n/a (*) statistically significant Assessment: the BMD is low according to the [facility] classification for osteoporosis (see below). Fracture risk: moderate FRAX score: 10 year major osteoporotic risk: 13.5%. 10 year hip fracture risk: 3.6%. The thresholds for treatment are 20% and 3%, respectively. Comments: the technical quality of the study is good. Evaluation for secondary causes should be considered if clinically indicated. Recommend optimizing calcium  (1200 mg/day) and vitamin D  (800 IU/day) intake. Followup: Repeat BMD is appropriate after 2 years or after 1-2 years if starting treatment. WHO criteria for diagnosis of osteoporosis in postmenopausal women and in men 8 y/o or older: - normal: T-score -1.0 to + 1.0 - osteopenia/low bone density: T-score between -2.5 and -1.0 - osteoporosis: T-score below -2.5 - severe osteoporosis: T-score below -2.5 with history of fragility fracture Note: although not part of the WHO classification, the presence of a fragility fracture, regardless of the T-score, should  be considered diagnostic of osteoporosis, provided other causes for the fracture have been excluded. Treatment: The National Osteoporosis Foundation recommends that treatment be considered in postmenopausal women and men age 51 or older with: 1. Hip or vertebral (clinical or morphometric) fracture 2. T-score of - 2.5 or lower at the spine or hip 3. 10-year fracture probability by FRAX of at least 20% for a major osteoporotic fracture and 3% for a hip fracture Emilie Harden, MD [facility]    Assessment & Plan:   Acquired hypothyroidism- She is euthyroid. -     TSH; Future -     Levothyroxine  Sodium; Take 1 tablet (25 mcg total) by mouth daily before breakfast.  Dispense: 90 tablet; Refill: 1  Primary hypertension- BP is well controlled. -     Basic metabolic panel with GFR; Future  Encounter for general adult medical examination with abnormal findings- Exam completed, labs reviewed, vaccines reviewed, no cancer screenings indicated, pt ed material was given.    Thrombocytopenia (HCC)- PLTs are normal. -     CBC with Differential/Platelet; Future  B12 deficiency -     CBC with Differential/Platelet; Future -     Cyanocobalamin      Follow-up: Return in about 6 months (around 05/02/2024).  Sandra Crouch, MD

## 2023-10-31 NOTE — Patient Instructions (Signed)

## 2023-11-01 NOTE — Progress Notes (Deleted)
 Cardiology Office Note:   Date:  11/01/2023  ID:  [name], DOB January 25, 1941, MRN [id number] PCP:  Arcadio Knuckles, MD  [facility] Providers Cardiologist:  Alyssa Backbone, MD Referring MD: Arcadio Knuckles, MD  Chief Complaint/Reason for Referral: Follow-up coronary artery disease ASSESSMENT:    1. ACS (acute coronary syndrome) (HCC)   2. Hyperlipidemia LDL goal <55   3. Aortic atherosclerosis (HCC)   4. Essential hypertension   5. CKD (chronic kidney disease) stage 2, GFR 60-89 ml/min     PLAN:   In order of problems listed above: Acute coronary syndrome: Continue dual antiplatelet therapy with aspirin  and Plavix  until December 2025 changed to Plavix  monotherapy indefinitely.  Continue Imdur  for residual coronary artery disease.  Beta-blocker?*** Hyperlipidemia: Continue Repatha  140 mg every 2 weeks; check lipid panel today.  Goal LDL is less than 55 given history of ACS. Aortic atherosclerosis: Continue aspirin  81 mg and Repatha  140 mg every 2 weeks. Hypertension: Beta-blocker?*** CKD stage II: Consider ARB***        {Are you ordering a CV Procedure (e.g. stress test, cath, DCCV, TEE, etc)?   Press F2        :[id number]}   Dispo:  No follow-ups on file.      Medication Adjustments/Labs and Tests Ordered: Current medicines are reviewed at length with the patient today.  Concerns regarding medicines are outlined above.  The following changes have been made:  {PLAN; NO CHANGE:13088:s}   Labs/tests ordered: No orders of the defined types were placed in this encounter.   Medication Changes: No orders of the defined types were placed in this encounter.   Current medicines are reviewed at length with the patient today.  The patient {ACTIONS; HAS/DOES NOT HAVE:19233} concerns regarding medicines.  I spent *** minutes reviewing all clinical data during and prior to this visit including all relevant imaging studies, laboratories, clinical information from other health  systems and prior notes from both Cardiology and other specialties, interviewing the patient, conducting a complete physical examination, and coordinating care in order to formulate a comprehensive and personalized evaluation and treatment plan.   History of Present Illness:    FOCUSED PROBLEM LIST:   Coronary artery disease Remote cath 1996 with nonobstructive CAD Low risk lexiscan  myoview  01/18/2022 S/p NSTEMI 06/05/23 >>LHC 06/06/23 3 vessel CAD Diffuse 50% narrowing prox to mid LAD 1st diagonal is occluded High grade stenosis first OM Successful PCI with 2.25 x 20 mm Synergy DES RCA small vessel with 2 branches 1 branch with 99% stenosis 2nd branch 85% at bifurcation Normal LVEF TIA Hypertension Fatty liver disease Hyperlipidemia Statin intolerance Allergic to ezetimibe  Aortic atherosclerosis CT abdomen pelvis 2022 BMI 31 CKD stage II GERD Chronic hypokalemia Generalized anxiety disorder Hypothyroidism  May 2025:  Patient consents to use of AI scribe.*** The patient returns for routine follow-up.  She was last seen in January of this year.  At that point in time she was doing well without chest pain or dyspnea.  Her blood pressure was well-controlled.  She was referred to cardiac rehabilitation.  Additionally she was referred to pharmacy she was ultimately started on Repatha .      Current Medications: No outpatient medications have been marked as taking for the 11/06/23 encounter (Appointment) with Estee Yohe K, MD.     Review of Systems:   Please see the history of present illness.    All other systems reviewed and are negative.     EKGs/Labs/Other Test Reviewed:   EKG:  January 2025 sinus rhythm with LVH  EKG Interpretation Date/Time:    Ventricular Rate:    PR Interval:    QRS Duration:    QT Interval:    QTC Calculation:   R Axis:      Text Interpretation:           Risk Assessment/Calculations:   {Does this patient have ATRIAL  FIBRILLATION?:[phone]}      Physical Exam:   VS:  There were no vitals taken for this visit.       Wt Readings from Last 3 Encounters:  10/31/23 179 lb 9.6 oz (81.5 kg)  08/02/23 182 lb (82.6 kg)  07/05/23 179 lb 6.4 oz (81.4 kg)      GENERAL:  No apparent distress, AOx3 HEENT:  No carotid bruits, +2 carotid impulses, no scleral icterus CAR: RRR Irregular RR*** no murmurs***, gallops, rubs, or thrills RES:  Clear to auscultation bilaterally ABD:  Soft, nontender, nondistended, positive bowel sounds x 4 VASC:  +2 radial pulses, +2 carotid pulses NEURO:  CN 2-12 grossly intact; motor and sensory grossly intact PSYCH:  No active depression or anxiety EXT:  No edema, ecchymosis, or cyanosis  Signed, Kyra Phy, MD  11/01/2023 7:39 PM    [facility] [address] [place], [place], [place]  [address] Phone: [phone]; Fax: [phone]   Note:  This document was prepared using Dragon voice recognition software and may include unintentional dictation errors.

## 2023-11-01 NOTE — Progress Notes (Deleted)
  Cardiology Office Note:   Date:  11/01/2023  ID:  [name], DOB Nov 11, 1940, MRN [id number] PCP:  Arcadio Knuckles, MD  [facility] Providers Cardiologist:  Alyssa Backbone, MD Referring MD: Arcadio Knuckles, MD  Chief Complaint/Reason for Referral:  *** ASSESSMENT:   No diagnosis found.  PLAN:   In order of problems listed above: ***        {Are you ordering a CV Procedure (e.g. stress test, cath, DCCV, TEE, etc)?   Press F2        :[id number]}   Dispo:  No follow-ups on file.      Medication Adjustments/Labs and Tests Ordered: Current medicines are reviewed at length with the patient today.  Concerns regarding medicines are outlined above.  The following changes have been made:  {PLAN; NO CHANGE:13088:s}   Labs/tests ordered: No orders of the defined types were placed in this encounter.   Medication Changes: No orders of the defined types were placed in this encounter.   Current medicines are reviewed at length with the patient today.  The patient {ACTIONS; HAS/DOES NOT HAVE:19233} concerns regarding medicines.  I spent *** minutes reviewing all clinical data during and prior to this visit including all relevant imaging studies, laboratories, clinical information from other health systems and prior notes from both Cardiology and other specialties, interviewing the patient, conducting a complete physical examination, and coordinating care in order to formulate a comprehensive and personalized evaluation and treatment plan.   History of Present Illness:       FOCUSED PROBLEM LIST:   Df   {There is no content from the last Narrative History section.}           Current Medications: No outpatient medications have been marked as taking for the 11/06/23 encounter (Appointment) with Carey Lafon K, MD.     Review of Systems:   Please see the history of present illness.    All other systems reviewed and are negative.     EKGs/Labs/Other Test Reviewed:    EKG:    EKG Interpretation Date/Time:    Ventricular Rate:    PR Interval:    QRS Duration:    QT Interval:    QTC Calculation:   R Axis:      Text Interpretation:           Risk Assessment/Calculations:   {Does this patient have ATRIAL FIBRILLATION?[phone]}      Physical Exam:   VS:  There were no vitals taken for this visit.       Wt Readings from Last 3 Encounters:  10/31/23 179 lb 9.6 oz (81.5 kg)  08/02/23 182 lb (82.6 kg)  07/05/23 179 lb 6.4 oz (81.4 kg)      GENERAL:  No apparent distress, AOx3 HEENT:  No carotid bruits, +2 carotid impulses, no scleral icterus CAR: RRR Irregular RR*** no murmurs***, gallops, rubs, or thrills RES:  Clear to auscultation bilaterally ABD:  Soft, nontender, nondistended, positive bowel sounds x 4 VASC:  +2 radial pulses, +2 carotid pulses NEURO:  CN 2-12 grossly intact; motor and sensory grossly intact PSYCH:  No active depression or anxiety EXT:  No edema, ecchymosis, or cyanosis  Signed, Delma Drone K Louay Myrie, MD  11/01/2023 7:22 PM    [facility] [address] [place], [place], [place]  [address] Phone: [phone]; Fax: [phone]   Note:  This document was prepared using Dragon voice recognition software and may include unintentional dictation errors.

## 2023-11-06 ENCOUNTER — Ambulatory Visit: Payer: PPO | Admitting: Internal Medicine

## 2023-11-06 DIAGNOSIS — I249 Acute ischemic heart disease, unspecified: Secondary | ICD-10-CM

## 2023-11-06 DIAGNOSIS — E785 Hyperlipidemia, unspecified: Secondary | ICD-10-CM

## 2023-11-06 DIAGNOSIS — N182 Chronic kidney disease, stage 2 (mild): Secondary | ICD-10-CM

## 2023-11-06 DIAGNOSIS — I10 Essential (primary) hypertension: Secondary | ICD-10-CM

## 2023-11-06 DIAGNOSIS — I700 Atherosclerosis of aorta: Secondary | ICD-10-CM

## 2023-11-14 DIAGNOSIS — H401131 Primary open-angle glaucoma, bilateral, mild stage: Secondary | ICD-10-CM | POA: Diagnosis not present

## 2023-11-14 DIAGNOSIS — Z961 Presence of intraocular lens: Secondary | ICD-10-CM | POA: Diagnosis not present

## 2023-11-14 DIAGNOSIS — H04123 Dry eye syndrome of bilateral lacrimal glands: Secondary | ICD-10-CM | POA: Diagnosis not present

## 2023-11-14 NOTE — Progress Notes (Deleted)
  [address], [address] [place], [place] [address] [phone]  Audiological Evaluation    Name: DAMESHIA SEYBOLD     DOB:   05/14/1941      MRN:   [id number]                                                                                     Service Date: 11/14/2023     Accompanied by: ***   Patient comes today after Dr. Darlin Ehrlich, ENT sent a referral for a hearing evaluation due to concerns with hearing loss.   Symptoms Yes Details  Hearing loss  [x]  Previous audiograms completed at Dr. [facility].***  Tinnitus  []    Ear pain/ infections/pressure  []    Balance problems  []    Noise exposure history  []    Previous ear surgeries  []    Family history of hearing loss  []    Amplification  []    Other  []      Otoscopy: Right ear: {otoscopy:31227} Left ear:  {otoscopy:31227}  Tympanometry: Right ear: {tympanometry results:31367}. Left ear: {tympanometry results:31367}.  Pure tone Audiometry: Right ear- *** {hearing loss types:31372::"sensorineural hearing loss"} from *** Hz - *** Hz. Left ear-  *** {hearing loss types:31372::"sensorineural hearing loss"} from *** Hz - *** Hz.  Speech Audiometry: Right ear- Speech Reception Threshold (SRT) was obtained at *** dBHL. Left ear-Speech Reception Threshold (SRT) was obtained at *** dBHL.   Word Recognition Score Tested using NU-6 (MLV) Right ear: ***% was obtained at a presentation level of *** dBHL with contralateral masking which is deemed as  {word recognition score:31373}. Left ear: ***% was obtained at a presentation level of *** dBHL with contralateral masking which is deemed as  {word recognition score:31373}.   The hearing test results were completed {transducer options:31388} and results are deemed to be of {test reliability:31390::"good reliability"}. Test technique:  {audiometric test technique:31400::"conventional"}    Impression: {Word recognition Score interpretation:31432::"There is not a significant difference in  pure-tone thresholds between ears.","There is not a significant difference in the word recognition score in between ears. "}   Recommendations: {Audiology Recommendations:31370::"Follow up with ENT as scheduled for today."}   Dayana Dalporto MARIE LEROUX-MARTINEZ, AUD

## 2023-11-17 ENCOUNTER — Ambulatory Visit (INDEPENDENT_AMBULATORY_CARE_PROVIDER_SITE_OTHER): Admitting: Audiology

## 2023-11-17 ENCOUNTER — Encounter (INDEPENDENT_AMBULATORY_CARE_PROVIDER_SITE_OTHER): Payer: Self-pay | Admitting: Otolaryngology

## 2023-11-17 ENCOUNTER — Ambulatory Visit (INDEPENDENT_AMBULATORY_CARE_PROVIDER_SITE_OTHER): Admitting: Otolaryngology

## 2023-11-17 VITALS — BP 127/75 | HR 65 | Ht 63.0 in | Wt 175.0 lb

## 2023-11-17 DIAGNOSIS — J310 Chronic rhinitis: Secondary | ICD-10-CM | POA: Diagnosis not present

## 2023-11-17 DIAGNOSIS — H903 Sensorineural hearing loss, bilateral: Secondary | ICD-10-CM

## 2023-11-17 DIAGNOSIS — H6983 Other specified disorders of Eustachian tube, bilateral: Secondary | ICD-10-CM

## 2023-11-17 DIAGNOSIS — R0981 Nasal congestion: Secondary | ICD-10-CM

## 2023-11-17 DIAGNOSIS — J343 Hypertrophy of nasal turbinates: Secondary | ICD-10-CM

## 2023-11-17 DIAGNOSIS — H6121 Impacted cerumen, right ear: Secondary | ICD-10-CM | POA: Diagnosis not present

## 2023-11-19 DIAGNOSIS — H6121 Impacted cerumen, right ear: Secondary | ICD-10-CM | POA: Insufficient documentation

## 2023-11-19 DIAGNOSIS — J343 Hypertrophy of nasal turbinates: Secondary | ICD-10-CM | POA: Insufficient documentation

## 2023-11-19 DIAGNOSIS — J310 Chronic rhinitis: Secondary | ICD-10-CM | POA: Insufficient documentation

## 2023-11-19 NOTE — Progress Notes (Signed)
 Patient ID: [name], female   DOB: [birth year], 83 y.o.   MRN: [id number]  Follow-up: Hearing loss, bilateral eustachian tube dysfunction.  HPI: The patient is an 83 year old female who returns today for follow-up evaluation.  She was last seen in October 2024.  She has a history of bilateral eustachian tube dysfunction and bilateral hearing loss.  She underwent bilateral myringotomy and tube placement twice by Dr. Sean Czar.  The tubes have since extruded.  At her last visit, both tympanic membranes were noted to be intact.  She was treated with Valsalva exercise.  The patient returns today reporting no significant otologic difficulty.  She denies any change in her hearing.  She is still having occasional nasal congestion.  She cannot use Flonase  secondary to her glaucoma.  Exam: General: Communicates without difficulty, well nourished, no acute distress. Head: Normocephalic, no evidence injury, no tenderness, facial buttresses intact without stepoff. Face/sinus: No tenderness to palpation and percussion. Facial movement is normal and symmetric. Eyes: PERRL, EOMI. No scleral icterus, conjunctivae clear. Neuro: CN II exam reveals vision grossly intact.  No nystagmus at any point of gaze. EAC: Right ear cerumen accumulation.  Nose: External evaluation reveals normal support and skin without lesions.  Dorsum is intact.  Anterior rhinoscopy reveals congested mucosa over anterior aspect of inferior turbinates and intact septum.  No purulence noted. Oral:  Oral cavity and oropharynx are intact, symmetric, without erythema or edema.  Mucosa is moist without lesions. Neck: Full range of motion without pain.  There is no significant lymphadenopathy.  No masses palpable.  Thyroid  bed within normal limits to palpation.  Parotid glands and submandibular glands equal bilaterally without mass.  Trachea is midline. Neuro:  CN 2-12 grossly intact.    Procedure: Right ear cerumen removal Anesthesia:  None Description: Under the operating microscope, the cerumen is carefully removed with a combination of cerumen currette, alligator forceps, and suction catheters.  After the cerumen is removed, the TMs are noted to be intact, with bilateral soft tissue scarring.  No mass, erythema, or lesions. The patient tolerated the procedure well.    Assessment: 1.  Incidental finding of right ear cerumen impaction.  After the disimpaction procedure, both tympanic membranes and middle ear spaces are noted to be normal.  Bilateral TM scarring is noted. 2.  Bilateral eustachian tube dysfunction. 3.  Chronic rhinitis with nasal mucosal congestion and bilateral inferior turbinate hypertrophy.  Plan: 1.  Otomicroscopy with right ear cerumen disimpaction. 2.  The physical exam findings are reviewed with the patient. 3.  Antihistamine and nasal saline irrigation to treat her chronic rhinitis.  The patient cannot use steroid nasal spray due to her glaucoma. 4.  Valsalva exercise as needed. 5.  The patient will return for reevaluation in 1 year.

## 2023-12-01 ENCOUNTER — Ambulatory Visit (INDEPENDENT_AMBULATORY_CARE_PROVIDER_SITE_OTHER)

## 2023-12-01 ENCOUNTER — Other Ambulatory Visit: Payer: Self-pay | Admitting: Internal Medicine

## 2023-12-01 DIAGNOSIS — K582 Mixed irritable bowel syndrome: Secondary | ICD-10-CM

## 2023-12-01 DIAGNOSIS — E538 Deficiency of other specified B group vitamins: Secondary | ICD-10-CM | POA: Diagnosis not present

## 2023-12-01 MED ORDER — CYANOCOBALAMIN 1000 MCG/ML IJ SOLN
1000.0000 ug | Freq: Once | INTRAMUSCULAR | Status: AC
  Administered 2023-12-01: 1000 ug via INTRAMUSCULAR

## 2023-12-01 NOTE — Progress Notes (Signed)
 Patient visits today for their b-12 injection. Patient informed of what they had received and tolerated injection well. Patient notified to reach out to office if needed.

## 2023-12-04 ENCOUNTER — Encounter: Payer: Self-pay | Admitting: Internal Medicine

## 2023-12-05 ENCOUNTER — Other Ambulatory Visit: Payer: Self-pay

## 2023-12-05 DIAGNOSIS — I10 Essential (primary) hypertension: Secondary | ICD-10-CM

## 2023-12-05 MED ORDER — POTASSIUM CHLORIDE CRYS ER 20 MEQ PO TBCR: EXTENDED_RELEASE_TABLET | ORAL | 0 refills | Status: DC

## 2023-12-19 ENCOUNTER — Other Ambulatory Visit: Payer: Self-pay | Admitting: Internal Medicine

## 2023-12-19 DIAGNOSIS — F411 Generalized anxiety disorder: Secondary | ICD-10-CM

## 2023-12-26 DIAGNOSIS — H401131 Primary open-angle glaucoma, bilateral, mild stage: Secondary | ICD-10-CM | POA: Diagnosis not present

## 2023-12-26 DIAGNOSIS — Z961 Presence of intraocular lens: Secondary | ICD-10-CM | POA: Diagnosis not present

## 2023-12-29 ENCOUNTER — Telehealth: Payer: Self-pay | Admitting: *Deleted

## 2023-12-29 ENCOUNTER — Ambulatory Visit (INDEPENDENT_AMBULATORY_CARE_PROVIDER_SITE_OTHER)

## 2023-12-29 DIAGNOSIS — Z789 Other specified health status: Secondary | ICD-10-CM

## 2023-12-29 DIAGNOSIS — I2510 Atherosclerotic heart disease of native coronary artery without angina pectoris: Secondary | ICD-10-CM

## 2023-12-29 DIAGNOSIS — E538 Deficiency of other specified B group vitamins: Secondary | ICD-10-CM | POA: Diagnosis not present

## 2023-12-29 DIAGNOSIS — E785 Hyperlipidemia, unspecified: Secondary | ICD-10-CM

## 2023-12-29 MED ORDER — CYANOCOBALAMIN 1000 MCG/ML IJ SOLN
1000.0000 ug | Freq: Once | INTRAMUSCULAR | Status: AC
  Administered 2023-12-29: 1000 ug via INTRAMUSCULAR

## 2023-12-29 NOTE — Progress Notes (Signed)
Patient here for monthly B12 injection per Dr. Jones.  B12 1000 mcg given in left IM and patient tolerated injection well today.  

## 2023-12-29 NOTE — Telephone Encounter (Signed)
 Lab is calling because the patient is there to have labwork done - information is not in [facility] system.    RN reviewed patient's [facility] chart.   Patient received a phone call in 09/05/23 by  Pharmacist- M. Maccia  RPH. Patient was informed to have labs completed prior to her appointment  with  Dr Wendel or APP.   Patient has an appointment on 01/08/24 with [employer].  Order has been placed for Lipid , hepatic,-   Patient states she has been taking Repatha .

## 2023-12-30 LAB — LIPID PANEL
Chol/HDL Ratio: 4.1 ratio (ref 0.0–4.4)
Cholesterol, Total: 162 mg/dL (ref 100–199)
HDL: 40 mg/dL (ref >39)
LDL Chol Calc (NIH): 100 mg/dL — ABNORMAL HIGH (ref 0–99)
Triglycerides: 121 mg/dL (ref 0–149)
VLDL Cholesterol Cal: 22 mg/dL (ref 5–40)

## 2023-12-30 LAB — HEPATIC FUNCTION PANEL
ALT: 20 IU/L (ref 0–32)
AST: 28 IU/L (ref 0–40)
Albumin: 4.1 g/dL (ref 3.7–4.7)
Alkaline Phosphatase: 125 IU/L — ABNORMAL HIGH (ref 44–121)
Bilirubin Total: 0.6 mg/dL (ref 0.0–1.2)
Bilirubin, Direct: 0.21 mg/dL (ref 0.00–0.40)
Total Protein: 6.7 g/dL (ref 6.0–8.5)

## 2024-01-06 NOTE — Progress Notes (Unsigned)
 Cardiology Office Note   Date:  01/08/2024  ID:  [name], DOB August 20, 1940, MRN [id number] PCP: Joshua Debby CROME, MD  [facility] Providers Cardiologist:  Lurena MARLA Red, MD {  History of Present Illness [name] is a 83 y.o. female with a PMH of CAD without angina, HTN, HLD, stomach pain, statin intolerance.   She was last seen January 2025.  She reported feeling well since hospital discharge no further episodes of chest pain.  Persistent stomach discomfort that is constant.  Present since second hospital visit where her medications were adjusted.  Patient was on Protonix  for acid reflux prevention although she reports never having experienced heartburn.  Lengthy history of high cholesterol with statin intolerance.  Has been on Vascepa  for about 3 years.  However, can only tolerate 1 g twice daily rather than 2 which is recommended.  Had 3 falls prior to MI wonders if this is related.  1 fall was witnessed and was clearly a mechanical fall with tripping over a trash bag.  The other 2 were not witnessed.  She denied any dizziness or unusual feelings in her head before the falls.  She has resumed regular activities of caring daily for her great granddaughter.  Not having any shortness of breath, palpitations, orthopnea, PND, edema, presyncope, or syncope.  Today, she presents with coronary artery disease and hyperlipidemia for follow-up on her lipid management and medication review.  Her medications include Vascepa , Plavix , aspirin , Lasix  20 mg every morning with potassium, and Repatha  injections biweekly. She experiences no side effects from these medications. Her LDL was 100 on July 11, with no significant reduction since December when it was 108. Triglycerides are at 121. She has tried several statins, including Zocor , which caused joint pain, but experiences no significant joint pain with Repatha .  She experiences occasional palpitations and shortness of breath, particularly  during exertion such as grocery shopping. These symptoms remain stable and have not worsened.  No edema, orthopnea, PND.   Discussed the use of AI scribe software for clinical note transcription with the patient, who gave verbal consent to proceed.   ROS: Pertinent ROS in HPI  Studies Reviewed      IMPRESSIONS     1. Left ventricular ejection fraction, by estimation, is 55 to 60%. The  left ventricle has normal function. The left ventricle has no regional  wall motion abnormalities.   2. Right ventricular systolic function is normal. The right ventricular  size is normal.   3. The mitral valve is normal in structure. Trivial mitral valve  regurgitation.   4. The aortic valve is grossly normal. There is mild calcification of the  aortic valve. Aortic valve regurgitation is trivial.   5. No pericardial effusion.   FINDINGS   Left Ventricle: Left ventricular ejection fraction, by estimation, is 55  to 60%. The left ventricle has normal function. The left ventricle has no  regional wall motion abnormalities. The left ventricular internal cavity  size was normal in size. There is   no left ventricular hypertrophy.   Right Ventricle: The right ventricular size is normal. No increase in  right ventricular wall thickness. Right ventricular systolic function is  normal.   Left Atrium: Left atrial size was normal in size.   Right Atrium: Right atrial size was normal in size.   Pericardium: There is no evidence of pericardial effusion.   Mitral Valve: The mitral valve is normal in structure. Trivial mitral  valve regurgitation.  Aortic Valve: The aortic valve is grossly normal. There is mild  calcification of the aortic valve. Aortic valve regurgitation is trivial.  Aortic regurgitation PHT measures 836 msec.   Aorta: The aortic root is normal in size and structure.   Physical Exam VS:  BP 134/70   Pulse 70   Ht 5' 3 (1.6 m)   Wt 182 lb 9.6 oz (82.8 kg)   SpO2 95%    BMI 32.35 kg/m        Wt Readings from Last 3 Encounters:  01/08/24 182 lb 9.6 oz (82.8 kg)  11/17/23 175 lb (79.4 kg)  10/31/23 179 lb 9.6 oz (81.5 kg)    GEN: Well nourished, well developed in no acute distress NECK: No JVD; No carotid bruits CARDIAC: RRR, no murmurs, rubs, gallops RESPIRATORY:  Clear to auscultation without rales, wheezing or rhonchi  ABDOMEN: Soft, non-tender, non-distended EXTREMITIES:  No edema; No deformity   ASSESSMENT AND PLAN  Coronary artery disease Coronary artery disease with LDL goal under 70 mg/dL. Current LDL is 100 mg/dL. Blood pressure controlled. No recent chest pain or myocardial infarction. Echocardiogram showed normal cardiac function, trivial mitral regurgitation, mild aortic calcification.  Hyperlipidemia LDL remains elevated at 100 mg/dL despite Repatha . Statin intolerance due to arthralgia. Repatha  expected to reduce LDL by 50% over several months. - Refer to lipid clinic with Rosaline Bane, NP for further management. - Consider adding ezetimibe  if further LDL reduction is needed. - Monitor liver function tests with lipid panels.  Palpitations Occasional short-lived palpitations with no significant change. Echocardiogram showed normal cardiac function.  Stomach pain Intermittent stomach pain managed with Pepcid  20 mg twice daily. - Consider gastroenterology referral if pain persists for further evaluation, including potential endoscopy.  Wound care Multiple cuts and bruises from breaking a car window. Wounds not infected but require monitoring. - Advise cleaning wounds with Hibiclens and keeping them dry. - Change bandages at least twice a day. - Monitor for signs of infection such as increased erythema or drainage. - Continue using triple antibiotic ointment.     Dispo: She can follow-up in 6 months with Dr. Wendel  Signed, Orren LOISE Fabry, PA-C

## 2024-01-07 ENCOUNTER — Other Ambulatory Visit: Payer: Self-pay | Admitting: Family Medicine

## 2024-01-07 DIAGNOSIS — K2100 Gastro-esophageal reflux disease with esophagitis, without bleeding: Secondary | ICD-10-CM

## 2024-01-08 ENCOUNTER — Ambulatory Visit: Admitting: Physician Assistant

## 2024-01-08 ENCOUNTER — Ambulatory Visit: Attending: Physician Assistant | Admitting: Physician Assistant

## 2024-01-08 ENCOUNTER — Encounter: Payer: Self-pay | Admitting: Physician Assistant

## 2024-01-08 VITALS — BP 134/70 | HR 70 | Ht 63.0 in | Wt 182.6 lb

## 2024-01-08 DIAGNOSIS — I2510 Atherosclerotic heart disease of native coronary artery without angina pectoris: Secondary | ICD-10-CM

## 2024-01-08 DIAGNOSIS — E781 Pure hyperglyceridemia: Secondary | ICD-10-CM | POA: Diagnosis not present

## 2024-01-08 DIAGNOSIS — E785 Hyperlipidemia, unspecified: Secondary | ICD-10-CM | POA: Diagnosis not present

## 2024-01-08 DIAGNOSIS — R109 Unspecified abdominal pain: Secondary | ICD-10-CM

## 2024-01-08 DIAGNOSIS — I10 Essential (primary) hypertension: Secondary | ICD-10-CM

## 2024-01-08 DIAGNOSIS — Z789 Other specified health status: Secondary | ICD-10-CM | POA: Diagnosis not present

## 2024-01-08 MED ORDER — POTASSIUM CHLORIDE CRYS ER 20 MEQ PO TBCR: EXTENDED_RELEASE_TABLET | ORAL | 3 refills | Status: AC

## 2024-01-08 MED ORDER — FUROSEMIDE 20 MG PO TABS
20.0000 mg | ORAL_TABLET | Freq: Every morning | ORAL | 3 refills | Status: AC
Start: 2024-01-08 — End: ?

## 2024-01-08 MED ORDER — CLOPIDOGREL BISULFATE 75 MG PO TABS: 75.0000 mg | ORAL_TABLET | Freq: Every day | ORAL | 3 refills | Status: AC

## 2024-01-08 MED ORDER — REPATHA SURECLICK 140 MG/ML ~~LOC~~ SOAJ: 140.0000 mg | SUBCUTANEOUS | 3 refills | Status: AC

## 2024-01-08 MED ORDER — ISOSORBIDE MONONITRATE ER 30 MG PO TB24: 30.0000 mg | ORAL_TABLET | Freq: Every day | ORAL | 3 refills | Status: AC

## 2024-01-08 MED ORDER — VASCEPA 1 G PO CAPS: ORAL_CAPSULE | ORAL | 3 refills | Status: AC

## 2024-01-08 NOTE — Patient Instructions (Addendum)
 Medication Instructions:  NO CHANGES *If you need a refill on your cardiac medications before your next appointment, please call your pharmacy*  Lab Work: NO LABS If you have labs (blood work) drawn today and your tests are completely normal, you will receive your results only by: MyChart Message (if you have MyChart) OR A paper copy in the mail If you have any lab test that is abnormal or we need to change your treatment, we will call you to review the results.  Testing/Procedures: NO TESTING  Follow-Up: At [facility], you and your health needs are our priority.  As part of our continuing mission to provide you with exceptional heart care, our providers are all part of one team.  This team includes your primary Cardiologist (physician) and Advanced Practice Providers or APPs (Physician Assistants and Nurse Practitioners) who all work together to provide you with the care you need, when you need it.  Your next appointment:   6 month(s)  Provider:   Arun K Thukkani, MD   Other Instructions You have been referred to LIPID CLINIC with Rosaline Bane, NP

## 2024-01-29 ENCOUNTER — Ambulatory Visit (INDEPENDENT_AMBULATORY_CARE_PROVIDER_SITE_OTHER)

## 2024-01-29 DIAGNOSIS — E538 Deficiency of other specified B group vitamins: Secondary | ICD-10-CM | POA: Diagnosis not present

## 2024-01-29 MED ORDER — CYANOCOBALAMIN 1000 MCG/ML IJ SOLN
1000.0000 ug | Freq: Once | INTRAMUSCULAR | Status: AC
  Administered 2024-01-29 (×2): 1000 ug via INTRAMUSCULAR

## 2024-01-29 NOTE — Progress Notes (Signed)
 After obtaining consent, and per orders of Dr. Joshua, injection of B12 given by Ronnald SHAUNNA Palms. Patient instructed to report any adverse reaction to me immediately.

## 2024-02-15 ENCOUNTER — Institutional Professional Consult (permissible substitution) (HOSPITAL_BASED_OUTPATIENT_CLINIC_OR_DEPARTMENT_OTHER): Admitting: Internal Medicine

## 2024-03-01 ENCOUNTER — Other Ambulatory Visit: Payer: Self-pay | Admitting: Internal Medicine

## 2024-03-01 ENCOUNTER — Encounter: Payer: Self-pay | Admitting: Pulmonary Disease

## 2024-03-01 ENCOUNTER — Ambulatory Visit (INDEPENDENT_AMBULATORY_CARE_PROVIDER_SITE_OTHER): Admitting: Pulmonary Disease

## 2024-03-01 VITALS — BP 122/62 | HR 61 | Ht 63.0 in | Wt 181.2 lb

## 2024-03-01 DIAGNOSIS — Z8709 Personal history of other diseases of the respiratory system: Secondary | ICD-10-CM

## 2024-03-01 DIAGNOSIS — J4531 Mild persistent asthma with (acute) exacerbation: Secondary | ICD-10-CM

## 2024-03-01 DIAGNOSIS — K582 Mixed irritable bowel syndrome: Secondary | ICD-10-CM

## 2024-03-01 DIAGNOSIS — R053 Chronic cough: Secondary | ICD-10-CM

## 2024-03-01 DIAGNOSIS — J40 Bronchitis, not specified as acute or chronic: Secondary | ICD-10-CM

## 2024-03-01 MED ORDER — PREDNISONE 20 MG PO TABS: ORAL_TABLET | ORAL | 0 refills | Status: AC

## 2024-03-01 MED ORDER — AZITHROMYCIN 250 MG PO TABS
ORAL_TABLET | ORAL | 0 refills | Status: AC
Start: 2024-03-01 — End: 2024-03-06

## 2024-03-01 NOTE — Progress Notes (Signed)
 @Patient  ID: [name], female    DOB: 02/09/1941, 83 y.o.   MRN: [id number]  Chief Complaint  Patient presents with   Medical Management of Chronic Issues    Pt states Refills     Referring provider: Joshua Debby CROME, MD  HPI:   83 y.o. woman previously seen in pulmonary clinic by Dr. Christi whom we are seeing for evaluation of chronic cough.  Multiple ENT notes reviewed.  Seen in routine follow-up.  Overdue for follow-up.  Recurrent bronchitis prompted evaluation.  At time of my evaluation that had all gotten better improved.  I prescribed Z-Pak and prednisone  to have on hand in case that occurs in the meantime.  She did use this once in the spring.  Otherwise doing well.  Not nearly as frequent as the preceding year.  Not on any inhalers.  We discussed at length role and rationale for inhalers if needing multiple courses of antibiotics and steroids a year over the course of months.  Currently not at that point.  Cough not a major issue day-to-day currently.  HPI initial visit: Today doing well.  Cough is resolved.  Started with what sounds like viral illness and ear pain, ear congestion 04/2022.  Recurrent bouts of productive cough green phlegm lasting through April 2024.  Several rounds of antibiotics and steroids.  Would get better for short period of time but then would recur.  Uses albuterol  as needed.  Helps a little bit.  It makes her jittery does not like the way it feels.  Not on maintenance inhaler.  Main concern was chest congestion and cough.  Had some sinusitis issues as well but consistently endorses congestion in her chest and coughing up phlegm.  She describes bouts of recurrent bronchitis throughout her life.  Coughing up green phlegm, chest congestion.  Usually better with a Z-Pak and a prednisone  course.  Has been on maintenance inhalers in the past cannot wear how much it helped.  Most recent chest imaging in the midst of chronic cough symptoms are described above  05/2022 reviewed and interpreted as clear lungs bilaterally.    Questionaires / Pulmonary Flowsheets:   ACT:      No data to display          MMRC:     No data to display          Epworth:      No data to display          Tests:   FENO:  No results found for: NITRICOXIDE  PFT:     No data to display          WALK:      No data to display          Imaging: Personally reviewed and as per EMR and discussion in this note No results found.  Lab Results: Personally reviewed and as per EMR  CBC    Component Value Date/Time   WBC 5.9 10/31/2023 1052   RBC 4.80 10/31/2023 1052   HGB 14.0 10/31/2023 1052   HCT 43.2 10/31/2023 1052   PLT 150.0 10/31/2023 1052   MCV 90.0 10/31/2023 1052   MCH 29.6 06/20/2023 0022   MCHC 32.5 10/31/2023 1052   RDW 14.4 10/31/2023 1052   LYMPHSABS 1.1 10/31/2023 1052   MONOABS 0.4 10/31/2023 1052   EOSABS 0.1 10/31/2023 1052   BASOSABS 0.0 10/31/2023 1052    BMET    Component Value Date/Time   NA 139 10/31/2023  1052   K 4.2 10/31/2023 1052   CL 104 10/31/2023 1052   CO2 30 10/31/2023 1052   GLUCOSE 96 10/31/2023 1052   BUN 20 10/31/2023 1052   CREATININE 0.86 10/31/2023 1052   CALCIUM  9.3 10/31/2023 1052   GFRNONAA 49 (L) 06/20/2023 0022   GFRAA >60 07/06/2017 0958    BNP    Component Value Date/Time   BNP 235.5 (H) 06/20/2023 0415    ProBNP    Component Value Date/Time   PROBNP 244.0 (H) 07/05/2023 1112    Specialty Problems       Pulmonary Problems   Allergic rhinitis due to pollen   Simple chronic bronchitis (HCC)   Loud snoring   Mild persistent asthma with exacerbation   Chronic rhinitis   Hypertrophy of nasal turbinates    Allergies  Allergen Reactions   Diltiazem Hcl Other (See Comments)    REACTION: pt states it made her dizzy...   Statins     Muscle aches   Zetia  [Ezetimibe ]     it made me feel bad   Codeine Nausea Only   Sulfonamide Derivatives Hives, Itching  and Rash   Tape Itching   Protonix  [Pantoprazole ] Nausea Only   Tramadol  Itching    Immunization History  Administered Date(s) Administered   Fluad Quad(high Dose 65+) 02/27/2019, 02/25/2022   Fluad Trivalent(High Dose 65+) 03/10/2023   INFLUENZA, HIGH DOSE SEASONAL PF 03/09/2017   Influenza Split 03/27/2011, 03/30/2012   Influenza Whole 04/06/2009, 04/12/2010   Influenza,inj,Quad PF,6+ Mos 04/18/2013, 02/19/2014, 03/31/2015, 03/04/2016   Influenza-Unspecified 02/18/2018, 04/09/2020, 04/08/2021   Moderna Sars-Covid-2 Vaccination 07/03/2019, 07/31/2019, 04/10/2020   Pneumococcal Conjugate-13 03/18/2016   Pneumococcal Polysaccharide-23 03/21/2007, 08/03/2022   Tdap 10/17/2011, 10/04/2017   Zoster Recombinant(Shingrix) 12/16/2019, 03/18/2020    Past Medical History:  Diagnosis Date   Anxiety state, unspecified    CAD (coronary artery disease)    Disorder of bone and cartilage, unspecified    Diverticulosis of colon (without mention of hemorrhage)    Dizziness and giddiness    Fatty liver    Fibromyalgia    Obesity, unspecified    Other and unspecified hyperlipidemia    Other chronic nonalcoholic liver disease    Skin cancer    basal cell on face   Stroke (HCC)    TIA   TIA (transient ischemic attack)    Unspecified essential hypertension     Tobacco History: Social History   Tobacco Use  Smoking Status Never  Smokeless Tobacco Never   Counseling given: Not Answered   Continue to not smoke  Outpatient Encounter Medications as of 03/01/2024  Medication Sig   azithromycin  (ZITHROMAX ) 250 MG tablet Take 2 tablets (500 mg total) by mouth daily for 1 day, THEN 1 tablet (250 mg total) daily for 4 days.   predniSONE  (DELTASONE ) 20 MG tablet Take 2 tablets (40 mg total) by mouth daily with breakfast for 5 days, THEN 1 tablet (20 mg total) daily with breakfast for 5 days.   acetaminophen  (TYLENOL ) 500 MG tablet Take 500 mg by mouth every 6 (six) hours as needed for  moderate pain (pain score 4-6).   ALPRAZolam  (XANAX ) 0.5 MG tablet Take 1 tablet (0.5 mg total) by mouth 3 (three) times daily as needed. for anxiety   aspirin  EC 81 MG tablet Take 1 tablet (81 mg total) by mouth daily. Swallow whole.   brimonidine  (ALPHAGAN  P) 0.1 % SOLN Place 1 drop into both eyes 4 (four) times daily.   clopidogrel  (PLAVIX ) 75  MG tablet Take 1 tablet (75 mg total) by mouth daily with breakfast.   dicyclomine  (BENTYL ) 10 MG capsule TAKE 1 CAPSULE BY MOUTH 4 TIMES DAILY BEFORE MEAL(S) AND AT BEDTIME   dorzolamide  (TRUSOPT ) 2 % ophthalmic solution Place 1 drop into both eyes 2 (two) times daily.   escitalopram  (LEXAPRO ) 20 MG tablet TAKE 1 TABLET BY MOUTH AT BEDTIME   Evolocumab  (REPATHA  SURECLICK) 140 MG/ML SOAJ Inject 140 mg into the skin every 14 (fourteen) days.   famotidine  (PEPCID ) 20 MG tablet Take 1 tablet by mouth twice daily   furosemide  (LASIX ) 20 MG tablet Take 1 tablet (20 mg total) by mouth every morning.   isosorbide  mononitrate (IMDUR ) 30 MG 24 hr tablet Take 1 tablet (30 mg total) by mouth daily.   levothyroxine  (SYNTHROID ) 25 MCG tablet Take 1 tablet (25 mcg total) by mouth daily before breakfast.   nystatin  (MYCOSTATIN /NYSTOP ) powder Apply 1 Application topically 3 (three) times daily. (Patient taking differently: Apply 1 Application topically daily as needed (dry skin).)   potassium chloride  SA (KLOR-CON  M) 20 MEQ tablet Take 1 tablet by mouth twice daily   SIMBRINZA 1-0.2 % SUSP Apply 1 drop to eye 2 (two) times daily.   VASCEPA  1 g capsule TAKE 2 CAPSULES BY MOUTH IN THE MORNING AND 2 AT BEDTIME   No facility-administered encounter medications on file as of 03/01/2024.     Review of Systems  Review of Systems  No chest pain with exertion.  No orthopnea or PND.  Comprehensive review of systems otherwise negative. Physical Exam  BP 122/62   Pulse 61   Ht 5' 3 (1.6 m)   Wt 181 lb 3.2 oz (82.2 kg)   SpO2 98%   BMI 32.10 kg/m   Wt Readings from  Last 5 Encounters:  03/01/24 181 lb 3.2 oz (82.2 kg)  01/08/24 182 lb 9.6 oz (82.8 kg)  11/17/23 175 lb (79.4 kg)  10/31/23 179 lb 9.6 oz (81.5 kg)  08/02/23 182 lb (82.6 kg)    BMI Readings from Last 5 Encounters:  03/01/24 32.10 kg/m  01/08/24 32.35 kg/m  11/17/23 31.00 kg/m  10/31/23 31.81 kg/m  08/02/23 32.24 kg/m     Physical Exam General: Sitting in chair, no acute distress Eyes: EOMI, no icterus Neck: Supple, no JVP Pulmonary: Distant, clear Cardiovascular: Regular rate and rhythm, no murmur Abdomen: Nondistended, bowel sounds present MSK: No synovitis, no joint effusion Neuro: Normal gait, no weakness Psych: Normal mood, full affect   Assessment & Plan:   Chronic cough and recurrent bronchitis: Present for about 5 months November 2023 to April 2024.  Waxing and waning of symptoms with some nasal congestion.  Prior history of asthma per review of chart.  Chest x-ray limits of the symptoms 05/2022 clear on my review and interpretation.  Since he is self resolved after several months.  She does again report report recurrent bronchitis symptoms in the past.  Sounds like most helpful or improve most with prednisone .  Most likely related to asthma or cough variant asthma.  Fortunately over the last year, minimal symptoms with 1 episode of sore throat that prompted her taking the prednisone  and Z-Pak to have on hand.  This seemed to help.  Symptoms were not very long it seems.  New prescription for prednisone  and Z-Pak to have on hand given reliable improvement in the past with this.  She declines maintenance inhaler therapy.  If episodes of bronchitis, cough recur more frequently need to reexamine use of  maintenance inhaler.  Okay to continue albuterol  as needed for now.   Return in about 1 year (around 03/01/2025) for f/u Dr. Annella.   Tanya JONELLE Annella, MD 03/01/2024   This appointment required 62 minutes of patient care (this includes precharting, chart review,  review of results, face-to-face care, etc.).

## 2024-03-01 NOTE — Patient Instructions (Signed)
 I prescribed an antibiotic called azithromycin  for a Z-Pak as well as a prednisone  taper to take as needed if develop bronchitis again  If this only happens once or so a year or less that is okay and we can continue what we are doing.  If the episodes occur more frequently we should consider a daily inhaler to reduce underlying inflammation and prevent such severe bronchitis symptoms.  Continue follow-up with the ENT doctor for the ear pain and ear infections.  Return to clinic in 1 year or sooner as needed with Dr. Annella

## 2024-03-06 ENCOUNTER — Ambulatory Visit (INDEPENDENT_AMBULATORY_CARE_PROVIDER_SITE_OTHER)

## 2024-03-06 DIAGNOSIS — E538 Deficiency of other specified B group vitamins: Secondary | ICD-10-CM | POA: Diagnosis not present

## 2024-03-06 MED ORDER — CYANOCOBALAMIN 1000 MCG/ML IJ SOLN
1000.0000 ug | Freq: Once | INTRAMUSCULAR | Status: AC
  Administered 2024-03-06: 1000 ug via INTRAMUSCULAR

## 2024-03-06 NOTE — Progress Notes (Signed)
 Pt here for monthly B12 injection per   B12 given IM and pt tolerated injection well.  Patient responded well to injection

## 2024-03-14 ENCOUNTER — Other Ambulatory Visit: Payer: Self-pay | Admitting: Internal Medicine

## 2024-03-14 DIAGNOSIS — F411 Generalized anxiety disorder: Secondary | ICD-10-CM

## 2024-04-05 ENCOUNTER — Ambulatory Visit

## 2024-04-17 DIAGNOSIS — H04123 Dry eye syndrome of bilateral lacrimal glands: Secondary | ICD-10-CM | POA: Diagnosis not present

## 2024-04-17 DIAGNOSIS — H401131 Primary open-angle glaucoma, bilateral, mild stage: Secondary | ICD-10-CM | POA: Diagnosis not present

## 2024-04-30 ENCOUNTER — Telehealth: Payer: Self-pay | Admitting: Pharmacy Technician

## 2024-04-30 NOTE — Telephone Encounter (Signed)
   Pharmacy Patient Advocate Encounter   Received notification from CoverMyMeds that prior authorization for repatha  is required/requested.   Insurance verification completed.   The patient is insured through [facility] ADVANTAGE/RX ADVANCE.   Per test claim: PA required; PA submitted to above mentioned insurance via Latent Key/confirmation #/EOC [facility] Status is pending

## 2024-05-01 NOTE — Telephone Encounter (Signed)
 Pharmacy Patient Advocate Encounter  Received notification from [employer] that Prior Authorization for repatha  has been APPROVED from 04/30/24 to 04/30/25   PA #/Case ID/Reference #: [id number]

## 2024-05-06 ENCOUNTER — Ambulatory Visit (INDEPENDENT_AMBULATORY_CARE_PROVIDER_SITE_OTHER): Admitting: Internal Medicine

## 2024-05-06 ENCOUNTER — Encounter: Payer: Self-pay | Admitting: Internal Medicine

## 2024-05-06 VITALS — BP 136/82 | HR 62 | Temp 98.2°F | Ht 63.0 in | Wt 180.4 lb

## 2024-05-06 DIAGNOSIS — E039 Hypothyroidism, unspecified: Secondary | ICD-10-CM

## 2024-05-06 DIAGNOSIS — I10 Essential (primary) hypertension: Secondary | ICD-10-CM

## 2024-05-06 DIAGNOSIS — E538 Deficiency of other specified B group vitamins: Secondary | ICD-10-CM

## 2024-05-06 DIAGNOSIS — L719 Rosacea, unspecified: Secondary | ICD-10-CM | POA: Insufficient documentation

## 2024-05-06 DIAGNOSIS — E782 Mixed hyperlipidemia: Secondary | ICD-10-CM

## 2024-05-06 DIAGNOSIS — K7581 Nonalcoholic steatohepatitis (NASH): Secondary | ICD-10-CM

## 2024-05-06 DIAGNOSIS — F411 Generalized anxiety disorder: Secondary | ICD-10-CM | POA: Diagnosis not present

## 2024-05-06 DIAGNOSIS — I2510 Atherosclerotic heart disease of native coronary artery without angina pectoris: Secondary | ICD-10-CM | POA: Diagnosis not present

## 2024-05-06 DIAGNOSIS — C4402 Squamous cell carcinoma of skin of lip: Secondary | ICD-10-CM | POA: Insufficient documentation

## 2024-05-06 DIAGNOSIS — L570 Actinic keratosis: Secondary | ICD-10-CM | POA: Insufficient documentation

## 2024-05-06 LAB — HEPATIC FUNCTION PANEL
ALT: 19 U/L (ref 0–35)
AST: 24 U/L (ref 0–37)
Albumin: 4.2 g/dL (ref 3.5–5.2)
Alkaline Phosphatase: 96 U/L (ref 39–117)
Bilirubin, Direct: 0.1 mg/dL (ref 0.0–0.3)
Total Bilirubin: 0.5 mg/dL (ref 0.2–1.2)
Total Protein: 6.6 g/dL (ref 6.0–8.3)

## 2024-05-06 LAB — BASIC METABOLIC PANEL WITH GFR
BUN: 12 mg/dL (ref 6–23)
CO2: 27 meq/L (ref 19–32)
Calcium: 9 mg/dL (ref 8.4–10.5)
Chloride: 105 meq/L (ref 96–112)
Creatinine, Ser: 0.79 mg/dL (ref 0.40–1.20)
GFR: 69.26 mL/min (ref >60.00)
Glucose, Bld: 103 mg/dL — ABNORMAL HIGH (ref 70–99)
Potassium: 4 meq/L (ref 3.5–5.1)
Sodium: 139 meq/L (ref 135–145)

## 2024-05-06 LAB — CBC WITH DIFFERENTIAL/PLATELET
Basophils Absolute: 0 K/uL (ref 0.0–0.1)
Basophils Relative: 0.5 % (ref 0.0–3.0)
Eosinophils Absolute: 0.1 K/uL (ref 0.0–0.7)
Eosinophils Relative: 2 % (ref 0.0–5.0)
HCT: 41.4 % (ref 36.0–46.0)
Hemoglobin: 13.9 g/dL (ref 12.0–15.0)
Lymphocytes Relative: 17.7 % (ref 12.0–46.0)
Lymphs Abs: 0.7 K/uL (ref 0.7–4.0)
MCHC: 33.5 g/dL (ref 30.0–36.0)
MCV: 87.1 fl (ref 78.0–100.0)
Monocytes Absolute: 0.2 K/uL (ref 0.1–1.0)
Monocytes Relative: 5.8 % (ref 3.0–12.0)
Neutro Abs: 3 K/uL (ref 1.4–7.7)
Neutrophils Relative %: 74 % (ref 43.0–77.0)
Platelets: 136 K/uL — ABNORMAL LOW (ref 150.0–400.0)
RBC: 4.76 Mil/uL (ref 3.87–5.11)
RDW: 14.1 % (ref 11.5–15.5)
WBC: 4 K/uL (ref 4.0–10.5)

## 2024-05-06 LAB — LIPID PANEL
Cholesterol: 138 mg/dL (ref 0–200)
HDL: 40.7 mg/dL (ref >39.00)
LDL Cholesterol: 80 mg/dL (ref 0–99)
NonHDL: 97.31
Total CHOL/HDL Ratio: 3
Triglycerides: 86 mg/dL (ref 0.0–149.0)
VLDL: 17.2 mg/dL (ref 0.0–40.0)

## 2024-05-06 LAB — PROTIME-INR
INR: 1 ratio (ref 0.8–1.0)
Prothrombin Time: 11 s (ref 9.6–13.1)

## 2024-05-06 MED ORDER — ESCITALOPRAM OXALATE 20 MG PO TABS: 20.0000 mg | ORAL_TABLET | Freq: Every day | ORAL | 1 refills | Status: AC

## 2024-05-06 MED ORDER — CYANOCOBALAMIN 1000 MCG/ML IJ SOLN
1000.0000 ug | Freq: Once | INTRAMUSCULAR | Status: AC
  Administered 2024-05-06: 1000 ug via INTRAMUSCULAR

## 2024-05-06 MED ORDER — LEVOTHYROXINE SODIUM 25 MCG PO TABS
25.0000 ug | ORAL_TABLET | Freq: Every day | ORAL | 1 refills | Status: AC
Start: 2024-05-06 — End: ?

## 2024-05-06 MED ORDER — ALPRAZOLAM 0.5 MG PO TABS: 0.5000 mg | ORAL_TABLET | Freq: Three times a day (TID) | ORAL | 0 refills | Status: AC | PRN

## 2024-05-06 NOTE — Patient Instructions (Addendum)
 Coronary Artery Disease, Female Coronary artery disease (CAD) is a condition in which the arteries that lead to the heart (coronary arteries) become narrow or blocked. The narrowing or blockage can lead to decreased blood flow to the heart. Prolonged reduced blood flow can cause a heart attack (myocardial infarction, or MI). This condition may also be called coronary heart disease. CAD is the most common type of heart disease, and heart disease is the leading cause of death in women. It is important to understand what causes CAD and how it is treated. What are the causes? CAD is most often caused by atherosclerosis. This is the buildup of fat and cholesterol (plaque) on the inside of the arteries. Over time, the plaque may narrow or block the artery, reducing blood flow to the heart. Plaque can also become weak and break off within a coronary artery and cause a sudden blockage. Other less common causes of CAD include: A blood clot or a piece of another substance that blocks the flow of blood in a coronary artery (embolism). A tearing of the artery (spontaneous coronary artery dissection). An enlargement of an artery (aneurysm). Inflammation (vasculitis) in the artery wall. What increases the risk? The following factors may make you more likely to develop this condition: Age. Women older than 55 years are at a greater risk of CAD. Family history of CAD. High blood pressure (hypertension). Diabetes. High cholesterol levels. Obesity. Menopause. All postmenopausal women are at greater risk of CAD. Women who have experienced menopause between the ages of 39 and 58 (early menopause) are at a higher risk of CAD. Women who have experienced menopause before age 80 (premature menopause) are at a very high risk of CAD. Other risk factors include: Tobacco use. Excessive alcohol use. Lack of exercise. A diet high in saturated and trans fats, such as fried food and processed meat. What are the signs or  symptoms? Many people do not have any symptoms during the early stages of CAD. As the condition progresses, symptoms may include: Chest pain (angina). The pain can: Feel like crushing or squeezing, or like a tightness, pressure, fullness, or heaviness in the chest. Last more than a few minutes or can stop and recur. The pain tends to get worse with exercise or stress and to fade with rest. Pain in the arms, neck, jaw, ear, or back. Unexplained heartburn or indigestion. Shortness of breath. Nausea. Sudden light-headedness. Sudden cold sweats. Fluttering or fast heartbeat (palpitations). Many women have chest discomfort and the other symptoms. However, women often have unusual (atypical) symptoms, such as: Fatigue. Vomiting. Unexplained feelings of nervousness or anxiety. Unexplained weakness. Dizziness or fainting. How is this diagnosed? This condition is diagnosed based on: Your family and medical history. A physical exam. Tests. These may include: A test to check the electrical signals in your heart (electrocardiogram). Exercise stress test. This looks for signs of blockage when the heart is stressed with exercise, such as running on a treadmill. Pharmacologic stress test. This test looks for signs of blockage when the heart is being stressed with a medicine. Blood tests to check levels of cardiac enzymes such as troponin and creatine kinase. Coronary angiogram. This is a procedure to look at the coronary arteries to see if there is any blockage. During this test, a dye is injected into your arteries so they appear on an X-ray. Coronary artery CT scan. This scan helps detect calcium deposits in your coronary arteries. Calcium deposits are an indicator of CAD. A test that uses  sound waves to take a picture of your heart (echocardiogram). How is this treated? This condition may be treated by: Healthy lifestyle changes to reduce risk factors. Medicines such as: Antiplatelet medicines  such as clopidogrel or aspirin. These help to prevent blood clots. Nitroglycerin. Blood pressure medicines. Cholesterol-lowering medicine. Coronary angioplasty and stenting. During this procedure, a thin, flexible tube is inserted through a blood vessel and into a blocked artery. A balloon or similar device on the end of the tube is inflated to open up the artery. In some cases, a small, mesh tube (stent) is inserted into the artery to keep it open. Coronary artery bypass surgery. During this surgery, veins or arteries from other parts of the body are used to create a bypass around the blockage and allow blood to reach your heart. Follow these instructions at home: Medicines Take over-the-counter and prescription medicines only as told by your health care provider. Do not take the following medicines unless your health care provider approves: NSAIDs, such as ibuprofen, naproxen, or celecoxib. Vitamin supplements that contain vitamin A, vitamin E, or both. Hormone replacement therapy that contains estrogen with or without progestin. Lifestyle Follow an exercise program approved by your health care provider. Ask your health care provider if cardiac rehab is appropriate. Maintain a healthy weight or lose weight as approved by your health care provider. Learn to manage stress or try to limit your stress. Ask your health care provider for suggestions if you need help. Get screened for depression and seek treatment, if needed. Do not use any products that contain nicotine or tobacco. These products include cigarettes, chewing tobacco, and vaping devices, such as e-cigarettes. If you need help quitting, ask your health care provider. Eating and drinking  Follow a heart-healthy diet. A dietitian can help educate you about healthy food options and changes. In general, eat plenty of fruits and vegetables, lean meats, and whole grains. Avoid foods high in: Sugar. Salt (sodium). Saturated fats, such as  processed or fatty meat. Trans fats, such as fried food. Use healthy cooking methods such as roasting, grilling, broiling, baking, poaching, steaming, or stir-frying. Do not drink alcohol if: Your health care provider tells you not to drink. You are pregnant, may be pregnant, or are planning to become pregnant. If you drink alcohol: Limit how much you have to 0-1 drink a day. Know how much alcohol is in your drink. In the [place] one drink equals one 12 oz bottle of beer (355 mL), one 5 oz glass of wine (148 mL), or one 1 oz glass of hard liquor (44 mL). General instructions Manage any other health conditions, such as high cholesterol, hypertension, and diabetes. These conditions affect your heart. Your health care provider may ask you to monitor your blood pressure. Keep all follow-up visits. This is important. Get help right away if: You have pain in your chest, neck, ear, arm, jaw, stomach, or back that: Lasts more than a few minutes. Is recurring. Is not relieved by taking medicine under your tongue (sublingual nitroglycerin). You have profuse sweating without cause. You have unexplained: Heartburn or indigestion. Shortness of breath or difficulty breathing. Fluttering or fast heartbeat (palpitations). Fatigue or weakness. Nausea or vomiting. Feelings of nervousness or anxiety. You have sudden light-headedness or dizziness. You faint. These symptoms may be an emergency. Get help right away. Call 911. Do not wait to see if the symptoms will go away. Do not drive yourself to the hospital. Summary Coronary artery disease (CAD) is a condition in  which the arteries that lead to the heart (coronary arteries) become narrow or blocked. Prolonged reduced blood flow can cause a heart attack. Many women have chest discomfort and other common symptoms of CAD. However, women often have unusual (atypical) symptoms, such as fatigue, vomiting, weakness, or dizziness. CAD can be treated with  lifestyle changes, medicines, coronary angioplasty or stents, coronary artery bypass surgery, or a combination of these treatments. Keep all follow-up visits. This is important. This information is not intended to replace advice given to you by your health care provider. Make sure you discuss any questions you have with your health care provider. Document Revised: 05/05/2021 Document Reviewed: 05/05/2021 Elsevier Patient Education  2024 [employer].

## 2024-05-06 NOTE — Progress Notes (Signed)
 Subjective:  Patient ID: [name], female    DOB: [birth year]  Age: 83 y.o. MRN: [id number]  CC: Hypertension, Hyperlipidemia, and Coronary Artery Disease   HPI [name] presents for f/up ----  Discussed the use of AI scribe software for clinical note transcription with the patient, who gave verbal consent to proceed.  History of Present Illness [name] is an 83 year old female with coronary artery disease who presents with palpitations and fatigue.  She experiences episodes of palpitations characterized by a pounding heart sensation, which began after a stent placement. These episodes are not associated with irregular heartbeats but are sometimes accompanied by shortness of breath when she feels excited. No dizziness or lightheadedness is reported.  She reports fatigue during physical activity, although she remains active. No chest pain or shortness of breath is noted unless she becomes very nervous.  She experiences swelling in her legs and feet by the end of the day, which is relieved by elevating her legs. The swelling leaves marks from her socks.  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 She is scheduled to follow up with her dermatologist on the 24th of this month.  She is currently taking Plavix  and Repatha , and she reports bruising but no bleeding. She received a flu shot on October 13th or 11th and has not had a COVID or RSV vaccine recently.   Outpatient Medications Prior to Visit  Medication Sig Dispense Refill   acetaminophen  (TYLENOL ) 500 MG tablet Take 500 mg by mouth every 6 (six) hours as needed for moderate pain (pain score 4-6).     aspirin  EC 81 MG tablet Take 1 tablet (81 mg total) by mouth daily. Swallow whole. 30 tablet 12   brimonidine  (ALPHAGAN  P) 0.1 % SOLN Place 1 drop into both eyes 4 (four) times daily.     clopidogrel  (PLAVIX ) 75 MG tablet Take 1 tablet (75 mg total) by mouth daily  with breakfast. 90 tablet 3   dicyclomine  (BENTYL ) 10 MG capsule TAKE 1 CAPSULE BY MOUTH 4 TIMES DAILY. (BEFORE MEALS AND AT BEDTIME) 360 capsule 1   dorzolamide  (TRUSOPT ) 2 % ophthalmic solution Place 1 drop into both eyes 2 (two) times daily.     Evolocumab  (REPATHA  SURECLICK) 140 MG/ML SOAJ Inject 140 mg into the skin every 14 (fourteen) days. 6 mL 3   famotidine  (PEPCID ) 20 MG tablet Take 1 tablet by mouth twice daily 30 tablet 0   furosemide  (LASIX ) 20 MG tablet Take 1 tablet (20 mg total) by mouth every morning. 90 tablet 3   isosorbide  mononitrate (IMDUR ) 30 MG 24 hr tablet Take 1 tablet (30 mg total) by mouth daily. 90 tablet 3   nystatin  (MYCOSTATIN /NYSTOP ) powder Apply 1 Application topically 3 (three) times daily. (Patient taking differently: Apply 1 Application topically daily as needed (dry skin).) 15 g 0   potassium chloride  SA (KLOR-CON  M) 20 MEQ tablet Take 1 tablet by mouth twice daily 180 tablet 3   SIMBRINZA 1-0.2 % SUSP Apply 1 drop to eye 2 (two) times daily.     VASCEPA  1 g capsule TAKE 2 CAPSULES BY MOUTH IN THE MORNING AND 2 AT BEDTIME 360 capsule 3   ALPRAZolam  (XANAX ) 0.5 MG tablet Take 1 tablet (0.5 mg total) by mouth 3 (three) times daily as needed. for anxiety 270 tablet 0   escitalopram  (LEXAPRO ) 20 MG tablet TAKE 1 TABLET BY MOUTH AT BEDTIME 90  tablet 0   levothyroxine  (SYNTHROID ) 25 MCG tablet Take 1 tablet (25 mcg total) by mouth daily before breakfast. 90 tablet 1   No facility-administered medications prior to visit.    ROS Review of Systems  Constitutional:  Negative for appetite change, chills, diaphoresis, fatigue and fever.  HENT: Negative.    Eyes: Negative.   Respiratory: Negative.  Negative for cough, chest tightness, shortness of breath and wheezing.   Cardiovascular:  Negative for chest pain, palpitations and leg swelling.  Gastrointestinal:  Negative for abdominal pain, constipation, diarrhea, nausea and vomiting.  Endocrine: Negative.    Genitourinary: Negative.  Negative for difficulty urinating.  Musculoskeletal: Negative.  Negative for back pain, joint swelling and myalgias.  Skin: Negative.  Negative for color change.  Neurological: Negative.  Negative for dizziness, weakness and light-headedness.  Hematological:  Negative for adenopathy. Bruises/bleeds easily.  Psychiatric/Behavioral:  Negative for confusion, decreased concentration and suicidal ideas. The patient is nervous/anxious.     Objective:  BP 136/82 (BP Location: Left Arm, Patient Position: Sitting, Cuff Size: Normal)   Pulse 62   Temp 98.2 F (36.8 C) (Oral)   Ht 5' 3 (1.6 m)   Wt 180 lb 6.4 oz (81.8 kg)   SpO2 95%   BMI 31.96 kg/m   BP Readings from Last 3 Encounters:  05/06/24 136/82  03/01/24 122/62  01/08/24 134/70    Wt Readings from Last 3 Encounters:  05/06/24 180 lb 6.4 oz (81.8 kg)  03/01/24 181 lb 3.2 oz (82.2 kg)  01/08/24 182 lb 9.6 oz (82.8 kg)    Physical Exam Vitals reviewed.  Constitutional:      Appearance: Normal appearance.  HENT:     Mouth/Throat:     Mouth: Mucous membranes are moist.  Eyes:     General: No scleral icterus.    Conjunctiva/sclera: Conjunctivae normal.  Cardiovascular:     Rate and Rhythm: Normal rate and regular rhythm.     Heart sounds: S1 normal and S2 normal. Murmur heard.     Systolic murmur is present with a grade of 1/6.     No friction rub. No gallop.     Comments: EKG--- NSR, 64 bpm LAD Anterior infarct pattern  ++artifact Unchanged  Pulmonary:     Effort: Pulmonary effort is normal.     Breath sounds: No stridor. No wheezing, rhonchi or rales.  Abdominal:     General: Abdomen is flat.     Palpations: There is no mass.     Tenderness: There is no abdominal tenderness. There is no guarding.     Hernia: No hernia is present.  Musculoskeletal:        General: Normal range of motion.     Cervical back: Neck supple.     Right lower leg: No edema.     Left lower leg: No edema.   Lymphadenopathy:     Cervical: No cervical adenopathy.  Skin:    General: Skin is warm and dry.     Coloration: Skin is not jaundiced or pale.     Findings: Bruising present. No erythema, lesion or rash.  Neurological:     General: No focal deficit present.     Mental Status: She is alert and oriented to person, place, and time.  Psychiatric:        Mood and Affect: Mood normal.        Behavior: Behavior normal.     Lab Results  Component Value Date   WBC 4.0 05/06/2024  HGB 13.9 05/06/2024   HCT 41.4 05/06/2024   PLT 136.0 (L) 05/06/2024   GLUCOSE 103 (H) 05/06/2024   CHOL 138 05/06/2024   TRIG 86.0 05/06/2024   HDL 40.70 05/06/2024   LDLDIRECT 174.0 02/27/2019   LDLCALC 80 05/06/2024   ALT 19 05/06/2024   AST 24 05/06/2024   NA 139 05/06/2024   K 4.0 05/06/2024   CL 105 05/06/2024   CREATININE 0.79 05/06/2024   BUN 12 05/06/2024   CO2 27 05/06/2024   TSH 4.31 10/31/2023   INR 1.0 05/06/2024   HGBA1C 5.6 10/29/2020    DG Bone Density Result Date: 07/18/2023 Table formatting from the original result was not included. Date of study: 07/18/2023 Exam: DUAL X-RAY ABSORPTIOMETRY (DXA) FOR BONE MINERAL DENSITY (BMD) Instrument: [employer] Requesting Provider: PCP Indication: follow up for low BMD Comparison: 12/16/2009 Clinical data: Pt is a 83 y.o. female without previous history of fracture. On vitamin D . Results:  Lumbar spine L1- L3 (L4) Femoral neck (FN) 33% distal radius T-score -1.0 RFN: -1.6 LFN: -1.6 n/a Change in BMD from previous DXA test (%) -3.7%* -10.7%* n/a (*) statistically significant Assessment: the BMD is low according to the [facility] classification for osteoporosis (see below). Fracture risk: moderate FRAX score: 10 year major osteoporotic risk: 13.5%. 10 year hip fracture risk: 3.6%. The thresholds for treatment are 20% and 3%, respectively. Comments: the technical quality of the study is good. Evaluation for secondary causes should be considered if  clinically indicated. Recommend optimizing calcium  (1200 mg/day) and vitamin D  (800 IU/day) intake. Followup: Repeat BMD is appropriate after 2 years or after 1-2 years if starting treatment. WHO criteria for diagnosis of osteoporosis in postmenopausal women and in men 67 y/o or older: - normal: T-score -1.0 to + 1.0 - osteopenia/low bone density: T-score between -2.5 and -1.0 - osteoporosis: T-score below -2.5 - severe osteoporosis: T-score below -2.5 with history of fragility fracture Note: although not part of the WHO classification, the presence of a fragility fracture, regardless of the T-score, should be considered diagnostic of osteoporosis, provided other causes for the fracture have been excluded. Treatment: The National Osteoporosis Foundation recommends that treatment be considered in postmenopausal women and men age 25 or older with: 1. Hip or vertebral (clinical or morphometric) fracture 2. T-score of - 2.5 or lower at the spine or hip 3. 10-year fracture probability by FRAX of at least 20% for a major osteoporotic fracture and 3% for a hip fracture Lela Fendt, MD [facility]    Fibrosis 4 Score = 3.36  Fib-4 interpretation is not validated for people under 35 or over 47 years of age. However, scores under 2.0 are generally considered low risk.   Assessment & Plan:  B12 deficiency -     Cyanocobalamin  -     CBC with Differential/Platelet; Future  Essential hypertension- BP is well controlled. EKG is negative for LVH. -     EKG 12-Lead -     Basic metabolic panel with GFR; Future  Coronary artery disease involving native coronary artery of native heart without angina pectoris -     EKG 12-Lead -     Lipid panel; Future  Nonalcoholic steatohepatitis (NASH) -     Hepatic function panel; Future -     Protime-INR; Future  Primary hypertension -     Basic metabolic panel with GFR; Future  Mixed hyperlipidemia- She has achieved her LDL goal. -     Lipid panel;  Future -  Hepatic function panel; Future     Follow-up: Return in about 6 months (around 11/03/2024).  Debby Molt, MD

## 2024-05-07 ENCOUNTER — Ambulatory Visit: Payer: Self-pay | Admitting: Internal Medicine

## 2024-05-14 ENCOUNTER — Telehealth: Payer: Self-pay | Admitting: Pharmacist

## 2024-05-14 NOTE — Telephone Encounter (Signed)
   05/14/2024 Name: [name] MRN: [id number] DOB: June 01, 1941   Patient has been re-enrolled in [facility] for Vascepa  and Repatha , start date 06/12/2024, expires 12/232026. Confirmed with Theoplis (daughter) that her insurance is remaining the same next year. Completed diagnosis verification form.   Darrelyn Drum, PharmD, BCPS, CPP Clinical Pharmacist Practitioner [facility] [phone]

## 2024-05-18 ENCOUNTER — Encounter (HOSPITAL_COMMUNITY): Admission: EM | Disposition: A | Payer: Self-pay | Source: Home / Self Care | Attending: Emergency Medicine

## 2024-05-18 ENCOUNTER — Emergency Department (HOSPITAL_COMMUNITY)

## 2024-05-18 ENCOUNTER — Observation Stay (HOSPITAL_COMMUNITY): Admitting: Anesthesiology

## 2024-05-18 ENCOUNTER — Encounter (HOSPITAL_COMMUNITY): Payer: Self-pay | Admitting: Family Medicine

## 2024-05-18 ENCOUNTER — Other Ambulatory Visit: Payer: Self-pay

## 2024-05-18 ENCOUNTER — Observation Stay (HOSPITAL_COMMUNITY)
Admission: EM | Admit: 2024-05-18 | Discharge: 2024-05-19 | Disposition: A | Discharge status: Home / Self Care | Attending: General Surgery | Admitting: General Surgery

## 2024-05-18 DIAGNOSIS — F411 Generalized anxiety disorder: Secondary | ICD-10-CM | POA: Insufficient documentation

## 2024-05-18 DIAGNOSIS — K3580 Unspecified acute appendicitis: Secondary | ICD-10-CM | POA: Diagnosis not present

## 2024-05-18 DIAGNOSIS — K219 Gastro-esophageal reflux disease without esophagitis: Secondary | ICD-10-CM | POA: Insufficient documentation

## 2024-05-18 DIAGNOSIS — Z7982 Long term (current) use of aspirin: Secondary | ICD-10-CM | POA: Diagnosis not present

## 2024-05-18 DIAGNOSIS — E66811 Obesity, class 1: Secondary | ICD-10-CM | POA: Diagnosis not present

## 2024-05-18 DIAGNOSIS — H409 Unspecified glaucoma: Secondary | ICD-10-CM | POA: Insufficient documentation

## 2024-05-18 DIAGNOSIS — K5730 Diverticulosis of large intestine without perforation or abscess without bleeding: Secondary | ICD-10-CM | POA: Diagnosis not present

## 2024-05-18 DIAGNOSIS — E039 Hypothyroidism, unspecified: Secondary | ICD-10-CM | POA: Insufficient documentation

## 2024-05-18 DIAGNOSIS — R1030 Lower abdominal pain, unspecified: Secondary | ICD-10-CM | POA: Diagnosis not present

## 2024-05-18 DIAGNOSIS — I4581 Long QT syndrome: Secondary | ICD-10-CM | POA: Diagnosis not present

## 2024-05-18 DIAGNOSIS — I509 Heart failure, unspecified: Secondary | ICD-10-CM | POA: Diagnosis not present

## 2024-05-18 DIAGNOSIS — I5032 Chronic diastolic (congestive) heart failure: Secondary | ICD-10-CM

## 2024-05-18 DIAGNOSIS — Z8673 Personal history of transient ischemic attack (TIA), and cerebral infarction without residual deficits: Secondary | ICD-10-CM | POA: Insufficient documentation

## 2024-05-18 DIAGNOSIS — K353 Acute appendicitis with localized peritonitis, without perforation or gangrene: Secondary | ICD-10-CM | POA: Diagnosis not present

## 2024-05-18 DIAGNOSIS — J45909 Unspecified asthma, uncomplicated: Secondary | ICD-10-CM | POA: Insufficient documentation

## 2024-05-18 DIAGNOSIS — D696 Thrombocytopenia, unspecified: Secondary | ICD-10-CM | POA: Diagnosis not present

## 2024-05-18 DIAGNOSIS — R109 Unspecified abdominal pain: Secondary | ICD-10-CM | POA: Diagnosis present

## 2024-05-18 DIAGNOSIS — I110 Hypertensive heart disease with heart failure: Secondary | ICD-10-CM

## 2024-05-18 DIAGNOSIS — I10 Essential (primary) hypertension: Secondary | ICD-10-CM | POA: Diagnosis not present

## 2024-05-18 DIAGNOSIS — I2510 Atherosclerotic heart disease of native coronary artery without angina pectoris: Secondary | ICD-10-CM | POA: Diagnosis not present

## 2024-05-18 DIAGNOSIS — N289 Disorder of kidney and ureter, unspecified: Secondary | ICD-10-CM | POA: Diagnosis not present

## 2024-05-18 DIAGNOSIS — Z85828 Personal history of other malignant neoplasm of skin: Secondary | ICD-10-CM | POA: Diagnosis not present

## 2024-05-18 DIAGNOSIS — Z7902 Long term (current) use of antithrombotics/antiplatelets: Secondary | ICD-10-CM | POA: Insufficient documentation

## 2024-05-18 DIAGNOSIS — K3533 Acute appendicitis with perforation and localized peritonitis, with abscess: Secondary | ICD-10-CM

## 2024-05-18 DIAGNOSIS — Z6831 Body mass index (BMI) 31.0-31.9, adult: Secondary | ICD-10-CM | POA: Diagnosis not present

## 2024-05-18 HISTORY — PX: LAPAROSCOPIC APPENDECTOMY: SHX408

## 2024-05-18 LAB — I-STAT CHEM 8, ED
BUN: 12 mg/dL (ref 8–23)
Calcium, Ion: 1.12 mmol/L — ABNORMAL LOW (ref 1.15–1.40)
Chloride: 105 mmol/L (ref 98–111)
Creatinine, Ser: 0.8 mg/dL (ref 0.44–1.00)
Glucose, Bld: 103 mg/dL — ABNORMAL HIGH (ref 70–99)
HCT: 37 % (ref 36.0–46.0)
Hemoglobin: 12.6 g/dL (ref 12.0–15.0)
Potassium: 3.1 mmol/L — ABNORMAL LOW (ref 3.5–5.1)
Sodium: 140 mmol/L (ref 135–145)
TCO2: 22 mmol/L (ref 22–32)

## 2024-05-18 LAB — CBC WITH DIFFERENTIAL/PLATELET
Abs Immature Granulocytes: 0.03 K/uL (ref 0.00–0.07)
Basophils Absolute: 0 K/uL (ref 0.0–0.1)
Basophils Relative: 0 %
Eosinophils Absolute: 0 K/uL (ref 0.0–0.5)
Eosinophils Relative: 0 %
HCT: 38.2 % (ref 36.0–46.0)
Hemoglobin: 12.7 g/dL (ref 12.0–15.0)
Immature Granulocytes: 0 %
Lymphocytes Relative: 8 %
Lymphs Abs: 0.7 K/uL (ref 0.7–4.0)
MCH: 29.2 pg (ref 26.0–34.0)
MCHC: 33.2 g/dL (ref 30.0–36.0)
MCV: 87.8 fL (ref 80.0–100.0)
Monocytes Absolute: 0.6 K/uL (ref 0.1–1.0)
Monocytes Relative: 6 %
Neutro Abs: 7.4 K/uL (ref 1.7–7.7)
Neutrophils Relative %: 86 %
Platelets: 118 K/uL — ABNORMAL LOW (ref 150–400)
RBC: 4.35 MIL/uL (ref 3.87–5.11)
RDW: 13.2 % (ref 11.5–15.5)
WBC: 8.7 K/uL (ref 4.0–10.5)
nRBC: 0 % (ref 0.0–0.2)

## 2024-05-18 LAB — COMPREHENSIVE METABOLIC PANEL WITH GFR
ALT: 26 U/L (ref 0–44)
AST: 38 U/L (ref 15–41)
Albumin: 3.4 g/dL — ABNORMAL LOW (ref 3.5–5.0)
Alkaline Phosphatase: 86 U/L (ref 38–126)
Anion gap: 8 (ref 5–15)
BUN: 11 mg/dL (ref 8–23)
CO2: 23 mmol/L (ref 22–32)
Calcium: 8.3 mg/dL — ABNORMAL LOW (ref 8.9–10.3)
Chloride: 108 mmol/L (ref 98–111)
Creatinine, Ser: 0.75 mg/dL (ref 0.44–1.00)
GFR, Estimated: >60 mL/min (ref >60)
Glucose, Bld: 107 mg/dL — ABNORMAL HIGH (ref 70–99)
Potassium: 3.1 mmol/L — ABNORMAL LOW (ref 3.5–5.1)
Sodium: 139 mmol/L (ref 135–145)
Total Bilirubin: 1.2 mg/dL (ref 0.0–1.2)
Total Protein: 5.7 g/dL — ABNORMAL LOW (ref 6.5–8.1)

## 2024-05-18 LAB — LIPASE, BLOOD: Lipase: 23 U/L (ref 11–51)

## 2024-05-18 SURGERY — APPENDECTOMY, LAPAROSCOPIC
Anesthesia: General | Site: Abdomen

## 2024-05-18 MED ORDER — ADULT MULTIVITAMIN W/MINERALS CH
1.0000 | ORAL_TABLET | Freq: Every day | ORAL | Status: DC
  Filled 2024-05-18: qty 1

## 2024-05-18 MED ORDER — ONDANSETRON HCL 4 MG/2ML IJ SOLN
INTRAMUSCULAR | Status: DC | PRN
  Administered 2024-05-18: 4 mg via INTRAVENOUS

## 2024-05-18 MED ORDER — DORZOLAMIDE HCL 2 % OP SOLN
1.0000 [drp] | Freq: Two times a day (BID) | OPHTHALMIC | Status: DC
  Administered 2024-05-19 (×2): 1 [drp] via OPHTHALMIC
  Filled 2024-05-18: qty 10

## 2024-05-18 MED ORDER — LIDOCAINE 2% (20 MG/ML) 5 ML SYRINGE
INTRAMUSCULAR | Status: AC
Start: 2024-05-18 — End: 2024-05-18
  Filled 2024-05-18: qty 20

## 2024-05-18 MED ORDER — BUPIVACAINE-EPINEPHRINE 0.25% -1:200000 IJ SOLN
INTRAMUSCULAR | Status: DC | PRN
  Administered 2024-05-18: 10 mL

## 2024-05-18 MED ORDER — ACETAMINOPHEN 10 MG/ML IV SOLN
1000.0000 mg | Freq: Once | INTRAVENOUS | Status: DC | PRN
  Administered 2024-05-18: 1000 mg via INTRAVENOUS

## 2024-05-18 MED ORDER — FAMOTIDINE 20 MG PO TABS
20.0000 mg | ORAL_TABLET | Freq: Two times a day (BID) | ORAL | Status: DC
  Administered 2024-05-19 (×2): 20 mg via ORAL
  Filled 2024-05-18 (×2): qty 1

## 2024-05-18 MED ORDER — LEVOTHYROXINE SODIUM 25 MCG PO TABS
25.0000 ug | ORAL_TABLET | Freq: Every day | ORAL | Status: DC
  Administered 2024-05-19: 25 ug via ORAL
  Filled 2024-05-18: qty 1

## 2024-05-18 MED ORDER — POTASSIUM CHLORIDE CRYS ER 20 MEQ PO TBCR
20.0000 meq | EXTENDED_RELEASE_TABLET | Freq: Two times a day (BID) | ORAL | Status: DC
  Administered 2024-05-19: 20 meq via ORAL
  Filled 2024-05-18: qty 1

## 2024-05-18 MED ORDER — EPHEDRINE SULFATE (PRESSORS) 25 MG/5ML IV SOSY
PREFILLED_SYRINGE | INTRAVENOUS | Status: DC | PRN
  Administered 2024-05-18 (×2): 10 mg via INTRAVENOUS

## 2024-05-18 MED ORDER — BUPIVACAINE HCL (PF) 0.25 % IJ SOLN
INTRAMUSCULAR | Status: AC
Start: 2024-05-18 — End: 2024-05-18
  Filled 2024-05-18: qty 30

## 2024-05-18 MED ORDER — MORPHINE SULFATE (PF) 4 MG/ML IV SOLN: 4.0000 mg | INTRAVENOUS | Status: DC | PRN

## 2024-05-18 MED ORDER — FENTANYL CITRATE (PF) 250 MCG/5ML IJ SOLN
INTRAMUSCULAR | Status: DC | PRN
  Administered 2024-05-18 (×2): 25 ug via INTRAVENOUS
  Administered 2024-05-18: 50 ug via INTRAVENOUS

## 2024-05-18 MED ORDER — ROCURONIUM 10MG/ML (10ML) SYRINGE FOR MEDFUSION PUMP - OPTIME
INTRAVENOUS | Status: DC | PRN
  Administered 2024-05-18: 50 mg via INTRAVENOUS

## 2024-05-18 MED ORDER — ISOSORBIDE MONONITRATE ER 30 MG PO TB24
30.0000 mg | ORAL_TABLET | Freq: Every day | ORAL | Status: DC
  Administered 2024-05-19: 30 mg via ORAL
  Filled 2024-05-18: qty 1

## 2024-05-18 MED ORDER — SUCCINYLCHOLINE CHLORIDE 200 MG/10ML IV SOSY
PREFILLED_SYRINGE | INTRAVENOUS | Status: AC
  Filled 2024-05-18: qty 20

## 2024-05-18 MED ORDER — LACTATED RINGERS IV SOLN
INTRAVENOUS | Status: DC | PRN
Start: 2024-05-18 — End: 2024-05-18

## 2024-05-18 MED ORDER — DEXAMETHASONE SOD PHOSPHATE PF 10 MG/ML IJ SOLN
INTRAMUSCULAR | Status: DC | PRN
  Administered 2024-05-18: 10 mg via INTRAVENOUS

## 2024-05-18 MED ORDER — OXYCODONE HCL 5 MG PO TABS: 5.0000 mg | ORAL_TABLET | ORAL | Status: DC | PRN

## 2024-05-18 MED ORDER — EPHEDRINE 5 MG/ML INJ
INTRAVENOUS | Status: AC
Start: 2024-05-18 — End: 2024-05-18
  Filled 2024-05-18: qty 5

## 2024-05-18 MED ORDER — SODIUM CHLORIDE (PF) 0.9 % IJ SOLN
INTRAMUSCULAR | Status: AC
  Filled 2024-05-18: qty 20

## 2024-05-18 MED ORDER — ALPRAZOLAM 0.5 MG PO TABS: 0.5000 mg | ORAL_TABLET | Freq: Every evening | ORAL | Status: DC | PRN

## 2024-05-18 MED ORDER — DROPERIDOL 2.5 MG/ML IJ SOLN: 0.6250 mg | Freq: Once | INTRAMUSCULAR | Status: DC | PRN

## 2024-05-18 MED ORDER — ROCURONIUM BROMIDE 10 MG/ML (PF) SYRINGE
PREFILLED_SYRINGE | INTRAVENOUS | Status: AC
  Filled 2024-05-18: qty 10

## 2024-05-18 MED ORDER — POTASSIUM CHLORIDE 10 MEQ/100ML IV SOLN
10.0000 meq | INTRAVENOUS | Status: AC
  Administered 2024-05-19: 10 meq via INTRAVENOUS
  Filled 2024-05-18: qty 100

## 2024-05-18 MED ORDER — ONDANSETRON HCL 4 MG/2ML IJ SOLN
INTRAMUSCULAR | Status: AC
  Filled 2024-05-18: qty 2

## 2024-05-18 MED ORDER — DICYCLOMINE HCL 10 MG PO CAPS
10.0000 mg | ORAL_CAPSULE | Freq: Three times a day (TID) | ORAL | Status: DC
  Administered 2024-05-19 (×2): 10 mg via ORAL
  Filled 2024-05-18 (×2): qty 1

## 2024-05-18 MED ORDER — SODIUM CHLORIDE 0.9 % IV SOLN
2.0000 g | Freq: Once | INTRAVENOUS | Status: AC
  Administered 2024-05-18: 2 g via INTRAVENOUS
  Filled 2024-05-18: qty 20

## 2024-05-18 MED ORDER — SODIUM CHLORIDE 0.9 % IV SOLN: 2.0000 g | INTRAVENOUS | Status: DC

## 2024-05-18 MED ORDER — PHENYLEPHRINE 80 MCG/ML (10ML) SYRINGE FOR IV PUSH (FOR BLOOD PRESSURE SUPPORT)
PREFILLED_SYRINGE | INTRAVENOUS | Status: AC
Start: 2024-05-18 — End: 2024-05-18
  Filled 2024-05-18: qty 40

## 2024-05-18 MED ORDER — 0.9 % SODIUM CHLORIDE (POUR BTL) OPTIME
TOPICAL | Status: DC | PRN
  Administered 2024-05-18: 1000 mL

## 2024-05-18 MED ORDER — SODIUM CHLORIDE 0.9 % IV BOLUS: 500.0000 mL | Freq: Once | INTRAVENOUS | Status: DC

## 2024-05-18 MED ORDER — ENSURE PLUS HIGH PROTEIN PO LIQD
237.0000 mL | Freq: Two times a day (BID) | ORAL | Status: DC
  Administered 2024-05-19: 237 mL via ORAL

## 2024-05-18 MED ORDER — HYDROMORPHONE HCL 1 MG/ML IJ SOLN
0.5000 mg | Freq: Once | INTRAMUSCULAR | Status: AC
  Administered 2024-05-18: 0.5 mg via INTRAVENOUS
  Filled 2024-05-18: qty 1

## 2024-05-18 MED ORDER — SODIUM CHLORIDE 0.9 % IR SOLN
Status: DC | PRN
Start: 2024-05-18 — End: 2024-05-18
  Administered 2024-05-18: 1000 mL

## 2024-05-18 MED ORDER — LIDOCAINE HCL (CARDIAC) PF 100 MG/5ML IV SOSY
PREFILLED_SYRINGE | INTRAVENOUS | Status: DC | PRN
  Administered 2024-05-18: 60 mg via INTRAVENOUS

## 2024-05-18 MED ORDER — FUROSEMIDE 20 MG PO TABS
20.0000 mg | ORAL_TABLET | Freq: Every morning | ORAL | Status: DC
  Administered 2024-05-19: 20 mg via ORAL
  Filled 2024-05-18: qty 1

## 2024-05-18 MED ORDER — PROPOFOL 10 MG/ML IV BOLUS
INTRAVENOUS | Status: AC
  Filled 2024-05-18: qty 20

## 2024-05-18 MED ORDER — FENTANYL CITRATE (PF) 100 MCG/2ML IJ SOLN
INTRAMUSCULAR | Status: AC
  Filled 2024-05-18: qty 2

## 2024-05-18 MED ORDER — SUGAMMADEX SODIUM 200 MG/2ML IV SOLN
INTRAVENOUS | Status: DC | PRN
  Administered 2024-05-18: 200 mg via INTRAVENOUS

## 2024-05-18 MED ORDER — BRIMONIDINE TARTRATE 0.2 % OP SOLN
1.0000 [drp] | Freq: Two times a day (BID) | OPHTHALMIC | Status: DC
  Administered 2024-05-19 (×2): 1 [drp] via OPHTHALMIC
  Filled 2024-05-18: qty 5

## 2024-05-18 MED ORDER — BRINZOLAMIDE 1 % OP SUSP
1.0000 [drp] | Freq: Two times a day (BID) | OPHTHALMIC | Status: DC
  Administered 2024-05-19 (×2): 1 [drp] via OPHTHALMIC
  Filled 2024-05-18: qty 10

## 2024-05-18 MED ORDER — ACETAMINOPHEN 10 MG/ML IV SOLN
INTRAVENOUS | Status: AC
  Filled 2024-05-18: qty 100

## 2024-05-18 MED ORDER — SODIUM CHLORIDE 0.9 % IV BOLUS
1000.0000 mL | Freq: Once | INTRAVENOUS | Status: AC
  Administered 2024-05-18: 1000 mL via INTRAVENOUS

## 2024-05-18 MED ORDER — ICOSAPENT ETHYL 1 G PO CAPS
2.0000 g | ORAL_CAPSULE | Freq: Two times a day (BID) | ORAL | Status: DC
  Administered 2024-05-19 (×2): 2 g via ORAL
  Filled 2024-05-18 (×3): qty 2

## 2024-05-18 MED ORDER — METRONIDAZOLE 500 MG/100ML IV SOLN
500.0000 mg | Freq: Two times a day (BID) | INTRAVENOUS | Status: DC
  Administered 2024-05-19: 500 mg via INTRAVENOUS
  Filled 2024-05-18: qty 100

## 2024-05-18 MED ORDER — IOHEXOL 350 MG/ML SOLN
75.0000 mL | Freq: Once | INTRAVENOUS | Status: AC | PRN
  Administered 2024-05-18: 75 mL via INTRAVENOUS

## 2024-05-18 MED ORDER — FENTANYL CITRATE (PF) 100 MCG/2ML IJ SOLN: 25.0000 ug | INTRAMUSCULAR | Status: DC | PRN

## 2024-05-18 MED ORDER — ESCITALOPRAM OXALATE 20 MG PO TABS
20.0000 mg | ORAL_TABLET | Freq: Every day | ORAL | Status: DC
  Administered 2024-05-19: 20 mg via ORAL
  Filled 2024-05-18: qty 1

## 2024-05-18 MED ORDER — METRONIDAZOLE 500 MG/100ML IV SOLN
500.0000 mg | Freq: Once | INTRAVENOUS | Status: AC
  Administered 2024-05-18: 500 mg via INTRAVENOUS
  Filled 2024-05-18: qty 100

## 2024-05-18 MED ORDER — PROPOFOL 10 MG/ML IV BOLUS
INTRAVENOUS | Status: DC | PRN
  Administered 2024-05-18: 100 mg via INTRAVENOUS

## 2024-05-18 SURGICAL SUPPLY — 32 items
BAG COUNTER SPONGE SURGICOUNT (BAG) ×2 IMPLANT
CHLORAPREP W/TINT 26 (MISCELLANEOUS) ×2 IMPLANT
COVER SURGICAL LIGHT HANDLE (MISCELLANEOUS) ×2 IMPLANT
DERMABOND ADVANCED .7 DNX12 (GAUZE/BANDAGES/DRESSINGS) ×2 IMPLANT
ELECTRODE REM PT RTRN 9FT ADLT (ELECTROSURGICAL) ×2 IMPLANT
GLOVE BIO SURGEON STRL SZ8 (GLOVE) ×2 IMPLANT
GLOVE BIOGEL PI IND STRL 8 (GLOVE) ×2 IMPLANT
GOWN STRL REUS W/ TWL LRG LVL3 (GOWN DISPOSABLE) ×4 IMPLANT
GOWN STRL REUS W/ TWL XL LVL3 (GOWN DISPOSABLE) ×2 IMPLANT
IRRIGATION SUCT STRKRFLW 2 WTP (MISCELLANEOUS) ×2 IMPLANT
KIT BASIN OR (CUSTOM PROCEDURE TRAY) ×2 IMPLANT
KIT TURNOVER KIT B (KITS) ×2 IMPLANT
MANIFOLD NEPTUNE II (INSTRUMENTS) ×2 IMPLANT
NDL 22X1.5 STRL (OR ONLY) (MISCELLANEOUS) ×2 IMPLANT
PAD ARMBOARD POSITIONER FOAM (MISCELLANEOUS) ×2 IMPLANT
POUCH RETRIEVAL ECOSAC 10 (ENDOMECHANICALS) ×2 IMPLANT
RELOAD STAPLE 45 2.6 WHT THIN (STAPLE) IMPLANT
RELOAD STAPLE 45 3.6 BLU REG (STAPLE) IMPLANT
SCISSORS LAP 5X35 DISP (ENDOMECHANICALS) IMPLANT
SET TUBE SMOKE EVAC HIGH FLOW (TUBING) ×2 IMPLANT
SHEARS HARMONIC 36 ACE (MISCELLANEOUS) ×2 IMPLANT
SOLN 0.9% NACL POUR BTL 1000ML (IV SOLUTION) ×2 IMPLANT
SOLN STERILE WATER BTL 1000 ML (IV SOLUTION) ×2 IMPLANT
STAPLER POWER ECHELON 45 WIDE (STAPLE) ×2 IMPLANT
SUT VIC AB 4-0 PS2 27 (SUTURE) ×2 IMPLANT
SUT VICRYL 0 UR6 27IN ABS (SUTURE) IMPLANT
TOWEL GREEN STERILE (TOWEL DISPOSABLE) ×2 IMPLANT
TOWEL GREEN STERILE FF (TOWEL DISPOSABLE) ×2 IMPLANT
TRAY LAPAROSCOPIC MC (CUSTOM PROCEDURE TRAY) ×2 IMPLANT
TROCAR BALLN 12MMX100 BLUNT (TROCAR) ×2 IMPLANT
TROCAR Z THREAD OPTICAL 12X100 (TROCAR) ×2 IMPLANT
TROCAR Z-THREAD OPTICAL 5X100M (TROCAR) ×2 IMPLANT

## 2024-05-18 NOTE — Op Note (Signed)
  05/18/2024  9:00 PM  PATIENT:  [name]  83 y.o. female  PRE-OPERATIVE DIAGNOSIS:  ACUTE APPENDICITIS  POST-OPERATIVE DIAGNOSIS:  ACUTE PERFORATED APPENDICITIS  PROCEDURE:  Procedure(s): APPENDECTOMY, LAPAROSCOPIC  SURGEON:  Surgeon(s): Sebastian Moles, MD  ASSISTANTS: none   ANESTHESIA:   local and general  EBL:  Total I/O In: 600 [I.V.:500; IV Piggyback:100] Out: 15 [Blood:15]  BLOOD ADMINISTERED:none  DRAINS: none   SPECIMEN:  Excision  DISPOSITION OF SPECIMEN:  PATHOLOGY  COUNTS:  YES  DICTATION: Iline Dictation CASE DATA: Type of patient?: emergency Status of Case? EMERGENT Add On Infection Present At Time Of Surgery (PATOS)?  PURULENCE Procedure detail: Informed consent was obtained.  She received intravenous antibiotics.  She was brought to the operating room and general endotracheal anesthesia was administered by the anesthesia staff.  Her abdomen was prepped and draped in a sterile fashion.  Timeout procedure was performed.The supraumbilical region was infiltrated with local. Supraumbilical incision was made. Subcutaneous tissues were dissected down revealing the anterior fascia. This was divided sharply along the midline. Peritoneal cavity was entered under direct vision without complication. A 0 Vicryl pursestring was placed around the fascial opening. Hassan trocar was inserted into the abdomen. The abdomen was insufflated with carbon dioxide in standard fashion. Under direct vision a 5 mm right abdomen port and a 12 mm medial right lower quadrant port were placed.  Port site selection was determined by some adhesions intra-abdominal.  Local was used at each port site.  Exploration revealed perforated appendicitis.  The mesoappendix was divided with the harmonic scalpel.  The base of the appendix was intact and it was divided with 2 firings of the automated Endo GIA with vascular load.  There was excellent staple line closure.  The appendix was placed in a  bag and removed from the abdomen.  It was sent to pathology.  The right lower quadrant was copiously irrigated.  There was excellent hemostasis.  The staple line remained intact.  Irrigation fluid was evacuated.  Ports were removed under direct vision.  Pneumoperitoneum was released.  The supraumbilical fascia was closed by tying the pursestring.  The fascia of the right lower quadrant port site was also closed with a figure-of-eight 0 Vicryl.  The 3 wounds were irrigated.  I used Bovie cautery to get hemostasis.  She had a lot of subcutaneous bleeding due to her Plavix .  Skin of each was then closed with 4-0 Vicryl followed by Dermabond.  All counts were correct.  She tolerated the procedure well without apparent complication was taken recovery in stable condition.  PATIENT DISPOSITION:  PACU - hemodynamically stable.   Delay start of Pharmacological VTE agent (>24hrs) due to surgical blood loss or risk of bleeding:  no  Moles Sebastian, MD, MPH, FACS Pager: [phone]  11/29/20259:00 PM

## 2024-05-18 NOTE — ED Provider Notes (Addendum)
 [place] EMERGENCY DEPARTMENT AT [facility] Provider Note  CSN: [id number] Arrival date & time: 05/18/24 1503  Chief Complaint(s) Abdominal Pain  HPI [name] is a 83 y.o. female history of hypertension, hyperlipidemia, coronary artery disease presenting to the emergency department with abdominal pain.  She reports abdominal pain beginning earlier this morning.  Reports pain is in the right lower abdomen.  Pain is constant.  Reports associated nausea and vomiting.  No diarrhea.  No urinary symptoms.  No chest pain or shortness of breath.  No fevers, reports some chills.   Past Medical History Past Medical History:  Diagnosis Date   Anxiety state, unspecified    CAD (coronary artery disease)    Disorder of bone and cartilage, unspecified    Diverticulosis of colon (without mention of hemorrhage)    Dizziness and giddiness    Fatty liver    Fibromyalgia    Obesity, unspecified    Other and unspecified hyperlipidemia    Other chronic nonalcoholic liver disease    Skin cancer    basal cell on face   Stroke [facility])    TIA   TIA (transient ischemic attack)    Unspecified essential hypertension    Patient Active Problem List   Diagnosis Date Noted   Acute appendicitis 05/18/2024   Rosacea 05/06/2024   Actinic keratosis 05/06/2024   Squamous cell carcinoma of skin of lower lip 05/06/2024   Impacted cerumen of right ear 11/19/2023   Chronic rhinitis 11/19/2023   Squamous cell carcinoma of right foot 10/31/2023   Chronic diastolic CHF (congestive heart failure) (HCC) 06/19/2023   Sensorineural hearing loss, bilateral 04/08/2023   Other specified disorders of eustachian tube, bilateral 04/08/2023   Intertriginous dermatitis associated with moisture 11/24/2022   B12 deficiency 11/24/2022   Hearing loss 06/27/2022   Mild persistent asthma with exacerbation 10/13/2021   Encounter for general adult medical examination with abnormal findings 06/02/2021   Bile  salt-induced diarrhea 09/03/2020   Statin intolerance 12/03/2019   Acquired hypothyroidism 12/02/2019   Loud snoring 12/02/2019   Allergic rhinitis due to pollen 06/21/2016   Simple chronic bronchitis (HCC) 06/21/2016   GAD (generalized anxiety disorder) 12/02/2014   IBS (irritable bowel syndrome) 01/20/2014   Vitamin D  deficiency 01/04/2013   HTN (hypertension) 07/26/2011   CAD (coronary artery disease) 07/26/2011   Nonalcoholic steatohepatitis (NASH) 06/20/2009   OBESITY 09/08/2007   OSTEOPENIA 09/07/2007   HLD (hyperlipidemia) 07/30/2007   Home Medication(s) Prior to Admission medications   Medication Sig Start Date End Date Taking? Authorizing Provider  ALPRAZolam  (XANAX ) 0.5 MG tablet Take 1 tablet (0.5 mg total) by mouth 3 (three) times daily as needed. for anxiety 05/06/24  Yes Joshua Debby CROME, MD  dorzolamide  (TRUSOPT ) 2 % ophthalmic solution Place 1 drop into both eyes 2 (two) times daily. 12/14/23  Yes [provider]  Evolocumab  (REPATHA  SURECLICK) 140 MG/ML SOAJ Inject 140 mg into the skin every 14 (fourteen) days. 01/08/24  Yes Conte, Tessa N, PA-C  SIMBRINZA 1-0.2 % SUSP Apply 1 drop to eye 2 (two) times daily. 12/29/23  Yes [provider]  acetaminophen  (TYLENOL ) 500 MG tablet Take 500 mg by mouth every 6 (six) hours as needed for moderate pain (pain score 4-6).    [provider]  aspirin  EC 81 MG tablet Take 1 tablet (81 mg total) by mouth daily. Swallow whole. 06/08/23   Raenelle Coria, MD  brimonidine  (ALPHAGAN  P) 0.1 % SOLN Place 1 drop into both eyes 4 (  four) times daily. 05/17/23   [provider]  clopidogrel  (PLAVIX ) 75 MG tablet Take 1 tablet (75 mg total) by mouth daily with breakfast. 01/08/24   Lucien Orren SAILOR, PA-C  dicyclomine  (BENTYL ) 10 MG capsule TAKE 1 CAPSULE BY MOUTH 4 TIMES DAILY. (BEFORE MEALS AND AT BEDTIME) 03/01/24   Joshua Debby CROME, MD  escitalopram  (LEXAPRO ) 20 MG tablet Take 1 tablet (20 mg total) by mouth at  bedtime. 05/06/24   Joshua Debby CROME, MD  famotidine  (PEPCID ) 20 MG tablet Take 1 tablet by mouth twice daily 01/08/24   Alvia Corean CROME, FNP  furosemide  (LASIX ) 20 MG tablet Take 1 tablet (20 mg total) by mouth every morning. 01/08/24   Lucien Orren SAILOR, PA-C  isosorbide  mononitrate (IMDUR ) 30 MG 24 hr tablet Take 1 tablet (30 mg total) by mouth daily. 01/08/24 05/06/24  Conte, Tessa N, PA-C  levothyroxine  (SYNTHROID ) 25 MCG tablet Take 1 tablet (25 mcg total) by mouth daily before breakfast. 05/06/24   Joshua Debby CROME, MD  nystatin  (MYCOSTATIN /NYSTOP ) powder Apply 1 Application topically 3 (three) times daily. Patient taking differently: Apply 1 Application topically daily as needed (dry skin). 11/24/22   Elnor Lauraine BRAVO, NP  potassium chloride  SA (KLOR-CON  M) 20 MEQ tablet Take 1 tablet by mouth twice daily 01/08/24   Conte, Tessa N, PA-C  VASCEPA  1 g capsule TAKE 2 CAPSULES BY MOUTH IN THE MORNING AND 2 AT BEDTIME 01/08/24   Lucien Orren SAILOR, [place]                                                                                                                                    Past Surgical History Past Surgical History:  Procedure Laterality Date   BACK SURGERY     CHOLECYSTECTOMY     CORONARY STENT INTERVENTION N/A 06/06/2023   Procedure: CORONARY STENT INTERVENTION;  Surgeon: Jordan, Peter M, MD;  Location: [facility] INVASIVE CV LAB;  Service: Cardiovascular;  Laterality: N/A;   LEFT HEART CATH AND CORONARY ANGIOGRAPHY N/A 06/06/2023   Procedure: LEFT HEART CATH AND CORONARY ANGIOGRAPHY;  Surgeon: Jordan, Peter M, MD;  Location: [facility] INVASIVE CV LAB;  Service: Cardiovascular;  Laterality: N/A;   MOHS SURGERY     TONSILLECTOMY AND ADENOIDECTOMY     TOTAL ABDOMINAL HYSTERECTOMY W/ BILATERAL SALPINGOOPHORECTOMY     Family History Family History  Problem Relation Age of Onset   Stomach cancer Mother    Cancer Brother    Breast cancer Sister    Other Father    Breast cancer Sister    Colon cancer Neg  Hx     Social History Social History   Tobacco Use   Smoking status: Never   Smokeless tobacco: Never  Vaping Use   Vaping status: Never Used  Substance Use Topics   Alcohol use: No   Drug use: No   Allergies Diltiazem hcl, Statins, Zetia  [ezetimibe ], Codeine, Sulfonamide derivatives, Tape,  Protonix  [pantoprazole ], and Tramadol   Review of Systems Review of Systems  All other systems reviewed and are negative.   Physical Exam Vital Signs  I have reviewed the triage vital signs BP 105/77 (BP Location: Right Arm)   Pulse 73   Temp 99.3 F (37.4 C) (Oral)   Resp 18   SpO2 100%  Physical Exam Vitals and nursing note reviewed.  Constitutional:      General: She is not in acute distress.    Appearance: She is well-developed.  HENT:     Head: Normocephalic and atraumatic.     Mouth/Throat:     Mouth: Mucous membranes are moist.  Eyes:     Pupils: Pupils are equal, round, and reactive to light.  Cardiovascular:     Rate and Rhythm: Normal rate and regular rhythm.     Heart sounds: No murmur heard. Pulmonary:     Effort: Pulmonary effort is normal. No respiratory distress.     Breath sounds: Normal breath sounds.  Abdominal:     General: Abdomen is flat.     Palpations: Abdomen is soft.     Tenderness: There is abdominal tenderness in the right lower quadrant. There is guarding and rebound.  Musculoskeletal:        General: No tenderness.     Right lower leg: No edema.     Left lower leg: No edema.  Skin:    General: Skin is warm and dry.  Neurological:     General: No focal deficit present.     Mental Status: She is alert. Mental status is at baseline.  Psychiatric:        Mood and Affect: Mood normal.        Behavior: Behavior normal.     ED Results and Treatments Labs (all labs ordered are listed, but only abnormal results are displayed) Labs Reviewed  COMPREHENSIVE METABOLIC PANEL WITH GFR - Abnormal; Notable for the following components:      Result  Value   Potassium 3.1 (*)    Glucose, Bld 107 (*)    Calcium  8.3 (*)    Total Protein 5.7 (*)    Albumin 3.4 (*)    All other components within normal limits  CBC WITH DIFFERENTIAL/PLATELET - Abnormal; Notable for the following components:   Platelets 118 (*)    All other components within normal limits  I-STAT CHEM 8, ED - Abnormal; Notable for the following components:   Potassium 3.1 (*)    Glucose, Bld 103 (*)    Calcium , Ion 1.12 (*)    All other components within normal limits  LIPASE, BLOOD                                                                                                                          Radiology CT ABDOMEN PELVIS W CONTRAST Result Date: 05/18/2024 CLINICAL DATA:  Abdominal pain, nausea, and vomiting EXAM: CT ABDOMEN AND PELVIS WITH CONTRAST TECHNIQUE: Multidetector CT imaging of  the abdomen and pelvis was performed using the standard protocol following bolus administration of intravenous contrast. RADIATION DOSE REDUCTION: This exam was performed according to the departmental dose-optimization program which includes automated exposure control, adjustment of the mA and/or kV according to patient size and/or use of iterative reconstruction technique. CONTRAST:  75mL OMNIPAQUE  IOHEXOL  350 MG/ML SOLN COMPARISON:  CT abdomen and pelvis dated 03/29/2023 FINDINGS: Lower chest: No focal consolidation or pulmonary nodule in the lung bases. No pleural effusion or pneumothorax demonstrated. Partially imaged heart size is normal. Coronary artery calcifications. Hepatobiliary: Mildly lobulated hepatic contour. Unchanged common bile duct dilation measuring 1.6 cm. No radiopaque stones. Cholecystectomy. Pancreas: No focal lesions or main ductal dilation. Spleen: Normal in size without focal abnormality. Adrenals/Urinary Tract: No adrenal nodules. Unchanged 1.0 x 1.0 cm posterior left lower renal enhancing lesion (3:39). No hydronephrosis or calculi. No focal bladder wall  thickening. Stomach/Bowel: Small hiatal hernia. No evidence of bowel wall thickening, distention, or inflammatory changes. Colonic diverticulosis without acute diverticulitis. Dilated appendix measuring 12 mm demonstrates mural thickening and periappendiceal stranding. Vascular/Lymphatic: Aortic atherosclerosis. No enlarged abdominal or pelvic lymph nodes. Reproductive: No adnexal masses. Other: Small volume right lower quadrant and pelvic free fluid, which appears mildly hyperattenuating. Musculoskeletal: No acute or abnormal lytic or blastic osseous lesions. Multilevel degenerative changes of the partially imaged thoracic and lumbar spine. IMPRESSION: 1. Acute appendicitis with small volume right lower quadrant and pelvic free fluid, which appears mildly hyperattenuating, which may represent early perforation. No drainable fluid collection. 2. Unchanged 1.0 cm posterior left lower renal enhancing lesion, suspicious for renal cell carcinoma. 3. Unchanged common bile duct dilation measuring 1.6 cm, likely related to postcholecystectomy state. 4.  Aortic Atherosclerosis (ICD10-I70.0). Electronically Signed   By: Limin  Xu M.D.   On: 05/18/2024 18:23    Pertinent labs & imaging results that were available during my care of the patient were reviewed by me and considered in my medical decision making (see MDM for details).  Medications Ordered in ED Medications  metroNIDAZOLE  (FLAGYL ) IVPB 500 mg (has no administration in time range)  sodium chloride  0.9 % bolus 500 mL (has no administration in time range)  potassium chloride  10 mEq in 100 mL IVPB (has no administration in time range)  sodium chloride  0.9 % bolus 1,000 mL (0 mLs Intravenous Stopped 05/18/24 1758)  HYDROmorphone  (DILAUDID ) injection 0.5 mg (0.5 mg Intravenous Given 05/18/24 1546)  iohexol  (OMNIPAQUE ) 350 MG/ML injection 75 mL (75 mLs Intravenous Contrast Given 05/18/24 1746)  HYDROmorphone  (DILAUDID ) injection 0.5 mg (0.5 mg Intravenous  Given 05/18/24 1808)  cefTRIAXone (ROCEPHIN) 2 g in sodium chloride  0.9 % 100 mL IVPB (0 g Intravenous Stopped 05/18/24 1929)                                                                                                                                     Procedures Procedures  (including critical care time)  Medical  Decision Making / ED Course   MDM:  83 year old presenting to the emergency department abdominal pain.  Patient overall well-appearing, but does have lower abdominal tenderness on exam.  Differential includes appendicitis, diverticulitis, abscess, perforation, volvulus, obstruction, biliary process although patient reports prior cholecystectomy.  Will check testing including CBC, CMP, urinalysis, CT abdomen and reassess.  Clinical Course as of 05/18/24 1947  Sat May 18, 2024  1932 CT scan shows appendicitis with possible early perforation. Also shows stable L renal nodule possible indolent carcinoma. This is known and pt follows with urology for this. D/w Dr. Sebastian from surgery who will admit. Request medical admit given age. D/w Dr. Macario with medicine who will admit.  [WS]    Clinical Course User Index [WS] Francesca Elsie CROME, MD     Additional history obtained: -Additional history obtained from family and ems -External records from outside source obtained and reviewed including: Chart review including previous notes, labs, imaging, consultation notes including prior notes    Lab Tests: -I ordered, reviewed, and interpreted labs.   The pertinent results include:   Labs Reviewed  COMPREHENSIVE METABOLIC PANEL WITH GFR - Abnormal; Notable for the following components:      Result Value   Potassium 3.1 (*)    Glucose, Bld 107 (*)    Calcium  8.3 (*)    Total Protein 5.7 (*)    Albumin 3.4 (*)    All other components within normal limits  CBC WITH DIFFERENTIAL/PLATELET - Abnormal; Notable for the following components:   Platelets 118 (*)    All other  components within normal limits  I-STAT CHEM 8, ED - Abnormal; Notable for the following components:   Potassium 3.1 (*)    Glucose, Bld 103 (*)    Calcium , Ion 1.12 (*)    All other components within normal limits  LIPASE, BLOOD    Notable for mild hypokalemia  EKG   EKG Interpretation Date/Time:  Saturday May 18 2024 15:25:33 EST Ventricular Rate:  71 PR Interval:  182 QRS Duration:  105 QT Interval:  468 QTC Calculation: 509 R Axis:   -42  Text Interpretation: Sinus rhythm Inferior infarct, old Consider anterior infarct Prolonged QT interval Confirmed by Francesca Elsie ([id number]) on 05/18/2024 3:31:41 PM         Imaging Studies ordered: I ordered imaging studies including CT scan On my interpretation imaging demonstrates appendicitis  I independently visualized and interpreted imaging. I agree with the radiologist interpretation   Medicines ordered and prescription drug management: Meds ordered this encounter  Medications   sodium chloride  0.9 % bolus 1,000 mL   HYDROmorphone  (DILAUDID ) injection 0.5 mg   iohexol  (OMNIPAQUE ) 350 MG/ML injection 75 mL   HYDROmorphone  (DILAUDID ) injection 0.5 mg   cefTRIAXone (ROCEPHIN) 2 g in sodium chloride  0.9 % 100 mL IVPB    Antibiotic Indication::   Intra-abdominal   metroNIDAZOLE  (FLAGYL ) IVPB 500 mg    Antibiotic Indication::   Intra-abdominal Infection   sodium chloride  0.9 % bolus 500 mL   potassium chloride  10 mEq in 100 mL IVPB    -I have reviewed the patients home medicines and have made adjustments as needed   Consultations Obtained: I requested consultation with the general surgeon,  and discussed lab and imaging findings as well as pertinent plan - they recommend: admission   Cardiac Monitoring: The patient was maintained on a cardiac monitor.  I personally viewed and interpreted the cardiac monitored which showed an underlying rhythm of: NSR  Social Determinants  of Health:  Diagnosis or treatment  significantly limited by social determinants of health: obesity   Reevaluation: After the interventions noted above, I reevaluated the patient and found that their symptoms have improved  Co morbidities that complicate the patient evaluation  Past Medical History:  Diagnosis Date   Anxiety state, unspecified    CAD (coronary artery disease)    Disorder of bone and cartilage, unspecified    Diverticulosis of colon (without mention of hemorrhage)    Dizziness and giddiness    Fatty liver    Fibromyalgia    Obesity, unspecified    Other and unspecified hyperlipidemia    Other chronic nonalcoholic liver disease    Skin cancer    basal cell on face   Stroke (HCC)    TIA   TIA (transient ischemic attack)    Unspecified essential hypertension       Dispostion: Disposition decision including need for hospitalization was considered, and patient admitted to the hospital.    Final Clinical Impression(s) / ED Diagnoses Final diagnoses:  Acute appendicitis with localized peritonitis, without gangrene or abscess, unspecified whether perforation present     This chart was dictated using voice recognition software.  Despite best efforts to proofread,  errors can occur which can change the documentation meaning.    Francesca Elsie CROME, MD 05/18/24 1934    Francesca Elsie CROME, MD 05/18/24 [phone]

## 2024-05-18 NOTE — Progress Notes (Signed)
 Post op check  Went to [phone] to check in on patient now that she is out of Post-op observation. She is laying in bed comfortably with her 3 daughters at bedside. Ms. Consalvo reports she feels 100% better than she did before surgery. No complaints right now. Says she can move around better without the abdominal pain. Incisions checked and noted to be hemostatic. She reports she does not need anything at the moment.   Pertinent orders on file:  - regular diet - ceftriaxone  Q24 - morphine  Q3 PRN for severe pain - oxyIR Q4 PRN for moderate pain  I've added bowel regimen  Dorothyann Helling, MD 05/19/24  12:00 AM

## 2024-05-18 NOTE — Consult Note (Signed)
 Reason for Consult:appendicitis Referring Physician: Elsie Body  [name] is an 83 y.o. female.  HPI: 83yo F with MMP as below developed right lower quadrant abdominal pain at 3 AM.  She had associated nausea and vomiting.  The pain persisted and she came to the emergency department.  CT scan shows acute appendicitis with possible perforation.  I was consulted for medical management.   Past Medical History:  Diagnosis Date   Anxiety state, unspecified    CAD (coronary artery disease)    Disorder of bone and cartilage, unspecified    Diverticulosis of colon (without mention of hemorrhage)    Dizziness and giddiness    Fatty liver    Fibromyalgia    Obesity, unspecified    Other and unspecified hyperlipidemia    Other chronic nonalcoholic liver disease    Skin cancer    basal cell on face   Stroke (HCC)    TIA   TIA (transient ischemic attack)    Unspecified essential hypertension     Past Surgical History:  Procedure Laterality Date   BACK SURGERY     CHOLECYSTECTOMY     CORONARY STENT INTERVENTION N/A 06/06/2023   Procedure: CORONARY STENT INTERVENTION;  Surgeon: Jordan, Peter M, MD;  Location: [facility] INVASIVE [facility];  Service: Cardiovascular;  Laterality: N/A;   LEFT HEART CATH AND CORONARY ANGIOGRAPHY N/A 06/06/2023   Procedure: LEFT HEART CATH AND CORONARY ANGIOGRAPHY;  Surgeon: Jordan, Peter M, MD;  Location: [facility] INVASIVE CV LAB;  Service: Cardiovascular;  Laterality: N/A;   MOHS SURGERY     TONSILLECTOMY AND ADENOIDECTOMY     TOTAL ABDOMINAL HYSTERECTOMY W/ BILATERAL SALPINGOOPHORECTOMY      Family History  Problem Relation Age of Onset   Stomach cancer Mother    Cancer Brother    Breast cancer Sister    Other Father    Breast cancer Sister    Colon cancer Neg Hx     Social History:  reports that she has never smoked. She has never used smokeless tobacco. She reports that she does not drink alcohol and does not use drugs.  Allergies:  Allergies   Allergen Reactions   Diltiazem Hcl Other (See Comments)    REACTION: pt states it made her dizzy...   Statins     Muscle aches   Zetia  [Ezetimibe ]     it made me feel bad   Codeine Nausea Only   Sulfonamide Derivatives Hives, Itching and Rash   Tape Itching   Protonix  [Pantoprazole ] Nausea Only   Tramadol  Itching    Medications: I have reviewed the patient's current medications.  Results for orders placed or performed during the hospital encounter of 05/18/24 (from the past 48 hours)  Comprehensive metabolic panel     Status: Abnormal   Collection Time: 05/18/24  3:48 PM  Result Value Ref Range   Sodium 139 135 - 145 mmol/L   Potassium 3.1 (L) 3.5 - 5.1 mmol/L   Chloride 108 98 - 111 mmol/L   CO2 23 22 - 32 mmol/L   Glucose, Bld 107 (H) 70 - 99 mg/dL    Comment: Glucose reference range applies only to samples taken after fasting for at least 8 hours.   BUN 11 8 - 23 mg/dL   Creatinine, Ser 9.24 0.44 - 1.00 mg/dL   Calcium  8.3 (L) 8.9 - 10.3 mg/dL   Total Protein 5.7 (L) 6.5 - 8.1 g/dL   Albumin 3.4 (L) 3.5 - 5.0 g/dL   AST 38  15 - 41 U/L   ALT 26 0 - 44 U/L   Alkaline Phosphatase 86 38 - 126 U/L   Total Bilirubin 1.2 0.0 - 1.2 mg/dL   GFR, Estimated >39 >39 mL/min    Comment: (NOTE) Calculated using the CKD-EPI Creatinine Equation (2021)    Anion gap 8 5 - 15    Comment: Performed at [facility], [address], [place], [place] [address]  CBC with Differential     Status: Abnormal   Collection Time: 05/18/24  3:48 PM  Result Value Ref Range   WBC 8.7 4.0 - 10.5 K/uL   RBC 4.35 3.87 - 5.11 MIL/uL   Hemoglobin 12.7 12.0 - 15.0 g/dL   HCT 61.7 63.9 - 53.9 %   MCV 87.8 80.0 - 100.0 fL   MCH 29.2 26.0 - 34.0 pg   MCHC 33.2 30.0 - 36.0 g/dL   RDW 86.7 88.4 - 84.4 %   Platelets 118 (L) 150 - 400 K/uL    Comment: REPEATED TO VERIFY   nRBC 0.0 0.0 - 0.2 %   Neutrophils Relative % 86 %   Neutro Abs 7.4 1.7 - 7.7 K/uL   Lymphocytes Relative 8 %   Lymphs Abs  0.7 0.7 - 4.0 K/uL   Monocytes Relative 6 %   Monocytes Absolute 0.6 0.1 - 1.0 K/uL   Eosinophils Relative 0 %   Eosinophils Absolute 0.0 0.0 - 0.5 K/uL   Basophils Relative 0 %   Basophils Absolute 0.0 0.0 - 0.1 K/uL   Immature Granulocytes 0 %   Abs Immature Granulocytes 0.03 0.00 - 0.07 K/uL    Comment: Performed at [facility], [address], [place], [place] [address]  Lipase, blood     Status: None   Collection Time: 05/18/24  3:48 PM  Result Value Ref Range   Lipase 23 11 - 51 U/L    Comment: Performed at [facility], [address], [place], [place] [address]  I-stat chem 8, ED     Status: Abnormal   Collection Time: 05/18/24  3:55 PM  Result Value Ref Range   Sodium 140 135 - 145 mmol/L   Potassium 3.1 (L) 3.5 - 5.1 mmol/L   Chloride 105 98 - 111 mmol/L   BUN 12 8 - 23 mg/dL   Creatinine, Ser 9.19 0.44 - 1.00 mg/dL   Glucose, Bld 896 (H) 70 - 99 mg/dL    Comment: Glucose reference range applies only to samples taken after fasting for at least 8 hours.   Calcium , Ion 1.12 (L) 1.15 - 1.40 mmol/L   TCO2 22 22 - 32 mmol/L   Hemoglobin 12.6 12.0 - 15.0 g/dL   HCT 62.9 63.9 - 53.9 %    CT ABDOMEN PELVIS W CONTRAST Result Date: 05/18/2024 CLINICAL DATA:  Abdominal pain, nausea, and vomiting EXAM: CT ABDOMEN AND PELVIS WITH CONTRAST TECHNIQUE: Multidetector CT imaging of the abdomen and pelvis was performed using the standard protocol following bolus administration of intravenous contrast. RADIATION DOSE REDUCTION: This exam was performed according to the departmental dose-optimization program which includes automated exposure control, adjustment of the mA and/or kV according to patient size and/or use of iterative reconstruction technique. CONTRAST:  75mL OMNIPAQUE  IOHEXOL  350 MG/ML SOLN COMPARISON:  CT abdomen and pelvis dated 03/29/2023 FINDINGS: Lower chest: No focal consolidation or pulmonary nodule in the lung bases. No pleural effusion or pneumothorax  demonstrated. Partially imaged heart size is normal. Coronary artery calcifications. Hepatobiliary: Mildly  lobulated hepatic contour. Unchanged common bile duct dilation measuring 1.6 cm. No radiopaque stones. Cholecystectomy. Pancreas: No focal lesions or main ductal dilation. Spleen: Normal in size without focal abnormality. Adrenals/Urinary Tract: No adrenal nodules. Unchanged 1.0 x 1.0 cm posterior left lower renal enhancing lesion (3:39). No hydronephrosis or calculi. No focal bladder wall thickening. Stomach/Bowel: Small hiatal hernia. No evidence of bowel wall thickening, distention, or inflammatory changes. Colonic diverticulosis without acute diverticulitis. Dilated appendix measuring 12 mm demonstrates mural thickening and periappendiceal stranding. Vascular/Lymphatic: Aortic atherosclerosis. No enlarged abdominal or pelvic lymph nodes. Reproductive: No adnexal masses. Other: Small volume right lower quadrant and pelvic free fluid, which appears mildly hyperattenuating. Musculoskeletal: No acute or abnormal lytic or blastic osseous lesions. Multilevel degenerative changes of the partially imaged thoracic and lumbar spine. IMPRESSION: 1. Acute appendicitis with small volume right lower quadrant and pelvic free fluid, which appears mildly hyperattenuating, which may represent early perforation. No drainable fluid collection. 2. Unchanged 1.0 cm posterior left lower renal enhancing lesion, suspicious for renal cell carcinoma. 3. Unchanged common bile duct dilation measuring 1.6 cm, likely related to postcholecystectomy state. 4.  Aortic Atherosclerosis (ICD10-I70.0). Electronically Signed   By: Limin  Xu M.D.   On: 05/18/2024 18:23    Review of Systems  Constitutional:  Positive for appetite change.  HENT: Negative.    Eyes: Negative.   Respiratory: Negative.    Cardiovascular:  Negative for chest pain.  Gastrointestinal:  Positive for abdominal pain, diarrhea, nausea and vomiting.  Endocrine:  Negative.   Genitourinary: Negative.   Musculoskeletal: Negative.   Allergic/Immunologic: Negative.   Neurological: Negative.   Hematological: Negative.   Psychiatric/Behavioral: Negative.     Blood pressure 105/77, pulse 73, temperature 99.3 F (37.4 C), temperature source Oral, resp. rate 18, SpO2 100%. Physical Exam HENT:     Head: Normocephalic.  Cardiovascular:     Rate and Rhythm: Normal rate and regular rhythm.  Pulmonary:     Effort: Pulmonary effort is normal.     Breath sounds: No wheezing.  Abdominal:     General: There is no distension.     Tenderness: There is abdominal tenderness in the right lower quadrant. There is guarding. There is no rebound.  Skin:    General: Skin is warm.     Capillary Refill: Capillary refill takes 2 to 3 seconds.  Neurological:     Mental Status: She is alert and oriented to person, place, and time.  Psychiatric:        Mood and Affect: Mood normal.     Assessment/Plan: Acute appendicitis -IV Rocephin and Flagyl .  Will proceed to the OR for laparoscopic appendectomy.  I discussed the procedure, risks, and benefits with her and her daughter.  I answered her questions.  She is agreeable.  In light of multiple medical problems, EDP has asked the hospitalist to admit and I appreciate this.  Dann FORBES Hummer 05/18/2024, 7:24 PM

## 2024-05-18 NOTE — ED Triage Notes (Addendum)
 Bib EMS from h ome c/o abdominal pain x12 hours with nausea/vomiting. 500 mL NS, 4 zofran , and 100 mcg fentanyl  given en route. Patient alert and oriented with 6/10 abdominal pain on arrival. Denies nausea now. Denies chest pain. H/o cholecystectomy.

## 2024-05-18 NOTE — Anesthesia Procedure Notes (Signed)
 Procedure Name: Intubation Date/Time: 05/18/2024 8:16 PM  Performed by: Vaughn Zebedee HERO, CRNAPre-anesthesia Checklist: Patient identified, Emergency Drugs available, Suction available and Patient being monitored Patient Re-evaluated:Patient Re-evaluated prior to induction Oxygen Delivery Method: Circle system utilized Preoxygenation: Pre-oxygenation with 100% oxygen Induction Type: IV induction Ventilation: Mask ventilation without difficulty Laryngoscope Size: Miller and 2 Grade View: Grade I Tube type: Oral Tube size: 7.5 mm Number of attempts: 1 Airway Equipment and Method: Stylet Placement Confirmation: ETT inserted through vocal cords under direct vision, positive ETCO2 and breath sounds checked- equal and bilateral Secured at: 21 cm Tube secured with: Tape Dental Injury: Teeth and Oropharynx as per pre-operative assessment

## 2024-05-18 NOTE — H&P (Signed)
 History and Physical    [name] FMW:[id number] DOB: 1940/12/23 DOA: 05/18/2024  PCP: Joshua Debby CROME, MD  Outpatient Specialists:  Pulmonology: Dr. Donnice Beals at [facility] pulmonology Cardiology: Dr. Lurena Red at [facility] heart care ENT: Dr. Daniel Moccasin at [facility] ENT specialists Patient coming from: home  Chief Complaint: abdominal pain, nausea, and vomiting  HPI: [name] is a 83 y.o. female with medical history significant of CHF, CAD, GAD, hypothyroidism, B12 deficiency, HTN, IBS, asthma, NASH, osteopenia, squamous cell carcinoma presents with 12 hours of abdominal pain, nausea, and vomiting.  ED Course: Patient brought into ED from home by EMS; was given half liter NS bolus, 4 mg Zofran , 100 mcg fentanyl  and route.  ED labs remarkable for mild hypokalemia, platelets 118; otherwise remarkable.  CT abdomen pelvis found acute appendicitis with small volume RLQ and pelvic free fluid. Ms. Rammel was taken for emergency lap appendectomy around 8 pm.  Medications  metroNIDAZOLE  (FLAGYL ) IVPB 500 mg (has no administration in time range)  sodium chloride  0.9 % bolus 500 mL (has no administration in time range)  sodium chloride  0.9 % bolus 1,000 mL (0 mLs Intravenous Stopped 05/18/24 1758)  HYDROmorphone  (DILAUDID ) injection 0.5 mg (0.5 mg Intravenous Given 05/18/24 1546)  iohexol  (OMNIPAQUE ) 350 MG/ML injection 75 mL (75 mLs Intravenous Contrast Given 05/18/24 1746)  HYDROmorphone  (DILAUDID ) injection 0.5 mg (0.5 mg Intravenous Given 05/18/24 1808)  cefTRIAXone (ROCEPHIN) 2 g in sodium chloride  0.9 % 100 mL IVPB (2 g Intravenous New Bag/Given 05/18/24 1843)   Labs Reviewed  COMPREHENSIVE METABOLIC PANEL WITH GFR - Abnormal; Notable for the following components:      Result Value   Potassium 3.1 (*)    Glucose, Bld 107 (*)    Calcium  8.3 (*)    Total Protein 5.7 (*)    Albumin 3.4 (*)    All other components within normal limits  CBC WITH DIFFERENTIAL/PLATELET - Abnormal;  Notable for the following components:   Platelets 118 (*)    All other components within normal limits  I-STAT CHEM 8, ED - Abnormal; Notable for the following components:   Potassium 3.1 (*)    Glucose, Bld 103 (*)    Calcium , Ion 1.12 (*)    All other components within normal limits  LIPASE, BLOOD   Review of Systems: As per HPI otherwise 10 point review of systems negative.   Past Medical History:  Diagnosis Date   Anxiety state, unspecified    CAD (coronary artery disease)    Disorder of bone and cartilage, unspecified    Diverticulosis of colon (without mention of hemorrhage)    Dizziness and giddiness    Fatty liver    Fibromyalgia    Obesity, unspecified    Other and unspecified hyperlipidemia    Other chronic nonalcoholic liver disease    Skin cancer    basal cell on face   Stroke (HCC)    TIA   TIA (transient ischemic attack)    Unspecified essential hypertension    Past Surgical History:  Procedure Laterality Date   BACK SURGERY     CHOLECYSTECTOMY     CORONARY STENT INTERVENTION N/A 06/06/2023   Procedure: CORONARY STENT INTERVENTION;  Surgeon: Jordan, Peter M, MD;  Location: [facility] CV [facility];  Service: Cardiovascular;  Laterality: N/A;   LEFT HEART CATH AND CORONARY ANGIOGRAPHY N/A 06/06/2023   Procedure: LEFT HEART CATH AND CORONARY ANGIOGRAPHY;  Surgeon: Jordan, Peter M, MD;  Location[facility] INVASIVE CV LAB;  Service: Cardiovascular;  Laterality: N/A;   MOHS SURGERY     TONSILLECTOMY AND ADENOIDECTOMY     TOTAL ABDOMINAL HYSTERECTOMY W/ BILATERAL SALPINGOOPHORECTOMY      reports that she has never smoked. She has never used smokeless tobacco. She reports that she does not drink alcohol and does not use drugs.  Allergies  Allergen Reactions   Diltiazem Hcl Other (See Comments)    REACTION: pt states it made her dizzy...   Statins     Muscle aches   Zetia  [Ezetimibe ]     it made me feel bad   Codeine Nausea Only   Sulfonamide Derivatives Hives,  Itching and Rash   Tape Itching   Protonix  [Pantoprazole ] Nausea Only   Tramadol  Itching   Family History  Problem Relation Age of Onset   Stomach cancer Mother    Cancer Brother    Breast cancer Sister    Other Father    Breast cancer Sister    Colon cancer Neg Hx    Prior to Admission medications   Medication Sig Start Date End Date Taking? Authorizing Provider  acetaminophen  (TYLENOL ) 500 MG tablet Take 500 mg by mouth every 6 (six) hours as needed for moderate pain (pain score 4-6).    [provider]  ALPRAZolam  (XANAX ) 0.5 MG tablet Take 1 tablet (0.5 mg total) by mouth 3 (three) times daily as needed. for anxiety 05/06/24   Joshua Debby CROME, MD  aspirin  EC 81 MG tablet Take 1 tablet (81 mg total) by mouth daily. Swallow whole. 06/08/23   Raenelle Coria, MD  brimonidine  (ALPHAGAN  P) 0.1 % SOLN Place 1 drop into both eyes 4 (four) times daily. 05/17/23   [provider]  clopidogrel  (PLAVIX ) 75 MG tablet Take 1 tablet (75 mg total) by mouth daily with breakfast. 01/08/24   Lucien Orren SAILOR, PA-C  dicyclomine  (BENTYL ) 10 MG capsule TAKE 1 CAPSULE BY MOUTH 4 TIMES DAILY. (BEFORE MEALS AND AT BEDTIME) 03/01/24   Joshua Debby CROME, MD  dorzolamide  (TRUSOPT ) 2 % ophthalmic solution Place 1 drop into both eyes 2 (two) times daily. 12/14/23   [provider]  escitalopram  (LEXAPRO ) 20 MG tablet Take 1 tablet (20 mg total) by mouth at bedtime. 05/06/24   Joshua Debby CROME, MD  Evolocumab  (REPATHA  SURECLICK) 140 MG/ML SOAJ Inject 140 mg into the skin every 14 (fourteen) days. 01/08/24   Conte, Tessa N, PA-C  famotidine  (PEPCID ) 20 MG tablet Take 1 tablet by mouth twice daily 01/08/24   Alvia Corean CROME, FNP  furosemide  (LASIX ) 20 MG tablet Take 1 tablet (20 mg total) by mouth every morning. 01/08/24   Lucien Orren SAILOR, PA-C  isosorbide  mononitrate (IMDUR ) 30 MG 24 hr tablet Take 1 tablet (30 mg total) by mouth daily. 01/08/24 05/06/24  Lucien Orren SAILOR, PA-C  levothyroxine   (SYNTHROID ) 25 MCG tablet Take 1 tablet (25 mcg total) by mouth daily before breakfast. 05/06/24   Joshua Debby CROME, MD  nystatin  (MYCOSTATIN /NYSTOP ) powder Apply 1 Application topically 3 (three) times daily. Patient taking differently: Apply 1 Application topically daily as needed (dry skin). 11/24/22   Elnor Lauraine BRAVO, NP  potassium chloride  SA (KLOR-CON  M) 20 MEQ tablet Take 1 tablet by mouth twice daily 01/08/24   Conte, Tessa N, PA-C  SIMBRINZA 1-0.2 % SUSP Apply 1 drop to eye 2 (two) times daily. 12/29/23   [provider]  VASCEPA  1 g capsule TAKE 2 CAPSULES BY MOUTH IN THE MORNING AND 2 AT BEDTIME 01/08/24  Lucien Orren SAILOR, [place]   Physical Exam: Vitals:   05/18/24 1509  BP: 105/77  Pulse: 73  Resp: 18  Temp: 99.3 F (37.4 C)  TempSrc: Oral  SpO2: 100%   Constitutional: NAD, calm, uncomfortable appearing Eyes: PERRL, lids and conjunctivae normal ENMT: Mallampati 4.  Mucous membranes are moist. Posterior pharynx clear of any exudate or lesions.Normal dentition.  Neck: normal, supple, no masses, no thyromegaly Respiratory: clear to auscultation bilaterally, no wheezing, no crackles. Normal respiratory effort. No accessory muscle use.  Cardiovascular: Regular rate and rhythm, no murmurs / rubs / gallops. No extremity edema. 2+ pedal pulses. No carotid bruits.  Abdomen: Global TTP with rebound tenderness, worst in RLQ. Bowel sounds positive.  Musculoskeletal: no clubbing / cyanosis. No joint deformity upper and lower extremities. Good ROM, no contractures. Normal muscle tone.  Skin: no rashes, lesions, ulcers. No induration Neurologic: CN 2-12 grossly intact. Sensation intact Psychiatric: Normal judgment and insight. Alert and oriented x 3. Normal mood.   Labs on Admission: I have personally reviewed following labs and imaging studies  CBC: Recent Labs  Lab 05/18/24 1548 05/18/24 1555  WBC 8.7  --   NEUTROABS 7.4  --   HGB 12.7 12.6  HCT 38.2 37.0  MCV 87.8  --   PLT  118*  --    Basic Metabolic Panel: Recent Labs  Lab 05/18/24 1548 05/18/24 1555  NA 139 140  K 3.1* 3.1*  CL 108 105  CO2 23  --   GLUCOSE 107* 103*  BUN 11 12  CREATININE 0.75 0.80  CALCIUM  8.3*  --    GFR: Estimated Creatinine Clearance: 54 mL/min (by C-G formula based on SCr of 0.8 mg/dL).  Liver Function Tests: Recent Labs  Lab 05/18/24 1548  AST 38  ALT 26  ALKPHOS 86  BILITOT 1.2  PROT 5.7*  ALBUMIN 3.4*   Recent Labs  Lab 05/18/24 1548  LIPASE 23   BNP (last 3 results) Recent Labs    07/05/23 1112  PROBNP 244.0*   Urine analysis:    Component Value Date/Time   COLORURINE YELLOW 01/25/2021 1129   APPEARANCEUR CLEAR 01/25/2021 1129   LABSPEC 1.015 01/25/2021 1129   PHURINE 6.0 01/25/2021 1129   GLUCOSEU NEGATIVE 01/25/2021 1129   HGBUR NEGATIVE 01/25/2021 1129   BILIRUBINUR NEGATIVE 01/25/2021 1129   BILIRUBINUR Negative 10/08/2019 1211   KETONESUR NEGATIVE 01/25/2021 1129   PROTEINUR Negative 10/08/2019 1211   PROTEINUR NEGATIVE 07/26/2011 0112   UROBILINOGEN 0.2 01/25/2021 1129   NITRITE NEGATIVE 01/25/2021 1129   LEUKOCYTESUR NEGATIVE 01/25/2021 1129   Radiological Exams on Admission: CT ABDOMEN PELVIS W CONTRAST Result Date: 05/18/2024 CLINICAL DATA:  Abdominal pain, nausea, and vomiting EXAM: CT ABDOMEN AND PELVIS WITH CONTRAST TECHNIQUE: Multidetector CT imaging of the abdomen and pelvis was performed using the standard protocol following bolus administration of intravenous contrast. RADIATION DOSE REDUCTION: This exam was performed according to the departmental dose-optimization program which includes automated exposure control, adjustment of the mA and/or kV according to patient size and/or use of iterative reconstruction technique. CONTRAST:  75mL OMNIPAQUE  IOHEXOL  350 MG/ML SOLN COMPARISON:  CT abdomen and pelvis dated 03/29/2023 FINDINGS: Lower chest: No focal consolidation or pulmonary nodule in the lung bases. No pleural effusion or  pneumothorax demonstrated. Partially imaged heart size is normal. Coronary artery calcifications. Hepatobiliary: Mildly lobulated hepatic contour. Unchanged common bile duct dilation measuring 1.6 cm. No radiopaque stones. Cholecystectomy. Pancreas: No focal lesions or main ductal dilation. Spleen: Normal in size without  focal abnormality. Adrenals/Urinary Tract: No adrenal nodules. Unchanged 1.0 x 1.0 cm posterior left lower renal enhancing lesion (3:39). No hydronephrosis or calculi. No focal bladder wall thickening. Stomach/Bowel: Small hiatal hernia. No evidence of bowel wall thickening, distention, or inflammatory changes. Colonic diverticulosis without acute diverticulitis. Dilated appendix measuring 12 mm demonstrates mural thickening and periappendiceal stranding. Vascular/Lymphatic: Aortic atherosclerosis. No enlarged abdominal or pelvic lymph nodes. Reproductive: No adnexal masses. Other: Small volume right lower quadrant and pelvic free fluid, which appears mildly hyperattenuating. Musculoskeletal: No acute or abnormal lytic or blastic osseous lesions. Multilevel degenerative changes of the partially imaged thoracic and lumbar spine. IMPRESSION: 1. Acute appendicitis with small volume right lower quadrant and pelvic free fluid, which appears mildly hyperattenuating, which may represent early perforation. No drainable fluid collection. 2. Unchanged 1.0 cm posterior left lower renal enhancing lesion, suspicious for renal cell carcinoma. 3. Unchanged common bile duct dilation measuring 1.6 cm, likely related to postcholecystectomy state. 4.  Aortic Atherosclerosis (ICD10-I70.0). Electronically Signed   By: Limin  Xu M.D.   On: 05/18/2024 18:23   EKG: Independently reviewed.  Normal sinus rhythm, rate 71, QTc prolonged at 509.  Assessment/Plan  Acute appendicitis Hemodynamically stable and afebrile.  S/p ceftriaxone in ED.  Metronidazole  IV has been ordered but not given yet.  ED doc already spoke with  surgery Dr. Sebastian, plans for emergency appendectomy tonight. -Will follow after surgery given recommendations - Continue antibiotics  Prolonged QTc QTc 509 on admitting EKG. - AM EKG  Renal lesion on CT Found on initial CT abdomen pelvis, 1.0 cm posterior left lower renal enhancing lesion that is suspicious for renal cell carcinoma.  Patient will need outpatient follow-up for this.  CHF  CAD  HTN - Continue home meds of Imdur , Vascepa , Lasix  - Takes aspirin  and Plavix  at home, will hold until safe to resume s/p emergency laparoscopic appendectomy  GAD Continue home Lexapro  20 mg daily.  Will also order her home Xanax  0.5 mg tablet nightly as needed, however low threshold to DC or reduce based on sedation.  Other continued home medications: Brimonidine  eyedrops, dorzolamide  eyedrops, Simbrinza eyedrops, Bentyl , famotidine , levothyroxine   DVT prophylaxis: SCDs Code Status: Full Family Communication: 2 daughters at bedside, plan discussed with them prior to operation Disposition Plan: Pending improvement and PT OT eval Consults called: Surgery Dr. Sebastian Admission status: observation  Severity of Illness: The appropriate patient status for this patient is OBSERVATION. Observation status is judged to be reasonable and necessary in order to provide the required intensity of service to ensure the patient's safety. The patient's presenting symptoms, physical exam findings, and initial radiographic and laboratory data in the context of their medical condition is felt to place them at decreased risk for further clinical deterioration. Furthermore, it is anticipated that the patient will be medically stable for discharge from the hospital within 2 midnights of admission.   Betsey CHRISTELLA Helling MD [facility] Pager [phone]  If 7PM-7AM, please contact night-coverage [email or web address] Password [email or web address]  05/18/2024, 7:15 PM

## 2024-05-18 NOTE — Transfer of Care (Signed)
 Immediate Anesthesia Transfer of Care Note  Patient: [name]  Procedure(s) Performed: APPENDECTOMY, LAPAROSCOPIC (Abdomen)  Patient Location: PACU  Anesthesia Type:General  Level of Consciousness: awake, alert , and oriented  Airway & Oxygen Therapy: Patient connected to nasal cannula oxygen  Post-op Assessment: Report given to RN and Post -op Vital signs reviewed and stable  Post vital signs: Reviewed and stable  Last Vitals:  Vitals Value Taken Time  BP 118/54 05/18/24 21:15  Temp 36.5 C 05/18/24 21:15  Pulse 72 05/18/24 21:17  Resp 12 05/18/24 21:17  SpO2 100 % 05/18/24 21:17  Vitals shown include unfiled device data.  Last Pain:  Vitals:   05/18/24 2115  TempSrc:   PainSc: 0-No pain         Complications: No notable events documented.

## 2024-05-18 NOTE — Anesthesia Preprocedure Evaluation (Addendum)
 Anesthesia Evaluation  Patient identified by MRN, date of birth, ID band Patient awake    Reviewed: Allergy & Precautions, NPO status , Patient's Chart, lab work & pertinent test results  Airway Mallampati: II  TM Distance: >3 FB Neck ROM: Full    Dental  (+) Edentulous Upper, Edentulous Lower   Pulmonary asthma    Pulmonary exam normal        Cardiovascular hypertension, Pt. on medications + CAD and +CHF   Rhythm:Regular Rate:Normal     Neuro/Psych   Anxiety     TIACVA    GI/Hepatic ,,,(+) Hepatitis -Acute appendicitis    Endo/Other  Hypothyroidism    Renal/GU negative Renal ROS  negative genitourinary   Musculoskeletal  (+)  Fibromyalgia -  Abdominal Normal abdominal exam  (+)   Peds  Hematology Lab Results      Component                Value               Date                      WBC                      8.7                 05/18/2024                HGB                      12.6                05/18/2024                HCT                      37.0                05/18/2024                MCV                      87.8                05/18/2024                PLT                      118 (L)             05/18/2024              Anesthesia Other Findings   Reproductive/Obstetrics                              Anesthesia Physical Anesthesia Plan  ASA: 3  Anesthesia Plan: General   Post-op Pain Management:    Induction: Intravenous  PONV Risk Score and Plan: 3 and Ondansetron , Dexamethasone and Treatment may vary due to age or medical condition  Airway Management Planned: Mask and Oral ETT  Additional Equipment: None  Intra-op Plan:   Post-operative Plan: Extubation in OR  Informed Consent: I have reviewed the patients History and Physical, chart, labs and discussed the procedure including the risks, benefits and alternatives for the proposed anesthesia with the patient or  authorized representative  who has indicated his/her understanding and acceptance.     Dental advisory given  Plan Discussed with: CRNA  Anesthesia Plan Comments:          Anesthesia Quick Evaluation

## 2024-05-19 ENCOUNTER — Other Ambulatory Visit (HOSPITAL_COMMUNITY): Payer: Self-pay

## 2024-05-19 DIAGNOSIS — N289 Disorder of kidney and ureter, unspecified: Secondary | ICD-10-CM | POA: Diagnosis not present

## 2024-05-19 DIAGNOSIS — K3580 Unspecified acute appendicitis: Secondary | ICD-10-CM

## 2024-05-19 LAB — CBC
HCT: 38.2 % (ref 36.0–46.0)
Hemoglobin: 12.3 g/dL (ref 12.0–15.0)
MCH: 28.7 pg (ref 26.0–34.0)
MCHC: 32.2 g/dL (ref 30.0–36.0)
MCV: 89.3 fL (ref 80.0–100.0)
Platelets: 101 K/uL — ABNORMAL LOW (ref 150–400)
RBC: 4.28 MIL/uL (ref 3.87–5.11)
RDW: 13.4 % (ref 11.5–15.5)
WBC: 8.3 K/uL (ref 4.0–10.5)
nRBC: 0 % (ref 0.0–0.2)

## 2024-05-19 LAB — BASIC METABOLIC PANEL WITH GFR
Anion gap: 7 (ref 5–15)
BUN: 10 mg/dL (ref 8–23)
CO2: 21 mmol/L — ABNORMAL LOW (ref 22–32)
Calcium: 7.8 mg/dL — ABNORMAL LOW (ref 8.9–10.3)
Chloride: 106 mmol/L (ref 98–111)
Creatinine, Ser: 0.86 mg/dL (ref 0.44–1.00)
GFR, Estimated: >60 mL/min (ref >60)
Glucose, Bld: 228 mg/dL — ABNORMAL HIGH (ref 70–99)
Potassium: 3.5 mmol/L (ref 3.5–5.1)
Sodium: 134 mmol/L — ABNORMAL LOW (ref 135–145)

## 2024-05-19 MED ORDER — TRAMADOL HCL 50 MG PO TABS: 50.0000 mg | ORAL_TABLET | Freq: Four times a day (QID) | ORAL | Status: DC | PRN

## 2024-05-19 MED ORDER — AMOXICILLIN-POT CLAVULANATE 875-125 MG PO TABS: 1.0000 | ORAL_TABLET | Freq: Two times a day (BID) | ORAL | 0 refills | Status: DC

## 2024-05-19 MED ORDER — ONDANSETRON 4 MG PO TBDP
4.0000 mg | ORAL_TABLET | Freq: Three times a day (TID) | ORAL | 0 refills | Status: AC | PRN
  Filled 2024-05-19: qty 6, 2d supply, fill #0

## 2024-05-19 MED ORDER — TRAMADOL HCL 50 MG PO TABS: 50.0000 mg | ORAL_TABLET | Freq: Four times a day (QID) | ORAL | 0 refills | Status: DC | PRN

## 2024-05-19 MED ORDER — SENNA 8.6 MG PO TABS: 1.0000 | ORAL_TABLET | Freq: Two times a day (BID) | ORAL | Status: DC | PRN

## 2024-05-19 MED ORDER — POTASSIUM CHLORIDE 10 MEQ/100ML IV SOLN
10.0000 meq | INTRAVENOUS | Status: AC
  Administered 2024-05-19 (×3): 10 meq via INTRAVENOUS
  Filled 2024-05-19 (×3): qty 100

## 2024-05-19 MED ORDER — ACETAMINOPHEN 500 MG PO TABS
1000.0000 mg | ORAL_TABLET | Freq: Four times a day (QID) | ORAL | Status: DC
  Administered 2024-05-19: 1000 mg via ORAL
  Filled 2024-05-19: qty 2

## 2024-05-19 MED ORDER — POTASSIUM CHLORIDE CRYS ER 20 MEQ PO TBCR
40.0000 meq | EXTENDED_RELEASE_TABLET | Freq: Once | ORAL | Status: DC
  Filled 2024-05-19: qty 2

## 2024-05-19 MED ORDER — POLYETHYLENE GLYCOL 3350 17 G PO PACK: 17.0000 g | PACK | Freq: Two times a day (BID) | ORAL | Status: DC | PRN

## 2024-05-19 MED ORDER — SENNA 8.6 MG PO TABS
1.0000 | ORAL_TABLET | Freq: Every day | ORAL | 0 refills | Status: AC
  Filled 2024-05-19: qty 120, 120d supply, fill #0

## 2024-05-19 MED ORDER — SIMETHICONE 40 MG/0.6ML PO SUSP
40.0000 mg | Freq: Four times a day (QID) | ORAL | Status: DC | PRN
  Administered 2024-05-19: 40 mg via ORAL
  Filled 2024-05-19 (×2): qty 0.6

## 2024-05-19 MED ORDER — MORPHINE SULFATE (PF) 2 MG/ML IV SOLN: 2.0000 mg | INTRAVENOUS | Status: DC | PRN

## 2024-05-19 MED ORDER — ACETAMINOPHEN 325 MG PO TABS
650.0000 mg | ORAL_TABLET | Freq: Four times a day (QID) | ORAL | Status: DC | PRN
  Administered 2024-05-19: 650 mg via ORAL
  Filled 2024-05-19: qty 2

## 2024-05-19 MED ORDER — SIMETHICONE 40 MG/0.6ML PO SUSP
40.0000 mg | Freq: Four times a day (QID) | ORAL | 0 refills | Status: AC | PRN
  Filled 2024-05-19: qty 30, 13d supply, fill #0

## 2024-05-19 MED ORDER — ENSURE PLUS HIGH PROTEIN PO LIQD
237.0000 mL | Freq: Two times a day (BID) | ORAL | 0 refills | Status: AC
  Filled 2024-05-19: qty 2370, 5d supply, fill #0

## 2024-05-19 MED ORDER — TRAMADOL HCL 50 MG PO TABS
50.0000 mg | ORAL_TABLET | Freq: Four times a day (QID) | ORAL | 0 refills | Status: AC | PRN
  Filled 2024-05-19: qty 15, 4d supply, fill #0

## 2024-05-19 MED ORDER — ACETAMINOPHEN 500 MG PO TABS: 1000.0000 mg | ORAL_TABLET | Freq: Four times a day (QID) | ORAL | Status: AC | PRN

## 2024-05-19 MED ORDER — POLYETHYLENE GLYCOL 3350 17 GM/SCOOP PO POWD
17.0000 g | Freq: Every day | ORAL | 0 refills | Status: AC
  Filled 2024-05-19: qty 238, 14d supply, fill #0

## 2024-05-19 MED ORDER — AMOXICILLIN-POT CLAVULANATE 875-125 MG PO TABS
1.0000 | ORAL_TABLET | Freq: Two times a day (BID) | ORAL | 0 refills | Status: AC
  Filled 2024-05-19: qty 10, 5d supply, fill #0

## 2024-05-19 MED ORDER — ENOXAPARIN SODIUM 40 MG/0.4ML IJ SOSY
40.0000 mg | PREFILLED_SYRINGE | INTRAMUSCULAR | Status: DC
  Administered 2024-05-19: 40 mg via SUBCUTANEOUS
  Filled 2024-05-19: qty 0.4

## 2024-05-19 MED ORDER — ADULT MULTIVITAMIN W/MINERALS CH
1.0000 | ORAL_TABLET | Freq: Every day | ORAL | 0 refills | Status: AC
  Filled 2024-05-19: qty 30, 30d supply, fill #0

## 2024-05-19 NOTE — Discharge Instructions (Signed)

## 2024-05-19 NOTE — Progress Notes (Signed)
 Pt arrived to the unit, reports no pain. Call bell at reach and family at bedside.

## 2024-05-19 NOTE — Discharge Summary (Signed)
 Physician Discharge Summary   Patient: [name] MRN: [id number] DOB: 1941/03/01  Admit date:     05/18/2024  Discharge date: 05/19/2024  Discharge Physician: Alejandro Marker, DO   PCP: Joshua Debby CROME, MD   Recommendations at discharge:   Follow-up with PCP within 1 to 2 weeks repeat CBC, CMP, mag, Phos within 1 week Follow-up with general surgery in outpatient setting in the coming weeks and continue antibiotic therapy for 5 days total per surgery recommendations. Follow-up in outpatient setting for renal lesion that is noted on CT which is concerning for renal cell carcinoma; PCP to refer to Urology  Discharge Diagnoses: Principal Problem:   Acute appendicitis  Resolved Problems:   * No resolved hospital problems. [facility] Course: The patient is an 83 year old obese Caucasian female with a past medical history significant for balance of CHF, CAD, GAD, hypothyroidism, B12 deficiency, essential hypertension, IBS, asthma, NASH, osteopenia, chronic thrombocytopenia history of squamous cell carcinoma and other comorbidities who presented with abdominal pain, nausea vomiting.  She brought to the ED and was given half a liter normal saline bolus, IV Zofran  and 100 mcg of fentanyl .  She was noted to have a mild hypokalemia and thrombocytopenia.  CT of the abdomen pelvis found acute appendicitis with small volume right lower quadrant pelvic free fluid.  She was taken for emergent laparoscopic appendectomy by the surgical team.  She was found to have an acute perforated appendicitis and subsequently did well and improved and passed flatus and tolerated her breakfast without issues.  She ambulated and voided and was cleared by the surgical team.  Labs were very stable and her potassium was repleted prior to discharge.  She will follow-up with PCP and general surgery outpatient setting and will need outpatient follow-up for her unchanged renal lesion concerning for renal cell carcinoma.  Upon  further review she looks like she has a chronic thrombocytopenia dating back to 2019.  She will have her PCP referred to hematologist as she declined hematology referral at this time.  Assessment and Plan:  Acute appendicitis: Hemodynamically stable and afebrile. CT Abd/Pelvis showed Acute appendicitis with small volume right lower quadrant and pelvic free fluid, which appears mildly hyperattenuating, which may represent early perforation. No drainable fluid collection.   -S/p ceftriaxone and Metronidazole .  ED doc already spoke with surgery Dr. Sebastian, plans for emergency appendectomy tonight.  Antibiotics changed to p.o. Augmentin  for discharge and she did well and tolerated her diet.  Pain well-controlled.   Prolonged Qtc: QTc 509 on admitting EKG. avoid QTc prolonging medications if possible.  Replete electrolytes and her potassium was 3.5 and repleted prior to discharge.   Renal lesion on CT: Found on initial CT Abd/Pelvis, 1.0 cm posterior left lower renal enhancing lesion that is suspicious for renal cell carcinoma and unchanged.  Patient will need outpatient follow-up for this.  CHF  CAD  HTN:  Continue home meds of Imdur , Vascepa , Lasix  - Takes aspirin  and Plavix  at home, will hold until safe to resume s/p emergency laparoscopic appendectomy and discussed with general surgery and she can resume these on 05/20/2024. CT also noted Aortic Atherosclerosis   HJI:Rnwupwlz home Lexapro  20 mg daily.  Will also order her home Xanax  0.5 mg tablet nightly as needed, however low threshold to DC or reduce based on sedation.  Thrombocytopenia: Chronic and has only been normal once in the last 7 years in our system.  Outpatient follow-up with hematology.  Monitor for signs and symptoms bleeding  Glaucoma: Brimonidine  eyedrops, dorzolamide  eyedrops, Simbrinza eyedrops  Hypothyroidism: C/w Levothyroxine   GERD/GI Prophylaxis: Bentyl , famotidine   Class I Obesity: Complicates overall prognosis and  care. Estimated body mass index is 31.96 kg/m as calculated from the following:   Height as of this encounter: 5' 3 (1.6 m).   Weight as of 05/06/24: 81.8 kg. Weight Loss and Dietary Counseling given  Consultants: General Surgery Procedures performed: Laparpscopic Appendectomy   Disposition: Home Diet recommendation:  Discharge Diet Orders (From admission, onward)     Start     Ordered   05/19/24 0000  Diet - low sodium heart healthy        05/19/24 1104           Cardiac diet DISCHARGE MEDICATION: Allergies as of 05/19/2024       Reactions   Diltiazem Hcl Other (See Comments)   REACTION: pt states it made her dizzy...   Statins    Muscle aches   Zetia  [ezetimibe ]    it made me feel bad   Codeine Nausea Only   Sulfonamide Derivatives Hives, Itching, Rash   Tape Itching   Protonix  [pantoprazole ] Nausea Only   Tramadol  Itching        Medication List     TAKE these medications    acetaminophen  500 MG tablet Commonly known as: TYLENOL  Take 2 tablets (1,000 mg total) by mouth every 6 (six) hours as needed. What changed:  how much to take reasons to take this   ALPRAZolam  0.5 MG tablet Commonly known as: XANAX  Take 1 tablet (0.5 mg total) by mouth 3 (three) times daily as needed. for anxiety   amoxicillin -clavulanate 875-125 MG tablet Commonly known as: AUGMENTIN  Take 1 tablet by mouth 2 (two) times daily for 5 days.   aspirin  EC 81 MG tablet Take 1 tablet (81 mg total) by mouth daily. Swallow whole.   brimonidine  0.1 % Soln Commonly known as: ALPHAGAN  P Place 1 drop into both eyes 4 (four) times daily.   CertaVite/Antioxidants Tabs Take 1 tablet by mouth daily.   clopidogrel  75 MG tablet Commonly known as: PLAVIX  Take 1 tablet (75 mg total) by mouth daily with breakfast.   dicyclomine  10 MG capsule Commonly known as: BENTYL  TAKE 1 CAPSULE BY MOUTH 4 TIMES DAILY. (BEFORE MEALS AND AT BEDTIME)   dorzolamide  2 % ophthalmic solution Commonly  known as: TRUSOPT  Place 1 drop into both eyes 2 (two) times daily.   escitalopram  20 MG tablet Commonly known as: LEXAPRO  Take 1 tablet (20 mg total) by mouth at bedtime.   famotidine  20 MG tablet Commonly known as: PEPCID  Take 1 tablet by mouth twice daily   feeding supplement Liqd Take 237 mLs by mouth 2 (two) times daily between meals.   furosemide  20 MG tablet Commonly known as: LASIX  Take 1 tablet (20 mg total) by mouth every morning.   isosorbide  mononitrate 30 MG 24 hr tablet Commonly known as: IMDUR  Take 1 tablet (30 mg total) by mouth daily.   levothyroxine  25 MCG tablet Commonly known as: SYNTHROID  Take 1 tablet (25 mcg total) by mouth daily before breakfast.   nystatin  powder Commonly known as: MYCOSTATIN /NYSTOP  Apply 1 Application topically 3 (three) times daily. What changed:  when to take this reasons to take this   ondansetron  4 MG disintegrating tablet Commonly known as: ZOFRAN -ODT Take 1 tablet (4 mg total) by mouth every 8 (eight) hours as needed for nausea or vomiting.   polyethylene glycol powder 17 GM/SCOOP powder Commonly known as: GLYCOLAX /MIRALAX   Dissolve 1 capful (17g) in 4-8 ounces of liquid and take by mouth daily.   potassium chloride  SA 20 MEQ tablet Commonly known as: KLOR-CON  M Take 1 tablet by mouth twice daily   Repatha  SureClick 140 MG/ML Soaj Generic drug: Evolocumab  Inject 140 mg into the skin every 14 (fourteen) days.   senna 8.6 MG Tabs tablet Commonly known as: SENOKOT Take 1 tablet (8.6 mg total) by mouth daily.   Simbrinza 1-0.2 % Susp Generic drug: Brinzolamide -Brimonidine  Apply 1 drop to eye 2 (two) times daily.   Simethicone  Drops Infants 20 MG/0.3ML drops Generic drug: simethicone  Take 0.6 mLs (40 mg total) by mouth every 6 (six) hours as needed for flatulence.   traMADol  50 MG tablet Commonly known as: ULTRAM  Take 1 tablet (50 mg total) by mouth every 6 (six) hours as needed for moderate pain (pain score  4-6).   Vascepa  1 g capsule Generic drug: icosapent  Ethyl TAKE 2 CAPSULES BY MOUTH IN THE MORNING AND 2 AT BEDTIME               Discharge Care Instructions  (From admission, onward)           Start     Ordered   05/19/24 0000  Discharge wound care:       Comments: Per General Surgery   05/19/24 1104            Follow-up Information     Maczis, Puja Gosai, PA-C Follow up in 3 week(s).   Specialty: General Surgery Why: Office will call you with a follow up appointment, If you don't hear from the office, please call, Arrive 30 minutes prior to your appointment time, Please bring your insurance card and photo ID Contact information: [address] [facility] [place] [address] [phone]                Discharge Exam: There were no vitals filed for this visit. Vitals:   05/19/24 0444 05/19/24 1011  BP: (!) 121/50 (!) 121/54  Pulse: 62 62  Resp: 18 17  Temp: 97.7 F (36.5 C) 98 F (36.7 C)  SpO2: 95% 98%   Examination: Physical Exam:  Constitutional: WN/WD obese elderly Caucasian female in no acute distress Respiratory: Diminished to auscultation bilaterally, no wheezing, rales, rhonchi or crackles. Normal respiratory effort and patient is not tachypenic. No accessory muscle use.  Unlabored breathing Cardiovascular: RRR, no murmurs / rubs / gallops. S1 and S2 auscultated.  Mild extremity edema Abdomen: Soft, slightly tender palpate and distended secondary to body habitus.  Bowel sounds positive.  GU: Deferred. Musculoskeletal: No clubbing / cyanosis of digits/nails. No joint deformity upper and lower extremities.  Skin: No rashes, lesions, ulcers on limited skin evaluation. No induration; Warm and dry.  Neurologic: CN 2-12 grossly intact with no focal deficits. Romberg sign and cerebellar reflexes not assessed.  Psychiatric: Normal judgment and insight. Alert and oriented x 3. Normal mood and appropriate affect.    Condition at discharge: stable  The results of significant diagnostics from this hospitalization (including imaging, microbiology, ancillary and laboratory) are listed below for reference.   Imaging Studies: CT ABDOMEN PELVIS W CONTRAST Result Date: 05/18/2024 CLINICAL DATA:  Abdominal pain, nausea, and vomiting EXAM: CT ABDOMEN AND PELVIS WITH CONTRAST TECHNIQUE: Multidetector CT imaging of the abdomen and pelvis was performed using the standard protocol following bolus administration of intravenous contrast. RADIATION DOSE REDUCTION: This exam was performed according to the departmental dose-optimization program which includes  automated exposure control, adjustment of the mA and/or kV according to patient size and/or use of iterative reconstruction technique. CONTRAST:  75mL OMNIPAQUE  IOHEXOL  350 MG/ML SOLN COMPARISON:  CT abdomen and pelvis dated 03/29/2023 FINDINGS: Lower chest: No focal consolidation or pulmonary nodule in the lung bases. No pleural effusion or pneumothorax demonstrated. Partially imaged heart size is normal. Coronary artery calcifications. Hepatobiliary: Mildly lobulated hepatic contour. Unchanged common bile duct dilation measuring 1.6 cm. No radiopaque stones. Cholecystectomy. Pancreas: No focal lesions or main ductal dilation. Spleen: Normal in size without focal abnormality. Adrenals/Urinary Tract: No adrenal nodules. Unchanged 1.0 x 1.0 cm posterior left lower renal enhancing lesion (3:39). No hydronephrosis or calculi. No focal bladder wall thickening. Stomach/Bowel: Small hiatal hernia. No evidence of bowel wall thickening, distention, or inflammatory changes. Colonic diverticulosis without acute diverticulitis. Dilated appendix measuring 12 mm demonstrates mural thickening and periappendiceal stranding. Vascular/Lymphatic: Aortic atherosclerosis. No enlarged abdominal or pelvic lymph nodes. Reproductive: No adnexal masses. Other: Small volume right lower quadrant and pelvic  free fluid, which appears mildly hyperattenuating. Musculoskeletal: No acute or abnormal lytic or blastic osseous lesions. Multilevel degenerative changes of the partially imaged thoracic and lumbar spine. IMPRESSION: 1. Acute appendicitis with small volume right lower quadrant and pelvic free fluid, which appears mildly hyperattenuating, which may represent early perforation. No drainable fluid collection. 2. Unchanged 1.0 cm posterior left lower renal enhancing lesion, suspicious for renal cell carcinoma. 3. Unchanged common bile duct dilation measuring 1.6 cm, likely related to postcholecystectomy state. 4.  Aortic Atherosclerosis (ICD10-I70.0). Electronically Signed   By: Limin  Xu M.D.   On: 05/18/2024 18:23   Microbiology: Results for orders placed or performed in visit on 08/05/19  CULTURE, URINE COMPREHENSIVE     Status: Abnormal   Collection Time: 08/05/19 11:10 AM   Specimen: Urine  Result Value Ref Range Status   Source: URINE  Final   Status: FINAL  Final   Isolate 1: Escherichia coli (A)  Final    Comment: Greater than 100,000 CFU/mL of Escherichia coli   Isolate 2: Escherichia coli (A)  Final    Comment: 10,000-49,000 CFU/mL of Escherichia coli      Susceptibility   Escherichia coli - CULT, URN, SPECIAL NEGATIVE 1    AMOX/CLAVULANIC 4 Sensitive     AMPICILLIN 4 Sensitive     AMPICILLIN/SULBACTAM <=2 Sensitive     CEFAZOLIN* <=4 Not Reportable      * For infections other than uncomplicated UTIcaused by E. coli, K. pneumoniae or P. mirabilis:Cefazolin is resistant if MIC > or = 8 mcg/mL.(Distinguishing susceptible versus intermediatefor isolates with MIC < or = 4 mcg/mL requiresadditional testing.)For uncomplicated UTI caused by E. coli,K. pneumoniae or P. mirabilis: Cefazolin issusceptible if MIC <32 mcg/mL and predictssusceptible to the oral agents cefaclor, cefdinir ,cefpodoxime, cefprozil, cefuroxime, cephalexinand loracarbef.    CEFEPIME <=1 Sensitive     CEFTRIAXONE <=1  Sensitive     CIPROFLOXACIN <=0.25 Sensitive     LEVOFLOXACIN  <=0.12 Sensitive     ERTAPENEM <=0.5 Sensitive     GENTAMICIN <=1 Sensitive     IMIPENEM <=0.25 Sensitive     NITROFURANTOIN  <=16 Sensitive     PIP/TAZO <=4 Sensitive     TOBRAMYCIN <=1 Sensitive     TRIMETH/SULFA <=20 Sensitive    Escherichia coli - CULT, URN, SPECIAL NEGATIVE 2    AMOX/CLAVULANIC 16 Intermediate     AMPICILLIN >=32 Resistant     AMPICILLIN/SULBACTAM >=32 Resistant     CEFAZOLIN 16 Resistant     CEFEPIME <=  1 Sensitive     CEFTRIAXONE <=1 Sensitive     CIPROFLOXACIN >=4 Resistant     LEVOFLOXACIN  >=8 Resistant     ERTAPENEM <=0.5 Sensitive     GENTAMICIN <=1 Sensitive     IMIPENEM <=0.25 Sensitive     NITROFURANTOIN  <=16 Sensitive     PIP/TAZO 32 Intermediate     TOBRAMYCIN <=1 Sensitive     TRIMETH/SULFA* <=20 Sensitive      * For infections other than uncomplicated UTIcaused by E. coli, K. pneumoniae or P. mirabilis:Cefazolin is resistant if MIC > or = 8 mcg/mL.(Distinguishing susceptible versus intermediatefor isolates with MIC < or = 4 mcg/mL requiresadditional testing.)For uncomplicated UTI caused by E. coli,K. pneumoniae or P. mirabilis: Cefazolin issusceptible if MIC <32 mcg/mL and predictssusceptible to the oral agents cefaclor, cefdinir ,cefpodoxime, cefprozil, cefuroxime, cephalexinand loracarbef.Legend:S = Susceptible  I = IntermediateR = Resistant  NS = Not susceptible* = Not tested  NR = Not reported**NN = See antimicrobic comments   Labs: CBC: Recent Labs  Lab 05/18/24 1548 05/18/24 1555 05/19/24 0140  WBC 8.7  --  8.3  NEUTROABS 7.4  --   --   HGB 12.7 12.6 12.3  HCT 38.2 37.0 38.2  MCV 87.8  --  89.3  PLT 118*  --  101*   Basic Metabolic Panel: Recent Labs  Lab 05/18/24 1548 05/18/24 1555 05/19/24 0140  NA 139 140 134*  K 3.1* 3.1* 3.5  CL 108 105 106  CO2 23  --  21*  GLUCOSE 107* 103* 228*  BUN 11 12 10   CREATININE 0.75 0.80 0.86  CALCIUM  8.3*  --  7.8*   Liver  Function Tests: Recent Labs  Lab 05/18/24 1548  AST 38  ALT 26  ALKPHOS 86  BILITOT 1.2  PROT 5.7*  ALBUMIN 3.4*   CBG: No results for input(s): GLUCAP in the last 168 hours.  Discharge time spent: greater than 30 minutes.  Signed: Alejandro Marker, DO [facility] 05/21/2024

## 2024-05-19 NOTE — Plan of Care (Signed)
   Problem: Education: Goal: Knowledge of General Education information will improve Description: Including pain rating scale, medication(s)/side effects and non-pharmacologic comfort measures Outcome: Progressing   Problem: Activity: Goal: Risk for activity intolerance will decrease Outcome: Progressing   Problem: Nutrition: Goal: Adequate nutrition will be maintained Outcome: Progressing   Problem: Coping: Goal: Level of anxiety will decrease Outcome: Progressing

## 2024-05-19 NOTE — Progress Notes (Signed)
 1 Day Post-Op  Subjective: Patient feeling much better today.  Some gas pains.  Passing flatus.  No BM.  No nausea.  Getting ready to eat her breakfast.  Voiding and has ambulated   Objective: Vital signs in last 24 hours: Temp:  [97.6 F (36.4 C)-99.3 F (37.4 C)] 97.7 F (36.5 C) (11/30 0444) Pulse Rate:  [62-73] 62 (11/30 0444) Resp:  [13-19] 18 (11/30 0444) BP: (105-142)/(50-77) 121/50 (11/30 0444) SpO2:  [92 %-100 %] 95 % (11/30 0444) Last BM Date : 05/17/24  Intake/Output from previous day: 11/29 0701 - 11/30 0700 In: 1717.9 [I.V.:500; IV Piggyback:1217.9] Out: 15 [Blood:15] Intake/Output this shift: No intake/output data recorded.  PE: Abd: soft, appropriately tender, incisions c/d/I, ND  Lab Results:  Recent Labs    05/18/24 1548 05/18/24 1555 05/19/24 0140  WBC 8.7  --  8.3  HGB 12.7 12.6 12.3  HCT 38.2 37.0 38.2  PLT 118*  --  101*   BMET Recent Labs    05/18/24 1548 05/18/24 1555 05/19/24 0140  NA 139 140 134*  K 3.1* 3.1* 3.5  CL 108 105 106  CO2 23  --  21*  GLUCOSE 107* 103* 228*  BUN 11 12 10   CREATININE 0.75 0.80 0.86  CALCIUM  8.3*  --  7.8*   PT/INR No results for input(s): LABPROT, INR in the last 72 hours. CMP     Component Value Date/Time   NA 134 (L) 05/19/2024 0140   K 3.5 05/19/2024 0140   CL 106 05/19/2024 0140   CO2 21 (L) 05/19/2024 0140   GLUCOSE 228 (H) 05/19/2024 0140   BUN 10 05/19/2024 0140   CREATININE 0.86 05/19/2024 0140   CALCIUM  7.8 (L) 05/19/2024 0140   PROT 5.7 (L) 05/18/2024 1548   PROT 6.7 12/29/2023 1036   ALBUMIN 3.4 (L) 05/18/2024 1548   ALBUMIN 4.1 12/29/2023 1036   AST 38 05/18/2024 1548   ALT 26 05/18/2024 1548   ALKPHOS 86 05/18/2024 1548   BILITOT 1.2 05/18/2024 1548   BILITOT 0.6 12/29/2023 1036   GFRNONAA >60 05/19/2024 0140   GFRAA >60 07/06/2017 0958   Lipase     Component Value Date/Time   LIPASE 23 05/18/2024 1548       Studies/Results: CT ABDOMEN PELVIS W  CONTRAST Result Date: 05/18/2024 CLINICAL DATA:  Abdominal pain, nausea, and vomiting EXAM: CT ABDOMEN AND PELVIS WITH CONTRAST TECHNIQUE: Multidetector CT imaging of the abdomen and pelvis was performed using the standard protocol following bolus administration of intravenous contrast. RADIATION DOSE REDUCTION: This exam was performed according to the departmental dose-optimization program which includes automated exposure control, adjustment of the mA and/or kV according to patient size and/or use of iterative reconstruction technique. CONTRAST:  75mL OMNIPAQUE  IOHEXOL  350 MG/ML SOLN COMPARISON:  CT abdomen and pelvis dated 03/29/2023 FINDINGS: Lower chest: No focal consolidation or pulmonary nodule in the lung bases. No pleural effusion or pneumothorax demonstrated. Partially imaged heart size is normal. Coronary artery calcifications. Hepatobiliary: Mildly lobulated hepatic contour. Unchanged common bile duct dilation measuring 1.6 cm. No radiopaque stones. Cholecystectomy. Pancreas: No focal lesions or main ductal dilation. Spleen: Normal in size without focal abnormality. Adrenals/Urinary Tract: No adrenal nodules. Unchanged 1.0 x 1.0 cm posterior left lower renal enhancing lesion (3:39). No hydronephrosis or calculi. No focal bladder wall thickening. Stomach/Bowel: Small hiatal hernia. No evidence of bowel wall thickening, distention, or inflammatory changes. Colonic diverticulosis without acute diverticulitis. Dilated appendix measuring 12 mm demonstrates mural thickening and periappendiceal stranding.  Vascular/Lymphatic: Aortic atherosclerosis. No enlarged abdominal or pelvic lymph nodes. Reproductive: No adnexal masses. Other: Small volume right lower quadrant and pelvic free fluid, which appears mildly hyperattenuating. Musculoskeletal: No acute or abnormal lytic or blastic osseous lesions. Multilevel degenerative changes of the partially imaged thoracic and lumbar spine. IMPRESSION: 1. Acute  appendicitis with small volume right lower quadrant and pelvic free fluid, which appears mildly hyperattenuating, which may represent early perforation. No drainable fluid collection. 2. Unchanged 1.0 cm posterior left lower renal enhancing lesion, suspicious for renal cell carcinoma. 3. Unchanged common bile duct dilation measuring 1.6 cm, likely related to postcholecystectomy state. 4.  Aortic Atherosclerosis (ICD10-I70.0). Electronically Signed   By: Limin  Xu M.D.   On: 05/18/2024 18:23    Anti-infectives: Anti-infectives (From admission, onward)    Start     Dose/Rate Route Frequency Ordered Stop   05/19/24 1845  cefTRIAXone (ROCEPHIN) 2 g in sodium chloride  0.9 % 100 mL IVPB        2 g 200 mL/hr over 30 Minutes Intravenous Every 24 hours 05/18/24 2211     05/19/24 0800  metroNIDAZOLE  (FLAGYL ) IVPB 500 mg        500 mg 100 mL/hr over 60 Minutes Intravenous Every 12 hours 05/18/24 2211     05/19/24 0000  amoxicillin -clavulanate (AUGMENTIN ) 875-125 MG tablet        1 tablet Oral 2 times daily 05/19/24 0941 05/24/24 2359   05/18/24 1845  cefTRIAXone (ROCEPHIN) 2 g in sodium chloride  0.9 % 100 mL IVPB        2 g 200 mL/hr over 30 Minutes Intravenous  Once 05/18/24 1838 05/18/24 1929   05/18/24 1845  metroNIDAZOLE  (FLAGYL ) IVPB 500 mg        500 mg 100 mL/hr over 60 Minutes Intravenous  Once 05/18/24 1838 05/18/24 1958        Assessment/Plan POD 1, s/p lap appy for perforated appendicitis by Dr. Sebastian, 11/29 -doing well surgically -getting ready to eat her breakfast -having bowel function with no nausea -minimal pain -voiding -has ambulated to the bathroom -cont abx therapy for 5 days total due to perforation -if patient is medically stable today, then she could go home from a surgical standpoint.  I have sent in scripts for her as well as arranged follow up, etc -d/w primary service.   FEN - regular VTE - lovenox , may resume plavix  tomorrow ID - Rocephin/flagyl , will treat  for 5 days total    LOS: 0 days    Burnard FORBES Banter , [facility] 05/19/2024, 9:42 AM Please see Amion for pager number during day hours 7:00am-4:30pm or 7:00am -11:30am on weekends

## 2024-05-19 NOTE — Hospital Course (Signed)
 The patient is an 83 year old obese Caucasian female with a past medical history significant for balance of CHF, CAD, GAD, hypothyroidism, B12 deficiency, essential hypertension, IBS, asthma, NASH, osteopenia, chronic thrombocytopenia history of squamous cell carcinoma and other comorbidities who presented with abdominal pain, nausea vomiting.  She brought to the ED and was given half a liter normal saline bolus, IV Zofran  and 100 mcg of fentanyl .  She was noted to have a mild hypokalemia and thrombocytopenia.  CT of the abdomen pelvis found acute appendicitis with small volume right lower quadrant pelvic free fluid.  She was taken for emergent laparoscopic appendectomy by the surgical team.  She was found to have an acute perforated appendicitis and subsequently did well and improved and passed flatus and tolerated her breakfast without issues.  She ambulated and voided and was cleared by the surgical team.  Labs were very stable and her potassium was repleted prior to discharge.  She will follow-up with PCP and general surgery outpatient setting.  Upon further review she looks like she has a chronic thrombocytopenia dating back to 2019.  She will have her PCP referred to hematologist as she declined hematology referral at this time.  Assessment and Plan:  Acute appendicitis Hemodynamically stable and afebrile.  S/p ceftriaxone in ED.  Metronidazole  IV has been ordered but not given yet.  ED doc already spoke with surgery Dr. Sebastian, plans for emergency appendectomy tonight. -Will follow after surgery given recommendations - Continue antibiotics   Prolonged QTc QTc 509 on admitting EKG. - AM EKG   Renal lesion on CT Found on initial CT abdomen pelvis, 1.0 cm posterior left lower renal enhancing lesion that is suspicious for renal cell carcinoma.  Patient will need outpatient follow-up for this.   CHF  CAD  HTN - Continue home meds of Imdur , Vascepa , Lasix  - Takes aspirin  and Plavix  at home,  will hold until safe to resume s/p emergency laparoscopic appendectomy   GAD Continue home Lexapro  20 mg daily.  Will also order her home Xanax  0.5 mg tablet nightly as needed, however low threshold to DC or reduce based on sedation.   Other continued home medications: Brimonidine  eyedrops, dorzolamide  eyedrops, Simbrinza eyedrops, Bentyl , famotidine , levothyroxine 

## 2024-05-20 ENCOUNTER — Other Ambulatory Visit (HOSPITAL_COMMUNITY): Payer: Self-pay

## 2024-05-20 ENCOUNTER — Encounter (HOSPITAL_COMMUNITY): Payer: Self-pay | Admitting: General Surgery

## 2024-05-20 NOTE — Anesthesia Postprocedure Evaluation (Signed)
 Anesthesia Post Note  Patient: [name]  Procedure(s) Performed: APPENDECTOMY, LAPAROSCOPIC (Abdomen)     Patient location during evaluation: PACU Anesthesia Type: General Level of consciousness: awake and alert Pain management: pain level controlled Vital Signs Assessment: post-procedure vital signs reviewed and stable Respiratory status: spontaneous breathing, nonlabored ventilation, respiratory function stable and patient connected to nasal cannula oxygen Cardiovascular status: blood pressure returned to baseline and stable Postop Assessment: no apparent nausea or vomiting Anesthetic complications: no   No notable events documented.  Last Vitals:  Vitals:   05/19/24 0444 05/19/24 1011  BP: (!) 121/50 (!) 121/54  Pulse: 62 62  Resp: 18 17  Temp: 36.5 C 36.7 C  SpO2: 95% 98%    Last Pain:  Vitals:   05/19/24 1103  TempSrc:   PainSc: 3                  Aniyah Nobis P Terrance Usery

## 2024-05-22 LAB — SURGICAL PATHOLOGY

## 2024-06-19 ENCOUNTER — Ambulatory Visit: Admitting: Internal Medicine

## 2024-06-19 ENCOUNTER — Ambulatory Visit (INDEPENDENT_AMBULATORY_CARE_PROVIDER_SITE_OTHER)

## 2024-06-19 ENCOUNTER — Ambulatory Visit: Payer: Self-pay | Admitting: Internal Medicine

## 2024-06-19 ENCOUNTER — Encounter: Payer: Self-pay | Admitting: Internal Medicine

## 2024-06-19 VITALS — BP 142/74 | HR 70 | Temp 98.2°F | Resp 16 | Ht 63.0 in | Wt 175.6 lb

## 2024-06-19 DIAGNOSIS — R052 Subacute cough: Secondary | ICD-10-CM | POA: Diagnosis not present

## 2024-06-19 DIAGNOSIS — R233 Spontaneous ecchymoses: Secondary | ICD-10-CM

## 2024-06-19 DIAGNOSIS — R10814 Left lower quadrant abdominal tenderness: Secondary | ICD-10-CM

## 2024-06-19 DIAGNOSIS — I10 Essential (primary) hypertension: Secondary | ICD-10-CM

## 2024-06-19 DIAGNOSIS — J069 Acute upper respiratory infection, unspecified: Secondary | ICD-10-CM | POA: Diagnosis not present

## 2024-06-19 LAB — URINALYSIS, ROUTINE W REFLEX MICROSCOPIC
Bilirubin Urine: NEGATIVE
Hgb urine dipstick: NEGATIVE
Ketones, ur: NEGATIVE
Leukocytes,Ua: NEGATIVE
Nitrite: NEGATIVE
Specific Gravity, Urine: 1.01 (ref 1.000–1.030)
Total Protein, Urine: NEGATIVE
Urine Glucose: NEGATIVE
Urobilinogen, UA: 0.2 (ref 0.0–1.0)
pH: 6 (ref 5.0–8.0)

## 2024-06-19 LAB — LIPASE: Lipase: 38 U/L (ref 11.0–59.0)

## 2024-06-19 LAB — CBC WITH DIFFERENTIAL/PLATELET
Basophils Absolute: 0 K/uL (ref 0.0–0.1)
Basophils Relative: 0.3 % (ref 0.0–3.0)
Eosinophils Absolute: 0.1 K/uL (ref 0.0–0.7)
Eosinophils Relative: 4.1 % (ref 0.0–5.0)
HCT: 42.1 % (ref 36.0–46.0)
Hemoglobin: 14.2 g/dL (ref 12.0–15.0)
Lymphocytes Relative: 31.4 % (ref 12.0–46.0)
Lymphs Abs: 0.8 K/uL (ref 0.7–4.0)
MCHC: 33.8 g/dL (ref 30.0–36.0)
MCV: 87.8 fl (ref 78.0–100.0)
Monocytes Absolute: 0.2 K/uL (ref 0.1–1.0)
Monocytes Relative: 6.3 % (ref 3.0–12.0)
Neutro Abs: 1.6 K/uL (ref 1.4–7.7)
Neutrophils Relative %: 57.9 % (ref 43.0–77.0)
Platelets: 109 K/uL — ABNORMAL LOW (ref 150.0–400.0)
RBC: 4.79 Mil/uL (ref 3.87–5.11)
RDW: 14.8 % (ref 11.5–15.5)
WBC: 2.7 K/uL — ABNORMAL LOW (ref 4.0–10.5)

## 2024-06-19 LAB — BASIC METABOLIC PANEL WITH GFR
BUN: 13 mg/dL (ref 6–23)
CO2: 27 meq/L (ref 19–32)
Calcium: 8.9 mg/dL (ref 8.4–10.5)
Chloride: 103 meq/L (ref 96–112)
Creatinine, Ser: 0.68 mg/dL (ref 0.40–1.20)
GFR: 80.57 mL/min
Glucose, Bld: 99 mg/dL (ref 70–99)
Potassium: 3.6 meq/L (ref 3.5–5.1)
Sodium: 138 meq/L (ref 135–145)

## 2024-06-19 LAB — APTT: aPTT: 28.9 s (ref 25.4–36.8)

## 2024-06-19 LAB — PROTIME-INR
INR: 1 ratio (ref 0.8–1.0)
Prothrombin Time: 10.9 s (ref 9.6–13.1)

## 2024-06-19 LAB — TSH: TSH: 3.47 u[IU]/mL (ref 0.35–5.50)

## 2024-06-19 NOTE — Progress Notes (Signed)
 "     Subjective:  Patient ID: SHAKIRA LOS, female    DOB: 03-21-1941  Age: 83 y.o. MRN: [id number]  CC: Cough and Hypertension   HPI JOSALYNN JOHNDROW presents for f/up ---  Discussed the use of AI scribe software for clinical note transcription with the patient, who gave verbal consent to proceed.  History of Present Illness VENISA FRAMPTON is an 83 year old female who presents with fever, chills, and a sore throat.  She has been experiencing symptoms since December 26th, including hoarseness, a sore throat localized to the left side, and fever. She experiences fever and chills, with the fever breaking and leaving her feeling clammy. No nausea, vomiting, abdominal pain, or night sweats.  She has developed a cough recently but has no wheezing, shortness of breath, or hemoptysis. She is not taking any medication for the cough.  She reports a significant bruise on her stomach that appeared suddenly two days ago and was initially painful. Additionally, she experienced epistaxis from her left nostril this morning. No history of similar bleeding or bruising.  She notes some swelling in her legs or feet at night but no major abdominal pain. No otalgia.     Outpatient Medications Prior to Visit  Medication Sig Dispense Refill   acetaminophen  (TYLENOL ) 500 MG tablet Take 2 tablets (1,000 mg total) by mouth every 6 (six) hours as needed.     ALPRAZolam  (XANAX ) 0.5 MG tablet Take 1 tablet (0.5 mg total) by mouth 3 (three) times daily as needed. for anxiety 270 tablet 0   aspirin  EC 81 MG tablet Take 1 tablet (81 mg total) by mouth daily. Swallow whole. 30 tablet 12   brimonidine  (ALPHAGAN  P) 0.1 % SOLN Place 1 drop into both eyes 4 (four) times daily.     clopidogrel  (PLAVIX ) 75 MG tablet Take 1 tablet (75 mg total) by mouth daily with breakfast. 90 tablet 3   dicyclomine  (BENTYL ) 10 MG capsule TAKE 1 CAPSULE BY MOUTH 4 TIMES DAILY. (BEFORE MEALS AND AT BEDTIME) 360 capsule 1   dorzolamide   (TRUSOPT ) 2 % ophthalmic solution Place 1 drop into both eyes 2 (two) times daily.     escitalopram  (LEXAPRO ) 20 MG tablet Take 1 tablet (20 mg total) by mouth at bedtime. 90 tablet 1   Evolocumab  (REPATHA  SURECLICK) 140 MG/ML SOAJ Inject 140 mg into the skin every 14 (fourteen) days. 6 mL 3   famotidine  (PEPCID ) 20 MG tablet Take 1 tablet by mouth twice daily 30 tablet 0   feeding supplement (ENSURE PLUS HIGH PROTEIN) LIQD Take 237 mLs by mouth 2 (two) times daily between meals. 2370 mL 0   furosemide  (LASIX ) 20 MG tablet Take 1 tablet (20 mg total) by mouth every morning. 90 tablet 3   isosorbide  mononitrate (IMDUR ) 30 MG 24 hr tablet Take 1 tablet (30 mg total) by mouth daily. 90 tablet 3   levothyroxine  (SYNTHROID ) 25 MCG tablet Take 1 tablet (25 mcg total) by mouth daily before breakfast. 90 tablet 1   Multiple Vitamin (MULTIVITAMIN WITH MINERALS) TABS tablet Take 1 tablet by mouth daily. 30 tablet 0   nystatin  (MYCOSTATIN /NYSTOP ) powder Apply 1 Application topically 3 (three) times daily. (Patient taking differently: Apply 1 Application topically daily as needed (dry skin).) 15 g 0   ondansetron  (ZOFRAN -ODT) 4 MG disintegrating tablet Take 1 tablet (4 mg total) by mouth every 8 (eight) hours as needed for nausea or vomiting. 20 tablet 0   polyethylene glycol powder (GLYCOLAX /MIRALAX )  17 GM/SCOOP powder Dissolve 1 capful (17g) in 4-8 ounces of liquid and take by mouth daily. 238 g 0   potassium chloride  SA (KLOR-CON  M) 20 MEQ tablet Take 1 tablet by mouth twice daily 180 tablet 3   senna (SENOKOT) 8.6 MG TABS tablet Take 1 tablet (8.6 mg total) by mouth daily. 120 tablet 0   SIMBRINZA 1-0.2 % SUSP Apply 1 drop to eye 2 (two) times daily.     simethicone  (MYLICON) 40 MG/0.6ML drops Take 0.6 mLs (40 mg total) by mouth every 6 (six) hours as needed for flatulence. 30 mL 0   traMADol  (ULTRAM ) 50 MG tablet Take 1 tablet (50 mg total) by mouth every 6 (six) hours as needed for moderate pain (pain  score 4-6). 15 tablet 0   VASCEPA  1 g capsule TAKE 2 CAPSULES BY MOUTH IN THE MORNING AND 2 AT BEDTIME 360 capsule 3   No facility-administered medications prior to visit.    ROS Review of Systems  Constitutional:  Negative for appetite change, chills, diaphoresis, fatigue and fever.  HENT:  Positive for nosebleeds, sore throat and voice change. Negative for facial swelling, sinus pressure, sinus pain and trouble swallowing.   Eyes:  Negative for visual disturbance.  Respiratory:  Positive for cough. Negative for chest tightness, shortness of breath and wheezing.   Cardiovascular:  Negative for chest pain, palpitations and leg swelling.  Gastrointestinal:  Positive for abdominal pain. Negative for blood in stool, constipation, diarrhea, nausea and vomiting.  Genitourinary: Negative.  Negative for difficulty urinating and hematuria.  Musculoskeletal: Negative.   Skin: Negative.  Negative for color change and rash.  Neurological:  Negative for dizziness.  Hematological:  Negative for adenopathy. Bruises/bleeds easily.  Psychiatric/Behavioral: Negative.      Objective:  BP (!) 142/74 (BP Location: Left Arm, Patient Position: Sitting, Cuff Size: Normal)   Pulse 70   Temp 98.2 F (36.8 C) (Oral)   Resp 16   Ht 5' 3 (1.6 m)   Wt 175 lb 9.6 oz (79.7 kg)   SpO2 97%   BMI 31.11 kg/m   BP Readings from Last 3 Encounters:  06/19/24 (!) 142/74  05/19/24 (!) 121/54  05/06/24 136/82    Wt Readings from Last 3 Encounters:  06/19/24 175 lb 9.6 oz (79.7 kg)  05/06/24 180 lb 6.4 oz (81.8 kg)  03/01/24 181 lb 3.2 oz (82.2 kg)    Physical Exam Vitals reviewed.  Constitutional:      General: She is not in acute distress.    Appearance: She is not ill-appearing, toxic-appearing or diaphoretic.  HENT:     Nose: Nose normal.     Mouth/Throat:     Mouth: Mucous membranes are moist.  Eyes:     General: No scleral icterus.    Conjunctiva/sclera: Conjunctivae normal.  Cardiovascular:      Rate and Rhythm: Normal rate and regular rhythm.     Heart sounds: No murmur heard.    No friction rub. No gallop.  Pulmonary:     Effort: Pulmonary effort is normal.     Breath sounds: No stridor. No wheezing, rhonchi or rales.  Abdominal:     General: Abdomen is flat. Bowel sounds are normal. There is no distension.     Palpations: Abdomen is soft. There is no hepatomegaly.     Tenderness: There is abdominal tenderness in the left lower quadrant. There is no guarding or rebound. Negative signs include Murphy's sign.  Musculoskeletal:  General: Normal range of motion.     Cervical back: Neck supple.     Right lower leg: No edema.     Left lower leg: No edema.  Lymphadenopathy:     Cervical: No cervical adenopathy.  Skin:    Findings: Bruising present.  Neurological:     General: No focal deficit present.     Mental Status: She is alert. Mental status is at baseline.  Psychiatric:        Mood and Affect: Mood normal.        Behavior: Behavior normal.     Lab Results  Component Value Date   WBC 2.7 (L) 06/19/2024   HGB 14.2 06/19/2024   HCT 42.1 06/19/2024   PLT 109.0 (L) 06/19/2024   GLUCOSE 99 06/19/2024   CHOL 138 05/06/2024   TRIG 86.0 05/06/2024   HDL 40.70 05/06/2024   LDLDIRECT 174.0 02/27/2019   LDLCALC 80 05/06/2024   ALT 26 05/18/2024   AST 38 05/18/2024   NA 138 06/19/2024   K 3.6 06/19/2024   CL 103 06/19/2024   CREATININE 0.68 06/19/2024   BUN 13 06/19/2024   CO2 27 06/19/2024   TSH 3.47 06/19/2024   INR 1.0 06/19/2024   HGBA1C 5.6 10/29/2020    CT ABDOMEN PELVIS W CONTRAST Result Date: 05/18/2024 CLINICAL DATA:  Abdominal pain, nausea, and vomiting EXAM: CT ABDOMEN AND PELVIS WITH CONTRAST TECHNIQUE: Multidetector CT imaging of the abdomen and pelvis was performed using the standard protocol following bolus administration of intravenous contrast. RADIATION DOSE REDUCTION: This exam was performed according to the departmental  dose-optimization program which includes automated exposure control, adjustment of the mA and/or kV according to patient size and/or use of iterative reconstruction technique. CONTRAST:  75mL OMNIPAQUE  IOHEXOL  350 MG/ML SOLN COMPARISON:  CT abdomen and pelvis dated 03/29/2023 FINDINGS: Lower chest: No focal consolidation or pulmonary nodule in the lung bases. No pleural effusion or pneumothorax demonstrated. Partially imaged heart size is normal. Coronary artery calcifications. Hepatobiliary: Mildly lobulated hepatic contour. Unchanged common bile duct dilation measuring 1.6 cm. No radiopaque stones. Cholecystectomy. Pancreas: No focal lesions or main ductal dilation. Spleen: Normal in size without focal abnormality. Adrenals/Urinary Tract: No adrenal nodules. Unchanged 1.0 x 1.0 cm posterior left lower renal enhancing lesion (3:39). No hydronephrosis or calculi. No focal bladder wall thickening. Stomach/Bowel: Small hiatal hernia. No evidence of bowel wall thickening, distention, or inflammatory changes. Colonic diverticulosis without acute diverticulitis. Dilated appendix measuring 12 mm demonstrates mural thickening and periappendiceal stranding. Vascular/Lymphatic: Aortic atherosclerosis. No enlarged abdominal or pelvic lymph nodes. Reproductive: No adnexal masses. Other: Small volume right lower quadrant and pelvic free fluid, which appears mildly hyperattenuating. Musculoskeletal: No acute or abnormal lytic or blastic osseous lesions. Multilevel degenerative changes of the partially imaged thoracic and lumbar spine. IMPRESSION: 1. Acute appendicitis with small volume right lower quadrant and pelvic free fluid, which appears mildly hyperattenuating, which may represent early perforation. No drainable fluid collection. 2. Unchanged 1.0 cm posterior left lower renal enhancing lesion, suspicious for renal cell carcinoma. 3. Unchanged common bile duct dilation measuring 1.6 cm, likely related to postcholecystectomy  state. 4.  Aortic Atherosclerosis (ICD10-I70.0). Electronically Signed   By: Limin  Xu M.D.   On: 05/18/2024 18:23   DG Abd 2 Views Result Date: 06/19/2024 EXAM: 2 VIEW XRAY OF THE ABDOMEN 06/19/2024 10:55:55 AM COMPARISON: Abdominal series dated 11/25/2015. CLINICAL HISTORY: abd pain FINDINGS: BOWEL: Nonobstructive bowel gas pattern. SOFT TISSUES: There are surgical clips in the gallbladder fossa. BONES: There  is mild levoscoliosis of the lumbar spine. No acute fracture. IMPRESSION: 1. No acute findings in the abdomen. 2. Surgical clips in the gallbladder fossa consistent with prior cholecystectomy. 3. Mild levoscoliosis of the lumbar spine. Electronically signed by: Evalene Coho MD 06/19/2024 11:48 AM EST RP Workstation: [id number]   DG Chest 2 View Result Date: 06/19/2024 CLINICAL DATA:  Cough for 5 days EXAM: CHEST - 2 VIEW COMPARISON:  June 19, 2023 FINDINGS: The heart size and mediastinal contours are within normal limits. Stable right midlung scarring. No acute pulmonary disease is noted. The visualized skeletal structures are unremarkable. IMPRESSION: No active cardiopulmonary disease. Electronically Signed   By: Lynwood Landy Raddle M.D.   On: 06/19/2024 09:44     DG Abd 2 Views Result Date: 06/19/2024 EXAM: 2 VIEW XRAY OF THE ABDOMEN 06/19/2024 10:55:55 AM COMPARISON: Abdominal series dated 11/25/2015. CLINICAL HISTORY: abd pain FINDINGS: BOWEL: Nonobstructive bowel gas pattern. SOFT TISSUES: There are surgical clips in the gallbladder fossa. BONES: There is mild levoscoliosis of the lumbar spine. No acute fracture. IMPRESSION: 1. No acute findings in the abdomen. 2. Surgical clips in the gallbladder fossa consistent with prior cholecystectomy. 3. Mild levoscoliosis of the lumbar spine. Electronically signed by: Evalene Coho MD 06/19/2024 11:48 AM EST RP Workstation: [id number]   DG Chest 2 View Result Date: 06/19/2024 CLINICAL DATA:  Cough for 5 days EXAM: CHEST - 2 VIEW  COMPARISON:  June 19, 2023 FINDINGS: The heart size and mediastinal contours are within normal limits. Stable right midlung scarring. No acute pulmonary disease is noted. The visualized skeletal structures are unremarkable. IMPRESSION: No active cardiopulmonary disease. Electronically Signed   By: Lynwood Landy Raddle M.D.   On: 06/19/2024 09:44     Assessment & Plan:  Primary hypertension- BP is adequately well controlled. -     Basic metabolic panel with GFR; Future -     Urinalysis, Routine w reflex microscopic; Future -     TSH; Future  Easy bruising- Low PLTs + plavix . -     CBC with Differential/Platelet; Future -     Protime-INR; Future -     APTT; Future  Subacute cough -     DG Chest 2 View; Future  Left lower quadrant abdominal tenderness without rebound tenderness- Exam, xray, labs are reassuring. -     Lipase; Future -     DG Abd 2 Views; Future  Viral URI with cough     Follow-up: No follow-ups on file.  Debby Molt, MD "

## 2024-06-25 ENCOUNTER — Ambulatory Visit: Admitting: Internal Medicine

## 2024-06-27 ENCOUNTER — Encounter: Payer: Self-pay | Admitting: Internal Medicine

## 2024-07-03 ENCOUNTER — Telehealth: Payer: Self-pay | Admitting: Pharmacist

## 2024-07-03 NOTE — Progress Notes (Signed)
 Received phone call from pt's daughter, Theoplis, stating the pharmacy is not able to use the grant card for her Rep[facility] . We had talked in November regarding re-enrolling in the grant through [facility] and MyChart message had been sent with the new card information. Theoplis was able to pull up the MyChart message but was unable to read the needed information.   New MyChart message sent with the information typed for her to provide to the pharmacy.  Darrelyn Drum, PharmD, BCPS, CPP Clinical Pharmacist Practitioner New  Primary Care at [facility] [phone]

## 2024-07-05 ENCOUNTER — Ambulatory Visit

## 2024-07-05 VITALS — BP 144/74 | HR 68 | Ht 62.5 in | Wt 179.4 lb

## 2024-07-05 DIAGNOSIS — Z0000 Encounter for general adult medical examination without abnormal findings: Secondary | ICD-10-CM

## 2024-07-05 NOTE — Progress Notes (Signed)
 "  Chief Complaint  Patient presents with   Medicare Wellness     Subjective:   [name] is a 84 y.o. female who presents for a Medicare Annual Wellness Visit.  Visit info / Clinical Intake: Medicare Wellness Visit Type:: Subsequent Annual Wellness Visit Persons participating in visit and providing information:: patient Medicare Wellness Visit Mode:: In-person (required for WTM) Interpreter Needed?: No Pre-visit prep was completed: yes AWV questionnaire completed by patient prior to visit?: no Living arrangements:: (!) lives alone Patient's Overall Health Status Rating: excellent Typical amount of pain: none Does pain affect daily life?: no Are you currently prescribed opioids?: no  Dietary Habits and Nutritional Risks How many meals a day?: 3 Eats fruit and vegetables daily?: yes Most meals are obtained by: preparing own meals In the last 2 weeks, have you had any of the following?: none Diabetic:: no  Functional Status Activities of Daily Living (to include ambulation/medication): Independent Ambulation: Independent with device- listed below Home Assistive Devices/Equipment: Eyeglasses Medication Administration: Independent Home Management (perform basic housework or laundry): Independent Manage your own finances?: yes Primary transportation is: driving Concerns about vision?: no *vision screening is required for WTM* Concerns about hearing?: no  Fall Screening Falls in the past year?: 0 Number of falls in past year: 0 Was there an injury with Fall?: 0 Fall Risk Category Calculator: 0 Patient Fall Risk Level: Low Fall Risk  Fall Risk Patient at Risk for Falls Due to: No Fall Risks Fall risk Follow up: Falls evaluation completed; Falls prevention discussed  Home and Transportation Safety: All stairs or steps have railings?: (!) no (2 steps going into den) Grab bars in the bathtub or shower?: (!) no (walkin shower) Have non-skid surface in bathtub or  shower?: yes Good home lighting?: yes Regular seat belt use?: yes Hospital stays in the last year:: (!) yes How many hospital stays:: 1 Reason: appendix  Cognitive Assessment Difficulty concentrating, remembering, or making decisions? : no Will 6CIT or Mini Cog be Completed: no 6CIT or Mini Cog Declined: patient alert, oriented, able to answer questions appropriately and recall recent events  Advance Directives (For Healthcare) Does Patient Have a Medical Advance Directive?: Yes Type of Advance Directive: Healthcare Power of [place]; Living will; Out of facility DNR (pink MOST or yellow form) Copy of Healthcare Power of Attorney in Chart?: No - copy requested Copy of Living Will in Chart?: No - copy requested Out of facility DNR (pink MOST or yellow form) in Chart? (Ambulatory ONLY): No - copy requested Would patient like information on creating a medical advance directive?: No - Patient declined  Reviewed/Updated  Reviewed/Updated: Reviewed All (Medical, Surgical, Family, Medications, Allergies, Care Teams, Patient Goals)    Allergies (verified) Diltiazem hcl, Statins, Zetia  [ezetimibe ], Codeine, Sulfonamide derivatives, Tape, Protonix  [pantoprazole ], and Tramadol    Current Medications (verified) Outpatient Encounter Medications as of 07/05/2024  Medication Sig   acetaminophen  (TYLENOL ) 500 MG tablet Take 2 tablets (1,000 mg total) by mouth every 6 (six) hours as needed.   ALPRAZolam  (XANAX ) 0.5 MG tablet Take 1 tablet (0.5 mg total) by mouth 3 (three) times daily as needed. for anxiety   aspirin  EC 81 MG tablet Take 1 tablet (81 mg total) by mouth daily. Swallow whole.   brimonidine  (ALPHAGAN  P) 0.1 % SOLN Place 1 drop into both eyes 4 (four) times daily.   clopidogrel  (PLAVIX ) 75 MG tablet Take 1 tablet (75 mg total) by mouth daily with breakfast.   dicyclomine  (BENTYL ) 10 MG capsule TAKE  1 CAPSULE BY MOUTH 4 TIMES DAILY. (BEFORE MEALS AND AT BEDTIME)   dorzolamide  (TRUSOPT ) 2  % ophthalmic solution Place 1 drop into both eyes 2 (two) times daily.   escitalopram  (LEXAPRO ) 20 MG tablet Take 1 tablet (20 mg total) by mouth at bedtime.   Evolocumab  (REPATHA  SURECLICK) 140 MG/ML SOAJ Inject 140 mg into the skin every 14 (fourteen) days.   famotidine  (PEPCID ) 20 MG tablet Take 1 tablet by mouth twice daily   feeding supplement (ENSURE PLUS HIGH PROTEIN) LIQD Take 237 mLs by mouth 2 (two) times daily between meals.   furosemide  (LASIX ) 20 MG tablet Take 1 tablet (20 mg total) by mouth every morning.   isosorbide  mononitrate (IMDUR ) 30 MG 24 hr tablet Take 1 tablet (30 mg total) by mouth daily.   levothyroxine  (SYNTHROID ) 25 MCG tablet Take 1 tablet (25 mcg total) by mouth daily before breakfast.   Multiple Vitamin (MULTIVITAMIN WITH MINERALS) TABS tablet Take 1 tablet by mouth daily.   nystatin  (MYCOSTATIN /NYSTOP ) powder Apply 1 Application topically 3 (three) times daily. (Patient taking differently: Apply 1 Application topically daily as needed (dry skin).)   ondansetron  (ZOFRAN -ODT) 4 MG disintegrating tablet Take 1 tablet (4 mg total) by mouth every 8 (eight) hours as needed for nausea or vomiting.   polyethylene glycol powder (GLYCOLAX /MIRALAX ) 17 GM/SCOOP powder Dissolve 1 capful (17g) in 4-8 ounces of liquid and take by mouth daily.   potassium chloride  SA (KLOR-CON  M) 20 MEQ tablet Take 1 tablet by mouth twice daily   senna (SENOKOT) 8.6 MG TABS tablet Take 1 tablet (8.6 mg total) by mouth daily.   SIMBRINZA 1-0.2 % SUSP Apply 1 drop to eye 2 (two) times daily.   simethicone  (MYLICON) 40 MG/0.6ML drops Take 0.6 mLs (40 mg total) by mouth every 6 (six) hours as needed for flatulence.   traMADol  (ULTRAM ) 50 MG tablet Take 1 tablet (50 mg total) by mouth every 6 (six) hours as needed for moderate pain (pain score 4-6).   VASCEPA  1 g capsule TAKE 2 CAPSULES BY MOUTH IN THE MORNING AND 2 AT BEDTIME   No facility-administered encounter medications on file as of 07/05/2024.     History: Past Medical History:  Diagnosis Date   Anxiety state, unspecified    CAD (coronary artery disease)    Disorder of bone and cartilage, unspecified    Diverticulosis of colon (without mention of hemorrhage)    Dizziness and giddiness    Fatty liver    Fibromyalgia    Obesity, unspecified    Other and unspecified hyperlipidemia    Other chronic nonalcoholic liver disease    Skin cancer    basal cell on face   Stroke (HCC)    TIA   TIA (transient ischemic attack)    Unspecified essential hypertension    Past Surgical History:  Procedure Laterality Date   BACK SURGERY     CHOLECYSTECTOMY     CORONARY STENT INTERVENTION N/A 06/06/2023   Procedure: CORONARY STENT INTERVENTION;  Surgeon: Jordan, Peter M, MD;  Location: [facility] INVASIVE CV LAB;  Service: Cardiovascular;  Laterality: N/A;   LAPAROSCOPIC APPENDECTOMY N/A 05/18/2024   Procedure: APPENDECTOMY, LAPAROSCOPIC;  Surgeon: Sebastian Moles, MD;  Location: [facility] OR;  Service: General;  Laterality: N/A;   LEFT HEART CATH AND CORONARY ANGIOGRAPHY N/A 06/06/2023   Procedure: LEFT HEART CATH AND CORONARY ANGIOGRAPHY;  Surgeon: Jordan, Peter M, MD;  Location: [facility] INVASIVE CV LAB;  Service: Cardiovascular;  Laterality: N/A;   MOHS SURGERY  TONSILLECTOMY AND ADENOIDECTOMY     TOTAL ABDOMINAL HYSTERECTOMY W/ BILATERAL SALPINGOOPHORECTOMY     Family History  Problem Relation Age of Onset   Stomach cancer Mother    Cancer Brother    Breast cancer Sister    Other Father    Breast cancer Sister    Colon cancer Neg Hx    Social History   Occupational History    Employer: RETIRED  Tobacco Use   Smoking status: Never   Smokeless tobacco: Never  Vaping Use   Vaping status: Never Used  Substance and Sexual Activity   Alcohol use: No   Drug use: No   Sexual activity: Not Currently   Tobacco Counseling Counseling given: Not Answered  SDOH Screenings   Food Insecurity: No Food Insecurity (07/05/2024)  Housing: Unknown  (07/05/2024)  Transportation Needs: No Transportation Needs (07/05/2024)  Utilities: Not At Risk (07/05/2024)  Alcohol Screen: Low Risk (05/23/2023)  Depression (PHQ2-9): Low Risk (07/05/2024)  Financial Resource Strain: Low Risk (05/23/2023)  Physical Activity: Inactive (07/05/2024)  Social Connections: Moderately Integrated (07/05/2024)  Recent Concern: Social Connections - Moderately Isolated (05/18/2024)  Stress: No Stress Concern Present (07/05/2024)  Tobacco Use: Low Risk (07/05/2024)  Health Literacy: Adequate Health Literacy (07/05/2024)   See flowsheets for full screening details  Depression Screen PHQ 2 & 9 Depression Scale- Over the past 2 weeks, how often have you been bothered by any of the following problems? Little interest or pleasure in doing things: 0 Feeling down, depressed, or hopeless (PHQ Adolescent also includes...irritable): 0 PHQ-2 Total Score: 0 Trouble falling or staying asleep, or sleeping too much: 0 Feeling tired or having little energy: 0 Poor appetite or overeating (PHQ Adolescent also includes...weight loss): 0 Feeling bad about yourself - or that you are a failure or have let yourself or your family down: 0 Trouble concentrating on things, such as reading the newspaper or watching television (PHQ Adolescent also includes...like school work): 0 Moving or speaking so slowly that other people could have noticed. Or the opposite - being so fidgety or restless that you have been moving around a lot more than usual: 0 Thoughts that you would be better off dead, or of hurting yourself in some way: 0 PHQ-9 Total Score: 0 If you checked off any problems, how difficult have these problems made it for you to do your work, take care of things at home, or get along with other people?: Not difficult at all     Goals Addressed             This Visit's Progress    Continue to take time for myself daily   On track    I will continue to read the bible and enjoy cooking  daily. Enjoy life, family and worship God.              Objective:    Today's Vitals   07/05/24 1057 07/05/24 1100  BP: (!) 150/80 (!) 144/74  Pulse: 68   SpO2: 98%   Weight: 179 lb 6.4 oz (81.4 kg)   Height: 5' 2.5 (1.588 m)    Body mass index is 32.29 kg/m.  Hearing/Vision screen Hearing Screening - Comments:: Denies hearing difficulties   Vision Screening - Comments:: Wears eyeglassess/UTD/Dr. Waylan Immunizations and Health Maintenance Health Maintenance  Topic Date Due   COVID-19 Vaccine (4 - 2025-26 season) 02/19/2024   Colonoscopy  10/30/2024 (Originally 05/02/2021)   Medicare Annual Wellness (AWV)  07/05/2025   DTaP/Tdap/Td (3 - Td or  Tdap) 10/05/2027   Pneumococcal Vaccine: 50+ Years  Completed   Influenza Vaccine  Completed   Zoster Vaccines- Shingrix  Completed   Meningococcal B Vaccine  Aged Out   Mammogram  Discontinued   Bone Density Scan  Discontinued   Hepatitis C Screening  Discontinued        Assessment/Plan:  This is a routine wellness examination for [name].  Patient Care Team: Joshua Debby CROME, MD as PCP - General (Internal Medicine) Thukkani, Arun K, MD as PCP - Cardiology (Cardiology) Waylan Cain, MD as Consulting Physician (Ophthalmology) Merceda Lela SAUNDERS, RPH-CPP (Pharmacist)  I have personally reviewed and noted the following in the patients chart:   Medical and social history Use of alcohol, tobacco or illicit drugs  Current medications and supplements including opioid prescriptions. Functional ability and status Nutritional status Physical activity Advanced directives List of other physicians Hospitalizations, surgeries, and ER visits in previous 12 months Vitals Screenings to include cognitive, depression, and falls Referrals and appointments  No orders of the defined types were placed in this encounter.  In addition, I have reviewed and discussed with patient certain preventive protocols, quality metrics, and  best practice recommendations. A written personalized care plan for preventive services as well as general preventive health recommendations were provided to patient.   Andreus Cure L Mekenna Finau, CMA   07/05/2024   Return in 1 year (on 07/05/2025).  After Visit Summary: (MyChart) Due to this being a telephonic visit, the after visit summary with patients personalized plan was offered to patient via MyChart   Nurse Notes: Patient has no concerns today.  She will discussed if she is to get another colonoscopy during her next office visit with Dr. Joshua.   "

## 2024-07-05 NOTE — Patient Instructions (Addendum)
 [name],  Thank you for taking the time for your Medicare Wellness Visit. I appreciate your continued commitment to your health goals. Please review the care plan we discussed, and feel free to reach out if I can assist you further.  Please note that Annual Wellness Visits do not include a physical exam. Some assessments may be limited, especially if the visit was conducted virtually. If needed, we may recommend an in-person follow-up with your provider.  Ongoing Care Seeing your primary care provider every 3 to 6 months helps us  monitor your health and provide consistent, personalized care. Next office visit on 11/04/2024.  Remember to discuss if you are due to have another colonoscopy this year during your next office visit.  Each day, aim for 6 glasses of water, plenty of protein in your diet and try to get up and walk/ stretch every hour for 5-10 minutes at a time.     Referrals If a referral was made during today's visit and you haven't received any updates within two weeks, please contact the referred provider directly to check on the status.  Recommended Screenings:  Health Maintenance  Topic Date Due   COVID-19 Vaccine (4 - 2025-26 season) 02/19/2024   Medicare Annual Wellness Visit  05/22/2024   Colon Cancer Screening  10/30/2024*   DTaP/Tdap/Td vaccine (3 - Td or Tdap) 10/05/2027   Pneumococcal Vaccine for age over 76  Completed   Flu Shot  Completed   Zoster (Shingles) Vaccine  Completed   Meningitis B Vaccine  Aged Out   Breast Cancer Screening  Discontinued   Osteoporosis screening with Bone Density Scan  Discontinued   Hepatitis C Screening  Discontinued  *Topic was postponed. The date shown is not the original due date.       07/05/2024   10:45 AM  Advanced Directives  Does Patient Have a Medical Advance Directive? Yes  Type of Estate Agent of [place];Living will;Out of facility DNR (pink MOST or yellow form)  Copy of Healthcare Power of  Attorney in Chart? No - copy requested    Vision: Annual vision screenings are recommended for early detection of glaucoma, cataracts, and diabetic retinopathy. These exams can also reveal signs of chronic conditions such as diabetes and high blood pressure.  Dental: Annual dental screenings help detect early signs of oral cancer, gum disease, and other conditions linked to overall health, including heart disease and diabetes.  Please see the attached documents for additional preventive care recommendations.

## 2024-07-26 ENCOUNTER — Ambulatory Visit (INDEPENDENT_AMBULATORY_CARE_PROVIDER_SITE_OTHER): Admitting: Otolaryngology

## 2024-07-26 ENCOUNTER — Encounter (INDEPENDENT_AMBULATORY_CARE_PROVIDER_SITE_OTHER): Payer: Self-pay | Admitting: Otolaryngology

## 2024-07-26 MED ORDER — PREDNISONE 10 MG (21) PO TBPK: ORAL_TABLET | ORAL | 0 refills | Status: AC

## 2024-07-26 MED ORDER — MUPIROCIN 2 % EX OINT: 1.0000 | TOPICAL_OINTMENT | Freq: Three times a day (TID) | CUTANEOUS | 2 refills | Status: AC

## 2024-07-26 NOTE — Progress Notes (Unsigned)
 Patient ID: [name], female   DOB: [birth year], 84 y.o.   MRN: [id number]  Follow up: Eustachian tube dysfunction, nasal sore History of Present Illness [name] is an 84 year old female with glaucoma who presents for evaluation of persistent bilateral ear fullness and a chronic nasal sore.  Over the past six months, she has experienced persistent bilateral ear fullness, described as her ears feeling stopped up. She is unable to perform Valsalva maneuvers to relieve the sensation, a technique she previously used successfully. At one point, she had complete hearing loss in one ear, which has since improved. She denies significant cerumen impaction and attributes her symptoms to Eustachian tube dysfunction. She has not used steroid nasal sprays and has previously used oral prednisone . Despite prior attempts, she continues to have difficulty equalizing pressure in both ears.  For the past two months, she has had a painful sore at the superior aspect of the nasal cavity, occasionally associated with epistaxis, particularly in the winter when the air is dry. She has not used topical antibiotic ointments for this lesion and uses a humidifier at night to alleviate nasal dryness.  She endorses chronic allergy symptoms, describing her environment as persistently bothersome.     Exam: General: Communicates without difficulty, well nourished, no acute distress. Head: Normocephalic, no evidence injury, no tenderness, facial buttresses intact without stepoff. Face/sinus: No tenderness to palpation and percussion. Facial movement is normal and symmetric. Eyes: PERRL, EOMI. No scleral icterus, conjunctivae clear. Neuro: CN II exam reveals vision grossly intact.  No nystagmus at any point of gaze. Ears: Auricles well formed without lesions.  Ear canals are intact without mass or lesion.  No erythema or edema is appreciated.  The TMs are intact without fluid. Nose: External evaluation reveals normal  support and skin without lesions.  Dorsum is intact.  Anterior rhinoscopy reveals congested mucosa over anterior aspect of inferior turbinates and intact septum.  No purulence noted. Oral:  Oral cavity and oropharynx are intact, symmetric, without erythema or edema.  Mucosa is moist without lesions. Neck: Full range of motion without pain.  There is no significant lymphadenopathy.  No masses palpable.  Thyroid  bed within normal limits to palpation.  Parotid glands and submandibular glands equal bilaterally without mass.  Trachea is midline. Neuro:  CN 2-12 grossly intact.    Assessment and Plan Assessment & Plan Nasal impetigo Chronic painful nasal lesion at the superior aspect of the nasal cavity, consistent with nasal impetigo likely secondary to inflamed hair follicles. The lesion intermittently bleeds and is exacerbated by dry winter conditions. Improvement is anticipated with topical antibiotic therapy and humidification. - Prescribed Bactroban  ointment to be applied with a cotton-tipped applicator to the affected area three times daily for one week. - Recommended nocturnal humidifier use to reduce mucosal dryness and epistaxis. - Provided anticipatory guidance that resolution is expected within two weeks; instructed her to return if symptoms persist.  Bilateral eustachian tube dysfunction Persistent bilateral aural fullness and inability to perform Valsalva maneuver, refractory to prior interventions. Prednisone  was recommended to reduce inflammation and improve Eustachian tube patency. Improvement is expected following corticosteroid therapy. - Prescribed prednisone  to address Eustachian tube inflammation. - Instructed her to continue Valsalva maneuvers to facilitate Eustachian tube opening. - Provided anticipatory guidance regarding expected improvement post-prednisone ; advised follow-up if symptoms persist.  Chronic rhinitis Chronic nasal congestion and allergic symptoms with mucosal  congestion and inferior turbinate hypertrophy. Steroid nasal sprays are contraindicated due to her glaucoma. - Discussed contraindication  of steroid nasal sprays due to glaucoma.

## 2024-11-04 ENCOUNTER — Ambulatory Visit: Admitting: Internal Medicine

## 2025-01-31 ENCOUNTER — Ambulatory Visit (INDEPENDENT_AMBULATORY_CARE_PROVIDER_SITE_OTHER): Admitting: Otolaryngology
